# Patient Record
Sex: Male | Born: 1940 | Race: Black or African American | Hispanic: No | Marital: Married | Smoking: Former smoker
Health system: Southern US, Community
[De-identification: ages and names within clinical notes are randomized; demographics above are authoritative.]

## PROBLEM LIST (undated history)

## (undated) DIAGNOSIS — I4819 Other persistent atrial fibrillation: Secondary | ICD-10-CM

## (undated) DIAGNOSIS — F329 Major depressive disorder, single episode, unspecified: Secondary | ICD-10-CM

## (undated) DIAGNOSIS — Z950 Presence of cardiac pacemaker: Secondary | ICD-10-CM

## (undated) DIAGNOSIS — F3289 Other specified depressive episodes: Secondary | ICD-10-CM

## (undated) DIAGNOSIS — K219 Gastro-esophageal reflux disease without esophagitis: Secondary | ICD-10-CM

## (undated) DIAGNOSIS — J986 Disorders of diaphragm: Secondary | ICD-10-CM

## (undated) DIAGNOSIS — Z79899 Other long term (current) drug therapy: Secondary | ICD-10-CM

## (undated) DIAGNOSIS — I509 Heart failure, unspecified: Secondary | ICD-10-CM

## (undated) DIAGNOSIS — M545 Low back pain, unspecified: Secondary | ICD-10-CM

## (undated) DIAGNOSIS — K5730 Diverticulosis of large intestine without perforation or abscess without bleeding: Secondary | ICD-10-CM

## (undated) DIAGNOSIS — K621 Rectal polyp: Secondary | ICD-10-CM

## (undated) DIAGNOSIS — I2510 Atherosclerotic heart disease of native coronary artery without angina pectoris: Secondary | ICD-10-CM

## (undated) DIAGNOSIS — J309 Allergic rhinitis, unspecified: Secondary | ICD-10-CM

## (undated) DIAGNOSIS — R1013 Epigastric pain: Secondary | ICD-10-CM

## (undated) DIAGNOSIS — I495 Sick sinus syndrome: Secondary | ICD-10-CM

## (undated) DIAGNOSIS — Z7901 Long term (current) use of anticoagulants: Secondary | ICD-10-CM

## (undated) DIAGNOSIS — N400 Enlarged prostate without lower urinary tract symptoms: Secondary | ICD-10-CM

## (undated) DIAGNOSIS — I10 Essential (primary) hypertension: Secondary | ICD-10-CM

## (undated) DIAGNOSIS — M1990 Unspecified osteoarthritis, unspecified site: Secondary | ICD-10-CM

## (undated) DIAGNOSIS — E785 Hyperlipidemia, unspecified: Secondary | ICD-10-CM

## (undated) HISTORY — DX: Other specified depressive episodes: F32.89

## (undated) HISTORY — DX: Low back pain: M54.5

## (undated) HISTORY — DX: Epigastric pain: R10.13

## (undated) HISTORY — PX: PACEMAKER PLACEMENT: SHX43

## (undated) HISTORY — DX: Other persistent atrial fibrillation: I48.19

## (undated) HISTORY — DX: Essential (primary) hypertension: I10

## (undated) HISTORY — DX: Hyperlipidemia, unspecified: E78.5

## (undated) HISTORY — DX: Other long term (current) drug therapy: Z79.899

## (undated) HISTORY — DX: Diverticulosis of large intestine without perforation or abscess without bleeding: K57.30

## (undated) HISTORY — DX: Low back pain, unspecified: M54.50

## (undated) HISTORY — DX: Benign prostatic hyperplasia without lower urinary tract symptoms: N40.0

## (undated) HISTORY — DX: Rectal polyp: K62.1

## (undated) HISTORY — DX: Atherosclerotic heart disease of native coronary artery without angina pectoris: I25.10

## (undated) HISTORY — DX: Gastro-esophageal reflux disease without esophagitis: K21.9

## (undated) HISTORY — DX: Disorders of diaphragm: J98.6

## (undated) HISTORY — DX: Allergic rhinitis, unspecified: J30.9

## (undated) HISTORY — DX: Long term (current) use of anticoagulants: Z79.01

## (undated) HISTORY — DX: Major depressive disorder, single episode, unspecified: F32.9

## (undated) HISTORY — DX: Sick sinus syndrome: I49.5

---

## 1998-05-29 ENCOUNTER — Emergency Department (HOSPITAL_COMMUNITY): Admission: EM | Admit: 1998-05-29 | Discharge: 1998-05-29 | Payer: Self-pay | Admitting: Emergency Medicine

## 2004-05-26 ENCOUNTER — Inpatient Hospital Stay (HOSPITAL_COMMUNITY): Admission: EM | Admit: 2004-05-26 | Discharge: 2004-05-30 | Payer: Self-pay | Admitting: Emergency Medicine

## 2004-05-27 ENCOUNTER — Encounter (INDEPENDENT_AMBULATORY_CARE_PROVIDER_SITE_OTHER): Payer: Self-pay | Admitting: *Deleted

## 2004-05-28 ENCOUNTER — Encounter: Payer: Self-pay | Admitting: Cardiology

## 2004-07-30 ENCOUNTER — Ambulatory Visit (HOSPITAL_COMMUNITY): Admission: RE | Admit: 2004-07-30 | Discharge: 2004-07-30 | Payer: Self-pay | Admitting: Internal Medicine

## 2005-08-05 ENCOUNTER — Ambulatory Visit: Payer: Self-pay | Admitting: Internal Medicine

## 2006-10-25 ENCOUNTER — Ambulatory Visit: Payer: Self-pay | Admitting: Internal Medicine

## 2006-10-25 LAB — CONVERTED CEMR LAB
AST: 23 units/L (ref 0–37)
Alkaline Phosphatase: 57 units/L (ref 39–117)
BUN: 14 mg/dL (ref 6–23)
Basophils Relative: 0.9 % (ref 0.0–1.0)
Bilirubin Urine: NEGATIVE
CO2: 29 meq/L (ref 19–32)
Calcium: 9 mg/dL (ref 8.4–10.5)
Chloride: 108 meq/L (ref 96–112)
Cholesterol: 169 mg/dL (ref 0–200)
Eosinophil percent: 1.8 % (ref 0.0–5.0)
Glomerular Filtration Rate, Af Am: 126 mL/min/{1.73_m2}
Glucose, Bld: 86 mg/dL (ref 70–99)
Hemoglobin, Urine: NEGATIVE
Hemoglobin: 14.9 g/dL (ref 13.0–17.0)
LDL Cholesterol: 101 mg/dL — ABNORMAL HIGH (ref 0–99)
Lymphocytes Relative: 32.3 % (ref 12.0–46.0)
Monocytes Absolute: 0.7 10*3/uL (ref 0.2–0.7)
Neutro Abs: 3.3 10*3/uL (ref 1.4–7.7)
PSA: 0.6 ng/mL (ref 0.10–4.00)
Platelets: 204 10*3/uL (ref 150–400)
RBC: 5 M/uL (ref 4.22–5.81)
TSH: 0.89 microintl units/mL (ref 0.35–5.50)
Total Bilirubin: 0.9 mg/dL (ref 0.3–1.2)
Triglyceride fasting, serum: 31 mg/dL (ref 0–149)
VLDL: 6 mg/dL (ref 0–40)
pH: 7 (ref 5.0–8.0)

## 2007-11-15 ENCOUNTER — Ambulatory Visit: Payer: Self-pay | Admitting: Internal Medicine

## 2007-11-15 ENCOUNTER — Telehealth: Payer: Self-pay | Admitting: Internal Medicine

## 2007-11-29 ENCOUNTER — Ambulatory Visit: Payer: Self-pay | Admitting: Internal Medicine

## 2007-11-29 ENCOUNTER — Encounter: Payer: Self-pay | Admitting: Internal Medicine

## 2008-05-30 ENCOUNTER — Ambulatory Visit: Payer: Self-pay | Admitting: Internal Medicine

## 2008-05-30 DIAGNOSIS — J309 Allergic rhinitis, unspecified: Secondary | ICD-10-CM | POA: Insufficient documentation

## 2008-05-30 DIAGNOSIS — N318 Other neuromuscular dysfunction of bladder: Secondary | ICD-10-CM | POA: Insufficient documentation

## 2008-05-30 DIAGNOSIS — K219 Gastro-esophageal reflux disease without esophagitis: Secondary | ICD-10-CM | POA: Insufficient documentation

## 2008-05-30 DIAGNOSIS — F329 Major depressive disorder, single episode, unspecified: Secondary | ICD-10-CM

## 2008-05-30 DIAGNOSIS — M545 Low back pain, unspecified: Secondary | ICD-10-CM | POA: Insufficient documentation

## 2008-05-30 DIAGNOSIS — K5730 Diverticulosis of large intestine without perforation or abscess without bleeding: Secondary | ICD-10-CM | POA: Insufficient documentation

## 2008-05-30 DIAGNOSIS — I10 Essential (primary) hypertension: Secondary | ICD-10-CM | POA: Insufficient documentation

## 2008-05-30 DIAGNOSIS — F32A Depression, unspecified: Secondary | ICD-10-CM | POA: Insufficient documentation

## 2008-05-30 DIAGNOSIS — E785 Hyperlipidemia, unspecified: Secondary | ICD-10-CM | POA: Insufficient documentation

## 2008-05-31 LAB — CONVERTED CEMR LAB
AST: 23 units/L (ref 0–37)
BUN: 13 mg/dL (ref 6–23)
Basophils Relative: 0 % (ref 0.0–1.0)
Calcium: 9.4 mg/dL (ref 8.4–10.5)
Cholesterol: 123 mg/dL (ref 0–200)
Eosinophils Absolute: 0.1 10*3/uL (ref 0.0–0.7)
Eosinophils Relative: 1.5 % (ref 0.0–5.0)
GFR calc Af Amer: 125 mL/min
GFR calc non Af Amer: 103 mL/min
Glucose, Bld: 90 mg/dL (ref 70–99)
HCT: 42.9 % (ref 39.0–52.0)
Hemoglobin, Urine: NEGATIVE
LDL Cholesterol: 67 mg/dL (ref 0–99)
Monocytes Absolute: 0.7 10*3/uL (ref 0.1–1.0)
Platelets: 200 10*3/uL (ref 150–400)
Potassium: 3.7 meq/L (ref 3.5–5.1)
RBC: 4.79 M/uL (ref 4.22–5.81)
Sodium: 143 meq/L (ref 135–145)
Specific Gravity, Urine: >=1.03 (ref 1.000–1.03)
Squamous Epithelial / LPF: NEGATIVE /lpf
TSH: 0.85 microintl units/mL (ref 0.35–5.50)
Total CHOL/HDL Ratio: 2.5
Urobilinogen, UA: 1 (ref 0.0–1.0)
pH: 6 (ref 5.0–8.0)

## 2008-07-06 ENCOUNTER — Ambulatory Visit: Payer: Self-pay | Admitting: Internal Medicine

## 2008-07-06 LAB — CONVERTED CEMR LAB: PSA: 0.96 ng/mL (ref 0.10–4.00)

## 2008-09-10 ENCOUNTER — Telehealth: Payer: Self-pay | Admitting: Internal Medicine

## 2009-02-12 ENCOUNTER — Telehealth: Payer: Self-pay | Admitting: Internal Medicine

## 2009-05-07 ENCOUNTER — Telehealth (INDEPENDENT_AMBULATORY_CARE_PROVIDER_SITE_OTHER): Payer: Self-pay | Admitting: *Deleted

## 2009-05-16 DIAGNOSIS — I495 Sick sinus syndrome: Secondary | ICD-10-CM

## 2009-05-16 HISTORY — DX: Sick sinus syndrome: I49.5

## 2009-05-16 HISTORY — PX: INSERT / REPLACE / REMOVE PACEMAKER: SUR710

## 2009-05-20 ENCOUNTER — Encounter: Payer: Self-pay | Admitting: Internal Medicine

## 2009-05-27 ENCOUNTER — Ambulatory Visit: Payer: Self-pay | Admitting: Internal Medicine

## 2009-05-27 DIAGNOSIS — R5381 Other malaise: Secondary | ICD-10-CM | POA: Insufficient documentation

## 2009-05-27 DIAGNOSIS — I498 Other specified cardiac arrhythmias: Secondary | ICD-10-CM | POA: Insufficient documentation

## 2009-05-27 DIAGNOSIS — R5383 Other fatigue: Secondary | ICD-10-CM | POA: Insufficient documentation

## 2009-05-27 LAB — CONVERTED CEMR LAB
AST: 26 units/L (ref 0–37)
Alkaline Phosphatase: 64 units/L (ref 39–117)
BUN: 13 mg/dL (ref 6–23)
CO2: 28 meq/L (ref 19–32)
Calcium: 9.4 mg/dL (ref 8.4–10.5)
Creatinine, Ser: 0.8 mg/dL (ref 0.4–1.5)
Eosinophils Relative: 1.1 % (ref 0.0–5.0)
GFR calc non Af Amer: 124.34 mL/min (ref >60)
Glucose, Bld: 103 mg/dL — ABNORMAL HIGH (ref 70–99)
HDL: 61.3 mg/dL (ref >39.00)
LDL Cholesterol: 102 mg/dL — ABNORMAL HIGH (ref 0–99)
Lymphocytes Relative: 26.7 % (ref 12.0–46.0)
Lymphs Abs: 1.9 10*3/uL (ref 0.7–4.0)
MCV: 91.7 fL (ref 78.0–100.0)
Neutrophils Relative %: 63.1 % (ref 43.0–77.0)
Nitrite: NEGATIVE
Platelets: 174 10*3/uL (ref 150.0–400.0)
Sodium: 143 meq/L (ref 135–145)
Total CHOL/HDL Ratio: 3
Total Protein, Urine: NEGATIVE mg/dL
Total Protein: 7.3 g/dL (ref 6.0–8.3)
Urine Glucose: NEGATIVE mg/dL
Urobilinogen, UA: 1 (ref 0.0–1.0)
WBC: 7.2 10*3/uL (ref 4.5–10.5)
pH: 6 (ref 5.0–8.0)

## 2009-05-30 ENCOUNTER — Telehealth: Payer: Self-pay | Admitting: Internal Medicine

## 2009-06-01 ENCOUNTER — Ambulatory Visit: Payer: Self-pay | Admitting: Radiology

## 2009-06-01 ENCOUNTER — Inpatient Hospital Stay (HOSPITAL_COMMUNITY): Admission: EM | Admit: 2009-06-01 | Discharge: 2009-06-04 | Payer: Self-pay | Admitting: Emergency Medicine

## 2009-06-01 ENCOUNTER — Encounter: Payer: Self-pay | Admitting: Emergency Medicine

## 2009-06-01 ENCOUNTER — Ambulatory Visit: Payer: Self-pay | Admitting: *Deleted

## 2009-06-03 ENCOUNTER — Encounter: Payer: Self-pay | Admitting: Cardiovascular Disease

## 2009-06-04 ENCOUNTER — Telehealth: Payer: Self-pay | Admitting: Internal Medicine

## 2009-06-05 ENCOUNTER — Ambulatory Visit: Payer: Self-pay | Admitting: Gastroenterology

## 2009-06-05 DIAGNOSIS — R634 Abnormal weight loss: Secondary | ICD-10-CM | POA: Insufficient documentation

## 2009-06-05 DIAGNOSIS — Z860100 Personal history of colon polyps, unspecified: Secondary | ICD-10-CM | POA: Insufficient documentation

## 2009-06-05 DIAGNOSIS — R110 Nausea: Secondary | ICD-10-CM | POA: Insufficient documentation

## 2009-06-07 ENCOUNTER — Ambulatory Visit: Payer: Self-pay | Admitting: Internal Medicine

## 2009-06-10 LAB — CONVERTED CEMR LAB
Amylase: 69 units/L (ref 27–131)
Basophils Relative: 0.5 % (ref 0.0–3.0)
Eosinophils Absolute: 0 10*3/uL (ref 0.0–0.7)
Lipase: 12 units/L (ref 11.0–59.0)
Lymphocytes Relative: 19.8 % (ref 12.0–46.0)
MCHC: 34 g/dL (ref 30.0–36.0)
MCV: 92.3 fL (ref 78.0–100.0)
Neutrophils Relative %: 69.1 % (ref 43.0–77.0)
Platelets: 171 10*3/uL (ref 150.0–400.0)

## 2009-06-11 ENCOUNTER — Telehealth (INDEPENDENT_AMBULATORY_CARE_PROVIDER_SITE_OTHER): Payer: Self-pay | Admitting: *Deleted

## 2009-06-11 ENCOUNTER — Telehealth: Payer: Self-pay | Admitting: Internal Medicine

## 2009-06-19 ENCOUNTER — Encounter: Payer: Self-pay | Admitting: Internal Medicine

## 2009-06-19 ENCOUNTER — Ambulatory Visit: Payer: Self-pay

## 2009-06-19 ENCOUNTER — Ambulatory Visit: Payer: Self-pay | Admitting: Internal Medicine

## 2009-06-26 ENCOUNTER — Ambulatory Visit: Payer: Self-pay | Admitting: Internal Medicine

## 2009-06-26 DIAGNOSIS — N400 Enlarged prostate without lower urinary tract symptoms: Secondary | ICD-10-CM | POA: Insufficient documentation

## 2009-07-08 ENCOUNTER — Encounter: Payer: Self-pay | Admitting: Internal Medicine

## 2009-07-17 ENCOUNTER — Ambulatory Visit (HOSPITAL_COMMUNITY): Admission: RE | Admit: 2009-07-17 | Discharge: 2009-07-17 | Payer: Self-pay | Admitting: Internal Medicine

## 2009-08-07 ENCOUNTER — Encounter (INDEPENDENT_AMBULATORY_CARE_PROVIDER_SITE_OTHER): Payer: Self-pay | Admitting: *Deleted

## 2009-10-16 ENCOUNTER — Telehealth (INDEPENDENT_AMBULATORY_CARE_PROVIDER_SITE_OTHER): Payer: Self-pay | Admitting: *Deleted

## 2009-10-16 ENCOUNTER — Ambulatory Visit: Payer: Self-pay | Admitting: Internal Medicine

## 2009-10-21 ENCOUNTER — Telehealth: Payer: Self-pay | Admitting: Internal Medicine

## 2009-10-25 ENCOUNTER — Ambulatory Visit: Payer: Self-pay | Admitting: Internal Medicine

## 2009-11-27 ENCOUNTER — Telehealth: Payer: Self-pay | Admitting: Internal Medicine

## 2009-12-09 ENCOUNTER — Ambulatory Visit: Payer: Self-pay | Admitting: Internal Medicine

## 2009-12-09 DIAGNOSIS — M25519 Pain in unspecified shoulder: Secondary | ICD-10-CM | POA: Insufficient documentation

## 2009-12-09 DIAGNOSIS — M7660 Achilles tendinitis, unspecified leg: Secondary | ICD-10-CM | POA: Insufficient documentation

## 2009-12-23 ENCOUNTER — Telehealth: Payer: Self-pay | Admitting: Internal Medicine

## 2009-12-24 ENCOUNTER — Telehealth: Payer: Self-pay | Admitting: Internal Medicine

## 2009-12-25 ENCOUNTER — Ambulatory Visit: Payer: Self-pay | Admitting: Internal Medicine

## 2009-12-25 LAB — CONVERTED CEMR LAB
ALT: 26 units/L (ref 0–53)
Alkaline Phosphatase: 77 units/L (ref 39–117)
Basophils Relative: 0.6 % (ref 0.0–3.0)
Bilirubin Urine: NEGATIVE
Bilirubin, Direct: 0.1 mg/dL (ref 0.0–0.3)
CO2: 31 meq/L (ref 19–32)
Chloride: 101 meq/L (ref 96–112)
Creatinine, Ser: 0.8 mg/dL (ref 0.4–1.5)
Folate: 10.5 ng/mL
Glucose, Bld: 91 mg/dL (ref 70–99)
HDL: 72 mg/dL (ref >39.00)
Iron: 112 ug/dL (ref 42–165)
LDL Cholesterol: 88 mg/dL (ref 0–99)
Monocytes Relative: 7.5 % (ref 3.0–12.0)
Neutro Abs: 4.2 10*3/uL (ref 1.4–7.7)
Neutrophils Relative %: 57.5 % (ref 43.0–77.0)
Platelets: 211 10*3/uL (ref 150.0–400.0)
RDW: 12.2 % (ref 11.5–14.6)
Saturation Ratios: 34.1 % (ref 20.0–50.0)
Sed Rate: 19 mm/hr (ref 0–22)
Specific Gravity, Urine: 1.015 (ref 1.000–1.030)
TSH: 1.17 microintl units/mL (ref 0.35–5.50)
Total CHOL/HDL Ratio: 2
Total Protein: 8.5 g/dL — ABNORMAL HIGH (ref 6.0–8.3)
Triglycerides: 36 mg/dL (ref 0.0–149.0)
VLDL: 7.2 mg/dL (ref 0.0–40.0)

## 2009-12-26 ENCOUNTER — Encounter: Payer: Self-pay | Admitting: Internal Medicine

## 2010-01-13 ENCOUNTER — Ambulatory Visit: Payer: Self-pay | Admitting: Internal Medicine

## 2010-01-20 ENCOUNTER — Ambulatory Visit: Payer: Self-pay | Admitting: Internal Medicine

## 2010-01-23 LAB — CONVERTED CEMR LAB: Testosterone: 413.5 ng/dL (ref 350.00–890.00)

## 2010-02-11 ENCOUNTER — Encounter: Payer: Self-pay | Admitting: Internal Medicine

## 2010-02-17 ENCOUNTER — Telehealth: Payer: Self-pay | Admitting: Internal Medicine

## 2010-03-16 ENCOUNTER — Encounter: Payer: Self-pay | Admitting: Emergency Medicine

## 2010-03-16 ENCOUNTER — Ambulatory Visit: Payer: Self-pay | Admitting: Radiology

## 2010-03-17 ENCOUNTER — Observation Stay (HOSPITAL_COMMUNITY): Admission: EM | Admit: 2010-03-17 | Discharge: 2010-03-17 | Payer: Self-pay | Admitting: Internal Medicine

## 2010-03-18 ENCOUNTER — Telehealth: Payer: Self-pay | Admitting: Internal Medicine

## 2010-03-25 ENCOUNTER — Ambulatory Visit: Payer: Self-pay | Admitting: Internal Medicine

## 2010-03-25 DIAGNOSIS — M6282 Rhabdomyolysis: Secondary | ICD-10-CM | POA: Insufficient documentation

## 2010-03-25 LAB — CONVERTED CEMR LAB
BUN: 13 mg/dL (ref 6–23)
CO2: 32 meq/L (ref 19–32)
Calcium: 9.7 mg/dL (ref 8.4–10.5)
Creatinine, Ser: 0.9 mg/dL (ref 0.4–1.5)
Glucose, Bld: 98 mg/dL (ref 70–99)
Total CK: 232 units/L (ref 7–232)

## 2010-04-21 ENCOUNTER — Telehealth: Payer: Self-pay | Admitting: Internal Medicine

## 2010-05-20 ENCOUNTER — Ambulatory Visit: Payer: Self-pay | Admitting: Internal Medicine

## 2010-05-20 ENCOUNTER — Telehealth: Payer: Self-pay | Admitting: Internal Medicine

## 2010-05-20 DIAGNOSIS — R112 Nausea with vomiting, unspecified: Secondary | ICD-10-CM | POA: Insufficient documentation

## 2010-05-20 DIAGNOSIS — I495 Sick sinus syndrome: Secondary | ICD-10-CM | POA: Insufficient documentation

## 2010-05-20 HISTORY — DX: Nausea with vomiting, unspecified: R11.2

## 2010-05-20 LAB — CONVERTED CEMR LAB
ALT: 23 units/L (ref 0–53)
AST: 34 units/L (ref 0–37)
Alkaline Phosphatase: 57 units/L (ref 39–117)
BUN: 19 mg/dL (ref 6–23)
Basophils Relative: 1.1 % (ref 0.0–3.0)
Bilirubin Urine: NEGATIVE
Bilirubin, Direct: 0.2 mg/dL (ref 0.0–0.3)
Chloride: 103 meq/L (ref 96–112)
Eosinophils Absolute: 0 10*3/uL (ref 0.0–0.7)
GFR calc non Af Amer: 108.22 mL/min (ref >60)
Leukocytes, UA: NEGATIVE
Lymphocytes Relative: 21.1 % (ref 12.0–46.0)
Lymphs Abs: 2.7 10*3/uL (ref 0.7–4.0)
MCV: 90.9 fL (ref 78.0–100.0)
Monocytes Absolute: 1.5 10*3/uL — ABNORMAL HIGH (ref 0.1–1.0)
Nitrite: NEGATIVE
Potassium: 4.2 meq/L (ref 3.5–5.1)
RDW: 13.7 % (ref 11.5–14.6)
Sodium: 140 meq/L (ref 135–145)
Total Bilirubin: 1.1 mg/dL (ref 0.3–1.2)
Total Protein: 7.7 g/dL (ref 6.0–8.3)
WBC: 12.9 10*3/uL — ABNORMAL HIGH (ref 4.5–10.5)
pH: 6 (ref 5.0–8.0)

## 2010-05-22 ENCOUNTER — Encounter: Payer: Self-pay | Admitting: Internal Medicine

## 2010-05-27 ENCOUNTER — Telehealth: Payer: Self-pay | Admitting: Internal Medicine

## 2010-05-29 ENCOUNTER — Ambulatory Visit: Payer: Self-pay | Admitting: Internal Medicine

## 2010-05-29 DIAGNOSIS — I4891 Unspecified atrial fibrillation: Secondary | ICD-10-CM | POA: Insufficient documentation

## 2010-06-10 ENCOUNTER — Ambulatory Visit: Payer: Self-pay | Admitting: Internal Medicine

## 2010-07-30 ENCOUNTER — Telehealth: Payer: Self-pay | Admitting: Internal Medicine

## 2010-08-21 ENCOUNTER — Ambulatory Visit: Payer: Self-pay | Admitting: Internal Medicine

## 2010-08-21 DIAGNOSIS — M542 Cervicalgia: Secondary | ICD-10-CM | POA: Insufficient documentation

## 2010-08-26 ENCOUNTER — Telehealth: Payer: Self-pay | Admitting: Internal Medicine

## 2010-10-21 ENCOUNTER — Ambulatory Visit: Payer: Self-pay | Admitting: Internal Medicine

## 2010-11-24 ENCOUNTER — Ambulatory Visit
Admission: RE | Admit: 2010-11-24 | Discharge: 2010-11-24 | Payer: Self-pay | Source: Home / Self Care | Attending: Internal Medicine | Admitting: Internal Medicine

## 2010-11-24 ENCOUNTER — Other Ambulatory Visit: Payer: Self-pay | Admitting: Internal Medicine

## 2010-11-24 ENCOUNTER — Encounter: Payer: Self-pay | Admitting: Internal Medicine

## 2010-11-24 DIAGNOSIS — R350 Frequency of micturition: Secondary | ICD-10-CM | POA: Insufficient documentation

## 2010-11-24 LAB — URINALYSIS, ROUTINE W REFLEX MICROSCOPIC
Bilirubin Urine: NEGATIVE
Hemoglobin, Urine: NEGATIVE
Ketones, ur: NEGATIVE
Leukocytes, UA: NEGATIVE
Nitrite: NEGATIVE
Specific Gravity, Urine: 1.025 (ref 1.000–1.030)
Total Protein, Urine: NEGATIVE
Urine Glucose: NEGATIVE
Urobilinogen, UA: 0.2 (ref 0.0–1.0)
pH: 6 (ref 5.0–8.0)

## 2010-11-24 LAB — CBC WITH DIFFERENTIAL/PLATELET
Basophils Absolute: 0 10*3/uL (ref 0.0–0.1)
Basophils Relative: 0.4 % (ref 0.0–3.0)
Eosinophils Absolute: 0.1 10*3/uL (ref 0.0–0.7)
Eosinophils Relative: 1.8 % (ref 0.0–5.0)
HCT: 46.4 % (ref 39.0–52.0)
Hemoglobin: 15.6 g/dL (ref 13.0–17.0)
Lymphocytes Relative: 27.8 % (ref 12.0–46.0)
Lymphs Abs: 2.1 10*3/uL (ref 0.7–4.0)
MCHC: 33.6 g/dL (ref 30.0–36.0)
MCV: 90.1 fl (ref 78.0–100.0)
Monocytes Absolute: 0.7 10*3/uL (ref 0.1–1.0)
Monocytes Relative: 9 % (ref 3.0–12.0)
Neutro Abs: 4.6 10*3/uL (ref 1.4–7.7)
Neutrophils Relative %: 61 % (ref 43.0–77.0)
Platelets: 180 10*3/uL (ref 150.0–400.0)
RBC: 5.15 Mil/uL (ref 4.22–5.81)
RDW: 13.7 % (ref 11.5–14.6)
WBC: 7.5 10*3/uL (ref 4.5–10.5)

## 2010-11-24 LAB — LIPID PANEL
Cholesterol: 243 mg/dL — ABNORMAL HIGH (ref 0–200)
HDL: 104.8 mg/dL (ref >39.00)
Total CHOL/HDL Ratio: 2
Triglycerides: 59 mg/dL (ref 0.0–149.0)
VLDL: 11.8 mg/dL (ref 0.0–40.0)

## 2010-11-24 LAB — BASIC METABOLIC PANEL
BUN: 18 mg/dL (ref 6–23)
CO2: 25 mEq/L (ref 19–32)
Calcium: 9.9 mg/dL (ref 8.4–10.5)
Chloride: 105 mEq/L (ref 96–112)
Creatinine, Ser: 0.9 mg/dL (ref 0.4–1.5)
GFR: 102.76 mL/min (ref >60.00)
Glucose, Bld: 102 mg/dL — ABNORMAL HIGH (ref 70–99)
Potassium: 4.4 mEq/L (ref 3.5–5.1)
Sodium: 140 mEq/L (ref 135–145)

## 2010-11-24 LAB — LDL CHOLESTEROL, DIRECT: Direct LDL: 121.6 mg/dL

## 2010-11-24 LAB — TSH: TSH: 1.98 u[IU]/mL (ref 0.35–5.50)

## 2010-11-24 LAB — HEPATIC FUNCTION PANEL
ALT: 33 U/L (ref 0–53)
AST: 27 U/L (ref 0–37)
Albumin: 4.2 g/dL (ref 3.5–5.2)
Alkaline Phosphatase: 64 U/L (ref 39–117)
Bilirubin, Direct: 0.1 mg/dL (ref 0.0–0.3)
Total Bilirubin: 0.6 mg/dL (ref 0.3–1.2)
Total Protein: 7.5 g/dL (ref 6.0–8.3)

## 2010-11-24 LAB — PSA: PSA: 0.93 ng/mL (ref 0.10–4.00)

## 2010-11-25 ENCOUNTER — Encounter: Payer: Self-pay | Admitting: Internal Medicine

## 2010-11-26 ENCOUNTER — Ambulatory Visit
Admission: RE | Admit: 2010-11-26 | Discharge: 2010-11-26 | Payer: Self-pay | Source: Home / Self Care | Attending: Internal Medicine | Admitting: Internal Medicine

## 2010-11-26 ENCOUNTER — Encounter: Payer: Self-pay | Admitting: Internal Medicine

## 2010-12-02 ENCOUNTER — Telehealth: Payer: Self-pay | Admitting: Endocrinology

## 2010-12-02 ENCOUNTER — Telehealth: Payer: Self-pay | Admitting: Internal Medicine

## 2010-12-03 ENCOUNTER — Telehealth: Payer: Self-pay | Admitting: Internal Medicine

## 2010-12-04 ENCOUNTER — Encounter: Payer: Self-pay | Admitting: Internal Medicine

## 2010-12-04 ENCOUNTER — Ambulatory Visit
Admission: RE | Admit: 2010-12-04 | Discharge: 2010-12-04 | Payer: Self-pay | Source: Home / Self Care | Attending: Internal Medicine | Admitting: Internal Medicine

## 2010-12-04 DIAGNOSIS — R55 Syncope and collapse: Secondary | ICD-10-CM | POA: Insufficient documentation

## 2010-12-05 ENCOUNTER — Ambulatory Visit: Payer: Self-pay | Admitting: Cardiology

## 2010-12-05 ENCOUNTER — Telehealth: Payer: Self-pay | Admitting: Internal Medicine

## 2010-12-09 ENCOUNTER — Encounter (INDEPENDENT_AMBULATORY_CARE_PROVIDER_SITE_OTHER): Payer: Self-pay | Admitting: *Deleted

## 2010-12-16 NOTE — Progress Notes (Signed)
Summary: med list update  Phone Note Other Incoming   Summary of Call: Per request of Pos-T-Vac added vacum device to patient med list. ([employer] [facility]--Paperwork has been scanned). Initial call taken by: Lucious Groves,  February 17, 2010 8:49 AM    New/Updated Medications: * VACUME ERECTION DEVICE (POS-T-VAC) use as directed Prescriptions: VACUME ERECTION DEVICE (POS-T-VAC) use as directed  #1 x 0   Entered by:   Lucious Groves   Authorized by:   Corwin Levins MD   Signed by:   Lucious Groves on 02/17/2010   Method used:   Historical   RxID:   [id number]

## 2010-12-16 NOTE — Assessment & Plan Note (Signed)
Summary: rov/weak/low pulse  Medications Added AMLODIPINE BESYLATE 5 MG TABS (AMLODIPINE BESYLATE) Take one tablet by mouth daily      Allergies Added: NKDA  Visit Type:  Follow-up Referring Provider:  n/a Primary Provider:  Bayard Males  CC:  Athritis/Tendonitis.  History of Present Illness: The patient presents today for routine electrophysiology followup. He reports doing very well since last being seen in our clinic.  His primary concern today is "tendenitis" in his ankles and shoulder. The patient also reports fatigue and decreased energy.  The patient denies symptoms of palpitations, chest pain, shortness of breath, orthopnea, PND, lower extremity edema, dizziness, presyncope, syncope, or neurologic sequela. The patient is tolerating medications without difficulties and is otherwise without complaint today.   Current Medications (verified): 1)  Simvastatin 40 Mg Tabs (Simvastatin) .Marland Kitchen.. 1po Once Daily 2)  Cialis 20 Mg Tabs (Tadalafil) .Marland Kitchen.. 1 By Mouth Once Daily As Needed 3)  Protonix 40 Mg Tbec (Pantoprazole Sodium) .Marland Kitchen.. 1 By Mouth Two Times A Day 4)  Vesicare 10 Mg Tabs (Solifenacin Succinate) .Marland Kitchen.. 1 By Mouth Once Daily 5)  Cetirizine Hcl 10 Mg  Tabs (Cetirizine Hcl) .Marland Kitchen.. 1 By Mouth Once Daily As Needed Allergies 6)  Aspir-Low 81 Mg Tbec (Aspirin) .Marland Kitchen.. 1 By Mouth Once Daily 7)  Lisinopril 20 Mg Tabs (Lisinopril) .Marland Kitchen.. 1po Once Daily 8)  Tramadol Hcl 50 Mg Tabs (Tramadol Hcl) .Marland Kitchen.. 1 By Mouth Q 6 Hrs As Needed 9)  Amlodipine Besylate 5 Mg Tabs (Amlodipine Besylate) .... Take One Tablet By Mouth Daily 10)  Citalopram Hydrobromide 10 Mg Tabs (Citalopram Hydrobromide) .Marland Kitchen.. 1po Once Daily  Allergies (verified): No Known Drug Allergies  Past History:  Past Medical History: BRADYCARDIA (ICD-427.89) HYPERTENSION (ICD-401.9) HYPERLIPIDEMIA (ICD-272.4) NEPHROLITHIASIS, HX OF (ICD-V13.01) BENIGN PROSTATIC HYPERTROPHY (ICD-600.00) PERSONAL HX COLONIC POLYPS  (ICD-V12.72) DIVERTICULOSIS-COLON (ICD-562.10) ANOREXIA (ICD-783.0) GERD (ICD-530.81) WEIGHT LOSS-ABNORMAL (ICD-783.21) NAUSEA (ICD-787.02) ABDOMINAL PAIN-EPIGASTRIC (ICD-789.06) PANCREATITIS, HX OF (ICD-V12.70) EPIGASTRIC PAIN (ICD-789.06) EPIGASTRIC PAIN (ICD-789.06) FATIGUE (ICD-780.79) DEPRESSION (ICD-311) DIVERTICULOSIS, COLON (ICD-562.10) LOW BACK PAIN (ICD-724.2) GERD (ICD-530.81) ALLERGIC RHINITIS (ICD-477.9) OVERACTIVE BLADDER (ICD-596.51) PREVENTIVE HEALTH CARE (ICD-V70.0)    Past Surgical History: Reviewed history from 06/05/2009 and no changes required. pacemaker 05/2009  Social History: Reviewed history from 12/25/2009 and no changes required. retired - [employer] Former Smoker Alcohol use-no Married 3 children - 1 deceased with homicide Illicit Drug Use - no  Review of Systems       All systems are reviewed and negative except as listed in the HPI.   Vital Signs:  Patient profile:   70 year old male Height:      69 inches Weight:      174 pounds BMI:     25.79 Pulse rate:   60 / minute BP sitting:   160 / 100  (left arm)  Vitals Entered By: Laurance Flatten CMA (January 13, 2010 10:32 AM)  Physical Exam  General:  alert and overweight-appearing.   Head:  normocephalic and atraumatic.   Eyes:  vision grossly intact, pupils equal, and pupils round.   Mouth:  no gingival abnormalities and pharynx pink and moist.   Neck:  Neck supple, no JVD. No masses, thyromegaly or abnormal cervical nodes. Chest Wall:  pacemaker site is well healed Lungs:  Clear bilaterally to auscultation and percussion. Heart:  Non-displaced PMI, chest non-tender; regular rate and rhythm, S1, S2 without murmurs, rubs or gallops. Carotid upstroke normal, no bruit. Normal abdominal aortic size, no bruits. Femorals normal pulses, no bruits. Pedals normal pulses. No  edema, no varicosities. Abdomen:  Bowel sounds positive; abdomen soft and non-tender without masses, organomegaly, or  hernias noted. No hepatosplenomegaly. Msk:  Back normal, normal gait. Muscle strength and tone normal. Pulses:  pulses normal in all 4 extremities Extremities:  No clubbing or cyanosis. Neurologic:  Alert and oriented x 3. Skin:  Intact without lesions or rashes. Cervical Nodes:  no significant adenopathy Psych:  Normal affect.   PPM Specifications Following MD:  Hillis Range, MD     PPM Vendor:  [facility] [employer]     [facility] Serial Number:  [id number] PPM DOI:  06/03/2009     PPM Implanting MD:  Hillis Range, MD Research Study Name Sinus node dysfunction/[place] [employer]  Lead 1    Location: RA     DOI: 06/03/2009     Model #: [id number]     Serial #: [id number]     Status: active Lead 2    Location: RV     DOI: 06/03/2009     Model #: 4137     Serial #: [id number]     Status: active  Magnet Response Rate:  BOL 100 ERI 85  Indications:  Sick sinus syndrome   PPM Follow Up Remote Check?  No Battery Voltage:  BOL V     Battery Est. Longevity:  >5 YEARS     Pacer Dependent:  No       PPM Device Measurements Atrium  Amplitude: 3.2 mV, Impedance: 500 ohms, Threshold: 0.6 V at 0.4 msec Right Ventricle  Amplitude: 8.3 mV, Impedance: 740 ohms, Threshold: 0.5 V at 0.4 msec  Episodes MS Episodes:  0     Coumadin:  No Ventricular High Rate:  1     Atrial Pacing:  66%     Ventricular Pacing:  0%  Parameters Mode:  DDDR     Lower Rate Limit:  60     Upper Rate Limit:  150 Paced AV Delay:  400     Sensed AV Delay:  100 Next Cardiology Appt Due:  05/16/2010 Tech Comments:  Normal device function.  VHR episode is likely short run of SVT.  Sensor adjusted today to allow for more adequate rate response.  No other changes made.  Gypsy Balsam RN BSN  January 13, 2010 10:37 AM  MD Comments:  blunted heart rate response.  Will adjust rate response today.  Impression & Recommendations:  Problem # 1:  BRADYCARDIA (ICD-427.89) Stable we will adjust rate response as above  Problem # 2:  FATIGUE  (ICD-780.79) pacemaker adjusted as above we will check testosterone level today other labs reviewed Orders: TLB-Testosterone, Total ([id number])  Problem # 3:  HYPERTENSION (ICD-401.9) above goal salt restriction advised His updated medication list for this problem includes:    Aspir-low 81 Mg Tbec (Aspirin) .Marland Kitchen... 1 by mouth once daily    Lisinopril 20 Mg Tabs (Lisinopril) .Marland Kitchen... 1po once daily    Amlodipine Besylate 5 Mg Tabs (Amlodipine besylate) .Marland Kitchen... Take one tablet by mouth daily  Problem # 4:  HYPERLIPIDEMIA (ICD-272.4) stable consider stopping statin if diffuse joint pain does not improve will defer to PCP.  His updated medication list for this problem includes:    Simvastatin 40 Mg Tabs (Simvastatin) .Marland Kitchen... 1po once daily  Patient Instructions: 1)  Your physician recommends that you schedule a follow-up appointment in: 3 months with Dr Johney Frame

## 2010-12-16 NOTE — Assessment & Plan Note (Signed)
Summary: back & leg pain/cd   Vital Signs:  Patient profile:   70 year old male Height:      69 inches Weight:      176 pounds BMI:     26.08 O2 Sat:      98 % on Room air Temp:     98.2 degrees F oral Pulse rate:   60 / minute BP sitting:   130 / 90  (left arm) Cuff size:   regular  Vitals Entered ByMarland Kitchen Zella Ball Ewing (December 09, 2009 10:54 AM)  O2 Flow:  Room air CC: right ankle, back and shoulder pain/RE   Primary Care Provider:  Bayard Males  CC:  right ankle and back and shoulder pain/RE.  History of Present Illness: here with 1 wk ongoign mutliple MSK compalints seemingly ongoing for unclear reasons at the same time and actually all improved to mild severlty today;  most pain seems to be the right achilles tendon area without swelling, trauma or redness and denies overusee/standing, walking;  also with right shoulder aching but today has FROM and shoulder is not tender;  also c/o lower lumbar pain across the lower back without being more specific and without bowel or bladder change, LE symptosm , fever, falls , night sweats or wt loss; .  Pt denies CP, sob, doe, wheezing, orthopnea, pnd, worsening LE edema, palps, dizziness or syncope   Pt denies new neuro symptoms such as headache, facial or extremity weakness   Problems Prior to Update: 1)  Back Pain  (ICD-724.5) 2)  Bradycardia  (ICD-427.89) 3)  Hypertension  (ICD-401.9) 4)  Hyperlipidemia  (ICD-272.4) 5)  Nephrolithiasis, Hx of  (ICD-V13.01) 6)  Benign Prostatic Hypertrophy  (ICD-600.00) 7)  Personal Hx Colonic Polyps  (ICD-V12.72) 8)  Diverticulosis-colon  (ICD-562.10) 9)  Anorexia  (ICD-783.0) 10)  Gerd  (ICD-530.81) 11)  Weight Loss-abnormal  (ICD-783.21) 12)  Nausea  (ICD-787.02) 13)  Abdominal Pain-epigastric  (ICD-789.06) 14)  Pancreatitis, Hx of  (ICD-V12.70) 15)  Epigastric Pain  (ICD-789.06) 16)  Epigastric Pain  (ICD-789.06) 17)  Fatigue  (ICD-780.79) 18)  Depression  (ICD-311) 19)  Diverticulosis,  Colon  (ICD-562.10) 20)  Low Back Pain  (ICD-724.2) 21)  Gerd  (ICD-530.81) 22)  Allergic Rhinitis  (ICD-477.9) 23)  Overactive Bladder  (ICD-596.51) 24)  Preventive Health Care  (ICD-V70.0)  Medications Prior to Update: 1)  Simvastatin 40 Mg Tabs (Simvastatin) .Marland Kitchen.. 1po Once Daily 2)  Cialis 20 Mg Tabs (Tadalafil) .Marland Kitchen.. 1 By Mouth Once Daily As Needed 3)  Protonix 40 Mg Tbec (Pantoprazole Sodium) .Marland Kitchen.. 1 By Mouth Two Times A Day 4)  Vesicare 10 Mg Tabs (Solifenacin Succinate) .Marland Kitchen.. 1 By Mouth Once Daily 5)  Cetirizine Hcl 10 Mg  Tabs (Cetirizine Hcl) .Marland Kitchen.. 1 By Mouth Once Daily As Needed Allergies 6)  Aspir-Low 81 Mg Tbec (Aspirin) .Marland Kitchen.. 1 By Mouth Once Daily 7)  Lisinopril 20 Mg Tabs (Lisinopril) .Marland Kitchen.. 1po Once Daily 8)  Tramadol Hcl 50 Mg Tabs (Tramadol Hcl) .Marland Kitchen.. 1 By Mouth Q 6 Hrs As Needed 9)  Amlodipine Besylate 10 Mg Tabs (Amlodipine Besylate) .... Take One Tablet By Mouth Daily  Current Medications (verified): 1)  Simvastatin 40 Mg Tabs (Simvastatin) .Marland Kitchen.. 1po Once Daily 2)  Cialis 20 Mg Tabs (Tadalafil) .Marland Kitchen.. 1 By Mouth Once Daily As Needed 3)  Protonix 40 Mg Tbec (Pantoprazole Sodium) .Marland Kitchen.. 1 By Mouth Two Times A Day 4)  Vesicare 10 Mg Tabs (Solifenacin Succinate) .Marland Kitchen.. 1 By Mouth Once Daily 5)  Cetirizine Hcl 10 Mg  Tabs (Cetirizine Hcl) .Marland Kitchen.. 1 By Mouth Once Daily As Needed Allergies 6)  Aspir-Low 81 Mg Tbec (Aspirin) .Marland Kitchen.. 1 By Mouth Once Daily 7)  Lisinopril 20 Mg Tabs (Lisinopril) .Marland Kitchen.. 1po Once Daily 8)  Tramadol Hcl 50 Mg Tabs (Tramadol Hcl) .Marland Kitchen.. 1 By Mouth Q 6 Hrs As Needed 9)  Amlodipine Besylate 10 Mg Tabs (Amlodipine Besylate) .... Take One Tablet By Mouth Daily 10)  Flexeril 5 Mg Tabs (Cyclobenzaprine Hcl) .Marland Kitchen.. 1 By Mouth Three Times A Day As Needed  Allergies (verified): No Known Drug Allergies  Past History:  Past Medical History: Last updated: 10/24/2009 Current Problems:  BRADYCARDIA (ICD-427.89) HYPERTENSION (ICD-401.9) HYPERLIPIDEMIA (ICD-272.4) NEPHROLITHIASIS,  HX OF (ICD-V13.01) BENIGN PROSTATIC HYPERTROPHY (ICD-600.00) PERSONAL HX COLONIC POLYPS (ICD-V12.72) DIVERTICULOSIS-COLON (ICD-562.10) ANOREXIA (ICD-783.0) GERD (ICD-530.81) WEIGHT LOSS-ABNORMAL (ICD-783.21) NAUSEA (ICD-787.02) ABDOMINAL PAIN-EPIGASTRIC (ICD-789.06) PANCREATITIS, HX OF (ICD-V12.70) EPIGASTRIC PAIN (ICD-789.06) EPIGASTRIC PAIN (ICD-789.06) FATIGUE (ICD-780.79) DEPRESSION (ICD-311) DIVERTICULOSIS, COLON (ICD-562.10) LOW BACK PAIN (ICD-724.2) GERD (ICD-530.81) ALLERGIC RHINITIS (ICD-477.9) OVERACTIVE BLADDER (ICD-596.51) PREVENTIVE HEALTH CARE (ICD-V70.0)    Past Surgical History: Last updated: 06/05/2009 pacemaker 05/2009  Social History: Last updated: 06/05/2009 retired - post office Former Smoker Alcohol use-no Married 2 children - 1 deceased with homicide Illicit Drug Use - no  Risk Factors: Smoking Status: quit (05/30/2008)  Review of Systems       all otherwise negative per pt -  Physical Exam  General:  alert and well-developed.   Head:  normocephalic and atraumatic.   Eyes:  vision grossly intact, pupils equal, and pupils round.   Ears:  R ear normal and L ear normal.   Nose:  no external deformity, no nasal discharge, and no mucosal edema.   Mouth:  no gingival abnormalities and pharynx pink and moist.   Neck:  supple and no masses.   Lungs:  normal respiratory effort and normal breath sounds.   Heart:  normal rate and regular rhythm.   Msk:  right shoulder FROM, NT and no rash or swelling;  lumbar area midline and paravertebral without tender, sweling or rash;  right achilles tendon mild tender at the heel insertion site but no sweling and gait is normal Extremities:  no edema, no erythema  Neurologic:  cranial nerves II-XII intact, strength normal in all extremities, sensation intact to light touch, and DTRs symmetrical and normal.     Impression & Recommendations:  Problem # 1:  ACHILLES TENDINITIS (ICD-726.71) mild, educated,  reassured, for tylenol as needed or advil as needed   Problem # 2:  BACK PAIN (ICD-724.5)  His updated medication list for this problem includes:    Aspir-low 81 Mg Tbec (Aspirin) .Marland Kitchen... 1 by mouth once daily    Tramadol Hcl 50 Mg Tabs (Tramadol hcl) .Marland Kitchen... 1 by mouth q 6 hrs as needed    Flexeril 5 Mg Tabs (Cyclobenzaprine hcl) .Marland Kitchen... 1 by mouth three times a day as needed ? msk strain - exam benign for acute - treat as above, f/u any worsening signs or symptoms   Problem # 3:  SHOULDER PAIN, RIGHT (ICD-719.41)  His updated medication list for this problem includes:    Aspir-low 81 Mg Tbec (Aspirin) .Marland Kitchen... 1 by mouth once daily    Tramadol Hcl 50 Mg Tabs (Tramadol hcl) .Marland Kitchen... 1 by mouth q 6 hrs as needed    Flexeril 5 Mg Tabs (Cyclobenzaprine hcl) .Marland Kitchen... 1 by mouth three times a day as needed exam benign, unclear etiology, ok to follow - ? mild  rot cuff tedonitis vs strain?  Problem # 4:  HYPERTENSION (ICD-401.9)  His updated medication list for this problem includes:    Lisinopril 20 Mg Tabs (Lisinopril) .Marland Kitchen... 1po once daily    Amlodipine Besylate 10 Mg Tabs (Amlodipine besylate) .Marland Kitchen... Take one tablet by mouth daily  BP today: 130/90 Prior BP: 140/88 (10/25/2009)  Labs Reviewed: K+: 4.0 (05/27/2009) Creat: : 0.8 (05/27/2009)   Chol: 179 (05/27/2009)   HDL: 61.30 (05/27/2009)   LDL: 102 (05/27/2009)   TG: 77.0 (05/27/2009) stable overall by hx and exam, ok to continue meds/tx as is   Complete Medication List: 1)  Simvastatin 40 Mg Tabs (Simvastatin) .Marland Kitchen.. 1po once daily 2)  Cialis 20 Mg Tabs (Tadalafil) .Marland Kitchen.. 1 by mouth once daily as needed 3)  Protonix 40 Mg Tbec (Pantoprazole sodium) .Marland Kitchen.. 1 by mouth two times a day 4)  Vesicare 10 Mg Tabs (Solifenacin succinate) .Marland Kitchen.. 1 by mouth once daily 5)  Cetirizine Hcl 10 Mg Tabs (Cetirizine hcl) .Marland Kitchen.. 1 by mouth once daily as needed allergies 6)  Aspir-low 81 Mg Tbec (Aspirin) .Marland Kitchen.. 1 by mouth once daily 7)  Lisinopril 20 Mg Tabs (Lisinopril)  .Marland Kitchen.. 1po once daily 8)  Tramadol Hcl 50 Mg Tabs (Tramadol hcl) .Marland Kitchen.. 1 by mouth q 6 hrs as needed 9)  Amlodipine Besylate 10 Mg Tabs (Amlodipine besylate) .... Take one tablet by mouth daily 10)  Flexeril 5 Mg Tabs (Cyclobenzaprine hcl) .Marland Kitchen.. 1 by mouth three times a day as needed  Patient Instructions: 1)  Please take all new medications as prescribed - the muscle relaxer as needed for pain 2)  Continue all previous medications as before this visit  3)  Please schedule a follow-up appointment in 6 months for the "yearly exam" Prescriptions: FLEXERIL 5 MG TABS (CYCLOBENZAPRINE HCL) 1 by mouth three times a day as needed  #50 x 1   Entered and Authorized by:   Corwin Levins MD   Signed by:   Corwin Levins MD on 12/09/2009   Method used:   Print then Give to Patient   RxID:   [id number]

## 2010-12-16 NOTE — Assessment & Plan Note (Signed)
Summary: PER DAHLIA SCHED--STC   Vital Signs:  Patient profile:   70 year old male Height:      69 inches Weight:      175 pounds BMI:     25.94 O2 Sat:      98 % on Room air Temp:     98.1 degrees F oral Pulse rate:   63 / minute BP sitting:   128 / 90  (left arm) Cuff size:   regular  Vitals Entered ByZella Ball Ewing (December 25, 2009 2:05 PM)  O2 Flow:  Room air  CC: No energy/RE   Primary Care Provider:  Bayard Males  CC:  No energy/RE.  History of Present Illness: here after pain last wk with lower back bilat with rad tionto  rle now resolved, but now witih severe fatigue per pt, "like I'm trembling on the insude";  overall good compliance with meds, tolerating well but 'just feel drained."  No pain, Pt denies CP, sob, doe, wheezing, orthopnea, pnd, worsening LE edema, palps, dizziness or syncope   Has much difficulty sleeping at night but doesnot htink due to pain;  "just feeel like a rag doll" and "cant lift 5 lbs.  Had much difficulty with putting pants on this am bt again no pain now to the bck and no LE pain, wekaness or numb ness.  No abd pain, constipation, n/v, diarrhea, or GU symtpoms such as hesitancy , freq or orgency.  BP at home (checks fairly often) iwth DBP in the 80's usually, top numbner usualy about 139.  Only dizziness is first in the am or a few seconds on first sitting up in the am with some blurred vision, but then none the rest of the days.  Does have some intermittent lfet ear aching it seems due to dentures not fitting well.  Denies fever, wt loss, night swetas, joint pains or rash.  Gained a lb or two since last visit, and admits he has at least mild depressive symjtpoms, no suicidal idation, no panic.  Denies significant anxiety.  Pt denies new neuro symptoms such as headache, facial or extremity weakness Wife has OSA but he does not think he has similar symtpoms.    Problems Prior to Update: 1)  Special Screening Malig Neoplasms Other Sites   (ICD-V76.49) 2)  Fatigue  (ICD-780.79) 3)  Shoulder Pain, Right  (ICD-719.41) 4)  Achilles Tendinitis  (ICD-726.71) 5)  Back Pain  (ICD-724.5) 6)  Bradycardia  (ICD-427.89) 7)  Hypertension  (ICD-401.9) 8)  Hyperlipidemia  (ICD-272.4) 9)  Nephrolithiasis, Hx of  (ICD-V13.01) 10)  Benign Prostatic Hypertrophy  (ICD-600.00) 11)  Personal Hx Colonic Polyps  (ICD-V12.72) 12)  Diverticulosis-colon  (ICD-562.10) 13)  Anorexia  (ICD-783.0) 14)  Gerd  (ICD-530.81) 15)  Weight Loss-abnormal  (ICD-783.21) 16)  Nausea  (ICD-787.02) 17)  Abdominal Pain-epigastric  (ICD-789.06) 18)  Pancreatitis, Hx of  (ICD-V12.70) 19)  Epigastric Pain  (ICD-789.06) 20)  Epigastric Pain  (ICD-789.06) 21)  Fatigue  (ICD-780.79) 22)  Depression  (ICD-311) 23)  Diverticulosis, Colon  (ICD-562.10) 24)  Low Back Pain  (ICD-724.2) 25)  Gerd  (ICD-530.81) 26)  Allergic Rhinitis  (ICD-477.9) 27)  Overactive Bladder  (ICD-596.51) 28)  Preventive Health Care  (ICD-V70.0)  Medications Prior to Update: 1)  Simvastatin 40 Mg Tabs (Simvastatin) .Marland Kitchen.. 1po Once Daily 2)  Cialis 20 Mg Tabs (Tadalafil) .Marland Kitchen.. 1 By Mouth Once Daily As Needed 3)  Protonix 40 Mg Tbec (Pantoprazole Sodium) .Marland Kitchen.. 1 By Mouth Two Times  A Day 4)  Vesicare 10 Mg Tabs (Solifenacin Succinate) .Marland Kitchen.. 1 By Mouth Once Daily 5)  Cetirizine Hcl 10 Mg  Tabs (Cetirizine Hcl) .Marland Kitchen.. 1 By Mouth Once Daily As Needed Allergies 6)  Aspir-Low 81 Mg Tbec (Aspirin) .Marland Kitchen.. 1 By Mouth Once Daily 7)  Lisinopril 20 Mg Tabs (Lisinopril) .Marland Kitchen.. 1po Once Daily 8)  Tramadol Hcl 50 Mg Tabs (Tramadol Hcl) .Marland Kitchen.. 1 By Mouth Q 6 Hrs As Needed 9)  Amlodipine Besylate 10 Mg Tabs (Amlodipine Besylate) .... Take One Tablet By Mouth Daily 10)  Flexeril 5 Mg Tabs (Cyclobenzaprine Hcl) .Marland Kitchen.. 1 By Mouth Three Times A Day As Needed  Current Medications (verified): 1)  Simvastatin 40 Mg Tabs (Simvastatin) .Marland Kitchen.. 1po Once Daily 2)  Cialis 20 Mg Tabs (Tadalafil) .Marland Kitchen.. 1 By Mouth Once Daily As Needed 3)   Protonix 40 Mg Tbec (Pantoprazole Sodium) .Marland Kitchen.. 1 By Mouth Two Times A Day 4)  Vesicare 10 Mg Tabs (Solifenacin Succinate) .Marland Kitchen.. 1 By Mouth Once Daily 5)  Cetirizine Hcl 10 Mg  Tabs (Cetirizine Hcl) .Marland Kitchen.. 1 By Mouth Once Daily As Needed Allergies 6)  Aspir-Low 81 Mg Tbec (Aspirin) .Marland Kitchen.. 1 By Mouth Once Daily 7)  Lisinopril 20 Mg Tabs (Lisinopril) .Marland Kitchen.. 1po Once Daily 8)  Tramadol Hcl 50 Mg Tabs (Tramadol Hcl) .Marland Kitchen.. 1 By Mouth Q 6 Hrs As Needed 9)  Amlodipine Besylate 10 Mg Tabs (Amlodipine Besylate) .... Take One Tablet By Mouth Daily 10)  Flexeril 5 Mg Tabs (Cyclobenzaprine Hcl) .Marland Kitchen.. 1 By Mouth Three Times A Day As Needed 11)  Citalopram Hydrobromide 10 Mg Tabs (Citalopram Hydrobromide) .Marland Kitchen.. 1po Once Daily  Allergies (verified): No Known Drug Allergies  Past History:  Past Medical History: Last updated: 10/24/2009 Current Problems:  BRADYCARDIA (ICD-427.89) HYPERTENSION (ICD-401.9) HYPERLIPIDEMIA (ICD-272.4) NEPHROLITHIASIS, HX OF (ICD-V13.01) BENIGN PROSTATIC HYPERTROPHY (ICD-600.00) PERSONAL HX COLONIC POLYPS (ICD-V12.72) DIVERTICULOSIS-COLON (ICD-562.10) ANOREXIA (ICD-783.0) GERD (ICD-530.81) WEIGHT LOSS-ABNORMAL (ICD-783.21) NAUSEA (ICD-787.02) ABDOMINAL PAIN-EPIGASTRIC (ICD-789.06) PANCREATITIS, HX OF (ICD-V12.70) EPIGASTRIC PAIN (ICD-789.06) EPIGASTRIC PAIN (ICD-789.06) FATIGUE (ICD-780.79) DEPRESSION (ICD-311) DIVERTICULOSIS, COLON (ICD-562.10) LOW BACK PAIN (ICD-724.2) GERD (ICD-530.81) ALLERGIC RHINITIS (ICD-477.9) OVERACTIVE BLADDER (ICD-596.51) PREVENTIVE HEALTH CARE (ICD-V70.0)    Past Surgical History: Last updated: 06/05/2009 pacemaker 05/2009  Family History: Last updated: 06/05/2009 father with HTN, lung cancer No FH of Colon Cancer:  Social History: Last updated: 12/25/2009 retired - post [employer] Former Smoker Alcohol use-no Married 3 children - 1 deceased with homicide Illicit Drug Use - no  Risk Factors: Smoking Status: quit  (05/30/2008)  Social History: Reviewed history from 06/05/2009 and no changes required. retired - post office Former Smoker Alcohol use-no Married 3 children - 1 deceased with homicide Illicit Drug Use - no  Review of Systems       The patient complains of depression.  The patient denies anorexia, fever, weight loss, vision loss, decreased hearing, hoarseness, chest pain, syncope, dyspnea on exertion, peripheral edema, prolonged cough, headaches, hemoptysis, abdominal pain, melena, hematochezia, severe indigestion/heartburn, hematuria, incontinence, muscle weakness, suspicious skin lesions, transient blindness, difficulty walking, unusual weight change, abnormal bleeding, enlarged lymph nodes, and angioedema.         all otherwise negative per pt -  Physical Exam  General:  alert and overweight-appearing.   Head:  normocephalic and atraumatic.   Eyes:  vision grossly intact, pupils equal, and pupils round.   Ears:  R ear normal and L ear normal.   Nose:  no external deformity and no nasal discharge.   Mouth:  no gingival abnormalities and pharynx  pink and moist.   Neck:  supple and no masses.   Lungs:  normal respiratory effort and normal breath sounds.   Heart:  normal rate and regular rhythm.   Abdomen:  soft, non-tender, and normal bowel sounds.   Msk:  no joint tenderness and no joint swelling.   Extremities:  no edema, no erythema  Neurologic:  cranial nerves II-XII intact and strength normal in all extremities.     Impression & Recommendations:  Problem # 1:  Preventive Health Care (ICD-V70.0)  Overall doing well, age appropriate education and counseling updated and referral for appropriate preventive services done unless declined, immunizations up to date or declined, diet counseling done if overweight, urged to quit smoking if smokes , most recent labs reviewed and current ordered if appropriate, ecg reviewed or declined (interpretation per ECG scanned in the EMR if  done); information regarding Medicare Prevention requirements given if appropriate   Orders: First annual wellness visit with prevention plan  (Z6109)  Problem # 2:  DEPRESSION (ICD-311)  His updated medication list for this problem includes:    Citalopram Hydrobromide 10 Mg Tabs (Citalopram hydrobromide) .Marland Kitchen... 1po once daily treat as above, f/u any worsening signs or symptoms   Orders: Prescription Created Electronically [phone])  Problem # 3:  FATIGUE (ICD-780.79)  exam benign, to check labs below; follow with expectant management   Orders: TLB-BMP (Basic Metabolic Panel-BMET) (80048-METABOL) TLB-CBC Platelet - w/Differential (85025-CBCD) TLB-Hepatic/Liver Function Pnl (80076-HEPATIC) TLB-TSH (Thyroid Stimulating Hormone) (84443-TSH) TLB-Sedimentation Rate (ESR) (85652-ESR) TLB-IBC Pnl (Iron/FE;Transferrin) (83550-IBC) TLB-B12 + Folate Pnl (09811_91478-G95/AOZ)  Problem # 4:  HYPERLIPIDEMIA (ICD-272.4)  His updated medication list for this problem includes:    Simvastatin 40 Mg Tabs (Simvastatin) .Marland Kitchen... 1po once daily  Labs Reviewed: SGOT: 26 (05/27/2009)   SGPT: 31 (05/27/2009)   HDL:61.30 (05/27/2009), 49.8 (05/30/2008)  LDL:102 (05/27/2009), 67 (30/86/5784)  Chol:179 (05/27/2009), 123 (05/30/2008)  Trig:77.0 (05/27/2009), 33 (05/30/2008) stable overall by hx and exam, ok to continue meds/tx as is - Pt to continue diet efforts, good med tolerance; to check labs - goal LDL less than 70   Orders: TLB-Lipid Panel (80061-LIPID)  Problem # 5:  HYPERTENSION (ICD-401.9)  His updated medication list for this problem includes:    Lisinopril 20 Mg Tabs (Lisinopril) .Marland Kitchen... 1po once daily    Amlodipine Besylate 10 Mg Tabs (Amlodipine besylate) .Marland Kitchen... Take one tablet by mouth daily  BP today: 128/90 Prior BP: 130/90 (12/09/2009)  Labs Reviewed: K+: 4.0 (05/27/2009) Creat: : 0.8 (05/27/2009)   Chol: 179 (05/27/2009)   HDL: 61.30 (05/27/2009)   LDL: 102 (05/27/2009)   TG: 77.0  (05/27/2009) stable overall by hx and exam, ok to continue meds/tx as is   Complete Medication List: 1)  Simvastatin 40 Mg Tabs (Simvastatin) .Marland Kitchen.. 1po once daily 2)  Cialis 20 Mg Tabs (Tadalafil) .Marland Kitchen.. 1 by mouth once daily as needed 3)  Protonix 40 Mg Tbec (Pantoprazole sodium) .Marland Kitchen.. 1 by mouth two times a day 4)  Vesicare 10 Mg Tabs (Solifenacin succinate) .Marland Kitchen.. 1 by mouth once daily 5)  Cetirizine Hcl 10 Mg Tabs (Cetirizine hcl) .Marland Kitchen.. 1 by mouth once daily as needed allergies 6)  Aspir-low 81 Mg Tbec (Aspirin) .Marland Kitchen.. 1 by mouth once daily 7)  Lisinopril 20 Mg Tabs (Lisinopril) .Marland Kitchen.. 1po once daily 8)  Tramadol Hcl 50 Mg Tabs (Tramadol hcl) .Marland Kitchen.. 1 by mouth q 6 hrs as needed 9)  Amlodipine Besylate 10 Mg Tabs (Amlodipine besylate) .... Take one tablet by mouth daily 10)  Flexeril  5 Mg Tabs (Cyclobenzaprine hcl) .Marland Kitchen.. 1 by mouth three times a day as needed 11)  Citalopram Hydrobromide 10 Mg Tabs (Citalopram hydrobromide) .Marland Kitchen.. 1po once daily  Other Orders: T-Culture, Urine ([id number]) TLB-Udip w/ Micro (81001-URINE) TLB-PSA (Prostate Specific Antigen) (84153-PSA)  Patient Instructions: 1)  Please take all new medications as prescribed  2)  Continue all previous medications as before this visit  3)  Please go to the Lab in the basement for your blood and/or urine tests today  4)  Please schedule a follow-up appointment in 1 month. Prescriptions: CITALOPRAM HYDROBROMIDE 10 MG TABS (CITALOPRAM HYDROBROMIDE) 1po once daily  #90 x 3   Entered and Authorized by:   Corwin Levins MD   Signed by:   Corwin Levins MD on 12/25/2009   Method used:   Print then Give to Patient   RxID:   [id number]   Appended Document: PER DAHLIA SCHED--STC for HPI:    Here for wellness Diet: Heart Healthy or DM if diabetic Physical Activities: Sedentary Depression/mood screen: positive as above Hearing: Intact bilateral Visual Acuity: Grossly normal ADL's: Capable  Fall Risk: None Home Safety:  Good End-of-Life Planning: Advance directive - Full code/I agree

## 2010-12-16 NOTE — Assessment & Plan Note (Signed)
Summary: POST HOSP/CD   Vital Signs:  Patient profile:   70 year old male Height:      69 inches Weight:      166.50 pounds BMI:     24.68 O2 Sat:      98 % on Room air Temp:     98 degrees F oral Pulse rate:   60 / minute BP sitting:   126 / 80  (left arm) Cuff size:   regular  Vitals Entered ByZella Ball Ewing (Mar 25, 2010 10:08 AM)  O2 Flow:  Room air  CC: Post Hospital/RE   Primary Care Provider:  Bayard Males  CC:  Post Hospital/RE.  History of Present Illness: [facility] - with n/v/d and low blood pressure, and elev CPK thought c/w rhabdo; since d/c he actually has  been taking the zocor and BP meds although record shows he was to be asked to temporarily hold on these until seen today;  Today states doing well ;  n/v/d all seemed to resolve quickly and started eating usual diet on the same  day of d/c;  Pt denies CP, sob, doe, wheezing, orthopnea, pnd, worsening LE edema, palps, dizziness or syncope  Pt denies new neuro symptoms such as headache, facial or extremity weakness  No bowel problem or GU symtpoms.    Problems Prior to Update: 1)  Rhabdomyolysis  (ICD-728.88) 2)  Special Screening Malig Neoplasms Other Sites  (ICD-V76.49) 3)  Fatigue  (ICD-780.79) 4)  Shoulder Pain, Right  (ICD-719.41) 5)  Achilles Tendinitis  (ICD-726.71) 6)  Back Pain  (ICD-724.5) 7)  Bradycardia  (ICD-427.89) 8)  Hypertension  (ICD-401.9) 9)  Hyperlipidemia  (ICD-272.4) 10)  Nephrolithiasis, Hx of  (ICD-V13.01) 11)  Benign Prostatic Hypertrophy  (ICD-600.00) 12)  Personal Hx Colonic Polyps  (ICD-V12.72) 13)  Diverticulosis-colon  (ICD-562.10) 14)  Anorexia  (ICD-783.0) 15)  Gerd  (ICD-530.81) 16)  Weight Loss-abnormal  (ICD-783.21) 17)  Nausea  (ICD-787.02) 18)  Abdominal Pain-epigastric  (ICD-789.06) 19)  Pancreatitis, Hx of  (ICD-V12.70) 20)  Epigastric Pain  (ICD-789.06) 21)  Epigastric Pain  (ICD-789.06) 22)  Fatigue  (ICD-780.79) 23)  Depression   (ICD-311) 24)  Diverticulosis, Colon  (ICD-562.10) 25)  Low Back Pain  (ICD-724.2) 26)  Gerd  (ICD-530.81) 27)  Allergic Rhinitis  (ICD-477.9) 28)  Overactive Bladder  (ICD-596.51) 29)  Preventive Health Care  (ICD-V70.0)  Medications Prior to Update: 1)  Simvastatin 40 Mg Tabs (Simvastatin) .Marland Kitchen.. 1po Once Daily 2)  Cialis 20 Mg Tabs (Tadalafil) .Marland Kitchen.. 1 By Mouth Once Daily As Needed 3)  Vesicare 10 Mg Tabs (Solifenacin Succinate) .Marland Kitchen.. 1 By Mouth Once Daily 4)  Cetirizine Hcl 10 Mg  Tabs (Cetirizine Hcl) .Marland Kitchen.. 1 By Mouth Once Daily As Needed Allergies 5)  Aspir-Low 81 Mg Tbec (Aspirin) .Marland Kitchen.. 1 By Mouth Once Daily 6)  Lisinopril 20 Mg Tabs (Lisinopril) .Marland Kitchen.. 1po Once Daily 7)  Tylenol With Codeine #3 300-30 Mg Tabs (Acetaminophen-Codeine) .Marland Kitchen.. 1 By Mouth Three Times A Day As Needed 8)  Amlodipine Besylate 5 Mg Tabs (Amlodipine Besylate) .... Take One Tablet By Mouth Daily 9)  Citalopram Hydrobromide 10 Mg Tabs (Citalopram Hydrobromide) .Marland Kitchen.. 1po Once Daily 10)  Nexium 40 Mg Cpdr (Esomeprazole Magnesium) .Marland Kitchen.. 1 By Mouth Qd  Current Medications (verified): 1)  Simvastatin 40 Mg Tabs (Simvastatin) .Marland Kitchen.. 1po Once Daily 2)  Cialis 20 Mg Tabs (Tadalafil) .Marland Kitchen.. 1 By Mouth Once Daily As Needed 3)  Vesicare 10 Mg Tabs (Solifenacin Succinate) .Marland KitchenMarland KitchenMarland Kitchen  1 By Mouth Once Daily 4)  Cetirizine Hcl 10 Mg  Tabs (Cetirizine Hcl) .Marland Kitchen.. 1 By Mouth Once Daily As Needed Allergies 5)  Aspir-Low 81 Mg Tbec (Aspirin) .Marland Kitchen.. 1 By Mouth Once Daily 6)  Lisinopril 20 Mg Tabs (Lisinopril) .Marland Kitchen.. 1po Once Daily 7)  Tylenol With Codeine #3 300-30 Mg Tabs (Acetaminophen-Codeine) .Marland Kitchen.. 1 By Mouth Three Times A Day As Needed 8)  Amlodipine Besylate 5 Mg Tabs (Amlodipine Besylate) .... Take One Tablet By Mouth Daily 9)  Citalopram Hydrobromide 10 Mg Tabs (Citalopram Hydrobromide) .Marland Kitchen.. 1po Once Daily 10)  Nexium 40 Mg Cpdr (Esomeprazole Magnesium) .Marland Kitchen.. 1 By Mouth Qd  Allergies (verified): No Known Drug Allergies  Past History:  Past  Medical History: Last updated: 01/13/2010 BRADYCARDIA (ICD-427.89) HYPERTENSION (ICD-401.9) HYPERLIPIDEMIA (ICD-272.4) NEPHROLITHIASIS, HX OF (ICD-V13.01) BENIGN PROSTATIC HYPERTROPHY (ICD-600.00) PERSONAL HX COLONIC POLYPS (ICD-V12.72) DIVERTICULOSIS-COLON (ICD-562.10) ANOREXIA (ICD-783.0) GERD (ICD-530.81) WEIGHT LOSS-ABNORMAL (ICD-783.21) NAUSEA (ICD-787.02) ABDOMINAL PAIN-EPIGASTRIC (ICD-789.06) PANCREATITIS, HX OF (ICD-V12.70) EPIGASTRIC PAIN (ICD-789.06) EPIGASTRIC PAIN (ICD-789.06) FATIGUE (ICD-780.79) DEPRESSION (ICD-311) DIVERTICULOSIS, COLON (ICD-562.10) LOW BACK PAIN (ICD-724.2) GERD (ICD-530.81) ALLERGIC RHINITIS (ICD-477.9) OVERACTIVE BLADDER (ICD-596.51) PREVENTIVE HEALTH CARE (ICD-V70.0)    Past Surgical History: Last updated: 06/05/2009 pacemaker 05/2009  Social History: Last updated: 12/25/2009 retired - post office Former Smoker Alcohol use-no Married 3 children - 1 deceased with homicide Illicit Drug Use - no  Risk Factors: Smoking Status: quit (05/30/2008)  Review of Systems       all otherwise negative per pt -    Physical Exam  General:  alert and well-developed.   Head:  normocephalic and atraumatic.   Eyes:  vision grossly intact, pupils equal, and pupils round.   Ears:  R ear normal and L ear normal.   Nose:  no external deformity and no nasal discharge.   Mouth:  no gingival abnormalities and pharynx pink and moist.   Neck:  supple and no masses.   Lungs:  normal respiratory effort and normal breath sounds.   Heart:  normal rate and regular rhythm.   Abdomen:  soft, non-tender, and normal bowel sounds.   Msk:  no joint tenderness and no joint swelling.  , and no muscle tender Extremities:  no edema, no erythema    Impression & Recommendations:  Problem # 1:  RHABDOMYOLYSIS (ICD-728.88)  to check f/u labs today - cpk, and renal fxn;  asympt today, cont by mouth fluids  Orders: TLB-CK Total Only(Creatine Kinase/CPK)  (82550-CK) TLB-BMP (Basic Metabolic Panel-BMET) (80048-METABOL)  Problem # 2:  HYPERLIPIDEMIA (ICD-272.4)  His updated medication list for this problem includes:    Simvastatin 40 Mg Tabs (Simvastatin) .Marland Kitchen... 1po once daily recent rahabdo not likely due to statin it seems; Continue all previous medications as before this visit   Labs Reviewed: SGOT: 25 (12/25/2009)   SGPT: 26 (12/25/2009)   HDL:72.00 (12/25/2009), 61.30 (05/27/2009)  LDL:88 (12/25/2009), 102 (98/09/9146)  Chol:167 (12/25/2009), 179 (05/27/2009)  Trig:36.0 (12/25/2009), 77.0 (05/27/2009)  Problem # 3:  HYPERTENSION (ICD-401.9)  His updated medication list for this problem includes:    Lisinopril 20 Mg Tabs (Lisinopril) .Marland Kitchen... 1po once daily    Amlodipine Besylate 5 Mg Tabs (Amlodipine besylate) .Marland Kitchen... Take one tablet by mouth daily  BP today: 126/80 Prior BP: 112/72 (01/20/2010)  Labs Reviewed: K+: 4.5 (12/25/2009) Creat: : 0.8 (12/25/2009)   Chol: 167 (12/25/2009)   HDL: 72.00 (12/25/2009)   LDL: 88 (12/25/2009)   TG: 36.0 (12/25/2009) stable overall by hx and exam, ok to continue meds/tx as is   Problem #  4:  GASTROENTERITIS, ACUTE (ICD-558.9) quickly resolved, exam benign, ok to follow for now with expectant management  Complete Medication List: 1)  Simvastatin 40 Mg Tabs (Simvastatin) .Marland Kitchen.. 1po once daily 2)  Cialis 20 Mg Tabs (Tadalafil) .Marland Kitchen.. 1 by mouth once daily as needed 3)  Vesicare 10 Mg Tabs (Solifenacin succinate) .Marland Kitchen.. 1 by mouth once daily 4)  Cetirizine Hcl 10 Mg Tabs (Cetirizine hcl) .Marland Kitchen.. 1 by mouth once daily as needed allergies 5)  Aspir-low 81 Mg Tbec (Aspirin) .Marland Kitchen.. 1 by mouth once daily 6)  Lisinopril 20 Mg Tabs (Lisinopril) .Marland Kitchen.. 1po once daily 7)  Tylenol With Codeine #3 300-30 Mg Tabs (Acetaminophen-codeine) .Marland Kitchen.. 1 by mouth three times a day as needed 8)  Amlodipine Besylate 5 Mg Tabs (Amlodipine besylate) .... Take one tablet by mouth daily 9)  Citalopram Hydrobromide 10 Mg Tabs (Citalopram  hydrobromide) .Marland Kitchen.. 1po once daily 10)  Nexium 40 Mg Cpdr (Esomeprazole magnesium) .Marland Kitchen.. 1 by mouth qd  Patient Instructions: 1)  Please go to the Lab in the basement for your blood and/or urine tests today  2)  Continue all previous medications as before this visit  3)  Please schedule a follow-up appointment in Feb 2012 or sooner if needed

## 2010-12-16 NOTE — Progress Notes (Signed)
Summary: Low BP?  Phone Note Call from Patient   Caller: Patient [phone] Summary of Call: pt called stating that his BP has been running very low causing dizziness and nausea (105/67) for the past 1 1/2weeks. Pt says that he has had to D/C BP medications because of this. Pt advised by Pharmacist to contact MD. Please advise, dizziness caused by BP meds? Initial call taken by: Margaret Pyle, CMA,  April 21, 2010 9:44 AM  Follow-up for Phone Call        ok to stop meds for now; ok for phenergan as needed nausea, and consider OV for persistent nausea and lower blood pressure soon   done hardcopy to [facility] side B - dahlia  Follow-up by: Corwin Levins MD,  April 21, 2010 10:01 AM  Additional Follow-up for Phone Call Additional follow up Details #1::        pt informed, Rx faxed to [facility] [address] Additional Follow-up by: Margaret Pyle, CMA,  April 21, 2010 10:14 AM    New/Updated Medications: PROMETHAZINE HCL 25 MG TABS (PROMETHAZINE HCL) 1 by mouth q 6 hrs prn nausea Prescriptions: PROMETHAZINE HCL 25 MG TABS (PROMETHAZINE HCL) 1 by mouth q 6 hrs prn nausea  #40 x 1   Entered and Authorized by:   Corwin Levins MD   Signed by:   Corwin Levins MD on 04/21/2010   Method used:   Print then Give to Patient   RxID:   [id number]

## 2010-12-16 NOTE — Progress Notes (Signed)
Summary: Rx req  Phone Note Call from Patient   Caller: Patient [phone] Summary of Call: Pt called stating that BP has been steadily increasing and he would like to re-start Amplodipine. Rx sen to pharmacy per pt request. Initial call taken by: Margaret Pyle, CMA,  July 30, 2010 10:27 AM    Prescriptions: AMLODIPINE BESYLATE 5 MG TABS (AMLODIPINE BESYLATE) 1 by mouth once daily  #30 x 5   Entered by:   Margaret Pyle, CMA   Authorized by:   Corwin Levins MD   Signed by:   Margaret Pyle, CMA on 07/30/2010   Method used:   Electronically to        [id number] (retail)       [address]       [place], [place]  [address]       Ph: [phone] or [phone]       Fax: [phone]   RxID:   [id number]

## 2010-12-16 NOTE — Assessment & Plan Note (Signed)
Summary: YEARLY FU/ LABS SAME DAY /NWS  #   Vital Signs:  Patient profile:   70 year old male Height:      69 inches Weight:      163.50 pounds BMI:     24.23 O2 Sat:      98 % on Room air Temp:     98.3 degrees F oral Pulse rate:   60 / minute BP sitting:   152 / 92  (left arm) Cuff size:   regular  Vitals Entered By: Zella Ball Ewing CMA Duncan Dull) (June 10, 2010 8:49 AM)  O2 Flow:  Room air  CC: yearly/RE   Primary Care Provider:  Bayard Males  CC:  yearly/RE.  History of Present Illness: here to f/u ; diarrhea resolved;  BP now tredning up in the past few days as he has regained his stamina;  checks daily at hoome;  Pt denies CP, sob, doe, wheezing, orthopnea, pnd, worsening LE edema, palps, dizziness or syncope  Pt denies new neuro symptoms such as headache, facial or extremity weakness   No fever, wt loss, night sweats, loss of appetite or other constitutional symptoms  ACE stopped april due to illness;  used to be on amlodipine as well, has rx at home.  also wants to go back to the citalopram due to increased stress and depressive symtpoms mild without suicidal ideation or panic.  now on 325 mg asa per cards  No longer taking the vesicare and bladder issue seems to have resolved.  Problems Prior to Update: 1)  Atrial Flutter, Paroxysmal  (ICD-427.32) 2)  Nausea and Vomiting  (ICD-787.01) 3)  Diarrhea of Presumed Infectious Origin  (ICD-009.3) 4)  Gastroenteritis, Acute  (ICD-558.9) 5)  Rhabdomyolysis  (ICD-728.88) 6)  Special Screening Malig Neoplasms Other Sites  (ICD-V76.49) 7)  Fatigue  (ICD-780.79) 8)  Shoulder Pain, Right  (ICD-719.41) 9)  Achilles Tendinitis  (ICD-726.71) 10)  Back Pain  (ICD-724.5) 11)  Bradycardia  (ICD-427.89) 12)  Hypertension  (ICD-401.9) 13)  Hyperlipidemia  (ICD-272.4) 14)  Nephrolithiasis, Hx of  (ICD-V13.01) 15)  Benign Prostatic Hypertrophy  (ICD-600.00) 16)  Personal Hx Colonic Polyps  (ICD-V12.72) 17)  Diverticulosis-colon   (ICD-562.10) 18)  Anorexia  (ICD-783.0) 19)  Gerd  (ICD-530.81) 20)  Weight Loss-abnormal  (ICD-783.21) 21)  Nausea  (ICD-787.02) 22)  Abdominal Pain-epigastric  (ICD-789.06) 23)  Pancreatitis, Hx of  (ICD-V12.70) 24)  Epigastric Pain  (ICD-789.06) 25)  Epigastric Pain  (ICD-789.06) 26)  Fatigue  (ICD-780.79) 27)  Depression  (ICD-311) 28)  Diverticulosis, Colon  (ICD-562.10) 29)  Low Back Pain  (ICD-724.2) 30)  Gerd  (ICD-530.81) 31)  Allergic Rhinitis  (ICD-477.9) 32)  Overactive Bladder  (ICD-596.51) 33)  Preventive Health Care  (ICD-V70.0)  Medications Prior to Update: 1)  Simvastatin 40 Mg Tabs (Simvastatin) .Marland Kitchen.. 1po Once Daily 2)  Cialis 20 Mg Tabs (Tadalafil) .Marland Kitchen.. 1 By Mouth Once Daily As Needed 3)  Vesicare 10 Mg Tabs (Solifenacin Succinate) .Marland Kitchen.. 1 By Mouth Once Daily 4)  Cetirizine Hcl 10 Mg  Tabs (Cetirizine Hcl) .Marland Kitchen.. 1 By Mouth Once Daily As Needed Allergies 5)  Aspirin 325 Mg Tabs (Aspirin) .... One By Mouth Qd 6)  Tylenol With Codeine #3 300-30 Mg Tabs (Acetaminophen-Codeine) .Marland Kitchen.. 1 By Mouth Three Times A Day As Needed 7)  Citalopram Hydrobromide 10 Mg Tabs (Citalopram Hydrobromide) .Marland Kitchen.. 1po Once Daily 8)  Promethazine Hcl 25 Mg Tabs (Promethazine Hcl) .Marland Kitchen.. 1 By Mouth Q 6 Hrs Prn Nausea 9)  Metronidazole 500 Mg  Tabs (Metronidazole) .Marland Kitchen.. 1 By Mouth Three Times A Day  Current Medications (verified): 1)  Simvastatin 40 Mg Tabs (Simvastatin) .Marland Kitchen.. 1po Once Daily 2)  Cialis 20 Mg Tabs (Tadalafil) .Marland Kitchen.. 1 By Mouth Once Daily As Needed 3)  Cetirizine Hcl 10 Mg  Tabs (Cetirizine Hcl) .Marland Kitchen.. 1 By Mouth Once Daily As Needed Allergies 4)  Aspirin 325 Mg Tabs (Aspirin) .... One By Mouth Qd 5)  Tylenol With Codeine #3 300-30 Mg Tabs (Acetaminophen-Codeine) .Marland Kitchen.. 1 By Mouth Three Times A Day As Needed 6)  Citalopram Hydrobromide 10 Mg Tabs (Citalopram Hydrobromide) .Marland Kitchen.. 1po Once Daily 7)  Promethazine Hcl 25 Mg Tabs (Promethazine Hcl) .Marland Kitchen.. 1 By Mouth Q 6 Hrs Prn Nausea 8)  Lisinopril  20 Mg Tabs (Lisinopril) .Marland Kitchen.. 1po Once Daily 9)  Amlodipine Besylate 5 Mg Tabs (Amlodipine Besylate) .Marland Kitchen.. 1 By Mouth Once Daily  Allergies (verified): No Known Drug Allergies  Past History:  Social History: Last updated: 12/25/2009 retired - post office Former Smoker Alcohol use-no Married 3 children - 1 deceased with homicide Illicit Drug Use - no  Risk Factors: Smoking Status: quit (05/30/2008)  Past Medical History: Atrial flutter documented by pacemaker 7/11 BRADYCARDIA (ICD-427.89) HYPERTENSION (ICD-401.9) HYPERLIPIDEMIA (ICD-272.4) NEPHROLITHIASIS, HX OF (ICD-V13.01) BENIGN PROSTATIC HYPERTROPHY (ICD-600.00) PERSONAL HX COLONIC POLYPS (ICD-V12.72) DIVERTICULOSIS-COLON (ICD-562.10) ANOREXIA (ICD-783.0) GERD (ICD-530.81) WEIGHT LOSS-ABNORMAL (ICD-783.21) NAUSEA (ICD-787.02) ABDOMINAL PAIN-EPIGASTRIC (ICD-789.06) PANCREATITIS, HX OF (ICD-V12.70) EPIGASTRIC PAIN (ICD-789.06) EPIGASTRIC PAIN (ICD-789.06) FATIGUE (ICD-780.79) DEPRESSION (ICD-311) DIVERTICULOSIS, COLON (ICD-562.10) LOW BACK PAIN (ICD-724.2) GERD (ICD-530.81) ALLERGIC RHINITIS (ICD-477.9) OVERACTIVE BLADDER (ICD-596.51) PREVENTIVE HEALTH CARE (ICD-V70.0) hx of rhabdomyolysis episode may 2011/dehydration  Hyperlipidemia Hypertension Diverticulosis, colon GERD         MD roster:  urology - [facility]/dr davis                           cards  -   Dr Johney Frame  Past Surgical History: Reviewed history from 06/05/2009 and no changes required. pacemaker 05/2009  Social History: Reviewed history from 12/25/2009 and no changes required. retired - post office Former Smoker Alcohol use-no Married 3 children - 1 deceased with homicide Illicit Drug Use - no  Review of Systems       all otherwise negative per pt -    Physical Exam  General:  alert and overweight-appearing.   Head:  normocephalic and atraumatic.   Eyes:  vision grossly intact, pupils equal, and pupils round.   Ears:  R ear  normal and L ear normal.   Nose:  no external deformity and no nasal discharge.   Mouth:  no gingival abnormalities and pharynx pink and moist.   Neck:  supple and no masses.   Lungs:  normal respiratory effort and normal breath sounds.   Heart:  normal rate and regular rhythm.  with occasional ectopy Abdomen:  soft, non-tender, and normal bowel sounds.   Extremities:  no edema, no erythema  Psych:  dysphoric affect and slightly anxious.     Impression & Recommendations:  Problem # 1:  GASTROENTERITIS, ACUTE (ICD-558.9) resolved  - ok to follow  Problem # 2:  HYPERTENSION (ICD-401.9)  His updated medication list for this problem includes:    Lisinopril 20 Mg Tabs (Lisinopril) .Marland Kitchen... 1po once daily    Amlodipine Besylate 5 Mg Tabs (Amlodipine besylate) .Marland Kitchen... 1 by mouth once daily to re-start meds, o/w stable overall by hx and exam, ok to continue meds/tx as is   BP today: 152/92 Prior  BP: 104/78 (05/29/2010)  Labs Reviewed: K+: 4.2 (05/20/2010) Creat: : 0.9 (05/20/2010)   Chol: 167 (12/25/2009)   HDL: 72.00 (12/25/2009)   LDL: 88 (12/25/2009)   TG: 36.0 (12/25/2009)  Problem # 3:  HYPERLIPIDEMIA (ICD-272.4)  His updated medication list for this problem includes:    Simvastatin 40 Mg Tabs (Simvastatin) .Marland Kitchen... 1po once daily no further rhabdo symptoms on the statin - ok to continue as is  Labs Reviewed: SGOT: 34 (05/20/2010)   SGPT: 23 (05/20/2010)   HDL:72.00 (12/25/2009), 61.30 (05/27/2009)  LDL:88 (12/25/2009), 102 (18/84/1660)  Chol:167 (12/25/2009), 179 (05/27/2009)  Trig:36.0 (12/25/2009), 77.0 (05/27/2009)  Problem # 4:  OVERACTIVE BLADDER (ICD-596.51) improved symptoms, ok to follow  Problem # 5:  DEPRESSION (ICD-311)  His updated medication list for this problem includes:    Citalopram Hydrobromide 10 Mg Tabs (Citalopram hydrobromide) .Marland Kitchen... 1po once daily to re-start the ssri - treat as above, f/u any worsening signs or symptoms   Complete Medication List: 1)   Simvastatin 40 Mg Tabs (Simvastatin) .Marland Kitchen.. 1po once daily 2)  Cialis 20 Mg Tabs (Tadalafil) .Marland Kitchen.. 1 by mouth once daily as needed 3)  Cetirizine Hcl 10 Mg Tabs (Cetirizine hcl) .Marland Kitchen.. 1 by mouth once daily as needed allergies 4)  Aspirin 325 Mg Tabs (Aspirin) .... One by mouth qd 5)  Tylenol With Codeine #3 300-30 Mg Tabs (Acetaminophen-codeine) .Marland Kitchen.. 1 by mouth three times a day as needed 6)  Citalopram Hydrobromide 10 Mg Tabs (Citalopram hydrobromide) .Marland Kitchen.. 1po once daily 7)  Promethazine Hcl 25 Mg Tabs (Promethazine hcl) .Marland Kitchen.. 1 by mouth q 6 hrs prn nausea 8)  Lisinopril 20 Mg Tabs (Lisinopril) .Marland Kitchen.. 1po once daily 9)  Amlodipine Besylate 5 Mg Tabs (Amlodipine besylate) .Marland Kitchen.. 1 by mouth once daily  Patient Instructions: 1)  please re-start the lisinopril at 20 mg per day 2)  please re-start the amlodipine 5 mg only if the BP in a wk or so is still over 140/90 (call if you have questions about this) 3)  re-start the citalopram 10 mg - 1 per day 4)  Continue all previous medications as before this visit  5)  Please schedule a follow-up appointment in Feb 2012 for "yearly medicare exam" (you will have labs later that day)

## 2010-12-16 NOTE — Assessment & Plan Note (Signed)
Summary: NECK PAIN,CD   Vital Signs:  Patient profile:   70 year old male Height:      69 inches Weight:      187.50 pounds BMI:     27.79 O2 Sat:      97 % on Room air Temp:     98.5 degrees F oral Pulse rate:   67 / minute BP sitting:   132 / 80  (left arm) Cuff size:   regular  Vitals Entered By: Zella Ball Ewing CMA Duncan Dull) (October 21, 2010 4:22 PM)  O2 Flow:  Room air CC: Neck Pain, nose and ears stopped up/RE   Primary Care Provider:  Bayard Males  CC:  Neck Pain and nose and ears stopped up/RE.  History of Present Illness: here to f/u last visit with neck pain;  has ongoing pain, moderate now , constant, with some radiation to the right upper back and shoulder but most slginficiantly to the right occiput area, worse to flex and extend , nothing makes better, nsaids not working;  no rash, swelling, tender, redness to the area.  No worsening UE pain/weakness/numbness, bowel or bladder change, gait change or fall.   Also mentions uncontrolled nasal allergy symptoms pressure like without fever, earache, ST or colored d/c,  not controlled on current meds.  Pt denies CP, worsening sob, doe, wheezing, orthopnea, pnd, worsening LE edema, palps, dizziness or syncope  Pt denies new neuro symptoms such as headache, facial or extremity weakness  Also c/o reflux overtly worsening with recent wt loss, despite diet changes, but no dysphagia, n/v, abd pain, bowel change or blood.    Problems Prior to Update: 1)  Neck Pain, Right  (ICD-723.1) 2)  Atrial Flutter, Paroxysmal  (ICD-427.32) 3)  Nausea and Vomiting  (ICD-787.01) 4)  Diarrhea of Presumed Infectious Origin  (ICD-009.3) 5)  Gastroenteritis, Acute  (ICD-558.9) 6)  Rhabdomyolysis  (ICD-728.88) 7)  Special Screening Malig Neoplasms Other Sites  (ICD-V76.49) 8)  Fatigue  (ICD-780.79) 9)  Shoulder Pain, Right  (ICD-719.41) 10)  Achilles Tendinitis  (ICD-726.71) 11)  Back Pain  (ICD-724.5) 12)  Bradycardia  (ICD-427.89) 13)   Hypertension  (ICD-401.9) 14)  Hyperlipidemia  (ICD-272.4) 15)  Nephrolithiasis, Hx of  (ICD-V13.01) 16)  Benign Prostatic Hypertrophy  (ICD-600.00) 17)  Personal Hx Colonic Polyps  (ICD-V12.72) 18)  Diverticulosis-colon  (ICD-562.10) 19)  Anorexia  (ICD-783.0) 20)  Gerd  (ICD-530.81) 21)  Weight Loss-abnormal  (ICD-783.21) 22)  Nausea  (ICD-787.02) 23)  Abdominal Pain-epigastric  (ICD-789.06) 24)  Pancreatitis, Hx of  (ICD-V12.70) 25)  Epigastric Pain  (ICD-789.06) 26)  Epigastric Pain  (ICD-789.06) 27)  Fatigue  (ICD-780.79) 28)  Depression  (ICD-311) 29)  Diverticulosis, Colon  (ICD-562.10) 30)  Low Back Pain  (ICD-724.2) 31)  Gerd  (ICD-530.81) 32)  Allergic Rhinitis  (ICD-477.9) 33)  Overactive Bladder  (ICD-596.51) 34)  Preventive Health Care  (ICD-V70.0)  Medications Prior to Update: 1)  Simvastatin 40 Mg Tabs (Simvastatin) .... 1/2 Po Once Daily 2)  Levitra 20 Mg Tabs (Vardenafil Hcl) .Marland Kitchen.. 1po Once Daily As Needed 3)  Cetirizine Hcl 10 Mg  Tabs (Cetirizine Hcl) .Marland Kitchen.. 1 By Mouth Once Daily As Needed Allergies 4)  Aspirin 325 Mg Tabs (Aspirin) .... One By Mouth Qd 5)  Tylenol With Codeine #3 300-30 Mg Tabs (Acetaminophen-Codeine) .Marland Kitchen.. 1 By Mouth Three Times A Day As Needed 6)  Citalopram Hydrobromide 10 Mg Tabs (Citalopram Hydrobromide) .Marland Kitchen.. 1po Once Daily 7)  Promethazine Hcl 25 Mg Tabs (Promethazine Hcl) .Marland KitchenMarland KitchenMarland Kitchen  1 By Mouth Q 6 Hrs Prn Nausea 8)  Lisinopril 20 Mg Tabs (Lisinopril) .Marland Kitchen.. 1po Once Daily 9)  Amlodipine Besylate 5 Mg Tabs (Amlodipine Besylate) .Marland Kitchen.. 1 By Mouth Once Daily 10)  Tamsulosin Hcl 0.4 Mg Caps (Tamsulosin Hcl) .Marland Kitchen.. 1po Once Daily  Current Medications (verified): 1)  Simvastatin 40 Mg Tabs (Simvastatin) .... 1/2 Po Once Daily 2)  Levitra 20 Mg Tabs (Vardenafil Hcl) .Marland Kitchen.. 1po Once Daily As Needed 3)  Cetirizine Hcl 10 Mg  Tabs (Cetirizine Hcl) .Marland Kitchen.. 1 By Mouth Once Daily As Needed Allergies 4)  Aspirin 325 Mg Tabs (Aspirin) .... One By Mouth Qd 5)  Tylenol  With Codeine #3 300-30 Mg Tabs (Acetaminophen-Codeine) .Marland Kitchen.. 1 By Mouth Three Times A Day As Needed 6)  Citalopram Hydrobromide 10 Mg Tabs (Citalopram Hydrobromide) .Marland Kitchen.. 1po Once Daily 7)  Promethazine Hcl 25 Mg Tabs (Promethazine Hcl) .Marland Kitchen.. 1 By Mouth Q 6 Hrs Prn Nausea 8)  Lisinopril 20 Mg Tabs (Lisinopril) .Marland Kitchen.. 1po Once Daily 9)  Amlodipine Besylate 5 Mg Tabs (Amlodipine Besylate) .Marland Kitchen.. 1 By Mouth Once Daily 10)  Tamsulosin Hcl 0.4 Mg Caps (Tamsulosin Hcl) .Marland Kitchen.. 1po Once Daily 11)  Gabapentin 300 Mg Caps (Gabapentin) .Marland Kitchen.. 1po  Three Times A Day 12)  Fluticasone Propionate 50 Mcg/act Susp (Fluticasone Propionate) .... 2 Spray/side Once Daily 13)  Omeprazole 20 Mg Cpdr (Omeprazole) .Marland Kitchen.. 1po Once Daily  Allergies (verified): No Known Drug Allergies  Past History:  Past Medical History: Last updated: 08/21/2010 Atrial flutter documented by pacemaker 7/11 BRADYCARDIA (ICD-427.89) HYPERTENSION (ICD-401.9) HYPERLIPIDEMIA (ICD-272.4) NEPHROLITHIASIS, HX OF (ICD-V13.01) BENIGN PROSTATIC HYPERTROPHY (ICD-600.00) PERSONAL HX COLONIC POLYPS (ICD-V12.72) DIVERTICULOSIS-COLON (ICD-562.10) ANOREXIA (ICD-783.0) GERD (ICD-530.81) WEIGHT LOSS-ABNORMAL (ICD-783.21) NAUSEA (ICD-787.02) ABDOMINAL PAIN-EPIGASTRIC (ICD-789.06) PANCREATITIS, HX OF (ICD-V12.70) EPIGASTRIC PAIN (ICD-789.06) EPIGASTRIC PAIN (ICD-789.06) FATIGUE (ICD-780.79) DEPRESSION (ICD-311) DIVERTICULOSIS, COLON (ICD-562.10) LOW BACK PAIN (ICD-724.2) GERD (ICD-530.81) ALLERGIC RHINITIS (ICD-477.9) OVERACTIVE BLADDER (ICD-596.51) PREVENTIVE HEALTH CARE (ICD-V70.0) hx of rhabdomyolysis episode may 2011/dehydration  Diverticulosis, colon         MD roster:  [facility]rology - [facility]/dr davis                           cards  -   Dr Johney Frame Benign prostatic hypertrophy  Past Surgical History: Last updated: 06/05/2009 pacemaker 05/2009  Social History: Last updated: 12/25/2009 retired - post office Former Smoker Alcohol  use-no Married 3 children - 1 deceased with homicide Illicit Drug Use - no  Risk Factors: Smoking Status: quit (05/30/2008)  Review of Systems       all otherwise negative per pt -    Physical Exam  General:  alert and well-developed.   Head:  normocephalic and atraumatic.   Eyes:  vision grossly intact, pupils equal, and pupils round.   Ears:  R ear normal and L ear normal.   Nose:  nasal dischargemucosal pallor and mucosal edema.   Mouth:  pharyngeal erythema and fair dentition.   Neck:  supple and no masses.   Lungs:  normal respiratory effort and normal breath sounds.   Heart:  normal rate and regular rhythm.   Abdomen:  soft, non-tender, and normal bowel sounds.   Msk:  mild to mod right post last neck but FROM, no rash or swelling Extremities:  no edema, no erythema  Neurologic:  cranial nerves II-XII intact, strength normal in all extremities, gait normal, and DTRs symmetrical and normal.     Impression & Recommendations:  Problem # 1:  NECK PAIN, RIGHT (ICD-723.1)  His updated medication list for this problem includes:    Aspirin 325 Mg Tabs (Aspirin) ..... One by mouth qd    Tylenol With Codeine #3 300-30 Mg Tabs (Acetaminophen-codeine) .Marland Kitchen... 1 by mouth three times a day as needed ? occipital neuralgia, treat as above, f/u any worsening signs or symptoms , as well as gabaentin trial,  consider neurology eval if not improved in 1-2 wks  Orders: Depo- Medrol 40mg  (J1030) Depo- Medrol 80mg  (J1040) Admin of Therapeutic Inj  intramuscular or subcutaneous (16109)  Problem # 2:  ALLERGIC RHINITIS (ICD-477.9)  His updated medication list for this problem includes:    Cetirizine Hcl 10 Mg Tabs (Cetirizine hcl) .Marland Kitchen... 1 by mouth once daily as needed allergies    Promethazine Hcl 25 Mg Tabs (Promethazine hcl) .Marland Kitchen... 1 by mouth q 6 hrs prn nausea    Fluticasone Propionate 50 Mcg/act Susp (Fluticasone propionate) .Marland Kitchen... 2 spray/side once daily treat as above, f/u any  worsening signs or symptoms , also for depomedrol IM today  Problem # 3:  GERD (ICD-530.81)  His updated medication list for this problem includes:    Omeprazole 20 Mg Cpdr (Omeprazole) .Marland Kitchen... 1po once daily treat as above, f/u any worsening signs or symptoms   Problem # 4:  HYPERTENSION (ICD-401.9)  His updated medication list for this problem includes:    Lisinopril 20 Mg Tabs (Lisinopril) .Marland Kitchen... 1po once daily    Amlodipine Besylate 5 Mg Tabs (Amlodipine besylate) .Marland Kitchen... 1 by mouth once daily  BP today: 132/80 Prior BP: 122/80 (08/21/2010)  Labs Reviewed: K+: 4.2 (05/20/2010) Creat: : 0.9 (05/20/2010)   Chol: 167 (12/25/2009)   HDL: 72.00 (12/25/2009)   LDL: 88 (12/25/2009)   TG: 36.0 (12/25/2009) stable overall by hx and exam, ok to continue meds/tx as is   Complete Medication List: 1)  Simvastatin 40 Mg Tabs (Simvastatin) .... 1/2 po once daily 2)  Levitra 20 Mg Tabs (Vardenafil hcl) .Marland Kitchen.. 1po once daily as needed 3)  Cetirizine Hcl 10 Mg Tabs (Cetirizine hcl) .Marland Kitchen.. 1 by mouth once daily as needed allergies 4)  Aspirin 325 Mg Tabs (Aspirin) .... One by mouth qd 5)  Tylenol With Codeine #3 300-30 Mg Tabs (Acetaminophen-codeine) .Marland Kitchen.. 1 by mouth three times a day as needed 6)  Citalopram Hydrobromide 10 Mg Tabs (Citalopram hydrobromide) .Marland Kitchen.. 1po once daily 7)  Promethazine Hcl 25 Mg Tabs (Promethazine hcl) .Marland Kitchen.. 1 by mouth q 6 hrs prn nausea 8)  Lisinopril 20 Mg Tabs (Lisinopril) .Marland Kitchen.. 1po once daily 9)  Amlodipine Besylate 5 Mg Tabs (Amlodipine besylate) .Marland Kitchen.. 1 by mouth once daily 10)  Tamsulosin Hcl 0.4 Mg Caps (Tamsulosin hcl) .Marland Kitchen.. 1po once daily 11)  Gabapentin 300 Mg Caps (Gabapentin) .Marland Kitchen.. 1po  three times a day 12)  Fluticasone Propionate 50 Mcg/act Susp (Fluticasone propionate) .... 2 spray/side once daily 13)  Omeprazole 20 Mg Cpdr (Omeprazole) .Marland Kitchen.. 1po once daily  Patient Instructions: 1)  You had the steroid shot today 2)  Please take all new medications as prescribed -  the gabapentin is started at one per night for 2 nights, then two times a day for 2 nights, then three times a day after that 3)  Please take all new medications as prescribed - the generic flonase for allergies, and the prilosec 20 mg per day (generic) 4)  Continue all previous medications as before this visit  5)  Please schedule a follow-up appointment as needed. Prescriptions: OMEPRAZOLE 20 MG CPDR (OMEPRAZOLE) 1po  once daily  #90 x 3   Entered and Authorized by:   Corwin Levins MD   Signed by:   Corwin Levins MD on 10/21/2010   Method used:   Print then Give to Patient   RxID:   [id number] FLUTICASONE PROPIONATE 50 MCG/ACT SUSP (FLUTICASONE PROPIONATE) 2 spray/side once daily  #1 x 11   Entered and Authorized by:   Corwin Levins MD   Signed by:   Corwin Levins MD on 10/21/2010   Method used:   Print then Give to Patient   RxID:   [id number] GABAPENTIN 300 MG CAPS (GABAPENTIN) 1po  three times a day  #90 x 5   Entered and Authorized by:   Corwin Levins MD   Signed by:   Corwin Levins MD on 10/21/2010   Method used:   Print then Give to Patient   RxID:   [id number]    Medication Administration  Injection # 1:    Medication: Depo- Medrol 40mg     Diagnosis: NECK PAIN, RIGHT (ICD-723.1)    Route: IM    Site: LUOQ gluteus    Exp Date: 02/2013    Lot #: [id number]    Mfr: [employer]    Comments: patient received 120mg  Depo-medrol    Patient tolerated injection without complications    Given by: Zella Ball Ewing CMA Duncan Dull) (October 21, 2010 5:04 PM)  Injection # 2:    Medication: Depo- Medrol 80mg     Diagnosis: NECK PAIN, RIGHT (ICD-723.1)    Route: IM    Site: LUOQ gluteus    Exp Date: 02/2013    Lot #: [id number]    Mfr: [employer]    Given by: Zella Ball Ewing CMA Duncan Dull) (October 21, 2010 5:04 PM)  Orders Added: 1)  Depo- Medrol 40mg  [J1030] 2)  Depo- Medrol 80mg  [J1040] 3)  Admin of Therapeutic Inj  intramuscular or subcutaneous [96372] 4)  Est. Patient Level IV [43329]

## 2010-12-16 NOTE — Medication Information (Signed)
Summary: [facility]  [facility]   Imported By: Sherian Rein 05/22/2010 13:45:44  _____________________________________________________________________  External Attachment:    Type:   Image     Comment:   External Document

## 2010-12-16 NOTE — Progress Notes (Signed)
  Phone Note From Pharmacy   Caller: [facility] of Call: Pharmacy requesting to be advised on drug interaction. Patent is on simvastatin 40mg  and Amlodipine 5mg  may be potential interaction between the two medications, please advise Initial call taken by: Robin Ewing CMA Duncan Dull),  July 30, 2010 3:50 PM  Follow-up for Phone Call        ok to decr the simvastatin to 20 mg  robin to notify pt as well  Follow-up by: Corwin Levins MD,  July 30, 2010 4:01 PM  Additional Follow-up for Phone Call Additional follow up Details #1::        called pt left msg. to call back Additional Follow-up by: Robin Ewing CMA Duncan Dull),  July 30, 2010 4:31 PM    Additional Follow-up for Phone Call Additional follow up Details #2::    called pt left msg. to call back Follow-up by: Robin Ewing CMA Duncan Dull),  July 31, 2010 7:58 AM  Additional Follow-up for Phone Call Additional follow up Details #3:: Details for Additional Follow-up Action Taken: Patient called back on his cell phone at [phone] informed patient of information above . Patient agreed to do so. Additional Follow-up by: Robin Ewing CMA (AAMA),  July 31, 2010 11:51 AM  New/Updated Medications: SIMVASTATIN 40 MG TABS (SIMVASTATIN) 1/2 po once daily

## 2010-12-16 NOTE — Progress Notes (Signed)
Summary: WEAK,PULSE 80 EVEN WHEN SITTING   Phone Note Call from Patient Call back at Home Phone [phone]   Caller: Patient Summary of Call: PT IS WEAK AND HIS PULSE IS 82 SITTING. Initial call taken by: Judie Grieve,  December 23, 2009 12:39 PM  Follow-up for Phone Call        c/o very vague symptoms Follow-up by: Hillis Range, MD,  December 23, 2009 8:09 PM  Additional Follow-up for Phone Call Additional follow up Details #1::        Pt should follow-up with PCP for these symptoms.  We will add him to our next available clinic. Additional Follow-up by: Hillis Range, MD,  December 23, 2009 8:10 PM

## 2010-12-16 NOTE — Assessment & Plan Note (Signed)
Summary: NAUSEA/VOMITING-LB   Vital Signs:  Patient profile:   70 year old male Height:      69 inches Weight:      161.13 pounds BMI:     23.88 O2 Sat:      98 % on Room air Temp:     100 degrees F oral Pulse rate:   59 / minute BP sitting:   100 / 62  (left arm) Cuff size:   regular  Vitals Entered By: Zella Ball Ewing CMA Duncan Dull) (May 20, 2010 2:39 PM)  O2 Flow:  Room air  CC: Nausea and vomiting/RE   Primary Care Provider:  Bayard Males  CC:  Nausea and vomiting/RE.  History of Present Illness: here with acute seemingly recurrent n/v x 2 to 3 days, persistent , mild to mod;  without abd pain except sore from throwing up;  also assoc with loose stool/watery diarrhea;  no blood in emesis or bm's; denies orthostasis;  has low grade temp today,  has some mild various area myalgias as well but o/w no other pain; no one else sick at home; no recent antibx; BM's seems more foul than usual, no hx of c diff;  wonders if his meds including the celexa could be related;  denies worsening depressive symtpoms, suicidal ideation or panic.   BP seems also somewhat more labile today,  but overall has had less lower BP as he has been off the amlodipine 5 mg in recent weeks; (did take one today with BP up to 150 with illness above after checking the BP this AM);  Pt denies CP, sob, doe, wheezing, orthopnea, pnd, worsening LE edema, palps, dizziness or syncope  Pt denies new neuro symptoms such as headache, facial or extremity weakness    Problems Prior to Update: 1)  Nausea and Vomiting  (ICD-787.01) 2)  Diarrhea of Presumed Infectious Origin  (ICD-009.3) 3)  Gastroenteritis, Acute  (ICD-558.9) 4)  Rhabdomyolysis  (ICD-728.88) 5)  Special Screening Malig Neoplasms Other Sites  (ICD-V76.49) 6)  Fatigue  (ICD-780.79) 7)  Shoulder Pain, Right  (ICD-719.41) 8)  Achilles Tendinitis  (ICD-726.71) 9)  Back Pain  (ICD-724.5) 10)  Bradycardia  (ICD-427.89) 11)  Hypertension  (ICD-401.9) 12)   Hyperlipidemia  (ICD-272.4) 13)  Nephrolithiasis, Hx of  (ICD-V13.01) 14)  Benign Prostatic Hypertrophy  (ICD-600.00) 15)  Personal Hx Colonic Polyps  (ICD-V12.72) 16)  Diverticulosis-colon  (ICD-562.10) 17)  Anorexia  (ICD-783.0) 18)  Gerd  (ICD-530.81) 19)  Weight Loss-abnormal  (ICD-783.21) 20)  Nausea  (ICD-787.02) 21)  Abdominal Pain-epigastric  (ICD-789.06) 22)  Pancreatitis, Hx of  (ICD-V12.70) 23)  Epigastric Pain  (ICD-789.06) 24)  Epigastric Pain  (ICD-789.06) 25)  Fatigue  (ICD-780.79) 26)  Depression  (ICD-311) 27)  Diverticulosis, Colon  (ICD-562.10) 28)  Low Back Pain  (ICD-724.2) 29)  Gerd  (ICD-530.81) 30)  Allergic Rhinitis  (ICD-477.9) 31)  Overactive Bladder  (ICD-596.51) 32)  Preventive Health Care  (ICD-V70.0)  Medications Prior to Update: 1)  Simvastatin 40 Mg Tabs (Simvastatin) .Marland Kitchen.. 1po Once Daily 2)  Cialis 20 Mg Tabs (Tadalafil) .Marland Kitchen.. 1 By Mouth Once Daily As Needed 3)  Vesicare 10 Mg Tabs (Solifenacin Succinate) .Marland Kitchen.. 1 By Mouth Once Daily 4)  Cetirizine Hcl 10 Mg  Tabs (Cetirizine Hcl) .Marland Kitchen.. 1 By Mouth Once Daily As Needed Allergies 5)  Aspir-Low 81 Mg Tbec (Aspirin) .Marland Kitchen.. 1 By Mouth Once Daily 6)  Lisinopril 20 Mg Tabs (Lisinopril) .Marland Kitchen.. 1po Once Daily 7)  Tylenol With Codeine #3 300-30  Mg Tabs (Acetaminophen-Codeine) .Marland Kitchen.. 1 By Mouth Three Times A Day As Needed 8)  Amlodipine Besylate 5 Mg Tabs (Amlodipine Besylate) .... Take One Tablet By Mouth Daily 9)  Citalopram Hydrobromide 10 Mg Tabs (Citalopram Hydrobromide) .Marland Kitchen.. 1po Once Daily 10)  Promethazine Hcl 25 Mg Tabs (Promethazine Hcl) .Marland Kitchen.. 1 By Mouth Q 6 Hrs Prn Nausea  Current Medications (verified): 1)  Simvastatin 40 Mg Tabs (Simvastatin) .Marland Kitchen.. 1po Once Daily 2)  Cialis 20 Mg Tabs (Tadalafil) .Marland Kitchen.. 1 By Mouth Once Daily As Needed 3)  Vesicare 10 Mg Tabs (Solifenacin Succinate) .Marland Kitchen.. 1 By Mouth Once Daily 4)  Cetirizine Hcl 10 Mg  Tabs (Cetirizine Hcl) .Marland Kitchen.. 1 By Mouth Once Daily As Needed Allergies 5)   Aspir-Low 81 Mg Tbec (Aspirin) .Marland Kitchen.. 1 By Mouth Once Daily 6)  Lisinopril 20 Mg Tabs (Lisinopril) .Marland Kitchen.. 1po Once Daily 7)  Tylenol With Codeine #3 300-30 Mg Tabs (Acetaminophen-Codeine) .Marland Kitchen.. 1 By Mouth Three Times A Day As Needed 8)  Citalopram Hydrobromide 10 Mg Tabs (Citalopram Hydrobromide) .Marland Kitchen.. 1po Once Daily 9)  Promethazine Hcl 25 Mg Tabs (Promethazine Hcl) .Marland Kitchen.. 1 By Mouth Q 6 Hrs Prn Nausea 10)  Metronidazole 500 Mg Tabs (Metronidazole) .Marland Kitchen.. 1 By Mouth Three Times A Day  Allergies (verified): No Known Drug Allergies  Past History:  Past Medical History: Last updated: 01/13/2010 BRADYCARDIA (ICD-427.89) HYPERTENSION (ICD-401.9) HYPERLIPIDEMIA (ICD-272.4) NEPHROLITHIASIS, HX OF (ICD-V13.01) BENIGN PROSTATIC HYPERTROPHY (ICD-600.00) PERSONAL HX COLONIC POLYPS (ICD-V12.72) DIVERTICULOSIS-COLON (ICD-562.10) ANOREXIA (ICD-783.0) GERD (ICD-530.81) WEIGHT LOSS-ABNORMAL (ICD-783.21) NAUSEA (ICD-787.02) ABDOMINAL PAIN-EPIGASTRIC (ICD-789.06) PANCREATITIS, HX OF (ICD-V12.70) EPIGASTRIC PAIN (ICD-789.06) EPIGASTRIC PAIN (ICD-789.06) FATIGUE (ICD-780.79) DEPRESSION (ICD-311) DIVERTICULOSIS, COLON (ICD-562.10) LOW BACK PAIN (ICD-724.2) GERD (ICD-530.81) ALLERGIC RHINITIS (ICD-477.9) OVERACTIVE BLADDER (ICD-596.51) PREVENTIVE HEALTH CARE (ICD-V70.0)    Past Surgical History: Last updated: 06/05/2009 pacemaker 05/2009  Social History: Last updated: 12/25/2009 retired - post office Former Smoker Alcohol use-no Married 3 children - 1 deceased with homicide Illicit Drug Use - no  Risk Factors: Smoking Status: quit (05/30/2008)  Review of Systems       all otherwise negative per pt -    Physical Exam  General:  alert and well-developed.   Head:  normocephalic and atraumatic.   Eyes:  vision grossly intact, pupils equal, and pupils round.   Ears:  R ear normal and L ear normal.   Nose:  no external deformity and no nasal discharge.   Mouth:  pharyngeal erythema and  fair dentition.   Neck:  supple and no masses.   Lungs:  normal respiratory effort and normal breath sounds.   Heart:  normal rate and regular rhythm.   Abdomen:  soft, normal bowel sounds, no distention, no masses, no guarding, and no rigidity.  but with mild mid and epigastric tender  Extremities:  no edema, no erythema  Psych:  not depressed appearing and slightly anxious.     Impression & Recommendations:  Problem # 1:  DIARRHEA OF PRESUMED INFECTIOUS ORIGIN (ICD-009.3)  with fever, ? viral AGE vs c diff vs other;  does not appear to need imaging at this point, but will check labs including stool c diff  Orders: T-Culture, C-Diff Toxin A/B ([address]-60454) T-Culture, Stool (87045/87046-70140) T-Stool for O&P (09811-91478) T-Stool Giardia / Crypto- EIA (29562) TLB-BMP (Basic Metabolic Panel-BMET) (80048-METABOL) TLB-CBC Platelet - w/Differential (85025-CBCD) TLB-Hepatic/Liver Function Pnl (80076-HEPATIC)  Problem # 2:  NAUSEA AND VOMITING (ICD-787.01)  likely due to above, ok to follow with phenergan as needed   Orders: TLB-Lipase (83690-LIPASE) TLB-Amylase (82150-AMYL) TLB-Udip w/  Micro (81001-URINE)  Problem # 3:  HYPERTENSION (ICD-401.9)  The following medications were removed from the medication list:    Amlodipine Besylate 5 Mg Tabs (Amlodipine besylate) .Marland Kitchen... Take one tablet by mouth daily His updated medication list for this problem includes:    Lisinopril 20 Mg Tabs (Lisinopril) .Marland Kitchen... 1po once daily  BP today: 100/62 Prior BP: 126/80 (03/25/2010)  Labs Reviewed: K+: 4.5 (03/25/2010) Creat: : 0.9 (03/25/2010)   Chol: 167 (12/25/2009)   HDL: 72.00 (12/25/2009)   LDL: 88 (12/25/2009)   TG: 36.0 (12/25/2009) ok to cont to hold the amlodipine;  Continue all previous medications as before this visit , pt reassured  Problem # 4:  DEPRESSION (ICD-311)  His updated medication list for this problem includes:    Citalopram Hydrobromide 10 Mg Tabs (Citalopram  hydrobromide) .Marland Kitchen... 1po once daily stable overall by hx and exam, ok to continue meds/tx as is ; doubt med related to above symptoms  Complete Medication List: 1)  Simvastatin 40 Mg Tabs (Simvastatin) .Marland Kitchen.. 1po once daily 2)  Cialis 20 Mg Tabs (Tadalafil) .Marland Kitchen.. 1 by mouth once daily as needed 3)  Vesicare 10 Mg Tabs (Solifenacin succinate) .Marland Kitchen.. 1 by mouth once daily 4)  Cetirizine Hcl 10 Mg Tabs (Cetirizine hcl) .Marland Kitchen.. 1 by mouth once daily as needed allergies 5)  Aspir-low 81 Mg Tbec (Aspirin) .Marland Kitchen.. 1 by mouth once daily 6)  Lisinopril 20 Mg Tabs (Lisinopril) .Marland Kitchen.. 1po once daily 7)  Tylenol With Codeine #3 300-30 Mg Tabs (Acetaminophen-codeine) .Marland Kitchen.. 1 by mouth three times a day as needed 8)  Citalopram Hydrobromide 10 Mg Tabs (Citalopram hydrobromide) .Marland Kitchen.. 1po once daily 9)  Promethazine Hcl 25 Mg Tabs (Promethazine hcl) .Marland Kitchen.. 1 by mouth q 6 hrs prn nausea 10)  Metronidazole 500 Mg Tabs (Metronidazole) .Marland Kitchen.. 1 by mouth three times a day  Patient Instructions: 1)  the pain medication refill was faxed to your pharmacy 2)  Please take all new medications as prescribed - the generic metronidazole 3)  ok to stop the amlodipine due to the ongoing lower blood pressures 4)  Continue all previous medications as before this visit  5)  Check your Blood Pressure regularly. If it is lower than 120/90 on a regular basis, please call and let us know, as you may need lower lisinopril as well 6)  Please go to the Lab in the basement for your blood and/or stool  tests today  7)  Please schedule a follow-up appointment in 3 months, or sooner if needed Prescriptions: PROMETHAZINE HCL 25 MG TABS (PROMETHAZINE HCL) 1 by mouth q 6 hrs prn nausea  #40 x 1   Entered and Authorized by:   Corwin Levins MD   Signed by:   Corwin Levins MD on 05/20/2010   Method used:   Print then Give to Patient   RxID:   [id number] METRONIDAZOLE 500 MG TABS (METRONIDAZOLE) 1 by mouth three times a day  #30 x 0   Entered and  Authorized by:   Corwin Levins MD   Signed by:   Corwin Levins MD on 05/20/2010   Method used:   Print then Give to Patient   RxID:   [id number]

## 2010-12-16 NOTE — Assessment & Plan Note (Signed)
Summary: GUIDANT  Medications Added ASPIRIN 325 MG TABS (ASPIRIN) one by mouth qd      Allergies Added: NKDA  Visit Type:  Follow-up Referring Provider:  n/a Primary Provider:  Bayard Males  CC:  weakness.  History of Present Illness: The patient presents today for routine electrophysiology followup. He reports having difficulty with nausea and vomiting since last being seen in our office.  Though these symptoms have nearly resolved, he remains weak and occasionally dizzy.  He finds that his BP has been "low" (SBP 100).   The patient denies symptoms of palpitations, chest pain, shortness of breath, orthopnea, PND, lower extremity edema, dizziness, presyncope, syncope, or neurologic sequela. The patient is tolerating medications without difficulties and is otherwise without complaint today.   Current Medications (verified): 1)  Simvastatin 40 Mg Tabs (Simvastatin) .Marland Kitchen.. 1po Once Daily 2)  Cialis 20 Mg Tabs (Tadalafil) .Marland Kitchen.. 1 By Mouth Once Daily As Needed 3)  Vesicare 10 Mg Tabs (Solifenacin Succinate) .Marland Kitchen.. 1 By Mouth Once Daily 4)  Cetirizine Hcl 10 Mg  Tabs (Cetirizine Hcl) .Marland Kitchen.. 1 By Mouth Once Daily As Needed Allergies 5)  Aspir-Low 81 Mg Tbec (Aspirin) .Marland Kitchen.. 1 By Mouth Once Daily 6)  Tylenol With Codeine #3 300-30 Mg Tabs (Acetaminophen-Codeine) .Marland Kitchen.. 1 By Mouth Three Times A Day As Needed 7)  Citalopram Hydrobromide 10 Mg Tabs (Citalopram Hydrobromide) .Marland Kitchen.. 1po Once Daily 8)  Promethazine Hcl 25 Mg Tabs (Promethazine Hcl) .Marland Kitchen.. 1 By Mouth Q 6 Hrs Prn Nausea 9)  Metronidazole 500 Mg Tabs (Metronidazole) .Marland Kitchen.. 1 By Mouth Three Times A Day  Allergies (verified): No Known Drug Allergies  Past History:  Past Medical History: Atrial flutter documented by pacemaker 7/11 BRADYCARDIA (ICD-427.89) HYPERTENSION (ICD-401.9) HYPERLIPIDEMIA (ICD-272.4) NEPHROLITHIASIS, HX OF (ICD-V13.01) BENIGN PROSTATIC HYPERTROPHY (ICD-600.00) PERSONAL HX COLONIC POLYPS (ICD-V12.72) DIVERTICULOSIS-COLON  (ICD-562.10) ANOREXIA (ICD-783.0) GERD (ICD-530.81) WEIGHT LOSS-ABNORMAL (ICD-783.21) NAUSEA (ICD-787.02) ABDOMINAL PAIN-EPIGASTRIC (ICD-789.06) PANCREATITIS, HX OF (ICD-V12.70) EPIGASTRIC PAIN (ICD-789.06) EPIGASTRIC PAIN (ICD-789.06) FATIGUE (ICD-780.79) DEPRESSION (ICD-311) DIVERTICULOSIS, COLON (ICD-562.10) LOW BACK PAIN (ICD-724.2) GERD (ICD-530.81) ALLERGIC RHINITIS (ICD-477.9) OVERACTIVE BLADDER (ICD-596.51) PREVENTIVE HEALTH CARE (ICD-V70.0)    Past Surgical History: Reviewed history from 06/05/2009 and no changes required. pacemaker 05/2009  Social History: Reviewed history from 12/25/2009 and no changes required. retired - post office Former Smoker Alcohol use-no Married 3 children - 1 deceased with homicide Illicit Drug Use - no  Review of Systems       All systems are reviewed and negative except as listed in the HPI.   Vital Signs:  Patient profile:   70 year old male Height:      69 inches Weight:      163 pounds BMI:     24.16 Pulse rate:   74 / minute BP sitting:   104 / 78  (left arm)  Vitals Entered By: Laurance Flatten CMA (May 29, 2010 9:56 AM)  Physical Exam  General:  Well developed, well nourished, in no acute distress. Head:  normocephalic and atraumatic Eyes:  PERRLA/EOM intact; conjunctiva and lids normal. Mouth:  Teeth, gums and palate normal. Oral mucosa normal. Neck:  Neck supple, no JVD. No masses, thyromegaly or abnormal cervical nodes. Chest Wall:  pacemaker site is well healed Lungs:  Clear bilaterally to auscultation and percussion. Heart:  Non-displaced PMI, chest non-tender; regular rate and rhythm, S1, S2 without murmurs, rubs or gallops. Carotid upstroke normal, no bruit. Normal abdominal aortic size, no bruits. Femorals normal pulses, no bruits. Pedals normal pulses. No edema, no varicosities. Abdomen:  Bowel sounds positive; abdomen soft and non-tender without masses, organomegaly, or hernias noted. No  hepatosplenomegaly. Msk:  Back normal, normal gait. Muscle strength and tone normal. Pulses:  pulses normal in all 4 extremities Extremities:  No clubbing or cyanosis. Neurologic:  Alert and oriented x 3.   PPM Specifications Following MD:  Hillis Range, MD     PPM Vendor:  [facility] [employer]     [facility] Serial Number:  [id number] PPM DOI:  06/03/2009     PPM Implanting MD:  Hillis Range, MD Research Study Name Sinus node dysfunction/[employer]  Lead 1    Location: RA     DOI: 06/03/2009     Model #: [id number]     Serial #: [id number]     Status: active Lead 2    Location: RV     DOI: 06/03/2009     Model #: [id number]     Serial #: [id number]     Status: active  Magnet Response Rate:  BOL 100 ERI 85  Indications:  Sick sinus syndrome   PPM Follow Up Battery Voltage:  GOOD V     Battery Est. Longevity:  >5.48YRS     Pacer Dependent:  No       PPM Device Measurements Atrium  Amplitude: 3.6 mV, Impedance: 480 ohms, Threshold: 0.6 V at 0.40 msec Right Ventricle  Amplitude: 9.2 mV, Impedance: 620 ohms, Threshold: 0.9 V at 0.40 msec  Episodes MS Episodes:  23.6K     Percent Mode Switch:  12%     Coumadin:  No Atrial Pacing:  54%     Ventricular Pacing:  10%  Parameters Mode:  DDDR     Lower Rate Limit:  60     Upper Rate Limit:  150 Paced AV Delay:  400     Sensed AV Delay:  100 Next Cardiology Appt Due:  11/17/2010 Tech Comments:  PT IN MODE SWITCH 12% OF TIME.  NORMAL DEVICE FUNCTION.  NO CHANGES MADE.  PT COMPLAINS OF WEAKNESS BUT HAS HAD ISSUES W/VOMITING.  ROV IN 6 MTHS W/CLINIC. 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 His CHADS2 score is 1 (HTN).  We discussed Asprin vs coumadin/pradaxa for stroke prevention.  He is very clear in his decision to take ASA.  We will therefore increase ASA to 325mg  daily today.  Problem # 3:  FATIGUE (ICD-780.79) fatigue and dizziness,likely worsened due to recent N/V pt encouraged to increase by mouth fluid intake  Patient Instructions: 1)  Your physician recommends that you schedule a follow-up appointment in: 6 months with Dr Johney Frame 2)  Your physician has recommended you make the following change in your medication: increase Aspirin to 325mg  daily  3)  Push fluids

## 2010-12-16 NOTE — Medication Information (Signed)
Summary: [facility]  [facility]   Imported By: Lester Cambria 02/19/2010 10:09:16  _____________________________________________________________________  External Attachment:    Type:   Image     Comment:   External Document

## 2010-12-16 NOTE — Progress Notes (Signed)
Summary: Pantoprazole PA  Phone Note From Pharmacy   Caller: Fed Therapist, music of Call: PA--request. Has the patient tried an failed both Omeprazole and Nexium? Proceed with prior authorization? Please advise. Initial call taken by: Lucious Groves,  Mar 18, 2010 9:06 AM  Follow-up for Phone Call        please call pt - ? ok to try nexium  (ins will not pay for the protonix) Follow-up by: Corwin Levins MD,  Mar 18, 2010 10:13 AM  Additional Follow-up for Phone Call Additional follow up Details #1::        Spoke with patient and it is ok to change to Nexium. Additional Follow-up by: Lucious Groves,  Mar 18, 2010 11:31 AM    New/Updated Medications: NEXIUM 40 MG CPDR (ESOMEPRAZOLE MAGNESIUM) 1 by mouth qd Prescriptions: NEXIUM 40 MG CPDR (ESOMEPRAZOLE MAGNESIUM) 1 by mouth qd  #30 x 3   Entered by:   Lucious Groves   Authorized by:   Corwin Levins MD   Signed by:   Lucious Groves on 03/18/2010   Method used:   Electronically to        [id number] (retail)       [address]       [place], [place]  [address]       Ph: [phone] or [phone]       Fax: [phone]   RxID:   [id number]     Allergies: No Known Drug Allergies

## 2010-12-16 NOTE — Progress Notes (Signed)
Summary: Pt?  Phone Note Call from Patient Call back at [facility] Phone [phone]   Caller: Patient [phone] Summary of Call: Pt called stating he recieved PT message on neck XRay but is interested in any other treatment/medications JWJ may recommend? please advise. Initial call taken by: Margaret Pyle, CMA,  August 26, 2010 11:44 AM  Follow-up for Phone Call        call also alleve otc as needed more severe pain, o/w there is no other specific tx that will resolve the pain (unless he wants to try muscle relaxer as well to see if this helps) Follow-up by: Corwin Levins MD,  August 26, 2010 1:16 PM  Additional Follow-up for Phone Call Additional follow up Details #1::        Pt advised, will try OTC Aleve but declined muscle relaxers at this time. Additional Follow-up by: Margaret Pyle, CMA,  August 26, 2010 2:32 PM

## 2010-12-16 NOTE — Cardiovascular Report (Signed)
Summary: Office Visit   Office Visit   Imported By: Roderic Ovens 06/05/2010 15:18:50  _____________________________________________________________________  External Attachment:    Type:   Image     Comment:   External Document

## 2010-12-16 NOTE — Progress Notes (Signed)
Summary: Fatigue  Phone Note Call from Patient Call back at Home Phone [phone]   Caller: Patient Summary of Call: pt called stating that he has been extremely fatigued and weak. I called pt and advised that he should schedule f/u appt with MD to eval. he agreed and was transferred to schedule Initial call taken by: Margaret Pyle, CMA,  December 24, 2009 3:00 PM

## 2010-12-16 NOTE — Medication Information (Signed)
Summary: Order for Vacuum Erection Device  Order for Vacuum Erection Device   Imported By: Maryln Gottron 02/14/2010 12:22:14  _____________________________________________________________________  External Attachment:    Type:   Image     Comment:   External Document

## 2010-12-16 NOTE — Progress Notes (Signed)
Summary: Low BP  Phone Note Call from Patient   Caller: Patient [phone] Summary of Call: Pt called stating that BP has been consistantly low since OV, average 110/60. Pt says he has not taken Lisinopril or Simvastatin in 6 days. Pt wanted to inform MD and request a medication adjustment Initial call taken by: Margaret Pyle, CMA,  May 27, 2010 9:32 AM  Follow-up for Phone Call        ok to hold on taking the lisinopril  should cont the simvastatin since thi is for cholesterol  cont to check BP' daily and call in one wk wtih results Follow-up by: Corwin Levins MD,  May 27, 2010 1:01 PM  Additional Follow-up for Phone Call Additional follow up Details #1::        pt informed and agreed to call back in 1 week with BP readings Additional Follow-up by: Margaret Pyle, CMA,  May 27, 2010 3:11 PM

## 2010-12-16 NOTE — Progress Notes (Signed)
Summary: Medication Refill  Phone Note Refill Request Message from:  Fax from Pharmacy on May 20, 2010 9:02 AM  Refills Requested: Medication #1:  TYLENOL WITH CODEINE #3 300-30 MG TABS 1 by mouth three times a day as needed   Dosage confirmed as above?Dosage Confirmed   Last Refilled: 01/20/2010   [facility] [address], [phone] Initial call taken by: Robin Ewing CMA (AAMA),  May 20, 2010 9:02 AM    New/Updated Medications: TYLENOL WITH CODEINE #3 300-30 MG TABS (ACETAMINOPHEN-CODEINE) 1 by mouth three times a day as needed Prescriptions: TYLENOL WITH CODEINE #3 300-30 MG TABS (ACETAMINOPHEN-CODEINE) 1 by mouth three times a day as needed  #90 x 2   Entered and Authorized by:   Corwin Levins MD   Signed by:   Corwin Levins MD on 05/20/2010   Method used:   Print then Give to Patient   RxID:   [id number]  done hardcopy to [facility] side B - dahlia  Corwin Levins MD  May 20, 2010 1:03 PM   Rx faxed to pharmacy Margaret Pyle, CMA  May 20, 2010 1:28 PM

## 2010-12-16 NOTE — Progress Notes (Signed)
Summary: rx re-sent  Phone Note Call from Patient Call back at Home Phone [phone]   Caller: Patient Summary of Call: pt called stating that Rx that was [facility] was incorrect. Rx should have been for Protonix 40mg  two times a day, not once daily. I will re-sent rx  Initial call taken by: Margaret Pyle, CMA,  November 27, 2009 10:42 AM    New/Updated Medications: PROTONIX 40 MG TBEC (PANTOPRAZOLE SODIUM) 1 by mouth two times a day Prescriptions: PROTONIX 40 MG TBEC (PANTOPRAZOLE SODIUM) 1 by mouth two times a day  #180 x 3   Entered by:   Margaret Pyle, CMA   Authorized by:   Corwin Levins MD   Signed by:   Margaret Pyle, CMA on 11/27/2009   Method used:   Electronically to        [id number]       [address]       [place], [place]  [address]       Ph: [phone] or [phone]       Fax: [phone]   RxID:   [id number]   Appended Document: rx re-sent Pt called stating that his increase in Protonix needs a PA. Please call insurance at [phone].  Appended Document: rx re-sent [employer] and did a change in dosage override.  Appended Document: rx re-sent To complete override patient pharmacy must call [phone]--Left message on voicemail notifying pharmacy.  Appended Document: rx re-sent pt called to check status of PA. I informed pt that pharmacy needs to call above number to complete override. I also told pt that this information was left on pharmacy VM by CMA. pt will call pharmacy and provide information again.

## 2010-12-16 NOTE — Assessment & Plan Note (Signed)
Summary: 1 MO ROV /NWS  #   Vital Signs:  Patient profile:   70 year old male Height:      69 inches Weight:      173 pounds BMI:     25.64 O2 Sat:      97 % on Room air Temp:     98 degrees F oral Pulse rate:   60 / minute BP sitting:   112 / 72  (left arm) Cuff size:   regular  Vitals Entered ByZella Ball Ewing (January 20, 2010 8:11 AM)  O2 Flow:  Room air CC: 1 Mo ROV/RE   Primary Care Provider:  Bayard Males  CC:  1 Mo ROV/RE.  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    Problems Prior to Update: 1)  Special Screening Malig Neoplasms Other Sites  (ICD-V76.49) 2)  Fatigue  (ICD-780.79) 3)  Shoulder Pain, Right  (ICD-719.41) 4)  Achilles Tendinitis  (ICD-726.71) 5)  Back Pain  (ICD-724.5) 6)  Bradycardia  (ICD-427.89) 7)  Hypertension  (ICD-401.9) 8)  Hyperlipidemia  (ICD-272.4) 9)  Nephrolithiasis, Hx of  (ICD-V13.01) 10)  Benign Prostatic Hypertrophy  (ICD-600.00) 11)  Personal Hx Colonic Polyps  (ICD-V12.72) 12)  Diverticulosis-colon  (ICD-562.10) 13)  Anorexia  (ICD-783.0) 14)  Gerd  (ICD-530.81) 15)  Weight Loss-abnormal  (ICD-783.21) 16)  Nausea  (ICD-787.02) 17)  Abdominal Pain-epigastric  (ICD-789.06) 18)  Pancreatitis, Hx of  (ICD-V12.70) 19)  Epigastric Pain  (ICD-789.06) 20)  Epigastric Pain  (ICD-789.06) 21)  Fatigue  (ICD-780.79) 22)  Depression  (ICD-311) 23)  Diverticulosis, Colon  (ICD-562.10) 24)  Low Back Pain  (ICD-724.2) 25)  Gerd  (ICD-530.81) 26)  Allergic Rhinitis  (ICD-477.9) 27)  Overactive Bladder  (ICD-596.51) 28)  Preventive Health Care  (ICD-V70.0)  Medications Prior to  Update: 1)  Simvastatin 40 Mg Tabs (Simvastatin) .Marland Kitchen.. 1po Once Daily 2)  Cialis 20 Mg Tabs (Tadalafil) .Marland Kitchen.. 1 By Mouth Once Daily As Needed 3)  Protonix 40 Mg Tbec (Pantoprazole Sodium) .Marland Kitchen.. 1 By Mouth Two Times A Day 4)  Vesicare 10 Mg Tabs (Solifenacin Succinate) .Marland Kitchen.. 1 By Mouth Once Daily 5)  Cetirizine Hcl 10 Mg  Tabs (Cetirizine Hcl) .Marland Kitchen.. 1 By Mouth Once Daily As Needed Allergies 6)  Aspir-Low 81 Mg Tbec (Aspirin) .Marland Kitchen.. 1 By Mouth Once Daily 7)  Lisinopril 20 Mg Tabs (Lisinopril) .Marland Kitchen.. 1po Once Daily 8)  Tramadol Hcl 50 Mg Tabs (Tramadol Hcl) .Marland Kitchen.. 1 By Mouth Q 6 Hrs As Needed 9)  Amlodipine Besylate 5 Mg Tabs (Amlodipine Besylate) .... Take One Tablet By Mouth Daily 10)  Citalopram Hydrobromide 10 Mg Tabs (Citalopram Hydrobromide) .Marland Kitchen.. 1po Once Daily  Current Medications (verified): 1)  Simvastatin 40 Mg Tabs (Simvastatin) .Marland Kitchen.. 1po Once Daily 2)  Cialis 20 Mg Tabs (Tadalafil) .Marland Kitchen.. 1 By Mouth Once Daily As Needed 3)  Protonix 40 Mg Tbec (Pantoprazole Sodium) .Marland Kitchen.. 1 By Mouth Two Times A Day 4)  Vesicare 10 Mg Tabs (Solifenacin Succinate) .Marland Kitchen.. 1 By Mouth Once Daily 5)  Cetirizine Hcl 10 Mg  Tabs (Cetirizine Hcl) .Marland Kitchen.. 1 By Mouth Once Daily  As Needed Allergies 6)  Aspir-Low 81 Mg Tbec (Aspirin) .Marland Kitchen.. 1 By Mouth Once Daily 7)  Lisinopril 20 Mg Tabs (Lisinopril) .Marland Kitchen.. 1po Once Daily 8)  Tylenol With Codeine #3 300-30 Mg Tabs (Acetaminophen-Codeine) .Marland Kitchen.. 1 By Mouth Three Times A Day As Needed 9)  Amlodipine Besylate 5 Mg Tabs (Amlodipine Besylate) .... Take One Tablet By Mouth Daily 10)  Citalopram Hydrobromide 10 Mg Tabs (Citalopram Hydrobromide) .Marland Kitchen.. 1po Once Daily  Allergies (verified): No Known Drug Allergies  Past History:  Past Medical History: Last updated: 01/13/2010 BRADYCARDIA (ICD-427.89) HYPERTENSION (ICD-401.9) HYPERLIPIDEMIA (ICD-272.4) NEPHROLITHIASIS, HX OF (ICD-V13.01) BENIGN PROSTATIC HYPERTROPHY (ICD-600.00) PERSONAL HX COLONIC POLYPS (ICD-V12.72) DIVERTICULOSIS-COLON  (ICD-562.10) ANOREXIA (ICD-783.0) GERD (ICD-530.81) WEIGHT LOSS-ABNORMAL (ICD-783.21) NAUSEA (ICD-787.02) ABDOMINAL PAIN-EPIGASTRIC (ICD-789.06) PANCREATITIS, HX OF (ICD-V12.70) EPIGASTRIC PAIN (ICD-789.06) EPIGASTRIC PAIN (ICD-789.06) FATIGUE (ICD-780.79) DEPRESSION (ICD-311) DIVERTICULOSIS, COLON (ICD-562.10) LOW BACK PAIN (ICD-724.2) GERD (ICD-530.81) ALLERGIC RHINITIS (ICD-477.9) OVERACTIVE BLADDER (ICD-596.51) PREVENTIVE HEALTH CARE (ICD-V70.0)    Past Surgical History: Last updated: 06/05/2009 pacemaker 05/2009  Social History: Last updated: 12/25/2009 retired - post office Former Smoker Alcohol use-no Married 3 children - 1 deceased with homicide Illicit Drug Use - no  Risk Factors: Smoking Status: quit (05/30/2008)  Review of Systems       all otherwise negative per pt -   Physical Exam  General:  alert and well-developed.   Head:  normocephalic and atraumatic.   Eyes:  vision grossly intact, pupils equal, and pupils round.   Ears:  R ear normal and L ear normal.   Nose:  no external deformity and no nasal discharge.   Mouth:  no gingival abnormalities and pharynx pink and moist.   Neck:  supple and no masses.   Lungs:  normal respiratory effort and normal breath sounds.   Heart:  normal rate and regular rhythm.   Msk:  no spine tender , mild tender right achiles tenden onsertion site Extremities:  no edema, no erythema  Neurologic:  cranial nerves II-XII intact and strength normal in all extremities.     Impression & Recommendations:  Problem # 1:  BACK PAIN (ICD-724.5)  His updated medication list for this problem includes:    Aspir-low 81 Mg Tbec (Aspirin) .Marland Kitchen... 1 by mouth once daily    Tylenol With Codeine #3 300-30 Mg Tabs (Acetaminophen-codeine) .Marland Kitchen... 1 by mouth three times a day as needed chronic recurrent right sciatic - stable, but change the tramadol to tylenol #3 as needed   Problem # 2:  ACHILLES TENDINITIS (ICD-726.71) treat as  above, f/u any worsening signs or symptoms , consdier ortho  Problem # 3:  HYPERTENSION (ICD-401.9)  His updated medication list for this problem includes:    Lisinopril 20 Mg Tabs (Lisinopril) .Marland Kitchen... 1po once daily    Amlodipine Besylate 5 Mg Tabs (Amlodipine besylate) .Marland Kitchen... Take one tablet by mouth daily  BP today: 112/72 Prior BP: 160/100 (01/13/2010)  Labs Reviewed: K+: 4.5 (12/25/2009) Creat: : 0.8 (12/25/2009)   Chol: 167 (12/25/2009)   HDL: 72.00 (12/25/2009)   LDL: 88 (12/25/2009)   TG: 36.0 (12/25/2009) stable overall by hx and exam, ok to continue meds/tx as is   Problem # 4:  HYPERLIPIDEMIA (ICD-272.4)  His updated medication list for this problem includes:    Simvastatin 40 Mg Tabs (Simvastatin) .Marland Kitchen... 1po once daily  Labs Reviewed: SGOT: 25 (12/25/2009)   SGPT: 26 (12/25/2009)   HDL:72.00 (12/25/2009), 61.30 (05/27/2009)  LDL:88 (12/25/2009), 102 (11/18/7251)  Chol:167 (12/25/2009), 179 (05/27/2009)  Trig:36.0 (12/25/2009), 77.0 (05/27/2009) stable overall  by hx and exam, ok to continue meds/tx as is   Complete Medication List: 1)  Simvastatin 40 Mg Tabs (Simvastatin) .Marland Kitchen.. 1po once daily 2)  Cialis 20 Mg Tabs (Tadalafil) .Marland Kitchen.. 1 by mouth once daily as needed 3)  Protonix 40 Mg Tbec (Pantoprazole sodium) .Marland Kitchen.. 1 by mouth two times a day 4)  Vesicare 10 Mg Tabs (Solifenacin succinate) .Marland Kitchen.. 1 by mouth once daily 5)  Cetirizine Hcl 10 Mg Tabs (Cetirizine hcl) .Marland Kitchen.. 1 by mouth once daily as needed allergies 6)  Aspir-low 81 Mg Tbec (Aspirin) .Marland Kitchen.. 1 by mouth once daily 7)  Lisinopril 20 Mg Tabs (Lisinopril) .Marland Kitchen.. 1po once daily 8)  Tylenol With Codeine #3 300-30 Mg Tabs (Acetaminophen-codeine) .Marland Kitchen.. 1 by mouth three times a day as needed 9)  Amlodipine Besylate 5 Mg Tabs (Amlodipine besylate) .... Take one tablet by mouth daily 10)  Citalopram Hydrobromide 10 Mg Tabs (Citalopram hydrobromide) .Marland Kitchen.. 1po once daily  Patient Instructions: 1)  Please take all new medications as  prescribed 2)  Continue all previous medications as before this visit  3)  Please schedule a follow-up appointment in 1 year or sooner if needed Prescriptions: TYLENOL WITH CODEINE #3 300-30 MG TABS (ACETAMINOPHEN-CODEINE) 1 by mouth three times a day as needed  #90 x 2   Entered and Authorized by:   Corwin Levins MD   Signed by:   Corwin Levins MD on 01/20/2010   Method used:   Print then Give to Patient   RxID:   [id number]

## 2010-12-16 NOTE — Assessment & Plan Note (Signed)
Summary: 3 MO ROV /NWS   Vital Signs:  Patient profile:   70 year old male Height:      69 inches Weight:      172 pounds BMI:     25.49 O2 Sat:      99 % on Room air Temp:     98.5 degrees F oral Pulse rate:   69 / minute BP sitting:   122 / 80  (left arm) Cuff size:   regular  Vitals Entered By: Zella Ball Ewing CMA (AAMA) (August 21, 2010 8:16 AM)  O2 Flow:  Room air  CC: 3 month ROV/RE   Primary Care Provider:  Bayard Males  CC:  3 month ROV/RE.  History of Present Illness: here for f/u - c/o right post lateral neck pain, worse to turn the head to the right horzontally; used to drive a forklift looking back,  neck pops a lot;  pain off and on, but more freq and severe in the last 2 mo;  both arms already ache  chronically to shoulder, elbows , but no radicaular pain, weak/numb; no bowel s or bladder funciton recent, gait change or falls;  tylenol #3 has worked well in th past; no recent injury or falls  also with nocturia twice per night, bladder does not seem to empty as well as  before, no pain on urination, no blood, fever,  but  seems to have more urgency and freq - seems more realtively sudden occurrence in the past month; has not taken vesicare  in quite some time - months and seemed to do ok - did seem to work when was taking it;  last PSA feb 2011, does not feel bad enoough to see urology at this time,  still with ED symtpoms but cialis too expsensive - ? alternative  Pt denies CP, worsening sob, doe, wheezing, orthopnea, pnd, worsening LE edema, palps, dizziness or syncope  Pt denies new neuro symptoms such as headache, facial or extremity weakness  No fever, wt loss, night sweats, loss of appetite or other constitutional symptoms   Problems Prior to Update: 1)  Neck Pain, Right  (ICD-723.1) 2)  Atrial Flutter, Paroxysmal  (ICD-427.32) 3)  Nausea and Vomiting  (ICD-787.01) 4)  Diarrhea of Presumed Infectious Origin  (ICD-009.3) 5)  Gastroenteritis, Acute   (ICD-558.9) 6)  Rhabdomyolysis  (ICD-728.88) 7)  Special Screening Malig Neoplasms Other Sites  (ICD-V76.49) 8)  Fatigue  (ICD-780.79) 9)  Shoulder Pain, Right  (ICD-719.41) 10)  Achilles Tendinitis  (ICD-726.71) 11)  Back Pain  (ICD-724.5) 12)  Bradycardia  (ICD-427.89) 13)  Hypertension  (ICD-401.9) 14)  Hyperlipidemia  (ICD-272.4) 15)  Nephrolithiasis, Hx of  (ICD-V13.01) 16)  Benign Prostatic Hypertrophy  (ICD-600.00) 17)  Personal Hx Colonic Polyps  (ICD-V12.72) 18)  Diverticulosis-colon  (ICD-562.10) 19)  Anorexia  (ICD-783.0) 20)  Gerd  (ICD-530.81) 21)  Weight Loss-abnormal  (ICD-783.21) 22)  Nausea  (ICD-787.02) 23)  Abdominal Pain-epigastric  (ICD-789.06) 24)  Pancreatitis, Hx of  (ICD-V12.70) 25)  Epigastric Pain  (ICD-789.06) 26)  Epigastric Pain  (ICD-789.06) 27)  Fatigue  (ICD-780.79) 28)  Depression  (ICD-311) 29)  Diverticulosis, Colon  (ICD-562.10) 30)  Low Back Pain  (ICD-724.2) 31)  Gerd  (ICD-530.81) 32)  Allergic Rhinitis  (ICD-477.9) 33)  Overactive Bladder  (ICD-596.51) 34)  Preventive Health Care  (ICD-V70.0)  Medications Prior to Update: 1)  Simvastatin 40 Mg Tabs (Simvastatin) .... 1/2 Po Once Daily 2)  Cialis 20 Mg Tabs (Tadalafil) .Marland Kitchen.. 1 By  Mouth Once Daily As Needed 3)  Cetirizine Hcl 10 Mg  Tabs (Cetirizine Hcl) .Marland Kitchen.. 1 By Mouth Once Daily As Needed Allergies 4)  Aspirin 325 Mg Tabs (Aspirin) .... One By Mouth Qd 5)  Tylenol With Codeine #3 300-30 Mg Tabs (Acetaminophen-Codeine) .Marland Kitchen.. 1 By Mouth Three Times A Day As Needed 6)  Citalopram Hydrobromide 10 Mg Tabs (Citalopram Hydrobromide) .Marland Kitchen.. 1po Once Daily 7)  Promethazine Hcl 25 Mg Tabs (Promethazine Hcl) .Marland Kitchen.. 1 By Mouth Q 6 Hrs Prn Nausea 8)  Lisinopril 20 Mg Tabs (Lisinopril) .Marland Kitchen.. 1po Once Daily 9)  Amlodipine Besylate 5 Mg Tabs (Amlodipine Besylate) .Marland Kitchen.. 1 By Mouth Once Daily  Current Medications (verified): 1)  Simvastatin 40 Mg Tabs (Simvastatin) .... 1/2 Po Once Daily 2)  Levitra 20 Mg  Tabs (Vardenafil Hcl) .Marland Kitchen.. 1po Once Daily As Needed 3)  Cetirizine Hcl 10 Mg  Tabs (Cetirizine Hcl) .Marland Kitchen.. 1 By Mouth Once Daily As Needed Allergies 4)  Aspirin 325 Mg Tabs (Aspirin) .... One By Mouth Qd 5)  Tylenol With Codeine #3 300-30 Mg Tabs (Acetaminophen-Codeine) .Marland Kitchen.. 1 By Mouth Three Times A Day As Needed 6)  Citalopram Hydrobromide 10 Mg Tabs (Citalopram Hydrobromide) .Marland Kitchen.. 1po Once Daily 7)  Promethazine Hcl 25 Mg Tabs (Promethazine Hcl) .Marland Kitchen.. 1 By Mouth Q 6 Hrs Prn Nausea 8)  Lisinopril 20 Mg Tabs (Lisinopril) .Marland Kitchen.. 1po Once Daily 9)  Amlodipine Besylate 5 Mg Tabs (Amlodipine Besylate) .Marland Kitchen.. 1 By Mouth Once Daily 10)  Tamsulosin Hcl 0.4 Mg Caps (Tamsulosin Hcl) .Marland Kitchen.. 1po Once Daily  Allergies (verified): No Known Drug Allergies  Past History:  Social History: Last updated: 12/25/2009 retired - post office Former Smoker Alcohol use-no Married 3 children - 1 deceased with homicide Illicit Drug Use - no  Risk Factors: Smoking Status: quit (05/30/2008)  Past Medical History: Atrial flutter documented by pacemaker 7/11 BRADYCARDIA (ICD-427.89) HYPERTENSION (ICD-401.9) HYPERLIPIDEMIA (ICD-272.4) NEPHROLITHIASIS, HX OF (ICD-V13.01) BENIGN PROSTATIC HYPERTROPHY (ICD-600.00) PERSONAL HX COLONIC POLYPS (ICD-V12.72) DIVERTICULOSIS-COLON (ICD-562.10) ANOREXIA (ICD-783.0) GERD (ICD-530.81) WEIGHT LOSS-ABNORMAL (ICD-783.21) NAUSEA (ICD-787.02) ABDOMINAL PAIN-EPIGASTRIC (ICD-789.06) PANCREATITIS, HX OF (ICD-V12.70) EPIGASTRIC PAIN (ICD-789.06) EPIGASTRIC PAIN (ICD-789.06) FATIGUE (ICD-780.79) DEPRESSION (ICD-311) DIVERTICULOSIS, COLON (ICD-562.10) LOW BACK PAIN (ICD-724.2) GERD (ICD-530.81) ALLERGIC RHINITIS (ICD-477.9) OVERACTIVE BLADDER (ICD-596.51) PREVENTIVE HEALTH CARE (ICD-V70.0) hx of rhabdomyolysis episode may 2011/dehydration  Diverticulosis, colon         MD roster:  urology - [facility]/dr davis                           cards  -   Dr Allred Benign  prostatic hypertrophy  Past Surgical History: Reviewed history from 06/05/2009 and no changes required. pacemaker 05/2009  Review of Systems       all otherwise negative per pt -    Physical Exam  General:  alert and well-developed.   Head:  normocephalic and atraumatic.   Eyes:  vision grossly intact, pupils equal, and pupils round.   Ears:  R ear normal and L ear normal.   Nose:  no external deformity and no nasal discharge.   Mouth:  no gingival abnormalities and pharynx pink and moist.   Neck:  supple and no masses.   Lungs:  normal respiratory effort and normal breath sounds.   Heart:  normal rate and regular rhythm.   Abdomen:  soft, non-tender, and normal bowel sounds.   Rectal:  deferred per pt Msk:  no joint tenderness and no joint swelling.  , right neck without  tedner or sweling or rash Extremities:  no edema, no erythema  Neurologic:  cranial nerves II-XII intact, strength normal in all extremities, gait normal, and DTRs symmetrical and normal.     Impression & Recommendations:  Problem # 1:  NECK PAIN, RIGHT (ICD-723.1)  His updated medication list for this problem includes:    Aspirin 325 Mg Tabs (Aspirin) ..... One by mouth qd    Tylenol With Codeine #3 300-30 Mg Tabs (Acetaminophen-codeine) .Marland Kitchen... 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 1po once daily    Amlodipine Besylate 5 Mg Tabs (Amlodipine besylate) .Marland Kitchen... 1 by mouth once daily  BP today: 122/80 Prior BP: 152/92 (06/10/2010)  Labs Reviewed: K+: 4.2 (05/20/2010) Creat: : 0.9 (05/20/2010)   Chol: 167 (12/25/2009)   HDL: 72.00 (12/25/2009)   LDL: 88 (12/25/2009)   TG: 36.0 (12/25/2009) stable overall by hx and exam, ok to  continue meds/tx as is   Problem # 3:  GERD (ICD-530.81) asympt - no symptoms at this time - ok to stay off nexium at this time, no dysphgai, reflux or pain  Problem # 4:  BENIGN PROSTATIC HYPERTROPHY (ICD-600.00)  His updated medication list for this problem includes:    Tamsulosin Hcl 0.4 Mg Caps (Tamsulosin hcl) .Marland Kitchen... 1po once daily treat as above, f/u any worsening signs or symptoms   Problem # 5:  OVERACTIVE BLADDER (ICD-596.51) declines further vesicare or urology appt at this time  Complete Medication List: 1)  Simvastatin 40 Mg Tabs (Simvastatin) .... 1/2 po once daily 2)  Levitra 20 Mg Tabs (Vardenafil hcl) .Marland Kitchen.. 1po once daily as needed 3)  Cetirizine Hcl 10 Mg Tabs (Cetirizine hcl) .Marland Kitchen.. 1 by mouth once daily as needed allergies 4)  Aspirin 325 Mg Tabs (Aspirin) .... One by mouth qd 5)  Tylenol With Codeine #3 300-30 Mg Tabs (Acetaminophen-codeine) .Marland Kitchen.. 1 by mouth three times a day as needed 6)  Citalopram Hydrobromide 10 Mg Tabs (Citalopram hydrobromide) .Marland Kitchen.. 1po once daily 7)  Promethazine Hcl 25 Mg Tabs (Promethazine hcl) .Marland Kitchen.. 1 by mouth q 6 hrs prn nausea 8)  Lisinopril 20 Mg Tabs (Lisinopril) .Marland Kitchen.. 1po once daily 9)  Amlodipine Besylate 5 Mg Tabs (Amlodipine besylate) .Marland Kitchen.. 1 by mouth once daily 10)  Tamsulosin Hcl 0.4 Mg Caps (Tamsulosin hcl) .Marland Kitchen.. 1po once daily  Patient Instructions: 1)  Please take all new medications as prescribed  - the levitra as needed , and the generic flomax for the prostate to urinate easier 2)  Continue all previous medications as before this visit, except stop the ciallis 3)  You can call to change back to cialis is you want at any time 4)  Please go to Radiology in the basement level for your X-Ray today  5)  Please call the number on the [facility] Card for results of your testing 6)  Please fill out the Medicare form before returning at your next visit 7)  Please schedule a follow-up appointment in 4 months for your "yearly medicare  exam" Prescriptions: TAMSULOSIN HCL 0.4 MG CAPS (TAMSULOSIN HCL) 1po once daily  #90 x 3   Entered and Authorized by:   Corwin Levins MD   Signed by:   Fayrene Fearing  Ellin Mayhew MD on 08/21/2010   Method used:   Print then Give to Patient   RxID:   [id number] LEVITRA 20 MG TABS (VARDENAFIL HCL) 1po once daily as needed  #5 x 11   Entered and Authorized by:   Corwin Levins MD   Signed by:   Corwin Levins MD on 08/21/2010   Method used:   Print then Give to Patient   RxID:   [id number] TYLENOL WITH CODEINE #3 300-30 MG TABS (ACETAMINOPHEN-CODEINE) 1 by mouth three times a day as needed  #90 x 3   Entered and Authorized by:   Corwin Levins MD   Signed by:   Corwin Levins MD on 08/21/2010   Method used:   Print then Give to Patient   RxID:   [id number]

## 2010-12-18 ENCOUNTER — Ambulatory Visit (INDEPENDENT_AMBULATORY_CARE_PROVIDER_SITE_OTHER): Payer: Medicare Other | Admitting: Internal Medicine

## 2010-12-18 ENCOUNTER — Encounter: Payer: Self-pay | Admitting: Internal Medicine

## 2010-12-18 ENCOUNTER — Ambulatory Visit: Admit: 2010-12-18 | Payer: Self-pay | Admitting: Internal Medicine

## 2010-12-18 DIAGNOSIS — I10 Essential (primary) hypertension: Secondary | ICD-10-CM

## 2010-12-18 DIAGNOSIS — M542 Cervicalgia: Secondary | ICD-10-CM

## 2010-12-18 DIAGNOSIS — R0602 Shortness of breath: Secondary | ICD-10-CM | POA: Insufficient documentation

## 2010-12-18 DIAGNOSIS — R55 Syncope and collapse: Secondary | ICD-10-CM

## 2010-12-18 DIAGNOSIS — H6990 Unspecified Eustachian tube disorder, unspecified ear: Secondary | ICD-10-CM

## 2010-12-18 NOTE — Assessment & Plan Note (Signed)
Summary: YEARLY MEDICARE EXAM /NWS  #   Vital Signs:  Patient profile:   70 year old male Height:      69 inches Weight:      196.25 pounds BMI:     29.09 O2 Sat:      98 % on Room air Temp:     98.5 degrees F oral Pulse rate:   69 / minute BP sitting:   120 / 90  (left arm) Cuff size:   regular  Vitals Entered By: Zella Ball Ewing CMA Duncan Dull) (November 24, 2010 8:41 AM)  O2 Flow:  Room air  Preventive Care Screening  Last Flu Shot:    Date:  08/16/2010    Results:  given   CC: Yearly/RE   Primary Care Provider:  Bayard Males  CC:  Yearly/RE.  History of Present Illness: here for f/u;  Pt denies CP, worsening sob, doe, wheezing, orthopnea, pnd, worsening LE edema, palps, dizziness or syncope  Pt denies new neuro symptoms such as headache, facial or extremity weakness  Pt denies polydipsia, polyuria,   Overall good compliance with meds, trying to follow low chol  diet, wt stable, little excercise however .  Has  urinary freq 3-4 times per night;  current meds not working, and tried OAB type meds in the past iwthout much success.  No fever, wt loss, night sweats, loss of appetite or other constitutional symptoms  Overall good compliance with meds, and good tolerability.  Denies worsening depressive symptoms, suicidal ideation, or panic.   Has pain to the right post neck that radiates to the occipital area without UE or LE pain/weak/numb, fever, wt loss, gait change or fall or injury, bowel or bladder change.  Has ongoing mild fatigue as well, but no OSA symptoms.   Problems Prior to Update: 1)  Frequency, Urinary  (ICD-788.41) 2)  Neck Pain, Right  (ICD-723.1) 3)  Atrial Flutter, Paroxysmal  (ICD-427.32) 4)  Nausea and Vomiting  (ICD-787.01) 5)  Diarrhea of Presumed Infectious Origin  (ICD-009.3) 6)  Gastroenteritis, Acute  (ICD-558.9) 7)  Rhabdomyolysis  (ICD-728.88) 8)  Special Screening Malig Neoplasms Other Sites  (ICD-V76.49) 9)  Fatigue  (ICD-780.79) 10)  Shoulder Pain,  Right  (ICD-719.41) 11)  Achilles Tendinitis  (ICD-726.71) 12)  Back Pain  (ICD-724.5) 13)  Bradycardia  (ICD-427.89) 14)  Hypertension  (ICD-401.9) 15)  Hyperlipidemia  (ICD-272.4) 16)  Nephrolithiasis, Hx of  (ICD-V13.01) 17)  Benign Prostatic Hypertrophy  (ICD-600.00) 18)  Personal Hx Colonic Polyps  (ICD-V12.72) 19)  Diverticulosis-colon  (ICD-562.10) 20)  Anorexia  (ICD-783.0) 21)  Gerd  (ICD-530.81) 22)  Weight Loss-abnormal  (ICD-783.21) 23)  Nausea  (ICD-787.02) 24)  Abdominal Pain-epigastric  (ICD-789.06) 25)  Pancreatitis, Hx of  (ICD-V12.70) 26)  Epigastric Pain  (ICD-789.06) 27)  Epigastric Pain  (ICD-789.06) 28)  Fatigue  (ICD-780.79) 29)  Depression  (ICD-311) 30)  Diverticulosis, Colon  (ICD-562.10) 31)  Low Back Pain  (ICD-724.2) 32)  Gerd  (ICD-530.81) 33)  Allergic Rhinitis  (ICD-477.9) 34)  Overactive Bladder  (ICD-596.51) 35)  Preventive Health Care  (ICD-V70.0)  Medications Prior to Update: 1)  Simvastatin 40 Mg Tabs (Simvastatin) .... 1/2 Po Once Daily 2)  Levitra 20 Mg Tabs (Vardenafil Hcl) .Marland Kitchen.. 1po Once Daily As Needed 3)  Cetirizine Hcl 10 Mg  Tabs (Cetirizine Hcl) .Marland Kitchen.. 1 By Mouth Once Daily As Needed Allergies 4)  Aspirin 325 Mg Tabs (Aspirin) .... One By Mouth Qd 5)  Tylenol With Codeine #3 300-30 Mg Tabs (Acetaminophen-Codeine) .Marland KitchenMarland KitchenMarland Kitchen  1 By Mouth Three Times A Day As Needed 6)  Citalopram Hydrobromide 10 Mg Tabs (Citalopram Hydrobromide) .Marland Kitchen.. 1po Once Daily 7)  Promethazine Hcl 25 Mg Tabs (Promethazine Hcl) .Marland Kitchen.. 1 By Mouth Q 6 Hrs Prn Nausea 8)  Lisinopril 20 Mg Tabs (Lisinopril) .Marland Kitchen.. 1po Once Daily 9)  Amlodipine Besylate 5 Mg Tabs (Amlodipine Besylate) .Marland Kitchen.. 1 By Mouth Once Daily 10)  Tamsulosin Hcl 0.4 Mg Caps (Tamsulosin Hcl) .Marland Kitchen.. 1po Once Daily 11)  Gabapentin 300 Mg Caps (Gabapentin) .Marland Kitchen.. 1po  Three Times A Day 12)  Fluticasone Propionate 50 Mcg/act Susp (Fluticasone Propionate) .... 2 Spray/side Once Daily 13)  Omeprazole 20 Mg Cpdr (Omeprazole)  .Marland Kitchen.. 1po Once Daily  Current Medications (verified): 1)  Simvastatin 40 Mg Tabs (Simvastatin) .... 1/2 Po Once Daily 2)  Levitra 20 Mg Tabs (Vardenafil Hcl) .Marland Kitchen.. 1po Once Daily As Needed 3)  Cetirizine Hcl 10 Mg  Tabs (Cetirizine Hcl) .Marland Kitchen.. 1 By Mouth Once Daily As Needed Allergies 4)  Aspirin 325 Mg Tabs (Aspirin) .... One By Mouth Qd 5)  Tylenol With Codeine #3 300-30 Mg Tabs (Acetaminophen-Codeine) .Marland Kitchen.. 1 By Mouth Three Times A Day As Needed 6)  Citalopram Hydrobromide 10 Mg Tabs (Citalopram Hydrobromide) .Marland Kitchen.. 1po Once Daily 7)  Promethazine Hcl 25 Mg Tabs (Promethazine Hcl) .Marland Kitchen.. 1 By Mouth Q 6 Hrs Prn Nausea 8)  Lisinopril 20 Mg Tabs (Lisinopril) .Marland Kitchen.. 1po Once Daily 9)  Amlodipine Besylate 5 Mg Tabs (Amlodipine Besylate) .Marland Kitchen.. 1 By Mouth Once Daily 10)  Tamsulosin Hcl 0.4 Mg Caps (Tamsulosin Hcl) .Marland Kitchen.. 1po Once Daily 11)  Gabapentin 300 Mg Caps (Gabapentin) .Marland Kitchen.. 1po  Three Times A Day 12)  Fluticasone Propionate 50 Mcg/act Susp (Fluticasone Propionate) .... 2 Spray/side Once Daily 13)  Omeprazole 20 Mg Cpdr (Omeprazole) .Marland Kitchen.. 1po Once Daily  Allergies (verified): No Known Drug Allergies  Past History:  Past Medical History: Last updated: 08/21/2010 Atrial flutter documented by pacemaker 7/11 BRADYCARDIA (ICD-427.89) HYPERTENSION (ICD-401.9) HYPERLIPIDEMIA (ICD-272.4) NEPHROLITHIASIS, HX OF (ICD-V13.01) BENIGN PROSTATIC HYPERTROPHY (ICD-600.00) PERSONAL HX COLONIC POLYPS (ICD-V12.72) DIVERTICULOSIS-COLON (ICD-562.10) ANOREXIA (ICD-783.0) GERD (ICD-530.81) WEIGHT LOSS-ABNORMAL (ICD-783.21) NAUSEA (ICD-787.02) ABDOMINAL PAIN-EPIGASTRIC (ICD-789.06) PANCREATITIS, HX OF (ICD-V12.70) EPIGASTRIC PAIN (ICD-789.06) EPIGASTRIC PAIN (ICD-789.06) FATIGUE (ICD-780.79) DEPRESSION (ICD-311) DIVERTICULOSIS, COLON (ICD-562.10) LOW BACK PAIN (ICD-724.2) GERD (ICD-530.81) ALLERGIC RHINITIS (ICD-477.9) OVERACTIVE BLADDER (ICD-596.51) PREVENTIVE HEALTH CARE (ICD-V70.0) hx of rhabdomyolysis  episode may 2011/dehydration  Diverticulosis, colon         MD roster:  urology - [facility]/dr davis                           cards  -   Dr Johney Frame Benign prostatic hypertrophy  Past Surgical History: Last updated: 06/05/2009 pacemaker 05/2009  Social History: Last updated: 12/25/2009 retired - [employer] Smoker Alcohol use-no Married 3 children - 1 deceased with homicide Illicit Drug Use - no  Risk Factors: Smoking Status: quit (05/30/2008)  Review of Systems       all otherwise negative per pt -    Physical Exam  General:  alert and well-developed.   Head:  normocephalic and atraumatic.   Eyes:  vision grossly intact, pupils equal, and pupils round.   Ears:  R ear normal and L ear normal.   Nose:  nasal dischargemucosal pallor and mucosal edema.   Mouth:  pharyngeal erythema and fair dentition.   Neck:  supple and no masses.   Lungs:  normal respiratory effort and normal breath sounds.  Heart:  normal rate and regular rhythm.   Abdomen:  soft, non-tender, and normal bowel sounds.   Msk:  mild to mod right post last neck but FROM, no rash or swelling Extremities:  no edema, no erythema  Neurologic:  cranial nerves II-XII intact, strength normal in all extremities, gait normal, and DTRs symmetrical and normal.   Psych:  not depressed appearing and slightly anxious.     Impression & Recommendations:  Problem # 1:  FREQUENCY, URINARY (ICD-788.41)  His updated medication list for this problem includes:    Tamsulosin Hcl 0.4 Mg Caps (Tamsulosin hcl) .Marland Kitchen... 1po once daily  Orders: T-Culture, Urine ([id number]) TLB-Udip w/ Micro (81001-URINE) Urology Referral (Urology) ? etiology- check urine studies - r/o UTI, Continue all previous medications as before this visit, refer urology per pt request  Problem # 2:  HYPERTENSION (ICD-401.9)  His updated medication list for this problem includes:    Lisinopril 20 Mg Tabs (Lisinopril) .Marland Kitchen... 1po once daily     Amlodipine Besylate 5 Mg Tabs (Amlodipine besylate) .Marland Kitchen... 1 by mouth once daily  BP today: 120/90 Prior BP: 132/80 (10/21/2010)  Labs Reviewed: K+: 4.2 (05/20/2010) Creat: : 0.9 (05/20/2010)   Chol: 167 (12/25/2009)   HDL: 72.00 (12/25/2009)   LDL: 88 (12/25/2009)   TG: 36.0 (12/25/2009) stable overall by hx and exam, ok to continue meds/tx as is   Problem # 3:  NECK PAIN, RIGHT (ICD-723.1)  His updated medication list for this problem includes:    Aspirin 325 Mg Tabs (Aspirin) ..... One by mouth qd    Tylenol With Codeine #3 300-30 Mg Tabs (Acetaminophen-codeine) .Marland Kitchen... 1 by mouth three times a day as needed ? occipital neuralgia - refer neurology  Orders: Neurology Referral (Neuro)  Problem # 4:  FATIGUE (ICD-780.79) exam benign, to check labs below; follow with expectant management  Orders: TLB-BMP (Basic Metabolic Panel-BMET) (80048-METABOL) TLB-CBC Platelet - w/Differential (85025-CBCD) TLB-Hepatic/Liver Function Pnl (80076-HEPATIC) TLB-TSH (Thyroid Stimulating Hormone) (84443-TSH)  Problem # 5:  HYPERLIPIDEMIA (ICD-272.4)  His updated medication list for this problem includes:    Simvastatin 40 Mg Tabs (Simvastatin) .Marland Kitchen... 1/2 po once daily  Labs Reviewed: SGOT: 34 (05/20/2010)   SGPT: 23 (05/20/2010)   HDL:72.00 (12/25/2009), 61.30 (05/27/2009)  LDL:88 (12/25/2009), 102 (33/29/5188)  Chol:167 (12/25/2009), 179 (05/27/2009)  Trig:36.0 (12/25/2009), 77.0 (05/27/2009) stable overall by hx and exam, ok to continue meds/tx as is, Pt to continue diet efforts, good med tolerance; to check labs - goal LDL less than 70   Orders: TLB-Lipid Panel (80061-LIPID)  Complete Medication List: 1)  Simvastatin 40 Mg Tabs (Simvastatin) .... 1/2 po once daily 2)  Levitra 20 Mg Tabs (Vardenafil hcl) .Marland Kitchen.. 1po once daily as needed 3)  Cetirizine Hcl 10 Mg Tabs (Cetirizine hcl) .Marland Kitchen.. 1 by mouth once daily as needed allergies 4)  Aspirin 325 Mg Tabs (Aspirin) .... One by mouth qd 5)   Tylenol With Codeine #3 300-30 Mg Tabs (Acetaminophen-codeine) .Marland Kitchen.. 1 by mouth three times a day as needed 6)  Citalopram Hydrobromide 10 Mg Tabs (Citalopram hydrobromide) .Marland Kitchen.. 1po once daily 7)  Promethazine Hcl 25 Mg Tabs (Promethazine hcl) .Marland Kitchen.. 1 by mouth q 6 hrs prn nausea 8)  Lisinopril 20 Mg Tabs (Lisinopril) .Marland Kitchen.. 1po once daily 9)  Amlodipine Besylate 5 Mg Tabs (Amlodipine besylate) .Marland Kitchen.. 1 by mouth once daily 10)  Tamsulosin Hcl 0.4 Mg Caps (Tamsulosin hcl) .Marland Kitchen.. 1po once daily 11)  Gabapentin 300 Mg Caps (Gabapentin) .Marland Kitchen.. 1po  three times a day 12)  Fluticasone Propionate 50 Mcg/act Susp (Fluticasone propionate) .... 2 spray/side once daily 13)  Omeprazole 20 Mg Cpdr (Omeprazole) .Marland Kitchen.. 1po once daily  Other Orders: EKG w/ Interpretation (93000) TLB-PSA (Prostate Specific Antigen) (84153-PSA)  Patient Instructions: 1)  Continue all previous medications as before this visit 2)  Please go to the Lab in the basement for your blood and/or urine tests today 3)  Please call the number on the [facility] Card for results of your testing 4)  Please call for refills as you need them 5)  You will be contacted about the referral(s) to: urology and neurology 6)  Please schedule a follow-up appointment in 6 months.   Orders Added: 1)  EKG w/ Interpretation [93000] 2)  T-Culture, Urine [[id number]] 3)  TLB-Lipid Panel [80061-LIPID] 4)  TLB-BMP (Basic Metabolic Panel-BMET) [80048-METABOL] 5)  TLB-CBC Platelet - w/Differential [85025-CBCD] 6)  TLB-Hepatic/Liver Function Pnl [80076-HEPATIC] 7)  TLB-TSH (Thyroid Stimulating Hormone) [84443-TSH] 8)  TLB-PSA (Prostate Specific Antigen) [84153-PSA] 9)  TLB-Udip w/ Micro [81001-URINE] 10)  Neurology Referral [Neuro] 11)  Urology Referral [Urology] 12)  Est. Patient Level IV [84166]

## 2010-12-18 NOTE — Progress Notes (Signed)
Summary: Call Report/JWJ pt  Phone Note Other Incoming   Caller: [facility] Summary of Call: [facility] Triage Call Report Triage Record Num: [id number] Operator: Arline Asp Loftin Patient Name: [name] Call Date & Time: 12/02/2010 9:02:39PM Patient Phone: [phone] PCP: Oliver Barre Patient Gender: Male PCP Fax : [phone] Patient DOB: 1941/09/19 Practice Name: [facility] Reason for Call: Pt "got up to answer the door, felt dizzy, then passed out" 01/17 at 1400. "Busted lip." Bleeding has stopped. Pt "to have appt with neurologist scheduled for pains in back of head." Emergent sx negative. Care advice per "Fainting Adult Protocol." Instructed to follow up with PCP 01/18. Call back parameters reviewed. Protocol(s) Used: Fainting Recommended Outcome per Protocol: See Provider within 24 hours Reason for Outcome: Taking prescription or nonprescription medication(s), especially antihypertensives, sedatives, tranquilizers, sleep-aids, antihistamines, vasodilators, antidepressants, diuretics, or NSAIDs (such as aspirin, ibuprofen, ketoprofen, naproxen, etc.) Care Advice: Call provider during regular office hours to inform of symptoms and receive instructions regarding continuing or changing medication or treatment plan.  ~ Call EMS 911 if patient takes longer than a few seconds to regain consciousness, has another fainting episode, slurred speech, difficulty moving an arm or leg, mental status changes such as confusion or lethargy, or had an injury from falling.  ~ A temporary drop in blood pressure sometimes occurs with a quick change to an upright position (postural hypotension) and may cause light-headedness or dizziness. Change position slowly. Making a habit of rising slowly and sitting for a few minutes before standing to walk usually relieves the feeling of faintness.  ~ 01/ Initial call taken by: Margaret Pyle, CMA,  December 03, 2010 8:47 AM  Follow-up for  Phone Call        Pt called back this morning requesting appt, okay to scheduled acute appt with VAL? Follow-up by: Margaret Pyle, CMA,  December 03, 2010 8:48 AM  Additional Follow-up for Phone Call Additional follow up Details #1::        ok Additional Follow-up by: Newt Lukes MD,  December 03, 2010 9:42 AM

## 2010-12-18 NOTE — Progress Notes (Signed)
  Phone Note Refill Request Message from:  Fax from Pharmacy on December 02, 2010 9:55 AM  Refills Requested: Medication #1:  AMLODIPINE BESYLATE 5 MG TABS 1 by mouth once daily   Dosage confirmed as above?Dosage Confirmed   Last Refilled: 07/30/2010   Notes: [facility] Initial call taken by: Robin Ewing CMA (AAMA),  December 02, 2010 9:56 AM    Prescriptions: AMLODIPINE BESYLATE 5 MG TABS (AMLODIPINE BESYLATE) 1 by mouth once daily  #90 x 3   Entered by:   Scharlene Gloss CMA (AAMA)   Authorized by:   Corwin Levins MD   Signed by:   Scharlene Gloss CMA (AAMA) on 12/02/2010   Method used:   Faxed to ...       [facility] (mail-order)             , [place]         Ph: [phone]       Fax: [phone]   RxID:   [id number]

## 2010-12-18 NOTE — Cardiovascular Report (Signed)
Summary: Office Visit   Office Visit   Imported By: Roderic Ovens 12/08/2010 11:03:47  _____________________________________________________________________  External Attachment:    Type:   Image     Comment:   External Document

## 2010-12-18 NOTE — Progress Notes (Signed)
Summary: medication Refill  Phone Note Refill Request Message from:  Fax from Pharmacy on December 02, 2010 9:57 AM  Refills Requested: Medication #1:  TYLENOL WITH CODEINE #3 300-30 MG TABS 1 by mouth three times a day as needed   Dosage confirmed as above?Dosage Confirmed   Last Refilled: 08/21/2010   Notes: [facility], fax#[phone] Initial call taken by: Zella Ball Ewing CMA Duncan Dull),  December 02, 2010 9:58 AM  Follow-up for Phone Call        i printed Follow-up by: Minus Breeding MD,  December 02, 2010 12:30 PM  Additional Follow-up for Phone Call Additional follow up Details #1::        Faxed hardcopy to patients pharmacy. Additional Follow-up by: Robin Ewing CMA (AAMA),  December 02, 2010 1:20 PM    Prescriptions: TYLENOL WITH CODEINE #3 300-30 MG TABS (ACETAMINOPHEN-CODEINE) 1 by mouth three times a day as needed  #90 x 0   Entered and Authorized by:   Minus Breeding MD   Signed by:   Minus Breeding MD on 12/02/2010   Method used:   Print then Give to Patient   RxID:   [id number]

## 2010-12-18 NOTE — Assessment & Plan Note (Signed)
Summary: fainting/jwj/lb   Vital Signs:  Patient profile:   70 year old male Height:      69 inches (175.26 cm) Weight:      203.2 pounds (92.36 kg) O2 Sat:      95 % on Room air Temp:     97.9 degrees F (36.61 degrees C) oral Pulse rate:   69 / minute BP sitting:   122 / 82  (left arm) Cuff size:   regular  Vitals Entered By: Orlan Leavens RMA (December 04, 2010 9:24 AM)  O2 Flow:  Room air CC: Sycope Is Patient Diabetic? No Pain Assessment Patient in pain? no      Comments Pt states on Tuesday he pass out. Everything just got blurry to him. Denies any SOB, CP. Saw urologist this am was told to d/c tamusolin   Primary Care Provider:  Bayard Males  CC:  Sycope.  History of Present Illness: here for f/u syncope event occured 2 days ago precipitated by positional change - quickly got up from reclined sitting to go open door - a/w prodromal "foggy head and blurring" - then awoke on floor unwitnessed event as he sent the neightbor at door away "b/c i didn't feel like company" no incontinence - denies injury or head trama mild residual HA, no weakness no hx same no CP or recurrent syncope or dizziness no confusion or excess sleepiness/sedation per spouse uro eval this am stopped tamsulosin as med not helping bph symptoms   last OV reviewed with JWJ:  here for f/u;  Pt denies CP, worsening sob, doe, wheezing, orthopnea, pnd, worsening LE edema, palps, dizziness or syncope  Pt denies new neuro symptoms such as headache, facial or extremity weakness  Pt denies polydipsia, polyuria,   Overall good compliance with meds, trying to follow low chol  diet, wt stable, little excercise however .  Has  urinary freq 3-4 times per night;  current meds not working, and tried OAB type meds in the past iwthout much success.  No fever, wt loss, night sweats, loss of appetite or other constitutional symptoms  Overall good compliance with meds, and good tolerability.  Denies worsening depressive  symptoms, suicidal ideation, or panic.   Has pain to the right post neck that radiates to the occipital area without UE or LE pain/weak/numb, fever, wt loss, gait change or fall or injury, bowel or bladder change.  Has ongoing mild fatigue as well, but no OSA symptoms.   Current Medications (verified): 1)  Simvastatin 40 Mg Tabs (Simvastatin) .... 1/2 Po Once Daily 2)  Levitra 20 Mg Tabs (Vardenafil Hcl) .Marland Kitchen.. 1po Once Daily As Needed 3)  Cetirizine Hcl 10 Mg  Tabs (Cetirizine Hcl) .Marland Kitchen.. 1 By Mouth Once Daily As Needed Allergies 4)  Aspirin 325 Mg Tabs (Aspirin) .... One By Mouth Qd 5)  Tylenol With Codeine #3 300-30 Mg Tabs (Acetaminophen-Codeine) .Marland Kitchen.. 1 By Mouth Three Times A Day As Needed 6)  Citalopram Hydrobromide 10 Mg Tabs (Citalopram Hydrobromide) .Marland Kitchen.. 1po Once Daily 7)  Promethazine Hcl 25 Mg Tabs (Promethazine Hcl) .Marland Kitchen.. 1 By Mouth Q 6 Hrs Prn Nausea 8)  Lisinopril 20 Mg Tabs (Lisinopril) .Marland Kitchen.. 1po Once Daily 9)  Amlodipine Besylate 5 Mg Tabs (Amlodipine Besylate) .Marland Kitchen.. 1 By Mouth Once Daily 10)  Gabapentin 300 Mg Caps (Gabapentin) .Marland Kitchen.. 1po  Three Times A Day 11)  Fluticasone Propionate 50 Mcg/act Susp (Fluticasone Propionate) .... 2 Spray/side Once Daily 12)  Omeprazole 20 Mg Cpdr (Omeprazole) .Marland Kitchen.. 1po Once Daily  Allergies (verified): No Known Drug Allergies  Past History:  Past Medical History: Atrial flutter documented by pacemaker 7/11  BRADYCARDIA (ICD-427.89) HYPERTENSION (ICD-401.9) HYPERLIPIDEMIA (ICD-272.4) BENIGN PROSTATIC HYPERTROPHY (ICD-600.00) DIVERTICULOSIS-COLON (ICD-562.10) ANOREXIA (ICD-783.0) GERD (ICD-530.81) WEIGHT LOSS-ABNORMAL (ICD-783.21) NAUSEA (ICD-787.02) ABDOMINAL PAIN-EPIGASTRIC (ICD-789.06) PANCREATITIS, HX OF (ICD-V12.70) EPIGASTRIC PAIN (ICD-789.06) EPIGASTRIC PAIN (ICD-789.06) FATIGUE (ICD-780.79) DEPRESSION (ICD-311) DIVERTICULOSIS, COLON (ICD-562.10) LOW BACK PAIN (ICD-724.2) GERD (ICD-530.81) ALLERGIC RHINITIS (ICD-477.9) OVERACTIVE  BLADDER (ICD-596.51) hx of rhabdomyolysis episode may 2011/dehydration  Diverticulosis, colon         MD roster:  [facility]rology - [facility]/dr davis/[place]                           cards  -   Dr Johney Frame  Review of Systems  The patient denies fever, vision loss, decreased hearing, chest pain, dyspnea on exertion, peripheral edema, and difficulty walking.    Physical Exam  General:  alert and well-developed.  NAD - wife at side Head:  Normocephalic and atraumatic without obvious abnormalities. No apparent alopecia or balding. Eyes:  vision grossly intact; pupils equal, round and reactive to light.  conjunctiva and lids normal.    Ears:  R ear normal and L ear normal.   Lungs:  normal respiratory effort and normal breath sounds.   Heart:  normal rate and regular rhythm.   Neurologic:  cranial nerves II-XII intact, strength normal in all extremities, gait normal, and DTRs symmetrical and normal.     Impression & Recommendations:  Problem # 1:  FREQUENCY, URINARY (ICD-788.41) syncope event likely orthostatic hypotension with sudden position change hx no abn on neuro exam but c/o mild HA - will check had ct (no mri due to ppm) neuro referral made by PCP last visit - will f/u on status of same agree with stopping bph tx as per uro this am - plan for cysto and ?turp as per pt report  The following medications were removed from the medication list:    Tamsulosin Hcl 0.4 Mg Caps (Tamsulosin hcl) .Marland Kitchen... 1po once daily  Problem # 2:  SYNCOPE (ICD-780.2) see above - orthostatic hypotension event most likely head ct now and neuro eval as prev referred Orders: Misc. Referral (Misc. Ref)  Complete Medication List: 1)  Simvastatin 40 Mg Tabs (Simvastatin) .... 1/2 po once daily 2)  Levitra 20 Mg Tabs (Vardenafil hcl) .Marland Kitchen.. 1po once daily as needed 3)  Cetirizine Hcl 10 Mg Tabs (Cetirizine hcl) .Marland Kitchen.. 1 by mouth once daily as needed allergies 4)  Aspirin 325 Mg Tabs (Aspirin) .... One by mouth  qd 5)  Tylenol With Codeine #3 300-30 Mg Tabs (Acetaminophen-codeine) .Marland Kitchen.. 1 by mouth three times a day as needed 6)  Citalopram Hydrobromide 10 Mg Tabs (Citalopram hydrobromide) .Marland Kitchen.. 1po once daily 7)  Promethazine Hcl 25 Mg Tabs (Promethazine hcl) .Marland Kitchen.. 1 by mouth q 6 hrs prn nausea 8)  Lisinopril 20 Mg Tabs (Lisinopril) .Marland Kitchen.. 1po once daily 9)  Amlodipine Besylate 5 Mg Tabs (Amlodipine besylate) .Marland Kitchen.. 1 by mouth once daily 10)  Gabapentin 300 Mg Caps (Gabapentin) .Marland Kitchen.. 1po  three times a day 11)  Fluticasone Propionate 50 Mcg/act Susp (Fluticasone propionate) .... 2 spray/side once daily 12)  Omeprazole 20 Mg Cpdr (Omeprazole) .Marland Kitchen.. 1po once daily  Patient Instructions: 1)  it was good to see you today. 2)  stay off tamulosin as per urology 3)  we'll make referral for CT head scan. Our office will contact you regarding this appointment once  made.  will also followup on status of neuro referral as per dr. Jonny Ruiz 4)  move slowly when standing up after sitting for 1st few steps - stop if dizzy or blurry vision and sit down 5)  Please schedule a follow-up appointment with Dr. Jonny Ruiz in 2 weeks, call sooner if problems.    Orders Added: 1)  Misc. Referral [Misc. Ref] 2)  Est. Patient Level IV [72536]

## 2010-12-18 NOTE — Letter (Signed)
Summary: [facility]  [facility]   Imported By: Lester Harlowton 12/11/2010 10:01:24  _____________________________________________________________________  External Attachment:    Type:   Image     Comment:   External Document

## 2010-12-18 NOTE — Letter (Addendum)
Summary: Primary Care Consult Scheduled Letter  [facility]  [address] [place], [place] [address]   Phone: [phone]  Fax: [phone]      12/09/2010 MRN: [id number]  Nghia Cuffie [address] [place], [place]  [address]    Dear Mr. Mol,      We have scheduled an appointment for you.  At the recommendation of Dr.John, we have scheduled you a consult with [facility] (Dr. Anne Hahn) on April 10.2012 at9:00 am. Their address is [address], [address], [place], [place] [address]. The office phone number is [phone].  If this appointment day and time is not convenient for you, please feel free to call the office of the doctor you are being referred to at the number listed above and reschedule the appointment.     It is important for you to keep your scheduled appointments. We are here to make sure you are given good patient care. If you have questions or you have made changes to your appointment, please notify us at  [phone]        , ask for Debra             .        Thank you,  Patient Care Coordinator [facility]

## 2010-12-18 NOTE — Progress Notes (Signed)
Summary: rx refill req  Phone Note Refill Request Message from:  Fax from Pharmacy on December 05, 2010 3:23 PM  Refills Requested: Medication #1:  SIMVASTATIN 40 MG TABS 1/2 po once daily   Dosage confirmed as above?Dosage Confirmed  Medication #2:  GABAPENTIN 300 MG CAPS 1po  three times a day   Dosage confirmed as above?Dosage Confirmed  Medication #3:  LISINOPRIL 20 MG TABS 1po once daily   Dosage confirmed as above?Dosage Confirmed  Medication #4:  FLUTICASONE PROPIONATE 50 MCG/ACT SUSP 2 spray/side once daily   Dosage confirmed as above?Dosage Confirmed 90 day supply-[facility]   Method Requested: Fax to Mail Away Pharmacy Next Appointment Scheduled: 12/18/2010 Initial call taken by: Brenton Grills CMA (AAMA),  December 05, 2010 3:24 PM    Prescriptions: FLUTICASONE PROPIONATE 50 MCG/ACT SUSP (FLUTICASONE PROPIONATE) 2 spray/side once daily  #3 month x 3   Entered by:   Brenton Grills CMA (AAMA)   Authorized by:   Corwin Levins MD   Signed by:   Brenton Grills CMA (AAMA) on 12/05/2010   Method used:   Faxed to ...       [facility] (mail-order)             , [place]         Ph: [phone]       Fax: [phone]   RxID:   [id number] GABAPENTIN 300 MG CAPS (GABAPENTIN) 1po  three times a day  #90 x 3   Entered by:   Brenton Grills CMA (AAMA)   Authorized by:   Corwin Levins MD   Signed by:   Brenton Grills CMA (AAMA) on 12/05/2010   Method used:   Faxed to ...       [facility] (mail-order)             , [place]         Ph: [phone]       Fax: [phone]   RxID:   [id number] LISINOPRIL 20 MG TABS (LISINOPRIL) 1po once daily  #90 x 3   Entered by:   Brenton Grills CMA (AAMA)   Authorized by:   Corwin Levins MD   Signed by:   Brenton Grills CMA (AAMA) on 12/05/2010   Method used:   Faxed to ...       [facility] (mail-order)             , [place]         Ph: [phone]       Fax: [phone]   RxID:   [id number] SIMVASTATIN 40 MG TABS (SIMVASTATIN) 1/2 po once  daily  #45 x 3   Entered by:   Brenton Grills CMA (AAMA)   Authorized by:   Corwin Levins MD   Signed by:   Brenton Grills CMA (AAMA) on 12/05/2010   Method used:   Faxed to ...       [facility] (mail-order)             , [place]         Ph: [phone]       Fax: [phone]   RxID:   [id number]

## 2010-12-18 NOTE — Procedures (Signed)
Summary: pc2 guidant      Allergies Added: NKDA  Current Medications (verified): 1)  Simvastatin 40 Mg Tabs (Simvastatin) .... 1/2 Po Once Daily 2)  Levitra 20 Mg Tabs (Vardenafil Hcl) .Marland Kitchen.. 1po Once Daily As Needed 3)  Cetirizine Hcl 10 Mg  Tabs (Cetirizine Hcl) .Marland Kitchen.. 1 By Mouth Once Daily As Needed Allergies 4)  Aspirin 325 Mg Tabs (Aspirin) .... One By Mouth Qd 5)  Tylenol With Codeine #3 300-30 Mg Tabs (Acetaminophen-Codeine) .Marland Kitchen.. 1 By Mouth Three Times A Day As Needed 6)  Citalopram Hydrobromide 10 Mg Tabs (Citalopram Hydrobromide) .Marland Kitchen.. 1po Once Daily 7)  Promethazine Hcl 25 Mg Tabs (Promethazine Hcl) .Marland Kitchen.. 1 By Mouth Q 6 Hrs Prn Nausea 8)  Lisinopril 20 Mg Tabs (Lisinopril) .Marland Kitchen.. 1po Once Daily 9)  Amlodipine Besylate 5 Mg Tabs (Amlodipine Besylate) .Marland Kitchen.. 1 By Mouth Once Daily 10)  Tamsulosin Hcl 0.4 Mg Caps (Tamsulosin Hcl) .Marland Kitchen.. 1po Once Daily 11)  Gabapentin 300 Mg Caps (Gabapentin) .Marland Kitchen.. 1po  Three Times A Day 12)  Fluticasone Propionate 50 Mcg/act Susp (Fluticasone Propionate) .... 2 Spray/side Once Daily 13)  Omeprazole 20 Mg Cpdr (Omeprazole) .Marland Kitchen.. 1po Once Daily  Allergies (verified): No Known Drug Allergies  PPM Specifications Following MD:  Hillis Range, MD     PPM Vendor:  [employer]     PPM Serial Number:  [id number] PPM DOI:  06/03/2009     PPM Implanting MD:  Hillis Range, MD Research Study Name Sinus node dysfunction/[employer]  Lead 1    Location: RA     DOI: 06/03/2009     Model #: [id number]     Serial #: [id number]     Status: active Lead 2    Location: RV     DOI: 06/03/2009     Model #: 4137     Serial #: [id number]     Status: active  Magnet Response Rate:  BOL 100 ERI 85  Indications:  Sick sinus syndrome   PPM Follow Up Remote Check?  No Battery Voltage:  good V     Battery Est. Longevity:  5 years     Pacer Dependent:  No       PPM Device Measurements Atrium  Amplitude: 3.0 mV, Impedance: 500 ohms, Threshold: 0.6 V at 0.4 msec Right Ventricle   Amplitude: 9.8 mV, Impedance: 710 ohms, Threshold: 1.6 V at 0.4 msec  Episodes MS Episodes:  1     Percent Mode Switch:  0%     Coumadin:  No Ventricular High Rate:  1     Atrial Pacing:  72%     Ventricular Pacing:  2%  Parameters Mode:  DDDR     Lower Rate Limit:  60     Upper Rate Limit:  150 Paced AV Delay:  400     Sensed AV Delay:  100 Next Cardiology Appt Due:  05/17/2011 Tech Comments:  RV reprogrammed for higher threshold value.  Mode switch and VHR probable SVT.  ROV 6 months with Dr. Johney Frame. Altha Harm, LPN  November 26, 2010 2:35 PM

## 2010-12-23 ENCOUNTER — Encounter: Payer: Self-pay | Admitting: Internal Medicine

## 2010-12-24 NOTE — Assessment & Plan Note (Signed)
Summary: 2 WEEK FOLLOWUP --STC   Vital Signs:  Patient profile:   70 year old male Height:      69 inches Weight:      198.50 pounds BMI:     29.42 O2 Sat:      95 % on Room air Temp:     98.9 degrees F oral Pulse rate:   75 / minute BP supine:   122 / 86 BP sitting:   124 / 88  (left arm) BP standing:   122 / 92 Cuff size:   regular  Vitals Entered By: Zella Ball Ewing CMA (AAMA) (December 18, 2010 8:18 AM)  O2 Flow:  Room air  CC: 2 week followup/RE   Primary Care Provider:  Bayard Males  CC:  2 week followup/RE.  History of Present Illness: here to f/u recent visit with Dr Felicity Coyer with episode of syncope;  see that visit for details,  no recurrence of symptoms, and Pt denies CP, worsening sob, doe, wheezing, orthopnea, pnd, worsening LE edema, palps, further dizziness or syncope.  Did stop the alpha blocker.  Has appt for neuro april, recent CT head neg for acute (not able to do MRI due to pacemaker);  Pt denies new neuro symptoms such as headache, facial or extremity weakness,  No suggestion of siezure with last episode syncope., only out for 3-4 seconds and immed cognizant fully after, but did split the lower lip with fall he could not control.  Right wrist aching from the fall and split lip have resolved.  Last echo aug 2010.  Just saw Dr Johney Frame for device check approx 3 wks ago.  No carotids or recent echo done, or orthostatics. Takes by mouth well, no abd pain/n/v/diarrhea.   Does have also ongoing  right post neck pain, recurring, more severe and somewhat radiating to the right occiput area, ? worse after fall on carpet and split lip but not clear as he has been having signficant pain prior;  gabapentin had been doing well, but no longer controlling symptoms. No sleepiness or other gabapentin side effect.  State pain particularly worse at night, has found no pillow that improves his pain, so sleeps flat on bed without pillow.  No UE pain/numb/weak/ bowel or bladder change, fever,  gait chagne, fall or unusual wt loss.  Fairly recent film confirmed prior clinical impression of cervical spondylosis.  Has not seen surgury or neuro for this.    also mentions bilat ear tiniitus and fullness with popping for wks without recent known URI or sinusitis;  has ongoing nasal allergy symptoms for which he does not use his meds regularly;  valsalva maneuver tends to help the ear fullness, not using mucinex, and recent syncope not assoc with valsalva effort  Problems Prior to Update: 1)  Unspecified Eustachian Tube Disorder  (ICD-381.9) 2)  Syncope  (ICD-780.2) 3)  Frequency, Urinary  (ICD-788.41) 4)  Neck Pain, Right  (ICD-723.1) 5)  Atrial Flutter, Paroxysmal  (ICD-427.32) 6)  Nausea and Vomiting  (ICD-787.01) 7)  Diarrhea of Presumed Infectious Origin  (ICD-009.3) 8)  Rhabdomyolysis  (ICD-728.88) 9)  Fatigue  (ICD-780.79) 10)  Shoulder Pain, Right  (ICD-719.41) 11)  Achilles Tendinitis  (ICD-726.71) 12)  Bradycardia  (ICD-427.89) 13)  Hypertension  (ICD-401.9) 14)  Hyperlipidemia  (ICD-272.4) 15)  Benign Prostatic Hypertrophy  (ICD-600.00) 16)  Personal Hx Colonic Polyps  (ICD-V12.72) 17)  Diverticulosis-colon  (ICD-562.10) 18)  Gerd  (ICD-530.81) 19)  Weight Loss-abnormal  (ICD-783.21) 20)  Nausea  (ICD-787.02) 21)  Fatigue  (ICD-780.79) 22)  Depression  (ICD-311) 23)  Diverticulosis, Colon  (ICD-562.10) 24)  Low Back Pain  (ICD-724.2) 25)  Gerd  (ICD-530.81) 26)  Allergic Rhinitis  (ICD-477.9) 27)  Overactive Bladder  (ICD-596.51) 28)  Preventive Health Care  (ICD-V70.0)  Medications Prior to Update: 1)  Simvastatin 40 Mg Tabs (Simvastatin) .... 1/2 Po Once Daily 2)  Levitra 20 Mg Tabs (Vardenafil Hcl) .Marland Kitchen.. 1po Once Daily As Needed 3)  Cetirizine Hcl 10 Mg  Tabs (Cetirizine Hcl) .Marland Kitchen.. 1 By Mouth Once Daily As Needed Allergies 4)  Aspirin 325 Mg Tabs (Aspirin) .... One By Mouth Qd 5)  Tylenol With Codeine #3 300-30 Mg Tabs (Acetaminophen-Codeine) .Marland Kitchen.. 1 By Mouth  Three Times A Day As Needed 6)  Citalopram Hydrobromide 10 Mg Tabs (Citalopram Hydrobromide) .Marland Kitchen.. 1po Once Daily 7)  Promethazine Hcl 25 Mg Tabs (Promethazine Hcl) .Marland Kitchen.. 1 By Mouth Q 6 Hrs Prn Nausea 8)  Lisinopril 20 Mg Tabs (Lisinopril) .Marland Kitchen.. 1po Once Daily 9)  Amlodipine Besylate 5 Mg Tabs (Amlodipine Besylate) .Marland Kitchen.. 1 By Mouth Once Daily 10)  Gabapentin 300 Mg Caps (Gabapentin) .Marland Kitchen.. 1po  Three Times A Day 11)  Fluticasone Propionate 50 Mcg/act Susp (Fluticasone Propionate) .... 2 Spray/side Once Daily 12)  Omeprazole 20 Mg Cpdr (Omeprazole) .Marland Kitchen.. 1po Once Daily  Current Medications (verified): 1)  Simvastatin 40 Mg Tabs (Simvastatin) .... 1/2 Po Once Daily 2)  Levitra 20 Mg Tabs (Vardenafil Hcl) .Marland Kitchen.. 1po Once Daily As Needed 3)  Cetirizine Hcl 10 Mg  Tabs (Cetirizine Hcl) .Marland Kitchen.. 1 By Mouth Once Daily As Needed Allergies 4)  Aspirin 325 Mg Tabs (Aspirin) .... One By Mouth Qd 5)  Tylenol With Codeine #3 300-30 Mg Tabs (Acetaminophen-Codeine) .Marland Kitchen.. 1 By Mouth Three Times A Day As Needed 6)  Citalopram Hydrobromide 10 Mg Tabs (Citalopram Hydrobromide) .Marland Kitchen.. 1po Once Daily 7)  Promethazine Hcl 25 Mg Tabs (Promethazine Hcl) .Marland Kitchen.. 1 By Mouth Q 6 Hrs Prn Nausea 8)  Lisinopril 20 Mg Tabs (Lisinopril) .Marland Kitchen.. 1po Once Daily 9)  Amlodipine Besylate 5 Mg Tabs (Amlodipine Besylate) .Marland Kitchen.. 1 By Mouth Once Daily 10)  Gabapentin 300 Mg Caps (Gabapentin) .... 2 Po  Three Times A Day 11)  Fluticasone Propionate 50 Mcg/act Susp (Fluticasone Propionate) .... 2 Spray/side Once Daily 12)  Omeprazole 20 Mg Cpdr (Omeprazole) .Marland Kitchen.. 1po Once Daily  Allergies (verified): No Known Drug Allergies  Past History:  Past Medical History: Last updated: 12/04/2010 Atrial flutter documented by pacemaker 7/11  BRADYCARDIA (ICD-427.89) HYPERTENSION (ICD-401.9) HYPERLIPIDEMIA (ICD-272.4) BENIGN PROSTATIC HYPERTROPHY (ICD-600.00) DIVERTICULOSIS-COLON (ICD-562.10) ANOREXIA (ICD-783.0) GERD (ICD-530.81) WEIGHT LOSS-ABNORMAL  (ICD-783.21) NAUSEA (ICD-787.02) ABDOMINAL PAIN-EPIGASTRIC (ICD-789.06) PANCREATITIS, HX OF (ICD-V12.70) EPIGASTRIC PAIN (ICD-789.06) EPIGASTRIC PAIN (ICD-789.06) FATIGUE (ICD-780.79) DEPRESSION (ICD-311) DIVERTICULOSIS, COLON (ICD-562.10) LOW BACK PAIN (ICD-724.2) GERD (ICD-530.81) ALLERGIC RHINITIS (ICD-477.9) OVERACTIVE BLADDER (ICD-596.51) hx of rhabdomyolysis episode may 2011/dehydration  Diverticulosis, colon         MD roster:  urology - [facility]/dr davis/[place]                           cards  -   Dr Johney Frame  Past Surgical History: Last updated: 06/05/2009 pacemaker 05/2009  Family History: Last updated: 06/05/2009 father with HTN, lung cancer No FH of Colon Cancer:  Social History: Last updated: 12/25/2009 retired - [employer] Former Smoker Alcohol use-no Married 3 children - 1 deceased with homicide Illicit Drug Use - no  Risk Factors: Smoking Status: quit (05/30/2008)  Review of  Systems  The patient denies anorexia, fever, vision loss, decreased hearing, hoarseness, chest pain, dyspnea on exertion, peripheral edema, prolonged cough, headaches, hemoptysis, abdominal pain, melena, hematochezia, severe indigestion/heartburn, hematuria, muscle weakness, suspicious skin lesions, transient blindness, difficulty walking, depression, unusual weight change, abnormal bleeding, enlarged lymph nodes, and angioedema.         all otherwise negative per pt -    Physical Exam  General:  alert and well-developed.  NAD Head:  Normocephalic and atraumatic without obvious abnormalities. No apparent alopecia or balding. Eyes:  vision grossly intact; pupils equal, round and reactive to light.  conjunctiva and lids normal.    Ears:  bilat tms' with mild erythema, canals clear Nose:  nasal dischargemucosal pallor and mucosal edema.   Mouth:  pharyngeal erythema and fair dentition.   Neck:  supple and no masses.   Lungs:  normal respiratory effort and normal breath  sounds.   Heart:  normal rate and regular rhythm.   Abdomen:  soft, non-tender, and normal bowel sounds.   Msk:  no joint tenderness and no joint swelling.  , has mild tender area to right paravertebral and occiput areas without erythema, swelling, rash or sores Extremities:  no edema, no erythema  Neurologic:  strength normal in all extremities, sensation intact to light touch, and gait normal.   Skin:  color normal and no rashes.   Psych:  not anxious appearing and not depressed appearing.     Impression & Recommendations:  Problem # 1:  SYNCOPE (ICD-780.2)  I suspect more possibly related to med (tamsulosin) or other CV etiology than neurological;  will check orthostatics today, ECG, recent lab work reviewed,  as well as order carotid u/s, and repeat echo (last done aug 2010);  may need f/u stress testing as well;  had recent device check - apparently no issues there;   ? need further stress testing  Orders: Cardiology Referral (Cardiology) Radiology Referral (Radiology) Echo Referral (Echo)  Problem # 2:  HYPERTENSION (ICD-401.9)  His updated medication list for this problem includes:    Lisinopril 20 Mg Tabs (Lisinopril) .Marland Kitchen... 1po once daily    Amlodipine Besylate 5 Mg Tabs (Amlodipine besylate) .Marland Kitchen... 1 by mouth once daily  BP today: 124/88 Prior BP: 122/82 (12/04/2010)  Labs Reviewed: K+: 4.4 (11/24/2010) Creat: : 0.9 (11/24/2010)   Chol: 243 (11/24/2010)   HDL: 104.80 (11/24/2010)   LDL: 88 (12/25/2009)   TG: 59.0 (11/24/2010) stable overall by hx and exam, ok to continue meds/tx as is   Problem # 3:  UNSPECIFIED EUSTACHIAN TUBE DISORDER (ICD-381.9) ok for mucinex OTC as needed , and to use the allergy meds daily basis instead of as needed, should avoid the valsalva maneuver he does occasionally to clear the ears given his other issues for now  Problem # 4:  NECK PAIN, RIGHT (ICD-723.1)  His updated medication list for this problem includes:    Aspirin 325 Mg Tabs  (Aspirin) ..... One by mouth qd    Tylenol With Codeine #3 300-30 Mg Tabs (Acetaminophen-codeine) .Marland Kitchen... 1 by mouth three times a day as needed ongoing recurrent, c/w kwnon cervical spondlyosis type pain, +/- occipital neuralgia - to incr the gabapentin to 600 three times a day;  has been referred already to neuro -(has appt april 2012)  Complete Medication List: 1)  Simvastatin 40 Mg Tabs (Simvastatin) .... 1/2 po once daily 2)  Levitra 20 Mg Tabs (Vardenafil hcl) .Marland Kitchen.. 1po once daily as needed 3)  Cetirizine Hcl 10 Mg Tabs (  Cetirizine hcl) .Marland Kitchen.. 1 by mouth once daily as needed allergies 4)  Aspirin 325 Mg Tabs (Aspirin) .... One by mouth qd 5)  Tylenol With Codeine #3 300-30 Mg Tabs (Acetaminophen-codeine) .Marland Kitchen.. 1 by mouth three times a day as needed 6)  Citalopram Hydrobromide 10 Mg Tabs (Citalopram hydrobromide) .Marland Kitchen.. 1po once daily 7)  Promethazine Hcl 25 Mg Tabs (Promethazine hcl) .Marland Kitchen.. 1 by mouth q 6 hrs prn nausea 8)  Lisinopril 20 Mg Tabs (Lisinopril) .Marland Kitchen.. 1po once daily 9)  Amlodipine Besylate 5 Mg Tabs (Amlodipine besylate) .Marland Kitchen.. 1 by mouth once daily 10)  Gabapentin 300 Mg Caps (Gabapentin) .... 2 po  three times a day 11)  Fluticasone Propionate 50 Mcg/act Susp (Fluticasone propionate) .... 2 spray/side once daily 12)  Omeprazole 20 Mg Cpdr (Omeprazole) .Marland Kitchen.. 1po once daily  Patient Instructions: 1)  You do not appear to have dehydration today 2)  Your EKG was ok today 3)  You will be contacted about the referral(s) to: carotid ultrasound, echocardiogram, cardiology (dr allred) 4)  increase the gabapentin to 300 mg  - 2 pills three times a day  5)  Continue all previous medications as before this visit 6)  You can also use Mucinex OTC or it's generic for congestion  7)  Please keep your appt with neurology as planned 8)  Please schedule a follow-up appointment in 6 mo or sooner if needed Prescriptions: GABAPENTIN 300 MG CAPS (GABAPENTIN) 2 po  three times a day  #180 x 3   Entered  and Authorized by:   Corwin Levins MD   Signed by:   Corwin Levins MD on 12/18/2010   Method used:   Print then Give to Patient   RxID:   [id number]    Orders Added: 1)  Cardiology Referral [Cardiology] 2)  Radiology Referral [Radiology] 3)  Echo Referral [Echo] 4)  Est. Patient Level V [08657]

## 2010-12-24 NOTE — Miscellaneous (Signed)
Summary: Orders Update  Clinical Lists Changes  Orders: Added new Service order of EKG w/ Interpretation (93000) - Signed 

## 2011-01-02 ENCOUNTER — Encounter: Payer: Self-pay | Admitting: Internal Medicine

## 2011-01-05 ENCOUNTER — Encounter (INDEPENDENT_AMBULATORY_CARE_PROVIDER_SITE_OTHER): Payer: Medicare Other

## 2011-01-05 ENCOUNTER — Encounter: Payer: Self-pay | Admitting: Internal Medicine

## 2011-01-05 ENCOUNTER — Ambulatory Visit (HOSPITAL_COMMUNITY): Payer: Medicare Other | Attending: Cardiovascular Disease

## 2011-01-05 DIAGNOSIS — I079 Rheumatic tricuspid valve disease, unspecified: Secondary | ICD-10-CM | POA: Insufficient documentation

## 2011-01-05 DIAGNOSIS — I519 Heart disease, unspecified: Secondary | ICD-10-CM | POA: Insufficient documentation

## 2011-01-05 DIAGNOSIS — I6529 Occlusion and stenosis of unspecified carotid artery: Secondary | ICD-10-CM

## 2011-01-05 DIAGNOSIS — R5381 Other malaise: Secondary | ICD-10-CM | POA: Insufficient documentation

## 2011-01-05 DIAGNOSIS — R5383 Other fatigue: Secondary | ICD-10-CM | POA: Insufficient documentation

## 2011-01-05 DIAGNOSIS — R0989 Other specified symptoms and signs involving the circulatory and respiratory systems: Secondary | ICD-10-CM

## 2011-01-05 DIAGNOSIS — R55 Syncope and collapse: Secondary | ICD-10-CM | POA: Insufficient documentation

## 2011-01-05 DIAGNOSIS — E785 Hyperlipidemia, unspecified: Secondary | ICD-10-CM | POA: Insufficient documentation

## 2011-01-07 NOTE — Letter (Signed)
Summary: [facility]   Imported By: Sherian Rein 12/29/2010 12:17:14  _____________________________________________________________________  External Attachment:    Type:   Image     Comment:   External Document

## 2011-01-07 NOTE — Miscellaneous (Signed)
Summary: Orders Update  Clinical Lists Changes  Orders: Added new Test order of Carotid Duplex (Carotid Duplex) - Signed 

## 2011-01-14 ENCOUNTER — Encounter: Payer: Self-pay | Admitting: Internal Medicine

## 2011-01-15 ENCOUNTER — Encounter: Payer: Self-pay | Admitting: Internal Medicine

## 2011-01-15 ENCOUNTER — Ambulatory Visit (INDEPENDENT_AMBULATORY_CARE_PROVIDER_SITE_OTHER): Payer: Medicare Other | Admitting: Internal Medicine

## 2011-01-15 DIAGNOSIS — I10 Essential (primary) hypertension: Secondary | ICD-10-CM

## 2011-01-15 DIAGNOSIS — R55 Syncope and collapse: Secondary | ICD-10-CM

## 2011-01-15 DIAGNOSIS — I495 Sick sinus syndrome: Secondary | ICD-10-CM

## 2011-01-21 ENCOUNTER — Encounter: Payer: Self-pay | Admitting: Internal Medicine

## 2011-01-22 NOTE — Assessment & Plan Note (Signed)
Summary: np6. syncope. per deborah from [facility]. notes in emr.gd      Allergies Added: NKDA  Visit Type:  Follow-up Referring Provider:  n/a Primary Provider:  Bayard [name]   History of Present Illness: The patient presents today for electrophysiology followup.  He states that in mid January, he had a syncopal episode.  He was sitting in his den when the doorbell rang.  He reports getting up quickly to answer the doorbell.  Within several minutes upon standing, he became briefly dizzy and then collapsed with LOC.  He feels that he was likely unconcious for only several seconds.  He denies assocaited CP, SOB, palpitations, other other symptoms He was evaluated initially by Dr Felicity Coyer and subsequently by Dr Jonny Ruiz.  He was felt to have orthostatic syncope, possibly due to his alpha blocker.  His alpha blocker was discontinued and he has done well since.  He denies any further dizziness, presyncope, or syncope.  He appears to be doing quite well otherwise.  He denies exertional SOB, chest pain, or ischemic sypmtoms.  His recent echo reveals preseved EF.  Current Medications (verified): 1)  Simvastatin 40 Mg Tabs (Simvastatin) .... 1/2 Po Once Daily 2)  Levitra 20 Mg Tabs (Vardenafil Hcl) .Marland Kitchen.. 1po Once Daily As Needed 3)  Cetirizine Hcl 10 Mg  Tabs (Cetirizine Hcl) .Marland Kitchen.. 1 By Mouth Once Daily As Needed Allergies 4)  Aspirin 325 Mg Tabs (Aspirin) .... One By Mouth Qd 5)  Tylenol With Codeine #3 300-30 Mg Tabs (Acetaminophen-Codeine) .Marland Kitchen.. 1 By Mouth Three Times A Day As Needed 6)  Citalopram Hydrobromide 10 Mg Tabs (Citalopram Hydrobromide) .Marland Kitchen.. 1po Once Daily 7)  Promethazine Hcl 25 Mg Tabs (Promethazine Hcl) .Marland Kitchen.. 1 By Mouth Q 6 Hrs Prn Nausea 8)  Lisinopril 20 Mg Tabs (Lisinopril) .Marland Kitchen.. 1po Once Daily 9)  Amlodipine Besylate 5 Mg Tabs (Amlodipine Besylate) .Marland Kitchen.. 1 By Mouth Once Daily 10)  Gabapentin 300 Mg Caps (Gabapentin) .... 2 Po  Three Times A Day 11)  Fluticasone Propionate 50 Mcg/act  Susp (Fluticasone Propionate) .... 2 Spray/side Once Daily 12)  Omeprazole 20 Mg Cpdr (Omeprazole) .Marland Kitchen.. 1po Once Daily  Allergies (verified): No Known Drug Allergies  Past History:  Past Surgical History: Reviewed history from 06/05/2009 and no changes required. pacemaker 05/2009  Social History: Reviewed history from 12/25/2009 and no changes required. retired - [employer] Former Smoker Alcohol use-no Married 3 children - 1 deceased with homicide Illicit Drug Use - no  Review of Systems       All systems are reviewed and negative except as listed in the HPI.   Vital Signs:  Patient profile:   70 year old male Height:      69 inches Weight:      204 pounds BMI:     30.23 Pulse rate:   59 / minute BP sitting:   110 / 80  (left arm)  Vitals Entered By: Laurance Flatten CMA (January 15, 2011 8:56 AM)  Physical Exam  General:  Well developed, well nourished, in no acute distress. Head:  normocephalic and atraumatic Eyes:  PERRLA/EOM intact; conjunctiva and lids normal. Mouth:  Teeth, gums and palate normal. Oral mucosa normal. Neck:  supple Chest Wall:  pacemaker site is well healed Lungs:  Clear bilaterally to auscultation and percussion. Heart:  RRR, no m/r/g Abdomen:  Bowel sounds positive; abdomen soft and non-tender without masses, organomegaly, or hernias noted. No hepatosplenomegaly. Msk:  Back normal, normal gait. Muscle strength and tone normal.  Extremities:  No clubbing or cyanosis. Neurologic:  Alert and oriented x 3. Skin:  Intact without lesions or rashes. Psych:  Normal affect.  Echocardiogram  Procedure date:  01/05/2011  Findings:       Study Conclusions   Left ventricle: The cavity size was normal. Wall thickness was   normal. Systolic function was normal. The estimated ejection   fraction was in the range of 50% to 55%. Doppler parameters are   consistent with abnormal left ventricular relaxation (grade 1   diastolic dysfunction).    Transthoracic  echocardiography. M-mode,   complete 2D, spectral Doppler, and color Doppler. Height: Height:   175.3cm. Height: 69in. Weight: Weight: 89.8kg. Weight: 197.6lb. Body   mass index: BMI: 29.2kg/m^2. Body surface area: BSA: 2.74m^2. Blood   pressure: 130/86. Patient status: Outpatient. Location: [place]   Site 3  Left atrium                        S   AP dim           31 mm     ------  VTI, S      15.6 cm        ------   AP dim index    1.5 cm/m^2 <2.2    HR            59 bpm       ------           Prepared and Electronically Authenticated by    Dietrich Pates, MD   2012-02-20T17:30:[id number]     PPM Specifications Following MD:  Hillis Range, MD     PPM Vendor:  [facility] [employer]     PPM Serial Number:  [id number] PPM DOI:  06/03/2009     PPM Implanting MD:  Hillis Range, MD Research Study Name Sinus node dysfunction/[employer]  Lead 1    Location: [facility]     DOI: 06/03/2009     Model #: [id number]     Serial #: [id number]     Status: active Lead 2    Location: [facility]     DOI: 06/03/2009     Model #: [id number]     Serial #: [id number]     Status: active  Magnet Response Rate:  BOL 100 ERI 85  Indications:  Sick sinus syndrome   PPM Follow Up Pacer Dependent:  No      Episodes Coumadin:  No  Parameters Mode:  DDDR     Lower Rate Limit:  60     Upper Rate Limit:  150 Paced AV Delay:  400     Sensed AV Delay:  100 MD Comments:  see scanned report from [facility]  Impression & Recommendations:  Problem # 1:  SYNCOPE (ICD-780.2) By history, the patient's syncopal episode is consistant with orthostatic syncope, likely exacerbated by his alpha blocker.  I would agree that we should avoid alpha blockers in the future if able.  I have reviewed his pacemaker interrogation today which reveals no arrhythmias to explain his syncope and his pacemaker function is normal today. His echo reveals normal LV function and he has no symptoms of ischemia.  I would therefore not recommend futher CV testing at this  time.  Problem # 2:  BRADYCARDIA (ICD-427.89) normal pacemaker function as above  Problem # 3:  HYPERTENSION (ICD-401.9) stable no changes  Problem # 4:  HYPERLIPIDEMIA (ICD-272.4) stable His updated medication list for this problem includes:  Simvastatin 40 Mg Tabs (Simvastatin) .Marland Kitchen... 1/2 po once daily  Patient Instructions: 1)  Your physician wants you to follow-up in: 6 months with device clinic  You will receive a reminder letter in the mail two months in advance. If you don't receive a letter, please call our office to schedule the follow-up appointment. 2)  Your physician has recommended you make the following change in your medication: increase Omeprazole to two times a day for two weeks if symptoms not better follow up with Dr Jonny Ruiz  Appended Document: np6. syncope. per deborah from [facility]. notes in emr.gd Pt also reports indigestion/ abdominal fullness with eating.  Denies exertional CP.  I have increased PPI to twice daily today.  He will follow-up with Dr Jonny Ruiz if not improved.

## 2011-01-27 NOTE — Cardiovascular Report (Signed)
Summary: Office Visit   Office Visit   Imported By: Roderic Ovens 01/21/2011 12:45:47  _____________________________________________________________________  External Attachment:    Type:   Image     Comment:   External Document

## 2011-02-03 LAB — CBC
HCT: 47.6 % (ref 39.0–52.0)
Hemoglobin: 14.2 g/dL (ref 13.0–17.0)
Hemoglobin: 15.6 g/dL (ref 13.0–17.0)
MCHC: 32.8 g/dL (ref 30.0–36.0)
MCHC: 34.3 g/dL (ref 30.0–36.0)
RBC: 4.55 MIL/uL (ref 4.22–5.81)
RBC: 5.22 MIL/uL (ref 4.22–5.81)
RDW: 13.6 % (ref 11.5–15.5)

## 2011-02-03 LAB — URINE CULTURE: Culture: NO GROWTH

## 2011-02-03 LAB — COMPREHENSIVE METABOLIC PANEL
ALT: 24 U/L (ref 0–53)
ALT: 25 U/L (ref 0–53)
Alkaline Phosphatase: 81 U/L (ref 39–117)
BUN: 12 mg/dL (ref 6–23)
BUN: 16 mg/dL (ref 6–23)
CO2: 24 mEq/L (ref 19–32)
Calcium: 9 mg/dL (ref 8.4–10.5)
GFR calc non Af Amer: >60 mL/min (ref >60)
Glucose, Bld: 107 mg/dL — ABNORMAL HIGH (ref 70–99)
Glucose, Bld: 95 mg/dL (ref 70–99)
Potassium: 3.6 mEq/L (ref 3.5–5.1)
Sodium: 136 mEq/L (ref 135–145)
Total Protein: 6.4 g/dL (ref 6.0–8.3)
Total Protein: 8.1 g/dL (ref 6.0–8.3)

## 2011-02-03 LAB — DIFFERENTIAL
Basophils Absolute: 0 10*3/uL (ref 0.0–0.1)
Basophils Relative: 0 % (ref 0–1)
Eosinophils Absolute: 0 10*3/uL (ref 0.0–0.7)
Monocytes Relative: 10 % (ref 3–12)
Neutro Abs: 7.3 10*3/uL (ref 1.7–7.7)
Neutrophils Relative %: 73 % (ref 43–77)

## 2011-02-03 LAB — POCT CARDIAC MARKERS
CKMB, poc: 2.4 ng/mL (ref 1.0–8.0)
Myoglobin, poc: 210 ng/mL (ref 12–200)
Troponin i, poc: <0.05 ng/mL (ref 0.00–0.09)

## 2011-02-03 LAB — URINALYSIS, ROUTINE W REFLEX MICROSCOPIC
Glucose, UA: NEGATIVE mg/dL
Ketones, ur: 15 mg/dL — AB
Nitrite: NEGATIVE
Protein, ur: NEGATIVE mg/dL
Urobilinogen, UA: 1 mg/dL (ref 0.0–1.0)

## 2011-02-03 LAB — TROPONIN I: Troponin I: 0.01 ng/mL (ref 0.00–0.06)

## 2011-02-03 LAB — CK TOTAL AND CKMB (NOT AT ARMC)
CK, MB: 6.1 ng/mL (ref 0.3–4.0)
Relative Index: 1.4 (ref 0.0–2.5)
Total CK: 442 U/L — ABNORMAL HIGH (ref 7–232)

## 2011-02-03 LAB — LIPASE, BLOOD: Lipase: 83 U/L (ref 23–300)

## 2011-02-03 NOTE — Letter (Signed)
[facility]: [facility]   Imported By: Sherian Rein 01/27/2011 14:36:52  _____________________________________________________________________  External Attachment:    Type:   Image     Comment:   External Document

## 2011-02-22 LAB — COMPREHENSIVE METABOLIC PANEL
Alkaline Phosphatase: 82 U/L (ref 39–117)
BUN: 16 mg/dL (ref 6–23)
Chloride: 106 mEq/L (ref 96–112)
Glucose, Bld: 84 mg/dL (ref 70–99)
Potassium: 4.1 mEq/L (ref 3.5–5.1)
Total Bilirubin: 0.5 mg/dL (ref 0.3–1.2)

## 2011-02-22 LAB — BASIC METABOLIC PANEL
BUN: 10 mg/dL (ref 6–23)
CO2: 24 mEq/L (ref 19–32)
CO2: 26 mEq/L (ref 19–32)
Calcium: 8.8 mg/dL (ref 8.4–10.5)
Calcium: 9.2 mg/dL (ref 8.4–10.5)
Chloride: 106 mEq/L (ref 96–112)
Chloride: 107 mEq/L (ref 96–112)
Creatinine, Ser: 0.7 mg/dL (ref 0.4–1.5)
Creatinine, Ser: 0.71 mg/dL (ref 0.4–1.5)
Creatinine, Ser: 0.76 mg/dL (ref 0.4–1.5)
GFR calc Af Amer: >60 mL/min (ref >60)
GFR calc Af Amer: >60 mL/min (ref >60)
GFR calc non Af Amer: >60 mL/min (ref >60)
GFR calc non Af Amer: >60 mL/min (ref >60)
Glucose, Bld: 83 mg/dL (ref 70–99)
Potassium: 3.1 mEq/L — ABNORMAL LOW (ref 3.5–5.1)

## 2011-02-22 LAB — URINALYSIS, ROUTINE W REFLEX MICROSCOPIC
Glucose, UA: NEGATIVE mg/dL
Hgb urine dipstick: NEGATIVE
Protein, ur: NEGATIVE mg/dL

## 2011-02-22 LAB — CARDIAC PANEL(CRET KIN+CKTOT+MB+TROPI)
CK, MB: 2.7 ng/mL (ref 0.3–4.0)
Relative Index: 1.6 (ref 0.0–2.5)
Total CK: 166 U/L (ref 7–232)

## 2011-02-22 LAB — DIFFERENTIAL
Basophils Absolute: 0.1 10*3/uL (ref 0.0–0.1)
Basophils Relative: 1 % (ref 0–1)
Neutro Abs: 3.4 10*3/uL (ref 1.7–7.7)
Neutrophils Relative %: 51 % (ref 43–77)

## 2011-02-22 LAB — POCT CARDIAC MARKERS: Myoglobin, poc: 60.3 ng/mL (ref 12–200)

## 2011-02-22 LAB — MAGNESIUM: Magnesium: 1.7 mg/dL (ref 1.5–2.5)

## 2011-02-22 LAB — TSH: TSH: 0.952 u[IU]/mL (ref 0.350–4.500)

## 2011-02-22 LAB — CBC
HCT: 44.2 % (ref 39.0–52.0)
Hemoglobin: 14.6 g/dL (ref 13.0–17.0)
WBC: 6.6 10*3/uL (ref 4.0–10.5)

## 2011-02-22 LAB — PROTIME-INR: Prothrombin Time: 16.7 seconds — ABNORMAL HIGH (ref 11.6–15.2)

## 2011-03-05 ENCOUNTER — Other Ambulatory Visit: Payer: Self-pay

## 2011-03-05 MED ORDER — OMEPRAZOLE MAGNESIUM 20 MG PO TBEC: 20.0000 mg | DELAYED_RELEASE_TABLET | Freq: Two times a day (BID) | ORAL | Status: DC

## 2011-03-05 NOTE — Telephone Encounter (Signed)
Pt called stating Dr Johney Frame increased omeprazole to bid, therefore new Rx is needed.

## 2011-03-12 ENCOUNTER — Other Ambulatory Visit: Payer: Self-pay

## 2011-03-12 MED ORDER — ACETAMINOPHEN-CODEINE #3 300-30 MG PO TABS: 1.0000 | ORAL_TABLET | ORAL | Status: DC | PRN

## 2011-03-12 NOTE — Telephone Encounter (Signed)
Faxed hardcopy to pharmacy. 

## 2011-03-12 NOTE — Telephone Encounter (Signed)
[facility] [address] Requesting refill on Acetaminophen - cod #3 last refill 12/02/10 #90 and 0 refills and last OV 01/05/2011.

## 2011-03-18 ENCOUNTER — Telehealth: Payer: Self-pay

## 2011-03-18 NOTE — Telephone Encounter (Signed)
Pt called stating he was bitten by a tick a few days ago but he was able to remove the tick and currently does not have any sxs. Pt is requesting MD advised of any sxs specific to tick bites that he should be mindful of. Please advise

## 2011-03-18 NOTE — Telephone Encounter (Signed)
Ok to be aware of fever, body rash, or any red/swelling at the site

## 2011-03-19 NOTE — Telephone Encounter (Signed)
Pt advised.

## 2011-03-19 NOTE — Telephone Encounter (Signed)
Left message on machine for pt to return my call  

## 2011-03-31 NOTE — H&P (Signed)
[name], [name]                   ACCOUNT NO.:  [id number]   MEDICAL RECORD NO.:  [id number]          PATIENT TYPE:  INP   LOCATION:  3731                         FACILITY:  [facility]   PHYSICIAN:  Glennie Isle, MD   DATE OF BIRTH:  13-Apr-1941   DATE OF ADMISSION:  06/01/2009  DATE OF DISCHARGE:                              HISTORY & PHYSICAL   CHIEF COMPLAINT:  Presyncope/bradycardia.   HISTORY OF PRESENT ILLNESS:  The patient is a 70 year old with a history  of asymptomatic bradycardia (noted July 2005 in hospital records which  stated heart rate 130s) who clinically presents with complaint of  fatigue, shortness of breath, intermittent lightheaded episodes for the  past few weeks.  The patient is extremely active and works in his yard 3-  4 times a week for several hours at a time.  He denies any chest pain.  He was scheduled to have a tress test later this month by A PCP.  He  said he was complaining of these symptoms of fatigue and intermittent  lightheadedness.  The patient was an avid runner in the past and is  extremely active without any chest pain.   As a side note, the patient has also been complaining of severe GERD-  like symptoms over the past few weeks.  He had had these symptoms in the  past in 2005 when he was scoped and found to have severe gastritis.  At  that time, the patient had anorexia and the patient currently complains  of 7-pound weight loss.  The patient feels like he deserved similar to  his prior gastritis.  The patient continues to take NSAIDS nearly daily  for his arthritis.  [place] was found  to have a heart rate of 39-46, blood pressures of 147-157/79-84.   PAST MEDICAL HISTORY:  1. Hypertension.  2. Hyperlipidemia.  3. Osteoarthritis.  4. History of abdominal pain/gastroenteritis, and EGD was shown to      have H.pylori and gastritis.  5. History of asymptomatic bradycardia per report from the electronic    medical record.  The patient had competing junctional pacemaker in      July 2005 with heart rate in the 30s when sleeping.  EF of 2005 was      50-55% with no wall motion abnormalities.   MEDICATIONS:  1. Caduet 5/20 mg.  2. Cialis 20 mg daily p.r.n.  3. Diclofenac 75 mg b.i.d. p.r.n.  4. Protonix 40 mg daily.  5. Vesicare 10 mg daily.  6. Cetirizine 10 mg daily.  7. Aspirin 81 mg daily.   SOCIAL HISTORY:  Retired, quit tobacco 25 years ago.  Denies any alcohol  use.   FAMILY HISTORY:  No family history for CAD.   REVIEW OF SYSTEMS:  The patient endorses shortness of breath, presyncope  and GERD-like symptoms.  Otherwise, a 12-point review of systems was  performed and was negative except as noted above.   PHYSICAL EXAMINATION:  VITAL SIGNS:  Currently pending.  GENERAL:  No apparent distress.  HEENT:  Pupils equal, round and reactive to light.  Extraocular  movements intact.  Oropharynx clear.  Mucous membranes moist.  NECK:  Supple.  There is no JVD or bruits heard.  CARDIOVASCULAR:  S1, S2 are normal.  The patient is bradycardic.  He has  a systolic ejection murmur 1/6 at the apex.  LUNGS:  Clear to auscultation bilaterally.  SKIN:  No rashes are noted.  ABDOMEN:  Soft, nontender.  No rebound or guarding is noted.  EXTREMITIES:  No cyanosis, clubbing or edema is noted.  NEUROLOGICAL:  Alert and oriented x3.  Cranial nerves II-XII intact.  Strength 5/5 upper and lower extremities bilaterally.  Normal sensation  throughout.   RADIOLOGY:  At the hospital, showed a negative SVT.  Chest x-ray also at  the hospital was negative per report.   EKG:  Initial EKG at the other hospital showed a sinus bradycardia and  what appears to be a short PR interval, though no delta seen.  Target  was 39.  His EKG here shows a heart rate of 35 and his P-R interval is  about 130 milliseconds here.  He does have some U-waves noted.   LABORATORY DATA:  Hemoglobin 14.6, platelets 184, BUN  and creatinine of  16/0.8, calcium 9.2.  LFTs are within normal limits.  Lipase 100.  First  set of cardiac markers are negative.  UA was negative.   ASSESSMENT/PLAN:  1. Asymptomatic bradycardia.  There is little suspicion apparently for      obstructive coronary artery disease.  The patient's symptoms are      consistent with symptoms bradycardia.  We will get an EP consult      for pacemaker placement in the morning.  We will keep atropine at      the bedside and continue telemetry.  We will avoid all AV nodal      agents at the current time.  We will continue to rule out for      cardiac markers.  Will consider an EKG, treadmill test to evaluate      for chronotropic incompetence in the morning or on Monday if      possible.  2. Gastritis/weight loss.  Will get a GI consult and continue PPI.      Discontinue nonsteroidals.  3. Hypertension.  Hold antihypertensives for now.  4. DVT.  Will use SCDs to hold Lovenox.  The patient has a high      likelihood of having severe gastritis/ulcers.      Glennie Isle, MD  Electronically Signed     SS/MEDQ  D:  06/02/2009  T:  06/02/2009  Job:  [id number]

## 2011-03-31 NOTE — Consult Note (Signed)
[name], [name]                   ACCOUNT NO.:  [id number]   MEDICAL RECORD NO.:  [id number]          PATIENT TYPE:  INP   LOCATION:  3731                         FACILITY:  [facility]   PHYSICIAN:  Hillis Range, MD       DATE OF BIRTH:  May 11, 1941   DATE OF CONSULTATION:  06/03/2009  DATE OF DISCHARGE:                                 CONSULTATION   REASON FOR CONSULTATION:  Bradycardia.   REQUESTING PHYSICIAN:  Rollene Rotunda, MD, [facility]   HISTORY OF PRESENT ILLNESS:  [name] is a pleasant 70 year old  gentleman with a history of hypertension, hyperlipidemia, and  bradycardia who was admitted with symptoms of shortness of breath and  fatigue.  The patient notes that over the past few months, he has had  progressive symptoms of fatigue and shortness of breath.  He notes that  he feels very washed out after doing yard work.  He denies episodes of  chest pain.  He notes several weeks ago while riding in his car that he  became briefly dizzy with blurred vision.  He denies significant  palpitations.  He denies presyncope or syncope.  The patient has not had  symptoms of orthopnea, PND, lower extremity edema, or heart failure.  He  has a longstanding history of bradycardia for which he has been taken  off any chronotropic agents, but remains bradycardiac.  He is admitted  for further evaluation.  Yesterday, he was observed on telemetry to have  heart rates predominantly in the 30s and low 40s.  He was ambulated in  the hall briskly by Dr. Antoine Poche without significant increasing heart  rate with activities.  He is otherwise without complaint at this time.   PAST MEDICAL HISTORY:  1. Hypertension.  2. Hyperlipidemia.  3. Degenerative joint disease.  4. History of gastritis.   Transthoracic echocardiogram in 2005, reveals an ejection fraction of 50-  55% with no wall motion abnormalities.   MEDICATIONS:  1. Caduet 5/20 mg daily.  2. Cialis p.r.n.  3. Diclofenac 75 mg b.i.d.  4.  Protonix 40 mg daily.  5. VESIcare 10 mg daily.  6. Cetirizine 10 mg daily.  7. Aspirin 81 mg daily.   ALLERGIES:  No known drug allergies.   SOCIAL HISTORY:  The patient lives in [place] with his spouse.  He is retired, but continues work mowing the lawns.  He has a history of  tobacco, but quit 25 years ago.  He denies alcohol or drug use.   FAMILY HISTORY:  No family history of arrhythmias or coronary artery  disease.   REVIEW OF SYSTEMS:  All systems are reviewed and negative, except as  outlined in the HPI above.   PHYSICAL EXAMINATION:  VITALS:  Blood pressure 128/77; heart rate 41;  respirations 16; and sats 100% on room air, afebrile.  GENERAL:  The patient is a thin, well-appearing male, in no acute  distress.  He is alert and oriented x3.  HEENT:  Normocephalic and atraumatic.  Sclerae clear.  Conjunctivae  pink.  Oropharynx clear.  NECK:  Supple.  No thyromegaly, JVD, or bruits.  LUNGS:  Clear to auscultation bilaterally.  HEART:  Telemetry reveals sinus bradycardia with heart rates  predominantly in the 30s and 40s.  Bradycardic and regular rhythm.  No  murmurs, rubs, or gallops.  GI:  Soft, nontender, and nondistended.  Positive bowel sounds.  EXTREMITIES:  No clubbing, cyanosis, or edema.  NEUROLOGIC:  Cranial nerves II through XII are intact.  Strength and  sensation are intact.  SKIN:  No ecchymoses or lacerations.  MUSCULOSKELETAL:  No deformity or atrophy.  PSYCH:  Euthymic mood.  Full affect.   EKG reveals sinus bradycardia with heart rate of 35 beats per minute  with a PR duration of 136 msec and a corrected QT interval of 420 msec.  There is no evidence of ischemia.   Chest x-ray from June 01, 2009, reveals no acute airspace disease.   LABORATORIES:  TSH is 0.952, creatinine 0.7, and INR 1.3.  Cardiac  markers are negative.  White blood cell count 6.6, hematocrit 44, and  platelets 182.   IMPRESSION:  [name] is a pleasant 70 year old  gentleman who is  admitted with symptoms of fatigue, shortness of breath, and dizziness in  the setting of heart rates in the 30s and 40s.  Upon ambulation of the  patient, he did not have an adequate heart rate acceleration with  activities.  He is therefore diagnosed with chronotropic incompetence.  He has no evidence of reversible causes for his chronotropic  incompetence at this time.   PLAN:  Therapeutic strategies for chronotropic incompetence were  discussed in detail with the patient today.  Risks, benefits, and  alternatives to pacemaker implantation including infection, bleeding,  pneumothorax, tamponade perforation, vascular damage, renal failure, MI  and death.  The patient understands these risks and wishes to proceed.  We will therefore schedule the patient for dual-chamber pacemaker  implantation at the next available time.      Hillis Range, MD  Electronically Signed     JA/MEDQ  D:  06/03/2009  T:  06/03/2009  Job:  [id number]   cc:   Rollene Rotunda, MD, [facility]

## 2011-03-31 NOTE — Discharge Summary (Signed)
[name], [name]                   ACCOUNT NO.:  [id number]   MEDICAL RECORD NO.:  [id number]          PATIENT TYPE:  INP   LOCATION:  3731                         FACILITY:  [facility]   PHYSICIAN:  Hillis Range, MD       DATE OF BIRTH:  09-30-1941   DATE OF ADMISSION:  06/01/2009  DATE OF DISCHARGE:  06/04/2009                               DISCHARGE SUMMARY   This patient has no known drug allergies.   Time for this dictation, examination, and appointment set up greater  than 40 minutes.   FINAL DIAGNOSES:  1. 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   SECONDARY DIAGNOSES:  1. Dyslipidemia.  2. Osteoarthritis.  3. Hypertension.   BRIEF HISTORY:  [name] is a 70 year old male.  He has a history of  bradycardia.  This was noted in July 2005 on [name] records.  He  presents with fatigue, shortness of breath, and intermittent lightheaded  episodes for the past few weeks.  Apparently, the patient is extremely  active.  He works in his yard 3-4 times a week for several hours at a  time.  He is scheduled to have an echocardiogram later this month by his  primary caregiver.  He has come in actually with symptoms of nausea,  vomiting, and abdominal discomfort presenting to his primary caregiver.  He was noted to have bradycardia.  He presents to [name]  for possible pacemaker.   Of note, the patient is also complaining of severe GERD-like symptoms  for the past few weeks.  He says these symptoms are similar to those he  had in 2005.  He had an EGD and was found to have a severe gastritis  secondary H. pylori.  He has completed treatment at that time.  He was  also anorexic.  The patient currently complains of a 7-pound weight  loss.  The patient continues to take NSAIDs nearly daily for arthritis.   We will plan early followup at [name] for consult.  In  the meantime, we have asked him to discontinue taking  diclofenac but to  continue taking Protonix daily.   PROCEDURE:  June 04, 2009, implant of the [name] scientific dual-chamber  pacemaker for symptomatic bradycardia and sinus node dysfunction by Dr.  Hillis Range on June 03, 2009.  The patient has had no postprocedural  complications.  The leads are in appropriate position.  He has no  hematoma.  The device has been interrogated.  All values within normal  limits.  He has been set up for research study with [name].  The patient is not complaining of any undue pain at the incision site.  He is asked not to drive for 1 week and to keep his incision dry for the  next 7 days and sponge bathing until Monday, June 10, 2009.   MEDICATIONS:  He is to stop diclofenac for arthritis.  He can take  Tylenol as directed.  He is also to avoid Naprosyn, ibuprofen, and  Advil.  He is resume the following medications.  1. Caduet 5/20 one tablet daily.  2. Doxycycline 100 mg twice daily.  This was newly started for tick      bite.  3. Protonix 40 mg daily.  4. VESIcare 10 mg daily.  5. Cetirizine or Zyrtec 10 mg daily.  6. Enteric-coated aspirin 81 mg daily.  7. Cialis 20 mg as needed.   1. He follows up with [name] 11/26, [place],      at [name] and echocardiogram on Wednesday, June 19, 2009, at      2 o'clock.  Results of the echocardiogram are of interest to Dr.      Oliver [name].  2. To see Dr. Johney [name] on Friday, September 06, 2009, at 9:10 in the      morning.  He follows up with [name], Dr. Verlee Monte      [name]'s office, the time of this examination is pending during the      time of this dictation.   Lab studies pertinent to this admission on June 03, 2009, basic  metabolic panel, sodium 139, potassium 3.8, chloride 104, carbonate 26,  BUN is 10, creatinine 0.70, glucose 83.  TSH this admission is 0.952.  Protime 16.7, INR 1.3, magnesium 1.7.  Cardiac panels, troponin I study  is 0.01.   Lipase is 100.  Alkaline phosphatase this admission is 82.  SGOT is 30, SGPT 27.  Complete blood count this admission:  White cells  6.6, hemoglobin 14.6, hematocrit 44.2, and platelets of 184.      [name], [place]      Hillis Range, MD  Electronically Signed    GM/MEDQ  D:  06/04/2009  T:  06/05/2009  Job:  [id number]   cc:   [name] Levins, MD  [name] Morton. Juanda Chance, MD

## 2011-03-31 NOTE — Op Note (Signed)
NAMEDILRAJ, KILLGORE                   ACCOUNT NO.:  [id number]   MEDICAL RECORD NO.:  [id number]          PATIENT TYPE:  INP   LOCATION:  3731                         FACILITY:  [facility]   PHYSICIAN:  Hillis Range, MD       DATE OF BIRTH:  08-12-1941   DATE OF PROCEDURE:  06/03/2009  DATE OF DISCHARGE:                               OPERATIVE REPORT   PREPROCEDURE DIAGNOSES:  1. Sinus node dysfunction.  2. Chronotropic incompetence.   POSTPROCEDURE DIAGNOSES:  1. Sinus node dysfunction.  2. Chronotropic incompetence.   PROCEDURES:  1. Dual-chamber pacemaker implantation.  2. Left upper extremity venography.   INTRODUCTION:  Mr. Bergquist is a very pleasant 70 year old gentleman with a  history of hypertension and hyperlipidemia who was admitted with  symptoms of fatigue, shortness of breath, and dizziness.  The patient  was found to have heart rates in the 30s and 40s on telemetry.  With  ambulation briskly, he had only mild elevation in heart rate to 57 beats  per minute with reproduction of his symptoms.  He therefore presents for  dual-chamber pacemaker implantation.   DESCRIPTION OF PROCEDURE:  Informed and written consent was obtained and  the patient was brought to the electrophysiology lab in a fasting state.  He was adequately sedated with intravenous Versed and fentanyl as  outlined in the nursing report.  The patient's left chest was prepped  and draped in the usual sterile fashion by the EP lab staff.  The skin  overlying the left deltopectoral region was infiltrated with lidocaine  for local analgesia.  A 5-cm incision was made over the left  deltopectoral region.  A left subcutaneous pacemaker pocket was  fashioned using a combination of sharp and blunt dissection.  Electrocautery was used to assure hemostasis.  The left cephalic vein  was visualized and was noted to be too small to accommodate lead  placement.  A venogram of the left upper extremity was therefore  performed, which revealed a patent left axillary vein, which appeared to  split into 2 vessels, which coursed in a superior and inferior course  and combined just past the first rib to form the subclavian vein.  A  prominent distal valve was noted with the venogram.  The left axillary  vein was cannulated and through this vein, a Guidant Dextrose model 4136  - 53 (serial number [id number]) right atrial lead and a Guidant Dextrose  model 4137 - 60 (serial number [id number]) right ventricular lead were  advanced with fluoroscopic visualization into the right atrial appendage  and right ventricular apex positions respectively.  Initial lead  measurements revealed an atrial lead P-wave of 4.5 mV with an impedance  of 598 ohms and a threshold of 0.7 volts at 0.5 milliseconds.  The right  ventricular lead R-wave measured 7.2 mV with an impedance of 686 ohms  and a threshold of 0.5 volts at 0.5 milliseconds.  The leads were then  secured to the pectoralis fascia using #2 silk suture over the suture  sleeves.  The leads were then connected to a Nenzel  scientific ALTRUA 60  (serial number [id number]) dual-chamber pacemaker.  The pacemaker was then  placed into the previously fashioned subcutaneous pacemaker pocket.  The  pocket was then irrigated with copious gentamicin solution.  Electrocautery was again used to assure hemostasis.  The pocket was then  closed in 2 layers with 2.0 Vicryl suture for the subcutaneous and  subcuticular layers.  Steri-Strips and a sterile dressing were then  applied.  There were no early apparent complications.   CONCLUSIONS:  1. Chronotropic incompetence.  2. Successful dual-chamber pacemaker implantation as described above.  3. No early apparent complications.      Hillis Range, MD  Electronically Signed     JA/MEDQ  D:  06/03/2009  T:  06/04/2009  Job:  [id number]   cc:   Corwin Levins, MD  Katherine Roan, MD  Rollene Rotunda, MD, [facility]

## 2011-04-03 NOTE — Discharge Summary (Signed)
NAME:  [name], [name]                             ACCOUNT NO.:  [id number]   MEDICAL RECORD NO.:  [id number]                   PATIENT TYPE:  INP   LOCATION:  0363                                 FACILITY:  [facility]   PHYSICIAN:  Barbette Hair. Arlyce [name], M.D. [facility]          DATE OF BIRTH:  1941-03-27   DATE OF ADMISSION:  05/26/2004  DATE OF DISCHARGE:  05/30/2004                                 DISCHARGE SUMMARY   ADMISSION DIAGNOSES:  40. A 70 year old white male with a two-week history of abdominal pain,     progressive, with nausea, vomiting, and diarrhea x24 hours, rule out     possible diverticulitis, gastroenteritis, or peptic ulcer disease.  2. Pink emesis.  Unclear whether or not this represented hematemesis.     Rule out nonsteroidal-induced peptic ulcer disease.  3. Remote history of pancreatitis.  No current evidence.  4. Hypertension.  5. Hyperlipidemia.  6. Osteoarthritis.   DISCHARGE DIAGNOSES:  1. Resolving acute infectious gastroenteritis.  2. Acute gastritis, Helicobacter pylori positive.  3. Asymptomatic bradycardia with normal ejection fraction.   CONSULTATIONS:  [name], internal medicine.   PROCEDURES:  1. Upper endoscopy.  2. A 2D echo.   BRIEF HISTORY:  [name] is a 70 year old African-American male with a  history as described above.  He is a primary patient of [name] and  known remotely to [name].  He does have a remote history of ETOH  use; however, none over the past 15 years and had an alcohol-induced  pancreatitis in 1986.  He was last seen by [name] in 1999 when he had a  colonoscopy showing left colon diverticulosis.  At this time, he presents to  the emergency room with complaints of two weeks of abdominal discomfort in  the mid to lower abdomen, which has become progressive and is associated  with nausea, vomiting, and diarrhea over the past 24 hours.  Apparently, he  feels that the pain was similar to his previous pancreatitis  pain, although  he describes it as a pressure and cramping type of pain.  He has had no  associated melena or hematochezia.  No fevers or chills.  No dysuria,  urgency, hematuria, etc.  He did vomit once in the emergency room, which was  pink-tinged but not hemocculted.  He is admitted through the emergency  room for pain control with further diagnostic evaluation with CT scan of the  abdomen and pelvis and possibly an upper endoscopy.   LABORATORY STUDIES:  On admission, a WBC of 10.9, hemoglobin 15.4,  hematocrit 46.6, MCV 89, platelets 218.  Followup on July 13, WBC of 11,  hemoglobin 16, hematocrit 47, platelets 216.  Hemoccults negative x2.  Sed  rate of 2.  Electrolytes on admission within normal limits.  Glucose 141,  BUN 10, creatinine 0.8, albumin 4.2.  Follow-up glucose on July 12 was 136.  These  were not fasting.  Liver function studies on admission:  Total  bilirubin 0.9, alk phos 66, ALT 45, AST 43, lipase 32, TSH 0.97.  Urinalysis  negative.  Urine culture negative.   X-RAY STUDIES:  A 2D echocardiogram showed overall LV function at the lower  limits of normal with an EF of 50-55%.  No left ventricular regional wall  abnormalities.   EKG showed a rate of 59 with competing junctional pacer.   Abdominal films on July 11 showed probable opaque foreign bodies in the  colon.   CT of the abdomen and pelvis on July 11 showed calcified structures in the  abdomen around the left kidney, presumably representing calcified lymph  nodes, rule out old granulomatous disease.  There are benign scattered  diverticula.  No evidence of diverticulitis.  No free air or fluid.   HOSPITAL COURSE:  Patient was admitted through the emergency room.  Placed  on sips of clear liquids, IV fluids, IV Protonix, Demerol and Phenergan for  pain and nausea.  Was scheduled for CT scan of the abdomen and pelvis with  findings as outlined above.  CT was unremarkable for any acute findings.  It  was felt  that most likely he had an acute infectious gastroenteritis  superimposed on any more subacute abdominal discomfort, which is felt to be  possibly peptic ulcer disease or gastritis.  He was noted to have an  asymptomatic bradycardia during his admission with rates between 50 to the  high 30s when he was asleep.  We did ask internal medicine to see him, as he  was asymptomatic.  A 2D echo was ordered to evaluate his LV function, and it  was felt that if this was normal, that he did not get any further inpatient  evaluation.  He remained asymptomatic from that standpoint, and actually,  his rate generally was in the 50s.  He did continue to complain of nausea  and inability to eat with gradual improvement with symptomatic support.  On  July 12, he underwent an upper endoscopy, which did show erosions in the  gastric fundus and erythema.  Biopsies were taken.  These returned showing  chronic active gastritis and H. pylori.  Due to the fact that he had been on  nauseated and anorexic, we opted to not treat his H. pylori gastritis  initially until he has a [name] to get over his gastroenteritis.  He was  allowed discharge on July 15 in a stable, improved condition, able to keep  down a low residue diet and without any further diarrhea or vomiting.  He  was to stay on a bland diet with Phenergan 25 q.6h. p.r.n. nausea and  vomiting, Protonix 40 mg q.d.  He was to stop his Zantac.  Stop Diclofenac,  which was felt to have precipitated the gastritis.  He is to use Tylenol on  a p.r.n. basis for his arthritic symptoms.  We will plan to treat his H.  pylori at the time of followup.  To see [name] back in one month and to  follow up with [name] on August 23 at 9:45 a.m. and to call for any  problems in the interim.     [name], P.A.-C. [facility]                [name], M.D. [facility]    AE/MEDQ  D:  06/03/2004  T:  06/03/2004  Job:  [id number]  cc:   [name].  Jonny [name], M.D. [facility]

## 2011-04-03 NOTE — H&P (Signed)
NAME:  [name], [name]                             ACCOUNT NO.:  [id number]   MEDICAL RECORD NO.:  [id number]                   PATIENT TYPE:  INP   LOCATION:  0363                                 [facility]ACILITY:  [facility]   PHYSICIAN:  [name], M.D. [facility]          DATE OF BIRTH:  13-Jun-1941   DATE OF ADMISSION:  05/26/2004  DATE OF DISCHARGE:                                HISTORY & PHYSICAL   CHIEF COMPLAINT:  Abdominal pain x2 weeks, now with nausea, vomiting, and  diarrhea.   HISTORY:  [name] is a 70 year old African-American male with a history of  osteoarthritis, hypertension, and hyperlipidemia.  He is known to [name] and remotely to [name].  He has a remote history of ETOH use, none  since 1990, and a history of alcohol-induced pancreatitis in 1986.  He was  last seen by [name] in 1999 when he had a colonoscopy revealing left  colon diverticulosis.  At this time, he presents to the ER with complaints  of two weeks of abdominal discomfort in the mid-to-lower abdomen, which has  become progressive and is now associated with nausea, vomiting, and diarrhea  over the past 24 hours.  Apparently, he felt the pain was similar to his  pancreatitis, and he describes this as a pressure type of pain.  He has had  no associated melena or hematochezia.  No fever or chills.  No dysuria,  urgency, hematuria, etc.  He did vomit once in the ER and vomited some pink-  tinged material which was not hemocculted.   Labs in the ER show a WBC of 10.9, hemoglobin 15.4, hematocrit 46.6, SGOT  43, SGPT 45, albumin 4.2, lipase 32.   KUB is unremarkable.  It does mention some calcifications.  Cannot rule out  left ureteral stones.   The patient has been on one aspirin per day and Naprosyn t.i.d.  Denies any  alcohol since 1990.   He was evaluated in the ER, having significant pain in the mid abdomen,  writhing in the bed, and is admitted for pain control and further diagnostic  evaluation with a CT scan of the abdomen and pelvis and possible EGD.   MEDICATIONS:  1. Diclofenac 50 t.i.d.  2. Lipitor 20, question b.i.d.  3. Zantac 300 q.d.  4. Norvasc 5 b.i.d.  5. Zyrtec 10 q.d.  6. Oxybutynin 5 mg q.i.d.   ALLERGIES:  No known drug allergies.   PAST MEDICAL HISTORY:  As outlined above.  He has had no previous abdominal  surgeries.   SOCIAL HISTORY:  Patient is married.  He is retired.  No tobacco.  No ETOH  since 1990.   FAMILY HISTORY:  Negative for GI.   REVIEW OF SYSTEMS:  Reviewed and negative other than as above.   PHYSICAL EXAMINATION:  VITAL SIGNS:  Temp 96.7, blood pressure 155/84, pulse  80s, respirations 22, sat  is 100 on room air.  GENERAL:  Well-developed African-American male.  He is alert, somewhat  agitated.  Writhing in the bed at the time of exam.  HEENT:  Normocephalic and atraumatic.  EOMI.  PERRLA.  Sclerae are  anicteric.  NECK:  Supple without nodes.  LUNGS:  Clear to A&P.  HEART:  Regular rate and rhythm with S1 and S2.  ABDOMEN:  Flat, soft.  Bowel sounds are active but decreased.  He is tender  in the lower abdomen, midline and left lower quadrant.  There is no guarding  or rebound.  No palpable mass or hepatosplenomegaly.  RECTAL:  Heme negative in the ER, not repeated.  EXTREMITIES:  Without clubbing, cyanosis or edema.   IMPRESSION:  81. A 70 year old white male with a two-week history of abdominal pain,     progressive, with nausea, vomiting, and diarrhea x24 hours.  The etiology     of the symptoms is not clear.  Rule out possible diverticulitis,     gastroenteritis, peptic ulcer disease.  2. Pink emesis.  Unclear whether or not this was blood.  Rule out     nonsteroidal-induced peptic ulcer disease.  3. Remote history of pancreatitis.  No current evidence.  4. Hypertension.  5. Hyperlipidemia.  6. Osteoarthritis.   PLAN:  The patient is admitted to the service of [name] for IV  fluids, hydration, pain  control, IV Protonix.  Will Hemoccult stools and  emesis.  Check stool culture and stools for WBCs.  CT scan of the abdomen  and pelvis now.  If this is unremarkable, will consider EGD on July 12.  For  details, please  the orders.     [name], P.A.-C. [facility]                [name], M.D. [facility]    AE/MEDQ  D:  05/26/2004  T:  05/26/2004  Job:  [id number]

## 2011-04-28 ENCOUNTER — Other Ambulatory Visit: Payer: Self-pay | Admitting: *Deleted

## 2011-04-28 MED ORDER — ACETAMINOPHEN-CODEINE #3 300-30 MG PO TABS: 1.0000 | ORAL_TABLET | ORAL | Status: DC | PRN

## 2011-04-28 NOTE — Telephone Encounter (Signed)
Pt requesting refill of Tylenol with Codeine #3 to be sent to [facility] [address]  Last written 03/12/2011, #90 with 0 refills.

## 2011-04-28 NOTE — Telephone Encounter (Signed)
Done hardcopy to a johnson/side b 

## 2011-04-29 NOTE — Telephone Encounter (Signed)
Rx faxed to [facility]

## 2011-05-28 ENCOUNTER — Other Ambulatory Visit: Payer: Self-pay | Admitting: *Deleted

## 2011-05-28 MED ORDER — ACETAMINOPHEN-CODEINE #3 300-30 MG PO TABS: 1.0000 | ORAL_TABLET | ORAL | Status: DC | PRN

## 2011-05-28 NOTE — Telephone Encounter (Signed)
R'cd fax from [facility] for Tylenol with Codeine refill-last filled 04/30/2011-please advise

## 2011-05-28 NOTE — Telephone Encounter (Signed)
Rx faxed to [facility].  

## 2011-05-29 ENCOUNTER — Other Ambulatory Visit: Payer: Self-pay

## 2011-05-29 MED ORDER — ACETAMINOPHEN-CODEINE #3 300-30 MG PO TABS: 1.0000 | ORAL_TABLET | Freq: Three times a day (TID) | ORAL | Status: DC | PRN

## 2011-06-01 ENCOUNTER — Other Ambulatory Visit: Payer: Self-pay

## 2011-06-01 MED ORDER — ACETAMINOPHEN-CODEINE #3 300-30 MG PO TABS: 1.0000 | ORAL_TABLET | Freq: Three times a day (TID) | ORAL | Status: DC | PRN

## 2011-06-01 NOTE — Telephone Encounter (Signed)
Rx Done . 

## 2011-06-01 NOTE — Telephone Encounter (Signed)
Pharmacy requesting clarification on refill Acetaminophen codeine, was one three times, please verify.

## 2011-06-01 NOTE — Telephone Encounter (Signed)
Done hardcopy to dahlia/LIM B  

## 2011-06-12 ENCOUNTER — Telehealth: Payer: Self-pay

## 2011-06-12 MED ORDER — OMEPRAZOLE 20 MG PO CPDR: 20.0000 mg | DELAYED_RELEASE_CAPSULE | Freq: Every day | ORAL | Status: DC

## 2011-06-12 NOTE — Telephone Encounter (Signed)
Pharmacy requesting prior authorization on Omeprazole due to Quantity prescribed ( two times per day). Pharmacist informed if omeprazole is  prescribed more than once a day they require prior authorization. They did not have a list of alternative. Please advise to initiate prior authorization or change to once per day?

## 2011-06-12 NOTE — Telephone Encounter (Signed)
Ok  For qd

## 2011-06-18 ENCOUNTER — Telehealth: Payer: Self-pay | Admitting: *Deleted

## 2011-06-18 NOTE — Telephone Encounter (Signed)
Received another Prior Authorization request from pharmacy for Omeprazole w/BID directions: LMOM for pharmacy to please check patient records for New Rx for patient sent on 06/12/2011 for Omeprazole 20 mg with new Sig: Take one PO daily

## 2011-06-23 ENCOUNTER — Telehealth: Payer: Self-pay

## 2011-06-23 NOTE — Telephone Encounter (Signed)
Ok to hold lisinopril and amlodipine for now;  Please check BP daily and call Friday this wk with results, consider OV if lightheaded persists

## 2011-06-23 NOTE — Telephone Encounter (Signed)
Pt advised of MD's recommendation 

## 2011-06-23 NOTE — Telephone Encounter (Signed)
Pt called stating he has been experiencing 1 week of dizziness and checked his BP yesterday and it was 80/72 and today 97/70. Pt has appt with Cardiology 08/13 but would like MD to advise, should he D/C BP meds and follow up with Cardiology or make appt with JWJ, please advise.

## 2011-06-29 ENCOUNTER — Encounter: Payer: Self-pay | Admitting: Internal Medicine

## 2011-06-29 ENCOUNTER — Ambulatory Visit (INDEPENDENT_AMBULATORY_CARE_PROVIDER_SITE_OTHER): Payer: Medicare Other | Admitting: Internal Medicine

## 2011-06-29 DIAGNOSIS — I4891 Unspecified atrial fibrillation: Secondary | ICD-10-CM

## 2011-06-29 DIAGNOSIS — I10 Essential (primary) hypertension: Secondary | ICD-10-CM

## 2011-06-29 DIAGNOSIS — I4892 Unspecified atrial flutter: Secondary | ICD-10-CM

## 2011-06-29 DIAGNOSIS — I495 Sick sinus syndrome: Secondary | ICD-10-CM

## 2011-06-29 LAB — CBC WITH DIFFERENTIAL/PLATELET
Basophils Relative: 0.5 % (ref 0.0–3.0)
Eosinophils Absolute: 0.1 10*3/uL (ref 0.0–0.7)
Eosinophils Relative: 1.7 % (ref 0.0–5.0)
Lymphocytes Relative: 30.1 % (ref 12.0–46.0)
Neutrophils Relative %: 58.1 % (ref 43.0–77.0)
Platelets: 154 10*3/uL (ref 150.0–400.0)
RBC: 4.56 Mil/uL (ref 4.22–5.81)
WBC: 8.1 10*3/uL (ref 4.5–10.5)

## 2011-06-29 LAB — BASIC METABOLIC PANEL
BUN: 14 mg/dL (ref 6–23)
Calcium: 9.1 mg/dL (ref 8.4–10.5)
Creatinine, Ser: 1 mg/dL (ref 0.4–1.5)

## 2011-06-29 MED ORDER — RIVAROXABAN 20 MG PO TABS: 20.0000 mg | ORAL_TABLET | Freq: Every day | ORAL | Status: DC

## 2011-06-29 MED ORDER — AMLODIPINE BESYLATE 5 MG PO TABS: 2.5000 mg | ORAL_TABLET | Freq: Every day | ORAL | Status: DC

## 2011-06-29 NOTE — Assessment & Plan Note (Signed)
Above goal today He recently stopped lisinopril and norvasc due to "low blood pressure". We will restart norvasc 2.5mg  daily. Adequate hydration is encouraged Check BMET and CBC today to further evaluation "low blood pressure".

## 2011-06-29 NOTE — Progress Notes (Signed)
The patient presents today for routine electrophysiology followup.  Since last being seen in our clinic, the patient reports doing reasonably well.  He continues to have stable fatigue.  Today, he denies symptoms of palpitations, chest pain, shortness of breath, orthopnea, PND, lower extremity edema, dizziness, presyncope, syncope, or neurologic sequela. He reports that he recently stopped his ace inhibitor and norvasc due to having "low blood pressure".  Past Medical History  Diagnosis Date  . Hypertension   . Hyperlipidemia   . BPH (benign prostatic hyperplasia)   . Diverticulosis of colon (without mention of hemorrhage)   . Esophageal reflux   . Abdominal pain, epigastric   . Depressive disorder, not elsewhere classified   . Lumbago   . Allergic rhinitis, cause unspecified   . Sick sinus syndrome     s/p PPM Conservation officer, historic buildings) by [employer]  . Persistent atrial fibrillation    Past Surgical History  Procedure Date  . Insert / replace / remove pacemaker 05/2009    Current Outpatient Prescriptions  Medication Sig Dispense Refill  . acetaminophen-codeine (TYLENOL #3) 300-30 MG per tablet Take 1 tablet by mouth 3 (three) times daily as needed.  90 tablet  2  . cetirizine (ZYRTEC) 10 MG tablet Take 10 mg by mouth as needed.        . fluticasone (FLOVENT DISKUS) 50 MCG/BLIST diskus inhaler Inhale 2 puffs into the lungs daily.        Marland Kitchen gabapentin (NEURONTIN) 800 MG tablet Take 800 mg by mouth 3 (three) times daily.        Marland Kitchen omeprazole (PRILOSEC) 20 MG capsule Take 1 capsule (20 mg total) by mouth daily.  90 capsule  3  . oxybutynin (DITROPAN-XL) 10 MG 24 hr tablet Take 10 mg by mouth daily.        . simvastatin (ZOCOR) 40 MG tablet 1/2 tab po qd      . Tamsulosin HCl (FLOMAX) 0.4 MG CAPS Take 0.4 mg by mouth daily.        . vardenafil (LEVITRA) 20 MG tablet Take 20 mg by mouth daily as needed.        Marland Kitchen amLODipine (NORVASC) 5 MG tablet Take 0.5 tablets (2.5 mg total) by mouth daily.      .  Rivaroxaban (XARELTO) 20 MG TABS Take 20 mg by mouth daily.  30 tablet  3    No Known Allergies  History   Social History  . Marital Status: Married    Spouse Name: N/A    Number of Children: N/A  . Years of Education: N/A   Occupational History  . retired    Social History Main Topics  . Smoking status: Former Games developer  . Smokeless tobacco: Not on file  . Alcohol Use: No  . Drug Use: No  . Sexually Active: Not on file   Other Topics Concern  . Not on file   Social History Narrative  . No narrative on file    Family History  Problem Relation Age of Onset  . Hypertension Father   . Lung cancer Father     ROS-  All systems are reviewed and are negative except as outlined in the HPI above    Physical Exam: Filed Vitals:   06/29/11 1207  BP: 129/93  Pulse: 80  Resp: 12  Weight: 204 lb (92.534 kg)    GEN- The patient is well appearing, alert and oriented x 3 today.   Head- normocephalic, atraumatic Eyes-  Sclera clear, conjunctiva pink  Ears- hearing intact Oropharynx- clear Neck- supple, no JVP Lymph- no cervical lymphadenopathy Lungs- Clear to ausculation bilaterally, normal work of breathing Chest- pacemaker pocket is well healed Heart- irregular rate and rhythm, no murmurs, rubs or gallops, PMI not laterally displaced GI- soft, NT, ND, + BS Extremities- no clubbing, cyanosis, or edema MS- no significant deformity or atrophy Skin- no rash or lesion Psych- euthymic mood, full affect Neuro- strength and sensation are intact  Pacemaker interrogation- reviewed in detail today,  See PACEART report  Assessment and Plan:

## 2011-06-29 NOTE — Patient Instructions (Addendum)
Your physician recommends that you schedule a follow-up appointment in: 3 weeks with Dr Johney Frame  Your physician has recommended you make the following change in your medication: start Xarelto 20mg  daily  Stay off Lisinopril Stop Aspirin and 1/2 Amlodipine to 2.5mg  daily

## 2011-06-29 NOTE — Assessment & Plan Note (Signed)
Pacemaker interrogation today reveals rate controlled atrial fibrillation. His CHADSVasc score is 2(age, HTN).  I have therefore recommended anticoagulation at this time. We will discussed coumadin, pradaxa, and xarelto at length.  He is clear that he wishes to start Xarelto at this time. He will return in 3 weeks.  If he remains in afib at that time, we will consider cardioversion.

## 2011-06-29 NOTE — Assessment & Plan Note (Signed)
Normal pacemaker function See Pace Art report No changes today  

## 2011-07-09 ENCOUNTER — Telehealth: Payer: Self-pay

## 2011-07-09 NOTE — Telephone Encounter (Signed)
Called the patient informed letter received from [employer] pretaining to Omeprazole appeal. The patient is no longer getting his omeprazole through [facility] and does not want to appeal.

## 2011-07-27 ENCOUNTER — Encounter: Payer: Self-pay | Admitting: *Deleted

## 2011-07-27 ENCOUNTER — Encounter: Payer: Self-pay | Admitting: Internal Medicine

## 2011-07-27 ENCOUNTER — Ambulatory Visit (INDEPENDENT_AMBULATORY_CARE_PROVIDER_SITE_OTHER): Payer: Medicare Other | Admitting: Internal Medicine

## 2011-07-27 DIAGNOSIS — I10 Essential (primary) hypertension: Secondary | ICD-10-CM

## 2011-07-27 DIAGNOSIS — I4891 Unspecified atrial fibrillation: Secondary | ICD-10-CM

## 2011-07-27 DIAGNOSIS — I495 Sick sinus syndrome: Secondary | ICD-10-CM

## 2011-07-27 LAB — CBC WITH DIFFERENTIAL/PLATELET
Basophils Absolute: 0 10*3/uL (ref 0.0–0.1)
Basophils Relative: 0.5 % (ref 0.0–3.0)
Eosinophils Absolute: 0.1 10*3/uL (ref 0.0–0.7)
HCT: 46.1 % (ref 39.0–52.0)
Hemoglobin: 14.9 g/dL (ref 13.0–17.0)
Lymphocytes Relative: 24.7 % (ref 12.0–46.0)
Lymphs Abs: 1.8 10*3/uL (ref 0.7–4.0)
MCHC: 32.4 g/dL (ref 30.0–36.0)
MCV: 90.3 fl (ref 78.0–100.0)
Monocytes Absolute: 0.7 10*3/uL (ref 0.1–1.0)
Neutro Abs: 4.5 10*3/uL (ref 1.4–7.7)
RBC: 5.11 Mil/uL (ref 4.22–5.81)
RDW: 15.4 % — ABNORMAL HIGH (ref 11.5–14.6)

## 2011-07-27 LAB — BASIC METABOLIC PANEL
CO2: 24 mEq/L (ref 19–32)
Calcium: 9.6 mg/dL (ref 8.4–10.5)
Chloride: 106 mEq/L (ref 96–112)
Glucose, Bld: 107 mg/dL — ABNORMAL HIGH (ref 70–99)
Potassium: 4.5 mEq/L (ref 3.5–5.1)
Sodium: 139 mEq/L (ref 135–145)

## 2011-07-27 LAB — PACEMAKER DEVICE OBSERVATION
AL IMPEDENCE PM: 480 Ohm
BAMS-0003: 70 {beats}/min
BRDY-0002RV: 60 {beats}/min
BRDY-0003RV: 150 {beats}/min
BRDY-0004RV: 150 {beats}/min
DEVICE MODEL PM: 764370
RV LEAD AMPLITUDE: 9.9 mv
RV LEAD THRESHOLD: 0.9 V

## 2011-07-27 NOTE — Progress Notes (Signed)
The patient presents today for routine electrophysiology followup.  Since last being seen in our clinic, the patient reports doing reasonably well.  He continues to have stable fatigue.  Today, he denies symptoms of palpitations, chest pain, shortness of breath, orthopnea, PND, lower extremity edema, dizziness, presyncope, syncope, or neurologic sequela. He reports that he recently stopped his ace inhibitor and norvasc due to having "low blood pressure".  Past Medical History  Diagnosis Date  . Hypertension   . Hyperlipidemia   . BPH (benign prostatic hyperplasia)   . Diverticulosis of colon (without mention of hemorrhage)   . Esophageal reflux   . Abdominal pain, epigastric   . Depressive disorder, not elsewhere classified   . Lumbago   . Allergic rhinitis, cause unspecified   . Sick sinus syndrome     s/p PPM Conservation officer, historic buildings) by [employer]  . Persistent atrial fibrillation    Past Surgical History  Procedure Date  . Insert / replace / remove pacemaker 05/2009    Current Outpatient Prescriptions  Medication Sig Dispense Refill  . acetaminophen-codeine (TYLENOL #3) 300-30 MG per tablet Take 1 tablet by mouth 3 (three) times daily as needed.  90 tablet  2  . amLODipine (NORVASC) 5 MG tablet Take 5 mg by mouth daily.        . cetirizine (ZYRTEC) 10 MG tablet Take 10 mg by mouth as needed.        . fluticasone (FLOVENT DISKUS) 50 MCG/BLIST diskus inhaler Inhale 2 puffs into the lungs daily.        Marland Kitchen gabapentin (NEURONTIN) 800 MG tablet Take 800 mg by mouth 3 (three) times daily.        Marland Kitchen omeprazole (PRILOSEC) 20 MG capsule Take 1 capsule (20 mg total) by mouth daily.  90 capsule  3  . oxybutynin (DITROPAN-XL) 10 MG 24 hr tablet Take 10 mg by mouth 2 (two) times daily.       . Rivaroxaban (XARELTO) 20 MG TABS Take 20 mg by mouth daily.  30 tablet  3  . simvastatin (ZOCOR) 40 MG tablet 1/2 tab po qd      . Tamsulosin HCl (FLOMAX) 0.4 MG CAPS Take 0.4 mg by mouth daily.        . vardenafil  (LEVITRA) 20 MG tablet Take 20 mg by mouth daily as needed.          No Known Allergies  History   Social History  . Marital Status: Married    Spouse Name: N/A    Number of Children: N/A  . Years of Education: N/A   Occupational History  . retired    Social History Main Topics  . Smoking status: Former Games developer  . Smokeless tobacco: Not on file  . Alcohol Use: No  . Drug Use: No  . Sexually Active: Not on file   Other Topics Concern  . Not on file   Social History Narrative  . No narrative on file    Family History  Problem Relation Age of Onset  . Hypertension Father   . Lung cancer Father     ROS-  All systems are reviewed and are negative except as outlined in the HPI above    Physical Exam: Filed Vitals:   07/27/11 0913  BP: 124/70  Pulse: 77  Height: 5\' 9"  (1.753 m)  Weight: 204 lb 6.4 oz (92.715 kg)    GEN- The patient is well appearing, alert and oriented x 3 today.   Head- normocephalic, atraumatic  Eyes-  Sclera clear, conjunctiva pink Ears- hearing intact Oropharynx- clear Neck- supple, no JVP Lymph- no cervical lymphadenopathy Lungs- Clear to ausculation bilaterally, normal work of breathing Chest- pacemaker pocket is well healed Heart- irregular rate and rhythm, no murmurs, rubs or gallops, PMI not laterally displaced GI- soft, NT, ND, + BS Extremities- no clubbing, cyanosis, or edema MS- no significant deformity or atrophy Skin- no rash or lesion Psych- euthymic mood, full affect Neuro- strength and sensation are intact  Pacemaker interrogation- reviewed in detail today,  See PACEART report  Assessment and Plan:

## 2011-07-27 NOTE — Patient Instructions (Signed)

## 2011-07-27 NOTE — Assessment & Plan Note (Signed)
He remains in afib Appropriately anticoagulated with Xarelto Risks, benefits, alternatives to cardioversion were discussed with pt We will proceed with cardioversion later this week We will hold off on AAD unless his afib recurs  I will see him again in 5 weeks.

## 2011-07-27 NOTE — Assessment & Plan Note (Signed)
Normal pacemaker function See Pace Art report No changes today  

## 2011-07-27 NOTE — Assessment & Plan Note (Signed)
Stable No change required today  

## 2011-07-28 ENCOUNTER — Ambulatory Visit (HOSPITAL_COMMUNITY)
Admission: RE | Admit: 2011-07-28 | Discharge: 2011-07-28 | Disposition: A | Discharge status: Home / Self Care | Payer: Medicare Other | Source: Ambulatory Visit | Attending: Cardiology | Admitting: Cardiology

## 2011-07-28 DIAGNOSIS — I4891 Unspecified atrial fibrillation: Secondary | ICD-10-CM | POA: Insufficient documentation

## 2011-07-28 DIAGNOSIS — I10 Essential (primary) hypertension: Secondary | ICD-10-CM | POA: Insufficient documentation

## 2011-07-28 DIAGNOSIS — Z950 Presence of cardiac pacemaker: Secondary | ICD-10-CM | POA: Insufficient documentation

## 2011-08-03 ENCOUNTER — Other Ambulatory Visit: Payer: Self-pay | Admitting: *Deleted

## 2011-08-03 ENCOUNTER — Telehealth: Payer: Self-pay | Admitting: Internal Medicine

## 2011-08-03 ENCOUNTER — Ambulatory Visit (INDEPENDENT_AMBULATORY_CARE_PROVIDER_SITE_OTHER): Payer: Medicare Other

## 2011-08-03 ENCOUNTER — Other Ambulatory Visit: Payer: Self-pay

## 2011-08-03 DIAGNOSIS — I4891 Unspecified atrial fibrillation: Secondary | ICD-10-CM

## 2011-08-03 MED ORDER — FLECAINIDE ACETATE 100 MG PO TABS: 100.0000 mg | ORAL_TABLET | Freq: Two times a day (BID) | ORAL | Status: DC

## 2011-08-03 MED ORDER — ACETAMINOPHEN-CODEINE #3 300-30 MG PO TABS: 1.0000 | ORAL_TABLET | Freq: Three times a day (TID) | ORAL | Status: DC | PRN

## 2011-08-03 NOTE — Telephone Encounter (Signed)
Done hardcopy to dahlia/LIM B  

## 2011-08-03 NOTE — Progress Notes (Signed)
Pt called today stating that he felt great after his cardioversion but now he feels like he did before it.  Had pt come into the office for EKG which demonstrated A Fib at a rate of 105 to 130.  Reviewed with Dr Johney Frame who started pt on Flecainide 100 mg twice a day.  RX sent into pharmacy and follow up appt scheduled.

## 2011-08-03 NOTE — Patient Instructions (Signed)
Please start Flecainide 100 mg one twice a day  Continue all other medications as listed  Follow up with Dr Johney Frame in 2 weeks (08/19/2011 at 12N).

## 2011-08-03 NOTE — Telephone Encounter (Signed)
Pt calling stating he felt great after his cardioversion but now is SOB again.  He will come into the office for an EKG today.

## 2011-08-03 NOTE — Telephone Encounter (Signed)
Pt had cardioversion last Tuesday and was feeling fine but now is having the same feelings as before.  He is very concerned.  Got real short of breath on Saturday while walking.  Please call him back and advise what to do.

## 2011-08-04 NOTE — Telephone Encounter (Signed)
Rx sent to pharmacy   

## 2011-08-19 ENCOUNTER — Encounter: Payer: Self-pay | Admitting: Internal Medicine

## 2011-08-19 ENCOUNTER — Ambulatory Visit (INDEPENDENT_AMBULATORY_CARE_PROVIDER_SITE_OTHER): Payer: Medicare Other | Admitting: Internal Medicine

## 2011-08-19 VITALS — BP 157/96 | HR 59 | Ht 69.0 in | Wt 208.0 lb

## 2011-08-19 DIAGNOSIS — I10 Essential (primary) hypertension: Secondary | ICD-10-CM

## 2011-08-19 DIAGNOSIS — I495 Sick sinus syndrome: Secondary | ICD-10-CM

## 2011-08-19 DIAGNOSIS — I4891 Unspecified atrial fibrillation: Secondary | ICD-10-CM

## 2011-08-19 DIAGNOSIS — R0602 Shortness of breath: Secondary | ICD-10-CM

## 2011-08-19 LAB — PACEMAKER DEVICE OBSERVATION: DEVICE MODEL PM: 764370

## 2011-08-19 MED ORDER — LISINOPRIL 10 MG PO TABS: 10.0000 mg | ORAL_TABLET | Freq: Every day | ORAL | Status: DC

## 2011-08-19 NOTE — Assessment & Plan Note (Signed)
Normal pacemaker function See Pace Art report No changes today  

## 2011-08-19 NOTE — Assessment & Plan Note (Signed)
Above goal Restart lisinopril 10mg  daily today (he recently stopped lisinopril 20 mg daily due to "low bp"

## 2011-08-19 NOTE — Assessment & Plan Note (Signed)
Given recent SOB, I will proceed with GXT myoview. IF he does not reach targer heart rate with GXT, the study should be converted to lexiscan myoview.

## 2011-08-19 NOTE — Progress Notes (Signed)
The patient presents today for routine electrophysiology followup. He recently returned to our office post cardioversion.  He was found to have returned to afib. I started him on flecainide.  He has done well since that time.  He does however recall having significant SOB and decreased exercise tolerance on Sunday while at a football game.  He found himself "give out" while walking to his car.  Today, he denies symptoms of palpitations, chest pain, further shortness of breath, orthopnea, PND, lower extremity edema, dizziness, presyncope, syncope, or neurologic sequela.   Past Medical History  Diagnosis Date  . Hypertension   . Hyperlipidemia   . BPH (benign prostatic hyperplasia)   . Diverticulosis of colon (without mention of hemorrhage)   . Esophageal reflux   . Abdominal pain, epigastric   . Depressive disorder, not elsewhere classified   . Lumbago   . Allergic rhinitis, cause unspecified   . Sick sinus syndrome     s/p PPM Conservation officer, historic buildings) by [employer]  . Persistent atrial fibrillation    Past Surgical History  Procedure Date  . Insert / replace / remove pacemaker 05/2009    Current Outpatient Prescriptions  Medication Sig Dispense Refill  . acetaminophen-codeine (TYLENOL #3) 300-30 MG per tablet Take 1 tablet by mouth 3 (three) times daily as needed.  90 tablet  2  . amLODipine (NORVASC) 5 MG tablet Take 5 mg by mouth daily.        . cetirizine (ZYRTEC) 10 MG tablet Take 10 mg by mouth as needed.        . flecainide (TAMBOCOR) 100 MG tablet Take 1 tablet (100 mg total) by mouth 2 (two) times daily.  60 tablet  6  . fluticasone (FLOVENT DISKUS) 50 MCG/BLIST diskus inhaler Inhale 2 puffs into the lungs daily.        Marland Kitchen gabapentin (NEURONTIN) 800 MG tablet Take 800 mg by mouth 3 (three) times daily.        Marland Kitchen omeprazole (PRILOSEC) 20 MG capsule Take 1 capsule (20 mg total) by mouth daily.  90 capsule  3  . oxybutynin (DITROPAN-XL) 10 MG 24 hr tablet Take 10 mg by mouth 2 (two) times daily.        . Rivaroxaban (XARELTO) 20 MG TABS Take 20 mg by mouth daily.  30 tablet  3  . simvastatin (ZOCOR) 40 MG tablet 1/2 tab po qd      . Tamsulosin HCl (FLOMAX) 0.4 MG CAPS Take 0.4 mg by mouth daily.        . vardenafil (LEVITRA) 20 MG tablet Take 20 mg by mouth daily as needed.        Marland Kitchen lisinopril (PRINIVIL,ZESTRIL) 10 MG tablet Take 1 tablet (10 mg total) by mouth daily.  30 tablet  11    No Known Allergies  History   Social History  . Marital Status: Married    Spouse Name: N/A    Number of Children: N/A  . Years of Education: N/A   Occupational History  . retired    Social History Main Topics  . Smoking status: Former Games developer  . Smokeless tobacco: Not on file  . Alcohol Use: No  . Drug Use: No  . Sexually Active: Not on file   Other Topics Concern  . Not on file   Social History Narrative  . No narrative on file    Family History  Problem Relation Age of Onset  . Hypertension Father   . Lung cancer Father  ROS-  All systems are reviewed and are negative except as outlined in the HPI above    Physical Exam: Filed Vitals:   08/19/11 1154  BP: 157/96  Pulse: 59  Height: 5\' 9"  (1.753 m)  Weight: 208 lb (94.348 kg)    GEN- The patient is well appearing, alert and oriented x 3 today.   Head- normocephalic, atraumatic Eyes-  Sclera clear, conjunctiva pink Ears- hearing intact Oropharynx- clear Neck- supple, no JVP Lymph- no cervical lymphadenopathy Lungs- Clear to ausculation bilaterally, normal work of breathing Chest- pacemaker pocket is well healed Heart- regular rate and rhythm, no murmurs, rubs or gallops, PMI not laterally displaced GI- soft, NT, ND, + BS Extremities- no clubbing, cyanosis, or edema MS- no significant deformity or atrophy Skin- no rash or lesion Psych- euthymic mood, full affect Neuro- strength and sensation are intact  Pacemaker interrogation- reviewed in detail today,  See PACEART report  Assessment and Plan:

## 2011-08-19 NOTE — Assessment & Plan Note (Signed)
Maintaining sinus rhythm with flecainide Continue xarelto

## 2011-08-20 NOTE — Op Note (Signed)
  NAMESALVATOR, SEPPALA NO.:  [id number]  MEDICAL RECORD NO.:  [id number]  LOCATION:  [facility]                         FACILITY:  [facility]  PHYSICIAN:  Hillis Range, MD       DATE OF BIRTH:  [birth year]  DATE OF PROCEDURE: DATE OF DISCHARGE:  07/28/2011                              OPERATIVE REPORT   PREPROCEDURE DIAGNOSIS:  Persistent atrial fibrillation.  POSTPROCEDURE DIAGNOSES:  Persistent atrial fibrillation.  PROCEDURES: 1. Pacemaker interrogation and reprogramming. 2. DC cardioversion.  INTRODUCTION:  Mr. Snelson is a pleasant 70 year old gentleman with a history of recently diagnosed persistent atrial fibrillation.  He presents today for cardioversion.  DESCRIPTION OF PROCEDURE:  Informed written consent was obtained and the patient was brought to the electrophysiology lab in the fasting state. He was adequately sedated with intravenous medications as outlined in the anesthesia report after adequate IV access and airway were assured. The patient presented today in atrial fibrillation.  He was successfully cardioverted to sinus rhythm with a single synchronized 200 joules biphasic shock delivered with cardioversion electrodes in the anterior- posterior thoracic configuration.  He remained in sinus rhythm thereafter.  Interrogation of his pacemaker was then performed.  This revealed a normal battery status.  The atrial lead P-waves measured greater than 0.75 mV with impedance of 470 ohms and a threshold of 0.9 volts at 0.4 milliseconds.  Right ventricular lead R-waves measured 8.8 mV with an impedance of 590 ohms and a threshold of 1 volt at 0.4 milliseconds.  He remains programmed DDD.  No changes were made today. There were no early apparent complications.  CONCLUSIONS: 1. Successful cardioversion to sinus rhythm. 2. No early apparent complications.     Hillis Range, MD     JA/MEDQ  D:  07/28/2011  T:  07/28/2011  Job:  [id number]  Electronically  Signed by Hillis Range MD on 08/20/2011 08:42:33 AM

## 2011-09-02 ENCOUNTER — Encounter: Payer: Medicare Other | Admitting: Internal Medicine

## 2011-09-02 ENCOUNTER — Ambulatory Visit (HOSPITAL_COMMUNITY): Payer: Medicare Other | Attending: Internal Medicine | Admitting: Radiology

## 2011-09-02 DIAGNOSIS — I4891 Unspecified atrial fibrillation: Secondary | ICD-10-CM | POA: Insufficient documentation

## 2011-09-02 DIAGNOSIS — R0989 Other specified symptoms and signs involving the circulatory and respiratory systems: Secondary | ICD-10-CM | POA: Insufficient documentation

## 2011-09-02 DIAGNOSIS — R0609 Other forms of dyspnea: Secondary | ICD-10-CM | POA: Insufficient documentation

## 2011-09-02 DIAGNOSIS — R0602 Shortness of breath: Secondary | ICD-10-CM

## 2011-09-02 MED ORDER — TECHNETIUM TC 99M TETROFOSMIN IV KIT
11.0000 | PACK | Freq: Once | INTRAVENOUS | Status: AC | PRN
  Administered 2011-09-02: 11 via INTRAVENOUS

## 2011-09-02 MED ORDER — REGADENOSON 0.4 MG/5ML IV SOLN
0.4000 mg | Freq: Once | INTRAVENOUS | Status: AC
  Administered 2011-09-02: 0.4 mg via INTRAVENOUS

## 2011-09-02 MED ORDER — TECHNETIUM TC 99M TETROFOSMIN IV KIT
33.0000 | PACK | Freq: Once | INTRAVENOUS | Status: AC | PRN
  Administered 2011-09-02: 33 via INTRAVENOUS

## 2011-09-02 NOTE — Progress Notes (Signed)
[facility] SITE 3 NUCLEAR MED [address] [place] [address] [phone]  Cardiology Nuclear Med Study  [name] is a 70 y.o. male [id number] 10/06/1941   Nuclear Med Background Indication for Stress Test:  Evaluation for Ischemia History:  '10 PTVP; 2/12 Echo:EF=55-60%; 9/12 Cardioversion; H/O Atrial Fibrillation Cardiac Risk Factors: History of Smoking, Hypertension, Lipids and Overweight  Symptoms:  DOE, Fatigue, Fatigue with Exertion, Decreased Exercise Tolerance and Syncope   Nuclear Pre-Procedure Caffeine/Decaff Intake:  None NPO After: 7:00pm   Lungs:  Clear.  O2 SAT 97% on RA IV 0.9% NS with Angio Cath:  20g  IV Site: R Hand  IV Started by:  Bonnita Levan, RN  Chest Size (in):  46 Cup Size: n/a  Height: 5\' 9"  (1.753 m)  Weight:  206 lb (93.441 kg)  BMI:  Body mass index is 30.42 kg/(m^2). Tech Comments:  N/A    Nuclear Med Study 1 or 2 day study: 1 day  Stress Test Type:  Stress  Reading MD: Marca Ancona, MD  Order Authorizing Provider:  Hillis Range, MD  Resting Radionuclide: Technetium 58m Tetrofosmin  Resting Radionuclide Dose: 11.0 mCi   Stress Radionuclide:  Technetium 53m Tetrofosmin  Stress Radionuclide Dose: 33.0 mCi           Stress Protocol Rest HR: 60 Stress HR: 65  Rest BP: 141/105 Stress BP: 150/108  Exercise Time (min): n/a METS: n/a   Predicted Max HR: 152 bpm % Max HR: 42.76 bpm Rate Pressure Product: 9750   Dose of Adenosine (mg):  n/a Dose of Lexiscan: 0.4 mg  Dose of Atropine (mg): n/a Dose of Dobutamine: n/a mcg/kg/min (at max HR)  Stress Test Technologist: Smiley Houseman, CMA-N  Nuclear Technologist:  Domenic Polite, CNMT     Rest Procedure:  Myocardial perfusion imaging was performed at rest 45 minutes following the intravenous administration of Technetium 70m Tetrofosmin.  Rest ECG: Atrial Paced.  Stress Procedure: The patient was originally scheduled to walk the treadmill utilizing the Bruce protocol,  but was unable to start due to baseline hypertension, 144/107.  He was changed to [place] and given IV Lexiscan 0.4 mg over 15-seconds.  Technetium 40m Tetrofosmin injected at 30-seconds.  There were no significant changes with Lexiscan.  Quantitative spect images were obtained after a 45 minute delay.  Stress ECG: No significant change from baseline ECG  QPS Raw Data Images:  Normal; no motion artifact; normal heart/lung ratio. Stress Images:  Normal homogeneous uptake in all areas of the myocardium. Rest Images:  Normal homogeneous uptake in all areas of the myocardium. Subtraction (SDS):  There is no evidence of scar or ischemia. Transient Ischemic Dilatation (Normal <1.22):  1.10 Lung/Heart Ratio (Normal <0.45):  0.29  Quantitative Gated Spect Images QGS EDV:  108 ml QGS ESV:  58 ml QGS cine images:  Lateral hypokinesis.  QGS EF: 46%  Impression Exercise Capacity:  Lexiscan with no exercise. BP Response:  Normal blood pressure response. Clinical Symptoms:  No symptoms.  ECG Impression:  No significant ST segment change suggestive of ischemia. Comparison with Prior Nuclear Study: No previous nuclear study performed  Overall Impression:  No evidence for scar or ischemia by perfusion images.  EF was mildly reduced at 46% with lateral hypokinesis.  Would confirm this finding with echo.   [name] [employer]

## 2011-09-07 ENCOUNTER — Other Ambulatory Visit: Payer: Self-pay | Admitting: *Deleted

## 2011-09-07 ENCOUNTER — Telehealth: Payer: Self-pay | Admitting: Internal Medicine

## 2011-09-07 DIAGNOSIS — R5383 Other fatigue: Secondary | ICD-10-CM

## 2011-09-07 NOTE — Telephone Encounter (Signed)
Pt had nuclear study last week and would like to get the results.  Please call him at [phone].

## 2011-09-07 NOTE — Telephone Encounter (Signed)
N/A.  LMTC. 

## 2011-09-07 NOTE — Telephone Encounter (Signed)
PT AWARE OF MYOVIEW RESULTS  AND OF THE NEED FOR AN ECHO.ECHO ORDERED AND SCHEDULED .Zack Seal

## 2011-09-07 NOTE — Telephone Encounter (Signed)
Pt returning your call

## 2011-09-08 ENCOUNTER — Ambulatory Visit (HOSPITAL_COMMUNITY): Payer: Medicare Other | Attending: Internal Medicine | Admitting: Radiology

## 2011-09-08 DIAGNOSIS — I10 Essential (primary) hypertension: Secondary | ICD-10-CM | POA: Insufficient documentation

## 2011-09-08 DIAGNOSIS — R0602 Shortness of breath: Secondary | ICD-10-CM | POA: Insufficient documentation

## 2011-09-08 DIAGNOSIS — I059 Rheumatic mitral valve disease, unspecified: Secondary | ICD-10-CM | POA: Insufficient documentation

## 2011-09-08 DIAGNOSIS — I379 Nonrheumatic pulmonary valve disorder, unspecified: Secondary | ICD-10-CM | POA: Insufficient documentation

## 2011-09-08 DIAGNOSIS — R5383 Other fatigue: Secondary | ICD-10-CM

## 2011-09-08 DIAGNOSIS — I079 Rheumatic tricuspid valve disease, unspecified: Secondary | ICD-10-CM | POA: Insufficient documentation

## 2011-09-16 NOTE — Progress Notes (Signed)
Pt was notified of results

## 2011-09-19 ENCOUNTER — Other Ambulatory Visit: Payer: Self-pay | Admitting: Internal Medicine

## 2011-09-29 ENCOUNTER — Ambulatory Visit: Payer: No Typology Code available for payment source | Admitting: Physician Assistant

## 2011-09-30 ENCOUNTER — Ambulatory Visit (INDEPENDENT_AMBULATORY_CARE_PROVIDER_SITE_OTHER): Payer: Medicare Other | Admitting: Internal Medicine

## 2011-09-30 ENCOUNTER — Encounter: Payer: Self-pay | Admitting: Internal Medicine

## 2011-09-30 DIAGNOSIS — R55 Syncope and collapse: Secondary | ICD-10-CM

## 2011-09-30 DIAGNOSIS — I495 Sick sinus syndrome: Secondary | ICD-10-CM

## 2011-09-30 DIAGNOSIS — I4891 Unspecified atrial fibrillation: Secondary | ICD-10-CM

## 2011-09-30 DIAGNOSIS — I10 Essential (primary) hypertension: Secondary | ICD-10-CM

## 2011-09-30 LAB — PACEMAKER DEVICE OBSERVATION
AL IMPEDENCE PM: 490 Ohm
AL THRESHOLD: 0.9 V
ATRIAL PACING PM: 68
BAMS-0002: 8 ms
BAMS-0003: 70 {beats}/min
DEVICE MODEL PM: 764370
RV LEAD IMPEDENCE PM: 660 Ohm

## 2011-09-30 MED ORDER — AMLODIPINE BESYLATE 10 MG PO TABS: 10.0000 mg | ORAL_TABLET | Freq: Every day | ORAL | Status: DC

## 2011-09-30 NOTE — Patient Instructions (Addendum)
Your physician wants you to follow-up in: 6 months with Dr Jacquiline Doe will receive a reminder letter in the mail two months in advance. If you don't receive a letter, please call our office to schedule the follow-up appointment.  Your physician has recommended you make the following change in your medication:  1) Increase your Norvasc to 10mg  daily (ok to take 2 of the 5mg  tablets together until you run out then pik up new priscription) 2) Take your Lisinopril 10mg 

## 2011-09-30 NOTE — Assessment & Plan Note (Signed)
Stable No change required today  

## 2011-09-30 NOTE — Progress Notes (Signed)
The patient presents today for routine electrophysiology followup.  He is doing well at this time.  His exercise tolerance is improved.  He is tolerating flecainide. His BP remains elevated.  Today, he denies symptoms of palpitations, chest pain, further shortness of breath, orthopnea, PND, lower extremity edema, dizziness, presyncope, syncope, or neurologic sequela.   Past Medical History  Diagnosis Date  . Hypertension   . Hyperlipidemia   . BPH (benign prostatic hyperplasia)   . Diverticulosis of colon (without mention of hemorrhage)   . Esophageal reflux   . Abdominal pain, epigastric   . Depressive disorder, not elsewhere classified   . Lumbago   . Allergic rhinitis, cause unspecified   . Sick sinus syndrome     s/p PPM Conservation officer, historic buildings) by [employer]  . Persistent atrial fibrillation    Past Surgical History  Procedure Date  . Insert / replace / remove pacemaker 05/2009    Current Outpatient Prescriptions  Medication Sig Dispense Refill  . acetaminophen-codeine (TYLENOL #3) 300-30 MG per tablet Take 1 tablet by mouth 3 (three) times daily as needed.  90 tablet  2  . ALL DAY ALLERGY 10 MG tablet 1 BY MOUTH ONCE DAILY AS NEEDED ALLERGIES  30 tablet  5  . amLODipine (NORVASC) 5 MG tablet Take 5 mg by mouth daily.        . flecainide (TAMBOCOR) 100 MG tablet Take 1 tablet (100 mg total) by mouth 2 (two) times daily.  60 tablet  6  . fluticasone (FLOVENT DISKUS) 50 MCG/BLIST diskus inhaler Inhale 2 puffs into the lungs daily.        Marland Kitchen gabapentin (NEURONTIN) 800 MG tablet Take 800 mg by mouth 3 (three) times daily.        Marland Kitchen lisinopril (PRINIVIL,ZESTRIL) 10 MG tablet Take 1 tablet (10 mg total) by mouth daily.  30 tablet  11  . omeprazole (PRILOSEC) 20 MG capsule Take 1 capsule (20 mg total) by mouth daily.  90 capsule  3  . oxybutynin (DITROPAN-XL) 10 MG 24 hr tablet Take 10 mg by mouth 2 (two) times daily.       . Rivaroxaban (XARELTO) 20 MG TABS Take 20 mg by mouth daily.  30 tablet  3    . simvastatin (ZOCOR) 40 MG tablet 1/2 tab po qd      . Tamsulosin HCl (FLOMAX) 0.4 MG CAPS Take 0.4 mg by mouth daily.        . vardenafil (LEVITRA) 20 MG tablet Take 20 mg by mouth daily as needed.          No Known Allergies  History   Social History  . Marital Status: Married    Spouse Name: N/A    Number of Children: N/A  . Years of Education: N/A   Occupational History  . retired    Social History Main Topics  . Smoking status: Former Games developer  . Smokeless tobacco: Not on file  . Alcohol Use: No  . Drug Use: No  . Sexually Active: Not on file   Other Topics Concern  . Not on file   Social History Narrative  . No narrative on file    Family History  Problem Relation Age of Onset  . Hypertension Father   . Lung cancer Father     ROS-  All systems are reviewed and are negative except as outlined in the HPI above    Physical Exam: Filed Vitals:   09/30/11 0955  BP: 162/90  Pulse: 50  Height: 5\' 9"  (1.753 m)  Weight: 209 lb (94.802 kg)    GEN- The patient is well appearing, alert and oriented x 3 today.   Head- normocephalic, atraumatic Eyes-  Sclera clear, conjunctiva pink Ears- hearing intact Oropharynx- clear Neck- supple, no JVP Lymph- no cervical lymphadenopathy Lungs- Clear to ausculation bilaterally, normal work of breathing Chest- pacemaker pocket is well healed Heart- regular rate and rhythm, no murmurs, rubs or gallops, PMI not laterally displaced GI- soft, NT, ND, + BS Extremities- no clubbing, cyanosis, or edema MS- no significant deformity or atrophy Skin- no rash or lesion Psych- euthymic mood, full affect Neuro- strength and sensation are intact  Pacemaker interrogation- reviewed in detail today,  See PACEART report Recent echo and myoview were reviewed with patient today   Assessment and Plan:

## 2011-09-30 NOTE — Assessment & Plan Note (Signed)
Increase norvasc 10mg  daily Take lisinopril 10mg  at night, consider increasing evening lisinopril to 20mg  daily if BP remains elevated

## 2011-09-30 NOTE — Assessment & Plan Note (Signed)
Normal pacemaker function See Pace Art report No changes today  

## 2011-10-14 ENCOUNTER — Other Ambulatory Visit: Payer: Self-pay | Admitting: Internal Medicine

## 2011-11-09 ENCOUNTER — Other Ambulatory Visit: Payer: Self-pay

## 2011-11-09 MED ORDER — ACETAMINOPHEN-CODEINE #3 300-30 MG PO TABS: 1.0000 | ORAL_TABLET | Freq: Three times a day (TID) | ORAL | Status: DC | PRN

## 2011-11-09 NOTE — Telephone Encounter (Signed)
Done hardcopy to robin  

## 2011-11-12 ENCOUNTER — Other Ambulatory Visit: Payer: Self-pay | Admitting: Internal Medicine

## 2011-11-12 DIAGNOSIS — I4892 Unspecified atrial flutter: Secondary | ICD-10-CM

## 2011-11-12 DIAGNOSIS — I495 Sick sinus syndrome: Secondary | ICD-10-CM

## 2011-11-12 MED ORDER — RIVAROXABAN 20 MG PO TABS: 20.0000 mg | ORAL_TABLET | Freq: Every day | ORAL | Status: DC

## 2011-11-19 ENCOUNTER — Encounter: Payer: Self-pay | Admitting: Internal Medicine

## 2011-11-19 ENCOUNTER — Telehealth: Payer: Self-pay | Admitting: Internal Medicine

## 2011-11-19 DIAGNOSIS — I495 Sick sinus syndrome: Secondary | ICD-10-CM

## 2011-11-19 DIAGNOSIS — I4892 Unspecified atrial flutter: Secondary | ICD-10-CM

## 2011-11-19 MED ORDER — RIVAROXABAN 20 MG PO TABS: 20.0000 mg | ORAL_TABLET | Freq: Every day | ORAL | Status: DC

## 2011-11-19 NOTE — Telephone Encounter (Signed)
Called and spoke with [facility], pt did not have his Xarelto card on file with them.  Pt just needs to bring all receipts, the original bag the Rx came in, along with the discount card and will get the refund for the overage.  Called and spoke with pt and explained to him, what he would need to do to get his refund.  Pt understands, and was heading back to the pharmacy.

## 2011-11-19 NOTE — Telephone Encounter (Signed)
Pt states medication cost was$120 and he was upset at the cost.

## 2011-11-19 NOTE — Telephone Encounter (Signed)
RX sent into [facility] 

## 2011-11-19 NOTE — Telephone Encounter (Signed)
New Problem:     Patient called in upset because his medication refill of Rivaroxaban (XARELTO) 20 MG TABS were sent to the wrong pharmacy and they are charging him much more than they would have at his regular pharmacy.  [facility] [address] (phone# - [phone]). Please advise.

## 2011-11-24 DIAGNOSIS — H2510 Age-related nuclear cataract, unspecified eye: Secondary | ICD-10-CM | POA: Diagnosis not present

## 2011-11-24 DIAGNOSIS — H4011X0 Primary open-angle glaucoma, stage unspecified: Secondary | ICD-10-CM | POA: Diagnosis not present

## 2011-12-24 ENCOUNTER — Encounter: Payer: Self-pay | Admitting: Internal Medicine

## 2011-12-24 ENCOUNTER — Ambulatory Visit (INDEPENDENT_AMBULATORY_CARE_PROVIDER_SITE_OTHER): Payer: Medicare Other | Admitting: Internal Medicine

## 2011-12-24 ENCOUNTER — Other Ambulatory Visit (INDEPENDENT_AMBULATORY_CARE_PROVIDER_SITE_OTHER): Payer: Medicare Other

## 2011-12-24 VITALS — BP 102/70 | HR 67 | Temp 99.3°F | Ht 69.0 in | Wt 212.2 lb

## 2011-12-24 DIAGNOSIS — M5412 Radiculopathy, cervical region: Secondary | ICD-10-CM | POA: Diagnosis not present

## 2011-12-24 DIAGNOSIS — N320 Bladder-neck obstruction: Secondary | ICD-10-CM

## 2011-12-24 DIAGNOSIS — I4892 Unspecified atrial flutter: Secondary | ICD-10-CM

## 2011-12-24 DIAGNOSIS — R5381 Other malaise: Secondary | ICD-10-CM

## 2011-12-24 DIAGNOSIS — J309 Allergic rhinitis, unspecified: Secondary | ICD-10-CM | POA: Diagnosis not present

## 2011-12-24 DIAGNOSIS — E785 Hyperlipidemia, unspecified: Secondary | ICD-10-CM

## 2011-12-24 DIAGNOSIS — Z0001 Encounter for general adult medical examination with abnormal findings: Secondary | ICD-10-CM | POA: Insufficient documentation

## 2011-12-24 DIAGNOSIS — I4891 Unspecified atrial fibrillation: Secondary | ICD-10-CM

## 2011-12-24 DIAGNOSIS — R5383 Other fatigue: Secondary | ICD-10-CM

## 2011-12-24 DIAGNOSIS — Z0000 Encounter for general adult medical examination without abnormal findings: Secondary | ICD-10-CM | POA: Insufficient documentation

## 2011-12-24 DIAGNOSIS — I495 Sick sinus syndrome: Secondary | ICD-10-CM

## 2011-12-24 DIAGNOSIS — I10 Essential (primary) hypertension: Secondary | ICD-10-CM

## 2011-12-24 LAB — CBC WITH DIFFERENTIAL/PLATELET
Basophils Absolute: 0 10*3/uL (ref 0.0–0.1)
Basophils Relative: 0.4 % (ref 0.0–3.0)
HCT: 49.5 % (ref 39.0–52.0)
Hemoglobin: 16.5 g/dL (ref 13.0–17.0)
Lymphs Abs: 2.3 10*3/uL (ref 0.7–4.0)
MCHC: 33.2 g/dL (ref 30.0–36.0)
Monocytes Relative: 10.3 % (ref 3.0–12.0)
Neutro Abs: 3.6 10*3/uL (ref 1.4–7.7)
RBC: 5.54 Mil/uL (ref 4.22–5.81)
RDW: 14.3 % (ref 11.5–14.6)

## 2011-12-24 LAB — URINALYSIS, ROUTINE W REFLEX MICROSCOPIC
Ketones, ur: NEGATIVE
Specific Gravity, Urine: >=1.03 (ref 1.000–1.030)
Urine Glucose: NEGATIVE
Urobilinogen, UA: 0.2 (ref 0.0–1.0)

## 2011-12-24 LAB — LIPID PANEL
HDL: 66.2 mg/dL (ref >39.00)
Total CHOL/HDL Ratio: 3
Triglycerides: 70 mg/dL (ref 0.0–149.0)
VLDL: 14 mg/dL (ref 0.0–40.0)

## 2011-12-24 LAB — BASIC METABOLIC PANEL
Calcium: 10 mg/dL (ref 8.4–10.5)
GFR: 89.17 mL/min (ref >60.00)
Sodium: 138 mEq/L (ref 135–145)

## 2011-12-24 LAB — HEPATIC FUNCTION PANEL
Alkaline Phosphatase: 64 U/L (ref 39–117)
Bilirubin, Direct: 0.2 mg/dL (ref 0.0–0.3)

## 2011-12-24 MED ORDER — VARDENAFIL HCL 20 MG PO TABS: 20.0000 mg | ORAL_TABLET | Freq: Every day | ORAL | Status: DC | PRN

## 2011-12-24 MED ORDER — TAMSULOSIN HCL 0.4 MG PO CAPS: 0.4000 mg | ORAL_CAPSULE | Freq: Every day | ORAL | Status: DC

## 2011-12-24 MED ORDER — LISINOPRIL 10 MG PO TABS: 10.0000 mg | ORAL_TABLET | Freq: Every day | ORAL | Status: DC

## 2011-12-24 MED ORDER — CETIRIZINE HCL 10 MG PO TABS: 10.0000 mg | ORAL_TABLET | Freq: Every day | ORAL | Status: DC

## 2011-12-24 MED ORDER — OXYBUTYNIN CHLORIDE ER 10 MG PO TB24: 10.0000 mg | ORAL_TABLET | Freq: Every day | ORAL | Status: DC

## 2011-12-24 MED ORDER — AMLODIPINE BESYLATE 10 MG PO TABS: 10.0000 mg | ORAL_TABLET | Freq: Every day | ORAL | Status: DC

## 2011-12-24 MED ORDER — FLECAINIDE ACETATE 100 MG PO TABS: 100.0000 mg | ORAL_TABLET | Freq: Two times a day (BID) | ORAL | Status: DC

## 2011-12-24 MED ORDER — ACETAMINOPHEN-CODEINE #3 300-30 MG PO TABS: 1.0000 | ORAL_TABLET | Freq: Three times a day (TID) | ORAL | Status: DC | PRN

## 2011-12-24 MED ORDER — SIMVASTATIN 40 MG PO TABS: 20.0000 mg | ORAL_TABLET | Freq: Every day | ORAL | Status: DC

## 2011-12-24 MED ORDER — GABAPENTIN 800 MG PO TABS: 800.0000 mg | ORAL_TABLET | Freq: Three times a day (TID) | ORAL | Status: DC

## 2011-12-24 MED ORDER — FLUTICASONE PROPIONATE (INHAL) 50 MCG/BLIST IN AEPB: 2.0000 | INHALATION_SPRAY | Freq: Every day | RESPIRATORY_TRACT | Status: DC

## 2011-12-24 MED ORDER — RIVAROXABAN 20 MG PO TABS: 20.0000 mg | ORAL_TABLET | Freq: Every day | ORAL | Status: DC

## 2011-12-24 MED ORDER — FLUTICASONE PROPIONATE 50 MCG/ACT NA SUSP: 2.0000 | Freq: Every day | NASAL | Status: DC

## 2011-12-24 NOTE — Patient Instructions (Addendum)
Take all new medications as prescribed  - the flonase Continue all other medications as before Your refills were sent to the pharmacy Please go to LAB in the Basement for the blood and/or urine tests to be done today Please call the phone number [phone] (the PhoneTree System) for results of testing in 2-3 days;  When calling, simply dial the number, and when prompted enter the MRN number above (the Medical Record Number) and the # key, then the message should start. You will be contacted regarding the referral for: MRI for the neck Please keep your appointments with your specialists as you have planned - Neurosurgury on next Monday, and cardiology (dr Johney Frame for May 2013) Please return in 6 months, or sooner if needed

## 2011-12-26 ENCOUNTER — Encounter: Payer: Self-pay | Admitting: Internal Medicine

## 2011-12-26 NOTE — Assessment & Plan Note (Signed)
Mild to mod, for add flonase asd,  to f/u any worsening symptoms or concerns 

## 2011-12-26 NOTE — Assessment & Plan Note (Signed)
Asympt, for labs today as he is due,  to f/u any worsening symptoms or concerns

## 2011-12-26 NOTE — Progress Notes (Signed)
  Subjective:    Patient ID: [name], male    DOB: 1941-10-08, 71 y.o.   MRN: [id number]  HPI  Here to f/u with 1 wk c/o new onset RUE pain/weak/numb assoc with neck and shoulder pain, without trauma.  Does have several wks ongoing nasal allergy symptoms with clear congestion, itch and sneeze, without fever, pain, ST, cough or wheezing.  Does have sense of ongoing fatigue, but denies signficant hypersomnolence.  Pt denies chest pain, increased sob or doe, wheezing, orthopnea, PND, increased LE swelling, palpitations, dizziness or syncope.  Pt denies new neurological symptoms such as new headache, or facial or extremity weakness or numbness  . Pt denies polydipsia, polyuria  Pt states overall good compliance with meds, trying to follow lower cholesterol diet, wt overall stable but little exercise however.    Denies urinary symptoms such as dysuria, frequency, urgency,or hematuria.  Pt denies fever, wt loss, night sweats, loss of appetite, or other constitutional symptoms Past Medical History  Diagnosis Date  . Hypertension   . Hyperlipidemia   . BPH (benign prostatic hyperplasia)   . Diverticulosis of colon (without mention of hemorrhage)   . Esophageal reflux   . Abdominal pain, epigastric   . Depressive disorder, not elsewhere classified   . Lumbago   . Allergic rhinitis, cause unspecified   . Sick sinus syndrome     s/p PPM Conservation officer, historic buildings) by [employer]  . Persistent atrial fibrillation    Past Surgical History  Procedure Date  . Insert / replace / remove pacemaker 05/2009    reports that he has quit smoking. He does not have any smokeless tobacco history on file. He reports that he does not drink alcohol or use illicit drugs. family history includes Hypertension in his father and Lung cancer in his father. No Known Allergies Current Outpatient Prescriptions on File Prior to Visit  Medication Sig Dispense Refill  . omeprazole (PRILOSEC) 20 MG capsule Take 1 capsule (20 mg total) by mouth  daily.  90 capsule  3   Review of Systems Review of Systems  Constitutional: Negative for diaphoresis and unexpected weight change.  HENT: Negative for drooling and tinnitus.   Eyes: Negative for photophobia and visual disturbance.  Respiratory: Negative for choking and stridor.   Gastrointestinal: Negative for vomiting and blood in stool.  Genitourinary: Negative for hematuria and decreased urine volume.     Objective:   Physical Exam BP 102/70  Pulse 67  Temp(Src) 99.3 F (37.4 C) (Oral)  Ht 5\' 9"  (1.753 m)  Wt 212 lb 4 oz (96.276 kg)  BMI 31.34 kg/m2  SpO2 97% Physical Exam  VS noted Constitutional: Pt appears well-developed and well-nourished.  HENT: Head: Normocephalic.  Right Ear: External ear normal.  Left Ear: External ear normal.  Bilat tm's mild erythema.  Sinus nontender.  Pharynx mild erythema Eyes: Conjunctivae and EOM are normal. Pupils are equal, round, and reactive to light.  Neck: Normal range of motion. Neck supple.  Cardiovascular: Normal rate and regular rhythm.   Pulmonary/Chest: Effort normal and breath sounds normal.  Abd:  Soft, NT, non-distended, + BS Neurological: Pt is alert. No cranial nerve deficit. RUE motor 4/5, sens/dtr intact Skin: Skin is warm. No erythema.  Psychiatric: Pt behavior is normal. Thought content normal.     Assessment & Plan:

## 2011-12-26 NOTE — Assessment & Plan Note (Signed)
stable overall by hx and exam, most recent data reviewed with pt, and pt to continue medical treatment as before Lab Results  Component Value Date   LDLCALC 88 12/25/2009

## 2011-12-26 NOTE — Assessment & Plan Note (Signed)
Right, new onset with neuro changes, for pain control, MRI and refer NS,  to f/u any worsening symptoms or concerns

## 2011-12-26 NOTE — Assessment & Plan Note (Signed)
stable overall by hx and exam, most recent data reviewed with pt, and pt to continue medical treatment as before BP Readings from Last 3 Encounters:  12/24/11 102/70  09/30/11 162/90  08/19/11 157/96

## 2011-12-28 ENCOUNTER — Telehealth: Payer: Self-pay | Admitting: Internal Medicine

## 2011-12-28 DIAGNOSIS — S139XXA Sprain of joints and ligaments of unspecified parts of neck, initial encounter: Secondary | ICD-10-CM | POA: Diagnosis not present

## 2011-12-28 DIAGNOSIS — M47812 Spondylosis without myelopathy or radiculopathy, cervical region: Secondary | ICD-10-CM | POA: Diagnosis not present

## 2011-12-28 MED ORDER — ATORVASTATIN CALCIUM 40 MG PO TABS: 40.0000 mg | ORAL_TABLET | Freq: Every day | ORAL | Status: DC

## 2011-12-28 NOTE — Telephone Encounter (Signed)
Pt wants to go ahead and change cholesterol meds, please call into [facility]

## 2011-12-28 NOTE — Telephone Encounter (Signed)
Called left the patient a message of all information.

## 2011-12-28 NOTE — Telephone Encounter (Signed)
Simvastatin changed to lipitor 40 mg  Will plan on re-check labs next visit

## 2011-12-31 ENCOUNTER — Telehealth: Payer: Self-pay | Admitting: Internal Medicine

## 2011-12-31 DIAGNOSIS — H538 Other visual disturbances: Secondary | ICD-10-CM

## 2011-12-31 NOTE — Telephone Encounter (Signed)
Done per emr 

## 2011-12-31 NOTE — Telephone Encounter (Signed)
The pt called and is hoping to get a referral to an opthamalogist, he states he discussed this at his last appt.  Please advise.  Thanks!!

## 2012-01-05 ENCOUNTER — Telehealth: Payer: Self-pay

## 2012-01-05 DIAGNOSIS — M25519 Pain in unspecified shoulder: Secondary | ICD-10-CM

## 2012-01-05 NOTE — Telephone Encounter (Signed)
Done per emr 

## 2012-01-05 NOTE — Telephone Encounter (Signed)
Patient would like a ortho. Referral for his shoulder.

## 2012-01-13 ENCOUNTER — Telehealth: Payer: Self-pay | Admitting: Internal Medicine

## 2012-01-13 DIAGNOSIS — M25819 Other specified joint disorders, unspecified shoulder: Secondary | ICD-10-CM | POA: Diagnosis not present

## 2012-01-13 DIAGNOSIS — M5020 Other cervical disc displacement, unspecified cervical region: Secondary | ICD-10-CM | POA: Diagnosis not present

## 2012-01-13 DIAGNOSIS — M5030 Other cervical disc degeneration, unspecified cervical region: Secondary | ICD-10-CM | POA: Diagnosis not present

## 2012-01-13 NOTE — Telephone Encounter (Signed)
SOB that's been going on for 3 weeks When he is sitting still okay  With up and moving around gets SOB  Will [place] and schedule appointment

## 2012-01-13 NOTE — Telephone Encounter (Signed)
New Problem   Patient experiencing SOB, fatigue, No dizziness, would like to be seen ASAP.  He can be reached at hm# [phone]

## 2012-01-13 NOTE — Telephone Encounter (Signed)
I agree. DB 

## 2012-01-13 NOTE — Telephone Encounter (Signed)
Patient is c/o difficulty swallowing, but his main complaint if SOB. He gets SOB with meals and this causes him to feel like his good is stuck at the top of his throat.  He states that he does not feel like he has a food impaction now.  He states he gets SOB when walking, eating, or other activity.  He is advised that we can set up an appt to evaluate his swallowing, but he needs to see his cardiologist of primary care for SOB.  He is advised to contact them now.  He is asked to call back and schedule an office visit to discuss dysphagia once he has his SOB evaluated.

## 2012-01-15 ENCOUNTER — Ambulatory Visit
Admission: RE | Admit: 2012-01-15 | Discharge: 2012-01-15 | Disposition: A | Discharge status: Home / Self Care | Payer: Medicare Other | Source: Ambulatory Visit | Attending: Nurse Practitioner | Admitting: Nurse Practitioner

## 2012-01-15 ENCOUNTER — Encounter: Payer: Self-pay | Admitting: Nurse Practitioner

## 2012-01-15 ENCOUNTER — Ambulatory Visit (INDEPENDENT_AMBULATORY_CARE_PROVIDER_SITE_OTHER): Payer: Medicare Other | Admitting: Nurse Practitioner

## 2012-01-15 VITALS — BP 118/80 | HR 60 | Ht 69.0 in | Wt 214.0 lb

## 2012-01-15 DIAGNOSIS — J9819 Other pulmonary collapse: Secondary | ICD-10-CM | POA: Diagnosis not present

## 2012-01-15 DIAGNOSIS — I10 Essential (primary) hypertension: Secondary | ICD-10-CM

## 2012-01-15 DIAGNOSIS — R0602 Shortness of breath: Secondary | ICD-10-CM

## 2012-01-15 LAB — CBC WITH DIFFERENTIAL/PLATELET
Basophils Absolute: 0 10*3/uL (ref 0.0–0.1)
Basophils Relative: 0.4 % (ref 0.0–3.0)
Eosinophils Absolute: 0.1 10*3/uL (ref 0.0–0.7)
Eosinophils Relative: 2.3 % (ref 0.0–5.0)
HCT: 42.8 % (ref 39.0–52.0)
Hemoglobin: 14 g/dL (ref 13.0–17.0)
Lymphocytes Relative: 31.9 % (ref 12.0–46.0)
Lymphs Abs: 2 10*3/uL (ref 0.7–4.0)
MCHC: 32.7 g/dL (ref 30.0–36.0)
MCV: 90.3 fl (ref 78.0–100.0)
Monocytes Absolute: 0.7 10*3/uL (ref 0.1–1.0)
Monocytes Relative: 11.6 % (ref 3.0–12.0)
Neutro Abs: 3.3 10*3/uL (ref 1.4–7.7)
Neutrophils Relative %: 53.8 % (ref 43.0–77.0)
Platelets: 193 10*3/uL (ref 150.0–400.0)
RBC: 4.74 Mil/uL (ref 4.22–5.81)
RDW: 14.5 % (ref 11.5–14.6)
WBC: 6.2 10*3/uL (ref 4.5–10.5)

## 2012-01-15 LAB — BASIC METABOLIC PANEL
BUN: 11 mg/dL (ref 6–23)
CO2: 27 mEq/L (ref 19–32)
Calcium: 9.2 mg/dL (ref 8.4–10.5)
Chloride: 105 mEq/L (ref 96–112)
Creatinine, Ser: 1 mg/dL (ref 0.4–1.5)
GFR: 91.13 mL/min (ref >60.00)
Glucose, Bld: 95 mg/dL (ref 70–99)
Potassium: 4.4 mEq/L (ref 3.5–5.1)
Sodium: 139 mEq/L (ref 135–145)

## 2012-01-15 LAB — BRAIN NATRIURETIC PEPTIDE: Pro B Natriuretic peptide (BNP): 4 pg/mL (ref 0.0–100.0)

## 2012-01-15 MED ORDER — FUROSEMIDE 40 MG PO TABS: 40.0000 mg | ORAL_TABLET | Freq: Every day | ORAL | Status: DC

## 2012-01-15 NOTE — Assessment & Plan Note (Addendum)
Not sure what to make of his DOE. He is able to use his treadmill with no difficulty whatsoever. We checked his O 2 sats. At rest he is 97%. He ambulated down the hallway in the office. Lowest reading of 87% noted. He is a remote smoker. He is on chronic anticoagulation therapy with Xarelto. Will send for CXR. Check labs today to include BNP, BMET and CBC. Will add Lasix 40 mg daily. Will need to see him back in a week or so. Have discussed with Dr. Johney Frame. He is in agreement. Will also need to consider PFT's on return. Further disposition to follow. Patient is agreeable to this plan and will call if any problems develop in the interim.   CXR results are reviewed today and shared with Dr. Johney Frame. Noted to have left base atelectasis with elevation of the left hemidiaphragm. Will need to consider further work up with fluoroscopy and PFT's on return visit.

## 2012-01-15 NOTE — Assessment & Plan Note (Signed)
Blood pressure looks good. No change with his current regimen.  

## 2012-01-15 NOTE — Progress Notes (Signed)
[name] Date of Birth: [birth year] Medical Record #[id number]  History of Present Illness: [name] is seen today for a work in visit. He is seen for Dr. Johney Frame. He is a 71 year old black male with history of atrial fib and has underlying pacemaker. He has had negative Myoview back in October except for an EF of 46%. Echo in October showed EF of 50 to 55%. His other problems include HTN and BPH.  He presents today for evaluation of shortness of breath that's been going on for the past 3 to 4 weeks. Not getting worse but not getting any better.  It is more related to activity (just walking and going up steps) and he feels ok at rest. Feels fatigued. Notes more abdominal bloating and early satiety. Has chronic edema of his legs that is not any worse. No real cough. He denies chest pain. He feels like "something is stuck" in his throat when he eats and when he does walk. His weight is up 5 pounds. He has been able to use his treadmill for 30 minutes at a time and has done that 3 x this week already without any symptoms whatsoever. He is a remote smoker. Last CXR was in 2011. He feels like his rhythm has been ok.   Current Outpatient Prescriptions on File Prior to Visit  Medication Sig Dispense Refill  . acetaminophen-codeine (TYLENOL #3) 300-30 MG per tablet Take 1 tablet by mouth 3 (three) times daily as needed.  90 tablet  2  . amLODipine (NORVASC) 10 MG tablet Take 1 tablet (10 mg total) by mouth daily.  90 tablet  3  . atorvastatin (LIPITOR) 40 MG tablet Take 1 tablet (40 mg total) by mouth daily.  90 tablet  3  . cetirizine (ZYRTEC) 10 MG tablet Take 1 tablet (10 mg total) by mouth daily.  90 tablet  3  . flecainide (TAMBOCOR) 100 MG tablet Take 1 tablet (100 mg total) by mouth 2 (two) times daily.  180 tablet  3  . fluticasone (FLONASE) 50 MCG/ACT nasal spray Place 2 sprays into the nose daily.  16 g  2  . fluticasone (FLOVENT DISKUS) 50 MCG/BLIST diskus inhaler Inhale 2 puffs into the lungs  daily.  3 Inhaler  3  . gabapentin (NEURONTIN) 800 MG tablet Take 1 tablet (800 mg total) by mouth 3 (three) times daily.  270 tablet  1  . lisinopril (PRINIVIL,ZESTRIL) 10 MG tablet Take 1 tablet (10 mg total) by mouth daily.  90 tablet  3  . omeprazole (PRILOSEC) 20 MG capsule Take 1 capsule (20 mg total) by mouth daily.  90 capsule  3  . oxybutynin (DITROPAN-XL) 10 MG 24 hr tablet Take 1 tablet (10 mg total) by mouth daily.  90 tablet  3  . Rivaroxaban (XARELTO) 20 MG TABS Take 20 mg by mouth daily.  90 tablet  3  . Tamsulosin HCl (FLOMAX) 0.4 MG CAPS Take 1 capsule (0.4 mg total) by mouth daily.  90 capsule  3  . vardenafil (LEVITRA) 20 MG tablet Take 1 tablet (20 mg total) by mouth daily as needed for erectile dysfunction.  10 tablet  11    No Known Allergies  Past Medical History  Diagnosis Date  . Hypertension   . Hyperlipidemia   . BPH (benign prostatic hyperplasia)   . Diverticulosis of colon (without mention of hemorrhage)   . Esophageal reflux   . Abdominal pain, epigastric   . Depressive disorder, not  elsewhere classified   . Lumbago   . Allergic rhinitis, cause unspecified   . Sick sinus syndrome     s/p PPM Conservation officer, historic buildings) by [employer]  . Persistent atrial fibrillation   . Chronic anticoagulation   . High risk medication use     Past Surgical History  Procedure Date  . Insert / replace / remove pacemaker 05/2009    History  Smoking status  . Former Smoker  Smokeless tobacco  . Not on file    History  Alcohol Use No    Family History  Problem Relation Age of Onset  . Hypertension Father   . Lung cancer Father     Review of Systems: The review of systems is per the HPI.  All other systems were reviewed and are negative.  Physical Exam: BP 118/80  Pulse 60  Ht 5\' 9"  (1.753 m)  Wt 214 lb (97.07 kg)  BMI 31.60 kg/m2  SpO2 96% Patient is very pleasant and in no acute distress. Skin is warm and dry. Color is normal.  HEENT is unremarkable.  Normocephalic/atraumatic. PERRL. Sclera are nonicteric. Neck is supple. No masses. No JVD. Lungs are clear. Cardiac exam shows a regular rate and rhythm. Abdomen is soft. Extremities are without edema. Gait and ROM are intact. No gross neurologic deficits noted.  LABORATORY DATA: EKG today shows atrial pacing. He is in sinus rhythm.   Assessment / Plan:

## 2012-01-15 NOTE — Patient Instructions (Signed)
We are going to check some labs today.  We are going to send you for a CXR today. Go to [facility] to [facility] on the first floor. You may walk in.  We are going to start you on Lasix 40 mg daily.   We will see you back in about 10 days with repeat labs. You do not need to fast.  Call the [facility] office at [phone] if you have any questions, problems or concerns.

## 2012-01-18 ENCOUNTER — Telehealth: Payer: Self-pay | Admitting: *Deleted

## 2012-01-18 DIAGNOSIS — R9389 Abnormal findings on diagnostic imaging of other specified body structures: Secondary | ICD-10-CM

## 2012-01-18 NOTE — Telephone Encounter (Signed)
Left message on VM, for Mr. Besson to call me to schedule some appointments per Dr. Johney Frame.

## 2012-01-18 NOTE — Telephone Encounter (Signed)
Addended by: Dennis Bast F on: 01/18/2012 04:38 PM   Modules accepted: Orders

## 2012-01-20 ENCOUNTER — Ambulatory Visit
Admission: RE | Admit: 2012-01-20 | Discharge: 2012-01-20 | Disposition: A | Discharge status: Home / Self Care | Payer: Medicare Other | Source: Ambulatory Visit | Attending: Internal Medicine | Admitting: Internal Medicine

## 2012-01-20 DIAGNOSIS — R9389 Abnormal findings on diagnostic imaging of other specified body structures: Secondary | ICD-10-CM

## 2012-01-20 DIAGNOSIS — J9819 Other pulmonary collapse: Secondary | ICD-10-CM | POA: Diagnosis not present

## 2012-01-20 DIAGNOSIS — I259 Chronic ischemic heart disease, unspecified: Secondary | ICD-10-CM | POA: Diagnosis not present

## 2012-01-20 DIAGNOSIS — R918 Other nonspecific abnormal finding of lung field: Secondary | ICD-10-CM | POA: Diagnosis not present

## 2012-01-20 MED ORDER — IOHEXOL 300 MG/ML  SOLN
75.0000 mL | Freq: Once | INTRAMUSCULAR | Status: AC | PRN
  Administered 2012-01-20: 75 mL via INTRAVENOUS

## 2012-01-25 ENCOUNTER — Encounter: Payer: Self-pay | Admitting: Nurse Practitioner

## 2012-01-25 ENCOUNTER — Ambulatory Visit (INDEPENDENT_AMBULATORY_CARE_PROVIDER_SITE_OTHER): Payer: Medicare Other | Admitting: Nurse Practitioner

## 2012-01-25 VITALS — BP 120/80 | HR 62 | Ht 69.0 in | Wt 216.0 lb

## 2012-01-25 DIAGNOSIS — J986 Disorders of diaphragm: Secondary | ICD-10-CM | POA: Diagnosis not present

## 2012-01-25 DIAGNOSIS — R0602 Shortness of breath: Secondary | ICD-10-CM | POA: Diagnosis not present

## 2012-01-25 NOTE — Patient Instructions (Addendum)
We are going to arrange for a breathing test (PFT's with diffusion) at the [place] office  We are going to arrange for you to see one of the Lung Doctors Delton Coombes, Gaspar Bidding)  We will see you back as scheduled in May.

## 2012-01-25 NOTE — Assessment & Plan Note (Signed)
He has an elevated diaphragm. Will arrange for PFT's and a visit with Pulmonary. We will see him back in May as scheduled. Patient is agreeable to this plan and will call if any problems develop in the interim.

## 2012-01-25 NOTE — Progress Notes (Signed)
[name] Date of Birth: 12-18-1940 Medical Record #[id number]  History of Present Illness: [name] is seen back today for a follow up visit. He is seen for Dr. Johney Frame. He was here earlier this month with shortness of breath. He has had a negative Muyoview back in October except for an EF of 46%. His last echo was in October and showed an EF of 50 to 55%. His other problems include HTN and BPH. When I saw him at his last visit, he was complaining of DOE for about a month. Had some associated fatigue. We sent him for a CXR. 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 There was paradoxical motion per SNIFF test. He has had a negative CT for tumor. He has had no recent procedures. This was not noted on past CXR following his pacemaker implant.   He comes in today. He still feels the same. He did get a short course of Lasix with no real improvement. No chest pain. No cough. Does feel like his food gets stuck at times and is planning on following up with Dr. Juanda Chance.   Current Outpatient Prescriptions on File Prior to Visit  Medication Sig Dispense Refill  . acetaminophen-codeine (TYLENOL #3) 300-30 MG per tablet Take 1 tablet by mouth 3 (three) times daily as needed.  90 tablet  2  . amLODipine (NORVASC) 10 MG tablet Take 1 tablet (10 mg total) by mouth daily.  90 tablet  3  . atorvastatin (LIPITOR) 40 MG tablet Take 1 tablet (40 mg total) by mouth daily.  90 tablet  3  . cetirizine (ZYRTEC) 10 MG tablet Take 1 tablet (10 mg total) by mouth daily.  90 tablet  3  . flecainide (TAMBOCOR) 100 MG tablet Take 1 tablet (100 mg total) by mouth 2 (two) times daily.  180 tablet  3  . fluticasone (FLONASE) 50 MCG/ACT nasal spray Place 2 sprays into the nose daily.  16 g  2  . gabapentin (NEURONTIN) 800 MG tablet Take 1 tablet (800 mg total) by mouth 3 (three) times daily.  270 tablet  1  . lisinopril (PRINIVIL,ZESTRIL) 10 MG tablet Take 1 tablet (10 mg total) by mouth daily.  90 tablet  3  .  omeprazole (PRILOSEC) 20 MG capsule Take 1 capsule (20 mg total) by mouth daily.  90 capsule  3  . oxybutynin (DITROPAN-XL) 10 MG 24 hr tablet Take 1 tablet (10 mg total) by mouth daily.  90 tablet  3  . Rivaroxaban (XARELTO) 20 MG TABS Take 20 mg by mouth daily.  90 tablet  3  . Tamsulosin HCl (FLOMAX) 0.4 MG CAPS Take 1 capsule (0.4 mg total) by mouth daily.  90 capsule  3  . vardenafil (LEVITRA) 20 MG tablet Take 1 tablet (20 mg total) by mouth daily as needed for erectile dysfunction.  10 tablet  11    No Known Allergies  Past Medical History  Diagnosis Date  . Hypertension   . Hyperlipidemia   . BPH (benign prostatic hyperplasia)   . Diverticulosis of colon (without mention of hemorrhage)   . Esophageal reflux   . Abdominal pain, epigastric   . Depressive disorder, not elsewhere classified   . Lumbago   . Allergic rhinitis, cause unspecified   . Sick sinus syndrome     s/p PPM Conservation officer, historic buildings) by [employer]  . Persistent atrial fibrillation   . Chronic anticoagulation   . High risk medication use   . Paralyzed hemidiaphragm  LEFT    Past Surgical History  Procedure Date  . Insert / replace / remove pacemaker 05/2009    History  Smoking status  . Former Smoker  Smokeless tobacco  . Not on file    History  Alcohol Use No    Family History  Problem Relation Age of Onset  . Hypertension Father   . Lung cancer Father     Review of Systems: The review of systems is per the HPI.  All other systems were reviewed and are negative.  Physical Exam: BP 120/80  Pulse 62  Ht 5\' 9"  (1.753 m)  Wt 216 lb (97.977 kg)  BMI 31.90 kg/m2  SpO2 98% Patient is very pleasant and in no acute distress. Skin is warm and dry. Color is normal.  HEENT is unremarkable. Normocephalic/atraumatic. PERRL. Sclera are nonicteric. Neck is supple. No masses. No JVD. Lungs are clear except decreased on the left today. Cardiac exam shows a regular rate and rhythm. Abdomen is soft. Extremities  are without edema. Gait and ROM are intact. No gross neurologic deficits noted.  LABORATORY DATA: Lab Results  Component Value Date   WBC 6.2 01/15/2012   HGB 14.0 01/15/2012   HCT 42.8 01/15/2012   PLT 193.0 01/15/2012   GLUCOSE 95 01/15/2012   CHOL 213* 12/24/2011   TRIG 70.0 12/24/2011   HDL 66.20 12/24/2011   LDLDIRECT 124.0 12/24/2011   LDLCALC 88 12/25/2009   ALT 24 12/24/2011   AST 29 12/24/2011   NA 139 01/15/2012   K 4.4 01/15/2012   CL 105 01/15/2012   CREATININE 1.0 01/15/2012   BUN 11 01/15/2012   CO2 27 01/15/2012   TSH 0.97 12/24/2011   PSA 1.48 12/24/2011   INR 1.3 06/02/2009   Ct Chest W Contrast  01/20/2012  *RADIOLOGY REPORT*  Clinical Data: Abnormal chest x-ray with elevation of left hemidiaphragm and left basilar pulmonary opacity.  Ex-smoker.  CT CHEST WITH CONTRAST  Technique:  Multidetector CT imaging of the chest was performed following the standard protocol during bolus administration of intravenous contrast.  Contrast: 75mL OMNIPAQUE IOHEXOL 300 MG/ML IJ SOLN  Comparison: Chest radiographs 01/15/2012 and 03/16/2010.  Abdominal CT 06/07/2009.  Findings: As demonstrated on the recent radiographs, there is new elevation of the left hemidiaphragm associated with mild left lower lobe atelectasis.  There is no pleural effusion.  The right lung is clear.  No endobronchial lesion is demonstrated.  There is no evidence of mediastinal mass or lymphadenopathy.  The thoracic inlet appears normal.  Coronary artery calcifications are noted.  The left subclavian pacemaker leads appear unchanged within the right atrium and right ventricle.  The visualized upper abdomen has a stable appearance with several calcified lymph nodes.  IMPRESSION:  1.  Stable left hemidiaphragm elevation with associated left basilar atelectasis compared with recent chest radiographs. 2.  No explanation for this is demonstrated by this examination. Specifically, no evidence of mediastinal mass. 3.  Coronary disease and calcified abdominal lymph  nodes noted.  Original Report Authenticated By: Gerrianne Scale, M.D.   Dg Fluoro Rm 1-60 Min  01/20/2012  *RADIOLOGY REPORT*  Clinical Data: Elevation of the left hemidiaphragm on chest x-ray  FLOURO RM 1-60 MIN  Comparison: Chest x-ray of 30 11/2011 - - - fluoroscopy time of 0.4- minute  Findings: Using the sniff test as well as deep inspiration and deep expiration, there is paradoxical motion of the left hemidiaphragm. Permanent pacemaker wires are noted.  IMPRESSION: Paradoxical motion of the left  hemidiaphragm.  Original Report Authenticated By: Juline Patch, M.D.   Assessment / Plan:

## 2012-01-26 DIAGNOSIS — N529 Male erectile dysfunction, unspecified: Secondary | ICD-10-CM | POA: Diagnosis not present

## 2012-02-02 ENCOUNTER — Other Ambulatory Visit: Payer: Medicare Other

## 2012-02-26 ENCOUNTER — Encounter: Payer: Self-pay | Admitting: Emergency Medicine

## 2012-02-26 ENCOUNTER — Ambulatory Visit (INDEPENDENT_AMBULATORY_CARE_PROVIDER_SITE_OTHER): Payer: Medicare Other | Admitting: Emergency Medicine

## 2012-02-26 VITALS — BP 118/88 | HR 62 | Temp 97.7°F | Ht 69.0 in | Wt 208.0 lb

## 2012-02-26 DIAGNOSIS — R0602 Shortness of breath: Secondary | ICD-10-CM | POA: Diagnosis not present

## 2012-02-26 DIAGNOSIS — R0609 Other forms of dyspnea: Secondary | ICD-10-CM

## 2012-02-26 DIAGNOSIS — R0989 Other specified symptoms and signs involving the circulatory and respiratory systems: Secondary | ICD-10-CM | POA: Diagnosis not present

## 2012-02-26 LAB — PULMONARY FUNCTION TEST

## 2012-02-26 NOTE — Patient Instructions (Signed)
Your breathing tests show that the size of your breaths are restricted You may benefit from learning to use albuterol, 2 puffs up to every 4 hours as needed. We will try this and then discuss the benefits at your next visit Follow with Dr Delton Coombes in 6 weeks.

## 2012-02-26 NOTE — Assessment & Plan Note (Addendum)
Based on his PFT today and his workup to date, I am afraid that this likely relates to his paralyzed L HD. This is unfortunate because there isn';t any good way to address this beyond getting in better overall cardiopulm condition. His spiro does show some potential AFL, and although this is less prominent it is probably worth trying to treat.  - will walk him on stairs to see if he desaturates >> dropped to 85% after 2 flights of stairs - will teach him SABA and have him use prn as a trial - f/u in 6 weeks to see if the albuterol helps

## 2012-02-26 NOTE — Progress Notes (Signed)
PFT done today. 

## 2012-02-26 NOTE — Progress Notes (Signed)
Subjective:    Patient ID: [name], male    DOB: 02-Oct-1941, 71 y.o.   MRN: [id number]  HPI 71 yo former smoker with HTN, hyperlipidemia, s/p pacer for sick sinus syndrome. He has been evaluated by Dr Johney Frame and Norma Fredrickson for dyspnea over the last year. The evaluation is most notable for paralyzed L hemidiaphragm (this wasn't present immediately after pacer was placed). Also with reassuring myoview stress test in 10/12, TTE w LVEF 50-55% in 10/12. He underwent PFT today 02/26/12 - spiro most consistent w restriction + possible superimposed obstruction. No BD response. Lung volumes confirm restriction. DLCO decreased but corrects for Va.  His SOB is w exertion, happens with certain activities. He is able to walk at slow pace, able to do some yard work, but trouble climbing stairs. He also gets SOB if he talks to much, bends over. He has experienced some cough, non-productive. He has wheeze with activity. Never tried a SABA to see if he benefits.  He had walking oximetry, said that he did not desat.    Review of Systems  Constitutional: Negative.  Negative for fever and unexpected weight change.  HENT: Negative for ear pain, nosebleeds, congestion, sore throat, rhinorrhea, sneezing, trouble swallowing, dental problem, postnasal drip and sinus pressure.        Throat clearing  Eyes: Negative.  Negative for redness and itching.  Respiratory: Positive for cough, shortness of breath and wheezing. Negative for chest tightness.   Cardiovascular: Positive for leg swelling. Negative for palpitations.  Gastrointestinal: Negative.  Negative for nausea and vomiting.  Genitourinary: Negative.  Negative for dysuria.  Musculoskeletal: Positive for arthralgias. Negative for joint swelling.  Skin: Negative.  Negative for rash.  Neurological: Negative.  Negative for headaches.  Hematological: Negative.  Does not bruise/bleed easily.  Psychiatric/Behavioral: Negative.  Negative for dysphoric mood. The patient  is not nervous/anxious.     Past Medical History  Diagnosis Date  . Hypertension   . Hyperlipidemia   . BPH (benign prostatic hyperplasia)   . Diverticulosis of colon (without mention of hemorrhage)   . Esophageal reflux   . Abdominal pain, epigastric   . Depressive disorder, not elsewhere classified   . Lumbago   . Allergic rhinitis, cause unspecified   . Sick sinus syndrome     s/p PPM Conservation officer, historic buildings) by [employer]  . Persistent atrial fibrillation   . Chronic anticoagulation   . High risk medication use   . Paralyzed hemidiaphragm     LEFT     Family History  Problem Relation Age of Onset  . Hypertension Father   . Lung cancer Father   . Asthma Sister   . Arthritis Paternal Grandmother      History   Social History  . Marital Status: Married    Spouse Name: N/A    Number of Children: N/A  . Years of Education: N/A   Occupational History  . retired    Social History Main Topics  . Smoking status: Former Smoker -- 0.5 packs/day for 25 years    Types: Cigarettes    Quit date: 11/16/1978  . Smokeless tobacco: Not on file  . Alcohol Use: No  . Drug Use: No  . Sexually Active: Not on file   Other Topics Concern  . Not on file   Social History Narrative  . No narrative on file     No Known Allergies   Outpatient Prescriptions Prior to Visit  Medication Sig Dispense Refill  . acetaminophen-codeine (  TYLENOL #3) 300-30 MG per tablet Take 1 tablet by mouth 3 (three) times daily as needed.  90 tablet  2  . amLODipine (NORVASC) 10 MG tablet Take 1 tablet (10 mg total) by mouth daily.  90 tablet  3  . atorvastatin (LIPITOR) 40 MG tablet Take 1 tablet (40 mg total) by mouth daily.  90 tablet  3  . cetirizine (ZYRTEC) 10 MG tablet Take 1 tablet (10 mg total) by mouth daily.  90 tablet  3  . flecainide (TAMBOCOR) 100 MG tablet Take 1 tablet (100 mg total) by mouth 2 (two) times daily.  180 tablet  3  . fluticasone (FLONASE) 50 MCG/ACT nasal spray Place 2 sprays into  the nose daily.  16 g  2  . gabapentin (NEURONTIN) 800 MG tablet Take 1 tablet (800 mg total) by mouth 3 (three) times daily.  270 tablet  1  . lisinopril (PRINIVIL,ZESTRIL) 10 MG tablet Take 1 tablet (10 mg total) by mouth daily.  90 tablet  3  . omeprazole (PRILOSEC) 20 MG capsule Take 1 capsule (20 mg total) by mouth daily.  90 capsule  3  . oxybutynin (DITROPAN-XL) 10 MG 24 hr tablet Take 1 tablet (10 mg total) by mouth daily.  90 tablet  3  . Rivaroxaban (XARELTO) 20 MG TABS Take 20 mg by mouth daily.  90 tablet  3  . Tamsulosin HCl (FLOMAX) 0.4 MG CAPS Take 1 capsule (0.4 mg total) by mouth daily.  90 capsule  3  . vardenafil (LEVITRA) 20 MG tablet Take 1 tablet (20 mg total) by mouth daily as needed for erectile dysfunction.  10 tablet  11        Objective:   Physical Exam  Gen: Pleasant, well-nourished, in no distress,  normal affect  ENT: No lesions,  mouth clear,  oropharynx clear, no postnasal drip  Neck: No JVD, no TMG, no carotid bruits  Lungs: No use of accessory muscles, no dullness to percussion, decreased BS at L base, no wheezes  Cardiovascular: RRR, heart sounds normal, no murmur or gallops, trace edema   Musculoskeletal: No deformities, no cyanosis or clubbing  Neuro: alert, non focal  Skin: Warm, no lesions or rashes      Assessment & Plan:  DOE (dyspnea on exertion) Based on his PFT today and his workup to date, I am afraid that this likely relates to his paralyzed L HD. This is unfortunate because there isn';t any good way to address this beyond getting in better overall cardiopulm condition. His spiro does show some potential AFL, and although this is less prominent it is probably worth trying to treat.  - will walk him on stairs to see if he desaturates - will teach him SABA and have him use prn as a trial - f/u in 6 weeks to see if the albuterol helps

## 2012-03-01 ENCOUNTER — Encounter: Payer: Self-pay | Admitting: Emergency Medicine

## 2012-04-04 ENCOUNTER — Encounter: Payer: Medicare Other | Admitting: Internal Medicine

## 2012-04-08 ENCOUNTER — Ambulatory Visit: Payer: Medicare Other | Admitting: Emergency Medicine

## 2012-04-13 ENCOUNTER — Other Ambulatory Visit: Payer: Self-pay | Admitting: Cardiology

## 2012-04-13 ENCOUNTER — Ambulatory Visit (INDEPENDENT_AMBULATORY_CARE_PROVIDER_SITE_OTHER): Payer: Medicare Other | Admitting: Internal Medicine

## 2012-04-13 ENCOUNTER — Encounter (INDEPENDENT_AMBULATORY_CARE_PROVIDER_SITE_OTHER): Payer: Medicare Other

## 2012-04-13 ENCOUNTER — Encounter: Payer: Self-pay | Admitting: Internal Medicine

## 2012-04-13 VITALS — BP 110/64 | HR 61 | Temp 98.1°F | Ht 69.0 in | Wt 208.5 lb

## 2012-04-13 DIAGNOSIS — R52 Pain, unspecified: Secondary | ICD-10-CM

## 2012-04-13 DIAGNOSIS — F3289 Other specified depressive episodes: Secondary | ICD-10-CM

## 2012-04-13 DIAGNOSIS — F329 Major depressive disorder, single episode, unspecified: Secondary | ICD-10-CM | POA: Diagnosis not present

## 2012-04-13 DIAGNOSIS — R609 Edema, unspecified: Secondary | ICD-10-CM

## 2012-04-13 DIAGNOSIS — I10 Essential (primary) hypertension: Secondary | ICD-10-CM | POA: Diagnosis not present

## 2012-04-13 DIAGNOSIS — M7989 Other specified soft tissue disorders: Secondary | ICD-10-CM | POA: Diagnosis not present

## 2012-04-13 DIAGNOSIS — M79609 Pain in unspecified limb: Secondary | ICD-10-CM | POA: Diagnosis not present

## 2012-04-13 DIAGNOSIS — M79604 Pain in right leg: Secondary | ICD-10-CM

## 2012-04-13 NOTE — Patient Instructions (Addendum)
Continue all other medications as before Please see the [facility] today for scheduling of the right leg ultrasound test, to make sure there is no blood clot in the leg You will be contacted regarding the referral for: orthopedic (assuming the test is negative) Please have the pharmacy call with any refills you may need.

## 2012-04-15 ENCOUNTER — Encounter: Payer: Self-pay | Admitting: Internal Medicine

## 2012-04-15 ENCOUNTER — Other Ambulatory Visit: Payer: Self-pay | Admitting: Internal Medicine

## 2012-04-15 DIAGNOSIS — M25561 Pain in right knee: Secondary | ICD-10-CM

## 2012-04-15 DIAGNOSIS — M7989 Other specified soft tissue disorders: Secondary | ICD-10-CM

## 2012-04-17 ENCOUNTER — Encounter: Payer: Self-pay | Admitting: Internal Medicine

## 2012-04-17 DIAGNOSIS — M7989 Other specified soft tissue disorders: Secondary | ICD-10-CM | POA: Insufficient documentation

## 2012-04-17 DIAGNOSIS — M79604 Pain in right leg: Secondary | ICD-10-CM | POA: Insufficient documentation

## 2012-04-17 NOTE — Progress Notes (Signed)
Subjective:    Patient ID: [name], male    DOB: [birth year], 71 y.o.   MRN: [id number]  HPI  Here to f/u;  Has recent finding of left hemidiaphragm dysfxn, has seen pulmonary and has f/u; unfortunately not much improved with albuterol.   Today has bilat LE edema right > left but right much more worse than usual in the past 1 wk with some discomfort, but no fever, redness, drainage, trauma, ulcer and Pt denies chest pain, increased sob or doe, wheezing, orthopnea, PND,  palpitations, dizziness or syncope.  Has also had right knee pain/swelling new for him but without trauma, fever, giveaway or falls, pain mild to mod.  Pt worried about DVT, family member had this.    Pt denies fever, wt loss, night sweats, loss of appetite, or other constitutional symptoms.  Denies worsening depressive symptoms, suicidal ideation, or panic,.  On Xarelto for almost 1 yr  No overt bleeding or bruising Past Medical History  Diagnosis Date  . Hypertension   . Hyperlipidemia   . BPH (benign prostatic hyperplasia)   . Diverticulosis of colon (without mention of hemorrhage)   . Esophageal reflux   . Abdominal pain, epigastric   . Depressive disorder, not elsewhere classified   . Lumbago   . Allergic rhinitis, cause unspecified   . Sick sinus syndrome     s/p PPM Conservation officer, historic buildings) by [employer]  . Persistent atrial fibrillation   . Chronic anticoagulation   . High risk medication use   . Paralyzed hemidiaphragm     LEFT   Past Surgical History  Procedure Date  . Insert / replace / remove pacemaker 05/2009    reports that he quit smoking about 33 years ago. His smoking use included Cigarettes. He has a 12.5 pack-year smoking history. He does not have any smokeless tobacco history on file. He reports that he does not drink alcohol or use illicit drugs. family history includes Arthritis in his paternal grandmother; Asthma in his sister; Hypertension in his father; and Lung cancer in his father. No Known  Allergies Current Outpatient Prescriptions on File Prior to Visit  Medication Sig Dispense Refill  . acetaminophen-codeine (TYLENOL #3) 300-30 MG per tablet Take 1 tablet by mouth 3 (three) times daily as needed.  90 tablet  2  . amLODipine (NORVASC) 10 MG tablet Take 1 tablet (10 mg total) by mouth daily.  90 tablet  3  . atorvastatin (LIPITOR) 40 MG tablet Take 1 tablet (40 mg total) by mouth daily.  90 tablet  3  . cetirizine (ZYRTEC) 10 MG tablet Take 1 tablet (10 mg total) by mouth daily.  90 tablet  3  . flecainide (TAMBOCOR) 100 MG tablet Take 1 tablet (100 mg total) by mouth 2 (two) times daily.  180 tablet  3  . fluticasone (FLONASE) 50 MCG/ACT nasal spray Place 2 sprays into the nose daily.  16 g  2  . gabapentin (NEURONTIN) 800 MG tablet Take 1 tablet (800 mg total) by mouth 3 (three) times daily.  270 tablet  1  . lisinopril (PRINIVIL,ZESTRIL) 10 MG tablet Take 1 tablet (10 mg total) by mouth daily.  90 tablet  3  . omeprazole (PRILOSEC) 20 MG capsule Take 1 capsule (20 mg total) by mouth daily.  90 capsule  3  . oxybutynin (DITROPAN-XL) 10 MG 24 hr tablet Take 1 tablet (10 mg total) by mouth daily.  90 tablet  3  . Rivaroxaban (XARELTO) 20 MG TABS Take 20  mg by mouth daily.  90 tablet  3  . Tamsulosin HCl (FLOMAX) 0.4 MG CAPS Take 1 capsule (0.4 mg total) by mouth daily.  90 capsule  3  . vardenafil (LEVITRA) 20 MG tablet Take 1 tablet (20 mg total) by mouth daily as needed for erectile dysfunction.  10 tablet  11  . DISCONTD: furosemide (LASIX) 40 MG tablet Take 1 tablet (40 mg total) by mouth daily.  30 tablet  11   Review of Systems Constitutional: Negative for diaphoresis and unexpected weight change.  Eyes: Negative for photophobia and visual disturbance.  Respiratory: Negative for choking and stridor.   Gastrointestinal: Negative for vomiting and blood in stool.  Genitourinary: Negative for hematuria and decreased urine volume.  Skin: Negative for color change and wound.   Neurological: Negative for tremors and numbness.  Psychiatric/Behavioral: Negative for decreased concentration. The patient is not hyperactive.      Objective:   Physical Exam BP 110/64  Pulse 61  Temp(Src) 98.1 F (36.7 C) (Oral)  Ht 5\' 9"  (1.753 m)  Wt 208 lb 8 oz (94.575 kg)  BMI 30.79 kg/m2  SpO2 94% Physical Exam  VS noted, not ill appearing Constitutional: Pt appears well-developed and well-nourished.  HENT: Head: Normocephalic.  Right Ear: External ear normal.  Left Ear: External ear normal.  Eyes: Conjunctivae and EOM are normal. Pupils are equal, round, and reactive to light.  Neck: Normal range of motion. Neck supple.  Cardiovascular: Normal rate and regular rhythm.   Pulmonary/Chest: Effort normal and breath sounds normal.  Neurological: Pt is alert. Not confused Right knee with 1-2+ effusion, NT, FROM, mild crepitus Skin: Skin is warm. No erythema. 1-2+ edema to knees right, trace only on the left Psychiatric: Pt behavior is normal. Thought content normal. not depressed affect    Assessment & Plan:

## 2012-04-17 NOTE — Assessment & Plan Note (Signed)
stable overall by hx and exam, most recent data reviewed with pt, and pt to continue medical treatment as before Lab Results  Component Value Date   WBC 6.2 01/15/2012   HGB 14.0 01/15/2012   HCT 42.8 01/15/2012   PLT 193.0 01/15/2012   GLUCOSE 95 01/15/2012   CHOL 213* 12/24/2011   TRIG 70.0 12/24/2011   HDL 66.20 12/24/2011   LDLDIRECT 124.0 12/24/2011   LDLCALC 88 12/25/2009   ALT 24 12/24/2011   AST 29 12/24/2011   NA 139 01/15/2012   K 4.4 01/15/2012   CL 105 01/15/2012   CREATININE 1.0 01/15/2012   BUN 11 01/15/2012   CO2 27 01/15/2012   TSH 0.97 12/24/2011   PSA 1.48 12/24/2011   INR 1.3 06/02/2009    

## 2012-04-17 NOTE — Assessment & Plan Note (Signed)
stable overall by hx and exam, most recent data reviewed with pt, and pt to continue medical treatment as before BP Readings from Last 3 Encounters:  04/13/12 110/64  02/26/12 118/88  01/25/12 120/80

## 2012-04-17 NOTE — Assessment & Plan Note (Signed)
If doppler neg, would most likely be related to knee effusion and pain, will consider ortho eval

## 2012-04-17 NOTE — Assessment & Plan Note (Signed)
Etiology unclear, suspect may be related to the right knee effusion, but will need r/o dvt - for LE venous doppler

## 2012-04-26 DIAGNOSIS — M1710 Unilateral primary osteoarthritis, unspecified knee: Secondary | ICD-10-CM | POA: Diagnosis not present

## 2012-04-26 DIAGNOSIS — IMO0002 Reserved for concepts with insufficient information to code with codable children: Secondary | ICD-10-CM | POA: Diagnosis not present

## 2012-05-04 ENCOUNTER — Other Ambulatory Visit: Payer: Self-pay

## 2012-05-04 DIAGNOSIS — I83893 Varicose veins of bilateral lower extremities with other complications: Secondary | ICD-10-CM

## 2012-05-16 ENCOUNTER — Other Ambulatory Visit: Payer: Self-pay

## 2012-05-16 MED ORDER — ACETAMINOPHEN-CODEINE #3 300-30 MG PO TABS: 1.0000 | ORAL_TABLET | Freq: Three times a day (TID) | ORAL | Status: DC | PRN

## 2012-05-16 NOTE — Telephone Encounter (Signed)
Done hardcopy to robin  

## 2012-05-16 NOTE — Telephone Encounter (Signed)
Faxed hardcopy to pharmacy. 

## 2012-05-18 ENCOUNTER — Other Ambulatory Visit: Payer: Self-pay | Admitting: Internal Medicine

## 2012-05-23 ENCOUNTER — Encounter: Payer: Self-pay | Admitting: Internal Medicine

## 2012-05-23 ENCOUNTER — Ambulatory Visit (INDEPENDENT_AMBULATORY_CARE_PROVIDER_SITE_OTHER): Payer: Medicare Other | Admitting: Internal Medicine

## 2012-05-23 VITALS — BP 130/84 | HR 60 | Resp 18 | Ht 69.0 in | Wt 206.8 lb

## 2012-05-23 DIAGNOSIS — R0989 Other specified symptoms and signs involving the circulatory and respiratory systems: Secondary | ICD-10-CM

## 2012-05-23 DIAGNOSIS — I4891 Unspecified atrial fibrillation: Secondary | ICD-10-CM

## 2012-05-23 DIAGNOSIS — R0609 Other forms of dyspnea: Secondary | ICD-10-CM

## 2012-05-23 DIAGNOSIS — I495 Sick sinus syndrome: Secondary | ICD-10-CM | POA: Diagnosis not present

## 2012-05-23 DIAGNOSIS — I10 Essential (primary) hypertension: Secondary | ICD-10-CM | POA: Diagnosis not present

## 2012-05-23 LAB — PACEMAKER DEVICE OBSERVATION
AL AMPLITUDE: 0.75 mv
AL IMPEDENCE PM: 500 Ohm
ATRIAL PACING PM: 73
BAMS-0001: 170 {beats}/min
DEVICE MODEL PM: 764370
VENTRICULAR PACING PM: 1

## 2012-05-23 NOTE — Patient Instructions (Addendum)
Your physician wants you to follow-up in: 6 months with Norma Fredrickson, NP and Belenda Cruise and paula in the [facility].  You will receive a reminder letter in the mail two months in advance. If you don't receive a letter, please call our office to schedule the follow-up appointment.

## 2012-05-23 NOTE — Assessment & Plan Note (Signed)
Stable No change required today  Swelling is likely worsened by norvasc. Salt restriction is advised

## 2012-05-23 NOTE — Assessment & Plan Note (Signed)
Well controlled with flecainide Continue xarelto for stroke prevention

## 2012-05-23 NOTE — Progress Notes (Signed)
PCP: Oliver Barre, MD  [name] is a 71 y.o. male who presents today for routine electrophysiology followup.  Since last being seen in our clinic, the patient reports doing very well.  He has stable SOB due to paralyzed L hemidiaphragm.  He has been seen by Dr Delton Coombes. Today, he denies symptoms of palpitations, chest pain, dizziness, presyncope, or syncope.   He has stable R leg swelling.  The patient is otherwise without complaint today.   Past Medical History  Diagnosis Date  . Hypertension   . Hyperlipidemia   . BPH (benign prostatic hyperplasia)   . Diverticulosis of colon (without mention of hemorrhage)   . Esophageal reflux   . Abdominal pain, epigastric   . Depressive disorder, not elsewhere classified   . Lumbago   . Allergic rhinitis, cause unspecified   . Sick sinus syndrome     s/p PPM Conservation officer, historic buildings) by [employer]  . Persistent atrial fibrillation   . Chronic anticoagulation   . High risk medication use   . Paralyzed hemidiaphragm     LEFT   Past Surgical History  Procedure Date  . Insert / replace / remove pacemaker 05/2009    Current Outpatient Prescriptions  Medication Sig Dispense Refill  . acetaminophen-codeine (TYLENOL #3) 300-30 MG per tablet Take 1 tablet by mouth 3 (three) times daily as needed.  90 tablet  2  . amLODipine (NORVASC) 10 MG tablet Take 1 tablet (10 mg total) by mouth daily.  90 tablet  3  . atorvastatin (LIPITOR) 40 MG tablet Take 1 tablet (40 mg total) by mouth daily.  90 tablet  3  . cetirizine (ZYRTEC) 10 MG tablet Take 1 tablet (10 mg total) by mouth daily.  90 tablet  3  . flecainide (TAMBOCOR) 100 MG tablet Take 1 tablet (100 mg total) by mouth 2 (two) times daily.  180 tablet  3  . fluticasone (FLONASE) 50 MCG/ACT nasal spray Place 2 sprays into the nose daily.  16 g  2  . gabapentin (NEURONTIN) 800 MG tablet Take 1 tablet (800 mg total) by mouth 3 (three) times daily.  270 tablet  1  . latanoprost (XALATAN) 0.005 % ophthalmic solution         . lisinopril (PRINIVIL,ZESTRIL) 10 MG tablet Take 1 tablet (10 mg total) by mouth daily.  90 tablet  3  . omeprazole (PRILOSEC) 20 MG capsule TAKE ONE CAPSULE BY MOUTH EVERY DAY  90 capsule  3  . oxybutynin (DITROPAN-XL) 10 MG 24 hr tablet Take 1 tablet (10 mg total) by mouth daily.  90 tablet  3  . Rivaroxaban (XARELTO) 20 MG TABS Take 20 mg by mouth daily.  90 tablet  3  . Tamsulosin HCl (FLOMAX) 0.4 MG CAPS Take 1 capsule (0.4 mg total) by mouth daily.  90 capsule  3  . vardenafil (LEVITRA) 20 MG tablet Take 1 tablet (20 mg total) by mouth daily as needed for erectile dysfunction.  10 tablet  11  . DISCONTD: furosemide (LASIX) 40 MG tablet Take 1 tablet (40 mg total) by mouth daily.  30 tablet  11    Physical Exam: Filed Vitals:   05/23/12 0945  BP: 130/84  Pulse: 60  Resp: 18  Height: 5\' 9"  (1.753 m)  Weight: 206 lb 12.8 oz (93.804 kg)  SpO2: 96%    GEN- The patient is well appearing, alert and oriented x 3 today.   Head- normocephalic, atraumatic Eyes-  Sclera clear, conjunctiva pink Ears- hearing  intact Oropharynx- clear Lungs- Clear to ausculation bilaterally, normal work of breathing Chest- pacemaker pocket is well healed Heart- Regular rate and rhythm, no murmurs, rubs or gallops, PMI not laterally displaced GI- soft, NT, ND, + BS Extremities- no clubbing, cyanosis, trace edema in R leg  Pacemaker interrogation- reviewed in detail today,  See PACEART report  Assessment and Plan:

## 2012-05-23 NOTE — Assessment & Plan Note (Signed)
Stable Regular exercise and follow-up with Dr Delton Coombes is encouraged

## 2012-05-23 NOTE — Assessment & Plan Note (Signed)
Normal pacemaker function See Pace Art report No changes today  

## 2012-05-24 DIAGNOSIS — H4011X0 Primary open-angle glaucoma, stage unspecified: Secondary | ICD-10-CM | POA: Diagnosis not present

## 2012-05-24 DIAGNOSIS — H409 Unspecified glaucoma: Secondary | ICD-10-CM | POA: Diagnosis not present

## 2012-05-24 DIAGNOSIS — H2510 Age-related nuclear cataract, unspecified eye: Secondary | ICD-10-CM | POA: Diagnosis not present

## 2012-06-14 ENCOUNTER — Telehealth: Payer: Self-pay | Admitting: Internal Medicine

## 2012-06-14 NOTE — Telephone Encounter (Signed)
New msg Pt called and said xarelto is expensive and the discount coupon he received had expired. Please call

## 2012-06-14 NOTE — Telephone Encounter (Signed)
He had a Coupon that last for 1 year.  The cost was $10  Now it cost him $110 for 30 pills This is too expensive and wants to know what else he can take

## 2012-06-15 NOTE — Telephone Encounter (Signed)
Dr Johney Frame discussed with rep and they can re-enroll in program  Need to call [phone] and they will walk patient through  I left message on the phone with the above for patient

## 2012-06-18 IMAGING — RF DG FLUORO RM 1-60 MIN
1 series · 1 of 1 positions shown · non-contrast
Comparison: Chest x-ray of [DATE] - - - fluoroscopy time of 0.4-
minute

CLINICAL DATA: Elevation of the left hemidiaphragm on chest x-ray

FLOURO RM 1-60 MIN

[Series 1: run · 1 of 1 slices shown]
[im 1/1]
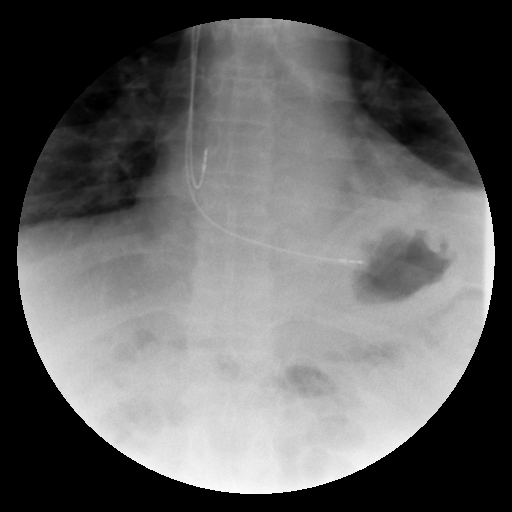

[1 of 1 positions shown; findings below may reference images not displayed]

FINDINGS: Using the sniff test as well as deep inspiration and deep
expiration, there is paradoxical motion of the left hemidiaphragm.
Permanent pacemaker wires are noted.
IMPRESSION: Paradoxical motion of the left hemidiaphragm.

## 2012-06-22 ENCOUNTER — Ambulatory Visit (INDEPENDENT_AMBULATORY_CARE_PROVIDER_SITE_OTHER): Payer: Medicare Other | Admitting: Internal Medicine

## 2012-06-22 ENCOUNTER — Encounter: Payer: Self-pay | Admitting: Internal Medicine

## 2012-06-22 VITALS — BP 112/72 | HR 60 | Temp 98.2°F | Ht 69.0 in | Wt 203.2 lb

## 2012-06-22 DIAGNOSIS — F3289 Other specified depressive episodes: Secondary | ICD-10-CM | POA: Diagnosis not present

## 2012-06-22 DIAGNOSIS — I10 Essential (primary) hypertension: Secondary | ICD-10-CM

## 2012-06-22 DIAGNOSIS — J309 Allergic rhinitis, unspecified: Secondary | ICD-10-CM | POA: Diagnosis not present

## 2012-06-22 DIAGNOSIS — F329 Major depressive disorder, single episode, unspecified: Secondary | ICD-10-CM | POA: Diagnosis not present

## 2012-06-22 DIAGNOSIS — E785 Hyperlipidemia, unspecified: Secondary | ICD-10-CM

## 2012-06-22 DIAGNOSIS — M545 Low back pain, unspecified: Secondary | ICD-10-CM

## 2012-06-22 MED ORDER — FEXOFENADINE HCL 180 MG PO TABS: 180.0000 mg | ORAL_TABLET | Freq: Every day | ORAL | Status: DC

## 2012-06-22 NOTE — Assessment & Plan Note (Signed)
Ok to take tylenol arthriits instead of the tylenol #3 due to sleepiness

## 2012-06-22 NOTE — Progress Notes (Signed)
Subjective:    Patient ID: [name], male    DOB: 1940/12/02, 71 y.o.   MRN: [id number]  HPI  Here to f/u, was found to have nonfunctional left hemidiaphram mar 2013; Pt denies chest pain, increased sob or doe, wheezing, orthopnea, PND, increased LE swelling, palpitations, dizziness or syncope. Pt denies new neurological symptoms such as new headache, or facial or extremity weakness or numbness   Pt denies polydipsia, polyuria.  Wants to change tylenol #3 and zyrtec due to drowsiness.  Takes alleve once daily on PPI as well.   Pt denies fever, wt loss, night sweats, loss of appetite, or other constitutional symptoms  Denies worsening depressive symptoms, suicidal ideation, or panic.  Does have several wks ongoing nasal allergy symptoms with clear congestion, itch and sneeze, without fever, pain, ST, cough or wheezing. Past Medical History  Diagnosis Date  . Hypertension   . Hyperlipidemia   . BPH (benign prostatic hyperplasia)   . Diverticulosis of colon (without mention of hemorrhage)   . Esophageal reflux   . Abdominal pain, epigastric   . Depressive disorder, not elsewhere classified   . Lumbago   . Allergic rhinitis, cause unspecified   . Sick sinus syndrome     s/p PPM Conservation officer, historic buildings) by [employer]  . Persistent atrial fibrillation   . Chronic anticoagulation   . High risk medication use   . Paralyzed hemidiaphragm     LEFT   Past Surgical History  Procedure Date  . Insert / replace / remove pacemaker 05/2009    reports that he quit smoking about 33 years ago. His smoking use included Cigarettes. He has a 12.5 pack-year smoking history. He does not have any smokeless tobacco history on file. He reports that he does not drink alcohol or use illicit drugs. family history includes Arthritis in his paternal grandmother; Asthma in his sister; Hypertension in his father; and Lung cancer in his father. No Known Allergies  Current Outpatient Prescriptions on File Prior to Visit    Medication Sig Dispense Refill  . acetaminophen-codeine (TYLENOL #3) 300-30 MG per tablet Take 1 tablet by mouth 3 (three) times daily as needed.  90 tablet  2  . amLODipine (NORVASC) 10 MG tablet Take 1 tablet (10 mg total) by mouth daily.  90 tablet  3  . atorvastatin (LIPITOR) 40 MG tablet Take 1 tablet (40 mg total) by mouth daily.  90 tablet  3  . cetirizine (ZYRTEC) 10 MG tablet Take 1 tablet (10 mg total) by mouth daily.  90 tablet  3  . flecainide (TAMBOCOR) 100 MG tablet Take 1 tablet (100 mg total) by mouth 2 (two) times daily.  180 tablet  3  . fluticasone (FLONASE) 50 MCG/ACT nasal spray Place 2 sprays into the nose daily.  16 g  2  . gabapentin (NEURONTIN) 800 MG tablet Take 1 tablet (800 mg total) by mouth 3 (three) times daily.  270 tablet  1  . latanoprost (XALATAN) 0.005 % ophthalmic solution       . lisinopril (PRINIVIL,ZESTRIL) 10 MG tablet Take 1 tablet (10 mg total) by mouth daily.  90 tablet  3  . omeprazole (PRILOSEC) 20 MG capsule TAKE ONE CAPSULE BY MOUTH EVERY DAY  90 capsule  3  . oxybutynin (DITROPAN-XL) 10 MG 24 hr tablet Take 1 tablet (10 mg total) by mouth daily.  90 tablet  3  . Rivaroxaban (XARELTO) 20 MG TABS Take 20 mg by mouth daily.  90 tablet  3  .  Tamsulosin HCl (FLOMAX) 0.4 MG CAPS Take 1 capsule (0.4 mg total) by mouth daily.  90 capsule  3  . vardenafil (LEVITRA) 20 MG tablet Take 1 tablet (20 mg total) by mouth daily as needed for erectile dysfunction.  10 tablet  11  . DISCONTD: furosemide (LASIX) 40 MG tablet Take 1 tablet (40 mg total) by mouth daily.  30 tablet  11   Review of Systems Constitutional: Negative for diaphoresis and unexpected weight change.  HENT: Negative for drooling and tinnitus.   Eyes: Negative for photophobia and visual disturbance.  Respiratory: Negative for choking and stridor.   Gastrointestinal: Negative for vomiting and blood in stool.  Genitourinary: Negative for hematuria and decreased urine volume.  Musculoskeletal:  Negative for gait problem.  Skin: Negative for color change and wound.  Neurological: Negative for tremors and numbness.  Psychiatric/Behavioral: Negative for decreased concentration. The patient is not hyperactive.      Objective:   Physical Exam BP 112/72  Pulse 60  Temp 98.2 F (36.8 C) (Oral)  Ht 5\' 9"  (1.753 m)  Wt 203 lb 4 oz (92.194 kg)  BMI 30.01 kg/m2  SpO2 96% Physical Exam  VS noted Constitutional: Pt appears well-developed and well-nourished.  HENT: Head: Normocephalic.  Right Ear: External ear normal.  Left Ear: External ear normal. Bilat tm's mild erythema.  Sinus nontender.  Pharynx mild erythema  Eyes: Conjunctivae and EOM are normal. Pupils are equal, round, and reactive to light.  Neck: Normal range of motion. Neck supple.  Cardiovascular: Normal rate and regular rhythm.   Pulmonary/Chest: Effort normal and breath sounds normal.  Neurological: Pt is alert. Not confuesd Skin: Skin is warm. No erythema.  Psychiatric: Pt behavior is normal. Thought content normal.     Assessment & Plan:

## 2012-06-22 NOTE — Assessment & Plan Note (Signed)
stable overall by hx and exam, most recent data reviewed with pt, and pt to continue medical treatment as before BP Readings from Last 3 Encounters:  06/22/12 112/72  05/23/12 130/84  04/13/12 110/64

## 2012-06-22 NOTE — Assessment & Plan Note (Signed)
OK to change to allegra prn,  to f/u any worsening symptoms or concerns

## 2012-06-22 NOTE — Patient Instructions (Addendum)
Ok to stop the tylenol with codeine OK to take tylenol arthritis (OTC) - 1 - 2 every 8 hrs as needed for pain Take all new medications as prescribed - the allegra as needed for allergies (sent to the pharmacy) Continue all other medications as before Please have the pharmacy call with any refills you may need. No further blood work or tests need to be done today Please return in 6 months or sooner if needed

## 2012-06-22 NOTE — Assessment & Plan Note (Signed)
stable overall by hx and exam, most recent data reviewed with pt, and pt to continue medical treatment as before Lab Results  Component Value Date   LDLCALC 88 12/25/2009   Cont diet

## 2012-06-22 NOTE — Assessment & Plan Note (Signed)
stable overall by hx and exam, most recent data reviewed with pt, and pt to continue medical treatment as before Lab Results  Component Value Date   WBC 6.2 01/15/2012   HGB 14.0 01/15/2012   HCT 42.8 01/15/2012   PLT 193.0 01/15/2012   GLUCOSE 95 01/15/2012   CHOL 213* 12/24/2011   TRIG 70.0 12/24/2011   HDL 66.20 12/24/2011   LDLDIRECT 124.0 12/24/2011   LDLCALC 88 12/25/2009   ALT 24 12/24/2011   AST 29 12/24/2011   NA 139 01/15/2012   K 4.4 01/15/2012   CL 105 01/15/2012   CREATININE 1.0 01/15/2012   BUN 11 01/15/2012   CO2 27 01/15/2012   TSH 0.97 12/24/2011   PSA 1.48 12/24/2011   INR 1.3 06/02/2009

## 2012-07-12 ENCOUNTER — Encounter: Payer: Self-pay | Admitting: Vascular Surgery

## 2012-07-13 ENCOUNTER — Encounter: Payer: Self-pay | Admitting: Vascular Surgery

## 2012-07-13 ENCOUNTER — Ambulatory Visit (INDEPENDENT_AMBULATORY_CARE_PROVIDER_SITE_OTHER): Payer: Medicare Other | Admitting: Vascular Surgery

## 2012-07-13 ENCOUNTER — Encounter (INDEPENDENT_AMBULATORY_CARE_PROVIDER_SITE_OTHER): Payer: Medicare Other | Admitting: *Deleted

## 2012-07-13 VITALS — BP 134/90 | HR 60 | Resp 16 | Ht 69.0 in | Wt 193.0 lb

## 2012-07-13 DIAGNOSIS — I8390 Asymptomatic varicose veins of unspecified lower extremity: Secondary | ICD-10-CM | POA: Insufficient documentation

## 2012-07-13 DIAGNOSIS — I83893 Varicose veins of bilateral lower extremities with other complications: Secondary | ICD-10-CM

## 2012-07-13 NOTE — Progress Notes (Signed)
[facility] [place]  Patient name: [name] MRN: [id number] DOB: 1940/12/11 Sex: male  REASON FOR CONSULT: bilateral leg swelling  HPI: [name] is a 71 y.o. male who states that for approximately 2 months he's noted increasing swelling in both lower extremities but more significantly on the right side. This was gradual in onset. His symptoms are worse with standing and relieved with elevation. He's had no significant leg pain. He's had no previous history of DVT or phlebitis. I do not get any history of claudication or rest pain. He's had no previous abdominal or lower extremity radiation therapy or surgery.  Past Medical History  Diagnosis Date  . Hypertension   . Hyperlipidemia   . BPH (benign prostatic hyperplasia)   . Diverticulosis of colon (without mention of hemorrhage)   . Esophageal reflux   . Abdominal pain, epigastric   . Depressive disorder, not elsewhere classified   . Lumbago   . Allergic rhinitis, cause unspecified   . Sick sinus syndrome     s/p PPM Conservation officer, historic buildings) by [employer]  . Persistent atrial fibrillation   . Chronic anticoagulation   . High risk medication use   . Paralyzed hemidiaphragm     LEFT    Family History  Problem Relation Age of Onset  . Hypertension Father   . Lung cancer Father   . Cancer Father   . Asthma Sister   . Hypertension Sister   . Arthritis Paternal Grandmother   . Diabetes Mother   . Hypertension Mother   . Hypertension Brother   . Hypertension Daughter   . Hypertension Son     SOCIAL HISTORY: History  Substance Use Topics  . Smoking status: Former Smoker -- 0.5 packs/day for 25 years    Types: Cigarettes    Quit date: 11/16/1978  . Smokeless tobacco: Former Neurosurgeon    Types: Chew  . Alcohol Use: No    No Known Allergies  Current Outpatient Prescriptions  Medication Sig Dispense Refill  . amLODipine (NORVASC) 10 MG tablet Take 1 tablet (10 mg total) by mouth daily.  90 tablet  3  .  atorvastatin (LIPITOR) 40 MG tablet Take 1 tablet (40 mg total) by mouth daily.  90 tablet  3  . cetirizine (ZYRTEC) 10 MG tablet Take 1 tablet (10 mg total) by mouth daily.  90 tablet  3  . fexofenadine (ALLEGRA) 180 MG tablet Take 1 tablet (180 mg total) by mouth daily.  90 tablet  3  . flecainide (TAMBOCOR) 100 MG tablet Take 1 tablet (100 mg total) by mouth 2 (two) times daily.  180 tablet  3  . fluticasone (FLONASE) 50 MCG/ACT nasal spray Place 2 sprays into the nose daily.  16 g  2  . gabapentin (NEURONTIN) 800 MG tablet Take 1 tablet (800 mg total) by mouth 3 (three) times daily.  270 tablet  1  . latanoprost (XALATAN) 0.005 % ophthalmic solution       . lisinopril (PRINIVIL,ZESTRIL) 10 MG tablet Take 1 tablet (10 mg total) by mouth daily.  90 tablet  3  . omeprazole (PRILOSEC) 20 MG capsule TAKE ONE CAPSULE BY MOUTH EVERY DAY  90 capsule  3  . oxybutynin (DITROPAN-XL) 10 MG 24 hr tablet Take 1 tablet (10 mg total) by mouth daily.  90 tablet  3  . Rivaroxaban (XARELTO) 20 MG TABS Take 20 mg by mouth daily.  90 tablet  3  . Tamsulosin HCl (FLOMAX) 0.4 MG  CAPS Take 1 capsule (0.4 mg total) by mouth daily.  90 capsule  3  . vardenafil (LEVITRA) 20 MG tablet Take 1 tablet (20 mg total) by mouth daily as needed for erectile dysfunction.  10 tablet  11  . DISCONTD: furosemide (LASIX) 40 MG tablet Take 1 tablet (40 mg total) by mouth daily.  30 tablet  11    REVIEW OF SYSTEMS: Arly.[email or web address] ] denotes positive finding; [  ] denotes negative finding  CARDIOVASCULAR:  [ ]  chest pain   [ ]  chest pressure   [ ]  palpitations   [ ]  orthopnea   Arly.[email or web address] ] dyspnea on exertion   [ ]  claudication   [ ]  rest pain   [ ]  DVT   [ ]  phlebitis PULMONARY:   [ ]  productive cough   [ ]  asthma   Arly.[email or web address] ] wheezing NEUROLOGIC:   [ ]  weakness  [ ]  paresthesias  [ ]  aphasia  [ ]  amaurosis  [ ]  dizziness HEMATOLOGIC:   [ ]  bleeding problems   [ ]  clotting disorders MUSCULOSKELETAL:  [ ]  joint pain   [ ]  joint swelling Arly.[email or web address] ] leg  swelling GASTROINTESTINAL: [ ]   blood in stool  [ ]   hematemesis GENITOURINARY:  [ ]   dysuria  [ ]   hematuria PSYCHIATRIC:  [ ]  history of major depression INTEGUMENTARY:  [ ]  rashes  [ ]  ulcers CONSTITUTIONAL:  [ ]  fever   [ ]  chills  PHYSICAL EXAM: Filed Vitals:   07/13/12 1530  BP: 134/90  Pulse: 60  Resp: 16  Height: 5\' 9"  (1.753 m)  Weight: 193 lb (87.544 kg)  SpO2: 100%   Body mass index is 28.50 kg/(m^2). GENERAL: The patient is a well-nourished male, in no acute distress. The vital signs are documented above. CARDIOVASCULAR: There is a regular rate and rhythm without significant murmur appreciated. I do not detect carotid bruits. He has palpable femoral, popliteal, and posterior tibial pulses bilaterally. PULMONARY: There is good air exchange bilaterally without wheezing or rales. ABDOMEN: Soft and non-tender with normal pitched bowel sounds.  MUSCULOSKELETAL: There are no major deformities or cyanosis. NEUROLOGIC: No focal weakness or paresthesias are detected. SKIN: There are no ulcers or rashes noted. PSYCHIATRIC: The patient has a normal affect.  DATA:  I have independently interpreted his venous duplex scan. This shows no evidence of DVT in the right lower extremity. He has no evidence of reflux in the greater saphenous vein on the right or the deep system on the right.  I have reviewed his [facility] orthopedics. He has osteoarthritis of the right knee.  MEDICAL ISSUES: This patient has mild bilateral lower extremity swelling in may have some mild lymphedema. However I reassured him that he has no evidence of DVT or significant chronic venous insufficiency. Have discussed the importance of intermittent leg elevation and the proper positioning for this. In addition I have written him a prescription for a mild compression stocking with only a 15-18 mm mercury gradient as the patient has had problems in the past with compression stockings into difficult to get  on and off so I suspect they were too tight. I also reassured him that he has excellent arterial flow and no evidence of arterial insufficiency. I be happy to see him back at any time if any new vascular issues arise.   [place] [facility] [place] Beeper: [phone]

## 2012-07-15 ENCOUNTER — Other Ambulatory Visit: Payer: Self-pay | Admitting: Internal Medicine

## 2012-08-01 ENCOUNTER — Other Ambulatory Visit: Payer: Self-pay | Admitting: Internal Medicine

## 2012-08-01 NOTE — Telephone Encounter (Signed)
Done hardcopy to robin  

## 2012-08-23 DIAGNOSIS — Z23 Encounter for immunization: Secondary | ICD-10-CM | POA: Diagnosis not present

## 2012-08-23 DIAGNOSIS — L259 Unspecified contact dermatitis, unspecified cause: Secondary | ICD-10-CM | POA: Diagnosis not present

## 2012-08-23 DIAGNOSIS — B360 Pityriasis versicolor: Secondary | ICD-10-CM | POA: Diagnosis not present

## 2012-11-22 ENCOUNTER — Ambulatory Visit (INDEPENDENT_AMBULATORY_CARE_PROVIDER_SITE_OTHER): Payer: Medicare Other | Admitting: Cardiology

## 2012-11-22 ENCOUNTER — Encounter: Payer: Self-pay | Admitting: Internal Medicine

## 2012-11-22 ENCOUNTER — Encounter: Payer: Self-pay | Admitting: Cardiology

## 2012-11-22 VITALS — BP 136/82 | HR 69 | Resp 18 | Ht 69.0 in | Wt 218.0 lb

## 2012-11-22 DIAGNOSIS — Z950 Presence of cardiac pacemaker: Secondary | ICD-10-CM

## 2012-11-22 DIAGNOSIS — I495 Sick sinus syndrome: Secondary | ICD-10-CM

## 2012-11-22 DIAGNOSIS — I4891 Unspecified atrial fibrillation: Secondary | ICD-10-CM

## 2012-11-22 LAB — PACEMAKER DEVICE OBSERVATION
AL AMPLITUDE: 0.75 mv
ATRIAL PACING PM: 68
BAMS-0001: 170 {beats}/min
BAMS-0002: 8 ms
DEVICE MODEL PM: 764370
RV LEAD AMPLITUDE: 7.5 mv
RV LEAD THRESHOLD: 1.2 V

## 2012-11-22 NOTE — Patient Instructions (Signed)
Your physician wants you to follow-up in: 6 months with Dr. Allred. You will receive a reminder letter in the mail two months in advance. If you don't receive a letter, please call our office to schedule the follow-up appointment.  

## 2012-11-22 NOTE — Progress Notes (Signed)
ELECTROPHYSIOLOGY OFFICE NOTE  Patient ID: [name] MRN: [id number], DOB/AGE: [birth year]   Date of Visit: 11/22/2012  Primary Physician: Oliver Barre, MD Primary EP: Hillis Range, MD Reason for Visit: EP/device follow-up  History of Present Illness  [name] is a pleasant 72 year old gentleman with SSS s/p PPM and paroxysmal AFib who presents today for routine electrophysiology followup. Since last being seen in our clinic, he reports doing well from a cardiac standpoint. He continues to have DOE that varies depending on activity; however, he states it is not worsening. He has been evaluated by Dr. Delton Coombes who felt his symptoms were most likely related to paralyzed left hemidiaphragm. He has noticed increased head congestion and feeling like he needs to clear his throat frequently when lying supine on his left side. He has no other complaints. Of note, he underwent a Lexiscan Myoview in October 2012 which was negative for ischemia. He also had an echo October 2012 which revealed normal LV function and no significant valvular abnormalities. Today, he denies symptoms of palpitations, chest pain, dyspnea at rest, orthopnea, PND, lower extremity edema, dizziness, near syncope or syncope.    Past Medical History Past Medical History  Diagnosis Date  . Hypertension   . Hyperlipidemia   . BPH (benign prostatic hyperplasia)   . Diverticulosis of colon (without mention of hemorrhage)   . Esophageal reflux   . Abdominal pain, epigastric   . Depressive disorder, not elsewhere classified   . Lumbago   . Allergic rhinitis, cause unspecified   . Sick sinus syndrome     s/p PPM Conservation officer, historic buildings) by [employer]  . Persistent atrial fibrillation   . Chronic anticoagulation   . High risk medication use   . Paralyzed hemidiaphragm     LEFT    Past Surgical History Past Surgical History  Procedure Date  . Insert / replace / remove pacemaker 05/2009     Allergies/Intolerances No Known Allergies  Current  Home Medications Current Outpatient Prescriptions  Medication Sig Dispense Refill  . amLODipine (NORVASC) 10 MG tablet Take 1 tablet (10 mg total) by mouth daily.  90 tablet  3  . atorvastatin (LIPITOR) 40 MG tablet Take 1 tablet (40 mg total) by mouth daily.  90 tablet  3  . cetirizine (ZYRTEC) 10 MG tablet Take 1 tablet (10 mg total) by mouth daily.  90 tablet  3  . fexofenadine (ALLEGRA) 180 MG tablet Take 1 tablet (180 mg total) by mouth daily.  90 tablet  3  . flecainide (TAMBOCOR) 100 MG tablet Take 1 tablet (100 mg total) by mouth 2 (two) times daily.  180 tablet  3  . fluticasone (FLONASE) 50 MCG/ACT nasal spray PLACE 2 SPRAYS INTO THE NOSE DAILY.  16 g  11  . gabapentin (NEURONTIN) 800 MG tablet TAKE 1 TABLET (800 MG TOTAL) BY MOUTH 3 (THREE) TIMES DAILY.  90 tablet  5  . latanoprost (XALATAN) 0.005 % ophthalmic solution       . lisinopril (PRINIVIL,ZESTRIL) 10 MG tablet Take 1 tablet (10 mg total) by mouth daily.  90 tablet  3  . omeprazole (PRILOSEC) 20 MG capsule TAKE ONE CAPSULE BY MOUTH EVERY DAY  90 capsule  3  . oxybutynin (DITROPAN-XL) 10 MG 24 hr tablet Take 1 tablet (10 mg total) by mouth daily.  90 tablet  3  . Rivaroxaban (XARELTO) 20 MG TABS Take 20 mg by mouth daily.  90 tablet  3  . Tamsulosin HCl (FLOMAX) 0.4  MG CAPS Take 1 capsule (0.4 mg total) by mouth daily.  90 capsule  3  . vardenafil (LEVITRA) 20 MG tablet Take 1 tablet (20 mg total) by mouth daily as needed for erectile dysfunction.  10 tablet  11  . [DISCONTINUED] furosemide (LASIX) 40 MG tablet Take 1 tablet (40 mg total) by mouth daily.  30 tablet  11    Social History Social History  . Marital Status: Married   Occupational History  . retired    Social History Main Topics  . Smoking status: Former Smoker -- 0.5 packs/day for 25 years    Types: Cigarettes    Quit date: 11/16/1978  . Smokeless tobacco: Former Neurosurgeon    Types: Chew  . Alcohol Use: No  . Drug Use: No   Review of Systems General: No  chills, fever, night sweats or weight changes Cardiovascular: No chest pain, dyspnea on exertion, edema, orthopnea, palpitations, paroxysmal nocturnal dyspnea Dermatological: No rash, lesions or masses Respiratory: No cough, dyspnea Urologic: No hematuria, dysuria Abdominal: No nausea, vomiting, diarrhea, bright red blood per rectum, melena, or hematemesis Neurologic: No visual changes, weakness, changes in mental status All other systems reviewed and are otherwise negative except as noted above.  Physical Exam Blood pressure 136/82, pulse 69, resp. rate 18, height 5\' 9"  (1.753 m), weight 218 lb (98.884 kg), SpO2 93.00%.  General: Well developed, well appearing 72 year old male in no acute distress. HEENT: Normocephalic, atraumatic. EOMs intact. Sclera nonicteric. Oropharynx clear.  Neck: Supple. No JVD. Lungs: Respirations regular and unlabored, CTA bilaterally. No wheezes, rales or rhonchi. Heart: RRR. S1, S2 present. No murmurs, rub, S3 or S4. Abdomen: Soft, non-distended.  Extremities: No clubbing, cyanosis or edema. DP/PT/Radials 2+ and equal bilaterally. Psych: Normal affect. Neuro: Alert and oriented X 3. Moves all extremities spontaneously.   Diagnostics Device interrogation performed by me in clinic shows normal PPM function with good battery status and stable lead measurements/parameters; A paced 68%, V paced 1%; he is not pacer dependent; there were 6,200 ATR mode switch episodes with evidence of R wave oversensing on the atrial channel so atrial sensitivity was slightly decreased to 0.25 mV (was 0.15 mV), otherwise there were no other programming changes made; no VHR episodes; histograms appropriate; see PaceArt report   Assessment and Plan 1. Sinus node dysfunction s/p PPM Normal device function See PaceArt report Return for PPM check and follow-up with Dr. Johney Frame in 6 months 2. Paroxysmal AFib Continue flecainide as previously directed by Dr. Johney Frame Continue Xarelto for  embolic prophylaxis 3. Dyspnea on exertion Patient overdue for follow-up Scheduled follow-up for patient with Dr. Delton Coombes on 12/08/2012   Signed, Rick Duff, PA-C 11/22/2012, 4:22 PM

## 2012-12-02 DIAGNOSIS — H2510 Age-related nuclear cataract, unspecified eye: Secondary | ICD-10-CM | POA: Diagnosis not present

## 2012-12-02 DIAGNOSIS — H4011X0 Primary open-angle glaucoma, stage unspecified: Secondary | ICD-10-CM | POA: Diagnosis not present

## 2012-12-08 ENCOUNTER — Ambulatory Visit (INDEPENDENT_AMBULATORY_CARE_PROVIDER_SITE_OTHER): Payer: Medicare Other | Admitting: Emergency Medicine

## 2012-12-08 ENCOUNTER — Encounter: Payer: Self-pay | Admitting: Emergency Medicine

## 2012-12-08 VITALS — BP 122/78 | HR 75 | Temp 98.4°F | Ht 69.0 in | Wt 219.0 lb

## 2012-12-08 DIAGNOSIS — R0989 Other specified symptoms and signs involving the circulatory and respiratory systems: Secondary | ICD-10-CM

## 2012-12-08 DIAGNOSIS — R0609 Other forms of dyspnea: Secondary | ICD-10-CM | POA: Diagnosis not present

## 2012-12-08 DIAGNOSIS — J309 Allergic rhinitis, unspecified: Secondary | ICD-10-CM

## 2012-12-08 NOTE — Assessment & Plan Note (Signed)
Likely due to restriction. He did desaturate with heavy exertion, but does not want to pursue oxygen at this time - rov 12 months

## 2012-12-08 NOTE — Progress Notes (Signed)
  Subjective:    Patient ID: [name], male    DOB: 12-17-1940, 72 y.o.   MRN: [id number]  HPI 72 yo former smoker with HTN, hyperlipidemia, s/p pacer for sick sinus syndrome. He has been evaluated by Dr Johney Frame and Norma Fredrickson for dyspnea over the last year. The evaluation is most notable for paralyzed L hemidiaphragm (this wasn't present immediately after pacer was placed). Also with reassuring myoview stress test in 10/12, TTE w LVEF 50-55% in 10/12. He underwent PFT today 02/26/12 - spiro most consistent w restriction + possible superimposed obstruction. No BD response. Lung volumes confirm restriction. DLCO decreased but corrects for Va.  His SOB is w exertion, happens with certain activities. He is able to walk at slow pace, able to do some yard work, but trouble climbing stairs. He also gets SOB if he talks to much, bends over. He has experienced some cough, non-productive. He has wheeze with activity. Never tried a SABA to see if he benefits.  He had walking oximetry, said that he did not desat.   ROV 12/08/12 -- follow up for dyspnea. Has hx of SSS and pacer as above. Also with paralyzed R HD and restriction on PFT. Because there was some question of coexisting obstruction, we did a trial of albuterol >> didn't notice any change. His most bothersome complaint is some throat congestion, able to cough mucous out, worse after a meal. He does have nose and sinus congestion. He is on zyrtec, allegra, fluticasone qd - also on an ACE-I.      Objective:   Physical Exam Filed Vitals:   12/08/12 1451  BP: 122/78  Pulse: 75  Temp: 98.4 F (36.9 C)   Gen: Pleasant, well-nourished, in no distress,  normal affect  ENT: No lesions,  mouth clear,  oropharynx clear, no postnasal drip  Neck: No JVD, no TMG, no carotid bruits  Lungs: No use of accessory muscles, no dullness to percussion, decreased BS at L base, no wheezes  Cardiovascular: RRR, heart sounds normal, no murmur or gallops, trace edema     Musculoskeletal: No deformities, no cyanosis or clubbing  Neuro: alert, non focal  Skin: Warm, no lesions or rashes      Assessment & Plan:  ALLERGIC RHINITIS Please stop zyrtec and continue fexofenadine  Increase your fluticasone nose spray to 2 sprays each side twice a day.  Speak to Dr Jonny Ruiz and Dr Johney Frame about whether you might benefit from changing your lisinopril to an alternative blood pressure medication. This medicine can sometimes contribute to throat irritation.  Stop albuterol inhaler Follow with Dr Delton Coombes in 12 months or sooner if you have any problems   DOE (dyspnea on exertion) Likely due to restriction. He did desaturate with heavy exertion, but does not want to pursue oxygen at this time - rov 12 months

## 2012-12-08 NOTE — Patient Instructions (Addendum)
Please stop zyrtec and continue fexofenadine  Increase your fluticasone nose spray to 2 sprays each side twice a day.  Speak to Dr John and Dr Allred about whether you might benefit from changing your lisinopril to an alternative blood pressure medication. This medicine can sometimes contribute to throat irritation.  Stop albuterol inhaler Follow with Dr Neena Beecham in 12 months or sooner if you have any problems  

## 2012-12-08 NOTE — Assessment & Plan Note (Addendum)
Please stop zyrtec and continue fexofenadine  Increase your fluticasone nose spray to 2 sprays each side twice a day.  Speak to Dr Jonny Ruiz and Dr Johney Frame about whether you might benefit from changing your lisinopril to an alternative blood pressure medication. This medicine can sometimes contribute to throat irritation.  Stop albuterol inhaler Follow with Dr Delton Coombes in 12 months or sooner if you have any problems

## 2012-12-19 ENCOUNTER — Other Ambulatory Visit: Payer: Self-pay | Admitting: Internal Medicine

## 2012-12-26 ENCOUNTER — Other Ambulatory Visit (INDEPENDENT_AMBULATORY_CARE_PROVIDER_SITE_OTHER): Payer: Medicare Other

## 2012-12-26 ENCOUNTER — Encounter: Payer: Self-pay | Admitting: Internal Medicine

## 2012-12-26 ENCOUNTER — Ambulatory Visit (INDEPENDENT_AMBULATORY_CARE_PROVIDER_SITE_OTHER): Payer: Medicare Other | Admitting: Internal Medicine

## 2012-12-26 VITALS — BP 118/78 | HR 60 | Temp 98.2°F | Ht 69.0 in | Wt 216.0 lb

## 2012-12-26 DIAGNOSIS — M545 Low back pain, unspecified: Secondary | ICD-10-CM

## 2012-12-26 DIAGNOSIS — R5381 Other malaise: Secondary | ICD-10-CM

## 2012-12-26 DIAGNOSIS — N320 Bladder-neck obstruction: Secondary | ICD-10-CM | POA: Diagnosis not present

## 2012-12-26 DIAGNOSIS — E785 Hyperlipidemia, unspecified: Secondary | ICD-10-CM

## 2012-12-26 DIAGNOSIS — M79609 Pain in unspecified limb: Secondary | ICD-10-CM

## 2012-12-26 DIAGNOSIS — R5383 Other fatigue: Secondary | ICD-10-CM | POA: Insufficient documentation

## 2012-12-26 DIAGNOSIS — J309 Allergic rhinitis, unspecified: Secondary | ICD-10-CM

## 2012-12-26 DIAGNOSIS — I10 Essential (primary) hypertension: Secondary | ICD-10-CM

## 2012-12-26 DIAGNOSIS — M79676 Pain in unspecified toe(s): Secondary | ICD-10-CM | POA: Insufficient documentation

## 2012-12-26 DIAGNOSIS — I2510 Atherosclerotic heart disease of native coronary artery without angina pectoris: Secondary | ICD-10-CM

## 2012-12-26 HISTORY — DX: Atherosclerotic heart disease of native coronary artery without angina pectoris: I25.10

## 2012-12-26 LAB — HEPATIC FUNCTION PANEL
ALT: 30 U/L (ref 0–53)
AST: 24 U/L (ref 0–37)
Bilirubin, Direct: 0.1 mg/dL (ref 0.0–0.3)
Total Bilirubin: 1.1 mg/dL (ref 0.3–1.2)

## 2012-12-26 LAB — URINALYSIS, ROUTINE W REFLEX MICROSCOPIC
Bilirubin Urine: NEGATIVE
Hgb urine dipstick: NEGATIVE
Nitrite: NEGATIVE
Total Protein, Urine: NEGATIVE

## 2012-12-26 LAB — LIPID PANEL
Cholesterol: 156 mg/dL (ref 0–200)
LDL Cholesterol: 83 mg/dL (ref 0–99)
Total CHOL/HDL Ratio: 3

## 2012-12-26 LAB — CBC WITH DIFFERENTIAL/PLATELET
Eosinophils Absolute: 0.1 10*3/uL (ref 0.0–0.7)
MCHC: 32.5 g/dL (ref 30.0–36.0)
MCV: 89.7 fl (ref 78.0–100.0)
Monocytes Absolute: 0.8 10*3/uL (ref 0.1–1.0)
Neutrophils Relative %: 57.4 % (ref 43.0–77.0)
Platelets: 199 10*3/uL (ref 150.0–400.0)
RDW: 14.3 % (ref 11.5–14.6)

## 2012-12-26 LAB — TSH: TSH: 0.74 u[IU]/mL (ref 0.35–5.50)

## 2012-12-26 LAB — BASIC METABOLIC PANEL
Chloride: 103 mEq/L (ref 96–112)
Potassium: 4.5 mEq/L (ref 3.5–5.1)

## 2012-12-26 LAB — PSA: PSA: 0.97 ng/mL (ref 0.10–4.00)

## 2012-12-26 MED ORDER — LOSARTAN POTASSIUM 50 MG PO TABS: 50.0000 mg | ORAL_TABLET | Freq: Every day | ORAL | Status: DC

## 2012-12-26 MED ORDER — TRAMADOL HCL 50 MG PO TABS: 50.0000 mg | ORAL_TABLET | Freq: Four times a day (QID) | ORAL | Status: DC | PRN

## 2012-12-26 MED ORDER — FLUTICASONE PROPIONATE 50 MCG/ACT NA SUSP: 1.0000 | Freq: Two times a day (BID) | NASAL | Status: DC

## 2012-12-26 NOTE — Assessment & Plan Note (Signed)
stable overall by history and exam, recent data reviewed with pt, and pt to continue medical treatment as before,  to f/u any worsening symptoms or concerns Lab Results  Component Value Date   LDLCALC 88 12/25/2009   For f/u lab

## 2012-12-26 NOTE — Patient Instructions (Addendum)
Please take all new medication as prescribed - the tramadol for pain, losartan for blood pressure, and the flonase at twice per day Please continue all other medications as before, including the fexofenadine (allegra) OK to stay off the zyrtec Please continue your efforts at being more active, low cholesterol diet, and weight loss If the sleepiness does not improve, you may need to discuss with pulmonary the need for a sleep study You will be contacted regarding the referral for: podiatry, and orthopedic Thank you for enrolling in MyChart. Please follow the instructions below to securely access your online medical record. MyChart allows you to send messages to your doctor, view your test results, renew your prescriptions, schedule appointments, and more. To Log into My Chart online, please go by [employer] or [employer] to [email or web address], or download the MyChart App from the [employer] of [employer] .  Your Username is: [email or web address] (pass christian) Please send a practice Message on Mychart later today. Please return in 6 months, or sooner if needed

## 2012-12-26 NOTE — Assessment & Plan Note (Signed)
D/w pt, to try the allegra, and make flonase bid, but if sleepiness not improved, may need to return to pulm for ? OSA, for now to work on wt loss though difficult due to his LBP/leg pain

## 2012-12-26 NOTE — Assessment & Plan Note (Signed)
Etiology unclear, Exam otherwise benign, to check labs as documented, follow with expectant management  

## 2012-12-26 NOTE — Assessment & Plan Note (Signed)
Also for psa as he is due,  to f/u any worsening symptoms or concerns  

## 2012-12-26 NOTE — Assessment & Plan Note (Addendum)
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

## 2012-12-26 NOTE — Progress Notes (Signed)
Subjective:    Patient ID: [name], male    DOB: 10-Mar-1941, 72 y.o.   MRN: [id number]  HPI  Here to f/u; overall doing ok,  Pt denies chest pain, increased sob or doe, wheezing, orthopnea, PND, increased LE swelling, palpitations, dizziness or syncope.  Pt denies polydipsia, polyuria, or low sugar symptoms such as weakness or confusion improved with po intake.  Pt denies new neurological symptoms such as new headache, or facial or extremity weakness or numbness.   Pt states overall good compliance with meds, has been trying to follow lower cholesterol, diabetic diet, with wt overall stable,  but little exercise however.  Does c/o ongoing fatigue, and has signficant daytime hypersomnolence,not sure if he snores.. Is at lifetime peak wt, slowed down recently with ? arthritic complaints and pain mostly to bilat legs from just above the knees and more distal, forces himself to treadmill 3 times weekly, No pain increase with walking, but worse pain to legs on sitting or lying, some better to lie with pillow to the lower back.  Pt continues to have recurring LBP "every other day" without change in severity, bowel or bladder change, fever, wt loss,  worsening LE numbness/weakness, gait change or falls. Also mentioned per pulm regarding possible change of ACE to alternative due to "throat irritation."  Zyrtec stopped, allegra cont'd, but sleepiness not improved.   Past Medical History  Diagnosis Date  . Hypertension   . Hyperlipidemia   . BPH (benign prostatic hyperplasia)   . Diverticulosis of colon (without mention of hemorrhage)   . Esophageal reflux   . Abdominal pain, epigastric   . Depressive disorder, not elsewhere classified   . Lumbago   . Allergic rhinitis, cause unspecified   . Sick sinus syndrome     s/p PPM Conservation officer, historic buildings) by [employer]  . Persistent atrial fibrillation   . Chronic anticoagulation   . High risk medication use   . Paralyzed hemidiaphragm     LEFT  . Coronary artery  calcification seen on CAT scan 12/26/2012   Past Surgical History  Procedure Laterality Date  . Insert / replace / remove pacemaker  05/2009    reports that he quit smoking about 34 years ago. His smoking use included Cigarettes. He has a 12.5 pack-year smoking history. He has quit using smokeless tobacco. His smokeless tobacco use included Chew. He reports that he does not drink alcohol or use illicit drugs. family history includes Arthritis in his paternal grandmother; Asthma in his sister; Cancer in his father; Diabetes in his mother; Hypertension in his brother, daughter, father, mother, sister, and son; and Lung cancer in his father. No Known Allergies Current Outpatient Prescriptions on File Prior to Visit  Medication Sig Dispense Refill  . amLODipine (NORVASC) 10 MG tablet Take 1 tablet (10 mg total) by mouth daily.  90 tablet  3  . atorvastatin (LIPITOR) 40 MG tablet TAKE 1 TABLET (40 MG TOTAL) BY MOUTH DAILY.  90 tablet  2  . fexofenadine (ALLEGRA) 180 MG tablet Take 1 tablet (180 mg total) by mouth daily.  90 tablet  3  . fluticasone (FLONASE) 50 MCG/ACT nasal spray PLACE 2 SPRAYS INTO THE NOSE DAILY.  16 g  11  . gabapentin (NEURONTIN) 800 MG tablet TAKE 1 TABLET (800 MG TOTAL) BY MOUTH 3 (THREE) TIMES DAILY.  90 tablet  5  . latanoprost (XALATAN) 0.005 % ophthalmic solution       . omeprazole (PRILOSEC) 20 MG capsule TAKE ONE CAPSULE  BY MOUTH EVERY DAY  90 capsule  3  . oxybutynin (DITROPAN-XL) 10 MG 24 hr tablet TAKE 1 TABLET (10 MG TOTAL) BY MOUTH DAILY.  90 tablet  2  . Rivaroxaban (XARELTO) 20 MG TABS Take 20 mg by mouth daily.  90 tablet  3  . Tamsulosin HCl (FLOMAX) 0.4 MG CAPS TAKE 1 CAPSULE (0.4 MG TOTAL) BY MOUTH DAILY.  90 capsule  2  . cetirizine (ZYRTEC) 10 MG tablet Take 1 tablet (10 mg total) by mouth daily.  90 tablet  3  . flecainide (TAMBOCOR) 100 MG tablet Take 1 tablet (100 mg total) by mouth 2 (two) times daily.  180 tablet  3  . lisinopril (PRINIVIL,ZESTRIL) 10  MG tablet Take 1 tablet (10 mg total) by mouth daily.  90 tablet  3  . vardenafil (LEVITRA) 20 MG tablet Take 1 tablet (20 mg total) by mouth daily as needed for erectile dysfunction.  10 tablet  11  . [DISCONTINUED] furosemide (LASIX) 40 MG tablet Take 1 tablet (40 mg total) by mouth daily.  30 tablet  11  . [DISCONTINUED] oxybutynin (DITROPAN-XL) 10 MG 24 hr tablet Take 1 tablet (10 mg total) by mouth daily.  90 tablet  3   No current facility-administered medications on file prior to visit.   Review of Systems  Constitutional: Negative for unexpected weight change, or unusual diaphoresis  HENT: Negative for tinnitus.   Eyes: Negative for photophobia and visual disturbance.  Respiratory: Negative for choking and stridor.   Gastrointestinal: Negative for vomiting and blood in stool.  Genitourinary: Negative for hematuria and decreased urine volume.  Musculoskeletal: Negative for acute joint swelling Skin: Negative for color change and wound.  Neurological: Negative for tremors and numbness other than noted  Psychiatric/Behavioral: Negative for decreased concentration or  hyperactivity.       Objective:   Physical Exam BP 118/78  Pulse 60  Temp(Src) 98.2 F (36.8 C) (Oral)  Ht 5\' 9"  (1.753 m)  Wt 216 lb (97.977 kg)  BMI 31.88 kg/m2  SpO2 99% VS noted,  Constitutional: Pt appears well-developed and well-nourished.  HENT: Head: NCAT.  Right Ear: External ear normal.  Left Ear: External ear normal.  Eyes: Conjunctivae and EOM are normal. Pupils are equal, round, and reactive to light.  Neck: Normal range of motion. Neck supple.  Cardiovascular: Normal rate and regular rhythm.   Pulmonary/Chest: Effort normal and breath sounds normal.  Neurological: Pt is alert. Not confused , motor/dtr/gait intact to LE's Skin: Skin is warm. No erythema. No rash Bilat knees NT, no effusion, FROM, no crepitus Spine:  nontender Psychiatric: Pt behavior is normal. Thought content normal.      Assessment & Plan:

## 2012-12-26 NOTE — Assessment & Plan Note (Signed)
bilat great toes - ok for referral per pt request

## 2012-12-26 NOTE — Assessment & Plan Note (Signed)
stable overall by history and exam, recent data reviewed with pt, and pt to continue medical treatment as before except add losartan in place of ACE due to throat issue,  to f/u any worsening symptoms or concerns

## 2012-12-31 ENCOUNTER — Other Ambulatory Visit: Payer: Self-pay | Admitting: Internal Medicine

## 2013-01-02 DIAGNOSIS — L608 Other nail disorders: Secondary | ICD-10-CM | POA: Diagnosis not present

## 2013-01-02 DIAGNOSIS — M19079 Primary osteoarthritis, unspecified ankle and foot: Secondary | ICD-10-CM | POA: Diagnosis not present

## 2013-01-02 DIAGNOSIS — B351 Tinea unguium: Secondary | ICD-10-CM | POA: Diagnosis not present

## 2013-01-04 ENCOUNTER — Other Ambulatory Visit: Payer: Self-pay | Admitting: Internal Medicine

## 2013-01-06 ENCOUNTER — Other Ambulatory Visit (INDEPENDENT_AMBULATORY_CARE_PROVIDER_SITE_OTHER): Payer: Medicare Other

## 2013-01-06 ENCOUNTER — Encounter: Payer: Self-pay | Admitting: Internal Medicine

## 2013-01-06 ENCOUNTER — Ambulatory Visit (INDEPENDENT_AMBULATORY_CARE_PROVIDER_SITE_OTHER): Payer: Medicare Other | Admitting: Internal Medicine

## 2013-01-06 VITALS — BP 112/70 | HR 60 | Temp 97.9°F | Ht 69.0 in | Wt 215.5 lb

## 2013-01-06 DIAGNOSIS — R109 Unspecified abdominal pain: Secondary | ICD-10-CM | POA: Diagnosis not present

## 2013-01-06 DIAGNOSIS — J309 Allergic rhinitis, unspecified: Secondary | ICD-10-CM | POA: Diagnosis not present

## 2013-01-06 DIAGNOSIS — M545 Low back pain, unspecified: Secondary | ICD-10-CM

## 2013-01-06 DIAGNOSIS — I10 Essential (primary) hypertension: Secondary | ICD-10-CM

## 2013-01-06 LAB — URINALYSIS, ROUTINE W REFLEX MICROSCOPIC
Hgb urine dipstick: NEGATIVE
Leukocytes, UA: NEGATIVE
Nitrite: NEGATIVE
Total Protein, Urine: NEGATIVE
pH: 6 (ref 5.0–8.0)

## 2013-01-06 NOTE — Progress Notes (Signed)
Subjective:    Patient ID: [name], male    DOB: 25-Jul-1941, 72 y.o.   MRN: [id number]  HPI  Here after being seen feb 10, but here with worsening moderated acute on chronic  LBP with radiation to right > left waist levels, worse x 2-3 days, some better today as he could finally sleep on right side last night;  no bowel or bladder change, fever, wt loss,  worsening LE pain/numbness/weakness, gait change or falls. No passed stones and, Denies urinary symptoms such as dysuria, frequency, urgency, flank pain, hematuria or n/v, fever, chills. Denies worsening reflux, abd pain, dysphagia, n/v, bowel change or blood, such a constipation. Still has GB, has hx of divericulosis  Tolerating med since last visit, Has seen podiatry. Flonase helps with nasal allergy symptoms  Past Medical History  Diagnosis Date  . Hypertension   . Hyperlipidemia   . BPH (benign prostatic hyperplasia)   . Diverticulosis of colon (without mention of hemorrhage)   . Esophageal reflux   . Abdominal pain, epigastric   . Depressive disorder, not elsewhere classified   . Lumbago   . Allergic rhinitis, cause unspecified   . Sick sinus syndrome     s/p PPM Conservation officer, historic buildings) by [employer]  . Persistent atrial fibrillation   . Chronic anticoagulation   . High risk medication use   . Paralyzed hemidiaphragm     LEFT  . Coronary artery calcification seen on CAT scan 12/26/2012   Past Surgical History  Procedure Laterality Date  . Insert / replace / remove pacemaker  05/2009    reports that he quit smoking about 34 years ago. His smoking use included Cigarettes. He has a 12.5 pack-year smoking history. He has quit using smokeless tobacco. His smokeless tobacco use included Chew. He reports that he does not drink alcohol or use illicit drugs. family history includes Arthritis in his paternal grandmother; Asthma in his sister; Cancer in his father; Diabetes in his mother; Hypertension in his brother, daughter, father, mother, sister,  and son; and Lung cancer in his father. No Known Allergies Current Outpatient Prescriptions on File Prior to Visit  Medication Sig Dispense Refill  . amLODipine (NORVASC) 10 MG tablet TAKE 1 TABLET (10 MG TOTAL) BY MOUTH DAILY.  90 tablet  3  . atorvastatin (LIPITOR) 40 MG tablet TAKE 1 TABLET (40 MG TOTAL) BY MOUTH DAILY.  90 tablet  2  . fexofenadine (ALLEGRA) 180 MG tablet Take 1 tablet (180 mg total) by mouth daily.  90 tablet  3  . flecainide (TAMBOCOR) 100 MG tablet TAKE 1 TABLET (100 MG TOTAL) BY MOUTH 2 (TWO) TIMES DAILY.  180 tablet  3  . fluticasone (FLONASE) 50 MCG/ACT nasal spray Place 1 spray into the nose 2 (two) times daily.  16 g  11  . gabapentin (NEURONTIN) 800 MG tablet TAKE 1 TABLET (800 MG TOTAL) BY MOUTH 3 (THREE) TIMES DAILY.  90 tablet  5  . latanoprost (XALATAN) 0.005 % ophthalmic solution       . losartan (COZAAR) 50 MG tablet Take 1 tablet (50 mg total) by mouth daily.  90 tablet  3  . omeprazole (PRILOSEC) 20 MG capsule TAKE ONE CAPSULE BY MOUTH EVERY DAY  90 capsule  3  . oxybutynin (DITROPAN-XL) 10 MG 24 hr tablet TAKE 1 TABLET (10 MG TOTAL) BY MOUTH DAILY.  90 tablet  2  . Tamsulosin HCl (FLOMAX) 0.4 MG CAPS TAKE 1 CAPSULE (0.4 MG TOTAL) BY MOUTH DAILY.  90  capsule  2  . traMADol (ULTRAM) 50 MG tablet Take 1 tablet (50 mg total) by mouth every 6 (six) hours as needed for pain.  60 tablet  2  . XARELTO 20 MG TABS TAKE 1 TABLET BY MOUTH EVERY DAY  90 tablet  3  . cetirizine (ZYRTEC) 10 MG tablet Take 1 tablet (10 mg total) by mouth daily.  90 tablet  3  . lisinopril (PRINIVIL,ZESTRIL) 10 MG tablet Take 1 tablet (10 mg total) by mouth daily.  90 tablet  3  . vardenafil (LEVITRA) 20 MG tablet Take 1 tablet (20 mg total) by mouth daily as needed for erectile dysfunction.  10 tablet  11  . [DISCONTINUED] furosemide (LASIX) 40 MG tablet Take 1 tablet (40 mg total) by mouth daily.  30 tablet  11  . [DISCONTINUED] oxybutynin (DITROPAN-XL) 10 MG 24 hr tablet Take 1 tablet  (10 mg total) by mouth daily.  90 tablet  3   No current facility-administered medications on file prior to visit.   Review of Systems  Constitutional: Negative for unexpected weight change, or unusual diaphoresis  HENT: Negative for tinnitus.   Eyes: Negative for photophobia and visual disturbance.  Respiratory: Negative for choking and stridor.   Gastrointestinal: Negative for vomiting and blood in stool.  Genitourinary: Negative for hematuria and decreased urine volume.  Musculoskeletal: Negative for acute joint swelling Skin: Negative for color change and wound.  Neurological: Negative for tremors and numbness other than noted  Psychiatric/Behavioral: Negative for decreased concentration or  hyperactivity.       Objective:   Physical Exam BP 112/70  Pulse 60  Temp(Src) 97.9 F (36.6 C) (Oral)  Ht 5\' 9"  (1.753 m)  Wt 215 lb 8 oz (97.75 kg)  BMI 31.81 kg/m2  SpO2 94% VS noted,  Constitutional: Pt appears well-developed and well-nourished.  HENT: Head: NCAT.  Right Ear: External ear normal.  Left Ear: External ear normal.  Eyes: Conjunctivae and EOM are normal. Pupils are equal, round, and reactive to light.  Neck: Normal range of motion. Neck supple.  Cardiovascular: Normal rate and regular rhythm.   Pulmonary/Chest: Effort normal and breath sounds normal.  Abd:  Soft, NT, non-distended, + BS Neurological: Pt is alert. Not confused  Skin: Skin is warm. No erythema.  Psychiatric: Pt behavior is normal. Thought content normal.  Spine: nontender Has mild right lumbar paravertebral and flank tender    Assessment & Plan:

## 2013-01-06 NOTE — Patient Instructions (Signed)
Please continue all other medications as before, and refills have been done if requested. You will be contacted regarding the referral for: orthopedic, and the abdomen ultrasound Please go to the LAB in the Basement (turn left off the elevator) for the tests to be done today You will be contacted by phone if any changes need to be made immediately.  Otherwise, you will receive a letter about your results with an explanation, but please check with MyChart first. Please keep your appointments with your specialists as you have planned

## 2013-01-07 NOTE — Assessment & Plan Note (Signed)
Unclear etiology, for labs today

## 2013-01-07 NOTE — Assessment & Plan Note (Signed)
Improved and stable overall by history and exam, recent data reviewed with pt, and pt to continue medical treatment as before,  to f/u any worsening symptoms or concerns BP Readings from Last 3 Encounters:  01/06/13 112/70  12/26/12 118/78  12/08/12 122/78

## 2013-01-07 NOTE — Assessment & Plan Note (Signed)
Suspect may be origin of pain with referred pain to side and abdomen, for cont pain control

## 2013-01-07 NOTE — Assessment & Plan Note (Signed)
Improved, cont flonase

## 2013-01-09 ENCOUNTER — Ambulatory Visit
Admission: RE | Admit: 2013-01-09 | Discharge: 2013-01-09 | Disposition: A | Discharge status: Home / Self Care | Payer: Medicare Other | Source: Ambulatory Visit | Attending: Internal Medicine | Admitting: Internal Medicine

## 2013-01-09 DIAGNOSIS — R109 Unspecified abdominal pain: Secondary | ICD-10-CM

## 2013-01-09 DIAGNOSIS — K7689 Other specified diseases of liver: Secondary | ICD-10-CM | POA: Diagnosis not present

## 2013-01-10 DIAGNOSIS — M545 Low back pain, unspecified: Secondary | ICD-10-CM | POA: Diagnosis not present

## 2013-01-10 DIAGNOSIS — M19079 Primary osteoarthritis, unspecified ankle and foot: Secondary | ICD-10-CM | POA: Diagnosis not present

## 2013-01-10 DIAGNOSIS — M5137 Other intervertebral disc degeneration, lumbosacral region: Secondary | ICD-10-CM | POA: Diagnosis not present

## 2013-01-19 ENCOUNTER — Telehealth: Payer: Self-pay | Admitting: Internal Medicine

## 2013-01-19 NOTE — Telephone Encounter (Signed)
Pt wants to know if it is ok to take Vit B-12 tablets OTC along with his medicines.

## 2013-01-19 NOTE — Telephone Encounter (Signed)
Patient informed. 

## 2013-01-19 NOTE — Telephone Encounter (Signed)
Yes, this should be fine.  thanks 

## 2013-01-23 DIAGNOSIS — Z79899 Other long term (current) drug therapy: Secondary | ICD-10-CM | POA: Diagnosis not present

## 2013-01-23 DIAGNOSIS — B353 Tinea pedis: Secondary | ICD-10-CM | POA: Diagnosis not present

## 2013-01-23 DIAGNOSIS — L608 Other nail disorders: Secondary | ICD-10-CM | POA: Diagnosis not present

## 2013-01-25 DIAGNOSIS — N529 Male erectile dysfunction, unspecified: Secondary | ICD-10-CM | POA: Diagnosis not present

## 2013-01-25 DIAGNOSIS — Z125 Encounter for screening for malignant neoplasm of prostate: Secondary | ICD-10-CM | POA: Diagnosis not present

## 2013-01-25 DIAGNOSIS — N318 Other neuromuscular dysfunction of bladder: Secondary | ICD-10-CM | POA: Diagnosis not present

## 2013-02-03 DIAGNOSIS — Z79899 Other long term (current) drug therapy: Secondary | ICD-10-CM | POA: Diagnosis not present

## 2013-02-08 ENCOUNTER — Other Ambulatory Visit: Payer: Self-pay | Admitting: Internal Medicine

## 2013-02-08 NOTE — Telephone Encounter (Signed)
Gabapentin done erx  

## 2013-02-10 ENCOUNTER — Ambulatory Visit (INDEPENDENT_AMBULATORY_CARE_PROVIDER_SITE_OTHER): Payer: Medicare Other | Admitting: Internal Medicine

## 2013-02-10 DIAGNOSIS — R062 Wheezing: Secondary | ICD-10-CM

## 2013-02-10 DIAGNOSIS — J309 Allergic rhinitis, unspecified: Secondary | ICD-10-CM

## 2013-02-10 DIAGNOSIS — R0989 Other specified symptoms and signs involving the circulatory and respiratory systems: Secondary | ICD-10-CM | POA: Diagnosis not present

## 2013-02-10 DIAGNOSIS — J069 Acute upper respiratory infection, unspecified: Secondary | ICD-10-CM | POA: Diagnosis not present

## 2013-02-10 MED ORDER — FLUTICASONE PROPIONATE 50 MCG/ACT NA SUSP: 2.0000 | Freq: Two times a day (BID) | NASAL | Status: DC

## 2013-02-10 MED ORDER — AZITHROMYCIN 250 MG PO TABS: ORAL_TABLET | ORAL | Status: DC

## 2013-02-10 MED ORDER — HYDROCODONE-HOMATROPINE 5-1.5 MG/5ML PO SYRP: 5.0000 mL | ORAL_SOLUTION | Freq: Four times a day (QID) | ORAL | Status: DC | PRN

## 2013-02-10 MED ORDER — PREDNISONE 10 MG PO TABS: 10.0000 mg | ORAL_TABLET | Freq: Every day | ORAL | Status: DC

## 2013-02-10 NOTE — Patient Instructions (Signed)
Please take all new medication as prescribed - the antibiotic, cough medicine, and prednisone The flonase was increased as well Please continue all other medications as before, and refills have been done if requested. Please call or return if you have any worsening or persistent symptoms

## 2013-02-10 NOTE — Progress Notes (Signed)
Subjective:    Patient ID: [name], male    DOB: 1941/06/07, 72 y.o.   MRN: [id number]  HPI   Here with 2-3 days acute onset fever, facial pain, pressure, headache, general weakness and malaise, and greenish d/c, with mild ST and cough, but pt denies chest pain, wheezing, increased sob or doe, orthopnea, PND, increased LE swelling, palpitations, dizziness or syncope, except for onset mild wheezing and sob last pm. Does have several wks ongoing nasal allergy symptoms with clearish congestion, itch and sneezing, without fever, pain, ST, cough, swelling or wheezing. Chart reviewed - has hx of elev left hemidiaphragm Past Medical History  Diagnosis Date  . Hypertension   . Hyperlipidemia   . BPH (benign prostatic hyperplasia)   . Diverticulosis of colon (without mention of hemorrhage)   . Esophageal reflux   . Abdominal pain, epigastric   . Depressive disorder, not elsewhere classified   . Lumbago   . Allergic rhinitis, cause unspecified   . Sick sinus syndrome     s/p PPM Conservation officer, historic buildings) by [employer]  . Persistent atrial fibrillation   . Chronic anticoagulation   . High risk medication use   . Paralyzed hemidiaphragm     LEFT  . Coronary artery calcification seen on CAT scan 12/26/2012   Past Surgical History  Procedure Laterality Date  . Insert / replace / remove pacemaker  05/2009    reports that he quit smoking about 34 years ago. His smoking use included Cigarettes. He has a 12.5 pack-year smoking history. He has quit using smokeless tobacco. His smokeless tobacco use included Chew. He reports that he does not drink alcohol or use illicit drugs. family history includes Arthritis in his paternal grandmother; Asthma in his sister; Cancer in his father; Diabetes in his mother; Hypertension in his brother, daughter, father, mother, sister, and son; and Lung cancer in his father. No Known Allergies Current Outpatient Prescriptions on File Prior to Visit  Medication Sig Dispense Refill   . amLODipine (NORVASC) 10 MG tablet TAKE 1 TABLET (10 MG TOTAL) BY MOUTH DAILY.  90 tablet  3  . atorvastatin (LIPITOR) 40 MG tablet TAKE 1 TABLET (40 MG TOTAL) BY MOUTH DAILY.  90 tablet  2  . cetirizine (ZYRTEC) 10 MG tablet Take 1 tablet (10 mg total) by mouth daily.  90 tablet  3  . fexofenadine (ALLEGRA) 180 MG tablet Take 1 tablet (180 mg total) by mouth daily.  90 tablet  3  . flecainide (TAMBOCOR) 100 MG tablet TAKE 1 TABLET (100 MG TOTAL) BY MOUTH 2 (TWO) TIMES DAILY.  180 tablet  3  . gabapentin (NEURONTIN) 800 MG tablet TAKE 1 TABLET (800 MG TOTAL) BY MOUTH 3 (THREE) TIMES DAILY.  90 tablet  5  . latanoprost (XALATAN) 0.005 % ophthalmic solution       . lisinopril (PRINIVIL,ZESTRIL) 10 MG tablet Take 1 tablet (10 mg total) by mouth daily.  90 tablet  3  . losartan (COZAAR) 50 MG tablet Take 1 tablet (50 mg total) by mouth daily.  90 tablet  3  . omeprazole (PRILOSEC) 20 MG capsule TAKE ONE CAPSULE BY MOUTH EVERY DAY  90 capsule  3  . oxybutynin (DITROPAN-XL) 10 MG 24 hr tablet TAKE 1 TABLET (10 MG TOTAL) BY MOUTH DAILY.  90 tablet  2  . Tamsulosin HCl (FLOMAX) 0.4 MG CAPS TAKE 1 CAPSULE (0.4 MG TOTAL) BY MOUTH DAILY.  90 capsule  2  . traMADol (ULTRAM) 50 MG tablet Take 1  tablet (50 mg total) by mouth every 6 (six) hours as needed for pain.  60 tablet  2  . vardenafil (LEVITRA) 20 MG tablet Take 1 tablet (20 mg total) by mouth daily as needed for erectile dysfunction.  10 tablet  11  . XARELTO 20 MG TABS TAKE 1 TABLET BY MOUTH EVERY DAY  90 tablet  3  . [DISCONTINUED] furosemide (LASIX) 40 MG tablet Take 1 tablet (40 mg total) by mouth daily.  30 tablet  11  . [DISCONTINUED] oxybutynin (DITROPAN-XL) 10 MG 24 hr tablet Take 1 tablet (10 mg total) by mouth daily.  90 tablet  3   No current facility-administered medications on file prior to visit.   Review of Systems  Constitutional: Negative for unexpected weight change, or unusual diaphoresis  HENT: Negative for tinnitus.   Eyes:  Negative for photophobia and visual disturbance.  Respiratory: Negative for choking and stridor.   Gastrointestinal: Negative for vomiting and blood in stool.  Genitourinary: Negative for hematuria and decreased urine volume.  Musculoskeletal: Negative for acute joint swelling Skin: Negative for color change and wound.  Neurological: Negative for tremors and numbness other than noted  Psychiatric/Behavioral: Negative for decreased concentration or  hyperactivity.       Objective:   Physical Exam There were no vitals taken for this visit. VS noted, mild ill Constitutional: Pt appears well-developed and well-nourished.  HENT: Head: NCAT.  Right Ear: External ear normal.  Left Ear: External ear normal.  Eyes: Conjunctivae and EOM are normal. Pupils are equal, round, and reactive to light.  Bilat tm's with mild erythema.  Max sinus areas mild tender.  Pharynx with mild erythema, no exudate Neck: Normal range of motion. Neck supple.  Cardiovascular: Normal rate and regular rhythm.   Pulmonary/Chest: Effort normal and breath sounds with few left lower rales, few wheeze bilat Neurological: Pt is alert. Not confused  Skin: Skin is warm. No erythema.  Psychiatric: Pt behavior is normal. Thought content normal.     Assessment & Plan:

## 2013-02-11 ENCOUNTER — Encounter: Payer: Self-pay | Admitting: Internal Medicine

## 2013-02-11 NOTE — Assessment & Plan Note (Signed)
Mild, for predpack asd,  to f/u any worsening symptoms or concerns

## 2013-02-11 NOTE — Assessment & Plan Note (Signed)
Mild to mod, for antibx course,  to f/u any worsening symptoms or concerns 

## 2013-02-11 NOTE — Assessment & Plan Note (Signed)
Declines cxr, ? likely related to hx of elev left diaphgram

## 2013-02-11 NOTE — Assessment & Plan Note (Signed)
To increase the flonase asd,  to f/u any worsening symptoms or concerns

## 2013-02-13 ENCOUNTER — Telehealth: Payer: Self-pay | Admitting: Internal Medicine

## 2013-02-13 ENCOUNTER — Ambulatory Visit (INDEPENDENT_AMBULATORY_CARE_PROVIDER_SITE_OTHER)
Admission: RE | Admit: 2013-02-13 | Discharge: 2013-02-13 | Disposition: A | Discharge status: Home / Self Care | Payer: Medicare Other | Source: Ambulatory Visit | Attending: Internal Medicine | Admitting: Internal Medicine

## 2013-02-13 DIAGNOSIS — R0989 Other specified symptoms and signs involving the circulatory and respiratory systems: Secondary | ICD-10-CM | POA: Diagnosis not present

## 2013-02-13 DIAGNOSIS — Z0000 Encounter for general adult medical examination without abnormal findings: Secondary | ICD-10-CM | POA: Diagnosis not present

## 2013-02-13 DIAGNOSIS — I714 Abdominal aortic aneurysm, without rupture, unspecified: Secondary | ICD-10-CM

## 2013-02-13 DIAGNOSIS — J984 Other disorders of lung: Secondary | ICD-10-CM | POA: Diagnosis not present

## 2013-02-13 DIAGNOSIS — R05 Cough: Secondary | ICD-10-CM

## 2013-02-13 DIAGNOSIS — R059 Cough, unspecified: Secondary | ICD-10-CM

## 2013-02-13 NOTE — Telephone Encounter (Signed)
Patient informed to come in for cxr asap.

## 2013-02-13 NOTE — Telephone Encounter (Signed)
Antibiotics have not helped the breathing.  Should he come for an x-ray?

## 2013-02-13 NOTE — Telephone Encounter (Signed)
Ok for cxr at his convenience - hopefully today or asap

## 2013-02-14 ENCOUNTER — Telehealth: Payer: Self-pay | Admitting: Internal Medicine

## 2013-02-14 ENCOUNTER — Ambulatory Visit (INDEPENDENT_AMBULATORY_CARE_PROVIDER_SITE_OTHER): Payer: Medicare Other | Admitting: Internal Medicine

## 2013-02-14 ENCOUNTER — Encounter: Payer: Self-pay | Admitting: Internal Medicine

## 2013-02-14 VITALS — BP 130/78 | HR 65 | Temp 98.5°F | Wt 205.1 lb

## 2013-02-14 DIAGNOSIS — R0602 Shortness of breath: Secondary | ICD-10-CM

## 2013-02-14 DIAGNOSIS — J984 Other disorders of lung: Secondary | ICD-10-CM | POA: Diagnosis not present

## 2013-02-14 MED ORDER — TIOTROPIUM BROMIDE MONOHYDRATE 18 MCG IN CAPS: 18.0000 ug | ORAL_CAPSULE | Freq: Every day | RESPIRATORY_TRACT | Status: DC

## 2013-02-14 NOTE — Assessment & Plan Note (Signed)
Chronic and progressive symptoms, no evidence for acute flare Treated last week symptomatically for bronchospasm and bronchitis with Pred taper and Z-Pak, Hydromet> Unimproved Reviewed chest x-ray yesterday 3/31: no acute process, chronic left hemi-diaphragm paralysis Reviewed PFTs April 2013 for same symptoms: severe decrease in diffusing capacity with moderate restrictive lung disease, no response to bronchodilators On therapy for allergic rhinitis and PPI Recently change ACE inhibitor to ARB -still unimproved No improvement with trial albuterol January 2014, pulmonary note reviewed We'll try Spiriva once daily and refer to pulmonary for followup on same Cardiac evaluation also reviewed and unremarkable, no evidence of contribution to same symptoms

## 2013-02-14 NOTE — Patient Instructions (Signed)
It was good to see you today. We have reviewed your prior records including labs and tests today Start Spiriva inhaler once daily as instructed - sample given to you today and prescription sent to your pharmacy We'll help arrange followup with Dr. Corliss Blacker for review of your breathing problems and symptoms, at office will call regarding this appointment Followup with Dr. Jonny Ruiz in 4-6 weeks, please call sooner if problems

## 2013-02-14 NOTE — Progress Notes (Signed)
Subjective:    Patient ID: [name], male    DOB: 03-27-1941, 72 y.o.   MRN: [id number]  Shortness of Breath This is a chronic problem. The current episode started more than 1 month ago. The problem occurs every few minutes. The problem has been waxing and waning. Associated symptoms include orthopnea and wheezing (occ). Pertinent negatives include no abdominal pain, chest pain, claudication, ear pain, fever, headaches, hemoptysis, leg swelling, PND, sore throat (but throat "congestion"), sputum production, syncope or vomiting. The symptoms are aggravated by any activity, lying flat and URIs. He has tried beta agonist inhalers and prescription cough suppressants for the symptoms. The treatment provided no relief. His past medical history is significant for allergies, CAD and chronic lung disease. There is no history of a recent surgery.    Past Medical History  Diagnosis Date  . Hypertension   . Hyperlipidemia   . BPH (benign prostatic hyperplasia)   . Diverticulosis of colon (without mention of hemorrhage)   . Esophageal reflux   . Abdominal pain, epigastric   . Depressive disorder, not elsewhere classified   . Lumbago   . Allergic rhinitis, cause unspecified   . Sick sinus syndrome     s/p PPM Conservation officer, historic buildings) by [employer]  . Persistent atrial fibrillation   . Chronic anticoagulation   . High risk medication use   . Paralyzed hemidiaphragm     LEFT  . Coronary artery calcification seen on CAT scan 12/26/2012    Review of Systems  Constitutional: Negative for fever.  HENT: Negative for ear pain and sore throat (but throat "congestion").   Respiratory: Positive for shortness of breath and wheezing (occ). Negative for hemoptysis and sputum production.   Cardiovascular: Positive for orthopnea. Negative for chest pain, claudication, leg swelling, syncope and PND.  Gastrointestinal: Negative for vomiting and abdominal pain.  Neurological: Negative for headaches.       Objective:   Physical Exam BP 130/78  Pulse 65  Temp(Src) 98.5 F (36.9 C) (Oral)  Wt 205 lb 1.9 oz (93.042 kg)  BMI 30.28 kg/m2  SpO2 97% Wt Readings from Last 3 Encounters:  02/14/13 205 lb 1.9 oz (93.042 kg)  01/06/13 215 lb 8 oz (97.75 kg)  12/26/12 216 lb (97.977 kg)   Constitutional:  He is overweight, but appears well-developed and well-nourished. No distress. Wife at side HENT: sinus nontender - TMs clear without effusion or erythema - OP with mild cobblestoning and red but no exudate or PND Neck: Normal range of motion. Neck supple. No JVD present. No thyromegaly present.  Cardiovascular: Normal rate, regular rhythm and normal heart sounds.  No murmur heard. no BLE edema Pulmonary/Chest: Effort normal and breath sounds with few rhonchi in L base. No respiratory distress. no wheezes.  Psychiatric: he has a normal mood and affect. behavior is normal. Judgment and thought content normal.   Lab Results  Component Value Date   WBC 6.9 12/26/2012   HGB 15.2 12/26/2012   HCT 46.8 12/26/2012   PLT 199.0 12/26/2012   GLUCOSE 94 12/26/2012   CHOL 156 12/26/2012   TRIG 73.0 12/26/2012   HDL 58.70 12/26/2012   LDLDIRECT 124.0 12/24/2011   LDLCALC 83 12/26/2012   ALT 30 12/26/2012   AST 24 12/26/2012   NA 140 12/26/2012   K 4.5 12/26/2012   CL 103 12/26/2012   CREATININE 1.0 12/26/2012   BUN 12 12/26/2012   CO2 30 12/26/2012   TSH 0.74 12/26/2012   PSA 0.97 12/26/2012  INR 1.3 06/02/2009   Dg Chest 2 View  02/13/2013  *RADIOLOGY REPORT*  Clinical Data: Cough.  Dyspnea.  Left-sided rales on physical exam. Former smoker.  CHEST - 2 VIEW  Comparison: 01/15/2012  Findings: Mild elevation of left hemidiaphragm and left basilar scarring persists.  No evidence of acute infiltrate or pulmonary edema.  No evidence of pleural effusion.  Heart size remains within normal limits.  Dual lead transvenous pacemaker remains in appropriate position.  IMPRESSION: Persistent by basilar scarring with mild elevation left  hemidiaphragm.  No acute findings.   Original Report Authenticated By: Myles Rosenthal, M.D.        Assessment & Plan:   See problem list. Medications and labs reviewed today.

## 2013-02-14 NOTE — Telephone Encounter (Signed)
Patient Information:  Caller Name: [place]  Phone: [phone]  Patient: [name], [name]  Gender: Male  DOB: [birth year]  Age: 72 Years  PCP: Oliver Barre (Adults only)  Office Follow Up:  Does the office need to follow up with this patient?: No  Instructions For The Office: N/A  RN Note:  Denies chest pain. Caller states shortness of breath comes and goes, but is more frequent than last week. Caller states shortness of breath while sitting on the bed talking on the phone. Caller states it is no better than when seen in the office. Caller unable to check pulse- Pacemaker present. Decreased activity level. Caller is eating, drinking, and voiding within normal limits. Intermittent wheezing present.  Symptoms  Reason For Call & Symptoms: shortness of breath- caller states no better since finishing antibiotics.  Reviewed Health History In EMR: Yes  Reviewed Medications In EMR: Yes  Reviewed Allergies In EMR: Yes  Reviewed Surgeries / Procedures: Yes  Date of Onset of Symptoms: 02/07/2013  Guideline(s) Used:  Breathing Difficulty  Disposition Per Guideline:   Go to Office Now  Reason For Disposition Reached:   Mild difficulty breathing (e.g., minimal/no SOB at rest, SOB with walking, pulse < 100) of new onset or worse than normal  Advice Given:  General Care Advice for Breathing Difficulty:  Find position of greatest comfort. For most patients the best position is semi-upright (e.g., sitting up in a comfortable chair or lying back against pillows).  Elevate head of bed (e.g., use pillows or place blocks under bed).  Avoid smoke or fume exposure.  Create a draft (e.g., use a fan directed at the face, or open a window).  Keep room temperature slightly on the cool side.  Limit activities or space activities apart during the day. Prioritize activities.  Use a humidifier.  Call Back If:  Severe difficulty breathing occurs  Fever more than 100.5 F (38.1 C)  You become worse.  Patient Will  Follow Care Advice:  YES  Appointment Scheduled:  02/14/2013 11:30:00 Appointment Scheduled Provider:  Rene Paci (Adults only)

## 2013-03-02 ENCOUNTER — Encounter: Payer: Self-pay | Admitting: Emergency Medicine

## 2013-03-02 ENCOUNTER — Ambulatory Visit (INDEPENDENT_AMBULATORY_CARE_PROVIDER_SITE_OTHER): Payer: Medicare Other | Admitting: Emergency Medicine

## 2013-03-02 VITALS — BP 120/70 | HR 62 | Temp 97.7°F | Ht 69.0 in | Wt 215.0 lb

## 2013-03-02 DIAGNOSIS — R0609 Other forms of dyspnea: Secondary | ICD-10-CM | POA: Diagnosis not present

## 2013-03-02 DIAGNOSIS — K117 Disturbances of salivary secretion: Secondary | ICD-10-CM | POA: Diagnosis not present

## 2013-03-02 DIAGNOSIS — R0989 Other specified symptoms and signs involving the circulatory and respiratory systems: Secondary | ICD-10-CM

## 2013-03-02 DIAGNOSIS — J309 Allergic rhinitis, unspecified: Secondary | ICD-10-CM | POA: Diagnosis not present

## 2013-03-02 DIAGNOSIS — R682 Dry mouth, unspecified: Secondary | ICD-10-CM | POA: Insufficient documentation

## 2013-03-02 NOTE — Patient Instructions (Addendum)
Please stop Spiriva Continue your allegra and fluticasone nasal spray.  Follow with Dr Delton Coombes as needed for any changes in you breathing.

## 2013-03-02 NOTE — Progress Notes (Signed)
  Subjective:    Patient ID: [name], male    DOB: Aug 03, 1941, 72 y.o.   MRN: [id number]  HPI 72 yo former smoker with HTN, hyperlipidemia, s/p pacer for sick sinus syndrome. He has been evaluated by Dr Johney Frame and Norma Fredrickson for dyspnea over the last year. The evaluation is most notable for paralyzed L hemidiaphragm (this wasn't present immediately after pacer was placed). Also with reassuring myoview stress test in 10/12, TTE w LVEF 50-55% in 10/12. He underwent PFT today 02/26/12 - spiro most consistent w restriction + possible superimposed obstruction. No BD response. Lung volumes confirm restriction. DLCO decreased but corrects for Va.  His SOB is w exertion, happens with certain activities. He is able to walk at slow pace, able to do some yard work, but trouble climbing stairs. He also gets SOB if he talks to much, bends over. He has experienced some cough, non-productive. He has wheeze with activity. Never tried a SABA to see if he benefits.  He had walking oximetry, said that he did not desat.   ROV 12/08/12 -- follow up for dyspnea. Has hx of SSS and pacer as above. Also with paralyzed R HD and restriction on PFT. Because there was some question of coexisting obstruction, we did a trial of albuterol >> didn't notice any change. His most bothersome complaint is some throat congestion, able to cough mucous out, worse after a meal. He does have nose and sinus congestion. He is on zyrtec, allegra, fluticasone qd - also on an ACE-I.   ROV 03/02/13 -- follows up for restrictive lung dz. Has not responded to BD's in the past. He developed URI sx about 2 wk ago with flu-like sx, body aches, weak and low energy + dyspnea and possibly some wheeze. He was started on Spiriva on 4/1 as a trial - no change in his breathing. Presents today with severe dry mouth. He is on a nasal steroid + allegra. ACE-I was changed to ARB recently. Cough is better.      Objective:   Physical Exam Filed Vitals:   03/02/13  1008  BP: 120/70  Pulse: 62  Temp: 97.7 F (36.5 C)   Gen: Pleasant, well-nourished, in no distress,  normal affect  ENT: No lesions,  mouth clear,  oropharynx clear, no postnasal drip  Neck: No JVD, no TMG, no carotid bruits  Lungs: No use of accessory muscles, no dullness to percussion, decreased BS at L base, no wheezes  Cardiovascular: RRR, heart sounds normal, no murmur or gallops, trace edema   Musculoskeletal: No deformities, no cyanosis or clubbing  Neuro: alert, non focal  Skin: Warm, no lesions or rashes      Assessment & Plan:  Dry mouth Due to Spiriva - will stop. He has noticed no improvement although only taking x few weeks. Side-effect/benefit seems to fall on side of stopping this.  DOE (dyspnea on exertion) Due primarily to restriction - defer BD's  ALLERGIC RHINITIS - allegra + fluticasone nasal spray

## 2013-03-02 NOTE — Assessment & Plan Note (Signed)
Due primarily to restriction - defer BD's

## 2013-03-02 NOTE — Assessment & Plan Note (Signed)
Due to Spiriva - will stop. He has noticed no improvement although only taking x few weeks. Side-effect/benefit seems to fall on side of stopping this.

## 2013-03-02 NOTE — Assessment & Plan Note (Signed)
-   allegra + fluticasone nasal spray 

## 2013-03-13 ENCOUNTER — Other Ambulatory Visit: Payer: Self-pay | Admitting: Internal Medicine

## 2013-03-13 NOTE — Telephone Encounter (Signed)
Done erx 

## 2013-03-28 ENCOUNTER — Other Ambulatory Visit (INDEPENDENT_AMBULATORY_CARE_PROVIDER_SITE_OTHER): Payer: Medicare Other

## 2013-03-28 ENCOUNTER — Ambulatory Visit (INDEPENDENT_AMBULATORY_CARE_PROVIDER_SITE_OTHER): Payer: Medicare Other | Admitting: Internal Medicine

## 2013-03-28 ENCOUNTER — Encounter: Payer: Self-pay | Admitting: Internal Medicine

## 2013-03-28 VITALS — BP 112/80 | HR 60 | Temp 97.3°F | Ht 69.0 in | Wt 210.1 lb

## 2013-03-28 DIAGNOSIS — I10 Essential (primary) hypertension: Secondary | ICD-10-CM

## 2013-03-28 DIAGNOSIS — R634 Abnormal weight loss: Secondary | ICD-10-CM

## 2013-03-28 DIAGNOSIS — R109 Unspecified abdominal pain: Secondary | ICD-10-CM

## 2013-03-28 LAB — URINALYSIS, ROUTINE W REFLEX MICROSCOPIC
Bilirubin Urine: NEGATIVE
Nitrite: NEGATIVE
Total Protein, Urine: NEGATIVE
pH: 6.5 (ref 5.0–8.0)

## 2013-03-28 LAB — CBC WITH DIFFERENTIAL/PLATELET
Basophils Relative: 1.9 % (ref 0.0–3.0)
Eosinophils Relative: 1.1 % (ref 0.0–5.0)
Hemoglobin: 15.3 g/dL (ref 13.0–17.0)
MCV: 89.2 fl (ref 78.0–100.0)
Monocytes Absolute: 0.9 10*3/uL (ref 0.1–1.0)
Neutrophils Relative %: 61.5 % (ref 43.0–77.0)
RBC: 5.07 Mil/uL (ref 4.22–5.81)
WBC: 7.4 10*3/uL (ref 4.5–10.5)

## 2013-03-28 LAB — HEPATIC FUNCTION PANEL
ALT: 27 U/L (ref 0–53)
Albumin: 4.4 g/dL (ref 3.5–5.2)
Bilirubin, Direct: 0.1 mg/dL (ref 0.0–0.3)
Total Protein: 7.7 g/dL (ref 6.0–8.3)

## 2013-03-28 LAB — BASIC METABOLIC PANEL
BUN: 13 mg/dL (ref 6–23)
CO2: 28 mEq/L (ref 19–32)
Calcium: 9.5 mg/dL (ref 8.4–10.5)
Chloride: 103 mEq/L (ref 96–112)
Creatinine, Ser: 1 mg/dL (ref 0.4–1.5)
Glucose, Bld: 103 mg/dL — ABNORMAL HIGH (ref 70–99)

## 2013-03-28 MED ORDER — TERBINAFINE HCL 250 MG PO TABS: 250.0000 mg | ORAL_TABLET | Freq: Every day | ORAL | Status: DC

## 2013-03-28 NOTE — Assessment & Plan Note (Signed)
As above,  to f/u any worsening symptoms or concerns 

## 2013-03-28 NOTE — Progress Notes (Signed)
Subjective:    Patient ID: [name], male    DOB: Nov 02, 1941, 72 y.o.   MRN: [id number]  HPI Here to f/u after last visit flu like symptoms resolved.    Here with right periscapular/flank pain, mild to mod, does not keep him awake,,not worse to move the shoulder but can feel ? some with putting the arm behind the back, with some decreased appetite and over 5 lbs unintentional wt loss, pt very concerned, asks for imaging. Pt denies chest pain, increased sob or doe, wheezing, orthopnea, PND, increased LE swelling, palpitations, dizziness or syncope.  Denies worsening reflux, abd pain, dysphagia, n/v, bowel change or blood. Pt denies polydipsia, polyuria,  On lamisil 250 mg per day per podiatry for toenail fungus with OV planned f/u at 4 wks Past Medical History  Diagnosis Date  . Hypertension   . Hyperlipidemia   . BPH (benign prostatic hyperplasia)   . Diverticulosis of colon (without mention of hemorrhage)   . Esophageal reflux   . Abdominal pain, epigastric   . Depressive disorder, not elsewhere classified   . Lumbago   . Allergic rhinitis, cause unspecified   . Sick sinus syndrome     s/p PPM Conservation officer, historic buildings) by [employer]  . Persistent atrial fibrillation   . Chronic anticoagulation   . High risk medication use   . Paralyzed hemidiaphragm     LEFT  . Coronary artery calcification seen on CAT scan 12/26/2012   Past Surgical History  Procedure Laterality Date  . Insert / replace / remove pacemaker  05/2009    reports that he quit smoking about 34 years ago. His smoking use included Cigarettes. He has a 12.5 pack-year smoking history. He has quit using smokeless tobacco. His smokeless tobacco use included Chew. He reports that he does not drink alcohol or use illicit drugs. family history includes Arthritis in his paternal grandmother; Asthma in his sister; Cancer in his father; Diabetes in his mother; Hypertension in his brother, daughter, father, mother, sister, and son; and Lung cancer  in his father. No Known Allergies Current Outpatient Prescriptions on File Prior to Visit  Medication Sig Dispense Refill  . amLODipine (NORVASC) 10 MG tablet TAKE 1 TABLET (10 MG TOTAL) BY MOUTH DAILY.  90 tablet  3  . atorvastatin (LIPITOR) 40 MG tablet TAKE 1 TABLET (40 MG TOTAL) BY MOUTH DAILY.  90 tablet  2  . fexofenadine (ALLEGRA) 180 MG tablet Take 1 tablet (180 mg total) by mouth daily.  90 tablet  3  . flecainide (TAMBOCOR) 100 MG tablet TAKE 1 TABLET (100 MG TOTAL) BY MOUTH 2 (TWO) TIMES DAILY.  180 tablet  3  . fluticasone (FLONASE) 50 MCG/ACT nasal spray Place 2 sprays into the nose 2 (two) times daily.  16 g  5  . gabapentin (NEURONTIN) 800 MG tablet TAKE 1 TABLET (800 MG TOTAL) BY MOUTH 3 (THREE) TIMES DAILY.  90 tablet  5  . HYDROcodone-homatropine (HYCODAN) 5-1.5 MG/5ML syrup Take 5 mLs by mouth every 6 (six) hours as needed for cough.  120 mL  1  . latanoprost (XALATAN) 0.005 % ophthalmic solution       . losartan (COZAAR) 50 MG tablet Take 1 tablet (50 mg total) by mouth daily.  90 tablet  3  . omeprazole (PRILOSEC) 20 MG capsule TAKE ONE CAPSULE BY MOUTH EVERY DAY  90 capsule  3  . oxybutynin (DITROPAN-XL) 10 MG 24 hr tablet TAKE 1 TABLET (10 MG TOTAL) BY MOUTH DAILY.  90 tablet  2  . Tamsulosin HCl (FLOMAX) 0.4 MG CAPS TAKE 1 CAPSULE (0.4 MG TOTAL) BY MOUTH DAILY.  90 capsule  2  . tiotropium (SPIRIVA HANDIHALER) 18 MCG inhalation capsule Place 1 capsule (18 mcg total) into inhaler and inhale daily.  30 capsule  12  . traMADol (ULTRAM) 50 MG tablet TAKE 1 TABLET (50 MG TOTAL) BY MOUTH EVERY 6 (SIX) HOURS AS NEEDED FOR PAIN.  60 tablet  2  . XARELTO 20 MG TABS TAKE 1 TABLET BY MOUTH EVERY DAY  90 tablet  3  . cetirizine (ZYRTEC) 10 MG tablet Take 1 tablet (10 mg total) by mouth daily.  90 tablet  3  . lisinopril (PRINIVIL,ZESTRIL) 10 MG tablet Take 1 tablet (10 mg total) by mouth daily.  90 tablet  3  . vardenafil (LEVITRA) 20 MG tablet Take 1 tablet (20 mg total) by mouth  daily as needed for erectile dysfunction.  10 tablet  11  . [DISCONTINUED] furosemide (LASIX) 40 MG tablet Take 1 tablet (40 mg total) by mouth daily.  30 tablet  11  . [DISCONTINUED] oxybutynin (DITROPAN-XL) 10 MG 24 hr tablet Take 1 tablet (10 mg total) by mouth daily.  90 tablet  3   No current facility-administered medications on file prior to visit.    Review of Systems  Constitutional: Negative for unexpected weight change, or unusual diaphoresis  HENT: Negative for tinnitus.   Eyes: Negative for photophobia and visual disturbance.  Respiratory: Negative for choking and stridor.   Gastrointestinal: Negative for vomiting and blood in stool.  Genitourinary: Negative for hematuria and decreased urine volume.  Musculoskeletal: Negative for acute joint swelling Skin: Negative for color change and wound.  Neurological: Negative for tremors and numbness other than noted  Psychiatric/Behavioral: Negative for decreased concentration or  hyperactivity.       Objective:   Physical Exam BP 112/80  Pulse 60  Temp(Src) 97.3 F (36.3 C) (Oral)  Ht 5\' 9"  (1.753 m)  Wt 210 lb 2 oz (95.312 kg)  BMI 31.02 kg/m2  SpO2 96% VS noted,  Constitutional: Pt appears well-developed and well-nourished.  HENT: Head: NCAT.  Right Ear: External ear normal.  Left Ear: External ear normal.  Eyes: Conjunctivae and EOM are normal. Pupils are equal, round, and reactive to light.  Neck: Normal range of motion. Neck supple.  Cardiovascular: Normal rate and regular rhythm.   Pulmonary/Chest: Effort normal and breath sounds normal.  Abd:  Soft, NT, non-distended, + BS Spine nontender Neurological: Pt is alert. Not confused  Skin: Skin is warm. No erythema.  Psychiatric: Pt behavior is normal. Thought content normal.     Assessment & Plan:

## 2013-03-28 NOTE — Assessment & Plan Note (Signed)
stable overall by history and exam, recent data reviewed with pt, and pt to continue medical treatment as before,  to f/u any worsening symptoms or concerns BP Readings from Last 3 Encounters:  03/28/13 112/80  03/02/13 120/70  02/14/13 130/78

## 2013-03-28 NOTE — Assessment & Plan Note (Signed)
Unclear etiology, for UA and labs, also for CT abd r/o malignancy given the wt loss

## 2013-03-28 NOTE — Patient Instructions (Addendum)
Please continue all other medications as before, and refills have been done if requested. Please have the pharmacy call with any other refills you may need. Please go to the LAB in the Basement (turn left off the elevator) for the tests to be done today You will be contacted by phone if any changes need to be made immediately.  Otherwise, you will receive a letter about your results with an explanation, but please check with MyChart first. You will be contacted regarding the referral for: CT abdomen Thank you for enrolling in MyChart. Please follow the instructions below to securely access your online medical record. MyChart allows you to send messages to your doctor, view your test results, renew your prescriptions, schedule appointments, and more. Please return in 6 months, or sooner if needed

## 2013-03-31 ENCOUNTER — Ambulatory Visit (INDEPENDENT_AMBULATORY_CARE_PROVIDER_SITE_OTHER)
Admission: RE | Admit: 2013-03-31 | Discharge: 2013-03-31 | Disposition: A | Discharge status: Home / Self Care | Payer: Medicare Other | Source: Ambulatory Visit | Attending: Internal Medicine | Admitting: Internal Medicine

## 2013-03-31 DIAGNOSIS — R634 Abnormal weight loss: Secondary | ICD-10-CM

## 2013-03-31 DIAGNOSIS — R109 Unspecified abdominal pain: Secondary | ICD-10-CM | POA: Diagnosis not present

## 2013-03-31 DIAGNOSIS — K5730 Diverticulosis of large intestine without perforation or abscess without bleeding: Secondary | ICD-10-CM | POA: Diagnosis not present

## 2013-03-31 MED ORDER — IOHEXOL 300 MG/ML  SOLN
100.0000 mL | Freq: Once | INTRAMUSCULAR | Status: AC | PRN
  Administered 2013-03-31: 100 mL via INTRAVENOUS

## 2013-04-04 DIAGNOSIS — L608 Other nail disorders: Secondary | ICD-10-CM | POA: Diagnosis not present

## 2013-04-04 DIAGNOSIS — Z79899 Other long term (current) drug therapy: Secondary | ICD-10-CM | POA: Diagnosis not present

## 2013-05-04 DIAGNOSIS — Z79899 Other long term (current) drug therapy: Secondary | ICD-10-CM | POA: Diagnosis not present

## 2013-05-06 ENCOUNTER — Emergency Department (HOSPITAL_BASED_OUTPATIENT_CLINIC_OR_DEPARTMENT_OTHER): Payer: Medicare Other

## 2013-05-06 ENCOUNTER — Inpatient Hospital Stay (HOSPITAL_BASED_OUTPATIENT_CLINIC_OR_DEPARTMENT_OTHER)
Admission: EM | Admit: 2013-05-06 | Discharge: 2013-05-08 | DRG: 194 | Disposition: A | Discharge status: Home / Self Care | Payer: Medicare Other | Attending: Internal Medicine | Admitting: Internal Medicine

## 2013-05-06 ENCOUNTER — Encounter (HOSPITAL_BASED_OUTPATIENT_CLINIC_OR_DEPARTMENT_OTHER): Payer: Self-pay | Admitting: *Deleted

## 2013-05-06 DIAGNOSIS — I495 Sick sinus syndrome: Secondary | ICD-10-CM | POA: Diagnosis not present

## 2013-05-06 DIAGNOSIS — J189 Pneumonia, unspecified organism: Principal | ICD-10-CM

## 2013-05-06 DIAGNOSIS — R0602 Shortness of breath: Secondary | ICD-10-CM

## 2013-05-06 DIAGNOSIS — F3289 Other specified depressive episodes: Secondary | ICD-10-CM | POA: Diagnosis present

## 2013-05-06 DIAGNOSIS — R350 Frequency of micturition: Secondary | ICD-10-CM

## 2013-05-06 DIAGNOSIS — F329 Major depressive disorder, single episode, unspecified: Secondary | ICD-10-CM | POA: Diagnosis present

## 2013-05-06 DIAGNOSIS — N320 Bladder-neck obstruction: Secondary | ICD-10-CM

## 2013-05-06 DIAGNOSIS — M7989 Other specified soft tissue disorders: Secondary | ICD-10-CM

## 2013-05-06 DIAGNOSIS — N400 Enlarged prostate without lower urinary tract symptoms: Secondary | ICD-10-CM | POA: Diagnosis present

## 2013-05-06 DIAGNOSIS — K219 Gastro-esophageal reflux disease without esophagitis: Secondary | ICD-10-CM

## 2013-05-06 DIAGNOSIS — R112 Nausea with vomiting, unspecified: Secondary | ICD-10-CM

## 2013-05-06 DIAGNOSIS — I10 Essential (primary) hypertension: Secondary | ICD-10-CM | POA: Diagnosis present

## 2013-05-06 DIAGNOSIS — E785 Hyperlipidemia, unspecified: Secondary | ICD-10-CM

## 2013-05-06 DIAGNOSIS — M5412 Radiculopathy, cervical region: Secondary | ICD-10-CM

## 2013-05-06 DIAGNOSIS — I83893 Varicose veins of bilateral lower extremities with other complications: Secondary | ICD-10-CM

## 2013-05-06 DIAGNOSIS — M6282 Rhabdomyolysis: Secondary | ICD-10-CM

## 2013-05-06 DIAGNOSIS — K5730 Diverticulosis of large intestine without perforation or abscess without bleeding: Secondary | ICD-10-CM

## 2013-05-06 DIAGNOSIS — J309 Allergic rhinitis, unspecified: Secondary | ICD-10-CM

## 2013-05-06 DIAGNOSIS — Z860100 Personal history of colon polyps, unspecified: Secondary | ICD-10-CM

## 2013-05-06 DIAGNOSIS — R682 Dry mouth, unspecified: Secondary | ICD-10-CM

## 2013-05-06 DIAGNOSIS — E871 Hypo-osmolality and hyponatremia: Secondary | ICD-10-CM | POA: Diagnosis present

## 2013-05-06 DIAGNOSIS — Z0000 Encounter for general adult medical examination without abnormal findings: Secondary | ICD-10-CM

## 2013-05-06 DIAGNOSIS — Z7901 Long term (current) use of anticoagulants: Secondary | ICD-10-CM

## 2013-05-06 DIAGNOSIS — Z87891 Personal history of nicotine dependence: Secondary | ICD-10-CM

## 2013-05-06 DIAGNOSIS — J45909 Unspecified asthma, uncomplicated: Secondary | ICD-10-CM | POA: Diagnosis present

## 2013-05-06 DIAGNOSIS — I4891 Unspecified atrial fibrillation: Secondary | ICD-10-CM

## 2013-05-06 DIAGNOSIS — R10A0 Flank pain, unspecified side: Secondary | ICD-10-CM

## 2013-05-06 DIAGNOSIS — I4510 Unspecified right bundle-branch block: Secondary | ICD-10-CM | POA: Diagnosis present

## 2013-05-06 DIAGNOSIS — M129 Arthropathy, unspecified: Secondary | ICD-10-CM | POA: Diagnosis present

## 2013-05-06 DIAGNOSIS — R059 Cough, unspecified: Secondary | ICD-10-CM | POA: Diagnosis not present

## 2013-05-06 DIAGNOSIS — R0989 Other specified symptoms and signs involving the circulatory and respiratory systems: Secondary | ICD-10-CM

## 2013-05-06 DIAGNOSIS — R109 Unspecified abdominal pain: Secondary | ICD-10-CM | POA: Diagnosis not present

## 2013-05-06 DIAGNOSIS — R110 Nausea: Secondary | ICD-10-CM

## 2013-05-06 DIAGNOSIS — I369 Nonrheumatic tricuspid valve disorder, unspecified: Secondary | ICD-10-CM | POA: Diagnosis not present

## 2013-05-06 DIAGNOSIS — R05 Cough: Secondary | ICD-10-CM | POA: Diagnosis not present

## 2013-05-06 DIAGNOSIS — M545 Low back pain, unspecified: Secondary | ICD-10-CM

## 2013-05-06 DIAGNOSIS — R55 Syncope and collapse: Secondary | ICD-10-CM

## 2013-05-06 DIAGNOSIS — I498 Other specified cardiac arrhythmias: Secondary | ICD-10-CM

## 2013-05-06 DIAGNOSIS — E860 Dehydration: Secondary | ICD-10-CM | POA: Diagnosis not present

## 2013-05-06 DIAGNOSIS — I2510 Atherosclerotic heart disease of native coronary artery without angina pectoris: Secondary | ICD-10-CM

## 2013-05-06 DIAGNOSIS — R42 Dizziness and giddiness: Secondary | ICD-10-CM | POA: Diagnosis not present

## 2013-05-06 DIAGNOSIS — N318 Other neuromuscular dysfunction of bladder: Secondary | ICD-10-CM

## 2013-05-06 DIAGNOSIS — R9431 Abnormal electrocardiogram [ECG] [EKG]: Secondary | ICD-10-CM

## 2013-05-06 DIAGNOSIS — M542 Cervicalgia: Secondary | ICD-10-CM

## 2013-05-06 DIAGNOSIS — R0609 Other forms of dyspnea: Secondary | ICD-10-CM

## 2013-05-06 DIAGNOSIS — M7660 Achilles tendinitis, unspecified leg: Secondary | ICD-10-CM

## 2013-05-06 DIAGNOSIS — R5383 Other fatigue: Secondary | ICD-10-CM

## 2013-05-06 DIAGNOSIS — R5381 Other malaise: Secondary | ICD-10-CM

## 2013-05-06 DIAGNOSIS — M79604 Pain in right leg: Secondary | ICD-10-CM

## 2013-05-06 DIAGNOSIS — R634 Abnormal weight loss: Secondary | ICD-10-CM

## 2013-05-06 HISTORY — DX: Unspecified osteoarthritis, unspecified site: M19.90

## 2013-05-06 LAB — BASIC METABOLIC PANEL
BUN: 15 mg/dL (ref 6–23)
Calcium: 9.9 mg/dL (ref 8.4–10.5)
Creatinine, Ser: 1 mg/dL (ref 0.50–1.35)
GFR calc Af Amer: 86 mL/min — ABNORMAL LOW (ref >90)
GFR calc non Af Amer: 74 mL/min — ABNORMAL LOW (ref >90)
Glucose, Bld: 125 mg/dL — ABNORMAL HIGH (ref 70–99)
Potassium: 4.2 mEq/L (ref 3.5–5.1)

## 2013-05-06 LAB — CK TOTAL AND CKMB (NOT AT ARMC)
CK, MB: 2.1 ng/mL (ref 0.3–4.0)
CK, MB: 2.4 ng/mL (ref 0.3–4.0)
Relative Index: 1.2 (ref 0.0–2.5)
Relative Index: 1.2 (ref 0.0–2.5)
Total CK: 194 U/L (ref 7–232)

## 2013-05-06 LAB — URINALYSIS, ROUTINE W REFLEX MICROSCOPIC
Bilirubin Urine: NEGATIVE
Hgb urine dipstick: NEGATIVE
Protein, ur: NEGATIVE mg/dL
Urobilinogen, UA: 1 mg/dL (ref 0.0–1.0)

## 2013-05-06 LAB — CBC WITH DIFFERENTIAL/PLATELET
Basophils Absolute: 0 10*3/uL (ref 0.0–0.1)
Basophils Relative: 0 % (ref 0–1)
Eosinophils Absolute: 0 10*3/uL (ref 0.0–0.7)
MCH: 30 pg (ref 26.0–34.0)
MCHC: 34.5 g/dL (ref 30.0–36.0)
Neutro Abs: 11.2 10*3/uL — ABNORMAL HIGH (ref 1.7–7.7)
Neutrophils Relative %: 81 % — ABNORMAL HIGH (ref 43–77)
Platelets: 159 10*3/uL (ref 150–400)
RDW: 13.5 % (ref 11.5–15.5)

## 2013-05-06 LAB — TROPONIN I: Troponin I: <0.3 ng/mL (ref <0.30)

## 2013-05-06 MED ORDER — LATANOPROST 0.005 % OP SOLN
1.0000 [drp] | Freq: Every day | OPHTHALMIC | Status: DC
  Administered 2013-05-06 – 2013-05-08 (×3): 1 [drp] via OPHTHALMIC
  Filled 2013-05-06: qty 2.5

## 2013-05-06 MED ORDER — ACETAMINOPHEN 325 MG PO TABS
650.0000 mg | ORAL_TABLET | Freq: Once | ORAL | Status: AC
  Administered 2013-05-06: 650 mg via ORAL
  Filled 2013-05-06: qty 2

## 2013-05-06 MED ORDER — DEXTROSE 5 % IV SOLN
500.0000 mg | INTRAVENOUS | Status: DC
  Administered 2013-05-06: 500 mg via INTRAVENOUS
  Filled 2013-05-06 (×2): qty 500

## 2013-05-06 MED ORDER — GABAPENTIN 400 MG PO CAPS
800.0000 mg | ORAL_CAPSULE | Freq: Three times a day (TID) | ORAL | Status: DC
  Administered 2013-05-06 – 2013-05-08 (×7): 800 mg via ORAL
  Filled 2013-05-06 (×8): qty 2

## 2013-05-06 MED ORDER — AZITHROMYCIN 250 MG PO TABS
500.0000 mg | ORAL_TABLET | Freq: Once | ORAL | Status: AC
  Administered 2013-05-06: 500 mg via ORAL
  Filled 2013-05-06: qty 2

## 2013-05-06 MED ORDER — LORATADINE 10 MG PO TABS
10.0000 mg | ORAL_TABLET | Freq: Every day | ORAL | Status: DC
  Administered 2013-05-06 – 2013-05-08 (×3): 10 mg via ORAL
  Filled 2013-05-06 (×3): qty 1

## 2013-05-06 MED ORDER — FLUTICASONE PROPIONATE 50 MCG/ACT NA SUSP
2.0000 | Freq: Two times a day (BID) | NASAL | Status: DC
  Administered 2013-05-06 – 2013-05-08 (×5): 2 via NASAL
  Filled 2013-05-06: qty 16

## 2013-05-06 MED ORDER — SODIUM CHLORIDE 0.9 % IV BOLUS (SEPSIS)
1000.0000 mL | Freq: Once | INTRAVENOUS | Status: AC
  Administered 2013-05-06: 1000 mL via INTRAVENOUS

## 2013-05-06 MED ORDER — RIVAROXABAN 20 MG PO TABS
20.0000 mg | ORAL_TABLET | Freq: Every day | ORAL | Status: DC
  Administered 2013-05-06 – 2013-05-08 (×3): 20 mg via ORAL
  Filled 2013-05-06 (×3): qty 1

## 2013-05-06 MED ORDER — GABAPENTIN 800 MG PO TABS
800.0000 mg | ORAL_TABLET | Freq: Three times a day (TID) | ORAL | Status: DC
  Filled 2013-05-06 (×3): qty 1

## 2013-05-06 MED ORDER — PANTOPRAZOLE SODIUM 40 MG PO TBEC
40.0000 mg | DELAYED_RELEASE_TABLET | Freq: Every day | ORAL | Status: DC
  Administered 2013-05-06 – 2013-05-08 (×3): 40 mg via ORAL
  Filled 2013-05-06 (×3): qty 1

## 2013-05-06 MED ORDER — DEXTROSE 5 % IV SOLN
1.0000 g | Freq: Once | INTRAVENOUS | Status: AC
  Administered 2013-05-06: 1 g via INTRAVENOUS
  Filled 2013-05-06: qty 10

## 2013-05-06 MED ORDER — TAMSULOSIN HCL 0.4 MG PO CAPS
0.4000 mg | ORAL_CAPSULE | Freq: Every day | ORAL | Status: DC
  Administered 2013-05-06 – 2013-05-08 (×3): 0.4 mg via ORAL
  Filled 2013-05-06 (×3): qty 1

## 2013-05-06 MED ORDER — OXYBUTYNIN CHLORIDE ER 10 MG PO TB24
10.0000 mg | ORAL_TABLET | Freq: Every day | ORAL | Status: DC
  Administered 2013-05-06 – 2013-05-07 (×2): 10 mg via ORAL
  Filled 2013-05-06 (×3): qty 1

## 2013-05-06 MED ORDER — ALBUTEROL SULFATE (5 MG/ML) 0.5% IN NEBU: 2.5000 mg | INHALATION_SOLUTION | Freq: Four times a day (QID) | RESPIRATORY_TRACT | Status: DC | PRN

## 2013-05-06 MED ORDER — DEXTROSE 5 % IV SOLN
1.0000 g | INTRAVENOUS | Status: DC
  Administered 2013-05-06 – 2013-05-08 (×3): 1 g via INTRAVENOUS
  Filled 2013-05-06 (×3): qty 10

## 2013-05-06 MED ORDER — ACETAMINOPHEN 325 MG PO TABS
650.0000 mg | ORAL_TABLET | ORAL | Status: DC | PRN
  Administered 2013-05-06 – 2013-05-08 (×3): 650 mg via ORAL
  Filled 2013-05-06 (×2): qty 2

## 2013-05-06 MED ORDER — ONDANSETRON HCL 4 MG/2ML IJ SOLN
4.0000 mg | Freq: Four times a day (QID) | INTRAMUSCULAR | Status: DC | PRN
  Administered 2013-05-06: 4 mg via INTRAVENOUS

## 2013-05-06 MED ORDER — SODIUM CHLORIDE 0.9 % IV SOLN
INTRAVENOUS | Status: DC
  Administered 2013-05-06 – 2013-05-07 (×2): via INTRAVENOUS

## 2013-05-06 MED ORDER — ATORVASTATIN CALCIUM 40 MG PO TABS
40.0000 mg | ORAL_TABLET | Freq: Every day | ORAL | Status: DC
  Administered 2013-05-06 – 2013-05-08 (×3): 40 mg via ORAL
  Filled 2013-05-06 (×3): qty 1

## 2013-05-06 MED ORDER — TRAMADOL HCL 50 MG PO TABS
50.0000 mg | ORAL_TABLET | Freq: Four times a day (QID) | ORAL | Status: DC | PRN
  Filled 2013-05-06: qty 1

## 2013-05-06 MED ORDER — TERBINAFINE HCL 250 MG PO TABS
250.0000 mg | ORAL_TABLET | Freq: Every day | ORAL | Status: DC
  Administered 2013-05-06 – 2013-05-08 (×3): 250 mg via ORAL
  Filled 2013-05-06 (×3): qty 1

## 2013-05-06 MED ORDER — FLECAINIDE ACETATE 100 MG PO TABS
100.0000 mg | ORAL_TABLET | Freq: Two times a day (BID) | ORAL | Status: DC
  Administered 2013-05-06 – 2013-05-08 (×5): 100 mg via ORAL
  Filled 2013-05-06 (×6): qty 1

## 2013-05-06 NOTE — ED Provider Notes (Signed)
I saw and evaluated the patient, reviewed the resident's note and I agree with the findings and plan.   .Face to face Exam:  General:  Awake HEENT:  Atraumatic Resp:  Normal effort Abd:  Nondistended Neuro:No focal weakness   Nelia Shi, MD 05/06/13 [phone]

## 2013-05-06 NOTE — H&P (Signed)
History and Physical       Hospital Admission Note Date: 05/06/2013  Patient name: [name] Medical record number: [id number] Date of birth: 1941/02/07 Age: 72 y.o. Gender: male PCP: Oliver Barre, MD  Primary cardiologist: Dr. Johney Frame  Chief Complaint:  Dizzy and lightheaded for last 2 days  HPI: Patient is a 72 year old male with multiple medical problems including hypertension, hyperlipidemia,SSS, atrial fibrillation on chronic anticoagulation, GERD presented with persistent dizziness and lightheadedness for the last 2 days. 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 When he came back home in the evening, he felt dizzy and lightheaded. His symptoms persisted to today and he felt dizzy and lightheaded again coming down the stairs. He did feel somewhat short of breath, chills and coughing but no fevers. He felt better on resting. He denied any orthopnea or PND. He reports poor by mouth intake in the last 2-3 days. He denied any chest pain.    ER workup showed right upper lobe infiltrate on the chest x-ray, abnormal EKG with new inverted T waves in V2 to V5 and new right bundle branch block.  Review of Systems:  Constitutional: Denies fever,+  chills, diaphoresis, poor appetite and fatigue.  HEENT: Denies photophobia, eye pain, redness, hearing loss, ear pain, congestion, sore throat, rhinorrhea, sneezing, mouth sores, trouble swallowing, neck pain, neck stiffness and tinnitus.   Respiratory:  please see history of present illness, no wheezing or productive phlegm  Cardiovascular: Denies chest pain, palpitations and leg swelling. + dizziness and lightheadedness  Gastrointestinal: Denies nausea, vomiting, abdominal pain, diarrhea, constipation, blood in stool and abdominal distention.  Genitourinary: Denies dysuria, urgency, frequency,  hematuria, flank pain and difficulty urinating.  Musculoskeletal: Denies myalgias, back pain, joint swelling, arthralgias and gait problem.  Skin: Denies pallor, rash and wound.  Neurological: dizziness and lightheadedness please see history of present illness  Hematological: Denies adenopathy. Easy bruising, personal or family bleeding history  Psychiatric/Behavioral: Denies suicidal ideation, mood changes, confusion, nervousness, sleep disturbance and agitation  Past Medical History: Past Medical History  Diagnosis Date  . Hypertension   . Hyperlipidemia   . BPH (benign prostatic hyperplasia)   . Diverticulosis of colon (without mention of hemorrhage)   . Esophageal reflux   . Abdominal pain, epigastric   . Depressive disorder, not elsewhere classified   . Lumbago   . Allergic rhinitis, cause unspecified   . Sick sinus syndrome     s/p PPM Conservation officer, historic buildings) by [employer]  . Persistent atrial fibrillation   . Chronic anticoagulation     xarelto  . High risk medication use   . Paralyzed hemidiaphragm     LEFT  . Coronary artery calcification seen on CAT scan 12/26/2012  . Arthritis    Past Surgical History  Procedure Laterality Date  . Insert / replace / remove pacemaker  05/2009    Medications: Prior to Admission medications   Medication Sig Start Date End Date Taking? Authorizing Provider  amLODipine (NORVASC) 10 MG tablet TAKE 1 TABLET (10 MG TOTAL) BY MOUTH DAILY. 12/31/12   Corwin Levins, MD  atorvastatin (LIPITOR) 40 MG tablet TAKE 1 TABLET (40 MG TOTAL) BY MOUTH DAILY. 12/19/12   Corwin Levins, MD  cetirizine (ZYRTEC) 10 MG tablet Take 1 tablet (10 mg total) by mouth daily. 12/24/11 12/23/12  Corwin Levins, MD  fexofenadine (ALLEGRA) 180 MG tablet Take 1  tablet (180 mg total) by mouth daily. 06/22/12 06/22/13  Corwin Levins, MD  flecainide (TAMBOCOR) 100 MG tablet TAKE 1 TABLET (100 MG TOTAL) BY MOUTH 2 (TWO) TIMES DAILY. 12/31/12   Corwin Levins, MD  fluticasone (FLONASE) 50 MCG/ACT nasal  spray Place 2 sprays into the nose 2 (two) times daily. 02/10/13   Corwin Levins, MD  gabapentin (NEURONTIN) 800 MG tablet TAKE 1 TABLET (800 MG TOTAL) BY MOUTH 3 (THREE) TIMES DAILY. 02/08/13   Corwin Levins, MD  HYDROcodone-homatropine [facility]) 5-1.5 MG/5ML syrup Take 5 mLs by mouth every 6 (six) hours as needed for cough. 02/10/13   Corwin Levins, MD  latanoprost (XALATAN) 0.005 % ophthalmic solution  04/22/12   Historical Provider, MD  lisinopril (PRINIVIL,ZESTRIL) 10 MG tablet Take 1 tablet (10 mg total) by mouth daily. 12/24/11 12/23/12  Corwin Levins, MD  losartan (COZAAR) 50 MG tablet Take 1 tablet (50 mg total) by mouth daily. 12/26/12   Corwin Levins, MD  omeprazole (PRILOSEC) 20 MG capsule TAKE ONE CAPSULE BY MOUTH EVERY DAY 05/18/12   Corwin Levins, MD  oxybutynin (DITROPAN-XL) 10 MG 24 hr tablet TAKE 1 TABLET (10 MG TOTAL) BY MOUTH DAILY. 12/19/12   Corwin Levins, MD  Tamsulosin HCl (FLOMAX) 0.4 MG CAPS TAKE 1 CAPSULE (0.4 MG TOTAL) BY MOUTH DAILY. 12/19/12   Corwin Levins, MD  terbinafine (LAMISIL) 250 MG tablet Take 1 tablet (250 mg total) by mouth daily. 03/28/13   Corwin Levins, MD  tiotropium (SPIRIVA HANDIHALER) 18 MCG inhalation capsule Place 1 capsule (18 mcg total) into inhaler and inhale daily. 02/14/13   Newt Lukes, MD  traMADol (ULTRAM) 50 MG tablet TAKE 1 TABLET (50 MG TOTAL) BY MOUTH EVERY 6 (SIX) HOURS AS NEEDED FOR PAIN. 03/13/13   Corwin Levins, MD  vardenafil (LEVITRA) 20 MG tablet Take 1 tablet (20 mg total) by mouth daily as needed for erectile dysfunction. 12/24/11 12/23/12  Corwin Levins, MD  XARELTO 20 MG TABS TAKE 1 TABLET BY MOUTH EVERY DAY 01/04/13   Corwin Levins, MD    Allergies:  No Known Allergies  Social History:  reports that he quit smoking about 34 years ago. His smoking use included Cigarettes. He has a 12.5 pack-year smoking history. He has quit using smokeless tobacco. His smokeless tobacco use included Chew. He reports that he does not drink alcohol or use illicit  drugs.  Family History: Family History  Problem Relation Age of Onset  . Hypertension Father   . Lung cancer Father   . Cancer Father   . Asthma Sister   . Hypertension Sister   . Arthritis Paternal Grandmother   . Diabetes Mother   . Hypertension Mother   . Hypertension Brother   . Hypertension Daughter   . Hypertension Son     Physical Exam: Blood pressure 130/84, pulse 76, temperature 99.8 F (37.7 C), temperature source Oral, height 5\' 9"  (1.753 m), weight 94.802 kg (209 lb), SpO2 99.00%. BP at the time of my examination 119/99  General: Alert, awake, oriented x3, in no acute distress. HEENT: normocephalic, atraumatic, anicteric sclera, pink conjunctiva, pupils equal and reactive to light and accomodation, oropharynx clear, dry mucosal membranes  Neck: supple, no masses or lymphadenopathy, no goiter, no bruits  Heart: Regular rate and rhythm, without murmurs, rubs or gallops. Lungs: Clear to auscultation bilaterally, no wheezing, rales or rhonchi. Abdomen: Soft, nontender, nondistended, positive bowel sounds, no masses. Extremities: No  clubbing, cyanosis or edema with positive pedal pulses. Neuro: Grossly intact, no focal neurological deficits, strength 5/5 upper and lower extremities bilaterally Psych: alert and oriented x 3, normal mood and affect Skin: no rashes or lesions, warm and dry   LABS on Admission:  Basic Metabolic Panel:  Recent Labs Lab 05/06/13 0800  NA 133*  K 4.2  CL 96  CO2 24  GLUCOSE 125*  BUN 15  CREATININE 1.00  CALCIUM 9.9   CBC:  Recent Labs Lab 05/06/13 0800  WBC 13.8*  NEUTROABS 11.2*  HGB 16.1  HCT 46.7  MCV 87.1  PLT 159   Cardiac Enzymes:  Recent Labs Lab 05/06/13 0800  TROPONINI <0.30   BNP: No components found with this basename: POCBNP,  CBG: No results found for this basename: GLUCAP,  in the last 168 hours   Radiological Exams on Admission: Dg Chest 2 View  05/06/2013   *RADIOLOGY REPORT*  Clinical Data:  Dizziness and cough.  CHEST - 2 VIEW  Comparison: 02/13/2013  Findings: There is a new airspace infiltrate of the right upper lobe likely representing pneumonia.  Stable scarring and chronic atelectasis present at both lung bases.  No overt edema or significant pleural fluid identified.  Heart size is stable.  There is stable appearance to a dual chamber pacemaker.  IMPRESSION: New right upper lobe infiltrate.   Original Report Authenticated By: Irish Lack, M.D.    Assessment/Plan Principal Problem:   CAP (community acquired pneumonia) - Will admit to telemetry, placed on IV Zithromax and Rocephin,  - obtain blood cultures, urine Legionella antigen, urine strep antigen - Placed on IV fluids   Active Problems:   HYPERTENSION - Patient has not taken any of his medications today, BP is still somewhat soft, place on IV hydration    Atrial fibrillation - Continue flecanide, xarelto    Near syncope with  abnormal (EKG)new inverted T waves in V2 to V5 and new right bundle branch block. - Patient denies any chest pain, obtain serial cardiac enzymes, gentle hydration  - Obtain 2-D echo, cardiology consultation, patient follows Dr. Johney Frame   DVT prophylaxis:  currently on xarelto   CODE STATUS:  Full code status   Further plan will depend as patient's clinical course evolves and further radiologic and laboratory data become available.   Time Spent on Admission: 1 hour  Sandeep Radell M.D. [facility] 05/06/2013, 11:44 AM Pager: [phone]  If 7PM-7AM, please contact night-coverage [email or web address] Password [email or web address]

## 2013-05-06 NOTE — Consult Note (Signed)
CARDIOLOGY CONSULT NOTE   Patient ID: [name] MRN: [id number] DOB/AGE: [birth year] 72 y.o.  Admit date: 05/06/2013  Primary Physician   Oliver Barre, MD Primary Cardiologist   JA Reason for Consultation   Near-syncope with ECG changes  [name] is a 72 y.o. male with no history of CAD.  He has atrial fib and SSS, s/p [employer] PPM. He was admitted today with near-syncope, PNA and ECG concerning for new changes. Cardiology asked to evaluate him.  [name] had an illness in March, April and May for which he was seen several times by Dr. Jonny Ruiz as well as Dr. Felicity Coyer and Dr. Delton Coombes. He finally improved and felt like he was almost back to normal.   3 days ago, he was working outside and got very hot. He was extremely diaphoretic. He had an episode of presyncope. He was able to sit and rest and eventually get home. He continued to have dizziness that seems generally orthostatic in nature. He did not have any chest pain but he did have some increased shortness of breath over his baseline dyspnea on exertion. He did not have any palpitations. He did not have any nausea or vomiting. Yesterday, his symptoms were a little bit better. He was coughing but the cough is nonproductive. He is not aware of any fevers. He had no lower extremity edema and denies orthopnea or PND. Today, when he got out of bed he had another episode of severe presyncope. He went to [facility] and there was given IV fluids and antibiotics. He was transferred to [facility] and admitted. Currently, he he denies dizziness and is feeling some better. He is still weak and has dyspnea on exertion.   Past Medical History  Diagnosis Date  . Hypertension   . Hyperlipidemia   . BPH (benign prostatic hyperplasia)   . Diverticulosis of colon (without mention of hemorrhage)   . Esophageal reflux   . Abdominal pain, epigastric   . Depressive disorder, not elsewhere classified   . Lumbago   . Allergic rhinitis, cause  unspecified   . Sick sinus syndrome July 2010    s/p PPM Conservation officer, historic buildings) by [employer]  . Persistent atrial fibrillation   . Chronic anticoagulation     xarelto  . High risk medication use   . Paralyzed hemidiaphragm     LEFT  . Coronary artery calcification seen on CAT scan 12/26/2012  . Arthritis      Past Surgical History  Procedure Laterality Date  . Insert / replace / remove pacemaker  05/2009     [employer] ALTRUA 60  (serial number [id number]) dual-chamber pacemaker    No Known Allergies  I have reviewed the patient's current medications . atorvastatin  40 mg Oral q1800  . azithromycin  500 mg Intravenous Q24H  . cefTRIAXone (ROCEPHIN)  IV  1 g Intravenous Q24H  . flecainide  100 mg Oral Q12H  . fluticasone  2 spray Each Nare BID  . gabapentin  800 mg Oral TID  . latanoprost  1 drop Both Eyes Daily  . loratadine  10 mg Oral Daily  . oxybutynin  10 mg Oral QHS  . pantoprazole  40 mg Oral Daily  . Rivaroxaban  20 mg Oral Q supper  . tamsulosin  0.4 mg Oral Daily  . terbinafine  250 mg Oral Daily   . sodium chloride     albuterol, traMADol  Prior to Admission medications   Medication  Sig Start Date End Date Taking? Authorizing Provider  amLODipine (NORVASC) 10 MG tablet Take 10 mg by mouth daily.   Yes Historical Provider, MD  atorvastatin (LIPITOR) 40 MG tablet Take 40 mg by mouth daily.   Yes Historical Provider, MD  cetirizine (ZYRTEC) 10 MG tablet Take 10 mg by mouth daily.   Yes Historical Provider, MD  flecainide (TAMBOCOR) 100 MG tablet Take 100 mg by mouth 2 (two) times daily.   Yes Historical Provider, MD  fluticasone (FLONASE) 50 MCG/ACT nasal spray Place 2 sprays into the nose daily.   Yes Historical Provider, MD  gabapentin (NEURONTIN) 800 MG tablet Take 800 mg by mouth 3 (three) times daily.   Yes Historical Provider, MD  latanoprost (XALATAN) 0.005 % ophthalmic solution Place 1 drop into both eyes daily.   Yes Historical Provider, MD  lisinopril  (PRINIVIL,ZESTRIL) 10 MG tablet Take 10 mg by mouth daily.   Yes Corwin Levins, MD  losartan (COZAAR) 50 MG tablet Take 1 tablet (50 mg total) by mouth daily. 12/26/12  Yes Corwin Levins, MD  naproxen sodium (ANAPROX) 220 MG tablet Take 440 mg by mouth daily.   Yes Historical Provider, MD  omeprazole (PRILOSEC) 20 MG capsule Take 20 mg by mouth daily.   Yes Historical Provider, MD  oxybutynin (DITROPAN-XL) 10 MG 24 hr tablet Take 10 mg by mouth daily.   Yes Historical Provider, MD  Rivaroxaban (XARELTO) 20 MG TABS Take 20 mg by mouth daily.   Yes Historical Provider, MD  tamsulosin (FLOMAX) 0.4 MG CAPS Take 0.4 mg by mouth daily.   Yes Historical Provider, MD  terbinafine (LAMISIL) 250 MG tablet Take 1 tablet (250 mg total) by mouth daily. 03/28/13  Yes Corwin Levins, MD  traMADol (ULTRAM) 50 MG tablet Take 50 mg by mouth every 6 (six) hours as needed for pain.   Yes Historical Provider, MD     History   Social History  . Marital Status: Married    Spouse Name: N/A    Number of Children: N/A  . Years of Education: N/A   Occupational History  . retired    Social History Main Topics  . Smoking status: Former Smoker -- 0.50 packs/day for 25 years    Types: Cigarettes    Quit date: 11/16/1978  . Smokeless tobacco: Former Neurosurgeon    Types: Chew  . Alcohol Use: No  . Drug Use: No  . Sexually Active: Not on file   Other Topics Concern  . 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     Family Status  Relation Status Death Age  . Mother Deceased   . Father Deceased    Family History  Problem Relation Age of Onset  . Hypertension Father   . Lung cancer Father   . Cancer Father   . Asthma Sister   . Hypertension Sister   . Arthritis Paternal Grandmother   . Diabetes Mother   . Hypertension Mother   . Hypertension Brother   . Hypertension Daughter   . Hypertension Son      ROS:  Full 14 point review of systems complete and found to be negative unless  listed above.  Physical Exam: Blood pressure 119/99, pulse 85, temperature 98.9 F (37.2 C), temperature source Oral, resp. rate 20, height 5\' 9"  (1.753 m), weight 199 lb 15.3 oz (90.7 kg), SpO2 97.00%.  General: Well developed, well nourished, male in no  acute distress Head: Eyes PERRLA, No xanthomas.   Normocephalic and atraumatic, oropharynx without edema or exudate. Dentition: Poor Lungs: Slightly decreased breath sounds bases Heart: HRRR S1 S2, no rub/gallop, 2/6 murmur. pulses are 2+ all 4 extrem.   Neck: No carotid bruits. No lymphadenopathy.  JVD not elevated Abdomen: Bowel sounds present, abdomen soft and non-tender without masses or hernias noted. Msk:  No spine or cva tenderness. No weakness, no joint deformities or effusions. Extremities: No clubbing or cyanosis. No edema.  Neuro: Alert and oriented X 3. No focal deficits noted. Psych:  Good affect, responds appropriately Skin: No rashes or lesions noted.  Labs:   Lab Results  Component Value Date   WBC 13.8* 05/06/2013   HGB 16.1 05/06/2013   HCT 46.7 05/06/2013   MCV 87.1 05/06/2013   PLT 159 05/06/2013   No results found for this basename: INR,  in the last 72 hours   Recent Labs Lab 05/06/13 0800  NA 133*  K 4.2  CL 96  CO2 24  BUN 15  CREATININE 1.00  CALCIUM 9.9  GLUCOSE 125*    Recent Labs  05/06/13 0800  TROPONINI <0.30   Lab Results  Component Value Date   CHOL 156 12/26/2012   HDL 58.70 12/26/2012   LDLCALC 83 12/26/2012   TRIG 73.0 12/26/2012   TSH  Date/Time Value Range Status  03/28/2013 10:31 AM 1.18  0.35 - 5.50 uIU/mL Final    Echo: 09/08/2011 Study Conclusions - Left ventricle: The cavity size was mildly dilated. Systolic function was normal. The estimated ejection fraction was in the range of 50% to 55%. Doppler parameters are consistent with abnormal left ventricular relaxation (grade 1 diastolic dysfunction). - Right atrium: The atrium was mildly dilated. - Atrial septum: No  defect or patent foramen ovale was identified.  ECG: 06-May-2013 07:22:31  Normal sinus rhythm Right bundle branch block Left anterior fascicular block ** Bifascicular block ** T wave abnormality, consider lateral ischemia Vent. rate 67 BPM PR interval 168 ms QRS duration 154 ms QT/QTc 416/439 ms P-R-T axes 77 -54 47   Radiology:  Dg Chest 2 View 05/06/2013   *RADIOLOGY REPORT*  Clinical Data: Dizziness and cough.  CHEST - 2 VIEW  Comparison: 02/13/2013  Findings: There is a new airspace infiltrate of the right upper lobe likely representing pneumonia.  Stable scarring and chronic atelectasis present at both lung bases.  No overt edema or significant pleural fluid identified.  Heart size is stable.  There is stable appearance to a dual chamber pacemaker.  IMPRESSION: New right upper lobe infiltrate.   Original Report Authenticated By: Irish Lack, M.D.    ASSESSMENT AND PLAN:   The patient was seen today by Dr Eden Emms, the patient evaluated and the data reviewed.  Principal Problem:   CAP (community acquired pneumonia) - per internal medicine Active Problems:   HYPERTENSION - check orthostatics   Atrial fibrillation - currently sinus rhythm, with a right bundle branch block is new. Do a pacemaker interrogation   Near syncope - check orthostatics and interrogate his pacemaker, hydrate if orthostatics are positive.   abnormal (EKG) - see above   Signed: Theodore Demark, PA-C 05/06/2013 1:07 PM Beeper [phone]  Co-Sign MD  Patient examined chart reviewed. Doubt specific cardiac etiology .  Hold amlodipine for orthostasis.  Interogate pacer to make sure no brady arrhythmia as telem shows NSR and no afib.  Continue anticoagulation with xarelto  Charlton Haws

## 2013-05-06 NOTE — ED Provider Notes (Signed)
History     CSN: [id number] Arrival date & time 05/06/13  0865 First MD Initiated Contact with Patient 05/06/13 0720      Chief Complaint  Patient presents with  . Dizziness   HPI  Normal self this week planning on going fishing this weekend.  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  Went to sit on riding lawnmower and got weak throughout legs. Got lightheaded, dizzy at that time. Sweaty (90 degrees outside at time). No chest pain. Slight nausea. Short of breath. No arm or neck pain. Symptoms persisted throughout the whole day and through Friday.   This morning got lightheaded and sweaty coming down the steps. No fevers/recently/vomiting. Chilled at night. Feels better when he is resting. Symptoms can be worsened by exertion. He feels like he is having a harder time getting around.  Denies orthopnea or PND. Very slight frontal headache (not bothering patient). No blurry vision. Poor PO since he has felt ill.   Past Medical History  Diagnosis Date  . Hypertension   . Hyperlipidemia   . BPH (benign prostatic hyperplasia)   . Diverticulosis of colon (without mention of hemorrhage)   . Esophageal reflux   . Abdominal pain, epigastric   . Depressive disorder, not elsewhere classified   . Lumbago   . Allergic rhinitis, cause unspecified   . Sick sinus syndrome     s/p PPM Conservation officer, historic buildings) by [employer]  . Persistent atrial fibrillation   . Chronic anticoagulation     xarelto  . High risk medication use   . Paralyzed hemidiaphragm     LEFT  . Coronary artery calcification seen on CAT scan 12/26/2012  . Arthritis     Past Surgical History  Procedure Laterality Date  . Insert / replace / remove pacemaker  05/2009    Family History  Problem Relation Age of Onset  . Hypertension Father   . Lung cancer Father   . Cancer Father   . Asthma Sister   . Hypertension Sister   . Arthritis Paternal Grandmother   . Diabetes Mother   . Hypertension Mother   .  Hypertension Brother   . Hypertension Daughter   . Hypertension Son     History  Substance Use Topics  . Smoking status: Former Smoker -- 0.50 packs/day for 25 years    Types: Cigarettes    Quit date: 11/16/1978  . Smokeless tobacco: Former Neurosurgeon    Types: Chew  . Alcohol Use: No    Review of Systems A full 10 point review of symptoms was performed and was negative except as noted in HPI.   Allergies  Review of patient's allergies indicates no known allergies.  Home Medications   Current Outpatient Rx  Name  Route  Sig  Dispense  Refill  . amLODipine (NORVASC) 10 MG tablet      TAKE 1 TABLET (10 MG TOTAL) BY MOUTH DAILY.   90 tablet   3   . atorvastatin (LIPITOR) 40 MG tablet      TAKE 1 TABLET (40 MG TOTAL) BY MOUTH DAILY.   90 tablet   2   . EXPIRED: cetirizine (ZYRTEC) 10 MG tablet   Oral   Take 1 tablet (10 mg total) by mouth daily.   90 tablet   3   . fexofenadine (ALLEGRA) 180 MG tablet   Oral   Take 1 tablet (180 mg total) by mouth daily.   90 tablet  3   . flecainide (TAMBOCOR) 100 MG tablet      TAKE 1 TABLET (100 MG TOTAL) BY MOUTH 2 (TWO) TIMES DAILY.   180 tablet   3   . fluticasone (FLONASE) 50 MCG/ACT nasal spray   Nasal   Place 2 sprays into the nose 2 (two) times daily.   16 g   5   . gabapentin (NEURONTIN) 800 MG tablet      TAKE 1 TABLET (800 MG TOTAL) BY MOUTH 3 (THREE) TIMES DAILY.   90 tablet   5   . HYDROcodone-homatropine (HYCODAN) 5-1.5 MG/5ML syrup   Oral   Take 5 mLs by mouth every 6 (six) hours as needed for cough.   120 mL   1   . latanoprost (XALATAN) 0.005 % ophthalmic solution               . EXPIRED: lisinopril (PRINIVIL,ZESTRIL) 10 MG tablet   Oral   Take 1 tablet (10 mg total) by mouth daily.   90 tablet   3   . losartan (COZAAR) 50 MG tablet   Oral   Take 1 tablet (50 mg total) by mouth daily.   90 tablet   3   . omeprazole (PRILOSEC) 20 MG capsule      TAKE ONE CAPSULE BY MOUTH EVERY DAY    90 capsule   3   . oxybutynin (DITROPAN-XL) 10 MG 24 hr tablet      TAKE 1 TABLET (10 MG TOTAL) BY MOUTH DAILY.   90 tablet   2   . Tamsulosin HCl (FLOMAX) 0.4 MG CAPS      TAKE 1 CAPSULE (0.4 MG TOTAL) BY MOUTH DAILY.   90 capsule   2   . terbinafine (LAMISIL) 250 MG tablet   Oral   Take 1 tablet (250 mg total) by mouth daily.      2   . tiotropium (SPIRIVA HANDIHALER) 18 MCG inhalation capsule   Inhalation   Place 1 capsule (18 mcg total) into inhaler and inhale daily.   30 capsule   12   . traMADol (ULTRAM) 50 MG tablet      TAKE 1 TABLET (50 MG TOTAL) BY MOUTH EVERY 6 (SIX) HOURS AS NEEDED FOR PAIN.   60 tablet   2   . EXPIRED: vardenafil (LEVITRA) 20 MG tablet   Oral   Take 1 tablet (20 mg total) by mouth daily as needed for erectile dysfunction.   10 tablet   11   . XARELTO 20 MG TABS      TAKE 1 TABLET BY MOUTH EVERY DAY   90 tablet   3     BP 130/84  Pulse 76  Temp(Src) 99.8 F (37.7 C) (Oral)  Ht 5\' 9"  (1.753 m)  Wt 209 lb (94.802 kg)  BMI 30.85 kg/m2  SpO2 99%  Physical Exam  Constitutional: He is oriented to person, place, and time. He appears well-developed and well-nourished. No distress.  HENT:  Head: Normocephalic and atraumatic.  Slightly dry mucus membranes.   Eyes: EOM are normal. Pupils are equal, round, and reactive to light.  Neck: Normal range of motion. Neck supple.  Cardiovascular: Normal rate and regular rhythm.  Exam reveals no gallop and no friction rub.   No murmur heard. Pulmonary/Chest: Effort normal and breath sounds normal. He has no wheezes. He has no rales.  Abdominal: Soft. Bowel sounds are normal. There is no tenderness. There is no rebound.  Musculoskeletal: Normal range  of motion. He exhibits no edema.  Neurological: He is alert and oriented to person, place, and time.    Date: 05/06/2013  Rate: 67  Rhythm: normal sinus rhythm  QRS Axis: left  Intervals: QRS prolonged  ST/T Wave abnormalities: t wave  inversions in v2-v5 (previously only noted in v1)  Conduction Disutrbances:right bundle branch block new   Narrative Interpretation: new inverted t waves in anterolateral leads as well as RBB.   Old EKG Reviewed: changes noted  ED Course  Procedures (including critical care time)  Labs Reviewed  BASIC METABOLIC PANEL - Abnormal; Notable for the following:    Sodium 133 (*)    Glucose, Bld 125 (*)    GFR calc non Af Amer 74 (*)    GFR calc Af Amer 86 (*)    All other components within normal limits  CBC WITH DIFFERENTIAL - Abnormal; Notable for the following:    WBC 13.8 (*)    Neutrophils Relative % 81 (*)    Neutro Abs 11.2 (*)    Lymphocytes Relative 7 (*)    Monocytes Absolute 1.7 (*)    All other components within normal limits  TROPONIN I   Dg Chest 2 View  05/06/2013   *RADIOLOGY REPORT*  Clinical Data: Dizziness and cough.  CHEST - 2 VIEW  Comparison: 02/13/2013  Findings: There is a new airspace infiltrate of the right upper lobe likely representing pneumonia.  Stable scarring and chronic atelectasis present at both lung bases.  No overt edema or significant pleural fluid identified.  Heart size is stable.  There is stable appearance to a dual chamber pacemaker.  IMPRESSION: New right upper lobe infiltrate.   Original Report Authenticated By: Irish Lack, M.D.   1. Community acquired pneumonia    MDM  72 year old male with hypertension, hyperlipidemia, sick sinus syndrome with pacemaker, paroxysmal a fib, paralyzed hemidiaphragm presenting with lightheadedness, lower extremity weakness, diaphoresis with exertion and worsened shortness of breath found to have RUL PNA with WBC 13.8k and EKG changes. On xarelto so doubt PE. DId have a lexiscan myoview in October 2012 negative for ischemia and echo at that time with normal LV function. Unable to interrogate pacemaker here. Hyponatremia noted and patient with mildly dry mucus membranes on exam so will give 1L NS bolus.   EKG  changes as above with new inverted t waves in v2-v5 and new RBB. Patient should be ruled out and consider consultation with cardiology or outpatient follow up.  Found to have right upper lobe infiltrate on CXR-started on rocephin and azithromycin. [facility]. Dr. Joseph Art has accepted patient.   Shelva Majestic, MD 05/06/13 [phone]

## 2013-05-06 NOTE — Progress Notes (Signed)
PT Cancellation Note  Patient Details Name: [name] MRN: [id number] DOB: 07/24/1941   Cancelled Treatment:    Reason Eval/Treat Not Completed: Patient not medically ready; Pt with +abnormal cardiac changes on EEG testing, will await troponin results for downward trend prior to initiating evaluation.   Fabio Asa 05/06/2013, 2:04 PM

## 2013-05-06 NOTE — ED Notes (Signed)
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

## 2013-05-07 DIAGNOSIS — J189 Pneumonia, unspecified organism: Secondary | ICD-10-CM | POA: Diagnosis not present

## 2013-05-07 DIAGNOSIS — I4891 Unspecified atrial fibrillation: Secondary | ICD-10-CM | POA: Diagnosis not present

## 2013-05-07 DIAGNOSIS — I495 Sick sinus syndrome: Secondary | ICD-10-CM | POA: Diagnosis not present

## 2013-05-07 LAB — URINE CULTURE: Culture: NO GROWTH

## 2013-05-07 LAB — LEGIONELLA ANTIGEN, URINE: Legionella Antigen, Urine: NEGATIVE

## 2013-05-07 LAB — BASIC METABOLIC PANEL
Calcium: 8.5 mg/dL (ref 8.4–10.5)
GFR calc Af Amer: >90 mL/min (ref >90)
GFR calc non Af Amer: 86 mL/min — ABNORMAL LOW (ref >90)
Glucose, Bld: 97 mg/dL (ref 70–99)
Sodium: 133 mEq/L — ABNORMAL LOW (ref 135–145)

## 2013-05-07 LAB — CBC
MCH: 29.5 pg (ref 26.0–34.0)
MCHC: 33.4 g/dL (ref 30.0–36.0)
Platelets: 149 10*3/uL — ABNORMAL LOW (ref 150–400)
RBC: 4.82 MIL/uL (ref 4.22–5.81)

## 2013-05-07 MED ORDER — AZITHROMYCIN 500 MG PO TABS
500.0000 mg | ORAL_TABLET | ORAL | Status: DC
Start: 2013-05-07 — End: 2013-05-08
  Administered 2013-05-07 – 2013-05-08 (×2): 500 mg via ORAL
  Filled 2013-05-07 (×2): qty 1

## 2013-05-07 NOTE — Evaluation (Signed)
Physical Therapy Evaluation Patient Details Name: [name] MRN: [id number] DOB: 03-10-1941 Today's Date: 05/07/2013 Time: 8295-6213 PT Time Calculation (min): 12 min  PT Assessment / Plan / Recommendation Clinical Impression  Patient admitted with PNA. Patient independent for all mobility, no further PT needs identified, will sign off.    PT Assessment  Patent does not need any further PT services    Follow Up Recommendations  No PT follow up    Does the patient have the potential to tolerate intense rehabilitation      Barriers to Discharge        Equipment Recommendations  None recommended by PT    Recommendations for Other Services     Frequency      Precautions / Restrictions Precautions Precautions: None   Pertinent Vitals/Pain No/denies pain      Mobility  Bed Mobility Bed Mobility: Supine to Sit Supine to Sit: 7: Independent Transfers Transfers: Sit to Stand;Stand to Sit Sit to Stand: 7: Independent;From bed Stand to Sit: 7: Independent;To bed Ambulation/Gait Ambulation/Gait Assistance: 7: Independent Ambulation Distance (Feet): 150 Feet Assistive device: None Gait Pattern: Within Functional Limits    Exercises     PT Diagnosis:    PT Problem List:   PT Treatment Interventions:     PT Goals    Visit Information  Last PT Received On: 05/07/13 Assistance Needed: +1    Subjective Data  Subjective: no concerns Patient Stated Goal: get better   Prior Functioning  Home Living Lives With: Spouse Available Help at Discharge: Family Type of Home: House Home Layout: Multi-level Alternate Level Stairs-Number of Steps: flight Alternate Level Stairs-Rails: Right Home Adaptive Equipment: None Prior Function Level of Independence: Independent Communication Communication: No difficulties    Cognition  Cognition Arousal/Alertness: Awake/alert Behavior During Therapy: WFL for tasks assessed/performed Overall Cognitive Status: Within  Functional Limits for tasks assessed    Extremity/Trunk Assessment Right Upper Extremity Assessment RUE ROM/Strength/Tone: Within functional levels Left Upper Extremity Assessment LUE ROM/Strength/Tone: Within functional levels Right Lower Extremity Assessment RLE ROM/Strength/Tone: Within functional levels Left Lower Extremity Assessment LLE ROM/Strength/Tone: Within functional levels Trunk Assessment Trunk Assessment: Normal   Balance Balance Balance Assessed:  (no apparent balance deficits)  End of Session PT - End of Session Equipment Utilized During Treatment: Gait belt Activity Tolerance: Patient tolerated treatment well Patient left: in bed;with call bell/phone within reach;with family/visitor present  GP     Olivia Canter, [place] [phone] 05/07/2013, 2:02 PM

## 2013-05-07 NOTE — Progress Notes (Signed)
[facility]                                                                                Patient Demographics  [name], is a 72 y.o. male, DOB - February 07, 1941, GNF:[id number], QIO:[id number]  Admit date - 05/06/2013  Admitting Physician [name] Dallas, MD  Outpatient Primary MD for the patient is [name] Barre, MD  LOS - 1   Chief Complaint  Patient presents with  . Dizziness        Assessment & Plan    1.Community acquired pneumonia. He already feels much better, follow cultures which are pending, responded well to empiric azithromycin and Rocephin which will be continued. Gentle IV fluids for hydration. Repeat 2 view chest x-ray in 7-10 days post discharge to document resolution.   2. History of atrial fibrillation. New right bundle branch block. Mild dizziness due to dehydration- Ruled out for a mother with serial negative troponins, seen by cardiology, echogram pending, feels better after IV fluids, Home dose statin to be continued.Outpatient followup with his cardiologist post discharge.   3. History of hypertension. Blood pressure on the low side due to dehydration, blood pressure medications held, continue gentle IV fluids and monitor blood pressure.    Code Status: Full  Family Communication: None present  Disposition Plan: Home   Procedures Echogram   Consults  Cardiology   DVT Prophylaxis  Xaralto  Lab Results  Component Value Date   PLT 149* 05/07/2013    Medications  Scheduled Meds: . atorvastatin  40 mg Oral q1800  . azithromycin  500 mg Intravenous Q24H  . cefTRIAXone (ROCEPHIN)  IV  1 g Intravenous Q24H  . flecainide  100 mg Oral Q12H  . fluticasone  2 spray Each Nare BID  . gabapentin  800 mg Oral TID  . latanoprost  1 drop Both Eyes Daily  . loratadine  10 mg Oral Daily  . oxybutynin  10 mg Oral QHS  . pantoprazole  40 mg Oral Daily  . Rivaroxaban  20 mg Oral Q supper  . tamsulosin  0.4 mg Oral Daily  . terbinafine  250 mg Oral  Daily   Continuous Infusions: . sodium chloride 75 mL/hr at 05/06/13 1438   PRN Meds:.acetaminophen, albuterol, ondansetron, traMADol  Antibiotics     Anti-infectives   Start     Dose/Rate Route Frequency Ordered Stop   05/06/13 1300  terbinafine (LAMISIL) tablet 250 mg     250 mg Oral Daily 05/06/13 1143     05/06/13 1300  cefTRIAXone (ROCEPHIN) 1 g in dextrose 5 % 50 mL IVPB     1 g 100 mL/hr over 30 Minutes Intravenous Every 24 hours 05/06/13 1143 05/13/13 1259   05/06/13 1300  azithromycin (ZITHROMAX) 500 mg in dextrose 5 % 250 mL IVPB     500 mg 250 mL/hr over 60 Minutes Intravenous Every 24 hours 05/06/13 1143 05/13/13 1259   05/06/13 0845  cefTRIAXone (ROCEPHIN) 1 g in dextrose 5 % 50 mL IVPB     1 g 100 mL/hr over 30 Minutes Intravenous  Once 05/06/13 0832 05/06/13 0950   05/06/13 0845  azithromycin (ZITHROMAX) tablet 500 mg  500 mg Oral  Once 05/06/13 0865 05/06/13 0841       Time Spent in minutes  35   [name] K M.D on 05/07/2013 at 9:53 AM  Between 7am to 7pm - Pager - [phone]  After 7pm go to [email or web address] - password [email or web address]  And look for the night coverage person covering for me after hours  [facility] Office  [phone]    Subjective:   [name] today has, No headache, No chest pain, No abdominal pain - No Nausea, No new weakness tingling or numbness, No Cough - SOB.    Objective:   Filed Vitals:   05/06/13 1712 05/06/13 2023 05/07/13 0001 05/07/13 0508  BP:  128/85 114/77 116/77  Pulse:  86 67 67  Temp: 100.3 F (37.9 C) 97.7 F (36.5 C)  98.1 F (36.7 C)  TempSrc:  Oral  Oral  Resp:  18 18 18   Height:      Weight:    89.858 kg (198 lb 1.6 oz)  SpO2:  94% 95% 93%    Wt Readings from Last 3 Encounters:  05/07/13 89.858 kg (198 lb 1.6 oz)  03/28/13 95.312 kg (210 lb 2 oz)  03/02/13 97.523 kg (215 lb)     Intake/Output Summary (Last 24 hours) at 05/07/13 0953 Last data filed at 05/07/13 0940  Gross per 24  hour  Intake    680 ml  Output   2150 ml  Net  -1470 ml    Exam Awake Alert, Oriented X 3, No new F.N deficits, Normal affect [place].AT,PERRAL Supple Neck,No JVD, No cervical lymphadenopathy appriciated.  Symmetrical Chest wall movement, Good air movement bilaterally, CTAB RRR,No Gallops,Rubs or new Murmurs, No Parasternal Heave +ve B.Sounds, Abd Soft, Non tender, No organomegaly appriciated, No rebound - guarding or rigidity. No Cyanosis, Clubbing or edema, No new Rash or bruise      Data Review   Micro Results No results found for this or any previous visit (from the past 240 hour(s)).  Radiology Reports Dg Chest 2 View  05/06/2013   *RADIOLOGY REPORT*  Clinical Data: Dizziness and cough.  CHEST - 2 VIEW  Comparison: 02/13/2013  Findings: There is a new airspace infiltrate of the right upper lobe likely representing pneumonia.  Stable scarring and chronic atelectasis present at both lung bases.  No overt edema or significant pleural fluid identified.  Heart size is stable.  There is stable appearance to a dual chamber pacemaker.  IMPRESSION: New right upper lobe infiltrate.   Original Report Authenticated By: [name], M.D.    CBC  Recent Labs Lab 05/06/13 0800 05/07/13 0420  WBC 13.8* 10.7*  HGB 16.1 14.2  HCT 46.7 42.5  PLT 159 149*  MCV 87.1 88.2  MCH 30.0 29.5  MCHC 34.5 33.4  RDW 13.5 13.6  LYMPHSABS 0.9  --   MONOABS 1.7*  --   EOSABS 0.0  --   BASOSABS 0.0  --     Chemistries   Recent Labs Lab 05/06/13 0800 05/07/13 0420  NA 133* 133*  K 4.2 4.0  CL 96 101  CO2 24 23  GLUCOSE 125* 97  BUN 15 12  CREATININE 1.00 0.86  CALCIUM 9.9 8.5   ------------------------------------------------------------------------------------------------------------------ estimated creatinine clearance is 88.6 ml/min (by C-G formula based on Cr of  0.86). ------------------------------------------------------------------------------------------------------------------ No results found for this basename: HGBA1C,  in the last 72 hours ------------------------------------------------------------------------------------------------------------------ No results found for this basename: CHOL, HDL, LDLCALC, TRIG, CHOLHDL, LDLDIRECT,  in the last  72 hours ------------------------------------------------------------------------------------------------------------------ No results found for this basename: TSH, T4TOTAL, FREET3, T3FREE, THYROIDAB,  in the last 72 hours ------------------------------------------------------------------------------------------------------------------ No results found for this basename: VITAMINB12, FOLATE, FERRITIN, TIBC, IRON, RETICCTPCT,  in the last 72 hours  Coagulation profile No results found for this basename: INR, PROTIME,  in the last 168 hours  No results found for this basename: DDIMER,  in the last 72 hours  Cardiac Enzymes  Recent Labs Lab 05/06/13 1215 05/06/13 1951 05/06/13 2330  CKMB 2.1 2.4 2.4  TROPONINI <0.30 <0.30 <0.30   ------------------------------------------------------------------------------------------------------------------ No components found with this basename: POCBNP,

## 2013-05-07 NOTE — Progress Notes (Signed)
Patient ID: [name], male   DOB: 1941/02/01, 72 y.o.   MRN: [id number]  [facility] Subjective: No chest pain dyspnea or palpitations                                                                                  Lab Results  Component Value Date   PLT 149* 05/07/2013    Medications  Scheduled Meds: . atorvastatin  40 mg Oral q1800  . azithromycin  500 mg Intravenous Q24H  . cefTRIAXone (ROCEPHIN)  IV  1 g Intravenous Q24H  . flecainide  100 mg Oral Q12H  . fluticasone  2 spray Each Nare BID  . gabapentin  800 mg Oral TID  . latanoprost  1 drop Both Eyes Daily  . loratadine  10 mg Oral Daily  . oxybutynin  10 mg Oral QHS  . pantoprazole  40 mg Oral Daily  . Rivaroxaban  20 mg Oral Q supper  . tamsulosin  0.4 mg Oral Daily  . terbinafine  250 mg Oral Daily   Continuous Infusions: . sodium chloride 75 mL/hr at 05/06/13 1438   PRN Meds:.acetaminophen, albuterol, ondansetron, traMADol  Antibiotics     Anti-infectives   Start     Dose/Rate Route Frequency Ordered Stop   05/06/13 1300  terbinafine (LAMISIL) tablet 250 mg     250 mg Oral Daily 05/06/13 1143     05/06/13 1300  cefTRIAXone (ROCEPHIN) 1 g in dextrose 5 % 50 mL IVPB     1 g 100 mL/hr over 30 Minutes Intravenous Every 24 hours 05/06/13 1143 05/13/13 1259   05/06/13 1300  azithromycin (ZITHROMAX) 500 mg in dextrose 5 % 250 mL IVPB     500 mg 250 mL/hr over 60 Minutes Intravenous Every 24 hours 05/06/13 1143 05/13/13 1259   05/06/13 0845  cefTRIAXone (ROCEPHIN) 1 g in dextrose 5 % 50 mL IVPB     1 g 100 mL/hr over 30 Minutes Intravenous  Once 05/06/13 0832 05/06/13 0950   05/06/13 0845  azithromycin (ZITHROMAX) tablet 500 mg     500 mg Oral  Once 05/06/13 0832 05/06/13 0841      Objective:   Filed Vitals:   05/06/13 1712 05/06/13 2023 05/07/13 0001 05/07/13 0508  BP:  128/85 114/77 116/77  Pulse:  86 67 67  Temp: 100.3 F (37.9 C) 97.7 F (36.5 C)  98.1 F (36.7 C)  TempSrc:  Oral  Oral   Resp:  18 18 18   Height:      Weight:    198 lb 1.6 oz (89.858 kg)  SpO2:  94% 95% 93%    Wt Readings from Last 3 Encounters:  05/07/13 198 lb 1.6 oz (89.858 kg)  03/28/13 210 lb 2 oz (95.312 kg)  03/02/13 215 lb (97.523 kg)     Intake/Output Summary (Last 24 hours) at 05/07/13 0958 Last data filed at 05/07/13 0940  Gross per 24 hour  Intake    680 ml  Output   2150 ml  Net  -1470 ml    Exam Awake Alert, Oriented X 3, No new F.N deficits, Normal affect [place].AT,PERRAL Supple Neck,No JVD, No cervical lymphadenopathy appriciated.  Symmetrical Chest wall movement, Good air movement bilaterally, CTAB RRR,No Gallops,Rubs or new Murmurs, No Parasternal Heave +ve B.Sounds, Abd Soft, Non tender, No organomegaly appriciated, No rebound - guarding or rigidity. No Cyanosis, Clubbing or edema, No new Rash or bruise      Data Review   Micro Results No results found for this or any previous visit (from the past 240 hour(s)).  Radiology Reports Dg Chest 2 View  05/06/2013   *RADIOLOGY REPORT*  Clinical Data: Dizziness and cough.  CHEST - 2 VIEW  Comparison: 02/13/2013  Findings: There is a new airspace infiltrate of the right upper lobe likely representing pneumonia.  Stable scarring and chronic atelectasis present at both lung bases.  No overt edema or significant pleural fluid identified.  Heart size is stable.  There is stable appearance to a dual chamber pacemaker.  IMPRESSION: New right upper lobe infiltrate.   Original Report Authenticated By: Irish Lack, M.D.    CBC  Recent Labs Lab 05/06/13 0800 05/07/13 0420  WBC 13.8* 10.7*  HGB 16.1 14.2  HCT 46.7 42.5  PLT 159 149*  MCV 87.1 88.2  MCH 30.0 29.5  MCHC 34.5 33.4  RDW 13.5 13.6  LYMPHSABS 0.9  --   MONOABS 1.7*  --   EOSABS 0.0  --   BASOSABS 0.0  --     Chemistries   Recent Labs Lab 05/06/13 0800 05/07/13 0420  NA 133* 133*  K 4.2 4.0  CL 96 101  CO2 24 23  GLUCOSE 125* 97  BUN 15 12  CREATININE  1.00 0.86  CALCIUM 9.9 8.5    Cardiac Enzymes  Recent Labs Lab 05/06/13 1215 05/06/13 1951 05/06/13 2330  CKMB 2.1 2.4 2.4  TROPONINI <0.30 <0.30 <0.30    Plan: Pacer:  Normal function no arrythmia on telemetry  Stable PAF:  NSR now continue xarelto Pneumonia:  Continue antibiotic Rx  Charlton Haws

## 2013-05-07 NOTE — Progress Notes (Signed)
Notified Dr. Thedore Mins via text page of patient having an elevated temperature this afternoon and that tylenol was originally given for back pain but was still in the time frame of tylenol given and therefore could not give anymore tylenol. Notified in text page that temperature came down to 99.4. Will continue to monitor.

## 2013-05-07 NOTE — Progress Notes (Signed)
Patient had a temperature of 101.4 this afternoon. Patient had already received tylenol an hour and a half earlier and therefore was not able to administer any tylenol but the tylenol previously given should also help with lowering his temperature. Patient states that he feels fine, no nausea or vomiting noted. Rechecked temperature and it is currently 99.4. Will continue to monitor to the end of shift.

## 2013-05-08 DIAGNOSIS — J189 Pneumonia, unspecified organism: Secondary | ICD-10-CM | POA: Diagnosis not present

## 2013-05-08 DIAGNOSIS — I369 Nonrheumatic tricuspid valve disorder, unspecified: Secondary | ICD-10-CM

## 2013-05-08 LAB — BASIC METABOLIC PANEL
Chloride: 105 mEq/L (ref 96–112)
GFR calc Af Amer: >90 mL/min (ref >90)
Potassium: 4.1 mEq/L (ref 3.5–5.1)

## 2013-05-08 LAB — CBC
Platelets: 159 10*3/uL (ref 150–400)
RDW: 13.8 % (ref 11.5–15.5)
WBC: 8 10*3/uL (ref 4.0–10.5)

## 2013-05-08 MED ORDER — AZITHROMYCIN 250 MG PO TABS: 250.0000 mg | ORAL_TABLET | ORAL | Status: DC

## 2013-05-08 NOTE — Discharge Summary (Signed)
[facility]                                                                                   [name], is a 72 y.o. male  DOB 11/01/1941  MRN [id number].  Admission date:  05/06/2013  Discharge Date:  05/08/2013  Primary MD  Oliver Barre, MD  Admitting Physician  Drema Dallas, MD  Admission Diagnosis  Community acquired pneumonia [486]  Discharge Diagnosis     Principal Problem:   CAP (community acquired pneumonia) Active Problems:   HYPERTENSION   Atrial fibrillation   Near syncope   abnormal (EKG)      Past Medical History  Diagnosis Date  . Hypertension   . Hyperlipidemia   . BPH (benign prostatic hyperplasia)   . Diverticulosis of colon (without mention of hemorrhage)   . Esophageal reflux   . Abdominal pain, epigastric   . Depressive disorder, not elsewhere classified   . Lumbago   . Allergic rhinitis, cause unspecified   . Sick sinus syndrome July 2010    s/p P[employer] Conservation officer, historic buildings) by [employer]  . Persistent atrial fibrillation   . Chronic anticoagulation     xarelto  . High risk medication use   . Paralyzed hemidiaphragm     LEFT  . Coronary artery calcification seen on CAT scan 12/26/2012  . Arthritis     Past Surgical History  Procedure Laterality Date  . 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   Discharge Diagnoses:   Principal Problem:   CAP (community acquired pneumonia) Active Problems:   HYPERTENSION   Atrial fibrillation   Near syncope   abnormal (EKG)    Discharge Condition: Stable   Diet recommendation: See Discharge Instructions below   Consults      History of present illness and  Hospital Course:     Kindly see H&P for history of present illness and admission  details, please review complete Labs, Consult reports and Test reports for all details in brief [name], is a 72 y.o. male, patient with history of atrial fibrillation on xaralto, hypertension, dyslipidemia who was to the hospital for generalized weakness secondary to community-acquired pneumonia, patient also had fevers upon admission, and was slightly hypotensive, he was treated with IV fluids and empiric IV antibiotics to which he responded well, he is now afebrile leukocytosis is resolved he's cough and shortness of breath free and is not weak or dizzy anymore.   Stable on room at, ambulatory in the hallway without any assistance or discomfort, cultures so far have been negative, he will be discharged home on oral antibiotics for 5 more days, I have reduced his antihypertensive medications, will request primary care physician to kindly monitor his blood pressure and adjust medications as needed. Please repeat CBC BMP in 3 days, review final blood culture results in 3 days, repeat a two-view chest x-ray in a week  to document complete resolution of his infiltrate.         Today   Subjective:   [name] today has no headache,no chest abdominal pain,no new weakness tingling or numbness, feels much better wants to go home today.   Objective:   Blood pressure 130/75, pulse 63, temperature 98.2 F (36.8 C), temperature source Oral, resp. rate 16, height 5\' 9"  (1.753 m), weight 90.538 kg (199 lb 9.6 oz), SpO2 100.00%.   Intake/Output Summary (Last 24 hours) at 05/08/13 1716 Last data filed at 05/08/13 1546  Gross per 24 hour  Intake    460 ml  Output   1100 ml  Net   -640 ml    Exam Awake Alert, Oriented *3, No new F.N deficits, Normal affect [place].AT,PERRAL Supple Neck,No JVD, No cervical lymphadenopathy appriciated.  Symmetrical Chest wall movement, Good air movement bilaterally, CTAB RRR,No Gallops,Rubs or new Murmurs, No Parasternal Heave +ve B.Sounds, Abd Soft, Non tender, No  organomegaly appriciated, No rebound -guarding or rigidity. No Cyanosis, Clubbing or edema, No new Rash or bruise  Data Review   Major procedures and Radiology Reports - PLEASE review detailed and final reports for all details in brief -       Dg Chest 2 View  05/06/2013   *RADIOLOGY REPORT*  Clinical Data: Dizziness and cough.  CHEST - 2 VIEW  Comparison: 02/13/2013  Findings: There is a new airspace infiltrate of the right upper lobe likely representing pneumonia.  Stable scarring and chronic atelectasis present at both lung bases.  No overt edema or significant pleural fluid identified.  Heart size is stable.  There is stable appearance to a dual chamber pacemaker.  IMPRESSION: New right upper lobe infiltrate.   Original Report Authenticated By: Irish Lack, M.D.    Micro Results      Recent Results (from the past 240 hour(s))  CULTURE, BLOOD (ROUTINE X 2)     Status: None   Collection Time    05/06/13 12:15 PM      Result Value Range Status   Specimen Description BLOOD RIGHT HAND   Final   Special Requests BOTTLES DRAWN AEROBIC ONLY 10CC   Final   Culture  Setup Time 05/06/2013 20:55   Final   Culture     Final   Value:        BLOOD CULTURE RECEIVED NO GROWTH TO DATE CULTURE WILL BE HELD FOR 5 DAYS BEFORE ISSUING A FINAL NEGATIVE REPORT   Report Status PENDING   Incomplete  CULTURE, BLOOD (ROUTINE X 2)     Status: None   Collection Time    05/06/13 12:28 PM      Result Value Range Status   Specimen Description BLOOD LEFT HAND   Final   Special Requests BOTTLES DRAWN AEROBIC ONLY 3CC   Final   Culture  Setup Time 05/06/2013 20:56   Final   Culture     Final   Value:        BLOOD CULTURE RECEIVED NO GROWTH TO DATE CULTURE WILL BE HELD FOR 5 DAYS BEFORE ISSUING A FINAL NEGATIVE REPORT   Report Status PENDING   Incomplete  URINE CULTURE     Status: None   Collection Time    05/06/13  2:54 PM      Result Value Range Status   Specimen Description URINE, CLEAN CATCH   Final    Special Requests NONE   Final   Culture  Setup Time 05/06/2013 15:36   Final  Colony Count NO GROWTH   Final   Culture NO GROWTH   Final   Report Status 05/07/2013 FINAL   Final     CBC w Diff: Lab Results  Component Value Date   WBC 8.0 05/08/2013   HGB 13.2 05/08/2013   HCT 40.0 05/08/2013   PLT 159 05/08/2013   LYMPHOPCT 7* 05/06/2013   MONOPCT 12 05/06/2013   EOSPCT 0 05/06/2013   BASOPCT 0 05/06/2013    CMP: Lab Results  Component Value Date   NA 137 05/08/2013   K 4.1 05/08/2013   CL 105 05/08/2013   CO2 25 05/08/2013   BUN 11 05/08/2013   CREATININE 0.86 05/08/2013   PROT 7.7 03/28/2013   ALBUMIN 4.4 03/28/2013   BILITOT 0.7 03/28/2013   ALKPHOS 71 03/28/2013   AST 25 03/28/2013   ALT 27 03/28/2013  .   Discharge Instructions     Follow with Primary MD Oliver Barre, MD in 3 days   Get CBC, CMP, checked 3 days by Primary MD and again as instructed by your Primary MD. Get a 2 view Chest X ray done next visit  Get Medicines reviewed and adjusted.  Please request your Prim.MD to go over all Hospital Tests and Procedure/Radiological results at the follow up, please get all Hospital records sent to your Prim MD by signing hospital release before you go home.  Activity: As tolerated with Full fall precautions use walker/cane & assistance as needed   Diet:  Heart Healthy    For Heart failure patients - Check your Weight same time everyday, if you gain over 2 pounds, or you develop in leg swelling, experience more shortness of breath or chest pain, call your Primary MD immediately. Follow Cardiac Low Salt Diet and 1.8 lit/day fluid restriction.  Disposition Home   If you experience worsening of your admission symptoms, develop shortness of breath, life threatening emergency, suicidal or homicidal thoughts you must seek medical attention immediately by calling 911 or calling your MD immediately  if symptoms less severe.  You Must read complete instructions/literature along with  all the possible adverse reactions/side effects for all the Medicines you take and that have been prescribed to you. Take any new Medicines after you have completely understood and accpet all the possible adverse reactions/side effects.   Do not drive and provide baby sitting services if your were admitted for syncope or siezures until you have seen by Primary MD or a Neurologist and advised to do so again.  Do not drive when taking Pain medications.    Do not take more than prescribed Pain, Sleep and Anxiety Medications  Special Instructions: If you have smoked or chewed Tobacco  in the last 2 yrs please stop smoking, stop any regular Alcohol  and or any Recreational drug use.  Wear Seat belts while driving.   Please note  You were cared for by a hospitalist during your hospital stay. If you have any questions about your discharge medications or the care you received while you were in the hospital after you are discharged, you can call the unit and asked to speak with the hospitalist on call if the hospitalist that took care of you is not available. Once you are discharged, your primary care physician will handle any further medical issues. Please note that NO REFILLS for any discharge medications will be authorized once you are discharged, as it is imperative that you return to your primary care physician (or establish a relationship with a  primary care physician if you do not have one) for your aftercare needs so that they can reassess your need for medications and monitor your lab values.    Follow-up Information   Follow up with Oliver Barre, MD In 3 days.   Contact information:   [address] [place] [address] [phone]         Discharge Medications     Medication List    STOP taking these medications       losartan 50 MG tablet  Commonly known as:  COZAAR      TAKE these medications       amLODipine 10 MG tablet  Commonly known as:  NORVASC  Take 10  mg by mouth daily.     atorvastatin 40 MG tablet  Commonly known as:  LIPITOR  Take 40 mg by mouth daily.     azithromycin 250 MG tablet  Commonly known as:  ZITHROMAX  Take 1 tablet (250 mg total) by mouth daily.     cetirizine 10 MG tablet  Commonly known as:  ZYRTEC  Take 10 mg by mouth daily.     flecainide 100 MG tablet  Commonly known as:  TAMBOCOR  Take 100 mg by mouth 2 (two) times daily.     fluticasone 50 MCG/ACT nasal spray  Commonly known as:  FLONASE  Place 2 sprays into the nose daily.     gabapentin 800 MG tablet  Commonly known as:  NEURONTIN  Take 800 mg by mouth 3 (three) times daily.     latanoprost 0.005 % ophthalmic solution  Commonly known as:  XALATAN  Place 1 drop into both eyes daily.     lisinopril 10 MG tablet  Commonly known as:  PRINIVIL,ZESTRIL  Take 10 mg by mouth daily.     naproxen sodium 220 MG tablet  Commonly known as:  ANAPROX  Take 440 mg by mouth daily.     omeprazole 20 MG capsule  Commonly known as:  PRILOSEC  Take 20 mg by mouth daily.     oxybutynin 10 MG 24 hr tablet  Commonly known as:  DITROPAN-XL  Take 10 mg by mouth daily.     tamsulosin 0.4 MG Caps  Commonly known as:  FLOMAX  Take 0.4 mg by mouth daily.     terbinafine 250 MG tablet  Commonly known as:  LAMISIL  Take 1 tablet (250 mg total) by mouth daily.     traMADol 50 MG tablet  Commonly known as:  ULTRAM  Take 50 mg by mouth every 6 (six) hours as needed for pain.     XARELTO 20 MG Tabs  Generic drug:  Rivaroxaban  Take 20 mg by mouth daily.           Total Time in preparing paper work, data evaluation and todays exam - 35 minutes  Leroy Sea M.D on 05/08/2013 at 5:16 PM  [facility] Office  [phone]

## 2013-05-08 NOTE — Progress Notes (Signed)
Discharged instruction given to pt with understanding and no questions asked, removed IV without bleeding, off from the telemetry and assisted by the Nurse tech per wheelchair .

## 2013-05-08 NOTE — Progress Notes (Signed)
Echocardiogram 2D Echocardiogram has been performed.  [name] 05/08/2013, 11:59 AM

## 2013-05-08 NOTE — Progress Notes (Signed)
UR COMPLETED  

## 2013-05-08 NOTE — Progress Notes (Signed)
[facility]                                                                                Patient Demographics  [name], is a 72 y.o. male, DOB - 1941/09/19, ZOX:[id number], WJX:[id number]  Admit date - 05/06/2013  Admitting Physician Drema Dallas, MD  Outpatient Primary MD for the patient is Oliver Barre, MD  LOS - 2   Chief Complaint  Patient presents with  . Dizziness        Assessment & Plan    1.Community acquired pneumonia. He already feels much better, follow cultures which are pending, responded well to empiric azithromycin and Rocephin which will be continued. We'll continue IV antibiotics for another day as he had low-grade fevers last night, clinically feels much better, Repeat 2 view chest x-ray in 7-10 days post discharge to document resolution.   2. History of atrial fibrillation. New right bundle branch block. Mild dizziness due to dehydration- Ruled out for a mother with serial negative troponins, seen by cardiology, echogram pending, feels better after IV fluids, Home dose statin to be continued.Outpatient followup with his cardiologist post discharge.   3. History of hypertension. Blood pressure on the low side due to dehydration, blood pressure medications held, stop IV fluids as blood pressure has improved some and monitor.    Code Status: Full  Family Communication: None present  Disposition Plan: Home   Procedures Echogram   Consults  Cardiology   DVT Prophylaxis  Xaralto  Lab Results  Component Value Date   PLT 159 05/08/2013    Medications  Scheduled Meds: . atorvastatin  40 mg Oral q1800  . azithromycin  500 mg Oral Q24H  . cefTRIAXone (ROCEPHIN)  IV  1 g Intravenous Q24H  . flecainide  100 mg Oral Q12H  . fluticasone  2 spray Each Nare BID  . gabapentin  800 mg Oral TID  . latanoprost  1 drop Both Eyes Daily  . loratadine  10 mg Oral Daily  . oxybutynin  10 mg Oral QHS  . pantoprazole  40 mg Oral Daily  . Rivaroxaban   20 mg Oral Q supper  . tamsulosin  0.4 mg Oral Daily  . terbinafine  250 mg Oral Daily   Continuous Infusions: . sodium chloride 75 mL/hr at 05/07/13 1735   PRN Meds:.acetaminophen, albuterol, ondansetron, traMADol  Antibiotics     Anti-infectives   Start     Dose/Rate Route Frequency Ordered Stop   05/07/13 1300  azithromycin (ZITHROMAX) tablet 500 mg     500 mg Oral Every 24 hours 05/07/13 1213 05/14/13 1259   05/06/13 1300  terbinafine (LAMISIL) tablet 250 mg     250 mg Oral Daily 05/06/13 1143     05/06/13 1300  cefTRIAXone (ROCEPHIN) 1 g in dextrose 5 % 50 mL IVPB     1 g 100 mL/hr over 30 Minutes Intravenous Every 24 hours 05/06/13 1143 05/13/13 1259   05/06/13 1300  azithromycin (ZITHROMAX) 500 mg in dextrose 5 % 250 mL IVPB  Status:  Discontinued     500 mg 250 mL/hr over 60 Minutes Intravenous Every 24 hours 05/06/13 1143 05/07/13 1213   05/06/13  0845  cefTRIAXone (ROCEPHIN) 1 g in dextrose 5 % 50 mL IVPB     1 g 100 mL/hr over 30 Minutes Intravenous  Once 05/06/13 0832 05/06/13 0950   05/06/13 0845  azithromycin (ZITHROMAX) tablet 500 mg     500 mg Oral  Once 05/06/13 0454 05/06/13 0841       Time Spent in minutes  35   SINGH,PRASHANT K M.D on 05/08/2013 at 8:37 AM  Between 7am to 7pm - Pager - [phone]  After 7pm go to [email or web address] - password [email or web address]  And look for the night coverage person covering for me after hours  [facility] Office  [phone]    Subjective:   [name] today has, No headache, No chest pain, No abdominal pain - No Nausea, No new weakness tingling or numbness, No Cough - SOB.    Objective:   Filed Vitals:   05/07/13 1449 05/07/13 1825 05/07/13 2133 05/08/13 0558  BP: 110/73  111/72 125/76  Pulse: 67  67 64  Temp: 101.4 F (38.6 C) 99.4 F (37.4 C) 99.3 F (37.4 C) 99.1 F (37.3 C)  TempSrc: Oral Oral Oral Oral  Resp: 19  18 17   Height:      Weight:    90.538 kg (199 lb 9.6 oz)  SpO2: 96%  95% 96%    Wt  Readings from Last 3 Encounters:  05/08/13 90.538 kg (199 lb 9.6 oz)  03/28/13 95.312 kg (210 lb 2 oz)  03/02/13 97.523 kg (215 lb)     Intake/Output Summary (Last 24 hours) at 05/08/13 0837 Last data filed at 05/08/13 0837  Gross per 24 hour  Intake    820 ml  Output   1450 ml  Net   -630 ml    Exam Awake Alert, Oriented X 3, No new F.N deficits, Normal affect [place].AT,PERRAL Supple Neck,No JVD, No cervical lymphadenopathy appriciated.  Symmetrical Chest wall movement, Good air movement bilaterally, CTAB RRR,No Gallops,Rubs or new Murmurs, No Parasternal Heave +ve B.Sounds, Abd Soft, Non tender, No organomegaly appriciated, No rebound - guarding or rigidity. No Cyanosis, Clubbing or edema, No new Rash or bruise      Data Review   Micro Results Recent Results (from the past 240 hour(s))  CULTURE, BLOOD (ROUTINE X 2)     Status: None   Collection Time    05/06/13 12:15 PM      Result Value Range Status   Specimen Description BLOOD RIGHT HAND   Final   Special Requests BOTTLES DRAWN AEROBIC ONLY 10CC   Final   Culture  Setup Time 05/06/2013 20:55   Final   Culture     Final   Value:        BLOOD CULTURE RECEIVED NO GROWTH TO DATE CULTURE WILL BE HELD FOR 5 DAYS BEFORE ISSUING A FINAL NEGATIVE REPORT   Report Status PENDING   Incomplete  CULTURE, BLOOD (ROUTINE X 2)     Status: None   Collection Time    05/06/13 12:28 PM      Result Value Range Status   Specimen Description BLOOD LEFT HAND   Final   Special Requests BOTTLES DRAWN AEROBIC ONLY 3CC   Final   Culture  Setup Time 05/06/2013 20:56   Final   Culture     Final   Value:        BLOOD CULTURE RECEIVED NO GROWTH TO DATE CULTURE WILL BE HELD FOR 5 DAYS BEFORE ISSUING A FINAL NEGATIVE REPORT  Report Status PENDING   Incomplete  URINE CULTURE     Status: None   Collection Time    05/06/13  2:54 PM      Result Value Range Status   Specimen Description URINE, CLEAN CATCH   Final   Special Requests NONE   Final    Culture  Setup Time 05/06/2013 15:36   Final   Colony Count NO GROWTH   Final   Culture NO GROWTH   Final   Report Status 05/07/2013 FINAL   Final    Radiology Reports Dg Chest 2 View  05/06/2013   *RADIOLOGY REPORT*  Clinical Data: Dizziness and cough.  CHEST - 2 VIEW  Comparison: 02/13/2013  Findings: There is a new airspace infiltrate of the right upper lobe likely representing pneumonia.  Stable scarring and chronic atelectasis present at both lung bases.  No overt edema or significant pleural fluid identified.  Heart size is stable.  There is stable appearance to a dual chamber pacemaker.  IMPRESSION: New right upper lobe infiltrate.   Original Report Authenticated By: Irish Lack, M.D.    CBC  Recent Labs Lab 05/06/13 0800 05/07/13 0420 05/08/13 0535  WBC 13.8* 10.7* 8.0  HGB 16.1 14.2 13.2  HCT 46.7 42.5 40.0  PLT 159 149* 159  MCV 87.1 88.2 87.9  MCH 30.0 29.5 29.0  MCHC 34.5 33.4 33.0  RDW 13.5 13.6 13.8  LYMPHSABS 0.9  --   --   MONOABS 1.7*  --   --   EOSABS 0.0  --   --   BASOSABS 0.0  --   --     Chemistries   Recent Labs Lab 05/06/13 0800 05/07/13 0420 05/08/13 0535  NA 133* 133* 137  K 4.2 4.0 4.1  CL 96 101 105  CO2 24 23 25   GLUCOSE 125* 97 105*  BUN 15 12 11   CREATININE 1.00 0.86 0.86  CALCIUM 9.9 8.5 8.4   ------------------------------------------------------------------------------------------------------------------ estimated creatinine clearance is 88.9 ml/min (by C-G formula based on Cr of 0.86). ------------------------------------------------------------------------------------------------------------------ No results found for this basename: HGBA1C,  in the last 72 hours ------------------------------------------------------------------------------------------------------------------ No results found for this basename: CHOL, HDL, LDLCALC, TRIG, CHOLHDL, LDLDIRECT,  in the last 72  hours ------------------------------------------------------------------------------------------------------------------ No results found for this basename: TSH, T4TOTAL, FREET3, T3FREE, THYROIDAB,  in the last 72 hours ------------------------------------------------------------------------------------------------------------------ No results found for this basename: VITAMINB12, FOLATE, FERRITIN, TIBC, IRON, RETICCTPCT,  in the last 72 hours  Coagulation profile No results found for this basename: INR, PROTIME,  in the last 168 hours  No results found for this basename: DDIMER,  in the last 72 hours  Cardiac Enzymes  Recent Labs Lab 05/06/13 1215 05/06/13 1951 05/06/13 2330  CKMB 2.1 2.4 2.4  TROPONINI <0.30 <0.30 <0.30   ------------------------------------------------------------------------------------------------------------------ No components found with this basename: POCBNP,

## 2013-05-12 ENCOUNTER — Encounter: Payer: Self-pay | Admitting: Internal Medicine

## 2013-05-12 ENCOUNTER — Ambulatory Visit (INDEPENDENT_AMBULATORY_CARE_PROVIDER_SITE_OTHER): Payer: Medicare Other | Admitting: Internal Medicine

## 2013-05-12 VITALS — BP 120/82 | HR 60 | Temp 98.0°F | Ht 69.0 in | Wt 199.2 lb

## 2013-05-12 DIAGNOSIS — R55 Syncope and collapse: Secondary | ICD-10-CM

## 2013-05-12 DIAGNOSIS — J189 Pneumonia, unspecified organism: Secondary | ICD-10-CM

## 2013-05-12 DIAGNOSIS — I10 Essential (primary) hypertension: Secondary | ICD-10-CM | POA: Diagnosis not present

## 2013-05-12 DIAGNOSIS — J309 Allergic rhinitis, unspecified: Secondary | ICD-10-CM

## 2013-05-12 LAB — CULTURE, BLOOD (ROUTINE X 2): Culture: NO GROWTH

## 2013-05-12 NOTE — Patient Instructions (Addendum)
Please continue all other medications as before, and refills have been done if requested. No further lab work or xray needed today Please continue your efforts at being more active, low cholesterol diet, and weight control. Please continue to monitor your Blood Pressure at home, but if you can keep the weight stable and dont gain, your BP may remain ok as it is.

## 2013-05-12 NOTE — Progress Notes (Signed)
Subjective:    Patient ID: [name], male    DOB: [birth year], 72 y.o.   MRN: [id number]  HPI  Here to f/u, Pt denies chest pain, increased sob or doe, wheezing, orthopnea, PND, increased LE swelling, palpitations, dizziness or syncope.   Pt denies fever, wt loss, night sweats, loss of appetite, or other constitutional symptoms  Recently hospd with pna -   Losartan stopped . Lost overall about 10 lbs with the episode but none since hosp d/c.  BP at home has been < 140/90.    Pt denies polydipsia, polyuria . Does have several wks ongoing nasal allergy symptoms with clearish congestion, itch and sneezing Past Medical History  Diagnosis Date  . Hypertension   . Hyperlipidemia   . BPH (benign prostatic hyperplasia)   . Diverticulosis of colon (without mention of hemorrhage)   . Esophageal reflux   . Abdominal pain, epigastric   . Depressive disorder, not elsewhere classified   . Lumbago   . Allergic rhinitis, cause unspecified   . Sick sinus syndrome July 2010    s/p PPM Conservation officer, historic buildings) by [employer]  . Persistent atrial fibrillation   . Chronic anticoagulation     xarelto  . High risk medication use   . Paralyzed hemidiaphragm     LEFT  . Coronary artery calcification seen on CAT scan 12/26/2012  . Arthritis    Past Surgical History  Procedure Laterality Date  . Insert / replace / remove pacemaker  05/2009     [employer] ALTRUA 60  (serial number [id number]) dual-chamber pacemaker    reports that he quit smoking about 34 years ago. His smoking use included Cigarettes. He has a 12.5 pack-year smoking history. He has quit using smokeless tobacco. His smokeless tobacco use included Chew. He reports that he does not drink alcohol or use illicit drugs. family history includes Arthritis in his paternal grandmother; Asthma in his sister; Cancer in his father; Diabetes in his mother; Hypertension in his brother, daughter, father, mother, sister, and son; and Lung cancer in his father. No Known  Allergies Current Outpatient Prescriptions on File Prior to Visit  Medication Sig Dispense Refill  . amLODipine (NORVASC) 10 MG tablet Take 10 mg by mouth daily.      Marland Kitchen atorvastatin (LIPITOR) 40 MG tablet Take 40 mg by mouth daily.      Marland Kitchen azithromycin (ZITHROMAX) 250 MG tablet Take 1 tablet (250 mg total) by mouth daily.  5 tablet  0  . cetirizine (ZYRTEC) 10 MG tablet Take 10 mg by mouth daily.      . flecainide (TAMBOCOR) 100 MG tablet Take 100 mg by mouth 2 (two) times daily.      . fluticasone (FLONASE) 50 MCG/ACT nasal spray Place 2 sprays into the nose daily.      Marland Kitchen gabapentin (NEURONTIN) 800 MG tablet Take 800 mg by mouth 3 (three) times daily.      Marland Kitchen latanoprost (XALATAN) 0.005 % ophthalmic solution Place 1 drop into both eyes daily.      Marland Kitchen lisinopril (PRINIVIL,ZESTRIL) 10 MG tablet Take 10 mg by mouth daily.      . naproxen sodium (ANAPROX) 220 MG tablet Take 440 mg by mouth daily.      Marland Kitchen omeprazole (PRILOSEC) 20 MG capsule Take 20 mg by mouth daily.      Marland Kitchen oxybutynin (DITROPAN-XL) 10 MG 24 hr tablet Take 10 mg by mouth daily.      . Rivaroxaban (XARELTO) 20 MG TABS Take  20 mg by mouth daily.      . tamsulosin (FLOMAX) 0.4 MG CAPS Take 0.4 mg by mouth daily.      Marland Kitchen terbinafine (LAMISIL) 250 MG tablet Take 1 tablet (250 mg total) by mouth daily.    2  . traMADol (ULTRAM) 50 MG tablet Take 50 mg by mouth every 6 (six) hours as needed for pain.      . [DISCONTINUED] furosemide (LASIX) 40 MG tablet Take 1 tablet (40 mg total) by mouth daily.  30 tablet  11  . [DISCONTINUED] oxybutynin (DITROPAN-XL) 10 MG 24 hr tablet Take 1 tablet (10 mg total) by mouth daily.  90 tablet  3   No current facility-administered medications on file prior to visit.   ,Review of Systems  Constitutional: Negative for unexpected weight change, or unusual diaphoresis  HENT: Negative for tinnitus.   Eyes: Negative for photophobia and visual disturbance.  Respiratory: Negative for choking and stridor.    Gastrointestinal: Negative for vomiting and blood in stool.  Genitourinary: Negative for hematuria and decreased urine volume.  Musculoskeletal: Negative for acute joint swelling Skin: Negative for color change and wound.  Neurological: Negative for tremors and numbness other than noted  Psychiatric/Behavioral: Negative for decreased concentration or  hyperactivity.       Objective:   Physical Exam BP 120/82  Pulse 60  Temp(Src) 98 F (36.7 C) (Oral)  Ht 5\' 9"  (1.753 m)  Wt 199 lb 4 oz (90.379 kg)  BMI 29.41 kg/m2  SpO2 97% VS noted, not ill appaering Constitutional: Pt appears well-developed and well-nourished.  HENT: Head: NCAT.  Right Ear: External ear normal.  Left Ear: External ear normal.  Bilat tm's with mild erythema.  Max sinus areas non tender.  Pharynx with mild erythema, no exudate Eyes: Conjunctivae and EOM are normal. Pupils are equal, round, and reactive to light.  Neck: Normal range of motion. Neck supple.  Cardiovascular: Normal rate and regular rhythm.   Pulmonary/Chest: Effort normal and breath sounds normal. - no rales or wheezing  Abd:  Soft, NT, non-distended, + BS Neurological: Pt is alert. Not confused  Skin: Skin is warm. No erythema.  Psychiatric: Pt behavior is normal. Thought content normal.     Assessment & Plan:

## 2013-05-13 NOTE — Assessment & Plan Note (Signed)
Dizzy, resolved,  to f/u any worsening symptoms or concerns

## 2013-05-13 NOTE — Assessment & Plan Note (Signed)
Resolved,  to f/u any worsening symptoms or concerns  

## 2013-05-13 NOTE — Assessment & Plan Note (Signed)
Ok for allegra otc prn,  to f/u any worsening symptoms or concerns 

## 2013-05-13 NOTE — Assessment & Plan Note (Signed)
Stable off losartan,  BP Readings from Last 3 Encounters:  05/12/13 120/82  05/08/13 130/75  03/28/13 112/80    to f/u any worsening symptoms or concerns, may need further med if regains wt

## 2013-05-30 DIAGNOSIS — L608 Other nail disorders: Secondary | ICD-10-CM | POA: Diagnosis not present

## 2013-05-30 DIAGNOSIS — H4011X0 Primary open-angle glaucoma, stage unspecified: Secondary | ICD-10-CM | POA: Diagnosis not present

## 2013-05-30 DIAGNOSIS — Z79899 Other long term (current) drug therapy: Secondary | ICD-10-CM | POA: Diagnosis not present

## 2013-05-30 DIAGNOSIS — H2510 Age-related nuclear cataract, unspecified eye: Secondary | ICD-10-CM | POA: Diagnosis not present

## 2013-06-01 ENCOUNTER — Other Ambulatory Visit: Payer: Self-pay | Admitting: Internal Medicine

## 2013-06-09 ENCOUNTER — Other Ambulatory Visit: Payer: Self-pay | Admitting: Internal Medicine

## 2013-06-09 ENCOUNTER — Other Ambulatory Visit: Payer: Self-pay | Admitting: *Deleted

## 2013-06-09 MED ORDER — TRAMADOL HCL 50 MG PO TABS: ORAL_TABLET | ORAL | Status: DC

## 2013-06-09 NOTE — Telephone Encounter (Signed)
Previous tramadol was printed by mistake. Resending it escript to pharmacy...Raechel Chute

## 2013-06-21 ENCOUNTER — Other Ambulatory Visit: Payer: Self-pay

## 2013-06-23 ENCOUNTER — Other Ambulatory Visit: Payer: Self-pay | Admitting: Internal Medicine

## 2013-06-26 ENCOUNTER — Encounter: Payer: Self-pay | Admitting: Internal Medicine

## 2013-06-26 ENCOUNTER — Ambulatory Visit (INDEPENDENT_AMBULATORY_CARE_PROVIDER_SITE_OTHER): Payer: Medicare Other | Admitting: Internal Medicine

## 2013-06-26 ENCOUNTER — Ambulatory Visit: Payer: Medicare Other | Admitting: Internal Medicine

## 2013-06-26 VITALS — BP 102/72 | HR 83 | Temp 97.3°F | Ht 69.0 in | Wt 199.4 lb

## 2013-06-26 DIAGNOSIS — J019 Acute sinusitis, unspecified: Secondary | ICD-10-CM | POA: Insufficient documentation

## 2013-06-26 DIAGNOSIS — J309 Allergic rhinitis, unspecified: Secondary | ICD-10-CM

## 2013-06-26 DIAGNOSIS — I10 Essential (primary) hypertension: Secondary | ICD-10-CM

## 2013-06-26 MED ORDER — LEVOFLOXACIN 250 MG PO TABS: 250.0000 mg | ORAL_TABLET | Freq: Every day | ORAL | Status: DC

## 2013-06-26 MED ORDER — FLUTICASONE PROPIONATE 50 MCG/ACT NA SUSP: 2.0000 | Freq: Every day | NASAL | Status: DC

## 2013-06-26 NOTE — Assessment & Plan Note (Signed)
Mild to mod, for flonase restart,  to f/u any worsening symptoms or concerns 

## 2013-06-26 NOTE — Assessment & Plan Note (Signed)
Mild to mod, for antibx course,  to f/u any worsening symptoms or concerns 

## 2013-06-26 NOTE — Patient Instructions (Addendum)
Please take all new medication as prescribed Please continue all other medications as before, and refills have been done if requested. You can also take Delsym OTC for cough, and/or Mucinex (or it's generic off brand) for congestion, and tylenol as needed for pain.  Please remember to sign up for My Chart if you have not done so, as this will be important to you in the future with finding out test results, communicating by private email, and scheduling acute appointments online when needed.  

## 2013-06-26 NOTE — Assessment & Plan Note (Signed)
stable overall by history and exam, recent data reviewed with pt, and pt to continue medical treatment as before,  to f/u any worsening symptoms or concerns BP Readings from Last 3 Encounters:  06/26/13 102/72  05/12/13 120/82  05/08/13 130/75

## 2013-06-26 NOTE — Progress Notes (Signed)
Subjective:    Patient ID: [name], male    DOB: 1941/02/08, 72 y.o.   MRN: [id number]  HPI  Here with 2-3 days acute onset fever, facial pain, pressure, headache, general weakness and malaise, and greenish d/c, with mild ST and cough, but pt denies chest pain, wheezing, increased sob or doe, orthopnea, PND, increased LE swelling, palpitations, dizziness or syncope.  Does have several wks ongoing nasal allergy symptoms with clearish congestion, itch and sneezing, without fever, pain, ST, cough, swelling or wheezing.   Pt denies polydipsia, polyuria.  Past Medical History  Diagnosis Date  . Hypertension   . Hyperlipidemia   . BPH (benign prostatic hyperplasia)   . Diverticulosis of colon (without mention of hemorrhage)   . Esophageal reflux   . Abdominal pain, epigastric   . Depressive disorder, not elsewhere classified   . Lumbago   . Allergic rhinitis, cause unspecified   . Sick sinus syndrome July 2010    s/p PPM Conservation officer, historic buildings) by [employer]  . Persistent atrial fibrillation   . Chronic anticoagulation     xarelto  . High risk medication use   . Paralyzed hemidiaphragm     LEFT  . Coronary artery calcification seen on CAT scan 12/26/2012  . Arthritis    Past Surgical History  Procedure Laterality Date  . Insert / replace / remove pacemaker  05/2009     [employer] ALTRUA 60  (serial number [id number]) dual-chamber pacemaker    reports that he quit smoking about 34 years ago. His smoking use included Cigarettes. He has a 12.5 pack-year smoking history. He has quit using smokeless tobacco. His smokeless tobacco use included Chew. He reports that he does not drink alcohol or use illicit drugs. family history includes Arthritis in his paternal grandmother; Asthma in his sister; Cancer in his father; Diabetes in his mother; Hypertension in his brother, daughter, father, mother, sister, and son; and Lung cancer in his father. No Known Allergies Current Outpatient Prescriptions on File  Prior to Visit  Medication Sig Dispense Refill  . amLODipine (NORVASC) 10 MG tablet Take 10 mg by mouth daily.      Marland Kitchen atorvastatin (LIPITOR) 40 MG tablet Take 40 mg by mouth daily.      Marland Kitchen azithromycin (ZITHROMAX) 250 MG tablet Take 1 tablet (250 mg total) by mouth daily.  5 tablet  0  . cetirizine (ZYRTEC) 10 MG tablet Take 10 mg by mouth daily.      . flecainide (TAMBOCOR) 100 MG tablet Take 100 mg by mouth 2 (two) times daily.      Marland Kitchen gabapentin (NEURONTIN) 800 MG tablet Take 800 mg by mouth 3 (three) times daily.      Marland Kitchen latanoprost (XALATAN) 0.005 % ophthalmic solution Place 1 drop into both eyes daily.      Marland Kitchen lisinopril (PRINIVIL,ZESTRIL) 10 MG tablet Take 10 mg by mouth daily.      . naproxen sodium (ANAPROX) 220 MG tablet Take 440 mg by mouth daily.      Marland Kitchen omeprazole (PRILOSEC) 20 MG capsule Take 20 mg by mouth daily.      Marland Kitchen omeprazole (PRILOSEC) 20 MG capsule TAKE ONE CAPSULE BY MOUTH EVERY DAY  90 capsule  3  . oxybutynin (DITROPAN-XL) 10 MG 24 hr tablet Take 10 mg by mouth daily.      . Rivaroxaban (XARELTO) 20 MG TABS Take 20 mg by mouth daily.      . tamsulosin (FLOMAX) 0.4 MG CAPS Take 0.4 mg  by mouth daily.      Marland Kitchen terbinafine (LAMISIL) 250 MG tablet Take 1 tablet (250 mg total) by mouth daily.    2  . traMADol (ULTRAM) 50 MG tablet Take 50 mg by mouth every 6 (six) hours as needed for pain.      . traMADol (ULTRAM) 50 MG tablet TAKE 1 TABLET (50 MG TOTAL) BY MOUTH EVERY 6 (SIX) HOURS AS NEEDED FOR PAIN.  60 tablet  2  . [DISCONTINUED] furosemide (LASIX) 40 MG tablet Take 1 tablet (40 mg total) by mouth daily.  30 tablet  11  . [DISCONTINUED] oxybutynin (DITROPAN-XL) 10 MG 24 hr tablet Take 1 tablet (10 mg total) by mouth daily.  90 tablet  3   No current facility-administered medications on file prior to visit.   Review of Systems  Constitutional: Negative for unexpected weight change, or unusual diaphoresis  HENT: Negative for tinnitus.   Eyes: Negative for photophobia and  visual disturbance.  Respiratory: Negative for choking and stridor.   Gastrointestinal: Negative for vomiting and blood in stool.  Genitourinary: Negative for hematuria and decreased urine volume.  Musculoskeletal: Negative for acute joint swelling Skin: Negative for color change and wound.  Neurological: Negative for tremors and numbness other than noted  Psychiatric/Behavioral: Negative for decreased concentration or  hyperactivity.       Objective:   Physical Exam BP 102/72  Pulse 83  Temp(Src) 97.3 F (36.3 C) (Oral)  Ht 5\' 9"  (1.753 m)  Wt 199 lb 6 oz (90.436 kg)  BMI 29.43 kg/m2  SpO2 95% VS noted, mild ill Constitutional: Pt appears well-developed and well-nourished.  HENT: Head: NCAT.  Right Ear: External ear normal.  Left Ear: External ear normal.  Bilat tm's with mild erythema.  Max sinus areas mild tender.  Pharynx with mild erythema, no exudate Eyes: Conjunctivae and EOM are normal. Pupils are equal, round, and reactive to light.  Neck: Normal range of motion. Neck supple.  Cardiovascular: Normal rate and regular rhythm.   Pulmonary/Chest: Effort normal and breath sounds normal.  Neurological: Pt is alert. Not confused  Skin: Skin is warm. No erythema.  Psychiatric: Pt behavior is normal. Thought content normal.     Assessment & Plan:

## 2013-06-28 ENCOUNTER — Other Ambulatory Visit: Payer: Self-pay | Admitting: Internal Medicine

## 2013-07-12 ENCOUNTER — Other Ambulatory Visit: Payer: Self-pay | Admitting: *Deleted

## 2013-07-12 MED ORDER — TRAMADOL HCL 50 MG PO TABS: ORAL_TABLET | ORAL | Status: DC

## 2013-07-12 NOTE — Telephone Encounter (Signed)
Done hardcopy to robin  

## 2013-07-12 NOTE — Telephone Encounter (Signed)
Faxed hardcopy to [facility] [address] [place] 

## 2013-07-13 ENCOUNTER — Encounter: Payer: Self-pay | Admitting: Internal Medicine

## 2013-07-13 ENCOUNTER — Ambulatory Visit (INDEPENDENT_AMBULATORY_CARE_PROVIDER_SITE_OTHER): Payer: Medicare Other | Admitting: Internal Medicine

## 2013-07-13 VITALS — BP 117/74 | HR 59 | Ht 69.0 in | Wt 206.0 lb

## 2013-07-13 DIAGNOSIS — I495 Sick sinus syndrome: Secondary | ICD-10-CM

## 2013-07-13 DIAGNOSIS — I498 Other specified cardiac arrhythmias: Secondary | ICD-10-CM | POA: Diagnosis not present

## 2013-07-13 DIAGNOSIS — E785 Hyperlipidemia, unspecified: Secondary | ICD-10-CM | POA: Diagnosis not present

## 2013-07-13 DIAGNOSIS — I10 Essential (primary) hypertension: Secondary | ICD-10-CM | POA: Diagnosis not present

## 2013-07-13 DIAGNOSIS — I4891 Unspecified atrial fibrillation: Secondary | ICD-10-CM | POA: Diagnosis not present

## 2013-07-13 LAB — PACEMAKER DEVICE OBSERVATION
AL THRESHOLD: 0.8 V
BAMS-0002: 8 ms
BAMS-0003: 70 {beats}/min
DEVICE MODEL PM: 764370
RV LEAD THRESHOLD: 1.4 V

## 2013-07-13 NOTE — Progress Notes (Signed)
PCP: Oliver Barre, MD  [name] is a 72 y.o. male who presents today for routine electrophysiology followup.  Since last being seen in our clinic, the patient reports doing reasonalbly well.  He has stable SOB due to paralyzed L hemidiaphragm.  He has pneumonia in June.  He has made good recovery.  Today, he denies symptoms of palpitations, chest pain, dizziness, presyncope, or syncope.  The patient is otherwise without complaint today.   Past Medical History  Diagnosis Date  . Hypertension   . Hyperlipidemia   . BPH (benign prostatic hyperplasia)   . Diverticulosis of colon (without mention of hemorrhage)   . Esophageal reflux   . Abdominal pain, epigastric   . Depressive disorder, not elsewhere classified   . Lumbago   . Allergic rhinitis, cause unspecified   . Sick sinus syndrome July 2010    s/p PPM Conservation officer, historic buildings) by [employer]  . Persistent atrial fibrillation   . Chronic anticoagulation     xarelto  . High risk medication use   . Paralyzed hemidiaphragm     LEFT  . Coronary artery calcification seen on CAT scan 12/26/2012  . Arthritis    Past Surgical History  Procedure Laterality Date  . Insert / replace / remove pacemaker  05/2009     [employer] ALTRUA 60  (serial number [id number]) dual-chamber pacemaker    Current Outpatient Prescriptions  Medication Sig Dispense Refill  . amLODipine (NORVASC) 10 MG tablet Take 10 mg by mouth daily.      Marland Kitchen atorvastatin (LIPITOR) 40 MG tablet Take 40 mg by mouth daily.      . [facility] ALLERGY RELIEF 180 MG tablet TAKE 1 TABLET (180 MG TOTAL) BY MOUTH DAILY.  90 tablet  3  . flecainide (TAMBOCOR) 100 MG tablet Take 100 mg by mouth 2 (two) times daily.      . fluticasone (FLONASE) 50 MCG/ACT nasal spray Place 2 sprays into the nose daily.  16 g  5  . gabapentin (NEURONTIN) 800 MG tablet Take 800 mg by mouth 3 (three) times daily.      Marland Kitchen latanoprost (XALATAN) 0.005 % ophthalmic solution Place 1 drop into both eyes daily.      Marland Kitchen lisinopril  (PRINIVIL,ZESTRIL) 10 MG tablet Take 10 mg by mouth daily.      Marland Kitchen omeprazole (PRILOSEC) 20 MG capsule Take 20 mg by mouth daily.      Marland Kitchen oxybutynin (DITROPAN-XL) 10 MG 24 hr tablet Take 10 mg by mouth daily.      . Rivaroxaban (XARELTO) 20 MG TABS Take 20 mg by mouth daily.      . tamsulosin (FLOMAX) 0.4 MG CAPS Take 0.4 mg by mouth daily.      . traMADol (ULTRAM) 50 MG tablet Take 50 mg by mouth every 6 (six) hours as needed for pain.      . [DISCONTINUED] furosemide (LASIX) 40 MG tablet Take 1 tablet (40 mg total) by mouth daily.  30 tablet  11  . [DISCONTINUED] oxybutynin (DITROPAN-XL) 10 MG 24 hr tablet Take 1 tablet (10 mg total) by mouth daily.  90 tablet  3   No current facility-administered medications for this visit.    Physical Exam: Filed Vitals:   07/13/13 0859  BP: 117/74  Pulse: 59  Height: 5\' 9"  (1.753 m)  Weight: 206 lb (93.441 kg)    GEN- The patient is well appearing, alert and oriented x 3 today.   Head- normocephalic, atraumatic Eyes-  Sclera clear,  conjunctiva pink Ears- hearing intact Oropharynx- clear Lungs- Clear to ausculation bilaterally, normal work of breathing Chest- pacemaker pocket is well healed Heart- Regular rate and rhythm, no murmurs, rubs or gallops, PMI not laterally displaced GI- soft, NT, ND, + BS Extremities- no clubbing, cyanosis, trace edema in R leg  Pacemaker interrogation- reviewed in detail today,  See PACEART report  Assessment and Plan:  1. Sick sinus syndrome/ symptomatic bradycardia Normal pacemaker function See Pace Art report No changes today  2. afib Well controlled with flecainide Continue xarelto for stroke prevention  3. HTN Stable No change required today  4. HL Stable No change required today  Return to see Nehemiah Settle in 6 months I will see in 1 year

## 2013-07-13 NOTE — Patient Instructions (Addendum)
Your physician wants you to follow-up in: 6 months with Brooke Edmisten, PA and 12 months with Dr Allred You will receive a reminder letter in the mail two months in advance. If you don't receive a letter, please call our office to schedule the follow-up appointment.  

## 2013-07-18 ENCOUNTER — Other Ambulatory Visit: Payer: Self-pay | Admitting: Internal Medicine

## 2013-08-17 DIAGNOSIS — Z23 Encounter for immunization: Secondary | ICD-10-CM | POA: Diagnosis not present

## 2013-08-21 ENCOUNTER — Other Ambulatory Visit: Payer: Self-pay | Admitting: Internal Medicine

## 2013-08-21 NOTE — Telephone Encounter (Signed)
[facility].../lmb 

## 2013-08-21 NOTE — Telephone Encounter (Signed)
MD out of office. Is this ok to refill.../lmb 

## 2013-08-25 ENCOUNTER — Other Ambulatory Visit: Payer: Self-pay | Admitting: Internal Medicine

## 2013-08-25 NOTE — Telephone Encounter (Signed)
rx just done oct 6 per Dr Felicity Coyer

## 2013-09-07 ENCOUNTER — Other Ambulatory Visit: Payer: Self-pay | Admitting: Internal Medicine

## 2013-09-07 NOTE — Telephone Encounter (Signed)
Done erx 

## 2013-09-18 ENCOUNTER — Other Ambulatory Visit: Payer: Self-pay | Admitting: Internal Medicine

## 2013-09-21 ENCOUNTER — Other Ambulatory Visit: Payer: Self-pay

## 2013-09-23 ENCOUNTER — Other Ambulatory Visit: Payer: Self-pay | Admitting: Internal Medicine

## 2013-09-25 ENCOUNTER — Ambulatory Visit: Payer: Medicare Other | Admitting: Internal Medicine

## 2013-09-26 ENCOUNTER — Ambulatory Visit (INDEPENDENT_AMBULATORY_CARE_PROVIDER_SITE_OTHER): Payer: Medicare Other | Admitting: Internal Medicine

## 2013-09-26 ENCOUNTER — Encounter: Payer: Self-pay | Admitting: Internal Medicine

## 2013-09-26 VITALS — BP 106/80 | HR 60 | Temp 98.1°F | Ht 69.0 in | Wt 204.4 lb

## 2013-09-26 DIAGNOSIS — E785 Hyperlipidemia, unspecified: Secondary | ICD-10-CM | POA: Diagnosis not present

## 2013-09-26 DIAGNOSIS — Z23 Encounter for immunization: Secondary | ICD-10-CM | POA: Diagnosis not present

## 2013-09-26 DIAGNOSIS — I10 Essential (primary) hypertension: Secondary | ICD-10-CM | POA: Diagnosis not present

## 2013-09-26 DIAGNOSIS — M5412 Radiculopathy, cervical region: Secondary | ICD-10-CM

## 2013-09-26 MED ORDER — TRAMADOL HCL 50 MG PO TABS: ORAL_TABLET | ORAL | Status: DC

## 2013-09-26 NOTE — Progress Notes (Signed)
Subjective:    Patient ID: [name], male    DOB: 01-18-1941, 72 y.o.   MRN: [id number]  HPI  Here to f/u; overall doing ok,  Pt denies chest pain, increased sob or doe, wheezing, orthopnea, PND, increased LE swelling, palpitations, dizziness or syncope.  Pt denies polydipsia, polyuria, or low sugar symptoms such as weakness or confusion improved with po intake.  Pt denies new neurological symptoms such as new headache, or facial or extremity weakness or numbness.   Pt states overall good compliance with meds, has been trying to follow lower cholesterol diet, with wt overall stable,  but little exercise however.Has some tingling to LUE related to left neck, but little to no pain, asks to be taken off the gabapentin. Taking alleve otc prn, in addtion to tramadol prn, needs refill.  Has regular f/u with cardiology planned.  Did have episode June 2014 pneumonia RUL, resolved Past Medical History  Diagnosis Date  . Hypertension   . Hyperlipidemia   . BPH (benign prostatic hyperplasia)   . Diverticulosis of colon (without mention of hemorrhage)   . Esophageal reflux   . Abdominal pain, epigastric   . Depressive disorder, not elsewhere classified   . Lumbago   . Allergic rhinitis, cause unspecified   . Sick sinus syndrome July 2010    s/p PPM Conservation officer, historic buildings) by [employer]  . Persistent atrial fibrillation   . Chronic anticoagulation     xarelto  . High risk medication use   . Paralyzed hemidiaphragm     LEFT  . Coronary artery calcification seen on CAT scan 12/26/2012  . Arthritis    Past Surgical History  Procedure Laterality Date  . Insert / replace / remove pacemaker  05/2009     [employer] ALTRUA 60  (serial number [id number]) dual-chamber pacemaker    reports that he quit smoking about 34 years ago. His smoking use included Cigarettes. He has a 12.5 pack-year smoking history. He has quit using smokeless tobacco. His smokeless tobacco use included Chew. He reports that he does not drink  alcohol or use illicit drugs. family history includes Arthritis in his paternal grandmother; Asthma in his sister; Cancer in his father; Diabetes in his mother; Hypertension in his brother, daughter, father, mother, sister, and son; Lung cancer in his father. No Known Allergies Current Outpatient Prescriptions on File Prior to Visit  Medication Sig Dispense Refill  . amLODipine (NORVASC) 10 MG tablet Take 10 mg by mouth daily.      Marland Kitchen atorvastatin (LIPITOR) 40 MG tablet Take 40 mg by mouth daily.      . [facility] ALLERGY RELIEF 180 MG tablet TAKE 1 TABLET (180 MG TOTAL) BY MOUTH DAILY.  90 tablet  3  . flecainide (TAMBOCOR) 100 MG tablet Take 100 mg by mouth 2 (two) times daily.      . fluticasone (FLONASE) 50 MCG/ACT nasal spray Place 2 sprays into the nose daily.  16 g  5  . latanoprost (XALATAN) 0.005 % ophthalmic solution Place 1 drop into both eyes daily.      Marland Kitchen lisinopril (PRINIVIL,ZESTRIL) 10 MG tablet Take 10 mg by mouth daily.      Marland Kitchen omeprazole (PRILOSEC) 20 MG capsule Take 20 mg by mouth daily.      Marland Kitchen oxybutynin (DITROPAN-XL) 10 MG 24 hr tablet Take 10 mg by mouth daily.      . Rivaroxaban (XARELTO) 20 MG TABS Take 20 mg by mouth daily.      . tamsulosin (  FLOMAX) 0.4 MG CAPS Take 0.4 mg by mouth daily.      . [DISCONTINUED] furosemide (LASIX) 40 MG tablet Take 1 tablet (40 mg total) by mouth daily.  30 tablet  11  . [DISCONTINUED] oxybutynin (DITROPAN-XL) 10 MG 24 hr tablet Take 1 tablet (10 mg total) by mouth daily.  90 tablet  3   No current facility-administered medications on file prior to visit.   Review of Systems  Constitutional: Negative for unexpected weight change, or unusual diaphoresis  HENT: Negative for tinnitus.   Eyes: Negative for photophobia and visual disturbance.  Respiratory: Negative for choking and stridor.   Gastrointestinal: Negative for vomiting and blood in stool.  Genitourinary: Negative for hematuria and decreased urine volume.  Musculoskeletal: Negative  for acute joint swelling Skin: Negative for color change and wound.  Neurological: Negative for tremors and numbness other than noted  Psychiatric/Behavioral: Negative for decreased concentration or  hyperactivity.       Objective:   Physical Exam BP 106/80  Pulse 60  Temp(Src) 98.1 F (36.7 C) (Oral)  Ht 5\' 9"  (1.753 m)  Wt 204 lb 6 oz (92.704 kg)  BMI 30.17 kg/m2  SpO2 95% VS noted,  Constitutional: Pt appears well-developed and well-nourished.  HENT: Head: NCAT.  Right Ear: External ear normal.  Left Ear: External ear normal.  Eyes: Conjunctivae and EOM are normal. Pupils are equal, round, and reactive to light.  Neck: Normal range of motion. Neck supple.  Cardiovascular: Normal rate and regular rhythm.   Pulmonary/Chest: Effort normal and breath sounds normal.  Abd:  Soft, NT, non-distended, + BS Neurological: Pt is alert. Not confused  Skin: Skin is warm. No erythema.  Psychiatric: Pt behavior is normal. Thought content normal.     Assessment & Plan:

## 2013-09-26 NOTE — Assessment & Plan Note (Signed)
stable overall by history and exam, recent data reviewed with pt, and pt to continue medical treatment as before,  to f/u any worsening symptoms or concerns Lab Results  Component Value Date   LDLCALC 83 12/26/2012    

## 2013-09-26 NOTE — Patient Instructions (Addendum)
You had the Prevnar pneumonia shot today OK to stop the gabapentin Please continue all other medications as before Please have the pharmacy call with any other refills you may need. Please keep your appointments with your specialists as you have planned  Please remember to sign up for My Chart if you have not done so, as this will be important to you in the future with finding out test results, communicating by private email, and scheduling acute appointments online when needed.  Please return in 3 months, or sooner if needed

## 2013-09-26 NOTE — Progress Notes (Signed)
Pre-visit discussion using our clinic review tool. No additional management support is needed unless otherwise documented below in the visit note.  

## 2013-09-26 NOTE — Assessment & Plan Note (Signed)
With improved symtpoms, to stop the gabapentin

## 2013-09-26 NOTE — Assessment & Plan Note (Signed)
stable overall by history and exam, recent data reviewed with pt, and pt to continue medical treatment as before,  to f/u any worsening symptoms or concerns BP Readings from Last 3 Encounters:  09/26/13 106/80  07/13/13 117/74  06/26/13 102/72

## 2013-09-28 ENCOUNTER — Other Ambulatory Visit: Payer: Self-pay | Admitting: Internal Medicine

## 2013-10-03 IMAGING — CR DG CHEST 2V
2 series · 2 of 2 positions shown · non-contrast
Comparison: 02/13/2013

CLINICAL DATA: Dizziness and cough.

CHEST - 2 VIEW

[w chest pa]
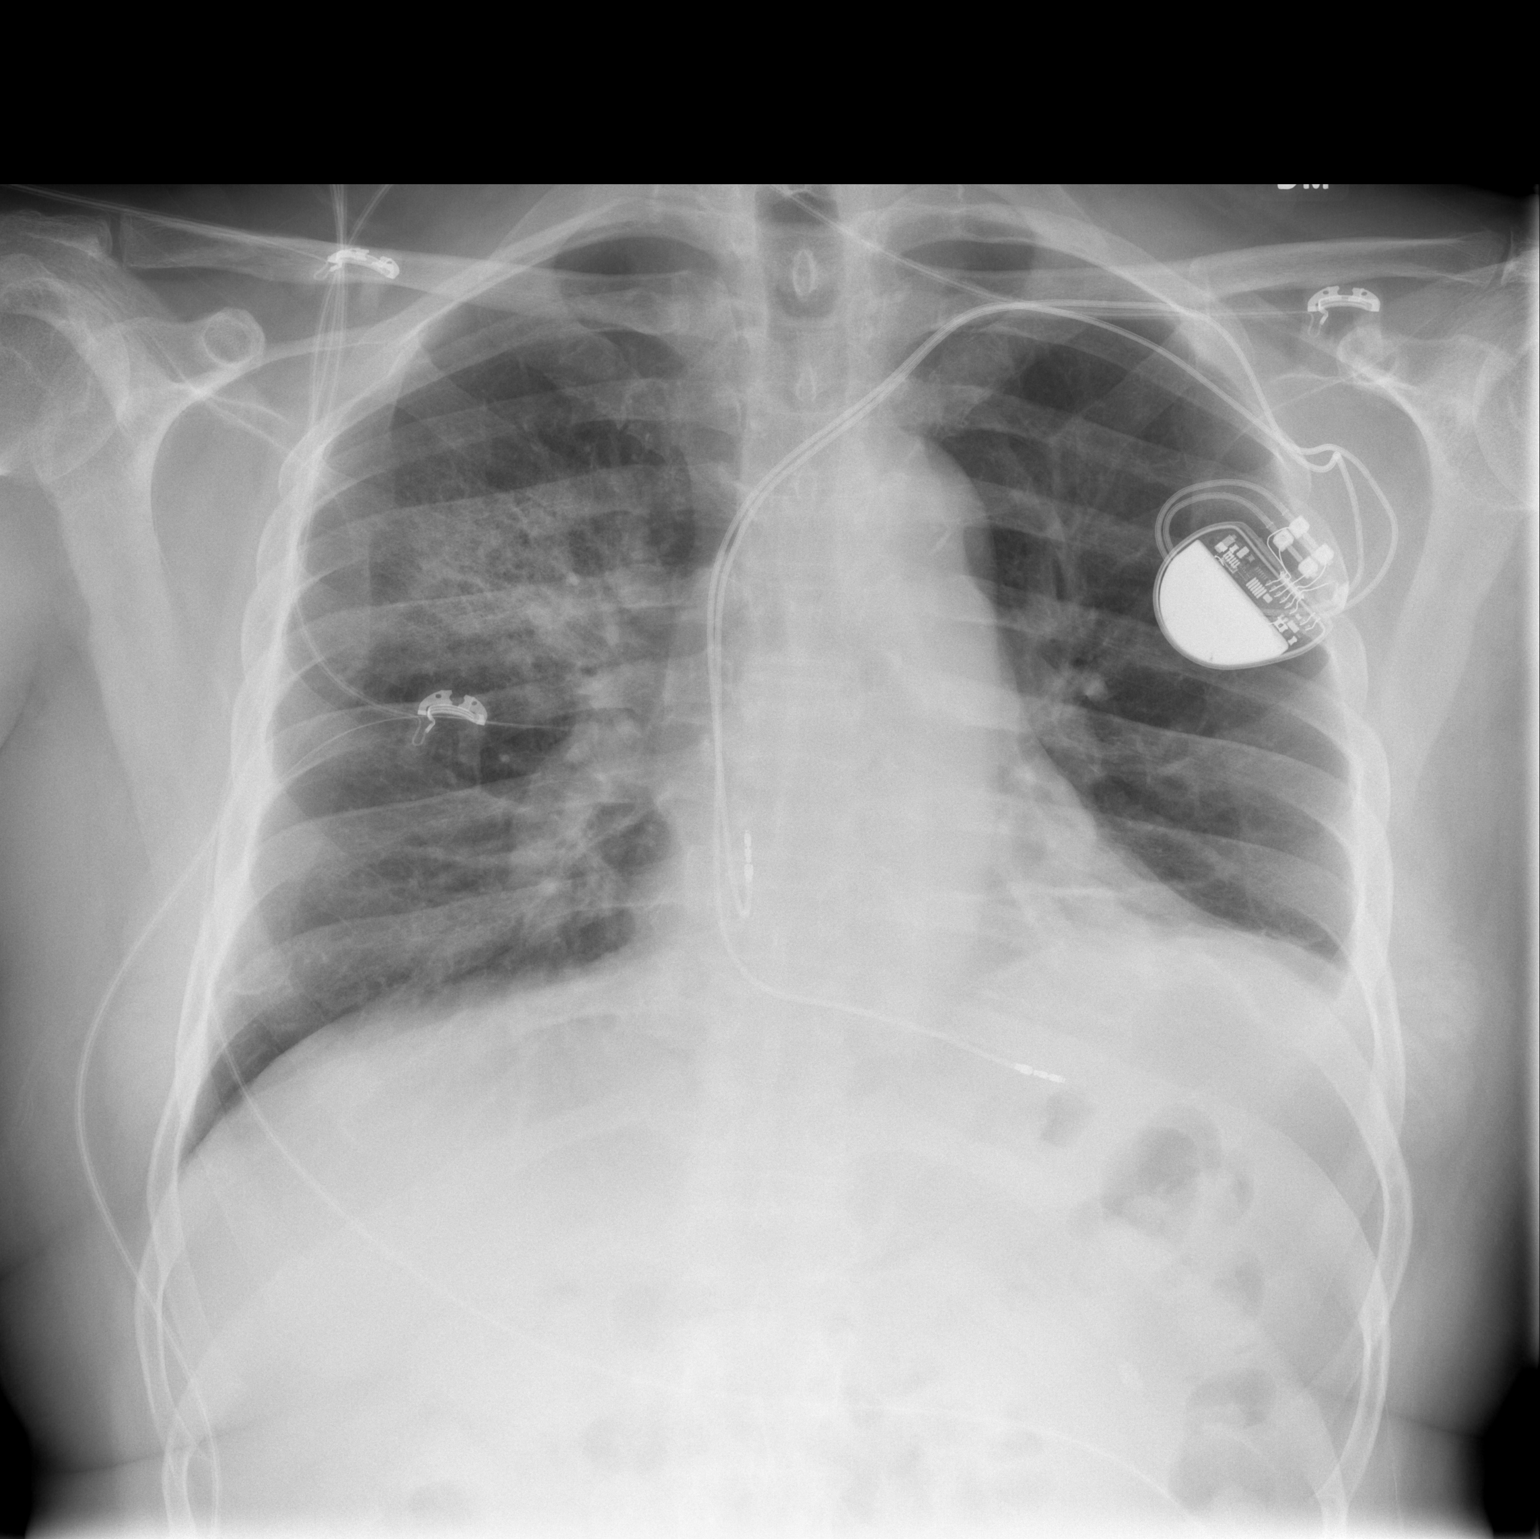

[w chest lat]
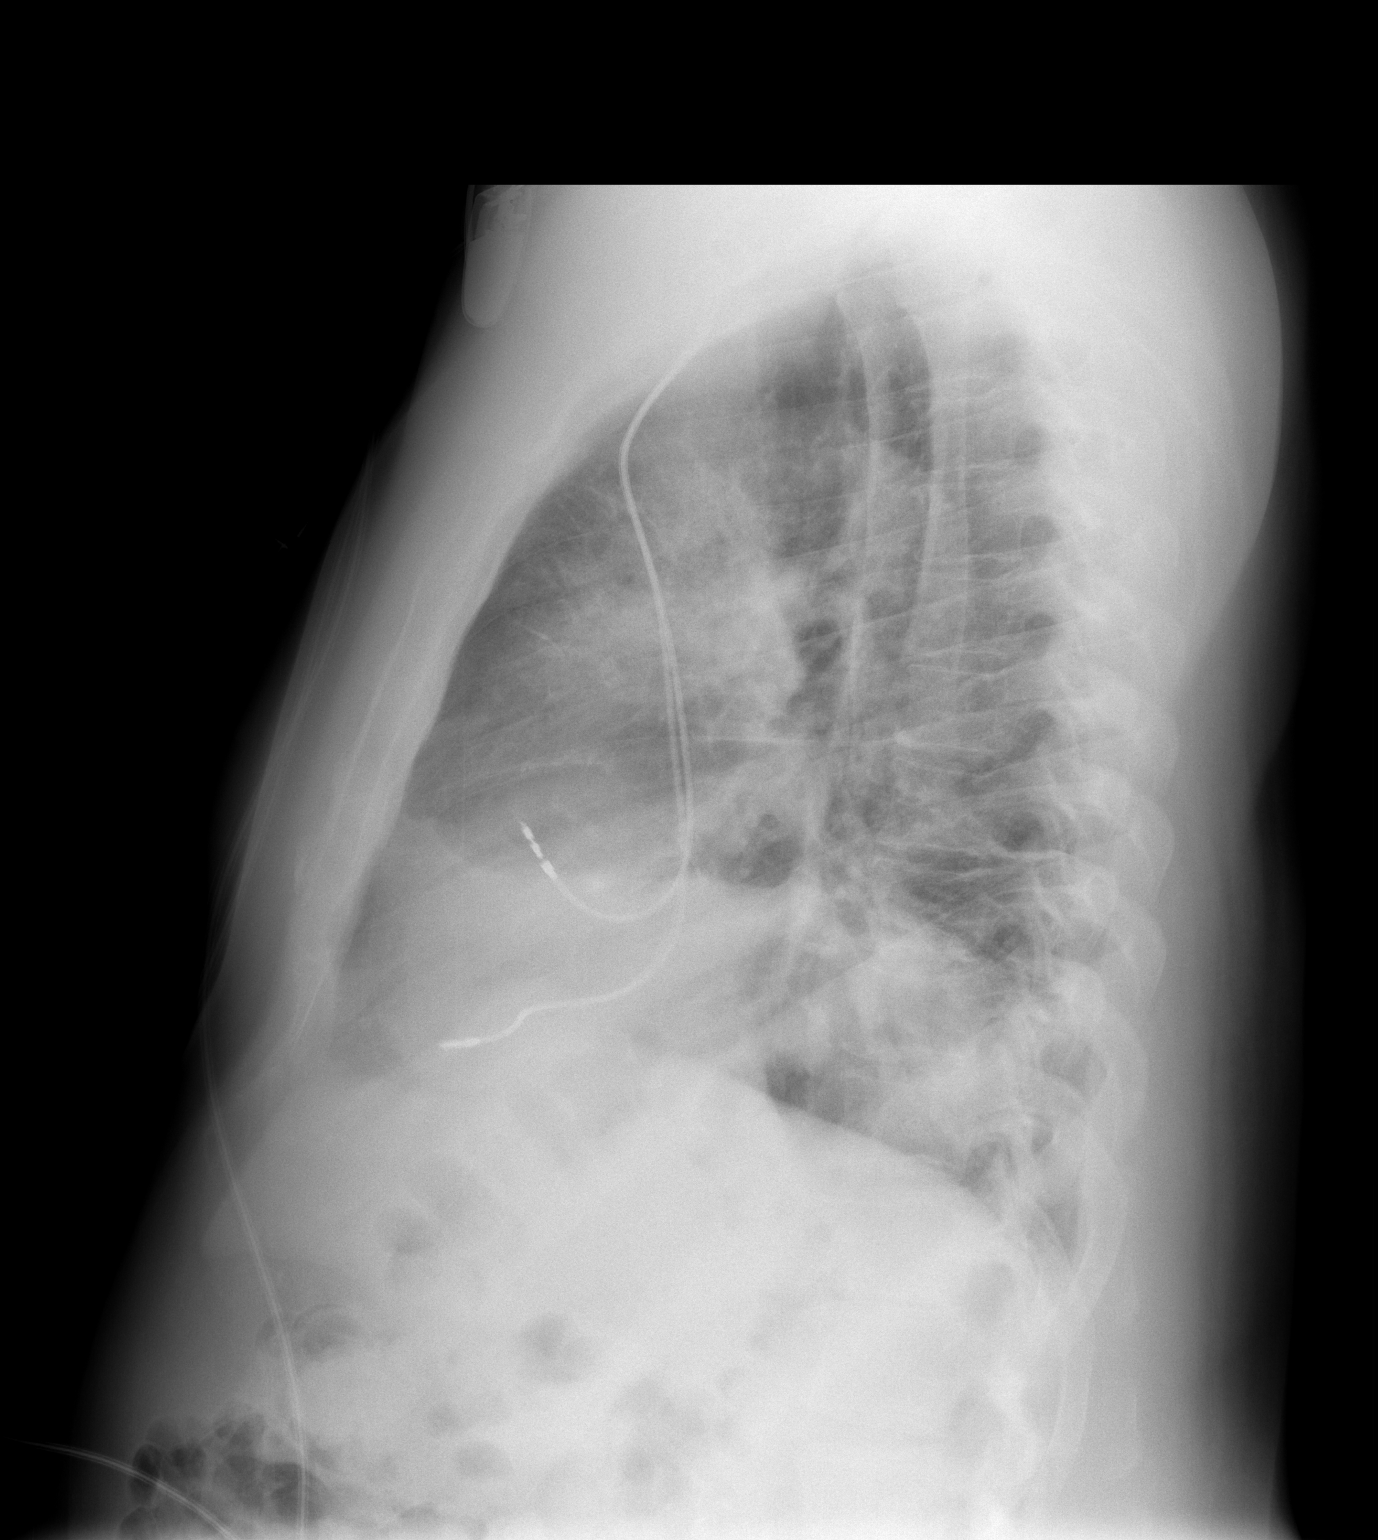

[2 of 2 positions shown; findings below may reference images not displayed]

FINDINGS: There is a new airspace infiltrate of the right upper
lobe likely representing pneumonia.  Stable scarring and chronic
atelectasis present at both lung bases.  No overt edema or
significant pleural fluid identified.  Heart size is stable.  There
is stable appearance to a dual chamber pacemaker.
IMPRESSION: New right upper lobe infiltrate.

## 2013-10-30 ENCOUNTER — Other Ambulatory Visit: Payer: Self-pay | Admitting: Internal Medicine

## 2013-11-02 ENCOUNTER — Other Ambulatory Visit: Payer: Self-pay | Admitting: Internal Medicine

## 2013-11-08 ENCOUNTER — Encounter: Payer: Self-pay | Admitting: Internal Medicine

## 2013-11-10 ENCOUNTER — Emergency Department (HOSPITAL_BASED_OUTPATIENT_CLINIC_OR_DEPARTMENT_OTHER)
Admission: EM | Admit: 2013-11-10 | Discharge: 2013-11-10 | Disposition: A | Discharge status: Home / Self Care | Payer: Medicare Other | Attending: Emergency Medicine | Admitting: Emergency Medicine

## 2013-11-10 ENCOUNTER — Emergency Department (HOSPITAL_BASED_OUTPATIENT_CLINIC_OR_DEPARTMENT_OTHER): Payer: Medicare Other

## 2013-11-10 DIAGNOSIS — Z8709 Personal history of other diseases of the respiratory system: Secondary | ICD-10-CM | POA: Diagnosis not present

## 2013-11-10 DIAGNOSIS — I10 Essential (primary) hypertension: Secondary | ICD-10-CM | POA: Insufficient documentation

## 2013-11-10 DIAGNOSIS — Z8739 Personal history of other diseases of the musculoskeletal system and connective tissue: Secondary | ICD-10-CM | POA: Insufficient documentation

## 2013-11-10 DIAGNOSIS — I2510 Atherosclerotic heart disease of native coronary artery without angina pectoris: Secondary | ICD-10-CM | POA: Diagnosis not present

## 2013-11-10 DIAGNOSIS — Z87891 Personal history of nicotine dependence: Secondary | ICD-10-CM | POA: Insufficient documentation

## 2013-11-10 DIAGNOSIS — I4891 Unspecified atrial fibrillation: Secondary | ICD-10-CM | POA: Insufficient documentation

## 2013-11-10 DIAGNOSIS — R5381 Other malaise: Secondary | ICD-10-CM | POA: Insufficient documentation

## 2013-11-10 DIAGNOSIS — K219 Gastro-esophageal reflux disease without esophagitis: Secondary | ICD-10-CM | POA: Diagnosis not present

## 2013-11-10 DIAGNOSIS — N400 Enlarged prostate without lower urinary tract symptoms: Secondary | ICD-10-CM | POA: Diagnosis not present

## 2013-11-10 DIAGNOSIS — F3289 Other specified depressive episodes: Secondary | ICD-10-CM | POA: Insufficient documentation

## 2013-11-10 DIAGNOSIS — F329 Major depressive disorder, single episode, unspecified: Secondary | ICD-10-CM | POA: Insufficient documentation

## 2013-11-10 DIAGNOSIS — R0602 Shortness of breath: Secondary | ICD-10-CM | POA: Insufficient documentation

## 2013-11-10 DIAGNOSIS — Z79899 Other long term (current) drug therapy: Secondary | ICD-10-CM | POA: Diagnosis not present

## 2013-11-10 DIAGNOSIS — IMO0002 Reserved for concepts with insufficient information to code with codable children: Secondary | ICD-10-CM | POA: Insufficient documentation

## 2013-11-10 DIAGNOSIS — Z7901 Long term (current) use of anticoagulants: Secondary | ICD-10-CM | POA: Diagnosis not present

## 2013-11-10 DIAGNOSIS — E785 Hyperlipidemia, unspecified: Secondary | ICD-10-CM | POA: Diagnosis not present

## 2013-11-10 DIAGNOSIS — R5383 Other fatigue: Secondary | ICD-10-CM

## 2013-11-10 LAB — URINALYSIS, ROUTINE W REFLEX MICROSCOPIC
Glucose, UA: NEGATIVE mg/dL
Hgb urine dipstick: NEGATIVE
Ketones, ur: NEGATIVE mg/dL
Leukocytes, UA: NEGATIVE
Nitrite: NEGATIVE
Protein, ur: NEGATIVE mg/dL

## 2013-11-10 LAB — BASIC METABOLIC PANEL
BUN: 19 mg/dL (ref 6–23)
Calcium: 9.6 mg/dL (ref 8.4–10.5)
Chloride: 103 mEq/L (ref 96–112)
Creatinine, Ser: 0.9 mg/dL (ref 0.50–1.35)
GFR calc Af Amer: >90 mL/min (ref >90)
GFR calc non Af Amer: 84 mL/min — ABNORMAL LOW (ref >90)
Sodium: 139 mEq/L (ref 135–145)

## 2013-11-10 LAB — CBC
MCH: 29.6 pg (ref 26.0–34.0)
MCHC: 34.2 g/dL (ref 30.0–36.0)
MCV: 86.5 fL (ref 78.0–100.0)
Platelets: 197 10*3/uL (ref 150–400)
RBC: 5.34 MIL/uL (ref 4.22–5.81)
RDW: 13.4 % (ref 11.5–15.5)

## 2013-11-10 LAB — PRO B NATRIURETIC PEPTIDE: Pro B Natriuretic peptide (BNP): 7.6 pg/mL (ref 0–125)

## 2013-11-10 NOTE — ED Provider Notes (Signed)
CSN: [id number]     Arrival date & time 11/10/13  8295 History   First MD Initiated Contact with Patient 11/10/13 1023     Chief Complaint  Patient presents with  . Fatigue   (Consider location/radiation/quality/duration/timing/severity/associated sxs/prior Treatment) Patient is a 72 y.o. male presenting with weakness. The history is provided by the patient.  Weakness This is a new problem. The current episode started more than 2 days ago. The problem occurs constantly. The problem has been gradually worsening. Associated symptoms include shortness of breath (on exertion, worse than baseline). Pertinent negatives include no chest pain, no abdominal pain and no headaches. Nothing aggravates the symptoms. Nothing relieves the symptoms.    Past Medical History  Diagnosis Date  . Hypertension   . Hyperlipidemia   . BPH (benign prostatic hyperplasia)   . Diverticulosis of colon (without mention of hemorrhage)   . Esophageal reflux   . Abdominal pain, epigastric   . Depressive disorder, not elsewhere classified   . Lumbago   . Allergic rhinitis, cause unspecified   . Sick sinus syndrome July 2010    s/p PPM Conservation officer, historic buildings) by [employer]  . Persistent atrial fibrillation   . Chronic anticoagulation     xarelto  . High risk medication use   . Paralyzed hemidiaphragm     LEFT  . Coronary artery calcification seen on CAT scan 12/26/2012  . Arthritis    Past Surgical History  Procedure Laterality Date  . Insert / replace / remove pacemaker  05/2009     [employer] ALTRUA 60  (serial number [id number]) dual-chamber pacemaker   Family History  Problem Relation Age of Onset  . Hypertension Father   . Lung cancer Father   . Cancer Father   . Asthma Sister   . Hypertension Sister   . Arthritis Paternal Grandmother   . Diabetes Mother   . Hypertension Mother   . Hypertension Brother   . Hypertension Daughter   . Hypertension Son    History  Substance Use Topics  . Smoking status:  Former Smoker -- 0.50 packs/day for 25 years    Types: Cigarettes    Quit date: 11/16/1978  . Smokeless tobacco: Former Neurosurgeon    Types: Chew  . Alcohol Use: No    Review of Systems  Constitutional: Negative for fever.  Respiratory: Positive for shortness of breath (on exertion, worse than baseline). Negative for cough.   Cardiovascular: Negative for chest pain.  Gastrointestinal: Negative for abdominal pain.  Neurological: Positive for weakness. Negative for headaches.  All other systems reviewed and are negative.    Allergies  Review of patient's allergies indicates no known allergies.  Home Medications   Current Outpatient Rx  Name  Route  Sig  Dispense  Refill  . amLODipine (NORVASC) 10 MG tablet   Oral   Take 10 mg by mouth daily.         Marland Kitchen atorvastatin (LIPITOR) 40 MG tablet   Oral   Take 40 mg by mouth daily.         Marland Kitchen atorvastatin (LIPITOR) 40 MG tablet      TAKE 1 TABLET (40 MG TOTAL) BY MOUTH DAILY.   90 tablet   3   . [facility] ALLERGY RELIEF 180 MG tablet      TAKE 1 TABLET (180 MG TOTAL) BY MOUTH DAILY.   90 tablet   3   . flecainide (TAMBOCOR) 100 MG tablet   Oral   Take 100 mg by mouth  2 (two) times daily.         . fluticasone (FLONASE) 50 MCG/ACT nasal spray   Nasal   Place 2 sprays into the nose daily.   16 g   5   . latanoprost (XALATAN) 0.005 % ophthalmic solution   Both Eyes   Place 1 drop into both eyes daily.         Marland Kitchen lisinopril (PRINIVIL,ZESTRIL) 10 MG tablet   Oral   Take 10 mg by mouth daily.         Marland Kitchen omeprazole (PRILOSEC) 20 MG capsule   Oral   Take 20 mg by mouth daily.         Marland Kitchen oxybutynin (DITROPAN-XL) 10 MG 24 hr tablet   Oral   Take 10 mg by mouth daily.         . Rivaroxaban (XARELTO) 20 MG TABS   Oral   Take 20 mg by mouth daily.         . tamsulosin (FLOMAX) 0.4 MG CAPS capsule      TAKE 1 CAPSULE (0.4 MG TOTAL) BY MOUTH DAILY.   90 capsule   2   . tamsulosin (FLOMAX) 0.4 MG CAPS capsule       TAKE 1 CAPSULE (0.4 MG TOTAL) BY MOUTH DAILY.   90 capsule   3   . tamsulosin (FLOMAX) 0.4 MG CAPS   Oral   Take 0.4 mg by mouth daily.         . traMADol (ULTRAM) 50 MG tablet      TAKE 1 TABLET EVERY 6 HOURS AS NEEDED FOR PAIN   120 tablet   2    BP 117/81  Pulse 65  Temp(Src) 98.3 F (36.8 C) (Oral)  Resp 18  Ht 5\' 9"  (1.753 m)  Wt 190 lb (86.183 kg)  BMI 28.05 kg/m2  SpO2 100% Physical Exam  Nursing note and vitals reviewed. Constitutional: He is oriented to person, place, and time. He appears well-developed and well-nourished. No distress.  HENT:  Head: Normocephalic and atraumatic.  Mouth/Throat: No oropharyngeal exudate.  Eyes: EOM are normal. Pupils are equal, round, and reactive to light.  Neck: Normal range of motion. Neck supple.  Cardiovascular: Normal rate and regular rhythm.  Exam reveals no friction rub.   No murmur heard. Pulmonary/Chest: Effort normal and breath sounds normal. No respiratory distress. He has no wheezes. He has no rales.  Abdominal: He exhibits no distension. There is no tenderness. There is no rebound.  Musculoskeletal: Normal range of motion. He exhibits no edema.  Neurological: He is alert and oriented to person, place, and time.  Skin: He is not diaphoretic.    ED Course  Procedures (including critical care time) Labs Review Labs Reviewed  CBC  BASIC METABOLIC PANEL  LACTIC ACID, PLASMA  PRO B NATRIURETIC PEPTIDE  URINALYSIS, ROUTINE W REFLEX MICROSCOPIC   Imaging Review Dg Chest 2 View  11/10/2013   CLINICAL DATA:  Fatigue, shortness of breath.  EXAM: CHEST  2 VIEW  COMPARISON:  PA and lateral chest 05/06/2013, 01/15/2012 and 02/13/2013. CT chest 01/20/2012.  FINDINGS: Chronic scarring is seen the left lung base, unchanged. The lungs are otherwise clear. Heart size is normal. No pneumothorax or pleural effusion. Pacing device is unchanged.  IMPRESSION: No acute disease.   Electronically Signed   By: Drusilla Kanner  M.D.   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 Fatigue    72 year old male presents with fatigue. For past 3 days. He has worsening dyspnea on exertion. He has some baseline, but is partial opacification. Denies any fevers, chest pain, abdominal pain. He does state some mild rumbling in his abdomen after eating foods, but is having nausea, vomiting, diarrhea. He does have history of sick sinus syndrome with pacemaker. EKG reported possible pacemaker failure, pacemaker spikes are clearly seen. EKG is normal. Exam is benign. He states he felt like this when he had pneumonia previously. We'll do labs, cardiac workup, chest x-ray. Labs normal. CXR normal. Pacer interrogation normal. I spoke with his PCP, Dr. Jonny Ruiz, who stated no further workup needed and he will see him in 3 days. Stable for discharge.    Dagmar Hait, MD 11/10/13 [phone]

## 2013-11-10 NOTE — Telephone Encounter (Signed)
Msg given to scheduler [name]) to call pt to make appt...[name]

## 2013-11-10 NOTE — ED Notes (Signed)
Spoke with [facility] about coming to ed to test pacemaker. Given directions to ed, state they will be here shortly.

## 2013-11-10 NOTE — ED Notes (Signed)
Pt lying supine with resp easy and reg, reports 4 days of fatigue and increased doe. Pt states "It feels like I have something stuck in my throat." denies any chest pain, pressure or tightness. ekg in progress while pt being triage.

## 2013-11-14 ENCOUNTER — Other Ambulatory Visit (INDEPENDENT_AMBULATORY_CARE_PROVIDER_SITE_OTHER): Payer: Medicare Other

## 2013-11-14 ENCOUNTER — Encounter: Payer: Self-pay | Admitting: Internal Medicine

## 2013-11-14 ENCOUNTER — Ambulatory Visit (INDEPENDENT_AMBULATORY_CARE_PROVIDER_SITE_OTHER): Payer: Medicare Other | Admitting: Internal Medicine

## 2013-11-14 VITALS — BP 122/80 | HR 64 | Temp 99.0°F | Ht 69.0 in | Wt 192.6 lb

## 2013-11-14 DIAGNOSIS — R109 Unspecified abdominal pain: Secondary | ICD-10-CM

## 2013-11-14 DIAGNOSIS — R399 Unspecified symptoms and signs involving the genitourinary system: Secondary | ICD-10-CM

## 2013-11-14 DIAGNOSIS — R634 Abnormal weight loss: Secondary | ICD-10-CM | POA: Diagnosis not present

## 2013-11-14 DIAGNOSIS — R3989 Other symptoms and signs involving the genitourinary system: Secondary | ICD-10-CM | POA: Diagnosis not present

## 2013-11-14 DIAGNOSIS — R0602 Shortness of breath: Secondary | ICD-10-CM | POA: Diagnosis not present

## 2013-11-14 DIAGNOSIS — N400 Enlarged prostate without lower urinary tract symptoms: Secondary | ICD-10-CM

## 2013-11-14 MED ORDER — TAMSULOSIN HCL 0.4 MG PO CAPS: ORAL_CAPSULE | ORAL | Status: DC

## 2013-11-14 MED ORDER — PANTOPRAZOLE SODIUM 40 MG PO TBEC: 40.0000 mg | DELAYED_RELEASE_TABLET | Freq: Every day | ORAL | Status: DC

## 2013-11-14 NOTE — Progress Notes (Signed)
Subjective:    Patient ID: [name], male    DOB: Nov 17, 1940, 72 y.o.   MRN: [id number]  HPI  Here to f/u recent ER visit, has chronic mild dyspnea no change, recent cmp, cbc, UA, cxr without acute.  But states today was there more for abd sensations ongoing for several months, sort of a mid abd "rolling and rumbling" without radiation to the back, n/v, blood but has had significant wt loss, with wt 215 in feb 2014 - lost unintentional, assoc with lower appetite.  Also with ongoing mild constipation.  Has seen Dr Juanda Chance in past with colonoscopy 2009.  Last CT abd/pelvis may 2014 for wt loss without acute abnormality.  No prior EGD.  Has ongoing statin use, and takes prn otc prilosec as well Past Medical History  Diagnosis Date  . Hypertension   . Hyperlipidemia   . BPH (benign prostatic hyperplasia)   . Diverticulosis of colon (without mention of hemorrhage)   . Esophageal reflux   . Abdominal pain, epigastric   . Depressive disorder, not elsewhere classified   . Lumbago   . Allergic rhinitis, cause unspecified   . Sick sinus syndrome July 2010    s/p PPM Conservation officer, historic buildings) by [employer]  . Persistent atrial fibrillation   . Chronic anticoagulation     xarelto  . High risk medication use   . Paralyzed hemidiaphragm     LEFT  . Coronary artery calcification seen on CAT scan 12/26/2012  . Arthritis    Past Surgical History  Procedure Laterality Date  . Insert / replace / remove pacemaker  05/2009     [employer] ALTRUA 60  (serial number [id number]) dual-chamber pacemaker    reports that he quit smoking about 35 years ago. His smoking use included Cigarettes. He has a 12.5 pack-year smoking history. He has quit using smokeless tobacco. His smokeless tobacco use included Chew. He reports that he does not drink alcohol or use illicit drugs. family history includes Arthritis in his paternal grandmother; Asthma in his sister; Cancer in his father; Diabetes in his mother; Hypertension in his  brother, daughter, father, mother, sister, and son; Lung cancer in his father. No Known Allergies Current Outpatient Prescriptions on File Prior to Visit  Medication Sig Dispense Refill  . amLODipine (NORVASC) 10 MG tablet Take 10 mg by mouth daily.      . [facility] ALLERGY RELIEF 180 MG tablet TAKE 1 TABLET (180 MG TOTAL) BY MOUTH DAILY.  90 tablet  3  . flecainide (TAMBOCOR) 100 MG tablet Take 100 mg by mouth 2 (two) times daily.      . fluticasone (FLONASE) 50 MCG/ACT nasal spray Place 2 sprays into the nose daily.  16 g  5  . latanoprost (XALATAN) 0.005 % ophthalmic solution Place 1 drop into both eyes daily.      Marland Kitchen lisinopril (PRINIVIL,ZESTRIL) 10 MG tablet Take 10 mg by mouth daily.      Marland Kitchen oxybutynin (DITROPAN-XL) 10 MG 24 hr tablet Take 10 mg by mouth daily.      . Rivaroxaban (XARELTO) 20 MG TABS Take 20 mg by mouth daily.      . traMADol (ULTRAM) 50 MG tablet TAKE 1 TABLET EVERY 6 HOURS AS NEEDED FOR PAIN  120 tablet  2  . [DISCONTINUED] furosemide (LASIX) 40 MG tablet Take 1 tablet (40 mg total) by mouth daily.  30 tablet  11  . [DISCONTINUED] oxybutynin (DITROPAN-XL) 10 MG 24 hr tablet Take 1 tablet (10  mg total) by mouth daily.  90 tablet  3   No current facility-administered medications on file prior to visit.   Review of Systems  Constitutional: Negative for unexpected weight change, or unusual diaphoresis  HENT: Negative for tinnitus.   Eyes: Negative for photophobia and visual disturbance.  Respiratory: Negative for choking and stridor.   Gastrointestinal: Negative for vomiting and blood in stool.  Genitourinary: Negative for hematuria and decreased urine volume.  Musculoskeletal: Negative for acute joint swelling Skin: Negative for color change and wound.  Neurological: Negative for tremors and numbness other than noted  Psychiatric/Behavioral: Negative for decreased concentration or  hyperactivity.       Objective:   Physical Exam BP 122/80  Pulse 64  Temp(Src) 99 F  (37.2 C) (Oral)  Ht 5\' 9"  (1.753 m)  Wt 192 lb 9 oz (87.346 kg)  BMI 28.42 kg/m2  SpO2 98% VS noted,  Constitutional: Pt appears well-developed and well-nourished.  HENT: Head: NCAT.  Right Ear: External ear normal.  Left Ear: External ear normal.  Eyes: Conjunctivae and EOM are normal. Pupils are equal, round, and reactive to light.  Neck: Normal range of motion. Neck supple.  Cardiovascular: Normal rate and regular rhythm.   Pulmonary/Chest: Effort normal and breath sounds normal.  Abd:  Soft, NT, non-distended, + BS except for mild tender with deeper palpation to mid abd Neurological: Pt is alert. Not confused  Skin: Skin is warm. No erythema.  Psychiatric: Pt behavior is normal. Thought content normal.     Assessment & Plan:

## 2013-11-14 NOTE — Progress Notes (Signed)
Pre-visit discussion using our clinic review tool. No additional management support is needed unless otherwise documented below in the visit note.  

## 2013-11-14 NOTE — Assessment & Plan Note (Signed)
chornic stable,  to f/u any worsening symptoms or concerns 

## 2013-11-14 NOTE — Assessment & Plan Note (Signed)
Also with mild increased symtpoms- for incr flomax bid, refer urology

## 2013-11-14 NOTE — Assessment & Plan Note (Addendum)
?   Etiology, for now to d/c otc prilosec, start protonix 40, also d/c lipitor and check lipase to r/o pancreatitis, hold on further imaging such as CT, ensure for nutrition, and refer GI  ? Need egd and/or f/u colonscopy.   Note:  Total time for pt hx, exam, review of record with pt in the room, determination of diagnoses and plan for further eval and tx is > 40 min, with over 50% spent in coordination and counseling of patient

## 2013-11-14 NOTE — Patient Instructions (Addendum)
OK to stop the lipitor and the prilosec Please take all new medication as prescribed - the protonix (generic) OK to increase the flomax to .4 mg twice per day Please go to the LAB in the Basement (turn left off the elevator) for the tests to be done today - just the lipase today We will hold off on the CT scan for now [facility] to take Ensure for nutrition if you continue to lose wt You will be contacted regarding the referral for: Gastroenterology - Dr Juanda Chance, and urology  Please remember to sign up for My Chart if you have not done so, as this will be important to you in the future with finding out test results, communicating by private email, and scheduling acute appointments online when needed.  Please return in 3 months

## 2013-11-14 NOTE — Assessment & Plan Note (Signed)
Wt 215 last feb 2014, refer GI as above

## 2013-11-17 ENCOUNTER — Other Ambulatory Visit: Payer: Self-pay | Admitting: *Deleted

## 2013-11-17 ENCOUNTER — Telehealth: Payer: Self-pay | Admitting: *Deleted

## 2013-11-17 ENCOUNTER — Ambulatory Visit (INDEPENDENT_AMBULATORY_CARE_PROVIDER_SITE_OTHER): Payer: Medicare Other | Admitting: Physician Assistant

## 2013-11-17 ENCOUNTER — Ambulatory Visit (INDEPENDENT_AMBULATORY_CARE_PROVIDER_SITE_OTHER)
Admission: RE | Admit: 2013-11-17 | Discharge: 2013-11-17 | Disposition: A | Discharge status: Home / Self Care | Payer: Medicare Other | Source: Ambulatory Visit | Attending: Physician Assistant | Admitting: Physician Assistant

## 2013-11-17 ENCOUNTER — Encounter: Payer: Self-pay | Admitting: Physician Assistant

## 2013-11-17 VITALS — BP 110/70 | HR 64 | Ht 69.0 in | Wt 192.0 lb

## 2013-11-17 DIAGNOSIS — R1013 Epigastric pain: Secondary | ICD-10-CM

## 2013-11-17 DIAGNOSIS — R634 Abnormal weight loss: Secondary | ICD-10-CM | POA: Diagnosis not present

## 2013-11-17 DIAGNOSIS — K5730 Diverticulosis of large intestine without perforation or abscess without bleeding: Secondary | ICD-10-CM | POA: Diagnosis not present

## 2013-11-17 DIAGNOSIS — N400 Enlarged prostate without lower urinary tract symptoms: Secondary | ICD-10-CM | POA: Diagnosis not present

## 2013-11-17 DIAGNOSIS — F5000 Anorexia nervosa, unspecified: Secondary | ICD-10-CM

## 2013-11-17 MED ORDER — IOHEXOL 300 MG/ML  SOLN
100.0000 mL | Freq: Once | INTRAMUSCULAR | Status: AC | PRN
  Administered 2013-11-17: 100 mL via INTRAVENOUS

## 2013-11-17 MED ORDER — PANTOPRAZOLE SODIUM 40 MG PO TBEC: 40.0000 mg | DELAYED_RELEASE_TABLET | Freq: Two times a day (BID) | ORAL | Status: DC

## 2013-11-17 NOTE — Progress Notes (Signed)
Subjective:    Patient ID: [name], male    DOB: 1941-09-24, 73 y.o.   MRN: [id number]  HPI  Jadian is a pleasant 73 year old African American male known to Dr. Delfin Edis. 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 Last EGD was done in 2005 showing chronic active gastritis. Colonoscopy in 2009 showed multiple very small polyps all of which were hyperplastic and universal diverticulosis. Patient comes in today with complaints of lack of appetite and rolling in discomfort in his "stomach". He denies any real abdominal pain but says he has an unsettled feeling in his upper abdomen as well as increase scattered gas and belching. He has not had any nausea or vomiting no fever or chills no diarrhea or melena. Says she's been feeling poorly over the past couple of months and has lost about 17 pounds. He is able to eat but just has no appetite. He denies any significant heartburn or indigestion. He has been on Protonix 40 mg by mouth daily. He has not started any new medications in November when his symptoms started. However he says he had been taking Aleve at least a couple every day for back pain which he stopped towards the end of November. He says at the onset of his symptoms several weeks ago is he had some chills and then some initially right-sided and left-sided mid and lower back discomfort which has been very vague and intermittent since. He states he has not used any alcohol in the past 20+ years. Patient was seen by Dr. Cathlean Cower earlier this week and had a CBC ,CMET, and lipase all of which were unrevealing.    Review of Systems  Constitutional: Positive for appetite change, fatigue and unexpected weight change.  HENT: Negative.   Eyes:  Negative.   Respiratory: Positive for shortness of breath.   Cardiovascular: Negative.   Gastrointestinal: Positive for nausea and abdominal pain.  Endocrine: Negative.   Genitourinary: Negative.   Musculoskeletal: Positive for back pain.  Allergic/Immunologic: Negative.   Neurological: Negative.   Hematological: Negative.   Psychiatric/Behavioral: Negative.    Outpatient Prescriptions Prior to Visit  Medication Sig Dispense Refill  . amLODipine (NORVASC) 10 MG tablet Take 10 mg by mouth daily.      . [facility] ALLERGY RELIEF 180 MG tablet TAKE 1 TABLET (180 MG TOTAL) BY MOUTH DAILY.  90 tablet  3  . flecainide (TAMBOCOR) 100 MG tablet Take 100 mg by mouth 2 (two) times daily.      . fluticasone (FLONASE) 50 MCG/ACT nasal spray Place 2 sprays into the nose daily.  16 g  5  . latanoprost (XALATAN) 0.005 % ophthalmic solution Place 1 drop into both eyes daily.      Marland Kitchen lisinopril (PRINIVIL,ZESTRIL) 10 MG tablet Take 10 mg by mouth daily.      Marland Kitchen oxybutynin (DITROPAN-XL) 10 MG 24 hr tablet Take 10 mg by mouth daily.      . Rivaroxaban (XARELTO) 20 MG TABS Take 20 mg by mouth daily.      . tamsulosin (FLOMAX) 0.4 MG CAPS capsule Take 0.4 mg by mouth twice per day  60 capsule  11  . traMADol (ULTRAM) 50 MG tablet TAKE 1 TABLET EVERY 6 HOURS AS NEEDED FOR PAIN  120 tablet  2  .  pantoprazole (PROTONIX) 40 MG tablet Take 1 tablet (40 mg total) by mouth daily.  90 tablet  3   No facility-administered medications prior to visit.   No Known Allergies Patient Active Problem List   Diagnosis Date Noted  . Abdominal  pain, other specified site 11/14/2013  . Near syncope 05/06/2013  . abnormal (EKG) 05/06/2013  . Dry mouth 03/02/2013  . Coronary artery calcification seen on CAT scan 12/26/2012  . Lumbar back pain 12/26/2012  . Varicose veins of lower extremities with other complications 25/36/6440  . Leg pain, right 04/17/2012  . Leg swelling 04/17/2012  . DOE (dyspnea on exertion) 02/26/2012  .  Preventative health care 12/24/2011  . Cervical radiculopathy 12/24/2011  . 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 Shortness of breath 12/18/2010  . SYNCOPE 12/04/2010  . FREQUENCY, URINARY 11/24/2010  . NECK PAIN, RIGHT 08/21/2010  . Atrial fibrillation 05/29/2010  . Sick sinus syndrome 05/20/2010  . NAUSEA AND VOMITING 05/20/2010  . Rhabdomyolysis 03/25/2010  . SHOULDER PAIN, RIGHT 12/09/2009  . ACHILLES TENDINITIS 12/09/2009  . BENIGN PROSTATIC HYPERTROPHY 06/26/2009  . WEIGHT LOSS-ABNORMAL 06/05/2009  . NAUSEA 06/05/2009  . PERSONAL HX COLONIC POLYPS 06/05/2009  . BRADYCARDIA 05/27/2009  . FATIGUE 05/27/2009  . HYPERLIPIDEMIA 05/30/2008  . DEPRESSION 05/30/2008  . HYPERTENSION 05/30/2008  . ALLERGIC RHINITIS 05/30/2008  . GERD 05/30/2008  . Diverticulosis of colon (without mention of hemorrhage) 05/30/2008  . OVERACTIVE BLADDER 05/30/2008  . LOW BACK PAIN 05/30/2008   History  Substance Use Topics  . Smoking status: Former Smoker -- 0.50 packs/day for 25 years    Types: Cigarettes    Quit date: 11/16/1978  . Smokeless tobacco: Former Systems developer    Types: Chew  . Alcohol Use: No      family history includes Arthritis in his paternal grandmother; Asthma in his sister; Cancer in his father; Diabetes in his mother; Hypertension in his brother, daughter, father, mother, sister, and son; Lung cancer in his father.  Objective:   Physical Exam   Well-developed older African American male in no acute distress, accompanied by his wife blood pressure 110/70 pulse 64 height 5 foot 9 weight 192. HEENT; nontraumatic normocephalic EOMI PERRLA ;sclera anicteric, Supple; no JVD, Cardiovascular ;regular rate and rhythm with S1-S2 does have a soft murmur, pacemaker and left chest wall, Pulmonary; clear bilaterally, Abdomen ;soft bowel sounds are present no audible bruit no palpable mass or hepatosplenomegaly no real abdominal tenderness, Rectal exam not done, Extremities; no clubbing cyanosis or  edema skin warm and dry, Psych; mood and affect normal and appropriate       Assessment & Plan:  #11  73 year old male with upper abdominal discomfort," rolling", dyspepsia ,anorexia and weight loss X2 months. Etiology is not clear. He may have an NSAID-induced ulcer, gastritis but also need to rule out occult malignancy. Is #2 atrial fibrillation on anticoagulation with Xarelto #3 sick sinus syndrome status post pacemaker placement #4 hypertension #5 hyperlipidemia #6 cervical disc disease #7 diverticulosis #8 remote history of pancreatitis  Plan; patient will stay off of NSAIDs Increase Protonix to 40 mg by mouth twice daily Encouraged small frequent feedings ,bland foods Schedule for CT scan of the abdomen and pelvis with contrast Schedule for upper endoscopy with Dr. Olevia Perches procedure discussed in detail with the patient and he is agreeable to proceed. Will need to hold Xarelto  For at least 24 hours prior to the procedure and we'll obtain consent from Dr. Rayann Heman his cardiologist-

## 2013-11-17 NOTE — Progress Notes (Signed)
Reviewed and agree. If he has to stay on Xarelta, then we can still do the EGD but only limited biopsies.

## 2013-11-17 NOTE — Patient Instructions (Signed)
We sent a prescription for twice daily Protonix 40 mg.  Take twice daily for 30 days.  Call us when you are on the last week and we can send a once daily prescription for you.  We scheduled you with Dr. Olevia Perches at our [facility] Endoscopy unit for 12-05-2013. Instructions provided.    You have been scheduled for a CT scan of the abdomen and pelvis at [facility] ([address]---this is in the same building as Press photographer).   You are scheduled today  at 2:30 PM . You should arrive at 2:15 PM  prior to your appointment time for registration. Please follow the written instructions below on the day of your exam:  WARNING: IF YOU ARE ALLERGIC TO IODINE/X-RAY DYE, PLEASE NOTIFY RADIOLOGY IMMEDIATELY AT [phone]! YOU WILL BE GIVEN A 13 HOUR PREMEDICATION PREP.  1) Do not eat or drink anything until after the test. 2) You have been given 2 bottles of oral contrast to drink. The solution may taste  better if refrigerated, but do NOT add ice or any other liquid to this solution. Shake well before drinking.    Drink 1 bottle of contrast @ 12:30 PM (2 hours prior to your exam)  Drink 1 bottle of contrast @ 1:30 PM  (1 hour prior to your exam)  The purpose of you drinking the oral contrast is to aid in the visualization of your intestinal tract. The contrast solution may cause some diarrhea. Before your exam is started, you will be given a small amount of fluid to drink. Depending on your individual set of symptoms, you may also receive an intravenous injection of x-ray contrast/dye. Plan on being at [facility] for 30 minutes or long, depending on the type of exam you are having performed.  If you have any questions regarding your exam or if you need to reschedule, you may call the CT department at [phone] between the hours of 8:00 am and 5:00 pm, Monday-Friday.  ________________________________________________________________________

## 2013-11-17 NOTE — Telephone Encounter (Signed)
11/17/2013   RE: GUERIN LASHOMB DOB: Mar 23, 1941 MRN: [id number]   Dear Tonia Brooms Allred,    We have scheduled the above patient for an endoscopic procedure. Our records show that he is on anticoagulation therapy.   Please advise as to how long the patient may come off his therapy of Xarelto prior to the procedure, which is scheduled for 12-05-2013 with Dr. Delfin Edis.  Please fax back/ or route the completed form to [phone] at [place].   Sincerely,    Amy Esterwood PA-C    Marisue Humble CMA

## 2013-11-20 ENCOUNTER — Encounter (HOSPITAL_COMMUNITY): Payer: Self-pay | Admitting: *Deleted

## 2013-11-20 ENCOUNTER — Encounter (HOSPITAL_COMMUNITY): Payer: Self-pay | Admitting: Pharmacy Technician

## 2013-11-20 NOTE — Telephone Encounter (Signed)
Hold xarelto for 48 hours prior to the procedure and resume 24 hours afterwards

## 2013-11-21 ENCOUNTER — Telehealth: Payer: Self-pay | Admitting: *Deleted

## 2013-11-21 DIAGNOSIS — R3989 Other symptoms and signs involving the genitourinary system: Secondary | ICD-10-CM | POA: Diagnosis not present

## 2013-11-21 DIAGNOSIS — N318 Other neuromuscular dysfunction of bladder: Secondary | ICD-10-CM | POA: Diagnosis not present

## 2013-11-21 DIAGNOSIS — R350 Frequency of micturition: Secondary | ICD-10-CM | POA: Diagnosis not present

## 2013-11-21 NOTE — Telephone Encounter (Signed)
See note dated 11-21-2013.

## 2013-11-21 NOTE — Telephone Encounter (Signed)
Called pt to advise him we heard from Dr. Rayann Heman. He won't take the Xarelto on 1-18,1-19, 1-20 .  He can resume it on 12-06-2013. Patient verbalized understanding of instructions.

## 2013-11-23 ENCOUNTER — Telehealth: Payer: Self-pay | Admitting: Internal Medicine

## 2013-11-23 NOTE — Telephone Encounter (Signed)
New message     Pt was seen at an urgent care facility in 12-26.  He said a Industrial/product designer rep came and "tweeked" his pacemaker.  He does not think it is right.  He want to talk to our device people

## 2013-11-23 NOTE — Telephone Encounter (Signed)
Spoke with patient.  He has had a loss of appetite since November with weight loss of 14 lbs.  He was seen in the urgent care 12/26 and his device was checked by industry and reprogrammed.  He is following up with [place] in office 1/12 for a device recheck and is scheduled for a GI work up.

## 2013-11-27 ENCOUNTER — Ambulatory Visit (INDEPENDENT_AMBULATORY_CARE_PROVIDER_SITE_OTHER): Payer: Medicare Other | Admitting: *Deleted

## 2013-11-27 DIAGNOSIS — I4891 Unspecified atrial fibrillation: Secondary | ICD-10-CM | POA: Diagnosis not present

## 2013-11-27 LAB — MDC_IDC_ENUM_SESS_TYPE_INCLINIC
Battery Remaining Longevity: 60 mo
Brady Statistic RA Percent Paced: 73 %
Brady Statistic RV Percent Paced: 7 %
Implantable Pulse Generator Serial Number: 764370
Lead Channel Impedance Value: 670 Ohm
Lead Channel Pacing Threshold Amplitude: 0.8 V
Lead Channel Pacing Threshold Amplitude: 1.2 V
Lead Channel Pacing Threshold Pulse Width: 0.4 ms
Lead Channel Pacing Threshold Pulse Width: 1 ms
Lead Channel Sensing Intrinsic Amplitude: 7.5 mV
Lead Channel Setting Pacing Amplitude: 2.5 V
Lead Channel Setting Pacing Pulse Width: 0.4 ms
Lead Channel Setting Sensing Sensitivity: 2.5 mV
MDC IDC MSMT LEADCHNL RA IMPEDANCE VALUE: 500 Ohm
MDC IDC MSMT LEADCHNL RA SENSING INTR AMPL: 1.5 mV
MDC IDC SET LEADCHNL RA PACING AMPLITUDE: 2 V

## 2013-11-27 NOTE — Progress Notes (Signed)
Pacemaker check in clinic. Normal device function. Thresholds, sensing, impedances consistent with previous measurements. Device programmed to maximize longevity. No mode switch or high ventricular rates noted. Frequent PAC's, + xarelto.  Device programmed at appropriate safety margins. Histogram distribution appropriate for patient activity level. Device programmed to optimize intrinsic conduction. Estimated longevity 5 years. Patient education completed.  ROV 6 months with Dr. Rayann Heman.

## 2013-11-28 ENCOUNTER — Encounter: Payer: Medicare Other | Admitting: Internal Medicine

## 2013-12-01 DIAGNOSIS — H2510 Age-related nuclear cataract, unspecified eye: Secondary | ICD-10-CM | POA: Diagnosis not present

## 2013-12-01 DIAGNOSIS — H4011X0 Primary open-angle glaucoma, stage unspecified: Secondary | ICD-10-CM | POA: Diagnosis not present

## 2013-12-01 NOTE — Interval H&P Note (Signed)
History and Physical Interval Note:  12/01/2013 10:40 PM  [name]  has presented today for surgery, with the diagnosis of anorexia Weight loss Upper abdominal pain  The various methods of treatment have been discussed with the patient and family. After consideration of risks, benefits and other options for treatment, the patient has consented to  Procedure(s): ESOPHAGOGASTRODUODENOSCOPY (EGD) (N/A) as a surgical intervention .  The patient's history has been reviewed, patient examined, no change in status, stable for surgery.  I have reviewed the patient's chart and labs.  Questions were answered to the patient's satisfaction.     Delfin Edis

## 2013-12-01 NOTE — H&P (View-Only) (Signed)
Subjective:    Patient ID: [name], male    DOB: 1941-09-24, 73 y.o.   MRN: [id number]  HPI  [name] is a pleasant 73 year old African American male known to Dr. Delfin Edis. 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 Last EGD was done in 2005 showing chronic active gastritis. Colonoscopy in 2009 showed multiple very small polyps all of which were hyperplastic and universal diverticulosis. Patient comes in today with complaints of lack of appetite and rolling in discomfort in his "stomach". He denies any real abdominal pain but says he has an unsettled feeling in his upper abdomen as well as increase scattered gas and belching. He has not had any nausea or vomiting no fever or chills no diarrhea or melena. Says she's been feeling poorly over the past couple of months and has lost about 17 pounds. He is able to eat but just has no appetite. He denies any significant heartburn or indigestion. He has been on Protonix 40 mg by mouth daily. He has not started any new medications in November when his symptoms started. However he says he had been taking Aleve at least a couple every day for back pain which he stopped towards the end of November. He says at the onset of his symptoms several weeks ago is he had some chills and then some initially right-sided and left-sided mid and lower back discomfort which has been very vague and intermittent since. He states he has not used any alcohol in the past 20+ years. Patient was seen by Dr. Cathlean Cower earlier this week and had a CBC ,CMET, and lipase all of which were unrevealing.    Review of Systems  Constitutional: Positive for appetite change, fatigue and unexpected weight change.  HENT: Negative.   Eyes:  Negative.   Respiratory: Positive for shortness of breath.   Cardiovascular: Negative.   Gastrointestinal: Positive for nausea and abdominal pain.  Endocrine: Negative.   Genitourinary: Negative.   Musculoskeletal: Positive for back pain.  Allergic/Immunologic: Negative.   Neurological: Negative.   Hematological: Negative.   Psychiatric/Behavioral: Negative.    Outpatient Prescriptions Prior to Visit  Medication Sig Dispense Refill  . amLODipine (NORVASC) 10 MG tablet Take 10 mg by mouth daily.      . [employer] ALLERGY RELIEF 180 MG tablet TAKE 1 TABLET (180 MG TOTAL) BY MOUTH DAILY.  90 tablet  3  . flecainide (TAMBOCOR) 100 MG tablet Take 100 mg by mouth 2 (two) times daily.      . fluticasone (FLONASE) 50 MCG/ACT nasal spray Place 2 sprays into the nose daily.  16 g  5  . latanoprost (XALATAN) 0.005 % ophthalmic solution Place 1 drop into both eyes daily.      Marland Kitchen lisinopril (PRINIVIL,ZESTRIL) 10 MG tablet Take 10 mg by mouth daily.      Marland Kitchen oxybutynin (DITROPAN-XL) 10 MG 24 hr tablet Take 10 mg by mouth daily.      . Rivaroxaban (XARELTO) 20 MG TABS Take 20 mg by mouth daily.      . tamsulosin (FLOMAX) 0.4 MG CAPS capsule Take 0.4 mg by mouth twice per day  60 capsule  11  . traMADol (ULTRAM) 50 MG tablet TAKE 1 TABLET EVERY 6 HOURS AS NEEDED FOR PAIN  120 tablet  2  .  pantoprazole (PROTONIX) 40 MG tablet Take 1 tablet (40 mg total) by mouth daily.  90 tablet  3   No facility-administered medications prior to visit.   No Known Allergies Patient Active Problem List   Diagnosis Date Noted  . Abdominal  pain, other specified site 11/14/2013  . Near syncope 05/06/2013  . abnormal (EKG) 05/06/2013  . Dry mouth 03/02/2013  . Coronary artery calcification seen on CAT scan 12/26/2012  . Lumbar back pain 12/26/2012  . Varicose veins of lower extremities with other complications 07/13/2012  . Leg pain, right 04/17/2012  . Leg swelling 04/17/2012  . DOE (dyspnea on exertion) 02/26/2012  .  Preventative health care 12/24/2011  . Cervical radiculopathy 12/24/2011  . 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 Shortness of breath 12/18/2010  . SYNCOPE 12/04/2010  . FREQUENCY, URINARY 11/24/2010  . NECK PAIN, RIGHT 08/21/2010  . Atrial fibrillation 05/29/2010  . Sick sinus syndrome 05/20/2010  . NAUSEA AND VOMITING 05/20/2010  . Rhabdomyolysis 03/25/2010  . SHOULDER PAIN, RIGHT 12/09/2009  . ACHILLES TENDINITIS 12/09/2009  . BENIGN PROSTATIC HYPERTROPHY 06/26/2009  . WEIGHT LOSS-ABNORMAL 06/05/2009  . NAUSEA 06/05/2009  . PERSONAL HX COLONIC POLYPS 06/05/2009  . BRADYCARDIA 05/27/2009  . FATIGUE 05/27/2009  . HYPERLIPIDEMIA 05/30/2008  . DEPRESSION 05/30/2008  . HYPERTENSION 05/30/2008  . ALLERGIC RHINITIS 05/30/2008  . GERD 05/30/2008  . Diverticulosis of colon (without mention of hemorrhage) 05/30/2008  . OVERACTIVE BLADDER 05/30/2008  . LOW BACK PAIN 05/30/2008   History  Substance Use Topics  . Smoking status: Former Smoker -- 0.50 packs/day for 25 years    Types: Cigarettes    Quit date: 11/16/1978  . Smokeless tobacco: Former User    Types: Chew  . Alcohol Use: No      family history includes Arthritis in his paternal grandmother; Asthma in his sister; Cancer in his father; Diabetes in his mother; Hypertension in his brother, daughter, father, mother, sister, and son; Lung cancer in his father.  Objective:   Physical Exam   Well-developed older African American male in no acute distress, accompanied by his wife blood pressure 110/70 pulse 64 height 5 foot 9 weight 192. HEENT; nontraumatic normocephalic EOMI PERRLA ;sclera anicteric, Supple; no JVD, Cardiovascular ;regular rate and rhythm with S1-S2 does have a soft murmur, pacemaker and left chest wall, Pulmonary; clear bilaterally, Abdomen ;soft bowel sounds are present no audible bruit no palpable mass or hepatosplenomegaly no real abdominal tenderness, Rectal exam not done, Extremities; no clubbing cyanosis or  edema skin warm and dry, Psych; mood and affect normal and appropriate       Assessment & Plan:  #1  72-year-old male with upper abdominal discomfort," rolling", dyspepsia ,anorexia and weight loss X2 months. Etiology is not clear. He may have an NSAID-induced ulcer, gastritis but also need to rule out occult malignancy. Is #2 atrial fibrillation on anticoagulation with Xarelto #3 sick sinus syndrome status post pacemaker placement #4 hypertension #5 hyperlipidemia #6 cervical disc disease #7 diverticulosis #8 remote history of pancreatitis  Plan; patient will stay off of NSAIDs Increase Protonix to 40 mg by mouth twice daily Encouraged small frequent feedings ,bland foods Schedule for CT scan of the abdomen and pelvis with contrast Schedule for upper endoscopy with Dr. Brodie procedure discussed in detail with the patient and he is agreeable to proceed. Will need to hold Xarelto  For at least 24 hours prior to the procedure and we'll obtain consent from Dr. Allred his cardiologist- 

## 2013-12-04 NOTE — Interval H&P Note (Signed)
History and Physical Interval Note:  12/04/2013 9:38 PM  [name]  has presented today for surgery, with the diagnosis of anorexia Weight loss Upper abdominal pain  The various methods of treatment have been discussed with the patient and family. After consideration of risks, benefits and other options for treatment, the patient has consented to  Procedure(s): ESOPHAGOGASTRODUODENOSCOPY (EGD) (N/A) as a surgical intervention .  The patient's history has been reviewed, patient examined, no change in status, stable for surgery.  I have reviewed the patient's chart and labs.  Questions were answered to the patient's satisfaction.     Delfin Edis

## 2013-12-05 ENCOUNTER — Encounter (HOSPITAL_COMMUNITY)
Admission: RE | Disposition: A | Discharge status: Home / Self Care | Payer: Self-pay | Source: Ambulatory Visit | Attending: Internal Medicine

## 2013-12-05 ENCOUNTER — Encounter (HOSPITAL_COMMUNITY): Payer: Medicare Other | Admitting: Anesthesiology

## 2013-12-05 ENCOUNTER — Ambulatory Visit (HOSPITAL_COMMUNITY): Payer: Medicare Other | Admitting: Anesthesiology

## 2013-12-05 ENCOUNTER — Ambulatory Visit (HOSPITAL_COMMUNITY)
Admission: RE | Admit: 2013-12-05 | Discharge: 2013-12-05 | Disposition: A | Discharge status: Home / Self Care | Payer: Medicare Other | Source: Ambulatory Visit | Attending: Internal Medicine | Admitting: Internal Medicine

## 2013-12-05 ENCOUNTER — Encounter (HOSPITAL_COMMUNITY): Payer: Self-pay | Admitting: *Deleted

## 2013-12-05 DIAGNOSIS — Z7901 Long term (current) use of anticoagulants: Secondary | ICD-10-CM | POA: Diagnosis not present

## 2013-12-05 DIAGNOSIS — I495 Sick sinus syndrome: Secondary | ICD-10-CM | POA: Diagnosis not present

## 2013-12-05 DIAGNOSIS — I10 Essential (primary) hypertension: Secondary | ICD-10-CM | POA: Insufficient documentation

## 2013-12-05 DIAGNOSIS — Z79899 Other long term (current) drug therapy: Secondary | ICD-10-CM | POA: Insufficient documentation

## 2013-12-05 DIAGNOSIS — K219 Gastro-esophageal reflux disease without esophagitis: Secondary | ICD-10-CM

## 2013-12-05 DIAGNOSIS — K5730 Diverticulosis of large intestine without perforation or abscess without bleeding: Secondary | ICD-10-CM | POA: Diagnosis not present

## 2013-12-05 DIAGNOSIS — K2970 Gastritis, unspecified, without bleeding: Secondary | ICD-10-CM | POA: Diagnosis not present

## 2013-12-05 DIAGNOSIS — I4891 Unspecified atrial fibrillation: Secondary | ICD-10-CM | POA: Insufficient documentation

## 2013-12-05 DIAGNOSIS — R110 Nausea: Secondary | ICD-10-CM

## 2013-12-05 DIAGNOSIS — E785 Hyperlipidemia, unspecified: Secondary | ICD-10-CM | POA: Diagnosis not present

## 2013-12-05 DIAGNOSIS — R109 Unspecified abdominal pain: Secondary | ICD-10-CM | POA: Diagnosis not present

## 2013-12-05 DIAGNOSIS — R630 Anorexia: Secondary | ICD-10-CM | POA: Diagnosis not present

## 2013-12-05 DIAGNOSIS — K2940 Chronic atrophic gastritis without bleeding: Secondary | ICD-10-CM | POA: Diagnosis not present

## 2013-12-05 DIAGNOSIS — R1013 Epigastric pain: Secondary | ICD-10-CM | POA: Diagnosis not present

## 2013-12-05 DIAGNOSIS — Z87891 Personal history of nicotine dependence: Secondary | ICD-10-CM | POA: Insufficient documentation

## 2013-12-05 DIAGNOSIS — M5090 Cervical disc disorder, unspecified, unspecified cervical region: Secondary | ICD-10-CM | POA: Diagnosis not present

## 2013-12-05 DIAGNOSIS — Z950 Presence of cardiac pacemaker: Secondary | ICD-10-CM | POA: Diagnosis not present

## 2013-12-05 DIAGNOSIS — R634 Abnormal weight loss: Secondary | ICD-10-CM | POA: Insufficient documentation

## 2013-12-05 DIAGNOSIS — K2990 Gastroduodenitis, unspecified, without bleeding: Secondary | ICD-10-CM | POA: Diagnosis not present

## 2013-12-05 HISTORY — PX: ESOPHAGOGASTRODUODENOSCOPY: SHX5428

## 2013-12-05 HISTORY — DX: Presence of cardiac pacemaker: Z95.0

## 2013-12-05 SURGERY — EGD (ESOPHAGOGASTRODUODENOSCOPY)
Anesthesia: Monitor Anesthesia Care

## 2013-12-05 MED ORDER — PROPOFOL INFUSION 10 MG/ML OPTIME
INTRAVENOUS | Status: DC | PRN
  Administered 2013-12-05: 180 ug/kg/min via INTRAVENOUS

## 2013-12-05 MED ORDER — BUTAMBEN-TETRACAINE-BENZOCAINE 2-2-14 % EX AERO
INHALATION_SPRAY | CUTANEOUS | Status: DC | PRN
  Administered 2013-12-05: 1 via TOPICAL

## 2013-12-05 MED ORDER — PROPOFOL 10 MG/ML IV BOLUS
INTRAVENOUS | Status: AC
  Filled 2013-12-05: qty 40

## 2013-12-05 MED ORDER — LACTATED RINGERS IV SOLN
INTRAVENOUS | Status: DC | PRN
  Administered 2013-12-05: 14:00:00 via INTRAVENOUS

## 2013-12-05 MED ORDER — MIDAZOLAM HCL 2 MG/2ML IJ SOLN
INTRAMUSCULAR | Status: AC
  Filled 2013-12-05: qty 2

## 2013-12-05 MED ORDER — MIDAZOLAM HCL 5 MG/5ML IJ SOLN
INTRAMUSCULAR | Status: DC | PRN
  Administered 2013-12-05: 2 mg via INTRAVENOUS

## 2013-12-05 MED ORDER — PROPOFOL 10 MG/ML IV BOLUS
INTRAVENOUS | Status: DC | PRN
  Administered 2013-12-05: 40 mg via INTRAVENOUS

## 2013-12-05 NOTE — Anesthesia Preprocedure Evaluation (Addendum)
Anesthesia Evaluation  Patient identified by MRN, date of birth, ID band Patient awake    Reviewed: Allergy & Precautions, H&P , NPO status , Patient's Chart, lab work & pertinent test results  Airway Mallampati: II TM Distance: >3 FB Neck ROM: Full    Dental no notable dental hx.    Pulmonary shortness of breath, former smoker,  breath sounds clear to auscultation  Pulmonary exam normal       Cardiovascular hypertension, + dysrhythmias Atrial Fibrillation + pacemaker Rhythm:Irregular Rate:Normal  Pacemaker check in clinic. Normal device function. Thresholds, sensing, impedances consistent with previous measurements. Device programmed to maximize longevity. No mode switch or high ventricular rates noted. Frequent PAC's, + xarelto.  Device  programmed at appropriate safety margins. Histogram distribution appropriate for patient activity level. Device programmed to optimize intrinsic conduction. Estimated longevity 5 years. Patient education completed.  ROV 6 months with Dr. Rayann Heman.   Neuro/Psych negative neurological ROS  negative psych ROS   GI/Hepatic negative GI ROS, Neg liver ROS,   Endo/Other  negative endocrine ROS  Renal/GU negative Renal ROS  negative genitourinary   Musculoskeletal negative musculoskeletal ROS (+)   Abdominal   Peds negative pediatric ROS (+)  Hematology negative hematology ROS (+)   Anesthesia Other Findings   Reproductive/Obstetrics negative OB ROS                          Anesthesia Physical Anesthesia Plan  ASA: III  Anesthesia Plan: MAC   Post-op Pain Management:    Induction: Intravenous  Airway Management Planned: Nasal Cannula  Additional Equipment:   Intra-op Plan:   Post-operative Plan:   Informed Consent: I have reviewed the patients History and Physical, chart, labs and discussed the procedure including the risks, benefits and alternatives for the  proposed anesthesia with the patient or authorized representative who has indicated his/her understanding and acceptance.   Dental advisory given  Plan Discussed with: CRNA and Surgeon  Anesthesia Plan Comments:         Anesthesia Quick Evaluation

## 2013-12-05 NOTE — Interval H&P Note (Signed)
History and Physical Interval Note:  12/05/2013 12:47 PM  [name]  has presented today for surgery, with the diagnosis of anorexia Weight loss Upper abdominal pain  The various methods of treatment have been discussed with the patient and family. After consideration of risks, benefits and other options for treatment, the patient has consented to  Procedure(s): ESOPHAGOGASTRODUODENOSCOPY (EGD) (N/A) as a surgical intervention .  The patient's history has been reviewed, patient examined, no change in status, stable for surgery.  I have reviewed the patient's chart and labs.  Questions were answered to the patient's satisfaction.     Delfin Edis

## 2013-12-05 NOTE — Transfer of Care (Signed)
Immediate Anesthesia Transfer of Care Note  Patient: [name]  Procedure(s) Performed: Procedure(s): ESOPHAGOGASTRODUODENOSCOPY (EGD) (N/A)  Patient Location: PACU and Endoscopy Unit  Anesthesia Type:MAC  Level of Consciousness: awake and alert   Airway & Oxygen Therapy: Patient Spontanous Breathing and Patient connected to nasal cannula oxygen  Post-op Assessment: Report given to PACU RN and Post -op Vital signs reviewed and stable  Post vital signs: Reviewed and stable  Complications: No apparent anesthesia complications

## 2013-12-05 NOTE — Discharge Instructions (Signed)
Conscious Sedation, Adult, Care After °Refer to this sheet in the next few weeks. These instructions provide you with information on caring for yourself after your procedure. Your health care provider may also give you more specific instructions. Your treatment has been planned according to current medical practices, but problems sometimes occur. Call your health care provider if you have any problems or questions after your procedure. °WHAT TO EXPECT AFTER THE PROCEDURE  °After your procedure: °· You may feel sleepy, clumsy, and have poor balance for several hours. °· Vomiting may occur if you eat too soon after the procedure. °HOME CARE INSTRUCTIONS °· Do not participate in any activities where you could become injured for at least 24 hours. Do not: °· Drive. °· Swim. °· Ride a bicycle. °· Operate heavy machinery. °· Cook. °· Use power tools. °· Climb ladders. °· Work from a high place. °· Do not make important decisions or sign legal documents until you are improved. °· If you vomit, drink water, juice, or soup when you can drink without vomiting. Make sure you have little or no nausea before eating solid foods. °· Only take over-the-counter or prescription medicines for pain, discomfort, or fever as directed by your health care provider. °· Make sure you and your family fully understand everything about the medicines given to you, including what side effects may occur. °· You should not drink alcohol, take sleeping pills, or take medicines that cause drowsiness for at least 24 hours. °· If you smoke, do not smoke without supervision. °· If you are feeling better, you may resume normal activities 24 hours after you were sedated. °· Keep all appointments with your health care provider. °SEEK MEDICAL CARE IF: °· Your skin is pale or bluish in color. °· You continue to feel nauseous or vomit. °· Your pain is getting worse and is not helped by medicine. °· You have bleeding or swelling. °· You are still sleepy or  feeling clumsy after 24 hours. °SEEK IMMEDIATE MEDICAL CARE IF: °· You develop a rash. °· You have difficulty breathing. °· You develop any type of allergic problem. °· You have a fever. °MAKE SURE YOU: °· Understand these instructions. °· Will watch your condition. °· Will get help right away if you are not doing well or get worse. °Document Released: 08/23/2013 Document Reviewed: 06/09/2013 °[employer]® Patient Information ©2014 [employer]. ° °

## 2013-12-05 NOTE — Anesthesia Postprocedure Evaluation (Signed)
  Anesthesia Post-op Note  Patient: [name]  Procedure(s) Performed: Procedure(s) (LRB): ESOPHAGOGASTRODUODENOSCOPY (EGD) (N/A)  Patient Location: PACU  Anesthesia Type: MAC  Level of Consciousness: awake and alert   Airway and Oxygen Therapy: Patient Spontanous Breathing  Post-op Pain: mild  Post-op Assessment: Post-op Vital signs reviewed, Patient's Cardiovascular Status Stable, Respiratory Function Stable, Patent Airway and No signs of Nausea or vomiting  Last Vitals:  Filed Vitals:   12/05/13 1416  BP: 123/76  Temp: 36.4 C  Resp: 14    Post-op Vital Signs: stable   Complications: No apparent anesthesia complications

## 2013-12-05 NOTE — Op Note (Signed)
[facility] [place], [address]   ENDOSCOPY PROCEDURE REPORT  PATIENT: [name], [name]  MR#: [id number] BIRTHDATE: 1940-11-25 , 58  yrs. old GENDER: Male ENDOSCOPIST: [name], MD REFERRED BY:  Cathlean Cower, M.D. , Dr Clearnce Hasten PROCEDURE DATE:  12/05/2013 PROCEDURE:  EGD w/ biopsy ASA CLASS:     Class III INDICATIONS:  Epigastric pain.   Weight loss.   CT scan of the abdomen- no active process,,symptoms improved since Protonix started. MEDICATIONS: MAC sedation, administered by CRNA TOPICAL ANESTHETIC: Cetacaine Spray  DESCRIPTION OF PROCEDURE: After the risks benefits and alternatives of the procedure were thoroughly explained, informed consent was obtained.  The Pentax Gastroscope [id number] endoscope was introduced through the mouth and advanced to the second portion of the duodenum. Without limitations.  The instrument was slowly withdrawn as the mucosa was fully examined.        ESOPHAGUS: The mucosa of the esophagus appeared normal.  STOMACH: Mild gastritis (inflammation) was found in the gastric antrum.  DUODENUM: The duodenal mucosa showed no abnormalities in the bulb and second portion of the duodenum.  Retroflexed views revealed no abnormalities.     The scope was then withdrawn from the patient and the procedure completed.  COMPLICATIONS: There were no complications. ENDOSCOPIC IMPRESSION: 1.   The mucosa of the esophagus appeared normal 2.   Gastritis (inflammation) was found in the gastric antrum s/p biopsies to r/o H.Pylori 3.   The duodenal mucosa showed no abnormalities in the bulb and second portion of the duodenum  RECOMMENDATIONS: 1.  Await biopsy results 2.  Continue PPI  REPEAT EXAM:no  eSigned:  [name], MD 12/05/2013 2:19 PM   CC:  PATIENT NAME:  [name], [name] MR#: [id number]

## 2013-12-06 ENCOUNTER — Encounter (HOSPITAL_COMMUNITY): Payer: Self-pay | Admitting: Internal Medicine

## 2013-12-06 ENCOUNTER — Encounter: Payer: Self-pay | Admitting: Internal Medicine

## 2013-12-08 ENCOUNTER — Telehealth: Payer: Self-pay | Admitting: Internal Medicine

## 2013-12-08 DIAGNOSIS — M79646 Pain in unspecified finger(s): Secondary | ICD-10-CM

## 2013-12-08 DIAGNOSIS — M25579 Pain in unspecified ankle and joints of unspecified foot: Secondary | ICD-10-CM

## 2013-12-08 NOTE — Telephone Encounter (Signed)
[name] for referral to Dr Tamala Julian - done per [facility]

## 2013-12-08 NOTE — Telephone Encounter (Signed)
Patient informed. 

## 2013-12-08 NOTE — Telephone Encounter (Signed)
Pt request referral for arthritis doctor due to pain in ankle and fingers. Please advise.

## 2013-12-11 ENCOUNTER — Telehealth: Payer: Self-pay | Admitting: Physician Assistant

## 2013-12-11 NOTE — Telephone Encounter (Signed)
Please increase to 20mg  po bid, if still not effective, we will change the strength to 40mg ., but needs to call to let us know.

## 2013-12-11 NOTE — Telephone Encounter (Signed)
Patient notified of Dr. Brodie's recommendations. 

## 2013-12-11 NOTE — Telephone Encounter (Signed)
Patient states he is still having reflux. States he is taking Omeprazole 20 mg daily. He will try increasing this to BID. Please, advise.

## 2013-12-12 DIAGNOSIS — L259 Unspecified contact dermatitis, unspecified cause: Secondary | ICD-10-CM | POA: Diagnosis not present

## 2013-12-14 ENCOUNTER — Encounter: Payer: Self-pay | Admitting: Internal Medicine

## 2013-12-15 ENCOUNTER — Other Ambulatory Visit (INDEPENDENT_AMBULATORY_CARE_PROVIDER_SITE_OTHER): Payer: Medicare Other

## 2013-12-15 ENCOUNTER — Encounter: Payer: Self-pay | Admitting: Family Medicine

## 2013-12-15 ENCOUNTER — Ambulatory Visit (INDEPENDENT_AMBULATORY_CARE_PROVIDER_SITE_OTHER): Payer: Medicare Other | Admitting: Family Medicine

## 2013-12-15 VITALS — BP 124/70 | HR 81 | Temp 97.5°F | Resp 16 | Wt 195.4 lb

## 2013-12-15 DIAGNOSIS — M19079 Primary osteoarthritis, unspecified ankle and foot: Secondary | ICD-10-CM | POA: Diagnosis not present

## 2013-12-15 DIAGNOSIS — M79609 Pain in unspecified limb: Secondary | ICD-10-CM

## 2013-12-15 DIAGNOSIS — M7750 Other enthesopathy of unspecified foot: Secondary | ICD-10-CM

## 2013-12-15 DIAGNOSIS — M79671 Pain in right foot: Secondary | ICD-10-CM

## 2013-12-15 MED ORDER — LISINOPRIL 10 MG PO TABS: 10.0000 mg | ORAL_TABLET | Freq: Every morning | ORAL | Status: DC

## 2013-12-15 NOTE — Progress Notes (Signed)
Pre-visit discussion using our clinic review tool. No additional management support is needed unless otherwise documented below in the visit note.  

## 2013-12-15 NOTE — Patient Instructions (Signed)
Very nice to meet you For your achilles pain try the exercsies 3 times a week and ice bath 10 minutes daily.  Wear the heel lifts in your shoe For your arthritis.  Take tylenol 650 mg three times a day is the best evidence based medicine we have for arthritis.  Glucosamine sulfate 750mg  twice a day is a supplement that has been shown to help moderate to severe arthritis. Vitamin D 2000 IU daily Fish oil 2 grams daily.  Tumeric 500mg  twice daily.  Capsaicin topically up to four times a day may also help with pain. Cortisone injections are an option if these interventions do not seem to make a difference or need more relief.  It's important that you continue to stay active. Consider physical therapy to strengthen muscles around the joint that hurts to take pressure off of the joint itself. Shoe inserts with good arch support may be helpful.  S[employer]co orthotics at [employer] could help.  Wear shoes with good cushioning but rigid sole.  Tennis shoes or Bronwen Betters or Dansko are good.  Water aerobics and cycling with low resistance are the best two types of exercise for arthritis. Come back and see me in 3-4 weeks.  If still in pain we can do custom orthotics.

## 2013-12-16 ENCOUNTER — Encounter: Payer: Self-pay | Admitting: Family Medicine

## 2013-12-16 DIAGNOSIS — M19079 Primary osteoarthritis, unspecified ankle and foot: Secondary | ICD-10-CM | POA: Insufficient documentation

## 2013-12-16 DIAGNOSIS — M7750 Other enthesopathy of unspecified foot: Secondary | ICD-10-CM | POA: Insufficient documentation

## 2013-12-16 NOTE — Assessment & Plan Note (Signed)
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 With patient's health problems especially cardiovascular I would like to stick with mostly natural medications he'll not have any effect with his current treatments. Patient will try over-the-counter medications as described in his patient instructions. In addition to this we discussed shoe choices as well as over-the-counter orthotics they can be beneficial. Patient will try these interventions and come back again. If he continues to have pain we can consider x-rays as well as steroid injections into the joint and also consider custom orthotics if necessary. Patient will return again in 4 weeks for further evaluation.

## 2013-12-16 NOTE — Assessment & Plan Note (Signed)
Patient was given heel lifts at this time. Patient will try and natural medication such as tumeric which could help with swelling but will avoid anti-inflammatories secondary to his hypertension as well as cardiovascular risk. Patient will try ice bath. If this continues to give him trouble we can consider steroid injection under ultrasound guidance.

## 2013-12-16 NOTE — Progress Notes (Signed)
I'm seeing this patient by the request  of:  Cathlean Cower, MD   CC: Bilateral ankle pain  HPI: Patient is a very pleasant 73 year old gentleman with significant past medical history significant for persistent atrial fibrillation status post pacemaker coming in with bilateral ankle pain. Patient states he actually has pain in multiple joints but his ankles and to be worse. Patient states the right is greater than left. Patient states it hurts more with any type of ambulation for a long period of time. Patient also notices it more when he is doing yard work. Patient has been taking tramadol with mild improvement. Denies any radiation or numbness. Patient describes it is more of a dull aching pain can be bad at night as well. Patient feels that his bones are grinding. The patient rates the severity of 8/10.  Past medical, surgical, family and social history reviewed. Medications reviewed all in the electronic medical record.   Review of Systems: No headache, visual changes, nausea, vomiting, diarrhea, constipation, dizziness, abdominal pain, skin rash, fevers, chills, night sweats, weight loss, swollen lymph nodes, body aches, joint swelling, muscle aches, chest pain, shortness of breath, mood changes.   Objective:    Blood pressure 124/70, pulse 81, temperature 97.5 F (36.4 C), temperature source Oral, resp. rate 16, weight 195 lb 6.4 oz (88.633 kg), SpO2 95.00%.   General: No apparent distress alert and oriented x3 mood and affect normal, dressed appropriately.  HEENT: Pupils equal, extraocular movements intact Respiratory: Patient's speak in full sentences and does not appear short of breath Cardiovascular: No lower extremity edema, non tender, no erythema Skin: Warm dry intact with no signs of infection or rash on extremities or on axial skeleton. Abdomen: Soft nontender Neuro: Cranial nerves II through XII are intact, neurovascularly intact in all extremities with 2+ DTRs and 2+  pulses. Lymph: No lymphadenopathy of posterior or anterior cervical chain or axillae bilaterally.  Gait since gait shows that he does walk with a midfoot strike and does not push off likely secondary to arthritis in his ankles. MSK: Non tender with full range of motion and good stability and symmetric strength and tone of shoulders, elbows, wrist, hip, knee bilaterally. Significant osteoarthritic changes of multiple joints. Ankle: Bilateral No visible erythema or swelling. Range of motion is restricted in flexion and extension. Patient also has almost complete loss of inversion and eversion. Strength is 5/5 in all directions. Stable lateral and medial ligaments; squeeze test and kleiger test unremarkable; Talar dome tender to palpation No pain at base of 5th MT; No tenderness over cuboid; No tenderness over N spot or navicular prominence No tenderness on posterior aspects of lateral and medial malleolus No sign of peroneal tendon subluxations or tenderness to palpation Negative tarsal tunnel tinel's Able to walk 4 steps.  MSK US performed of: Right This study was ordered, performed, and interpreted by Charlann Boxer D.O.  Foot/Ankle:   All structures visualized.   Talar dome show significant osteoarthritic changes with anterior spurring Ankle mortise without effusion. Peroneus longus and brevis tendons unremarkable on long and transverse views without sheath effusions. Posterior tibialis, flexor hallucis longus, and flexor digitorum longus tendons unremarkable on long and transverse views without sheath effusions. Achilles tendon visualized along length of tendon and unremarkable on long and transverse views without sheath effusion. Patient though does have a retrocalcaneal bursitis Anterior Talofibular Ligament and Calcaneofibular Ligaments unremarkable and intact. Deltoid Ligament unremarkable and intact. Plantar fascia intact and without effusion, normal thickness. No increased doppler  signal,  cap sign, or thickening of tibial cortex. Power doppler signal normal.  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    Impression and Recommendations:     This case required medical decision making of moderate complexity.

## 2013-12-26 ENCOUNTER — Encounter: Payer: Self-pay | Admitting: Internal Medicine

## 2013-12-26 ENCOUNTER — Other Ambulatory Visit (INDEPENDENT_AMBULATORY_CARE_PROVIDER_SITE_OTHER): Payer: Medicare Other

## 2013-12-26 ENCOUNTER — Ambulatory Visit (INDEPENDENT_AMBULATORY_CARE_PROVIDER_SITE_OTHER): Payer: Medicare Other | Admitting: Internal Medicine

## 2013-12-26 VITALS — BP 122/78 | HR 69 | Temp 98.0°F | Ht 69.0 in | Wt 195.4 lb

## 2013-12-26 DIAGNOSIS — I10 Essential (primary) hypertension: Secondary | ICD-10-CM

## 2013-12-26 DIAGNOSIS — N320 Bladder-neck obstruction: Secondary | ICD-10-CM

## 2013-12-26 DIAGNOSIS — R220 Localized swelling, mass and lump, head: Secondary | ICD-10-CM | POA: Diagnosis not present

## 2013-12-26 DIAGNOSIS — E785 Hyperlipidemia, unspecified: Secondary | ICD-10-CM

## 2013-12-26 DIAGNOSIS — R221 Localized swelling, mass and lump, neck: Secondary | ICD-10-CM

## 2013-12-26 LAB — CBC WITH DIFFERENTIAL/PLATELET
Basophils Absolute: 0 10*3/uL (ref 0.0–0.1)
Basophils Relative: 0.4 % (ref 0.0–3.0)
Eosinophils Absolute: 0 10*3/uL (ref 0.0–0.7)
Eosinophils Relative: 0.6 % (ref 0.0–5.0)
HCT: 47.3 % (ref 39.0–52.0)
Hemoglobin: 15.5 g/dL (ref 13.0–17.0)
Lymphocytes Relative: 26.6 % (ref 12.0–46.0)
Lymphs Abs: 1.7 10*3/uL (ref 0.7–4.0)
MCHC: 32.8 g/dL (ref 30.0–36.0)
MCV: 90.8 fl (ref 78.0–100.0)
Monocytes Absolute: 0.7 10*3/uL (ref 0.1–1.0)
Monocytes Relative: 10.9 % (ref 3.0–12.0)
Neutro Abs: 4 10*3/uL (ref 1.4–7.7)
Neutrophils Relative %: 61.5 % (ref 43.0–77.0)
Platelets: 223 10*3/uL (ref 150.0–400.0)
RBC: 5.21 Mil/uL (ref 4.22–5.81)
RDW: 14.5 % (ref 11.5–14.6)
WBC: 6.5 10*3/uL (ref 4.5–10.5)

## 2013-12-26 LAB — BASIC METABOLIC PANEL
BUN: 12 mg/dL (ref 6–23)
CHLORIDE: 104 meq/L (ref 96–112)
CO2: 28 meq/L (ref 19–32)
Calcium: 9.3 mg/dL (ref 8.4–10.5)
Creatinine, Ser: 0.9 mg/dL (ref 0.4–1.5)
GFR: 111.34 mL/min (ref >60.00)
Glucose, Bld: 106 mg/dL — ABNORMAL HIGH (ref 70–99)
POTASSIUM: 4 meq/L (ref 3.5–5.1)
SODIUM: 139 meq/L (ref 135–145)

## 2013-12-26 LAB — LIPID PANEL
Cholesterol: 265 mg/dL — ABNORMAL HIGH (ref 0–200)
HDL: 66.9 mg/dL (ref >39.00)
Total CHOL/HDL Ratio: 4
Triglycerides: 52 mg/dL (ref 0.0–149.0)
VLDL: 10.4 mg/dL (ref 0.0–40.0)

## 2013-12-26 LAB — URINALYSIS, ROUTINE W REFLEX MICROSCOPIC
Bilirubin Urine: NEGATIVE
Hgb urine dipstick: NEGATIVE
Ketones, ur: NEGATIVE
Leukocytes, UA: NEGATIVE
NITRITE: NEGATIVE
PH: 6 (ref 5.0–8.0)
Specific Gravity, Urine: >=1.03 — AB (ref 1.000–1.030)
TOTAL PROTEIN, URINE-UPE24: NEGATIVE
Urine Glucose: NEGATIVE
Urobilinogen, UA: 0.2 (ref 0.0–1.0)
WBC, UA: NONE SEEN — AB (ref 0–?)

## 2013-12-26 LAB — HEPATIC FUNCTION PANEL
ALT: 23 U/L (ref 0–53)
AST: 22 U/L (ref 0–37)
Albumin: 4.2 g/dL (ref 3.5–5.2)
Alkaline Phosphatase: 68 U/L (ref 39–117)
Bilirubin, Direct: 0.1 mg/dL (ref 0.0–0.3)
Total Bilirubin: 0.6 mg/dL (ref 0.3–1.2)
Total Protein: 7.6 g/dL (ref 6.0–8.3)

## 2013-12-26 LAB — SEDIMENTATION RATE: SED RATE: 10 mm/h (ref 0–22)

## 2013-12-26 LAB — LDL CHOLESTEROL, DIRECT: LDL DIRECT: 183.4 mg/dL

## 2013-12-26 LAB — TSH: TSH: 1.21 u[IU]/mL (ref 0.35–5.50)

## 2013-12-26 LAB — PSA: PSA: 1.97 ng/mL (ref 0.10–4.00)

## 2013-12-26 MED ORDER — TAMSULOSIN HCL 0.4 MG PO CAPS: ORAL_CAPSULE | ORAL | Status: DC

## 2013-12-26 NOTE — Assessment & Plan Note (Addendum)
?   Incidental finding, possibly benign LA for inflammatory or recent post infectious, but cant r/o other such as malignancy, for neck u/s to start, if any supicious mass will need neck ct and/or ent eval; not recent cxr neg dec 2014  Note:  Total time for pt hx, exam, review of record with pt in the room, determination of diagnoses and plan for further eval and tx is > 40 min, with over 50% spent in coordination and counseling of patient

## 2013-12-26 NOTE — Progress Notes (Signed)
Pre-visit discussion using our clinic review tool. No additional management support is needed unless otherwise documented below in the visit note.  

## 2013-12-26 NOTE — Assessment & Plan Note (Signed)
stable overall by history and exam, recent data reviewed with pt, and pt to continue medical treatment as before,  to f/u any worsening symptoms or concerns Lab Results  Component Value Date   LDLCALC 83 12/26/2012

## 2013-12-26 NOTE — Patient Instructions (Signed)
Please continue all other medications as before, and refills have been done if requested. Please have the pharmacy call with any other refills you may need. Please continue your efforts at being more active, low cholesterol diet, and weight control. You are otherwise up to date with prevention measures today.  You will be contacted regarding the referral for: neck ultrasound  Please go to the LAB in the Basement (turn left off the elevator) for the tests to be done today You will be contacted by phone if any changes need to be made immediately.  Otherwise, you will receive a letter about your results with an explanation, but please check with MyChart first.  Please return in 6 months, or sooner if needed

## 2013-12-26 NOTE — Addendum Note (Signed)
Addended by: Sharon Seller B on: 12/26/2013 09:19 AM   Modules accepted: Orders

## 2013-12-26 NOTE — Assessment & Plan Note (Signed)
Also for psa as he is due 

## 2013-12-26 NOTE — Assessment & Plan Note (Signed)
stable overall by history and exam, recent data reviewed with pt, and pt to continue medical treatment as before,  to f/u any worsening symptoms or concerns BP Readings from Last 3 Encounters:  12/26/13 122/78  12/15/13 124/70  12/05/13 129/89

## 2013-12-26 NOTE — Progress Notes (Signed)
Subjective:    Patient ID: [name], male    DOB: 1941/01/08, 73 y.o.   MRN: [id number]  HPI  Here for yearly f/u;  Overall doing ok;  Pt denies CP, worsening SOB, DOE, wheezing, orthopnea, PND, worsening LE edema, palpitations, dizziness or syncope.  Pt denies neurological change such as new headache, facial or extremity weakness.  Pt denies polydipsia, polyuria, or low sugar symptoms. Pt states overall good compliance with treatment and medications, good tolerability, and has been trying to follow lower cholesterol diet.  Pt denies worsening depressive symptoms, suicidal ideation or panic. No fever, night sweats, wt loss, loss of appetite, or other constitutional symptoms.  Pt states good ability with ADL's, has low fall risk, home safety reviewed and adequate, no other significant changes in hearing or vision, and only occasionally active with exercise.  Has recently seen ER dec 30 , then Dr Olevia Perches with EGD with gastrigtis, with PPI incraeased to bid, and also Dr Smith/sport med with right foot DJD and bursitis.   Taking tylenol tid, not clear to him if better yet, has yet to get the orthotics recommended as well. Saw and ad on TV for [place] cream, tried that and seemed to help foot, though not sure if helped his shoulders when tried.  Not taking alleve or advil further since his last visit to me, on Xarelto.    He has not been aware of any fulness or pain to the left neck, but mentions a vague sense of ant neck pain he attributes to the back of the throat, neg exam by endo recent and not better with BID PPI. No fever, cough, wt loss, appetitie loss Past Medical History  Diagnosis Date  . Hypertension   . Hyperlipidemia   . BPH (benign prostatic hyperplasia)   . Diverticulosis of colon (without mention of hemorrhage)   . Esophageal reflux   . Abdominal pain, epigastric   . Depressive disorder, not elsewhere classified   . Lumbago   . Allergic rhinitis, cause unspecified   . Sick sinus  syndrome July 2010    s/p [employer]) by [employer]  . Persistent atrial fibrillation   . Chronic anticoagulation     xarelto  . High risk medication use   . Paralyzed hemidiaphragm     LEFT  . Coronary artery calcification seen on CAT scan 12/26/2012  . Arthritis   . Shortness of breath     sob with exertion  . Pacemaker    Past Surgical History  Procedure Laterality Date  . Insert / replace / remove pacemaker  05/2009     [facility] scientific ALTRUA 60  (serial number [id number]) dual-chamber pacemaker  . Pacemaker placement Bilateral     '05  . Esophagogastroduodenoscopy N/A 12/05/2013    Procedure: ESOPHAGOGASTRODUODENOSCOPY (EGD);  Surgeon: [name], MD;  Location: [facility];  Service: Endoscopy;  Laterality: N/A;    reports that he quit smoking about 35 years ago. His smoking use included Cigarettes. He has a 12.5 pack-year smoking history. He has quit using smokeless tobacco. His smokeless tobacco use included Chew. He reports that he does not drink alcohol or use illicit drugs. family history includes Arthritis in his paternal grandmother; Asthma in his sister; Cancer in his father; Diabetes in his mother; Hypertension in his brother, daughter, father, mother, sister, and son; Lung cancer in his father. Allergies  Allergen Reactions  . Pantoprazole Sodium Itching   Current Outpatient Prescriptions on File Prior to Visit  Medication Sig Dispense Refill  . amLODipine (NORVASC) 10 MG tablet Take 10 mg by mouth every morning.       . fexofenadine (ALLEGRA) 180 MG tablet Take 180 mg by mouth daily.      . flecainide (TAMBOCOR) 100 MG tablet Take 100 mg by mouth 2 (two) times daily.      . fluticasone (FLONASE) 50 MCG/ACT nasal spray Place 2 sprays into the nose daily.  16 g  5  . latanoprost (XALATAN) 0.005 % ophthalmic solution Place 1 drop into both eyes daily.      Marland Kitchen lisinopril (PRINIVIL,ZESTRIL) 10 MG tablet Take 1 tablet (10 mg total) by mouth every morning.  90 tablet   1  . oxybutynin (DITROPAN-XL) 10 MG 24 hr tablet Take 10 mg by mouth daily.      . Rivaroxaban (XARELTO) 20 MG TABS Take 20 mg by mouth daily with supper.       . traMADol (ULTRAM) 50 MG tablet Take 50 mg by mouth every 6 (six) hours as needed for moderate pain.      . [DISCONTINUED] furosemide (LASIX) 40 MG tablet Take 1 tablet (40 mg total) by mouth daily.  30 tablet  11  . [DISCONTINUED] oxybutynin (DITROPAN-XL) 10 MG 24 hr tablet Take 1 tablet (10 mg total) by mouth daily.  90 tablet  3   No current facility-administered medications on file prior to visit.   Review of Systems  Constitutional: Negative for unexpected weight change, or unusual diaphoresis  HENT: Negative for tinnitus.   Eyes: Negative for photophobia and visual disturbance.  Respiratory: Negative for choking and stridor.   Gastrointestinal: Negative for vomiting and blood in stool.  Genitourinary: Negative for hematuria and decreased urine volume.  Musculoskeletal: Negative for acute joint swelling Skin: Negative for color change and wound.  Neurological: Negative for tremors and numbness other than noted  Psychiatric/Behavioral: Negative for decreased concentration or  hyperactivity.       Objective:   Physical Exam BP 122/78  Pulse 69  Temp(Src) 98 F (36.7 C) (Oral)  Ht 5\' 9"  (1.753 m)  Wt 195 lb 6 oz (88.622 kg)  BMI 28.84 kg/m2  SpO2 98% VS noted, not ill appearing Constitutional: Pt appears well-developed and well-nourished. Annabell Sabal HENT: Head: NCAT.  Right Ear: External ear normal.  Left Ear: External ear normal.  Eyes: Conjunctivae and EOM are normal. Pupils are equal, round, and reactive to light.  Neck: Normal range of motion. Neck supple. Has nondiscrete fullness, prob mult LA? Noted pre and post left SCM chains, none on right; seems to extend and may involve left thyroid which is larger than right thyroid, but mild at best Cardiovascular: Normal rate and regular rhythm.   Pulmonary/Chest: Effort  normal and breath sounds normal.  Abd:  Soft, NT, non-distended, + BS, no HSM Neurological: Pt is alert. Not confused  Skin: Skin is warm. No erythema.  Psychiatric: Pt behavior is normal. Thought content normal.     Assessment & Plan:

## 2013-12-28 ENCOUNTER — Other Ambulatory Visit: Payer: Self-pay | Admitting: Internal Medicine

## 2014-01-01 ENCOUNTER — Other Ambulatory Visit: Payer: Self-pay | Admitting: Internal Medicine

## 2014-01-08 ENCOUNTER — Ambulatory Visit
Admission: RE | Admit: 2014-01-08 | Discharge: 2014-01-08 | Disposition: A | Discharge status: Home / Self Care | Payer: Medicare Other | Source: Ambulatory Visit | Attending: Internal Medicine | Admitting: Internal Medicine

## 2014-01-08 DIAGNOSIS — R221 Localized swelling, mass and lump, neck: Secondary | ICD-10-CM

## 2014-01-08 DIAGNOSIS — E041 Nontoxic single thyroid nodule: Secondary | ICD-10-CM | POA: Diagnosis not present

## 2014-01-09 ENCOUNTER — Other Ambulatory Visit: Payer: Self-pay | Admitting: Internal Medicine

## 2014-01-09 DIAGNOSIS — R220 Localized swelling, mass and lump, head: Secondary | ICD-10-CM

## 2014-01-09 DIAGNOSIS — R221 Localized swelling, mass and lump, neck: Principal | ICD-10-CM

## 2014-01-12 ENCOUNTER — Ambulatory Visit: Payer: No Typology Code available for payment source | Admitting: Family Medicine

## 2014-01-12 DIAGNOSIS — Z0289 Encounter for other administrative examinations: Secondary | ICD-10-CM

## 2014-01-16 DIAGNOSIS — K219 Gastro-esophageal reflux disease without esophagitis: Secondary | ICD-10-CM | POA: Diagnosis not present

## 2014-01-16 DIAGNOSIS — D497 Neoplasm of unspecified behavior of endocrine glands and other parts of nervous system: Secondary | ICD-10-CM | POA: Diagnosis not present

## 2014-02-20 ENCOUNTER — Other Ambulatory Visit: Payer: Self-pay | Admitting: Physician Assistant

## 2014-02-21 ENCOUNTER — Telehealth: Payer: Self-pay | Admitting: *Deleted

## 2014-02-21 ENCOUNTER — Other Ambulatory Visit: Payer: Self-pay | Admitting: *Deleted

## 2014-02-21 MED ORDER — OMEPRAZOLE 20 MG PO CPDR: DELAYED_RELEASE_CAPSULE | ORAL | Status: DC

## 2014-02-21 NOTE — Telephone Encounter (Signed)
I called and spoke to [name] to advise he had seen the PA Amy Esterwood and she prescribed Protonix 40 mg.  Just after that on 12-11-2013, Dr. Olevia Perches answered a message from [facility], her nurse that he can use Omeprazole 20 mg twice daily to help with refulx. I cancelled the prescription for the Protonix 40 mg and sent refills for the Omepraole 20mg  twice daily. Sent the prescription to [facility].

## 2014-02-26 ENCOUNTER — Ambulatory Visit: Payer: Medicare Other | Admitting: Emergency Medicine

## 2014-03-22 ENCOUNTER — Encounter: Payer: Self-pay | Admitting: Emergency Medicine

## 2014-03-22 ENCOUNTER — Ambulatory Visit (INDEPENDENT_AMBULATORY_CARE_PROVIDER_SITE_OTHER): Payer: Medicare Other | Admitting: Emergency Medicine

## 2014-03-22 VITALS — BP 118/70 | HR 69 | Ht 69.0 in | Wt 194.2 lb

## 2014-03-22 DIAGNOSIS — R0989 Other specified symptoms and signs involving the circulatory and respiratory systems: Secondary | ICD-10-CM

## 2014-03-22 DIAGNOSIS — R0609 Other forms of dyspnea: Secondary | ICD-10-CM

## 2014-03-22 NOTE — Assessment & Plan Note (Signed)
Due to restriction and L HD paralysis. He has desaturated before with climbing stairs, but he does not want to pursue oxygen qualification at this time. The best thing for him to do at this point is to work on his exercise and conditioning. I have encouraged this. He will follow with me if his breathing changes.

## 2014-03-22 NOTE — Progress Notes (Signed)
Subjective:    Patient ID: [name], male    DOB: 10-16-1941, 73 y.o.   MRN: [id number]  HPI 73 yo former smoker with HTN, hyperlipidemia, s/p pacer for sick sinus syndrome. He has been evaluated by Dr Rayann Heman and Truitt Merle for dyspnea over the last year. The evaluation is most notable for paralyzed L hemidiaphragm (this wasn't present immediately after pacer was placed). Also with reassuring myoview stress test in 10/12, TTE w LVEF 50-55% in 10/12. He underwent PFT today 02/26/12 - spiro most consistent w restriction + possible superimposed obstruction. No BD response. Lung volumes confirm restriction. DLCO decreased but corrects for Va.  His SOB is w exertion, happens with certain activities. He is able to walk at slow pace, able to do some yard work, but trouble climbing stairs. He also gets SOB if he talks to much, bends over. He has experienced some cough, non-productive. He has wheeze with activity. Never tried a SABA to see if he benefits.  He had walking oximetry, said that he did not desat.   ROV 12/08/12 -- follow up for dyspnea. Has hx of SSS and pacer as above. Also with paralyzed R HD and restriction on PFT. Because there was some question of coexisting obstruction, we did a trial of albuterol >> didn't notice any change. His most bothersome complaint is some throat congestion, able to cough mucous out, worse after a meal. He does have nose and sinus congestion. He is on zyrtec, allegra, fluticasone qd - also on an ACE-I.   ROV 03/02/13 -- follows up for restrictive lung dz. Has not responded to BD's in the past. He developed URI sx about 2 wk ago with flu-like sx, body aches, weak and low energy + dyspnea and possibly some wheeze. He was started on Spiriva on 4/1 as a trial - no change in his breathing. Presents today with severe dry mouth. He is on a nasal steroid + allegra. ACE-I was changed to ARB recently. Cough is better.   ROV 03/22/14 -- follows up for restrictive lung dz. Has not  responded to BD's in the past. He desaturated in '13 when walking upstairs. We stopped Spiriva, he has never been on O2. His breathing is unchanged. He still gets SOB w exertion > happens w yard work. He can walk a a slow pace on flat ground.      Objective:   Physical Exam Filed Vitals:   03/22/14 1343  BP: 118/70  Pulse: 69  Height: 5\' 9"  (1.753 m)  Weight: 194 lb 3.2 oz (88.089 kg)  SpO2: 97%   Gen: Pleasant, well-nourished, in no distress,  normal affect  ENT: No lesions,  mouth clear,  oropharynx clear, no postnasal drip  Neck: No JVD, no TMG, no carotid bruits  Lungs: No use of accessory muscles, no dullness to percussion, decreased BS at L base, no wheezes  Cardiovascular: RRR, heart sounds normal, no murmur or gallops, trace edema   Musculoskeletal: No deformities, no cyanosis or clubbing  Neuro: alert, non focal  Skin: Warm, no lesions or rashes      Assessment & Plan:  DOE (dyspnea on exertion) Due to restriction and L HD paralysis. He has desaturated before with climbing stairs, but he does not want to pursue oxygen qualification at this time. The best thing for him to do at this point is to work on his exercise and conditioning. I have encouraged this. He will follow with me if his breathing changes.

## 2014-03-22 NOTE — Patient Instructions (Signed)
We will not start oxygen at this time, but it is a consideration for the future if your breathing worsens.  Please work on your exercise and conditioning - this will probably help your breathing more than any medicine.  Follow with Dr Lamonte Sakai as needed for any changes in your breathing.

## 2014-04-05 ENCOUNTER — Other Ambulatory Visit: Payer: Self-pay | Admitting: Internal Medicine

## 2014-04-05 NOTE — Telephone Encounter (Signed)
Done hardcopy to robin  

## 2014-04-06 NOTE — Telephone Encounter (Signed)
Faxed hardcopy to pharmacy. 

## 2014-04-13 ENCOUNTER — Other Ambulatory Visit: Payer: Self-pay | Admitting: Internal Medicine

## 2014-05-07 ENCOUNTER — Other Ambulatory Visit: Payer: Self-pay | Admitting: Internal Medicine

## 2014-06-07 IMAGING — US US SOFT TISSUE HEAD/NECK
1 series · 14 of 23 positions shown · non-contrast
Comparison: None.

CLINICAL DATA: Possible cervical lymphadenopathy on physical exam.

EXAM:
ULTRASOUND OF HEAD/NECK SOFT TISSUES
TECHNIQUE: Ultrasound examination of the head and neck soft tissues was
performed in the area of clinical concern.

[Series 1: us soft tissue head/neck · 0.10mm/px · 14 of 23 slices shown]
[im 1/23]
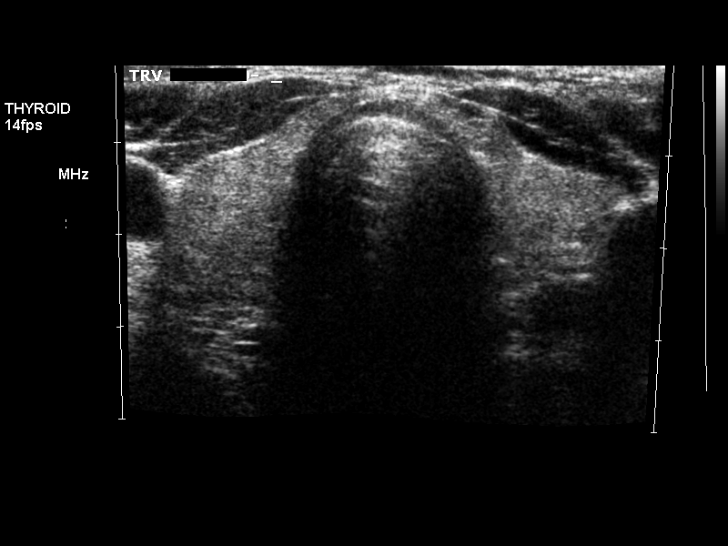
[im 3/23]
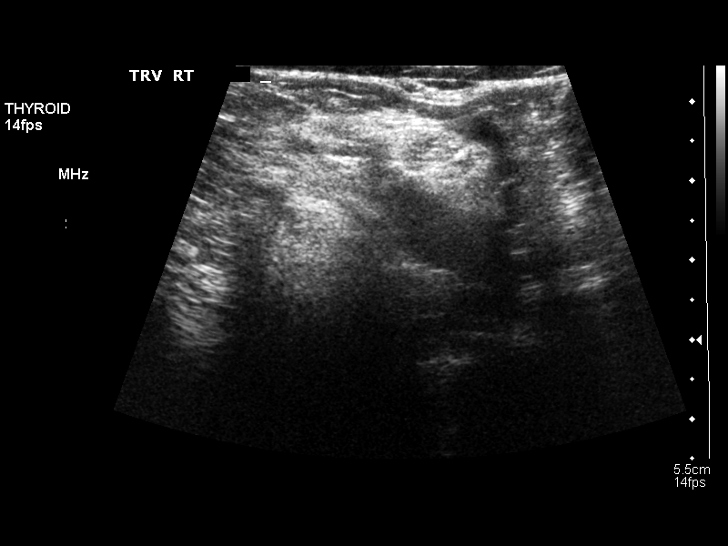
[im 5/23]
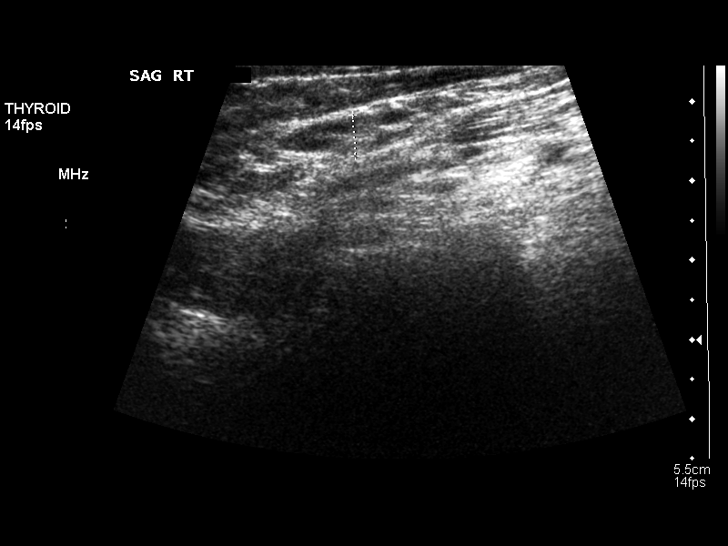
[im 6/23]
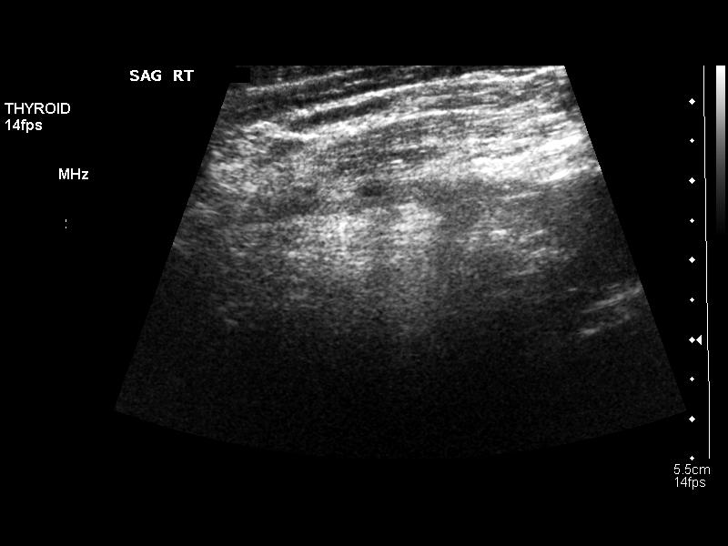
[im 8/23]
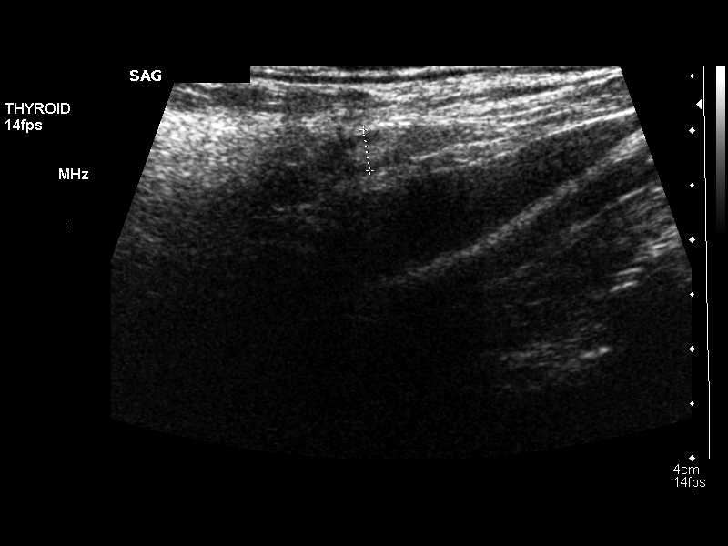
[im 10/23]
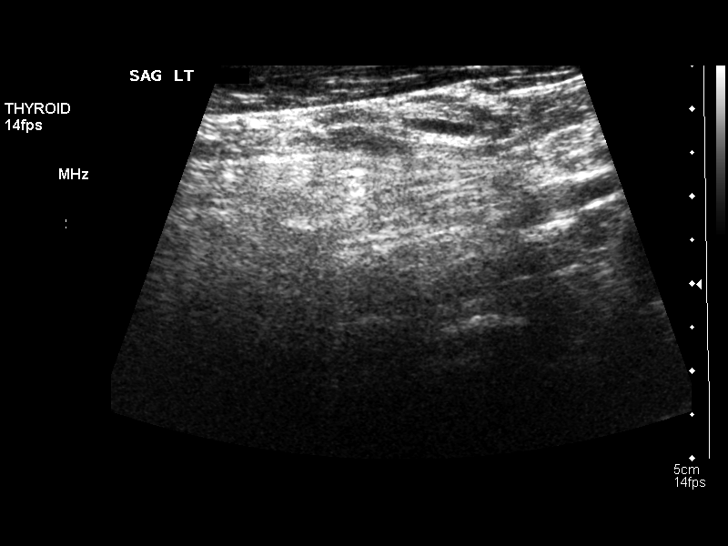
[im 11/23]
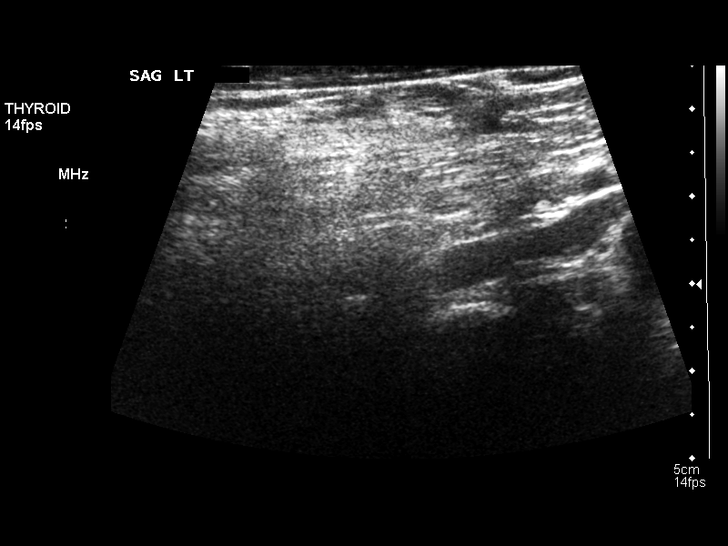
[im 13/23]
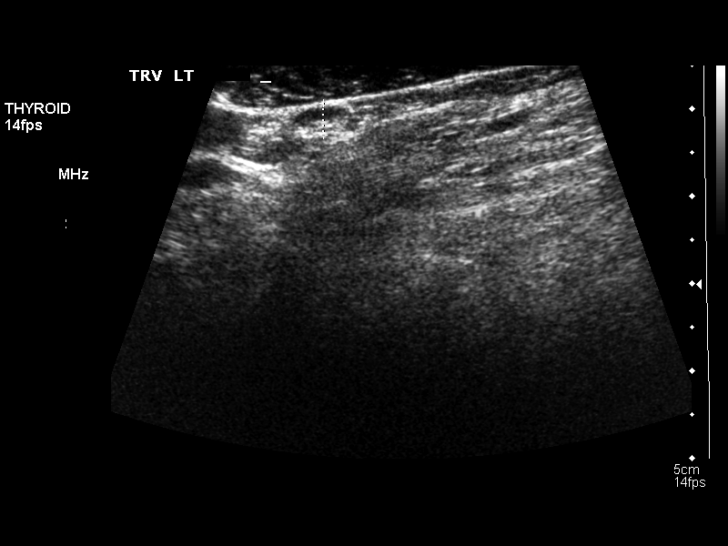
[im 14/23]
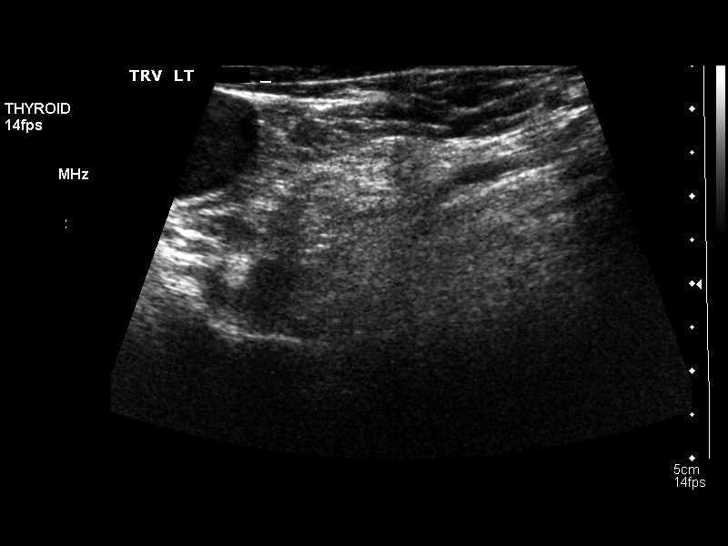
[im 16/23]
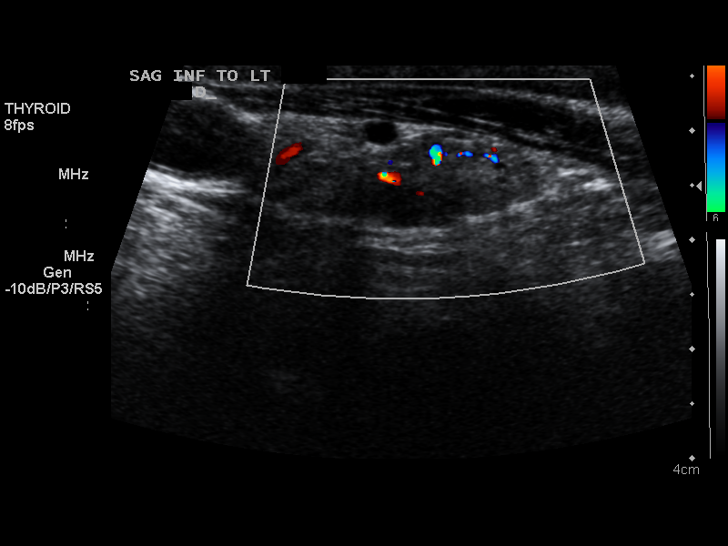
[im 18/23]
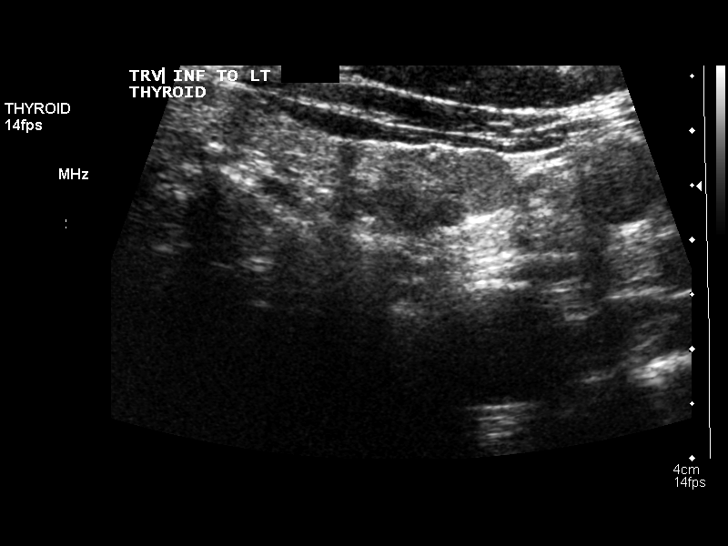
[im 19/23]
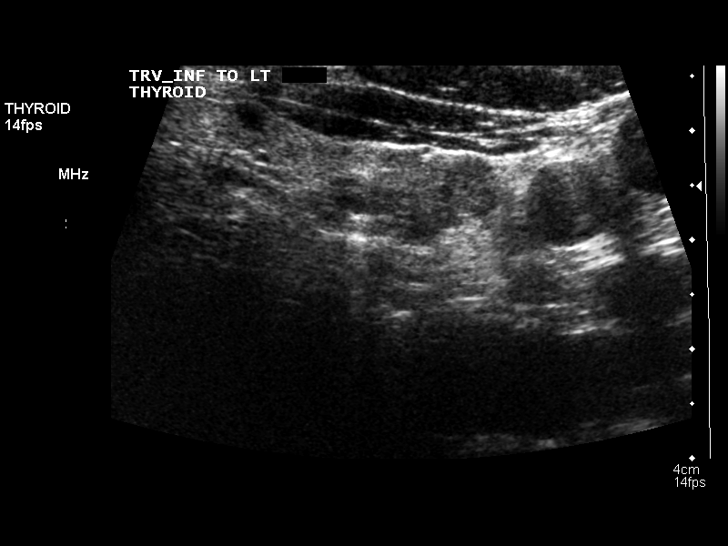
[im 21/23]
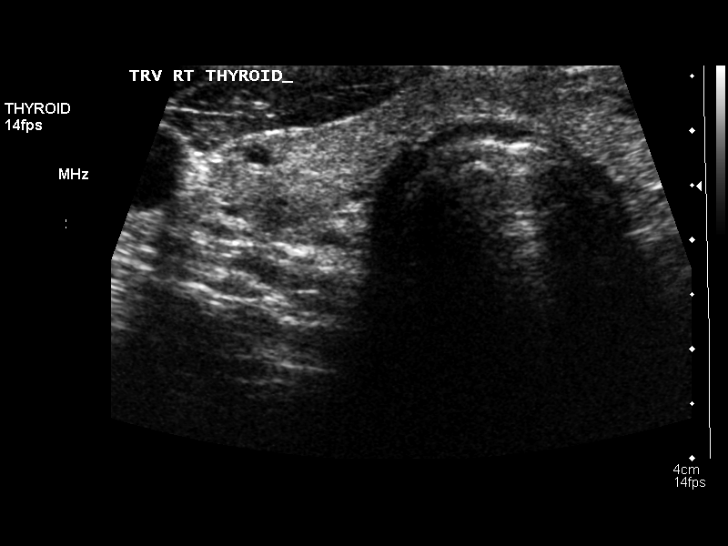
[im 23/23]
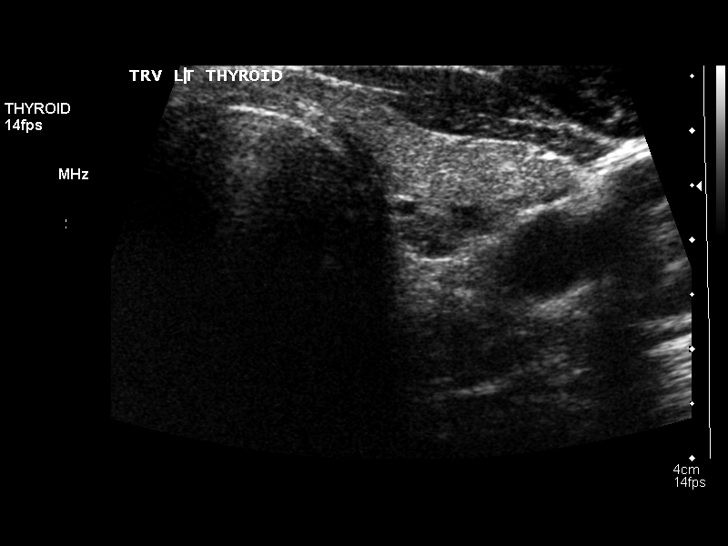

[14 of 23 positions shown; findings below may reference images not displayed]

FINDINGS: There is a 9 x 15 mm nodule projecting inferior to the left lobe of
the thyroid. Multiple normal-sized cervical lymph nodes bilaterally.
Thyroid appears within normal limits in size, with several small
nodules.
IMPRESSION: 1. 9 x 15 mm nodule inferior to the left lobe of the thyroid, may
represent exophytic thyroid nodule versus parathyroid adenoma or
borderline enlarged lymph node.
2. No other evidence of cervical adenopathy identified.

## 2014-06-11 ENCOUNTER — Other Ambulatory Visit: Payer: Self-pay | Admitting: Family Medicine

## 2014-06-11 NOTE — Telephone Encounter (Signed)
Refill done.  

## 2014-06-19 ENCOUNTER — Other Ambulatory Visit: Payer: Self-pay | Admitting: Internal Medicine

## 2014-06-20 ENCOUNTER — Other Ambulatory Visit: Payer: Self-pay | Admitting: Internal Medicine

## 2014-06-21 DIAGNOSIS — H2510 Age-related nuclear cataract, unspecified eye: Secondary | ICD-10-CM | POA: Diagnosis not present

## 2014-06-21 DIAGNOSIS — H4011X0 Primary open-angle glaucoma, stage unspecified: Secondary | ICD-10-CM | POA: Diagnosis not present

## 2014-06-26 ENCOUNTER — Other Ambulatory Visit (INDEPENDENT_AMBULATORY_CARE_PROVIDER_SITE_OTHER): Payer: Medicare Other

## 2014-06-26 ENCOUNTER — Ambulatory Visit (INDEPENDENT_AMBULATORY_CARE_PROVIDER_SITE_OTHER): Payer: Medicare Other | Admitting: Internal Medicine

## 2014-06-26 ENCOUNTER — Encounter: Payer: Self-pay | Admitting: Internal Medicine

## 2014-06-26 VITALS — BP 120/80 | HR 63 | Temp 98.5°F | Ht 69.0 in | Wt 185.0 lb

## 2014-06-26 DIAGNOSIS — R972 Elevated prostate specific antigen [PSA]: Secondary | ICD-10-CM

## 2014-06-26 DIAGNOSIS — I10 Essential (primary) hypertension: Secondary | ICD-10-CM | POA: Diagnosis not present

## 2014-06-26 DIAGNOSIS — R221 Localized swelling, mass and lump, neck: Secondary | ICD-10-CM

## 2014-06-26 DIAGNOSIS — E785 Hyperlipidemia, unspecified: Secondary | ICD-10-CM

## 2014-06-26 DIAGNOSIS — R220 Localized swelling, mass and lump, head: Secondary | ICD-10-CM | POA: Diagnosis not present

## 2014-06-26 LAB — LIPID PANEL
CHOL/HDL RATIO: 2
Cholesterol: 152 mg/dL (ref 0–200)
HDL: 62.9 mg/dL (ref >39.00)
LDL Cholesterol: 82 mg/dL (ref 0–99)
NONHDL: 89.1
Triglycerides: 37 mg/dL (ref 0.0–149.0)
VLDL: 7.4 mg/dL (ref 0.0–40.0)

## 2014-06-26 LAB — PSA: PSA: 1.65 ng/mL (ref 0.10–4.00)

## 2014-06-26 NOTE — Progress Notes (Signed)
Subjective:    Patient ID: [name], male    DOB: [birth year], 73 y.o.   MRN: [id number]  HPI  Here to f/u, has seen ENT for recent abnormal neck u/s, per pt no further eval needed at that time.  Has lost wt intentionally. Wt Readings from Last 3 Encounters:  06/26/14 185 lb (83.915 kg)  03/22/14 194 lb 3.2 oz (88.089 kg)  12/26/13 195 lb 6 oz (88.622 kg)  with better diet.  Has also recent elev PSA : Lab Results  Component Value Date   PSA 1.65 06/26/2014   PSA 1.97 12/26/2013   PSA 0.97 12/26/2012   But improved for now, following with planned psa f/u.  Otherwise Pt denies chest pain, increased sob or doe, wheezing, orthopnea, PND, increased LE swelling, palpitations, dizziness or syncope.  Pt denies polydipsia, polyuria, or low sugar symptoms such as weakness or confusion improved with po intake.  Pt denies new neurological symptoms such as new headache, or facial or extremity weakness or numbness.   Pt states overall good compliance with meds.  Denies urinary symptoms such as dysuria, frequency, urgency, flank pain, hematuria or n/v, fever, chills. Past Medical History  Diagnosis Date  . Hypertension   . Hyperlipidemia   . BPH (benign prostatic hyperplasia)   . Diverticulosis of colon (without mention of hemorrhage)   . Esophageal reflux   . Abdominal pain, epigastric   . Depressive disorder, not elsewhere classified   . Lumbago   . Allergic rhinitis, cause unspecified   . 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 Persistent atrial fibrillation   . Chronic anticoagulation     xarelto  . High risk medication use   . Paralyzed hemidiaphragm     LEFT  . Coronary artery calcification seen on CAT scan 12/26/2012  . Arthritis   . Shortness of breath     sob with exertion  . Pacemaker    Past Surgical History  Procedure Laterality Date  . Insert / replace / remove pacemaker  05/2009     [facility] scientific ALTRUA 60  (serial number [id number])  dual-chamber pacemaker  . Pacemaker placement Bilateral     '05  . Esophagogastroduodenoscopy N/A 12/05/2013    Procedure: ESOPHAGOGASTRODUODENOSCOPY (EGD);  Surgeon: [name], MD;  Location: [facility];  Service: Endoscopy;  Laterality: N/A;    reports that he quit smoking about 35 years ago. His smoking use included Cigarettes. He has a 12.5 pack-year smoking history. He has quit using smokeless tobacco. His smokeless tobacco use included Chew. He reports that he does not drink alcohol or use illicit drugs. family history includes Arthritis in his paternal grandmother; Asthma in his sister; Cancer in his father; Diabetes in his mother; Hypertension in his brother, daughter, father, mother, sister, and son; Lung cancer in his father. Allergies  Allergen Reactions  . Pantoprazole Sodium Itching   Current Outpatient Prescriptions on File Prior to Visit  Medication Sig Dispense Refill  . acetaminophen (TYLENOL ARTHRITIS PAIN) 650 MG CR tablet Take 650 mg by mouth every 8 (eight) hours as needed for pain.      Marland Kitchen amLODipine (NORVASC) 10 MG tablet TAKE 1 TABLET (10 MG TOTAL) BY MOUTH DAILY.  90 tablet  3  . atorvastatin (LIPITOR) 40 MG tablet TAKE 1 TABLET BY MOUTH DAILY  90 tablet  1  . [facility] ALLERGY RELIEF 180 MG tablet TAKE 1 TABLET (180 MG TOTAL) BY MOUTH DAILY.  90 tablet  3  . fexofenadine (ALLEGRA) 180 MG tablet Take 180 mg by mouth daily.      . flecainide (TAMBOCOR) 100 MG tablet Take 100 mg by mouth 2 (two) times daily.      Marland Kitchen latanoprost (XALATAN) 0.005 % ophthalmic solution Place 1 drop into both eyes daily.      Marland Kitchen lisinopril (PRINIVIL,ZESTRIL) 10 MG tablet TAKE 1 TABLET (10 MG TOTAL) BY MOUTH EVERY MORNING.  90 tablet  1  . omeprazole (PRILOSEC) 20 MG capsule Take 1 tab twice daily  60 capsule  4  . oxybutynin (DITROPAN-XL) 10 MG 24 hr tablet Take 10 mg by mouth daily.      Marland Kitchen oxybutynin (DITROPAN-XL) 10 MG 24 hr tablet TAKE 1 TABLET (10 MG TOTAL) BY MOUTH DAILY.  90 tablet  2  .  oxybutynin (DITROPAN-XL) 10 MG 24 hr tablet TAKE 1 TABLET (10 MG TOTAL) BY MOUTH DAILY.  90 tablet  3  . Rivaroxaban (XARELTO) 20 MG TABS Take 20 mg by mouth daily with supper.       . tamsulosin (FLOMAX) 0.4 MG CAPS capsule Take 0.4 mg by mouth twice per day  60 capsule  11  . traMADol (ULTRAM) 50 MG tablet TAKE 1 TABLET BY MOUTH EVERY 6 HOURS AS NEEDED FOR PAIN  120 tablet  2  . XARELTO 20 MG TABS tablet TAKE 1 TABLET BY MOUTH EVERY DAY  90 tablet  3  . [DISCONTINUED] furosemide (LASIX) 40 MG tablet Take 1 tablet (40 mg total) by mouth daily.  30 tablet  11  . [DISCONTINUED] oxybutynin (DITROPAN-XL) 10 MG 24 hr tablet Take 1 tablet (10 mg total) by mouth daily.  90 tablet  3   No current facility-administered medications on file prior to visit.   Review of Systems  Constitutional: Negative for unusual diaphoresis or other sweats  HENT: Negative for ringing in ear Eyes: Negative for double vision or worsening visual disturbance.  Respiratory: Negative for choking and stridor.   Gastrointestinal: Negative for vomiting or other signifcant bowel change Genitourinary: Negative for hematuria or decreased urine volume.  Musculoskeletal: Negative for other MSK pain or swelling Skin: Negative for color change and worsening wound.  Neurological: Negative for tremors and numbness other than noted  Psychiatric/Behavioral: Negative for decreased concentration or agitation other than above       Objective:   Physical Exam BP 120/80  Pulse 63  Temp(Src) 98.5 F (36.9 C) (Oral)  Ht 5\' 9"  (1.753 m)  Wt 185 lb (83.915 kg)  BMI 27.31 kg/m2  SpO2 97% VS noted,  Constitutional: Pt appears well-developed, well-nourished.  HENT: Head: NCAT.  Right Ear: External ear normal.  Left Ear: External ear normal.  Eyes: . Pupils are equal, round, and reactive to light. Conjunctivae and EOM are normal Neck: Normal range of motion. Neck supple.  Cardiovascular: Normal rate and regular rhythm.     Pulmonary/Chest: Effort normal and breath sounds normal.  Abd:  Soft, NT, ND, + BS Neurological: Pt is alert. Not confused , motor grossly intact Skin: Skin is warm. No rash Psychiatric: Pt behavior is normal. No agitation.     Assessment & Plan:

## 2014-06-26 NOTE — Patient Instructions (Addendum)
Please continue all other medications as before, and refills have been done if requested.  Please have the pharmacy call with any other refills you may need.  Please continue your efforts at being more active, low cholesterol diet, and weight control.  You are otherwise up to date with prevention measures today.  Please keep your appointments with your specialists as you may have planned  Please go to the LAB in the Basement (turn left off the elevator) for the tests to be done today  You will be contacted by phone if any changes need to be made immediately.  Otherwise, you will receive a letter about your results with an explanation, but please check with MyChart first.  Please return in 6 months, or sooner if needed 

## 2014-06-26 NOTE — Progress Notes (Signed)
Pre visit review using our clinic review tool, if applicable. No additional management support is needed unless otherwise documented below in the visit note. 

## 2014-06-26 NOTE — Assessment & Plan Note (Signed)
Apparently felt benign per ENT, no further eval or tx

## 2014-06-29 ENCOUNTER — Other Ambulatory Visit: Payer: Self-pay | Admitting: Internal Medicine

## 2014-07-02 NOTE — Assessment & Plan Note (Signed)
stable overall by history and exam, recent data reviewed with pt, and pt to continue medical treatment as before,  to f/u any worsening symptoms or concerns Lab Results  Component Value Date   LDLCALC 82 06/26/2014

## 2014-07-02 NOTE — Assessment & Plan Note (Signed)
For f/u psa next visit

## 2014-07-02 NOTE — Assessment & Plan Note (Signed)
stable overall by history and exam, recent data reviewed with pt, and pt to continue medical treatment as before,  to f/u any worsening symptoms or concerns BP Readings from Last 3 Encounters:  06/26/14 120/80  03/22/14 118/70  12/26/13 122/78

## 2014-07-24 ENCOUNTER — Other Ambulatory Visit: Payer: Self-pay | Admitting: Physician Assistant

## 2014-07-28 ENCOUNTER — Other Ambulatory Visit: Payer: Self-pay | Admitting: Physician Assistant

## 2014-08-21 ENCOUNTER — Encounter: Payer: Self-pay | Admitting: *Deleted

## 2014-08-21 ENCOUNTER — Other Ambulatory Visit: Payer: Self-pay | Admitting: Otolaryngology

## 2014-08-26 DIAGNOSIS — Z23 Encounter for immunization: Secondary | ICD-10-CM | POA: Diagnosis not present

## 2014-09-17 ENCOUNTER — Other Ambulatory Visit: Payer: Self-pay | Admitting: Internal Medicine

## 2014-09-17 MED ORDER — TRAMADOL HCL 50 MG PO TABS: ORAL_TABLET | ORAL | Status: DC

## 2014-09-17 NOTE — Telephone Encounter (Signed)
Done hardcopy to robin  

## 2014-09-18 NOTE — Telephone Encounter (Signed)
Faxed hardcopy for Tramadol to [facility]

## 2014-10-08 ENCOUNTER — Encounter: Payer: Self-pay | Admitting: Internal Medicine

## 2014-10-08 ENCOUNTER — Ambulatory Visit (INDEPENDENT_AMBULATORY_CARE_PROVIDER_SITE_OTHER): Payer: Medicare Other | Admitting: Internal Medicine

## 2014-10-08 VITALS — BP 130/62 | HR 60 | Ht 69.0 in | Wt 185.8 lb

## 2014-10-08 DIAGNOSIS — I480 Paroxysmal atrial fibrillation: Secondary | ICD-10-CM | POA: Diagnosis not present

## 2014-10-08 DIAGNOSIS — I495 Sick sinus syndrome: Secondary | ICD-10-CM

## 2014-10-08 DIAGNOSIS — I10 Essential (primary) hypertension: Secondary | ICD-10-CM

## 2014-10-08 LAB — MDC_IDC_ENUM_SESS_TYPE_INCLINIC
Brady Statistic RA Percent Paced: 70 %
Brady Statistic RV Percent Paced: 6 %
Date Time Interrogation Session: 20151123050000
Implantable Pulse Generator Serial Number: 764370
Lead Channel Pacing Threshold Amplitude: 0.8 V
Lead Channel Pacing Threshold Amplitude: 0.9 V
Lead Channel Pacing Threshold Amplitude: 1 V
Lead Channel Pacing Threshold Amplitude: 1.8 V
Lead Channel Pacing Threshold Pulse Width: 0.4 ms
Lead Channel Pacing Threshold Pulse Width: 1 ms
Lead Channel Pacing Threshold Pulse Width: 1 ms
Lead Channel Sensing Intrinsic Amplitude: 1.5 mV
Lead Channel Sensing Intrinsic Amplitude: 7 mV
Lead Channel Setting Pacing Amplitude: 2 V
Lead Channel Setting Pacing Amplitude: 2.4 V
Lead Channel Setting Pacing Pulse Width: 1 ms
Lead Channel Setting Sensing Sensitivity: 2.5 mV
MDC IDC MSMT LEADCHNL RA IMPEDANCE VALUE: 540 Ohm
MDC IDC MSMT LEADCHNL RV IMPEDANCE VALUE: 620 Ohm
MDC IDC MSMT LEADCHNL RV PACING THRESHOLD PULSEWIDTH: 1 ms

## 2014-10-08 NOTE — Progress Notes (Signed)
PCP: Cathlean Cower, MD  [name] is a 73 y.o. male who presents today for routine electrophysiology followup.  Since last being seen in our clinic, the patient reports doing reasonalbly well.   His SOB seems better.  He has rare dizziness when he bends over but is otherwise without complaint.  Today, he denies symptoms of palpitations, chest pain, presyncope, or syncope.  The patient is otherwise without complaint today.   Past Medical History  Diagnosis Date  . Hypertension   . Hyperlipidemia   . BPH (benign prostatic hyperplasia)   . Diverticulosis of colon (without mention of hemorrhage)   . Esophageal reflux   . Abdominal pain, epigastric   . Depressive disorder, not elsewhere classified   . Lumbago   . Allergic rhinitis, cause unspecified   . 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 Persistent atrial fibrillation   . Chronic anticoagulation     xarelto  . High risk medication use   . Paralyzed hemidiaphragm     LEFT  . Coronary artery calcification seen on CAT scan 12/26/2012  . Arthritis   . Shortness of breath     sob with exertion  . Pacemaker    Past Surgical History  Procedure Laterality Date  . Insert / replace / remove pacemaker  05/2009     [facility] scientific ALTRUA 60  (serial number [id number]) dual-chamber pacemaker  . Pacemaker placement Bilateral     '05  . Esophagogastroduodenoscopy N/A 12/05/2013    Procedure: ESOPHAGOGASTRODUODENOSCOPY (EGD);  Surgeon: [name], MD;  Location: [facility];  Service: Endoscopy;  Laterality: N/A;    Current Outpatient Prescriptions  Medication Sig Dispense Refill  . acetaminophen (TYLENOL ARTHRITIS PAIN) 650 MG CR tablet Take 650 mg by mouth every 8 (eight) hours as needed for pain.    Marland Kitchen amLODipine (NORVASC) 10 MG tablet TAKE 1 TABLET (10 MG TOTAL) BY MOUTH DAILY. 90 tablet 3  . atorvastatin (LIPITOR) 40 MG tablet TAKE 1 TABLET BY MOUTH DAILY 90 tablet 1  . [facility] ALLERGY RELIEF 180 MG tablet  TAKE 1 TABLET (180 MG TOTAL) BY MOUTH DAILY. 90 tablet 3  . flecainide (TAMBOCOR) 100 MG tablet Take 100 mg by mouth 2 (two) times daily.    . fluticasone (FLONASE) 50 MCG/ACT nasal spray USE 2 SPRAYS IN EACH NOSTRIL ONCE A DAY 16 g 5  . latanoprost (XALATAN) 0.005 % ophthalmic solution Place 1 drop into both eyes daily.    Marland Kitchen lisinopril (PRINIVIL,ZESTRIL) 10 MG tablet TAKE 1 TABLET (10 MG TOTAL) BY MOUTH EVERY MORNING. 90 tablet 1  . omeprazole (PRILOSEC) 20 MG capsule TAKE ONE CAPSULE TWICE A DAY (Patient taking differently: TAKE ONE CAPSULE BY MOUTH TWICE A DAY) 60 capsule 4  . oxybutynin (DITROPAN-XL) 10 MG 24 hr tablet TAKE 1 TABLET (10 MG TOTAL) BY MOUTH DAILY. 90 tablet 3  . tamsulosin (FLOMAX) 0.4 MG CAPS capsule Take 0.4 mg by mouth twice per day 60 capsule 11  . traMADol (ULTRAM) 50 MG tablet TAKE 1 TABLET EVERY 6 HOURS AS NEEDED FOR PAIN (Patient taking differently: TAKE 1 TABLET BY MOUTH EVERY 6 HOURS AS NEEDED FOR PAIN) 120 tablet 2  . XARELTO 20 MG TABS tablet TAKE 1 TABLET BY MOUTH EVERY DAY 90 tablet 3  . [DISCONTINUED] furosemide (LASIX) 40 MG tablet Take 1 tablet (40 mg total) by mouth daily. 30 tablet 11  . [DISCONTINUED] oxybutynin (DITROPAN-XL) 10 MG 24 hr tablet  Take 1 tablet (10 mg total) by mouth daily. 90 tablet 3   No current facility-administered medications for this visit.    Physical Exam: Filed Vitals:   10/08/14 1057  BP: 130/62  Pulse: 60  Height: 5\' 9"  (1.753 m)  Weight: 185 lb 12.8 oz (84.278 kg)    GEN- The patient is well appearing, alert and oriented x 3 today.   Head- normocephalic, atraumatic Eyes-  Sclera clear, conjunctiva pink Ears- hearing intact Oropharynx- clear Lungs- Clear to ausculation bilaterally, normal work of breathing Chest- pacemaker pocket is well healed Heart- Regular rate and rhythm, no murmurs, rubs or gallops, PMI not laterally displaced GI- soft, NT, ND, + BS Extremities- no clubbing, cyanosis, trace edema in R  leg  Pacemaker interrogation- reviewed in detail today,  See PACEART report  Assessment and Plan:  1. Sick sinus syndrome/ symptomatic bradycardia Normal pacemaker function See Pace Art report No changes today  2. afib Well controlled with flecainide Continue xarelto for stroke prevention  3. HTN Stable No change required today  4. HL Stable No change required today  Return to the device clinic in 6 months I will see in 1 year

## 2014-10-08 NOTE — Patient Instructions (Signed)
Your physician wants you to follow-up in: 6 months in the device clinic and 12 months with Dr Allred You will receive a reminder letter in the mail two months in advance. If you don't receive a letter, please call our office to schedule the follow-up appointment.  

## 2014-11-06 ENCOUNTER — Other Ambulatory Visit: Payer: Self-pay | Admitting: Internal Medicine

## 2014-12-06 ENCOUNTER — Other Ambulatory Visit: Payer: Self-pay | Admitting: Internal Medicine

## 2014-12-07 ENCOUNTER — Other Ambulatory Visit: Payer: Self-pay | Admitting: Internal Medicine

## 2014-12-13 ENCOUNTER — Encounter: Payer: Self-pay | Admitting: Gastroenterology

## 2014-12-18 ENCOUNTER — Encounter: Payer: Self-pay | Admitting: Internal Medicine

## 2014-12-20 ENCOUNTER — Other Ambulatory Visit: Payer: Self-pay | Admitting: Internal Medicine

## 2014-12-27 DIAGNOSIS — H2513 Age-related nuclear cataract, bilateral: Secondary | ICD-10-CM | POA: Diagnosis not present

## 2014-12-27 DIAGNOSIS — H4011X1 Primary open-angle glaucoma, mild stage: Secondary | ICD-10-CM | POA: Diagnosis not present

## 2015-01-01 ENCOUNTER — Ambulatory Visit (INDEPENDENT_AMBULATORY_CARE_PROVIDER_SITE_OTHER): Payer: Medicare Other | Admitting: Internal Medicine

## 2015-01-01 ENCOUNTER — Other Ambulatory Visit (INDEPENDENT_AMBULATORY_CARE_PROVIDER_SITE_OTHER): Payer: Medicare Other

## 2015-01-01 ENCOUNTER — Encounter: Payer: Self-pay | Admitting: Internal Medicine

## 2015-01-01 VITALS — BP 140/90 | HR 68 | Temp 98.4°F | Wt 197.0 lb

## 2015-01-01 DIAGNOSIS — R972 Elevated prostate specific antigen [PSA]: Secondary | ICD-10-CM

## 2015-01-01 DIAGNOSIS — E785 Hyperlipidemia, unspecified: Secondary | ICD-10-CM | POA: Diagnosis not present

## 2015-01-01 DIAGNOSIS — I10 Essential (primary) hypertension: Secondary | ICD-10-CM | POA: Diagnosis not present

## 2015-01-01 LAB — CBC WITH DIFFERENTIAL/PLATELET
BASOS PCT: 1.5 % (ref 0.0–3.0)
Basophils Absolute: 0.1 10*3/uL (ref 0.0–0.1)
Eosinophils Absolute: 0.1 10*3/uL (ref 0.0–0.7)
Eosinophils Relative: 1 % (ref 0.0–5.0)
HEMATOCRIT: 45.6 % (ref 39.0–52.0)
Hemoglobin: 15.2 g/dL (ref 13.0–17.0)
LYMPHS ABS: 2.2 10*3/uL (ref 0.7–4.0)
Lymphocytes Relative: 28.2 % (ref 12.0–46.0)
MCHC: 33.3 g/dL (ref 30.0–36.0)
MCV: 89.7 fl (ref 78.0–100.0)
MONO ABS: 0.8 10*3/uL (ref 0.1–1.0)
MONOS PCT: 9.7 % (ref 3.0–12.0)
NEUTROS PCT: 59.6 % (ref 43.0–77.0)
Neutro Abs: 4.6 10*3/uL (ref 1.4–7.7)
PLATELETS: 197 10*3/uL (ref 150.0–400.0)
RBC: 5.08 Mil/uL (ref 4.22–5.81)
RDW: 13.9 % (ref 11.5–15.5)
WBC: 7.8 10*3/uL (ref 4.0–10.5)

## 2015-01-01 LAB — URINALYSIS, ROUTINE W REFLEX MICROSCOPIC
Bilirubin Urine: NEGATIVE
Hgb urine dipstick: NEGATIVE
KETONES UR: NEGATIVE
Leukocytes, UA: NEGATIVE
Nitrite: NEGATIVE
PH: 6 (ref 5.0–8.0)
Specific Gravity, Urine: 1.025 (ref 1.000–1.030)
Total Protein, Urine: NEGATIVE
UROBILINOGEN UA: 0.2 (ref 0.0–1.0)
Urine Glucose: NEGATIVE

## 2015-01-01 LAB — BASIC METABOLIC PANEL
BUN: 14 mg/dL (ref 6–23)
CALCIUM: 9.6 mg/dL (ref 8.4–10.5)
CO2: 28 mEq/L (ref 19–32)
Chloride: 104 mEq/L (ref 96–112)
Creatinine, Ser: 0.92 mg/dL (ref 0.40–1.50)
GFR: 104.09 mL/min (ref >60.00)
Glucose, Bld: 95 mg/dL (ref 70–99)
Potassium: 4.1 mEq/L (ref 3.5–5.1)
SODIUM: 139 meq/L (ref 135–145)

## 2015-01-01 LAB — LIPID PANEL
CHOL/HDL RATIO: 3
Cholesterol: 185 mg/dL (ref 0–200)
HDL: 65.1 mg/dL (ref >39.00)
LDL Cholesterol: 104 mg/dL — ABNORMAL HIGH (ref 0–99)
NONHDL: 119.9
TRIGLYCERIDES: 79 mg/dL (ref 0.0–149.0)
VLDL: 15.8 mg/dL (ref 0.0–40.0)

## 2015-01-01 LAB — HEPATIC FUNCTION PANEL
ALT: 20 U/L (ref 0–53)
AST: 17 U/L (ref 0–37)
Albumin: 4.3 g/dL (ref 3.5–5.2)
Alkaline Phosphatase: 67 U/L (ref 39–117)
BILIRUBIN TOTAL: 0.6 mg/dL (ref 0.2–1.2)
Bilirubin, Direct: 0.1 mg/dL (ref 0.0–0.3)
Total Protein: 7.1 g/dL (ref 6.0–8.3)

## 2015-01-01 LAB — TSH: TSH: 1.05 u[IU]/mL (ref 0.35–4.50)

## 2015-01-01 LAB — PSA: PSA: 1.72 ng/mL (ref 0.10–4.00)

## 2015-01-01 NOTE — Assessment & Plan Note (Signed)
stable overall by history and exam, recent data reviewed with pt, and pt to continue medical treatment as before,  to f/u any worsening symptoms or concerns Lab Results  Component Value Date   [place] 82 06/26/2014   For f/u labs

## 2015-01-01 NOTE — Assessment & Plan Note (Signed)
For f/u psa as well, was improved last visit

## 2015-01-01 NOTE — Progress Notes (Signed)
Subjective:    Patient ID: [name], male    DOB: 24-Feb-1941, 74 y.o.   MRN: [id number]  HPI    Here for yearly f/u;  Overall doing ok;  Pt denies CP, worsening SOB, DOE, wheezing, orthopnea, PND, worsening LE edema, palpitations, dizziness or syncope.  Pt denies neurological change such as new headache, facial or extremity weakness.  Pt denies polydipsia, polyuria, or low sugar symptoms. Pt states overall good compliance with treatment and medications, good tolerability, and has been trying to follow lower cholesterol diet.  Pt denies worsening depressive symptoms, suicidal ideation or panic. No fever, night sweats, wt loss, loss of appetite, or other constitutional symptoms.  Pt states good ability with ADL's, has low fall risk, home safety reviewed and adequate, no other significant changes in hearing or vision, and only occasionally active with exercise. Pt continues to have recurring LBP without change in severity, bowel or bladder change, fever, wt loss,  worsening LE pain/numbness/weakness, gait change or falls.  No ETOH since 1990, has prior hx of pancreatitis due to ETOH.  Pt states letter sent per Dr Dennie Fetters and colonoscopy now due 2019. Past Medical History  Diagnosis Date  . Hypertension   . Hyperlipidemia   . BPH (benign prostatic hyperplasia)   . Diverticulosis of colon (without mention of hemorrhage)   . Esophageal reflux   . Abdominal pain, epigastric   . Depressive disorder, not elsewhere classified   . Lumbago   . Allergic rhinitis, cause unspecified   . 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 Persistent atrial fibrillation   . Chronic anticoagulation     xarelto  . High risk medication use   . Paralyzed hemidiaphragm     LEFT  . Coronary artery calcification seen on CAT scan 12/26/2012  . Arthritis   . Shortness of breath     sob with exertion  . Pacemaker   . Hyperplastic rectal polyp    Past Surgical History  Procedure Laterality  Date  . Insert / replace / remove pacemaker  05/2009     [facility] scientific ALTRUA 60  (serial number [id number]) dual-chamber pacemaker  . Pacemaker placement Bilateral     '05  . Esophagogastroduodenoscopy N/A 12/05/2013    Procedure: ESOPHAGOGASTRODUODENOSCOPY (EGD);  Surgeon: [name], MD;  Location: [facility];  Service: Endoscopy;  Laterality: N/A;    reports that he quit smoking about 36 years ago. His smoking use included Cigarettes. He has a 12.5 pack-year smoking history. He has quit using smokeless tobacco. His smokeless tobacco use included Chew. He reports that he does not drink alcohol or use illicit drugs. family history includes Arthritis in his paternal grandmother; Asthma in his sister; Cancer in his father; Diabetes in his mother; Hypertension in his brother, daughter, father, mother, sister, and son; Lung cancer in his father. Allergies  Allergen Reactions  . Pantoprazole Sodium Itching   Current Outpatient Prescriptions on File Prior to Visit  Medication Sig Dispense Refill  . acetaminophen (TYLENOL ARTHRITIS PAIN) 650 MG CR tablet Take 650 mg by mouth every 8 (eight) hours as needed for pain.    Marland Kitchen amLODipine (NORVASC) 10 MG tablet TAKE 1 TABLET BY MOUTH EVERY DAY 90 tablet 3  . atorvastatin (LIPITOR) 40 MG tablet TAKE 1 TABLET BY MOUTH DAILY 90 tablet 3  . [facility] ALLERGY RELIEF 180 MG tablet TAKE 1 TABLET (180 MG TOTAL) BY MOUTH DAILY. 90 tablet 3  .  flecainide (TAMBOCOR) 100 MG tablet TAKE 1 TABLET BY MOUTH TWICE A DAY 180 tablet 3  . fluticasone (FLONASE) 50 MCG/ACT nasal spray USE 2 SPRAYS IN EACH NOSTRIL ONCE A DAY 16 g 5  . latanoprost (XALATAN) 0.005 % ophthalmic solution Place 1 drop into both eyes daily.    Marland Kitchen lisinopril (PRINIVIL,ZESTRIL) 10 MG tablet TAKE 1 TABLET BY MOUTH EVERY MORNING 90 tablet 3  . omeprazole (PRILOSEC) 20 MG capsule TAKE ONE CAPSULE TWICE A DAY (Patient taking differently: TAKE ONE CAPSULE BY MOUTH TWICE A DAY) 60 capsule 4  . oxybutynin  (DITROPAN-XL) 10 MG 24 hr tablet TAKE 1 TABLET (10 MG TOTAL) BY MOUTH DAILY. 90 tablet 3  . tamsulosin (FLOMAX) 0.4 MG CAPS capsule Take 0.4 mg by mouth twice per day 60 capsule 11  . traMADol (ULTRAM) 50 MG tablet TAKE 1 TABLET EVERY 6 HOURS AS NEEDED FOR PAIN (Patient taking differently: TAKE 1 TABLET BY MOUTH EVERY 6 HOURS AS NEEDED FOR PAIN) 120 tablet 2  . XARELTO 20 MG TABS tablet TAKE 1 TABLET BY MOUTH EVERY DAY 90 tablet 3  . [DISCONTINUED] furosemide (LASIX) 40 MG tablet Take 1 tablet (40 mg total) by mouth daily. 30 tablet 11  . [DISCONTINUED] oxybutynin (DITROPAN-XL) 10 MG 24 hr tablet Take 1 tablet (10 mg total) by mouth daily. 90 tablet 3   No current facility-administered medications on file prior to visit.   Review of Systems Constitutional: Negative for increased diaphoresis, other activity, appetite or other siginficant weight change  HENT: Negative for worsening hearing loss, ear pain, facial swelling, mouth sores and neck stiffness.   Eyes: Negative for other worsening pain, redness or visual disturbance.  Respiratory: Negative for shortness of breath and wheezing.   Cardiovascular: Negative for chest pain and palpitations.  Gastrointestinal: Negative for diarrhea, blood in stool, abdominal distention or other pain Genitourinary: Negative for hematuria, flank pain or change in urine volume.  Musculoskeletal: Negative for myalgias or other joint complaints.  Skin: Negative for color change and wound.  Neurological: Negative for syncope and numbness. other than noted Hematological: Negative for adenopathy. or other swelling Psychiatric/Behavioral: Negative for hallucinations, self-injury, decreased concentration or other worsening agitation.      Objective:   Physical Exam BP 140/90 mmHg  Pulse 68  Temp(Src) 98.4 F (36.9 C) (Oral)  Wt 197 lb (89.359 kg) VS noted,  Constitutional: Pt is oriented to person, place, and time. Appears well-developed and well-nourished.    Head: Normocephalic and atraumatic.  Right Ear: External ear normal.  Left Ear: External ear normal.  Nose: Nose normal.  Mouth/Throat: Oropharynx is clear and moist.  Eyes: Conjunctivae and EOM are normal. Pupils are equal, round, and reactive to light.  Neck: Normal range of motion. Neck supple. No JVD present. No tracheal deviation present.  Cardiovascular: Normal rate, regular rhythm, normal heart sounds and intact distal pulses.   Pulmonary/Chest: Effort normal and breath sounds without rales or wheezing  Abdominal: Soft. Bowel sounds are normal. NT. No HSM  Musculoskeletal: Normal range of motion. Exhibits no edema.  Lymphadenopathy:  Has no cervical adenopathy.  Neurological: Pt is alert and oriented to person, place, and time. Pt has normal reflexes. No cranial nerve deficit. Motor grossly intact Skin: Skin is warm and dry. No rash noted.  Psychiatric:  Has normal mood and affect. Behavior is normal.      Assessment & Plan:

## 2015-01-01 NOTE — Progress Notes (Signed)
Pre visit review using our clinic review tool, if applicable. No additional management support is needed unless otherwise documented below in the visit note. 

## 2015-01-01 NOTE — Patient Instructions (Signed)

## 2015-01-01 NOTE — Assessment & Plan Note (Signed)
stable overall by history and exam, recent data reviewed with pt, and pt to continue medical treatment as before,  to f/u any worsening symptoms or concerns BP Readings from Last 3 Encounters:  01/01/15 140/90  10/08/14 130/62  06/26/14 120/80

## 2015-01-21 ENCOUNTER — Encounter: Payer: Self-pay | Admitting: Family

## 2015-01-21 ENCOUNTER — Ambulatory Visit (INDEPENDENT_AMBULATORY_CARE_PROVIDER_SITE_OTHER): Payer: Medicare Other | Admitting: Family

## 2015-01-21 VITALS — BP 140/82 | HR 64 | Temp 98.1°F | Resp 18 | Ht 69.0 in | Wt 190.8 lb

## 2015-01-21 DIAGNOSIS — R05 Cough: Secondary | ICD-10-CM | POA: Diagnosis not present

## 2015-01-21 DIAGNOSIS — R059 Cough, unspecified: Secondary | ICD-10-CM

## 2015-01-21 MED ORDER — CEFUROXIME AXETIL 250 MG PO TABS: 250.0000 mg | ORAL_TABLET | Freq: Two times a day (BID) | ORAL | Status: DC

## 2015-01-21 NOTE — Progress Notes (Signed)
Pre visit review using our clinic review tool, if applicable. No additional management support is needed unless otherwise documented below in the visit note. 

## 2015-01-21 NOTE — Progress Notes (Signed)
Subjective:    Patient ID: [name], male    DOB: [birth year], 74 y.o.   MRN: [id number]  Chief Complaint  Patient presents with  . Cough    x2 weeks, productive cough, drainage, little congestion    HPI:  [name] is a 74 y.o. male who presents today for an acute visit.  This is a new problem. Associated symptoms of productive cough, drainage and some congestion has been going on for about 2 weeks. Modifying factors include Nyquil and Dayquil which help for a while, but the relief did not last. Over course of the past 2 weeks the symptoms have been staying about the same. Denies any recent antibiotic use.    Allergies  Allergen Reactions  . Pantoprazole Sodium Itching    Current Outpatient Prescriptions on File Prior to Visit  Medication Sig Dispense Refill  . acetaminophen (TYLENOL ARTHRITIS PAIN) 650 MG CR tablet Take 650 mg by mouth every 8 (eight) hours as needed for pain.    Marland Kitchen amLODipine (NORVASC) 10 MG tablet TAKE 1 TABLET BY MOUTH EVERY DAY 90 tablet 3  . atorvastatin (LIPITOR) 40 MG tablet TAKE 1 TABLET BY MOUTH DAILY 90 tablet 3  . [facility] ALLERGY RELIEF 180 MG tablet TAKE 1 TABLET (180 MG TOTAL) BY MOUTH DAILY. 90 tablet 3  . flecainide (TAMBOCOR) 100 MG tablet TAKE 1 TABLET BY MOUTH TWICE A DAY 180 tablet 3  . fluticasone (FLONASE) 50 MCG/ACT nasal spray USE 2 SPRAYS IN EACH NOSTRIL ONCE A DAY 16 g 5  . latanoprost (XALATAN) 0.005 % ophthalmic solution Place 1 drop into both eyes daily.    Marland Kitchen lisinopril (PRINIVIL,ZESTRIL) 10 MG tablet TAKE 1 TABLET BY MOUTH EVERY MORNING 90 tablet 3  . omeprazole (PRILOSEC) 20 MG capsule TAKE ONE CAPSULE TWICE A DAY (Patient taking differently: TAKE ONE CAPSULE BY MOUTH TWICE A DAY) 60 capsule 4  . oxybutynin (DITROPAN-XL) 10 MG 24 hr tablet TAKE 1 TABLET (10 MG TOTAL) BY MOUTH DAILY. 90 tablet 3  . tamsulosin (FLOMAX) 0.4 MG CAPS capsule Take 0.4 mg by mouth twice per day 60 capsule 11  . traMADol (ULTRAM) 50 MG tablet TAKE 1 TABLET  EVERY 6 HOURS AS NEEDED FOR PAIN (Patient taking differently: TAKE 1 TABLET BY MOUTH EVERY 6 HOURS AS NEEDED FOR PAIN) 120 tablet 2  . XARELTO 20 MG TABS tablet TAKE 1 TABLET BY MOUTH EVERY DAY 90 tablet 3  . [DISCONTINUED] furosemide (LASIX) 40 MG tablet Take 1 tablet (40 mg total) by mouth daily. 30 tablet 11   No current facility-administered medications on file prior to visit.    Review of Systems  Constitutional: Negative for fever and chills.  HENT: Positive for congestion. Negative for sinus pressure and sore throat.   Respiratory: Positive for cough and chest tightness. Negative for shortness of breath.       Objective:    BP 140/82 mmHg  Pulse 64  Temp(Src) 98.1 F (36.7 C) (Oral)  Resp 18  Ht 5\' 9"  (1.753 m)  Wt 190 lb 12.8 oz (86.546 kg)  BMI 28.16 kg/m2  SpO2 98% Nursing note and vital signs reviewed.  Physical Exam  Constitutional: He is oriented to person, place, and time. He appears well-developed and well-nourished. No distress.  HENT:  Right Ear: Hearing, tympanic membrane, external ear and ear canal normal.  Left Ear: Hearing, tympanic membrane, external ear and ear canal normal.  Nose: Nose normal. Right sinus exhibits no maxillary sinus tenderness  and no frontal sinus tenderness. Left sinus exhibits no maxillary sinus tenderness and no frontal sinus tenderness.  Mouth/Throat: Uvula is midline, oropharynx is clear and moist and mucous membranes are normal.  Cardiovascular: Normal rate, regular rhythm, normal heart sounds and intact distal pulses.   Pulmonary/Chest: Effort normal and breath sounds normal.  Neurological: He is alert and oriented to person, place, and time.  Skin: Skin is warm and dry.  Psychiatric: He has a normal mood and affect. His behavior is normal. Judgment and thought content normal.       Assessment & Plan:

## 2015-01-21 NOTE — Assessment & Plan Note (Signed)
Symptoms and exam consistent with bronchitis. Given no improvement over last 2 weeks start Ceftin. Recommend over-the-counter Delsym or Robitussin with guaifenesin. Continue other over-the-counter medications as needed for symptom relief. Follow-up if symptoms worsen or fail to improve.

## 2015-01-21 NOTE — Patient Instructions (Addendum)
Thank you for choosing [facility].  Summary/Instructions:  Your prescription(s) have been submitted to your pharmacy or been printed and provided for you. Please take as directed and contact our office if you believe you are having problem(s) with the medication(s) or have any questions.  If your symptoms worsen or fail to improve, please contact our office for further instruction, or in case of emergency go directly to the emergency room at the closest medical facility.    Recommend Deylsm-DM or Robtussin-DM for cough  General Recommendations:    Please drink plenty of fluids.  Get plenty of rest   Sleep in humidified air  Use saline nasal sprays  Netti pot   OTC Medications:  Decongestants - helps relieve congestion   Flonase (generic fluticasone) or Nasacort (generic triamcinolone) - please make sure to use the "cross-over" technique at a 45 degree angle towards the opposite eye as opposed to straight up the nasal passageway.   If you have HIGH BLOOD PRESSURE - Coricidin HBP; AVOID any product that is -D as this contains pseudoephedrine which may increase your blood pressure.  Afrin (oxymetazoline) every 6-8 hours for up to 3 days.   Allergies - helps relieve runny nose, itchy eyes and sneezing   Claritin (generic loratidine), Allegra (fexofenidine), or Zyrtec (generic cyrterizine) for runny nose. These medications should not cause drowsiness.  Note - Benadryl (generic diphenhydramine) may be used however may cause drowsiness  Cough -   Delsym or Robitussin (generic dextromethorphan)  Expectorants - helps loosen mucus to ease removal   Mucinex (generic guaifenesin) as directed on the package.  Headaches / General Aches   Tylenol (generic acetaminophen) - DO NOT EXCEED 3 grams (3,000 mg) in a 24 hour time period  Advil/Motrin (generic ibuprofen)   Sore Throat -   Salt water gargle   Chloraseptic (generic benzocaine) spray or lozenges / Sucrets  (generic dyclonine)

## 2015-01-22 ENCOUNTER — Other Ambulatory Visit: Payer: Self-pay | Admitting: Internal Medicine

## 2015-02-14 ENCOUNTER — Other Ambulatory Visit: Payer: Self-pay | Admitting: Internal Medicine

## 2015-02-15 NOTE — Telephone Encounter (Signed)
Faxed script back to [facility].../lmb 

## 2015-02-15 NOTE — Telephone Encounter (Signed)
Done hardcopy to Cherina  

## 2015-04-17 ENCOUNTER — Other Ambulatory Visit: Payer: Self-pay | Admitting: Internal Medicine

## 2015-04-19 ENCOUNTER — Encounter: Payer: Self-pay | Admitting: *Deleted

## 2015-05-01 ENCOUNTER — Ambulatory Visit (INDEPENDENT_AMBULATORY_CARE_PROVIDER_SITE_OTHER): Payer: Medicare Other | Admitting: *Deleted

## 2015-05-01 ENCOUNTER — Telehealth: Payer: Self-pay | Admitting: *Deleted

## 2015-05-01 DIAGNOSIS — I480 Paroxysmal atrial fibrillation: Secondary | ICD-10-CM

## 2015-05-01 LAB — CUP PACEART INCLINIC DEVICE CHECK
Date Time Interrogation Session: 20160615040000
Lead Channel Impedance Value: 530 Ohm
Lead Channel Impedance Value: 600 Ohm
Lead Channel Pacing Threshold Amplitude: 0.8 V
Lead Channel Pacing Threshold Pulse Width: 0.4 ms
Lead Channel Pacing Threshold Pulse Width: 1 ms
Lead Channel Sensing Intrinsic Amplitude: 1.5 mV
Lead Channel Setting Pacing Amplitude: 2 V
Lead Channel Setting Pacing Pulse Width: 1 ms
MDC IDC MSMT LEADCHNL RV PACING THRESHOLD AMPLITUDE: 1.1 V
MDC IDC MSMT LEADCHNL RV SENSING INTR AMPL: 8.1 mV
MDC IDC PG SERIAL: 764370
MDC IDC SET LEADCHNL RV PACING AMPLITUDE: 2.4 V
MDC IDC SET LEADCHNL RV SENSING SENSITIVITY: 2.5 mV
MDC IDC STAT BRADY RA PERCENT PACED: 74 %
MDC IDC STAT BRADY RV PERCENT PACED: 5 %

## 2015-05-01 NOTE — Progress Notes (Signed)
Pacemaker check in clinic. Normal device function. Thresholds, sensing, impedances consistent with previous measurements. Device programmed to maximize longevity. 117 mode switches--total time 55.7 minutes. + Xarelto. No high ventricular rates noted. Device programmed at appropriate safety margins. Histogram distribution appropriate for patient activity level. Device programmed to optimize intrinsic conduction. Estimated longevity 4 years. ROV in November with JA.

## 2015-05-13 ENCOUNTER — Other Ambulatory Visit: Payer: Self-pay | Admitting: Physician Assistant

## 2015-05-22 ENCOUNTER — Encounter: Payer: Self-pay | Admitting: Internal Medicine

## 2015-05-23 ENCOUNTER — Encounter: Payer: Self-pay | Admitting: Internal Medicine

## 2015-05-27 NOTE — Telephone Encounter (Signed)
Encounter closed

## 2015-06-30 ENCOUNTER — Emergency Department (HOSPITAL_BASED_OUTPATIENT_CLINIC_OR_DEPARTMENT_OTHER): Payer: Medicare Other

## 2015-06-30 ENCOUNTER — Inpatient Hospital Stay (HOSPITAL_BASED_OUTPATIENT_CLINIC_OR_DEPARTMENT_OTHER)
Admission: EM | Admit: 2015-06-30 | Discharge: 2015-07-04 | DRG: 392 | Disposition: A | Discharge status: Home / Self Care | Payer: Medicare Other | Attending: Internal Medicine | Admitting: Internal Medicine

## 2015-06-30 ENCOUNTER — Encounter (HOSPITAL_BASED_OUTPATIENT_CLINIC_OR_DEPARTMENT_OTHER): Payer: Self-pay | Admitting: Emergency Medicine

## 2015-06-30 DIAGNOSIS — F1010 Alcohol abuse, uncomplicated: Secondary | ICD-10-CM | POA: Diagnosis present

## 2015-06-30 DIAGNOSIS — R7989 Other specified abnormal findings of blood chemistry: Secondary | ICD-10-CM

## 2015-06-30 DIAGNOSIS — R1110 Vomiting, unspecified: Secondary | ICD-10-CM

## 2015-06-30 DIAGNOSIS — J986 Disorders of diaphragm: Secondary | ICD-10-CM | POA: Diagnosis present

## 2015-06-30 DIAGNOSIS — M545 Low back pain, unspecified: Secondary | ICD-10-CM | POA: Diagnosis present

## 2015-06-30 DIAGNOSIS — Z888 Allergy status to other drugs, medicaments and biological substances status: Secondary | ICD-10-CM

## 2015-06-30 DIAGNOSIS — I10 Essential (primary) hypertension: Secondary | ICD-10-CM | POA: Diagnosis present

## 2015-06-30 DIAGNOSIS — I509 Heart failure, unspecified: Secondary | ICD-10-CM | POA: Diagnosis present

## 2015-06-30 DIAGNOSIS — D72829 Elevated white blood cell count, unspecified: Secondary | ICD-10-CM | POA: Diagnosis present

## 2015-06-30 DIAGNOSIS — R109 Unspecified abdominal pain: Secondary | ICD-10-CM | POA: Diagnosis not present

## 2015-06-30 DIAGNOSIS — E785 Hyperlipidemia, unspecified: Secondary | ICD-10-CM | POA: Diagnosis present

## 2015-06-30 DIAGNOSIS — I2510 Atherosclerotic heart disease of native coronary artery without angina pectoris: Secondary | ICD-10-CM | POA: Diagnosis present

## 2015-06-30 DIAGNOSIS — R112 Nausea with vomiting, unspecified: Secondary | ICD-10-CM | POA: Diagnosis present

## 2015-06-30 DIAGNOSIS — M1990 Unspecified osteoarthritis, unspecified site: Secondary | ICD-10-CM | POA: Diagnosis present

## 2015-06-30 DIAGNOSIS — Z7901 Long term (current) use of anticoagulants: Secondary | ICD-10-CM

## 2015-06-30 DIAGNOSIS — Z79899 Other long term (current) drug therapy: Secondary | ICD-10-CM

## 2015-06-30 DIAGNOSIS — I4891 Unspecified atrial fibrillation: Secondary | ICD-10-CM | POA: Diagnosis present

## 2015-06-30 DIAGNOSIS — F32A Depression, unspecified: Secondary | ICD-10-CM | POA: Diagnosis present

## 2015-06-30 DIAGNOSIS — I480 Paroxysmal atrial fibrillation: Secondary | ICD-10-CM | POA: Diagnosis present

## 2015-06-30 DIAGNOSIS — Z950 Presence of cardiac pacemaker: Secondary | ICD-10-CM

## 2015-06-30 DIAGNOSIS — F329 Major depressive disorder, single episode, unspecified: Secondary | ICD-10-CM | POA: Diagnosis present

## 2015-06-30 DIAGNOSIS — I8390 Asymptomatic varicose veins of unspecified lower extremity: Secondary | ICD-10-CM | POA: Diagnosis present

## 2015-06-30 DIAGNOSIS — M5412 Radiculopathy, cervical region: Secondary | ICD-10-CM | POA: Diagnosis present

## 2015-06-30 DIAGNOSIS — N400 Enlarged prostate without lower urinary tract symptoms: Secondary | ICD-10-CM | POA: Diagnosis present

## 2015-06-30 DIAGNOSIS — R509 Fever, unspecified: Secondary | ICD-10-CM | POA: Diagnosis present

## 2015-06-30 DIAGNOSIS — R945 Abnormal results of liver function studies: Secondary | ICD-10-CM

## 2015-06-30 DIAGNOSIS — K219 Gastro-esophageal reflux disease without esophagitis: Secondary | ICD-10-CM | POA: Diagnosis present

## 2015-06-30 DIAGNOSIS — Z87891 Personal history of nicotine dependence: Secondary | ICD-10-CM

## 2015-06-30 DIAGNOSIS — I495 Sick sinus syndrome: Secondary | ICD-10-CM | POA: Diagnosis present

## 2015-06-30 HISTORY — DX: Heart failure, unspecified: I50.9

## 2015-06-30 LAB — CBC WITH DIFFERENTIAL/PLATELET
Basophils Absolute: 0 10*3/uL (ref 0.0–0.1)
Basophils Relative: 0 % (ref 0–1)
Eosinophils Absolute: 0 10*3/uL (ref 0.0–0.7)
Eosinophils Relative: 0 % (ref 0–5)
HEMATOCRIT: 43 % (ref 39.0–52.0)
HEMOGLOBIN: 14.7 g/dL (ref 13.0–17.0)
LYMPHS ABS: 1.2 10*3/uL (ref 0.7–4.0)
LYMPHS PCT: 13 % (ref 12–46)
MCH: 30.2 pg (ref 26.0–34.0)
MCHC: 34.2 g/dL (ref 30.0–36.0)
MCV: 88.5 fL (ref 78.0–100.0)
MONO ABS: 0.6 10*3/uL (ref 0.1–1.0)
MONOS PCT: 6 % (ref 3–12)
Neutro Abs: 7.5 10*3/uL (ref 1.7–7.7)
Neutrophils Relative %: 81 % — ABNORMAL HIGH (ref 43–77)
PLATELETS: 196 10*3/uL (ref 150–400)
RBC: 4.86 MIL/uL (ref 4.22–5.81)
RDW: 13.1 % (ref 11.5–15.5)
WBC: 9.3 10*3/uL (ref 4.0–10.5)

## 2015-06-30 LAB — COMPREHENSIVE METABOLIC PANEL
ALT: 22 U/L (ref 17–63)
ANION GAP: 12 (ref 5–15)
AST: 32 U/L (ref 15–41)
Albumin: 4.6 g/dL (ref 3.5–5.0)
Alkaline Phosphatase: 61 U/L (ref 38–126)
BUN: 17 mg/dL (ref 6–20)
CALCIUM: 9.4 mg/dL (ref 8.9–10.3)
CO2: 19 mmol/L — ABNORMAL LOW (ref 22–32)
Chloride: 105 mmol/L (ref 101–111)
Creatinine, Ser: 1.1 mg/dL (ref 0.61–1.24)
GFR calc Af Amer: >60 mL/min (ref >60)
GFR calc non Af Amer: >60 mL/min (ref >60)
GLUCOSE: 123 mg/dL — AB (ref 65–99)
POTASSIUM: 4.1 mmol/L (ref 3.5–5.1)
Sodium: 136 mmol/L (ref 135–145)
Total Bilirubin: 0.9 mg/dL (ref 0.3–1.2)
Total Protein: 7.6 g/dL (ref 6.5–8.1)

## 2015-06-30 LAB — LIPASE, BLOOD: Lipase: 29 U/L (ref 22–51)

## 2015-06-30 MED ORDER — METOCLOPRAMIDE HCL 5 MG/ML IJ SOLN
10.0000 mg | Freq: Once | INTRAMUSCULAR | Status: AC
  Administered 2015-06-30: 10 mg via INTRAVENOUS
  Filled 2015-06-30: qty 2

## 2015-06-30 MED ORDER — SODIUM CHLORIDE 0.9 % IV SOLN
INTRAVENOUS | Status: AC
  Administered 2015-07-01: 01:00:00 via INTRAVENOUS

## 2015-06-30 MED ORDER — ONDANSETRON HCL 4 MG/2ML IJ SOLN: 4.0000 mg | Freq: Three times a day (TID) | INTRAMUSCULAR | Status: AC | PRN

## 2015-06-30 MED ORDER — SODIUM CHLORIDE 0.9 % IV BOLUS (SEPSIS)
500.0000 mL | Freq: Once | INTRAVENOUS | Status: AC
  Administered 2015-06-30: 500 mL via INTRAVENOUS

## 2015-06-30 MED ORDER — PROMETHAZINE HCL 25 MG/ML IJ SOLN
12.5000 mg | INTRAMUSCULAR | Status: DC | PRN
  Administered 2015-06-30: 12.5 mg via INTRAVENOUS
  Filled 2015-06-30: qty 1

## 2015-06-30 MED ORDER — ONDANSETRON HCL 4 MG/2ML IJ SOLN
4.0000 mg | Freq: Once | INTRAMUSCULAR | Status: AC
  Administered 2015-06-30: 4 mg via INTRAVENOUS
  Filled 2015-06-30: qty 2

## 2015-06-30 NOTE — ED Notes (Signed)
Last ate 1400. nv and abd discomfort/ upset onset at ~ 1530. Last BM yesterday (normal). Denies: fever, diarrhea, bleeding or other sx. Pt to xray.

## 2015-06-30 NOTE — ED Notes (Signed)
C/o abd pain and nausea. Up to b/r "in hopes to relieve discomfort", steady gait, denies dizziness.

## 2015-06-30 NOTE — ED Provider Notes (Signed)
CSN: [id number]     Arrival date & time 06/30/15  1911 History  This chart was scribed for Leonard Schwartz, MD by Rayna Sexton, ED scribe. This patient was seen in room MH01/MH01 and the patient's care was started at 7:36 PM.  Chief Complaint  Patient presents with  . Emesis   The history is provided by the patient. No language interpreter was used.    HPI Comments: [name] is a 74 y.o. male who presents to the Emergency Department complaining of constant, moderate, nausea and vomiting with onset at 3:30 PM. Pt notes feeling poor for the past few days and beginning this afternoon notes his current symptoms began. He notes associated appetite change and describes his current emesis as "phlem". Pt notes having a pacemaker in place and denies any shx of cholecystectomy or appendectomy. He denies any hematemesis or diarrhea.   Past Medical History  Diagnosis Date  . Hypertension   . Hyperlipidemia   . BPH (benign prostatic hyperplasia)   . Diverticulosis of colon (without mention of hemorrhage)   . Esophageal reflux   . Abdominal pain, epigastric   . Depressive disorder, not elsewhere classified   . Lumbago   . Allergic rhinitis, cause unspecified   . 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 Persistent atrial fibrillation   . Chronic anticoagulation     xarelto  . High risk medication use   . Paralyzed hemidiaphragm     LEFT  . Coronary artery calcification seen on CAT scan 12/26/2012  . Arthritis   . Shortness of breath     sob with exertion  . Pacemaker   . Hyperplastic rectal polyp    Past Surgical History  Procedure Laterality Date  . Insert / replace / remove pacemaker  05/2009     [facility] scientific ALTRUA 60  (serial number [id number]) dual-chamber pacemaker  . Pacemaker placement Bilateral     '05  . Esophagogastroduodenoscopy N/A 12/05/2013    Procedure: ESOPHAGOGASTRODUODENOSCOPY (EGD);  Surgeon: [name], MD;  Location: [facility];   Service: Endoscopy;  Laterality: N/A;   Family History  Problem Relation Age of Onset  . Hypertension Father   . Lung cancer Father   . Cancer Father   . Asthma Sister   . Hypertension Sister   . Arthritis Paternal Grandmother   . Diabetes Mother   . Hypertension Mother   . Hypertension Brother   . Hypertension Daughter   . Hypertension Son    Social History  Substance Use Topics  . Smoking status: Former Smoker -- 0.50 packs/day for 25 years    Types: Cigarettes    Quit date: 11/16/1978  . Smokeless tobacco: Former Systems developer    Types: Chew  . Alcohol Use: No    Review of Systems  Constitutional: Positive for appetite change and fatigue.  Gastrointestinal: Positive for nausea and vomiting. Negative for diarrhea.  Neurological: Positive for weakness.   Allergies  Pantoprazole sodium  Home Medications   Prior to Admission medications   Medication Sig Start Date End Date Taking? Authorizing Provider  acetaminophen (TYLENOL ARTHRITIS PAIN) 650 MG CR tablet Take 650 mg by mouth every 8 (eight) hours as needed for pain.    Historical Provider, MD  amLODipine (NORVASC) 10 MG tablet TAKE 1 TABLET BY MOUTH EVERY DAY 12/20/14   Biagio Borg, MD  atorvastatin (LIPITOR) 40 MG tablet TAKE 1 TABLET BY MOUTH DAILY 11/06/14   Jeneen Rinks  Quin Hoop, MD  cefUROXime (CEFTIN) 250 MG tablet Take 1 tablet (250 mg total) by mouth 2 (two) times daily with a meal. 01/21/15   Golden Circle, FNP  [facility] ALLERGY RELIEF 180 MG tablet TAKE 1 TABLET (180 MG TOTAL) BY MOUTH DAILY. 06/19/14   Biagio Borg, MD  flecainide (TAMBOCOR) 100 MG tablet TAKE 1 TABLET BY MOUTH TWICE A DAY 12/20/14   Biagio Borg, MD  fluticasone [facility]) 50 MCG/ACT nasal spray USE 2 SPRAYS IN [facility] NOSTRIL ONCE A DAY 06/29/14   Biagio Borg, MD  latanoprost (XALATAN) 0.005 % ophthalmic solution Place 1 drop into both eyes daily.    Historical Provider, MD  lisinopril (PRINIVIL,ZESTRIL) 10 MG tablet TAKE 1 TABLET BY MOUTH EVERY MORNING 12/06/14   Biagio Borg, MD  omeprazole (PRILOSEC) 20 MG capsule TAKE ONE CAPSULE TWICE A DAY Patient taking differently: TAKE ONE CAPSULE BY MOUTH TWICE A DAY 07/24/14   Amy S Esterwood, PA-C  omeprazole (PRILOSEC) 20 MG capsule TAKE ONE CAPSULE BY MOUTH TWICE A DAY 05/13/15   [name], MD  oxybutynin (DITROPAN-XL) 10 MG 24 hr tablet TAKE 1 TABLET (10 MG TOTAL) BY MOUTH DAILY. 06/20/14   Biagio Borg, MD  tamsulosin (FLOMAX) 0.4 MG CAPS capsule TAKE ONE CAPSULE BY MOUTH TWICE A DAY 01/22/15   Biagio Borg, MD  traMADol (ULTRAM) 50 MG tablet TAKE 1 TABLET EVERY 6 HOURS AS NEEDED FOR PAIN 02/15/15   Biagio Borg, MD  XARELTO 20 MG TABS tablet TAKE 1 TABLET BY MOUTH EVERY DAY 04/18/15   Biagio Borg, MD   BP 111/67 mmHg  Pulse 70  Temp(Src) 98.2 F (36.8 C) (Oral)  Resp 16  Ht 5\' 9"  (1.753 m)  Wt 180 lb (81.647 kg)  BMI 26.57 kg/m2  SpO2 100% Physical Exam Physical Exam  Nursing note and vitals reviewed. Constitutional: He is oriented to person, place, and time. He appears well-developed and well-nourished. No distress.  HENT:  Head: Normocephalic and atraumatic.  Eyes: Pupils are equal, round, and reactive to light.  Neck: Normal range of motion.  Cardiovascular: Normal rate and intact distal pulses.   Pulmonary/Chest: No respiratory distress.  Abdominal: Normal appearance. He exhibits no distension.  Active bowel sounds with no tenderness to palpation.  No guarding.  No rebound.  No peritoneal signs.   Musculoskeletal: Normal range of motion.  Neurological: He is alert and oriented to person, place, and time. No cranial nerve deficit.  Skin: Skin is warm and dry. No rash noted.  Psychiatric: He has a normal mood and affect. His behavior is normal.   ED Course  Procedures  Medications  sodium chloride 0.9 % bolus 500 mL (0 mLs Intravenous Stopped 06/30/15 2029)  ondansetron (ZOFRAN) injection 4 mg (4 mg Intravenous Given 06/30/15 2001)    DIAGNOSTIC STUDIES: Oxygen Saturation is 100% on RA, normal by  my interpretation.    COORDINATION OF CARE: 7:39 PM Discussed treatment plan with pt at bedside and pt agreed to plan.  Labs Review Labs Reviewed  CBC WITH DIFFERENTIAL/PLATELET - Abnormal; Notable for the following:    Neutrophils Relative % 81 (*)    All other components within normal limits  COMPREHENSIVE METABOLIC PANEL - Abnormal; Notable for the following:    CO2 19 (*)    Glucose, Bld 123 (*)    All other components within normal limits  LIPASE, BLOOD    Imaging Review Dg Abd Acute W/chest  06/30/2015  CLINICAL DATA:  Nausea and vomiting  EXAM: DG ABDOMEN ACUTE W/ 1V CHEST  COMPARISON:  11/17/2013.  FINDINGS: Cardiac shadow is within normal limits. A pacing device is seen. The lungs are clear bilaterally. Mild elevation of left hemidiaphragm is seen.  Scattered large and small bowel gas is noted. No obstructive changes are seen. Degenerative changes of lumbar spine are noted. No free air is seen. Multiple mesenteric calcifications are again identified throughout the abdomen.  IMPRESSION: No acute abnormality noted.   Electronically Signed   By: Inez Catalina M.D.   On: 06/30/2015 20:35   I, Leonard Schwartz, MD, personally reviewed and evaluated these images and lab results as part of my medical decision-making.    MDM   Final diagnoses:  None      I personally performed the services described in this documentation, which was scribed in my presence. The recorded information has been reviewed and considered.   Leonard Schwartz, MD 06/30/15 [phone]

## 2015-06-30 NOTE — ED Notes (Signed)
patient has been sick since last pm - vomiting in triage.

## 2015-06-30 NOTE — ED Notes (Signed)
Back from xray, VSS, "feel about the same", no active vomiting, family at [facility].

## 2015-06-30 NOTE — ED Notes (Signed)
Attempted report 

## 2015-06-30 NOTE — ED Notes (Signed)
[facility] here for pt, pt remains resting, alert, NAD, calm, interactive, VSS, no dyspnea noted, "feel better". 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

## 2015-07-01 ENCOUNTER — Inpatient Hospital Stay (HOSPITAL_COMMUNITY): Payer: Medicare Other

## 2015-07-01 ENCOUNTER — Encounter (HOSPITAL_COMMUNITY): Payer: Self-pay | Admitting: Radiology

## 2015-07-01 DIAGNOSIS — R7989 Other specified abnormal findings of blood chemistry: Secondary | ICD-10-CM | POA: Diagnosis not present

## 2015-07-01 DIAGNOSIS — R1084 Generalized abdominal pain: Secondary | ICD-10-CM | POA: Diagnosis not present

## 2015-07-01 DIAGNOSIS — Z888 Allergy status to other drugs, medicaments and biological substances status: Secondary | ICD-10-CM | POA: Diagnosis not present

## 2015-07-01 DIAGNOSIS — I480 Paroxysmal atrial fibrillation: Secondary | ICD-10-CM | POA: Diagnosis not present

## 2015-07-01 DIAGNOSIS — R1010 Upper abdominal pain, unspecified: Secondary | ICD-10-CM | POA: Diagnosis not present

## 2015-07-01 DIAGNOSIS — I509 Heart failure, unspecified: Secondary | ICD-10-CM | POA: Diagnosis present

## 2015-07-01 DIAGNOSIS — R1110 Vomiting, unspecified: Secondary | ICD-10-CM | POA: Diagnosis present

## 2015-07-01 DIAGNOSIS — I495 Sick sinus syndrome: Secondary | ICD-10-CM

## 2015-07-01 DIAGNOSIS — R1013 Epigastric pain: Secondary | ICD-10-CM | POA: Diagnosis not present

## 2015-07-01 DIAGNOSIS — M545 Low back pain: Secondary | ICD-10-CM | POA: Diagnosis present

## 2015-07-01 DIAGNOSIS — I8390 Asymptomatic varicose veins of unspecified lower extremity: Secondary | ICD-10-CM | POA: Diagnosis present

## 2015-07-01 DIAGNOSIS — N400 Enlarged prostate without lower urinary tract symptoms: Secondary | ICD-10-CM

## 2015-07-01 DIAGNOSIS — Z950 Presence of cardiac pacemaker: Secondary | ICD-10-CM | POA: Diagnosis not present

## 2015-07-01 DIAGNOSIS — D72829 Elevated white blood cell count, unspecified: Secondary | ICD-10-CM | POA: Diagnosis present

## 2015-07-01 DIAGNOSIS — I2510 Atherosclerotic heart disease of native coronary artery without angina pectoris: Secondary | ICD-10-CM | POA: Diagnosis present

## 2015-07-01 DIAGNOSIS — Z87891 Personal history of nicotine dependence: Secondary | ICD-10-CM | POA: Diagnosis not present

## 2015-07-01 DIAGNOSIS — F329 Major depressive disorder, single episode, unspecified: Secondary | ICD-10-CM | POA: Diagnosis not present

## 2015-07-01 DIAGNOSIS — R112 Nausea with vomiting, unspecified: Secondary | ICD-10-CM | POA: Diagnosis not present

## 2015-07-01 DIAGNOSIS — I10 Essential (primary) hypertension: Secondary | ICD-10-CM | POA: Diagnosis not present

## 2015-07-01 DIAGNOSIS — M1990 Unspecified osteoarthritis, unspecified site: Secondary | ICD-10-CM | POA: Diagnosis present

## 2015-07-01 DIAGNOSIS — F1010 Alcohol abuse, uncomplicated: Secondary | ICD-10-CM | POA: Diagnosis present

## 2015-07-01 DIAGNOSIS — Z79899 Other long term (current) drug therapy: Secondary | ICD-10-CM | POA: Diagnosis not present

## 2015-07-01 DIAGNOSIS — G43A0 Cyclical vomiting, not intractable: Secondary | ICD-10-CM

## 2015-07-01 DIAGNOSIS — J986 Disorders of diaphragm: Secondary | ICD-10-CM | POA: Diagnosis present

## 2015-07-01 DIAGNOSIS — R109 Unspecified abdominal pain: Secondary | ICD-10-CM | POA: Diagnosis present

## 2015-07-01 DIAGNOSIS — E785 Hyperlipidemia, unspecified: Secondary | ICD-10-CM | POA: Diagnosis not present

## 2015-07-01 DIAGNOSIS — N200 Calculus of kidney: Secondary | ICD-10-CM | POA: Diagnosis not present

## 2015-07-01 DIAGNOSIS — Z7901 Long term (current) use of anticoagulants: Secondary | ICD-10-CM | POA: Diagnosis not present

## 2015-07-01 DIAGNOSIS — R509 Fever, unspecified: Secondary | ICD-10-CM | POA: Diagnosis present

## 2015-07-01 DIAGNOSIS — K219 Gastro-esophageal reflux disease without esophagitis: Secondary | ICD-10-CM | POA: Diagnosis present

## 2015-07-01 DIAGNOSIS — M5412 Radiculopathy, cervical region: Secondary | ICD-10-CM | POA: Diagnosis present

## 2015-07-01 LAB — TROPONIN I

## 2015-07-01 LAB — RAPID URINE DRUG SCREEN, HOSP PERFORMED
Amphetamines: NOT DETECTED
BARBITURATES: NOT DETECTED
Benzodiazepines: NOT DETECTED
Cocaine: NOT DETECTED
Opiates: NOT DETECTED
TETRAHYDROCANNABINOL: POSITIVE — AB

## 2015-07-01 LAB — URINALYSIS, ROUTINE W REFLEX MICROSCOPIC
BILIRUBIN URINE: NEGATIVE
Glucose, UA: NEGATIVE mg/dL
HGB URINE DIPSTICK: NEGATIVE
KETONES UR: NEGATIVE mg/dL
Leukocytes, UA: NEGATIVE
NITRITE: NEGATIVE
PROTEIN: NEGATIVE mg/dL
Specific Gravity, Urine: 1.016 (ref 1.005–1.030)
UROBILINOGEN UA: 1 mg/dL (ref 0.0–1.0)
pH: 6.5 (ref 5.0–8.0)

## 2015-07-01 LAB — LACTIC ACID, PLASMA
LACTIC ACID, VENOUS: 1.2 mmol/L (ref 0.5–2.0)
Lactic Acid, Venous: 1.1 mmol/L (ref 0.5–2.0)

## 2015-07-01 LAB — PROTIME-INR
INR: 1.45 (ref 0.00–1.49)
PROTHROMBIN TIME: 17.7 s — AB (ref 11.6–15.2)

## 2015-07-01 LAB — APTT: aPTT: 34 seconds (ref 24–37)

## 2015-07-01 LAB — PROCALCITONIN

## 2015-07-01 LAB — GLUCOSE, CAPILLARY: GLUCOSE-CAPILLARY: 112 mg/dL — AB (ref 65–99)

## 2015-07-01 MED ORDER — PANTOPRAZOLE SODIUM 40 MG PO TBEC: 40.0000 mg | DELAYED_RELEASE_TABLET | Freq: Every day | ORAL | Status: DC

## 2015-07-01 MED ORDER — FAMOTIDINE IN NACL 20-0.9 MG/50ML-% IV SOLN
20.0000 mg | Freq: Two times a day (BID) | INTRAVENOUS | Status: DC
  Administered 2015-07-01 (×2): 20 mg via INTRAVENOUS
  Filled 2015-07-01 (×4): qty 50

## 2015-07-01 MED ORDER — ATORVASTATIN CALCIUM 40 MG PO TABS
40.0000 mg | ORAL_TABLET | Freq: Every day | ORAL | Status: DC
  Administered 2015-07-01 – 2015-07-04 (×4): 40 mg via ORAL
  Filled 2015-07-01 (×4): qty 1

## 2015-07-01 MED ORDER — AMLODIPINE BESYLATE 10 MG PO TABS
10.0000 mg | ORAL_TABLET | Freq: Every day | ORAL | Status: DC
  Administered 2015-07-01 – 2015-07-03 (×3): 10 mg via ORAL
  Filled 2015-07-01 (×3): qty 1

## 2015-07-01 MED ORDER — RIVAROXABAN 20 MG PO TABS
20.0000 mg | ORAL_TABLET | Freq: Every day | ORAL | Status: DC
  Administered 2015-07-01 – 2015-07-03 (×3): 20 mg via ORAL
  Filled 2015-07-01 (×3): qty 1

## 2015-07-01 MED ORDER — ACETAMINOPHEN 325 MG PO TABS
650.0000 mg | ORAL_TABLET | Freq: Three times a day (TID) | ORAL | Status: DC | PRN
Start: 2015-07-01 — End: 2015-07-04
  Administered 2015-07-02: 650 mg via ORAL
  Filled 2015-07-01: qty 2

## 2015-07-01 MED ORDER — RIVAROXABAN 20 MG PO TABS: 20.0000 mg | ORAL_TABLET | Freq: Every day | ORAL | Status: DC

## 2015-07-01 MED ORDER — SODIUM CHLORIDE 0.9 % IJ SOLN
3.0000 mL | Freq: Two times a day (BID) | INTRAMUSCULAR | Status: DC
Start: 2015-07-01 — End: 2015-07-04
  Administered 2015-07-01 – 2015-07-02 (×3): 3 mL via INTRAVENOUS

## 2015-07-01 MED ORDER — HYDRALAZINE HCL 20 MG/ML IJ SOLN: 5.0000 mg | INTRAMUSCULAR | Status: DC | PRN

## 2015-07-01 MED ORDER — SODIUM CHLORIDE 0.9 % IV BOLUS (SEPSIS)
1000.0000 mL | Freq: Once | INTRAVENOUS | Status: AC
  Administered 2015-07-01: 1000 mL via INTRAVENOUS

## 2015-07-01 MED ORDER — OMEPRAZOLE 20 MG PO CPDR
20.0000 mg | DELAYED_RELEASE_CAPSULE | Freq: Two times a day (BID) | ORAL | Status: DC
  Administered 2015-07-01 – 2015-07-02 (×2): 20 mg via ORAL
  Filled 2015-07-01 (×4): qty 1

## 2015-07-01 MED ORDER — TAMSULOSIN HCL 0.4 MG PO CAPS
0.4000 mg | ORAL_CAPSULE | Freq: Two times a day (BID) | ORAL | Status: DC
  Administered 2015-07-01 – 2015-07-04 (×7): 0.4 mg via ORAL
  Filled 2015-07-01 (×7): qty 1

## 2015-07-01 MED ORDER — FLECAINIDE ACETATE 100 MG PO TABS
100.0000 mg | ORAL_TABLET | Freq: Two times a day (BID) | ORAL | Status: DC
  Administered 2015-07-01 – 2015-07-04 (×7): 100 mg via ORAL
  Filled 2015-07-01 (×10): qty 1

## 2015-07-01 MED ORDER — LATANOPROST 0.005 % OP SOLN
1.0000 [drp] | Freq: Every day | OPHTHALMIC | Status: DC
  Administered 2015-07-01 – 2015-07-04 (×4): 1 [drp] via OPHTHALMIC
  Filled 2015-07-01: qty 2.5

## 2015-07-01 MED ORDER — FLUTICASONE PROPIONATE 50 MCG/ACT NA SUSP
2.0000 | Freq: Every day | NASAL | Status: DC
  Administered 2015-07-01 – 2015-07-04 (×4): 2 via NASAL
  Filled 2015-07-01: qty 16

## 2015-07-01 MED ORDER — ACETAMINOPHEN ER 650 MG PO TBCR: 650.0000 mg | EXTENDED_RELEASE_TABLET | Freq: Three times a day (TID) | ORAL | Status: DC | PRN

## 2015-07-01 MED ORDER — IOHEXOL 300 MG/ML  SOLN
100.0000 mL | Freq: Once | INTRAMUSCULAR | Status: AC | PRN
  Administered 2015-07-01: 100 mL via INTRAVENOUS

## 2015-07-01 MED ORDER — PANTOPRAZOLE SODIUM 40 MG PO TBEC: 40.0000 mg | DELAYED_RELEASE_TABLET | Freq: Two times a day (BID) | ORAL | Status: DC

## 2015-07-01 MED ORDER — OXYBUTYNIN CHLORIDE ER 10 MG PO TB24
10.0000 mg | ORAL_TABLET | Freq: Every day | ORAL | Status: DC
  Administered 2015-07-01 – 2015-07-03 (×3): 10 mg via ORAL
  Filled 2015-07-01 (×5): qty 1

## 2015-07-01 MED ORDER — LISINOPRIL 10 MG PO TABS
10.0000 mg | ORAL_TABLET | Freq: Every morning | ORAL | Status: DC
  Administered 2015-07-01 – 2015-07-02 (×2): 10 mg via ORAL
  Filled 2015-07-01 (×2): qty 1

## 2015-07-01 MED ORDER — MORPHINE SULFATE 2 MG/ML IJ SOLN
2.0000 mg | INTRAMUSCULAR | Status: DC | PRN
  Administered 2015-07-01 – 2015-07-03 (×2): 2 mg via INTRAVENOUS
  Filled 2015-07-01 (×2): qty 1

## 2015-07-01 MED ORDER — SODIUM CHLORIDE 0.9 % IV SOLN
INTRAVENOUS | Status: AC
  Administered 2015-07-01: 15:00:00 via INTRAVENOUS

## 2015-07-01 MED ORDER — IOHEXOL 300 MG/ML  SOLN
25.0000 mL | INTRAMUSCULAR | Status: AC
  Administered 2015-07-01 (×2): 25 mL via ORAL

## 2015-07-01 MED ORDER — OMEPRAZOLE 20 MG PO CPDR
20.0000 mg | DELAYED_RELEASE_CAPSULE | Freq: Two times a day (BID) | ORAL | Status: DC
  Filled 2015-07-01: qty 1

## 2015-07-01 NOTE — Progress Notes (Signed)
Since 74 year old male has history of hypertension, dyslipidemia, sick sinus syndrome status post pacemaker, paroxysmal A. fib on Xarelto, no history of CAD per cardiology notes in 2014, presented to the ER last night with nausea vomiting,and abdominal discomfort. Admitted this a.m. by Dr.NIu, labs are unremarkable LFTs lipase within normal limits UA pending, CT abdomen pelvis from earlier this morning with contrast is unremarkable, he has some symptoms of heartburn, will start IV PPI, trial of clears, supportive care, also check troponins 2 sets, advance diet as tolerated, further management his condition evolves. Will follow-up UA and urine culture  Domenic Polite, MD [phone]

## 2015-07-01 NOTE — H&P (Signed)
[facility] History and Physical  [name] VFI:[id number] DOB: 08/02/1941 DOA: 06/30/2015  Referring physician: ED physician PCP: Cathlean Cower, MD  Specialists:   Chief Complaint: Nausea, vomiting, abdominal pain  HPI: [name] is a 74 y.o. male with PMH of hypertension, hyperlipidemia, GERD, sick sinus syndrome, pacemaker placement, BPH, chronic back pain, PAF on Xarelto, paralysis of hemidiaphragm, CAD, who presents with nausea, vomiting and abdominal pain.  Patient reports that he started having nausea, vomiting and diffused abdominal pain at 3:30 PM yesterday when he was in church. He reports that he has not been feeling well since last night. His abdominal pain is severe, constant, nonradiating. It is not aggravated or alleviated by any known factors. He vomited more than 20 times, no blood in the vomitus per patient. He does not have diarrhea, fever or chills. He had a tiny amount of bowel movement in ED. He does not have chest pain, cough, shortness breath, symptoms of UTI, unilateral weakness.  In ED, patient was found to have the VAC 9.3, temperature 99.6, no tachycardia, electrolytes okay, lipase 29. X-ray of acute abdomen/pelvis is negative. Patient is admitted to inpatient for further evaluation and treatment.  Where does patient live?   At home Can patient participate in ADLs?  Yes     Review of Systems:   General: no fevers, chills, no changes in body weight, has poor appetite, has fatigue HEENT: no blurry vision, hearing changes or sore throat Pulm: no dyspnea, coughing, wheezing CV: no chest pain, palpitations Abd: has nausea, vomiting, abdominal pain, no diarrhea, constipation GU: no dysuria, burning on urination, increased urinary frequency, hematuria  Ext: no leg edema Neuro: no unilateral weakness, numbness, or tingling, no vision change or hearing loss Skin: no rash MSK: No muscle spasm, no deformity, no limitation of range of movement in spin Heme: No easy  bruising.  Travel history: No recent long distant travel.  Allergy:  Allergies  Allergen Reactions  . Pantoprazole Sodium Itching    Past Medical History  Diagnosis Date  . Hypertension   . Hyperlipidemia   . BPH (benign prostatic hyperplasia)   . Diverticulosis of colon (without mention of hemorrhage)   . Esophageal reflux   . Abdominal pain, epigastric   . Depressive disorder, not elsewhere classified   . Lumbago   . Allergic rhinitis, cause unspecified   . 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 Persistent atrial fibrillation   . Chronic anticoagulation     xarelto  . High risk medication use   . Paralyzed hemidiaphragm     LEFT  . Coronary artery calcification seen on CAT scan 12/26/2012  . Arthritis   . Shortness of breath     sob with exertion  . Pacemaker   . Hyperplastic rectal polyp     Past Surgical History  Procedure Laterality Date  . Insert / replace / remove pacemaker  05/2009     [facility] scientific ALTRUA 60  (serial number [id number]) dual-chamber pacemaker  . Pacemaker placement Bilateral     '05  . Esophagogastroduodenoscopy N/A 12/05/2013    Procedure: ESOPHAGOGASTRODUODENOSCOPY (EGD);  Surgeon: [name], MD;  Location: [facility];  Service: Endoscopy;  Laterality: N/A;    Social History:  reports that he quit smoking about 36 years ago. His smoking use included Cigarettes. He has a 12.5 pack-year smoking history. He has quit using smokeless tobacco. His smokeless tobacco use included Chew. He reports  that he does not drink alcohol or use illicit drugs.  Family History:  Family History  Problem Relation Age of Onset  . Hypertension Father   . Lung cancer Father   . Cancer Father   . Asthma Sister   . Hypertension Sister   . Arthritis Paternal Grandmother   . Diabetes Mother   . Hypertension Mother   . Hypertension Brother   . Hypertension Daughter   . Hypertension Son      Prior to Admission medications    Medication Sig Start Date End Date Taking? Authorizing Provider  acetaminophen (TYLENOL ARTHRITIS PAIN) 650 MG CR tablet Take 650 mg by mouth every 8 (eight) hours as needed for pain.    Historical Provider, MD  amLODipine (NORVASC) 10 MG tablet TAKE 1 TABLET BY MOUTH EVERY DAY 12/20/14   Biagio Borg, MD  atorvastatin (LIPITOR) 40 MG tablet TAKE 1 TABLET BY MOUTH DAILY 11/06/14   Biagio Borg, MD  cefUROXime (CEFTIN) 250 MG tablet Take 1 tablet (250 mg total) by mouth 2 (two) times daily with a meal. 01/21/15   Golden Circle, FNP  [facility] ALLERGY RELIEF 180 MG tablet TAKE 1 TABLET (180 MG TOTAL) BY MOUTH DAILY. 06/19/14   Biagio Borg, MD  flecainide (TAMBOCOR) 100 MG tablet TAKE 1 TABLET BY MOUTH TWICE A DAY 12/20/14   Biagio Borg, MD  fluticasone [facility]) 50 MCG/ACT nasal spray USE 2 SPRAYS IN [facility] NOSTRIL ONCE A DAY 06/29/14   Biagio Borg, MD  latanoprost (XALATAN) 0.005 % ophthalmic solution Place 1 drop into both eyes daily.    Historical Provider, MD  lisinopril (PRINIVIL,ZESTRIL) 10 MG tablet TAKE 1 TABLET BY MOUTH EVERY MORNING 12/06/14   Biagio Borg, MD  omeprazole (PRILOSEC) 20 MG capsule TAKE ONE CAPSULE TWICE A DAY Patient taking differently: TAKE ONE CAPSULE BY MOUTH TWICE A DAY 07/24/14   Amy S Esterwood, PA-C  omeprazole (PRILOSEC) 20 MG capsule TAKE ONE CAPSULE BY MOUTH TWICE A DAY 05/13/15   [name], MD  oxybutynin (DITROPAN-XL) 10 MG 24 hr tablet TAKE 1 TABLET (10 MG TOTAL) BY MOUTH DAILY. 06/20/14   Biagio Borg, MD  tamsulosin (FLOMAX) 0.4 MG CAPS capsule TAKE ONE CAPSULE BY MOUTH TWICE A DAY 01/22/15   Biagio Borg, MD  traMADol (ULTRAM) 50 MG tablet TAKE 1 TABLET EVERY 6 HOURS AS NEEDED FOR PAIN 02/15/15   Biagio Borg, MD  XARELTO 20 MG TABS tablet TAKE 1 TABLET BY MOUTH EVERY DAY 04/18/15   Biagio Borg, MD    Physical Exam: Filed Vitals:   06/30/15 2244 06/30/15 2355 07/01/15 0035 07/01/15 0421  BP:   137/68 127/76  Pulse: 75  83 68  Temp:   100 F (37.8 C) 99.8 F (37.7 C)   TempSrc:   Oral Oral  Resp:   17 16  Height:  5\' 9"  (1.753 m)    Weight:  81.375 kg (179 lb 6.4 oz)    SpO2: 100%  97% 100%   General: Not in acute distress HEENT:       Eyes: PERRL, EOMI, no scleral icterus.       ENT: No discharge from the ears and nose, no pharynx injection, no tonsillar enlargement.        Neck: No JVD, no bruit, no mass felt. Heme: No neck lymph node enlargement. Cardiac: S1/S2, RRR, No murmurs, No gallops or rubs. Pulm: No rales, wheezing, rhonchi or rubs. Abd: Soft,  nondistended, tender diffusely, no rebound pain, no organomegaly, BS present. Ext: No pitting leg edema bilaterally. 2+DP/PT pulse bilaterally. Musculoskeletal: No joint deformities, No joint redness or warmth, no limitation of ROM in spin. Skin: No rashes.  Neuro: Alert, oriented X3, cranial nerves II-XII grossly intact, muscle strength 5/5 in all extremities, sensation to light touch intact.  Psych: Patient is not psychotic, no suicidal or hemocidal ideation.  Labs on Admission:  Basic Metabolic Panel:  Recent Labs Lab 06/30/15 1948  NA 136  K 4.1  CL 105  CO2 19*  GLUCOSE 123*  BUN 17  CREATININE 1.10  CALCIUM 9.4   Liver Function Tests:  Recent Labs Lab 06/30/15 1948  AST 32  ALT 22  ALKPHOS 61  BILITOT 0.9  PROT 7.6  ALBUMIN 4.6    Recent Labs Lab 06/30/15 1948  LIPASE 29   No results for input(s): AMMONIA in the last 168 hours. CBC:  Recent Labs Lab 06/30/15 1948  WBC 9.3  NEUTROABS 7.5  HGB 14.7  HCT 43.0  MCV 88.5  PLT 196   Cardiac Enzymes: No results for input(s): CKTOTAL, CKMB, CKMBINDEX, TROPONINI in the last 168 hours.  BNP (last 3 results) No results for input(s): BNP in the last 8760 hours.  ProBNP (last 3 results) No results for input(s): PROBNP in the last 8760 hours.  CBG: No results for input(s): GLUCAP in the last 168 hours.  Radiological Exams on Admission: Dg Abd Acute W/chest  06/30/2015   CLINICAL DATA:  Nausea and vomiting   EXAM: DG ABDOMEN ACUTE W/ 1V CHEST  COMPARISON:  11/17/2013.  FINDINGS: Cardiac shadow is within normal limits. A pacing device is seen. The lungs are clear bilaterally. Mild elevation of left hemidiaphragm is seen.  Scattered large and small bowel gas is noted. No obstructive changes are seen. Degenerative changes of lumbar spine are noted. No free air is seen. Multiple mesenteric calcifications are again identified throughout the abdomen.  IMPRESSION: No acute abnormality noted.   Electronically Signed   By: Inez Catalina M.D.   On: 06/30/2015 20:35    EKG:  Not done in ED, will get one.   Assessment/Plan Principal Problem:   Abdominal pain Active Problems:   Sick sinus syndrome   Hyperlipidemia   Depression   Essential hypertension   Atrial fibrillation   BPH (benign prostatic hyperplasia)   Cervical radiculopathy   Varicose vein of leg   Coronary artery calcification seen on CAT scan   Lumbar back pain   Nausea and vomiting   Alcohol abuse   Abdominal pain: Etiology is not clear. Lipase negative. X-ray of acute abdomen/pelvis is negative. Liver functions okay. Patient is not septic on admission. Hemodynamically stable. -Will admit to tele bed -When necessary Zofran for nausea, morphine for pain -CT-abdomen/pelvis -will get Procalcitonin and trend lactic acid levels -IVF: 1.5 L of NS bolus in ED, followed by 100cc/h  Sick sinus syndrome: -has Psychologist, forensic.  -Flecainide -check ekg  HLD: Last LDL was 104 on 01/01/15. -Continue home medications: Lipitor  Atrial Fibrillation: CHA2DS2-VASc Score is 3, needs oral anticoagulation. Patient is on Xarelto at home. Heart rate is well controlled. -continue Xarelto and metoprolol -tele bed mornitoring   HTN: -Continue amlodipine, lisinopril, -IV hydralazine when necessary  BPH: stable - Continue Flomax and xoyubuynin  GERD: -pepcid   DVT ppx: Xarelto Code Status: Full code Family Communication: None at bed side.    Disposition Plan: Admit to inpatient   Date of Service 07/01/2015  Ivor Costa [facility] Pager [phone]  If 7PM-7AM, please contact night-coverage [email or web address] Password [facility] 07/01/2015, 4:51 AM

## 2015-07-01 NOTE — Evaluation (Signed)
Physical Therapy Evaluation/Discharge Patient Details Name: [name] MRN: [id number] DOB: 1941-10-12 Today's Date: 07/01/2015   History of Present Illness  74 y.o. male admitted to [facility] on 06/30/15 for N/V, abdominal pain.  Medical workup pending.  Pt with significant PMHx of HTN, diverticulosis of colon, lumbago, sick sinus syndrome, paralyzed hemidiaphram, SOB with exertion, pacemaker, and CHF.    Clinical Impression  Pt is independent with mobility and balance.  He does report some generalized weakness, but relays he has not been able to eat much since Sunday.  He has no acute PT needs and no f/u PT needs at this time.  PT to sign off.     Follow Up Recommendations No PT follow up    Equipment Recommendations  None recommended by PT    Recommendations for Other Services   NA    Precautions / Restrictions Precautions Precautions: None      Mobility  Bed Mobility Overal bed mobility: Independent                Transfers Overall transfer level: Independent                  Ambulation/Gait Ambulation/Gait assistance: Independent Ambulation Distance (Feet): 350 Feet Assistive device: None Gait Pattern/deviations: WFL(Within Functional Limits)   Gait velocity interpretation: at or above normal speed for age/gender    Stairs Stairs: Yes Stairs assistance: Modified independent (Device/Increase time) Stair Management: One rail Right;Alternating pattern;Forwards Number of Stairs: 8                 Pertinent Vitals/Pain Pain Assessment: No/denies pain    Home Living Family/patient expects to be discharged to:: Private residence Living Arrangements: Spouse/significant other Available Help at Discharge: Family;Available 24 hours/day Type of Home: House Home Access: Stairs to enter Entrance Stairs-Rails: Right;Left;Can reach both Entrance Stairs-Number of Steps: 3 Home Layout: Two level;Bed/bath upstairs Home Equipment: None      Prior  Function Level of Independence: Independent         Comments: 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         Extremity/Trunk Assessment   Upper Extremity Assessment: Overall WFL for tasks assessed           Lower Extremity Assessment: Overall WFL for tasks assessed      Cervical / Trunk Assessment: Normal  Communication   Communication: No difficulties  Cognition Arousal/Alertness: Awake/alert Behavior During Therapy: WFL for tasks assessed/performed Overall Cognitive Status: Within Functional Limits for tasks assessed                               Assessment/Plan    PT Assessment Patent does not need any further PT services  PT Diagnosis Difficulty walking;Abnormality of gait;Generalized weakness         PT Goals (Current goals can be found in the Care Plan section) Acute Rehab PT Goals Patient Stated Goal: to go home, feel better PT Goal Formulation: All assessment and education complete, DC therapy Potential to Achieve Goals: Good               End of Session   Activity Tolerance: Patient tolerated treatment well Patient left: in bed;with call bell/phone within reach Nurse Communication: Mobility status         Time: 3888-2800 PT Time Calculation (min) (ACUTE ONLY): 12 min   Charges:   PT Evaluation $Initial PT Evaluation Tier  I: 1 Procedure          Chinmay Squier B. [place], [place], DPT [phone]   07/01/2015, 12:36 PM

## 2015-07-01 NOTE — Progress Notes (Signed)
OT Cancellation Note  Patient Details Name: TYLIEK TIMBERMAN MRN: [id number] DOB: 07-Jun-1941   Cancelled Treatment:    Reason Eval/Treat Not Completed: OT screened, no needs identified, will sign off  Benito Mccreedy OTR/L [phone] 07/01/2015, 4:27 PM

## 2015-07-02 ENCOUNTER — Encounter (HOSPITAL_COMMUNITY): Payer: Self-pay | Admitting: Physician Assistant

## 2015-07-02 ENCOUNTER — Ambulatory Visit: Payer: Medicare Other | Admitting: Internal Medicine

## 2015-07-02 DIAGNOSIS — R1010 Upper abdominal pain, unspecified: Secondary | ICD-10-CM

## 2015-07-02 DIAGNOSIS — I10 Essential (primary) hypertension: Secondary | ICD-10-CM

## 2015-07-02 LAB — COMPREHENSIVE METABOLIC PANEL
ALT: 26 U/L (ref 17–63)
ANION GAP: 11 (ref 5–15)
AST: 51 U/L — ABNORMAL HIGH (ref 15–41)
Albumin: 4.4 g/dL (ref 3.5–5.0)
Alkaline Phosphatase: 64 U/L (ref 38–126)
BUN: 14 mg/dL (ref 6–20)
CHLORIDE: 103 mmol/L (ref 101–111)
CO2: 22 mmol/L (ref 22–32)
CREATININE: 1.14 mg/dL (ref 0.61–1.24)
Calcium: 9.5 mg/dL (ref 8.9–10.3)
Glucose, Bld: 114 mg/dL — ABNORMAL HIGH (ref 65–99)
POTASSIUM: 4.2 mmol/L (ref 3.5–5.1)
SODIUM: 136 mmol/L (ref 135–145)
Total Bilirubin: 1.8 mg/dL — ABNORMAL HIGH (ref 0.3–1.2)
Total Protein: 7.2 g/dL (ref 6.5–8.1)

## 2015-07-02 LAB — CBC
HCT: 46.1 % (ref 39.0–52.0)
Hemoglobin: 15.9 g/dL (ref 13.0–17.0)
MCH: 30.8 pg (ref 26.0–34.0)
MCHC: 34.5 g/dL (ref 30.0–36.0)
MCV: 89.2 fL (ref 78.0–100.0)
PLATELETS: 192 10*3/uL (ref 150–400)
RBC: 5.17 MIL/uL (ref 4.22–5.81)
RDW: 13.3 % (ref 11.5–15.5)
WBC: 8.4 10*3/uL (ref 4.0–10.5)

## 2015-07-02 LAB — GLUCOSE, CAPILLARY: Glucose-Capillary: 97 mg/dL (ref 65–99)

## 2015-07-02 MED ORDER — ONDANSETRON HCL 4 MG/2ML IJ SOLN
4.0000 mg | Freq: Four times a day (QID) | INTRAMUSCULAR | Status: DC | PRN
  Administered 2015-07-02 (×2): 4 mg via INTRAVENOUS
  Filled 2015-07-02 (×2): qty 2

## 2015-07-02 MED ORDER — PANTOPRAZOLE SODIUM 40 MG IV SOLR
40.0000 mg | Freq: Two times a day (BID) | INTRAVENOUS | Status: DC
  Administered 2015-07-02 – 2015-07-04 (×5): 40 mg via INTRAVENOUS
  Filled 2015-07-02 (×5): qty 40

## 2015-07-02 MED ORDER — MUSCLE RUB 10-15 % EX CREA
TOPICAL_CREAM | Freq: Two times a day (BID) | CUTANEOUS | Status: DC | PRN
  Administered 2015-07-02: 20:00:00 via TOPICAL
  Filled 2015-07-02: qty 85

## 2015-07-02 MED ORDER — SODIUM CHLORIDE 0.9 % IV SOLN: INTRAVENOUS | Status: DC

## 2015-07-02 MED ORDER — TROLAMINE SALICYLATE 10 % EX CREA
TOPICAL_CREAM | Freq: Two times a day (BID) | CUTANEOUS | Status: DC | PRN
  Filled 2015-07-02: qty 85

## 2015-07-02 MED ORDER — PANTOPRAZOLE SODIUM 40 MG IV SOLR: 40.0000 mg | Freq: Two times a day (BID) | INTRAVENOUS | Status: DC

## 2015-07-02 MED ORDER — GI COCKTAIL ~~LOC~~
30.0000 mL | Freq: Three times a day (TID) | ORAL | Status: DC | PRN
  Administered 2015-07-02: 30 mL via ORAL
  Filled 2015-07-02: qty 30

## 2015-07-02 MED ORDER — PROMETHAZINE HCL 25 MG/ML IJ SOLN
12.5000 mg | Freq: Four times a day (QID) | INTRAMUSCULAR | Status: DC | PRN
  Administered 2015-07-02 (×2): 12.5 mg via INTRAVENOUS
  Filled 2015-07-02 (×2): qty 1

## 2015-07-02 MED ORDER — BLISTEX MEDICATED EX OINT
TOPICAL_OINTMENT | CUTANEOUS | Status: DC | PRN
  Administered 2015-07-02: 1 via TOPICAL
  Filled 2015-07-02: qty 10

## 2015-07-02 NOTE — Consult Note (Signed)
[facility] Consult: 1:46 PM 07/02/2015  LOS: 1 day    Referring Provider: Dr Broadus John MD  Primary Care Physician:  Cathlean Cower, MD Primary Gastroenterologist:  Dr. Olevia Perches     Reason for Consultation:  Nausea, abdominal pain   HPI: [name] is a 74 y.o. male.  Pmh htn, hld, PAF on xarelto  SSS, s/p pacemaker, chronic back pain, hemi diaphragm paralysis, CAD, EF 55% in 04/2013.   11/2013  EGD.  For weight loss, epigastric pain.  Antral gastritis. Path: mild chronic inflammation, no H pylori.  11/2007 Colonoscopy. Screening.  3 polyps, largest 3 mm, diverticulosis.  Path: hyperplastic. 02/2008 Colonoscopy.  Universal diverticulosis.     Starting on Saturday 8/13 the patient developed pain from the umbilicus to the sub-xiphoid region in the mid abdomen he had accompanying nausea and clear emesis. There were no change in his bowel movements which for the most part are formed and daily though he does rely on fiber supplements to keep him regular.  Patient takes omeprazole daily which for the most part controls his GERD symptoms well.  No fever.  No headaches, no chest pain, no shortness of breath.  For chronic back pain he uses tramadol when necessary and daily Tylenol. He does not use NSAIDs or aspirin products. He denies sick contacts. He drinks bottled water. He does not drink alcohol but he does smoke marijuana on a daily basis, at least one joint per day. However he has been doing this for at least a year for management of his arthritic pain. He was admitted from the ED on Sunday 8/14. Thus far investigational workup is unrevealing  Labs unrevealing initially.  Today AST is up slightly to 51 from 32 and T bili is up to 1.8 from 0.9.  Transaminases, t bili, Lipase normal. tox screen positive for THC Ct shows  uncomplicated colonic diverticulosis, non-obstructing left kidney.  No blood on U/A.    Medical therapies thus far include twice daily IV Protonix, when necessary Phenergan which was discontinued, when necessary Zofran. As of today he is still vomiting.  Also today he is having noose onset loose/watery stools.     Past Medical History  Diagnosis Date  . Hypertension   . Hyperlipidemia   . BPH (benign prostatic hyperplasia)   . Diverticulosis of colon (without mention of hemorrhage)   . Esophageal reflux   . Abdominal pain, epigastric   . Depressive disorder, not elsewhere classified   . Lumbago   . Allergic rhinitis, cause unspecified   . 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 Persistent atrial fibrillation   . Chronic anticoagulation     xarelto  . High risk medication use   . Paralyzed hemidiaphragm     LEFT  . Coronary artery calcification seen on CAT scan 12/26/2012  . Arthritis   . Pacemaker   . Hyperplastic rectal polyp   . CHF (congestive heart failure)     Past Surgical History  Procedure Laterality Date  . Insert /  replace / remove pacemaker  05/2009     [facility] scientific ALTRUA 60  (serial number [id number]) dual-chamber pacemaker  . Pacemaker placement Bilateral     '05  . Esophagogastroduodenoscopy N/A 12/05/2013    Procedure: ESOPHAGOGASTRODUODENOSCOPY (EGD);  Surgeon: [name], MD;  Location: [facility];  Service: Endoscopy;  Laterality: N/A;    Prior to Admission medications   Medication Sig Start Date End Date Taking? Authorizing Provider  acetaminophen (TYLENOL ARTHRITIS PAIN) 650 MG CR tablet Take 650 mg by mouth every 8 (eight) hours as needed for pain.   Yes Historical Provider, MD  amLODipine (NORVASC) 10 MG tablet TAKE 1 TABLET BY MOUTH EVERY DAY 12/20/14  Yes Biagio Borg, MD  atorvastatin (LIPITOR) 40 MG tablet TAKE 1 TABLET BY MOUTH DAILY 11/06/14  Yes Biagio Borg, MD  [facility] ALLERGY RELIEF 180 MG tablet TAKE 1 TABLET  (180 MG TOTAL) BY MOUTH DAILY. 06/19/14  Yes Biagio Borg, MD  flecainide (TAMBOCOR) 100 MG tablet TAKE 1 TABLET BY MOUTH TWICE A DAY 12/20/14  Yes Biagio Borg, MD  fluticasone [facility]) 50 MCG/ACT nasal spray USE 2 SPRAYS IN [facility] NOSTRIL ONCE A DAY 06/29/14  Yes Biagio Borg, MD  latanoprost (XALATAN) 0.005 % ophthalmic solution Place 1 drop into both eyes daily.   Yes Historical Provider, MD  lisinopril (PRINIVIL,ZESTRIL) 10 MG tablet TAKE 1 TABLET BY MOUTH EVERY MORNING 12/06/14  Yes Biagio Borg, MD  omeprazole (PRILOSEC) 20 MG capsule TAKE ONE CAPSULE BY MOUTH TWICE A DAY 05/13/15  Yes [name], MD  oxybutynin (DITROPAN-XL) 10 MG 24 hr tablet TAKE 1 TABLET (10 MG TOTAL) BY MOUTH DAILY. 06/20/14  Yes Biagio Borg, MD  tamsulosin (FLOMAX) 0.4 MG CAPS capsule TAKE ONE CAPSULE BY MOUTH TWICE A DAY 01/22/15  Yes Biagio Borg, MD  traMADol (ULTRAM) 50 MG tablet TAKE 1 TABLET EVERY 6 HOURS AS NEEDED FOR PAIN 02/15/15  Yes Biagio Borg, MD  omeprazole (PRILOSEC) 20 MG capsule TAKE ONE CAPSULE TWICE A DAY Patient taking differently: TAKE ONE CAPSULE BY MOUTH TWICE A DAY 07/24/14   Amy S Esterwood, PA-C  XARELTO 20 MG TABS tablet TAKE 1 TABLET BY MOUTH EVERY DAY 04/18/15   Biagio Borg, MD    Scheduled Meds: . amLODipine  10 mg Oral Daily  . atorvastatin  40 mg Oral Daily  . flecainide  100 mg Oral BID  . fluticasone  2 spray Each Nare Daily  . latanoprost  1 drop Both Eyes Daily  . oxybutynin  10 mg Oral QHS  . pantoprazole (PROTONIX) IV  40 mg Intravenous Q12H  . rivaroxaban  20 mg Oral Q supper  . sodium chloride  3 mL Intravenous Q12H  . tamsulosin  0.4 mg Oral BID   Infusions:   PRN Meds: acetaminophen, gi cocktail, lip balm, morphine injection, ondansetron (ZOFRAN) IV, promethazine   Allergies as of 06/30/2015 - Review Complete 06/30/2015  Allergen Reaction Noted  . Pantoprazole sodium Itching 11/27/2013    Family History  Problem Relation Age of Onset  . Hypertension Father   . Lung  cancer Father   . Cancer Father   . Asthma Sister   . Hypertension Sister   . Arthritis Paternal Grandmother   . Diabetes Mother   . Hypertension Mother   . Hypertension Brother   . Hypertension Daughter   . Hypertension Son     Social History   Social History  . Marital Status:  Married    Spouse Name: N/A  . Number of Children: N/A  . Years of Education: N/A   Occupational History  . retired    Social History Main Topics  . Smoking status: Former Smoker -- 0.50 packs/day for 25 years    Types: Cigarettes    Quit date: 11/16/1978  . Smokeless tobacco: Former Systems developer    Types: Chew  . Alcohol Use: No  . Drug Use: No  . Sexual Activity: Not on file   Other Topics Concern  . 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     REVIEW OF SYSTEMS: Constitutional:  Has lost maybe 10 pounds in the last 18 months. Currently appetite is poor but generally it is fair. No early satiety. ENT:  No nose bleeds.  No dental issues. Pulm:  No cough or shortness of breath. CV:  No palpitations, no LE edema. No chest pain GU:  Chronic urinary frequency, some nocturia. GI:  No dysphagia. No bleeding per rectum. Heme:  No previous issues with low blood counts or excessive bleeding/bruising.   Transfusions:  None per his recall Neuro:  No headaches, no peripheral tingling or numbness Derm:  No itching, no rash or sores.  Endocrine:  No sweats or chills.  No polyuria or dysuria Immunization:  Vaccination schedule was reviewed. Travel:  None beyond local counties in last few months.    PHYSICAL EXAM: Vital signs in last 24 hours: Filed Vitals:   07/02/15 0511  BP: 95/67  Pulse: 62  Temp: 98.8 F (37.1 C)  Resp: 18   Wt Readings from Last 3 Encounters:  06/30/15 179 lb 6.4 oz (81.375 kg)  01/21/15 190 lb 12.8 oz (86.546 kg)  01/01/15 197 lb (89.359 kg)    General: Uncomfortable, non-acutely ill appearing AAM. Head:  No facial asymmetry, swelling or  signs of trauma.  Eyes:  No scleral icterus, no conjunctival pallor. Ears:  No obvious hearing deficit.  Nose:  No congestion, no discharge Mouth:  Clear, moist. No oral lesions, exudates or signs of Candida. Neck:  No JVD, no TMG. Lungs:  Diminished breath sounds but clear bilaterally. No dyspnea, no cough. Heart: RRR. No MRG. S1/S2 audible. Pacemaker present on upper left chest. Abdomen:  Soft, nondistended. Bowel sounds somewhat reduced but normal without tympanitic or tinkling sounds. No organomegaly. Mild tenderness in the upper abdomen without guarding or rebound. Tenderness is not focal..   Rectal: Deferred   Musc/Skeltl: Some arthritic changes in the fingers/hands. No joint effusions or contractures. Extremities:  No CCE.  Neurologic:  Oriented 3. Some restless movement of the extremities but no tremor. No gross neurologic deficits. Skin:  No rash, no sores Tattoos:  None seen. Nodes:  No cervical or inguinal adenopathy.   Psych:  Seems depressed and disengaged. He is polite and cooperative.  Intake/Output from previous day: 08/15 0701 - 08/16 0700 In: 300 [P.O.:300] Out: 1050 [Urine:1050] Intake/Output this shift: Total I/O In: 500 [P.O.:500] Out: -   LAB RESULTS:  Recent Labs  06/30/15 1948 07/02/15 0832  WBC 9.3 8.4  HGB 14.7 15.9  HCT 43.0 46.1  PLT 196 192   BMET Lab Results  Component Value Date   NA 136 07/02/2015   NA 136 06/30/2015   NA 139 01/01/2015   K 4.2 07/02/2015   K 4.1 06/30/2015   K 4.1 01/01/2015   CL 103 07/02/2015   CL 105 06/30/2015   CL 104  01/01/2015   CO2 22 07/02/2015   CO2 19* 06/30/2015   CO2 28 01/01/2015   GLUCOSE 114* 07/02/2015   GLUCOSE 123* 06/30/2015   GLUCOSE 95 01/01/2015   BUN 14 07/02/2015   BUN 17 06/30/2015   BUN 14 01/01/2015   CREATININE 1.14 07/02/2015   CREATININE 1.10 06/30/2015   CREATININE 0.92 01/01/2015   CALCIUM 9.5 07/02/2015   CALCIUM 9.4 06/30/2015   CALCIUM 9.6 01/01/2015   LFT  Recent  Labs  06/30/15 1948 07/02/15 0832  PROT 7.6 7.2  ALBUMIN 4.6 4.4  AST 32 51*  ALT 22 26  ALKPHOS 61 64  BILITOT 0.9 1.8*   PT/INR Lab Results  Component Value Date   INR 1.45 07/01/2015   INR 1.3 06/02/2009   Hepatitis Panel No results for input(s): HEPBSAG, HCVAB, HEPAIGM, HEPBIGM in the last 72 hours. C-Diff No components found for: CDIFF Lipase     Component Value Date/Time   LIPASE 29 06/30/2015 1948    Drugs of Abuse     Component Value Date/Time   LABOPIA NONE DETECTED 07/01/2015 1617   COCAINSCRNUR NONE DETECTED 07/01/2015 1617   LABBENZ NONE DETECTED 07/01/2015 1617   AMPHETMU NONE DETECTED 07/01/2015 1617   THCU POSITIVE* 07/01/2015 1617   LABBARB NONE DETECTED 07/01/2015 1617     RADIOLOGY STUDIES: Ct Abdomen Pelvis W Contrast  07/01/2015   CLINICAL DATA:  Nausea, vomiting and diffuse abdominal pain since yesterday. History of hypertension, pacemaker chronic back pain. No previous relevant abdominal surgery. Initial encounter.  EXAM: CT ABDOMEN AND PELVIS WITH CONTRAST  TECHNIQUE: Multidetector CT imaging of the abdomen and pelvis was performed using the standard protocol following bolus administration of intravenous contrast.  CONTRAST:  147m OMNIPAQUE IOHEXOL 300 MG/ML  SOLN  COMPARISON:  Radiographs 06/30/2015.  CT 11/17/2013.  FINDINGS: Lower chest: Stable left basilar scarring or atelectasis. Cardiac pacemaker noted. There is no pleural or pericardial effusion.  Hepatobiliary: Stable scattered calcified granulomas. The liver otherwise appears unremarkable. No evidence of gallstones, gallbladder wall thickening or biliary dilatation.  Pancreas: Unremarkable. No pancreatic ductal dilatation or surrounding inflammatory changes.  Spleen: Normal in size without focal abnormality.  Adrenals/Urinary Tract: Both adrenal glands appear normal. Small nonobstructing left renal calculi. No evidence hydronephrosis or ureteral calculus. The kidneys otherwise appear  unremarkable. No bladder abnormality seen.  Stomach/Bowel: No evidence of bowel wall thickening, distention or surrounding inflammatory change. Moderate diverticular changes throughout the sigmoid colon. The appendix appears normal.  Vascular/Lymphatic: There are no enlarged abdominal or pelvic lymph nodes. Stable moderate aortoiliac atherosclerosis. There are stable scattered retroperitoneal calcifications within the left upper quadrant.  Reproductive: Stable prostatic enlargement. The seminal vesicles appear unremarkable.  Other: No evidence of abdominal wall mass or hernia.  Musculoskeletal: No acute or significant osseous findings. Stable lower lumbar spondylosis.  IMPRESSION: 1. No acute findings or explanation for the patient's symptoms. 2. Diverticulosis without evidence of acute inflammation. 3. Nonobstructing left renal calculi.   Electronically Signed   By: WRichardean SaleM.D.   On: 07/01/2015 07:15   Dg Abd Acute W/chest  06/30/2015   CLINICAL DATA:  Nausea and vomiting  EXAM: DG ABDOMEN ACUTE W/ 1V CHEST  COMPARISON:  11/17/2013.  FINDINGS: Cardiac shadow is within normal limits. A pacing device is seen. The lungs are clear bilaterally. Mild elevation of left hemidiaphragm is seen.  Scattered large and small bowel gas is noted. No obstructive changes are seen. Degenerative changes of lumbar spine are noted. No free air is seen.  Multiple mesenteric calcifications are again identified throughout the abdomen.  IMPRESSION: No acute abnormality noted.   Electronically Signed   By: Inez Catalina M.D.   On: 06/30/2015 20:35    ENDOSCOPIC STUDIES: 11/2013  EGD.  For weight loss, epigastric pain.  Antral gastritis. Path: mild chronic inflammation, no H pylori.   11/2007 Colonoscopy. Screening.  3 polyps, largest 3 mm, diverticulosis.  Path: hyperplastic.  02/2008 Colonoscopy.  Universal diverticulosis.     IMPRESSION:   *  N/V/upper abdominal pain.  Acute onset. During hospitalization has developed  minor elevation of total bili and AST. Tox screen positive for THC, patient admits to daily marijuana smoking. CT scan shows only uncomplicated diverticulosis. Rule out poorly controlled GERD. Rule out occult biliary disease. Rule out ulcer disease. Rule out viral gastroenteritis.   PLAN:     *  Per Dr. Deatra Ina.   *  C met in the morning.  *  Chronic Xarelto. For PAF   Azucena Freed  07/02/2015, 1:46 PM Pager: [phone]

## 2015-07-02 NOTE — Care Management Note (Signed)
Case Management Note  Patient Details  Name: [name] MRN: [id number] Date of Birth: 10/22/1941  Subjective/Objective:     Patient is from home, pta indep.  No needs.                Action/Plan:   Expected Discharge Date:                  Expected Discharge Plan:  Home/Self Care  In-House Referral:     Discharge planning Services  CM Consult  Post Acute Care Choice:    Choice offered to:     DME Arranged:    DME Agency:     HH Arranged:    [place] Agency:     Status of Service:  Completed, signed off  Medicare Important Message Given:    Date Medicare IM Given:    Medicare IM give by:    Date Additional Medicare IM Given:    Additional Medicare Important Message give by:     If discussed at [place] of Stay Meetings, dates discussed:    Additional Comments:  Zenon Mayo, RN 07/02/2015, 11:14 AM

## 2015-07-02 NOTE — Progress Notes (Addendum)
[facility] PROGRESS NOTE  ELEAZAR KIMMEY EHU:[id number] DOB: 1941/05/26 DOA: 06/30/2015 PCP: Cathlean Cower, MD  Assessment/Plan: Abd pain/Nausea/Vomiting -severe heartburn like symptoms, daughter tells me that PPI allergy listed is a mistake -change omeprazole to Protonix IV, add Gi cocktail -CT abd normal, labs unremarkable, troponin and UA unremarkable too -very symptomatic this am -clears, IVF, GI consult, consider repeat EGD -EGD 1/15 with mild gastritis  Sick sinus syndrome: -has Psychologist, forensic.  -on Flecainide  HLD: Last LDL was 104 on 01/01/15. -Continue home medications: Lipitor  Atrial Fibrillation: CHA2DS2-VASc Score is 3, -Heart rate is well controlled. -continue Xarelto and metoprolol  HTN: -Continue amlodipine, hold lisinopril, -use parameters  BPH: stable - Continue Flomax/oxybutynin  DVT ppx: Xarelto  Code Status:Full Code Family Communication: daughter at bedside Disposition Plan: home when improved   Consultants:  GI pending  HPI/Subjective: Feels miserable this am, severe heartburn, vomited x2, daughter thinks PPI allergy was a mistake  Objective: Filed Vitals:   07/02/15 0511  BP: 95/67  Pulse: 62  Temp: 98.8 F (37.1 C)  Resp: 18    Intake/Output Summary (Last 24 hours) at 07/02/15 1140 Last data filed at 07/02/15 0952  Gross per 24 hour  Intake    740 ml  Output    550 ml  Net    190 ml   Filed Weights   06/30/15 1921 06/30/15 2355  Weight: 81.647 kg (180 lb) 81.375 kg (179 lb 6.4 oz)    Exam:   General:  AAOx3, uncomfortable appearing  Cardiovascular: S1S2/RRR  Respiratory: CTAB  Abdomen: soft, NT, BS present  Musculoskeletal: no edema c/c   Data Reviewed: Basic Metabolic Panel:  Recent Labs Lab 06/30/15 1948  NA 136  K 4.1  CL 105  CO2 19*  GLUCOSE 123*  BUN 17  CREATININE 1.10  CALCIUM 9.4   Liver Function Tests:  Recent Labs Lab 06/30/15 1948  AST 32  ALT 22  ALKPHOS 61  BILITOT 0.9  PROT 7.6   ALBUMIN 4.6    Recent Labs Lab 06/30/15 1948  LIPASE 29   No results for input(s): AMMONIA in the last 168 hours. CBC:  Recent Labs Lab 06/30/15 1948  WBC 9.3  NEUTROABS 7.5  HGB 14.7  HCT 43.0  MCV 88.5  PLT 196   Cardiac Enzymes:  Recent Labs Lab 07/01/15 1419  TROPONINI <0.03   BNP (last 3 results) No results for input(s): BNP in the last 8760 hours.  ProBNP (last 3 results) No results for input(s): PROBNP in the last 8760 hours.  CBG:  Recent Labs Lab 07/01/15 0808 07/02/15 0753  GLUCAP 112* 97    Recent Results (from the past 240 hour(s))  Culture, blood (x 2)     Status: None (Preliminary result)   Collection Time: 07/01/15  3:34 AM  Result Value Ref Range Status   Specimen Description BLOOD RIGHT HAND  Final   Special Requests IN PEDIATRIC BOTTLE 1CC  Final   Culture PENDING  Incomplete   Report Status PENDING  Incomplete  Culture, Urine     Status: None (Preliminary result)   Collection Time: 07/01/15  4:17 PM  Result Value Ref Range Status   Specimen Description URINE, RANDOM  Final   Special Requests NONE  Final   Culture NO GROWTH < 24 HOURS  Final   Report Status PENDING  Incomplete     Studies: Ct Abdomen Pelvis W Contrast  07/01/2015   CLINICAL DATA:  Nausea, vomiting and diffuse abdominal pain  since yesterday. History of hypertension, pacemaker chronic back pain. No previous relevant abdominal surgery. Initial encounter.  EXAM: CT ABDOMEN AND PELVIS WITH CONTRAST  TECHNIQUE: Multidetector CT imaging of the abdomen and pelvis was performed using the standard protocol following bolus administration of intravenous contrast.  CONTRAST:  165mL OMNIPAQUE IOHEXOL 300 MG/ML  SOLN  COMPARISON:  Radiographs 06/30/2015.  CT 11/17/2013.  FINDINGS: Lower chest: Stable left basilar scarring or atelectasis. Cardiac pacemaker noted. There is no pleural or pericardial effusion.  Hepatobiliary: Stable scattered calcified granulomas. The liver otherwise  appears unremarkable. No evidence of gallstones, gallbladder wall thickening or biliary dilatation.  Pancreas: Unremarkable. No pancreatic ductal dilatation or surrounding inflammatory changes.  Spleen: Normal in size without focal abnormality.  Adrenals/Urinary Tract: Both adrenal glands appear normal. Small nonobstructing left renal calculi. No evidence hydronephrosis or ureteral calculus. The kidneys otherwise appear unremarkable. No bladder abnormality seen.  Stomach/Bowel: No evidence of bowel wall thickening, distention or surrounding inflammatory change. Moderate diverticular changes throughout the sigmoid colon. The appendix appears normal.  Vascular/Lymphatic: There are no enlarged abdominal or pelvic lymph nodes. Stable moderate aortoiliac atherosclerosis. There are stable scattered retroperitoneal calcifications within the left upper quadrant.  Reproductive: Stable prostatic enlargement. The seminal vesicles appear unremarkable.  Other: No evidence of abdominal wall mass or hernia.  Musculoskeletal: No acute or significant osseous findings. Stable lower lumbar spondylosis.  IMPRESSION: 1. No acute findings or explanation for the patient's symptoms. 2. Diverticulosis without evidence of acute inflammation. 3. Nonobstructing left renal calculi.   Electronically Signed   By: Richardean Sale M.D.   On: 07/01/2015 07:15   Dg Abd Acute W/chest  06/30/2015   CLINICAL DATA:  Nausea and vomiting  EXAM: DG ABDOMEN ACUTE W/ 1V CHEST  COMPARISON:  11/17/2013.  FINDINGS: Cardiac shadow is within normal limits. A pacing device is seen. The lungs are clear bilaterally. Mild elevation of left hemidiaphragm is seen.  Scattered large and small bowel gas is noted. No obstructive changes are seen. Degenerative changes of lumbar spine are noted. No free air is seen. Multiple mesenteric calcifications are again identified throughout the abdomen.  IMPRESSION: No acute abnormality noted.   Electronically Signed   By: Inez Catalina M.D.   On: 06/30/2015 20:35    Scheduled Meds: . amLODipine  10 mg Oral Daily  . atorvastatin  40 mg Oral Daily  . flecainide  100 mg Oral BID  . fluticasone  2 spray Each Nare Daily  . latanoprost  1 drop Both Eyes Daily  . lisinopril  10 mg Oral q morning - 10a  . oxybutynin  10 mg Oral QHS  . pantoprazole (PROTONIX) IV  40 mg Intravenous Q12H  . rivaroxaban  20 mg Oral Q supper  . sodium chloride  3 mL Intravenous Q12H  . tamsulosin  0.4 mg Oral BID   Continuous Infusions:  Antibiotics Given (last 72 hours)    None      Principal Problem:   Abdominal pain Active Problems:   Sick sinus syndrome   Hyperlipidemia   Depression   Essential hypertension   Atrial fibrillation   BPH (benign prostatic hyperplasia)   Cervical radiculopathy   Varicose vein of leg   Coronary artery calcification seen on CAT scan   Lumbar back pain   Nausea and vomiting   Alcohol abuse    Time spent: 40min    [place] [facility] Pager [phone]. If 7PM-7AM, please contact night-coverage at [email or web address], password [facility] 07/02/2015, 11:40 AM  LOS: 1 day

## 2015-07-03 ENCOUNTER — Inpatient Hospital Stay (HOSPITAL_COMMUNITY): Payer: Medicare Other

## 2015-07-03 DIAGNOSIS — R1013 Epigastric pain: Secondary | ICD-10-CM

## 2015-07-03 LAB — COMPREHENSIVE METABOLIC PANEL
ALK PHOS: 65 U/L (ref 38–126)
ALT: 24 U/L (ref 17–63)
AST: 32 U/L (ref 15–41)
Albumin: 4.1 g/dL (ref 3.5–5.0)
Anion gap: 11 (ref 5–15)
BUN: 18 mg/dL (ref 6–20)
CALCIUM: 9.6 mg/dL (ref 8.9–10.3)
CHLORIDE: 101 mmol/L (ref 101–111)
CO2: 25 mmol/L (ref 22–32)
CREATININE: 0.99 mg/dL (ref 0.61–1.24)
Glucose, Bld: 108 mg/dL — ABNORMAL HIGH (ref 65–99)
Potassium: 4.3 mmol/L (ref 3.5–5.1)
Sodium: 137 mmol/L (ref 135–145)
Total Bilirubin: 1.2 mg/dL (ref 0.3–1.2)
Total Protein: 7.2 g/dL (ref 6.5–8.1)

## 2015-07-03 LAB — CBC
HEMATOCRIT: 47.4 % (ref 39.0–52.0)
HEMOGLOBIN: 16.3 g/dL (ref 13.0–17.0)
MCH: 30.4 pg (ref 26.0–34.0)
MCHC: 34.4 g/dL (ref 30.0–36.0)
MCV: 88.4 fL (ref 78.0–100.0)
PLATELETS: 187 10*3/uL (ref 150–400)
RBC: 5.36 MIL/uL (ref 4.22–5.81)
RDW: 13 % (ref 11.5–15.5)
WBC: 13.8 10*3/uL — AB (ref 4.0–10.5)

## 2015-07-03 LAB — GLUCOSE, CAPILLARY: GLUCOSE-CAPILLARY: 111 mg/dL — AB (ref 65–99)

## 2015-07-03 LAB — URINE CULTURE: Culture: 2000

## 2015-07-03 MED ORDER — SINCALIDE 5 MCG IJ SOLR
0.0200 ug/kg | Freq: Once | INTRAMUSCULAR | Status: AC
  Administered 2015-07-03: 1.6 ug via INTRAVENOUS
  Filled 2015-07-03: qty 5

## 2015-07-03 MED ORDER — SODIUM CHLORIDE 0.9 % IV BOLUS (SEPSIS)
500.0000 mL | Freq: Once | INTRAVENOUS | Status: AC
  Administered 2015-07-03: 500 mL via INTRAVENOUS

## 2015-07-03 MED ORDER — CIPROFLOXACIN IN D5W 400 MG/200ML IV SOLN
400.0000 mg | Freq: Two times a day (BID) | INTRAVENOUS | Status: DC
  Administered 2015-07-03 – 2015-07-04 (×3): 400 mg via INTRAVENOUS
  Filled 2015-07-03 (×3): qty 200

## 2015-07-03 MED ORDER — TECHNETIUM TC 99M MEBROFENIN IV KIT
5.0000 | PACK | Freq: Once | INTRAVENOUS | Status: DC | PRN
  Administered 2015-07-03: 5 via INTRAVENOUS
  Filled 2015-07-03: qty 6

## 2015-07-03 MED ORDER — SINCALIDE 5 MCG IJ SOLR
INTRAMUSCULAR | Status: AC
  Administered 2015-07-03: 1.6 ug via INTRAVENOUS
  Filled 2015-07-03: qty 5

## 2015-07-03 MED ORDER — METRONIDAZOLE IN NACL 5-0.79 MG/ML-% IV SOLN
500.0000 mg | Freq: Three times a day (TID) | INTRAVENOUS | Status: DC
  Administered 2015-07-03 (×3): 500 mg via INTRAVENOUS
  Filled 2015-07-03 (×4): qty 100

## 2015-07-03 MED ORDER — STERILE WATER FOR INJECTION IJ SOLN
INTRAMUSCULAR | Status: AC
  Administered 2015-07-03: 5 mL
  Filled 2015-07-03: qty 10

## 2015-07-03 NOTE — Progress Notes (Signed)
Daily Rounding Note  07/03/2015, 10:07 AM  LOS: 2 days   SUBJECTIVE:       Pain resolved.  Last anaelgesic was acetaminophen at 0730 yesterday.  No nausea.  OBJECTIVE:         Vital signs in last 24 hours:    Temp:  [99.4 F (37.4 C)-100 F (37.8 C)] 99.4 F (37.4 C) (08/17 0537) Pulse Rate:  [60-66] 61 (08/17 0537) Resp:  [18-20] 18 (08/17 0537) BP: (123-136)/(68-86) 123/73 mmHg (08/17 0537) SpO2:  [99 %-100 %] 100 % (08/17 0537) Last BM Date: 07/01/15 Filed Weights   06/30/15 1921 06/30/15 2355  Weight: 180 lb (81.647 kg) 179 lb 6.4 oz (81.375 kg)   General: alert, comfortable   Heart: RRR Chest: clear bil Abdomen: soft, NT, ND.  Active BS.   Extremities: no CCE Neuro/Psych:  Oriented x 3.  Less anxious/more relaxed than yesterday.    Intake/Output from previous day: 08/16 0701 - 08/17 0700 In: 1212 [P.O.:920; I.V.:292] Out: -   Intake/Output this shift:    Lab Results:  Recent Labs  06/30/15 1948 07/02/15 0832 07/03/15 0525  WBC 9.3 8.4 13.8*  HGB 14.7 15.9 16.3  HCT 43.0 46.1 47.4  PLT 196 192 187   BMET  Recent Labs  06/30/15 1948 07/02/15 0832 07/03/15 0525  NA 136 136 137  K 4.1 4.2 4.3  CL 105 103 101  CO2 19* 22 25  GLUCOSE 123* 114* 108*  BUN 17 14 18   CREATININE 1.10 1.14 0.99  CALCIUM 9.4 9.5 9.6   LFT  Recent Labs  06/30/15 1948 07/02/15 0832 07/03/15 0525  PROT 7.6 7.2 7.2  ALBUMIN 4.6 4.4 4.1  AST 32 51* 32  ALT 22 26 24   ALKPHOS 61 64 65  BILITOT 0.9 1.8* 1.2   PT/INR  Recent Labs  07/01/15 0320  LABPROT 17.7*  INR 1.45   Hepatitis Panel No results for input(s): HEPBSAG, HCVAB, HEPAIGM, HEPBIGM in the last 72 hours.  Studies/Results: No results found.   Scheduled Meds: . amLODipine  10 mg Oral Daily  . atorvastatin  40 mg Oral Daily  . ciprofloxacin  400 mg Intravenous Q12H  . flecainide  100 mg Oral BID  . fluticasone  2 spray Each Nare  Daily  . latanoprost  1 drop Both Eyes Daily  . metronidazole  500 mg Intravenous Q8H  . oxybutynin  10 mg Oral QHS  . pantoprazole (PROTONIX) IV  40 mg Intravenous Q12H  . rivaroxaban  20 mg Oral Q supper  . sodium chloride  3 mL Intravenous Q12H  . tamsulosin  0.4 mg Oral BID   Continuous Infusions: . sodium chloride 1,000 mL (07/02/15 1606)   PRN Meds:.acetaminophen, gi cocktail, lip balm, morphine injection, MUSCLE RUB, ondansetron (ZOFRAN) IV, promethazine   ASSESMENT:   * N/V/upper abdominal pain. Acute onset.  Transitory, minor elevation of total bili and AST. THC+, patient admits to daily marijuana smoking. CT scan shows only uncomplicated diverticulosis. Rule out poorly controlled GERD. Rule out occult biliary disease. Rule out ulcer disease. Rule out viral gastroenteritis. Sxs improved over last 24 hours. On IV q12 hour PPI, on Omeprazole 20 BID at home (not sure he is taking as Rx'd)   *  New leukocytosis. T max today 100.0.      PLAN   *  HIDA scan today. EGD set for 11 AM tomorrow.     [name]  07/03/2015, 10:07 AM  Pager: [phone]

## 2015-07-03 NOTE — Progress Notes (Signed)
Pt BP 77/54. Schorr, NP paged. Awaiting further orders. Will continue to monitor pt.

## 2015-07-03 NOTE — Progress Notes (Signed)
[facility] PROGRESS NOTE  NAJEH CREDIT TLX:[id number] DOB: 06-04-1941 DOA: 06/30/2015 PCP: Cathlean Cower, MD  Assessment/Plan: Abd pain/Nausea/Vomiting -severe heartburn like symptoms, -change omeprazole to Protonix IV, on  Gi cocktail -CT abd normal, labs unremarkable, troponin and UA unremarkable too -EGD 1/15 with mild gastritis - GI consult appreciated, normal HIDA, plan for EGD tomorrow. - Started on IV Cipro and Flagyl given leukocytosis and low-grade temperature  Sick sinus syndrome: -has Psychologist, forensic.  -on Flecainide  HLD: Last LDL was 104 on 01/01/15. -Continue home medications: Lipitor  Atrial Fibrillation: CHA2DS2-VASc Score is 3, -Heart rate is well controlled. -continue Xarelto and metoprolol  HTN: -Continue amlodipine, hold lisinopril, -use parameters  BPH: stable - Continue Flomax/oxybutynin  DVT ppx: Xarelto  Code Status:Full Code Family Communication: daughter at bedside Disposition Plan: home when improved   Consultants:  GI pending  HPI/Subjective: Feels miserable this am, severe heartburn, vomited x2, daughter thinks PPI allergy was a mistake  Objective: Filed Vitals:   07/03/15 0537  BP: 123/73  Pulse: 61  Temp: 99.4 F (37.4 C)  Resp: 18    Intake/Output Summary (Last 24 hours) at 07/03/15 1729 Last data filed at 07/03/15 1659  Gross per 24 hour  Intake    692 ml  Output      0 ml  Net    692 ml   Filed Weights   06/30/15 1921 06/30/15 2355  Weight: 81.647 kg (180 lb) 81.375 kg (179 lb 6.4 oz)    Exam:   General:  AAOx3, uncomfortable appearing  Cardiovascular: S1S2/RRR  Respiratory: CTAB  Abdomen: soft, NT, BS present  Musculoskeletal: no edema c/c   Data Reviewed: Basic Metabolic Panel:  Recent Labs Lab 06/30/15 1948 07/02/15 0832 07/03/15 0525  NA 136 136 137  K 4.1 4.2 4.3  CL 105 103 101  CO2 19* 22 25  GLUCOSE 123* 114* 108*  BUN 17 14 18   CREATININE 1.10 1.14 0.99  CALCIUM 9.4 9.5 9.6    Liver Function Tests:  Recent Labs Lab 06/30/15 1948 07/02/15 0832 07/03/15 0525  AST 32 51* 32  ALT 22 26 24   ALKPHOS 61 64 65  BILITOT 0.9 1.8* 1.2  PROT 7.6 7.2 7.2  ALBUMIN 4.6 4.4 4.1    Recent Labs Lab 06/30/15 1948  LIPASE 29   No results for input(s): AMMONIA in the last 168 hours. CBC:  Recent Labs Lab 06/30/15 1948 07/02/15 0832 07/03/15 0525  WBC 9.3 8.4 13.8*  NEUTROABS 7.5  --   --   HGB 14.7 15.9 16.3  HCT 43.0 46.1 47.4  MCV 88.5 89.2 88.4  PLT 196 192 187   Cardiac Enzymes:  Recent Labs Lab 07/01/15 1419  TROPONINI <0.03   BNP (last 3 results) No results for input(s): BNP in the last 8760 hours.  ProBNP (last 3 results) No results for input(s): PROBNP in the last 8760 hours.  CBG:  Recent Labs Lab 07/01/15 0808 07/02/15 0753 07/03/15 0759  GLUCAP 112* 97 111*    Recent Results (from the past 240 hour(s))  Culture, blood (x 2)     Status: None (Preliminary result)   Collection Time: 07/01/15  3:20 AM  Result Value Ref Range Status   Specimen Description BLOOD LEFT HAND  Final   Special Requests BOTTLES DRAWN AEROBIC ONLY 5CC  Final   Culture NO GROWTH 2 DAYS  Final   Report Status PENDING  Incomplete  Culture, blood (x 2)     Status: None (Preliminary result)  Collection Time: 07/01/15  3:34 AM  Result Value Ref Range Status   Specimen Description BLOOD RIGHT HAND  Final   Special Requests IN PEDIATRIC BOTTLE 1CC  Final   Culture NO GROWTH 2 DAYS  Final   Report Status PENDING  Incomplete  Culture, Urine     Status: None   Collection Time: 07/01/15  4:17 PM  Result Value Ref Range Status   Specimen Description URINE, RANDOM  Final   Special Requests NONE  Final   Culture 2,000 COLONIES/mL INSIGNIFICANT GROWTH  Final   Report Status 07/03/2015 FINAL  Final     Studies: Nm Hepato W/eject Fract  07/03/2015   CLINICAL DATA:  Abnormal liver function tests with nausea, vomiting and right upper quadrant pain.  EXAM:  NUCLEAR MEDICINE HEPATOBILIARY IMAGING WITH GALLBLADDER EF  TECHNIQUE: Sequential images of the abdomen were obtained out to 60 minutes following intravenous administration of radiopharmaceutical. After slow intravenous infusion of 1.63 micrograms Cholecystokinin, gallbladder ejection fraction was determined.  RADIOPHARMACEUTICALS:  5.0 mCi Tc-35m Choletec IV  COMPARISON:  CT abdomen pelvis 07/01/2015.  FINDINGS: There is prompt and uniform uptake in the liver. Gallbladder is visualized at 1 minute. At 45 min, normal ejection fraction is greater than 40%.  IMPRESSION: Normal study.   Electronically Signed   By: Lorin Picket M.D.   On: 07/03/2015 14:34    Scheduled Meds: . amLODipine  10 mg Oral Daily  . atorvastatin  40 mg Oral Daily  . ciprofloxacin  400 mg Intravenous Q12H  . flecainide  100 mg Oral BID  . fluticasone  2 spray Each Nare Daily  . latanoprost  1 drop Both Eyes Daily  . metronidazole  500 mg Intravenous Q8H  . oxybutynin  10 mg Oral QHS  . pantoprazole (PROTONIX) IV  40 mg Intravenous Q12H  . rivaroxaban  20 mg Oral Q supper  . sodium chloride  3 mL Intravenous Q12H  . tamsulosin  0.4 mg Oral BID   Continuous Infusions: . sodium chloride 1,000 mL (07/02/15 1606)   Antibiotics Given (last 72 hours)    Date/Time Action Medication Dose Rate   07/03/15 0916 Given   metroNIDAZOLE (FLAGYL) IVPB 500 mg 500 mg 100 mL/hr   07/03/15 6283 Given   ciprofloxacin (CIPRO) IVPB 400 mg 400 mg 200 mL/hr   07/03/15 1549 Given   metroNIDAZOLE (FLAGYL) IVPB 500 mg 500 mg 100 mL/hr      Principal Problem:   Abdominal pain Active Problems:   Sick sinus syndrome   Hyperlipidemia   Depression   Essential hypertension   Atrial fibrillation   BPH (benign prostatic hyperplasia)   Cervical radiculopathy   Varicose vein of leg   Coronary artery calcification seen on CAT scan   Lumbar back pain   Nausea and vomiting   Alcohol abuse    Time spent: 84min    [facility],  [place] [facility] Pager [phone]. If 7PM-7AM, please contact night-coverage at [email or web address], password [facility] 07/03/2015, 5:29 PM  LOS: 2 days

## 2015-07-03 NOTE — Progress Notes (Signed)
Schorr, NP given orders for 500 cc bolus of NS. Post bolus pt BP 90/61. Will continue to monitor pt.

## 2015-07-04 ENCOUNTER — Encounter (HOSPITAL_COMMUNITY): Payer: Self-pay | Admitting: *Deleted

## 2015-07-04 ENCOUNTER — Encounter (HOSPITAL_COMMUNITY)
Admission: EM | Disposition: A | Discharge status: Home / Self Care | Payer: Self-pay | Source: Home / Self Care | Attending: Internal Medicine

## 2015-07-04 HISTORY — PX: ESOPHAGOGASTRODUODENOSCOPY: SHX5428

## 2015-07-04 LAB — GLUCOSE, CAPILLARY: Glucose-Capillary: 91 mg/dL (ref 65–99)

## 2015-07-04 LAB — CBC
HEMATOCRIT: 45.4 % (ref 39.0–52.0)
HEMOGLOBIN: 15.6 g/dL (ref 13.0–17.0)
MCH: 31 pg (ref 26.0–34.0)
MCHC: 34.4 g/dL (ref 30.0–36.0)
MCV: 90.1 fL (ref 78.0–100.0)
Platelets: 188 10*3/uL (ref 150–400)
RBC: 5.04 MIL/uL (ref 4.22–5.81)
RDW: 13.1 % (ref 11.5–15.5)
WBC: 9.8 10*3/uL (ref 4.0–10.5)

## 2015-07-04 LAB — COMPREHENSIVE METABOLIC PANEL
ALK PHOS: 56 U/L (ref 38–126)
ALT: 22 U/L (ref 17–63)
ANION GAP: 9 (ref 5–15)
AST: 26 U/L (ref 15–41)
Albumin: 3.6 g/dL (ref 3.5–5.0)
BILIRUBIN TOTAL: 1.6 mg/dL — AB (ref 0.3–1.2)
BUN: 21 mg/dL — ABNORMAL HIGH (ref 6–20)
CALCIUM: 8.8 mg/dL — AB (ref 8.9–10.3)
CO2: 24 mmol/L (ref 22–32)
Chloride: 103 mmol/L (ref 101–111)
Creatinine, Ser: 0.99 mg/dL (ref 0.61–1.24)
GFR calc non Af Amer: >60 mL/min (ref >60)
Glucose, Bld: 85 mg/dL (ref 65–99)
POTASSIUM: 4.1 mmol/L (ref 3.5–5.1)
SODIUM: 136 mmol/L (ref 135–145)
TOTAL PROTEIN: 6.2 g/dL — AB (ref 6.5–8.1)

## 2015-07-04 SURGERY — EGD (ESOPHAGOGASTRODUODENOSCOPY)
Anesthesia: Moderate Sedation

## 2015-07-04 MED ORDER — MIDAZOLAM HCL 5 MG/ML IJ SOLN
INTRAMUSCULAR | Status: AC
  Filled 2015-07-04: qty 2

## 2015-07-04 MED ORDER — SODIUM CHLORIDE 0.9 % IV SOLN
INTRAVENOUS | Status: DC
  Administered 2015-07-04: 10:00:00 via INTRAVENOUS

## 2015-07-04 MED ORDER — FENTANYL CITRATE (PF) 100 MCG/2ML IJ SOLN
INTRAMUSCULAR | Status: AC
  Filled 2015-07-04: qty 2

## 2015-07-04 MED ORDER — FENTANYL CITRATE (PF) 100 MCG/2ML IJ SOLN
INTRAMUSCULAR | Status: DC | PRN
  Administered 2015-07-04 (×2): 25 ug via INTRAVENOUS

## 2015-07-04 MED ORDER — BUTAMBEN-TETRACAINE-BENZOCAINE 2-2-14 % EX AERO
INHALATION_SPRAY | CUTANEOUS | Status: DC | PRN
  Administered 2015-07-04: 2 via TOPICAL

## 2015-07-04 MED ORDER — MIDAZOLAM HCL 5 MG/5ML IJ SOLN
INTRAMUSCULAR | Status: DC | PRN
  Administered 2015-07-04: 1 mg via INTRAVENOUS
  Administered 2015-07-04 (×2): 2 mg via INTRAVENOUS

## 2015-07-04 MED ORDER — MORPHINE SULFATE (PF) 2 MG/ML IV SOLN: 2.0000 mg | INTRAVENOUS | Status: DC | PRN

## 2015-07-04 NOTE — Progress Notes (Addendum)
[name] to be D/C'd Home per MD order.  Discussed with the patient and all questions fully answered.  VSS, Skin clean, dry and intact without evidence of skin break down, no evidence of skin tears noted. IV catheter discontinued intact. Site without signs and symptoms of complications. Dressing and pressure applied.  An After Visit Summary was printed and given to the patient. Patient received prescription.  D/c education completed with patient/family including follow up instructions, medication list, d/c activities limitations if indicated, with other d/c instructions as indicated by MD - patient able to verbalize understanding, all questions fully answered.   Patient instructed to return to ED, call 911, or call MD for any changes in condition.   Patient refsued escorted via [place].  D/C home via private auto.  L'ESPERANCE, [name] 07/04/2015 3:43 PM

## 2015-07-04 NOTE — Progress Notes (Addendum)
MD made aware of hypotension and MD order to discontinue amlodipine and order for NS at 69ml/hr.   10:03 AM [place], PA order for NS at 148ml/hr for pre procedure and to cancel NS at 62ml/hr

## 2015-07-04 NOTE — Progress Notes (Signed)
Daily Rounding Note  07/04/2015, 10:01 AM  LOS: 3 days   SUBJECTIVE:       Feels well.  No abd pain nor n/v.  Last BM > 2 days ago.  Treated with 500 cc bolus NS for hyptension of 77/54 at 10 PM last night.  NS initiated this AM with BPs 90s/50s and Amlodipine held.   OBJECTIVE:         Vital signs in last 24 hours:    Temp:  [98.7 F (37.1 C)] 98.7 F (37.1 C) (08/18 0532) Pulse Rate:  [59-77] 68 (08/18 0812) Resp:  [15-18] 15 (08/18 0812) BP: (69-113)/(52-71) 93/52 mmHg (08/18 0812) SpO2:  [99 %-100 %] 100 % (08/18 0812) Last BM Date: 07/01/15 Filed Weights   06/30/15 1921 06/30/15 2355  Weight: 180 lb (81.647 kg) 179 lb 6.4 oz (81.375 kg)   General: looks well.  comfortable   Heart: RRR Chest: clear bil.  Abdomen: soft, NT, ND.  Active BS  Extremities: no CCE Neuro/Psych:  Pleasant, oriented x 3.  Cooperative and fully engaged.   Intake/Output from previous day: 08/17 0701 - 08/18 0700 In: 1142 [P.O.:442; IV Piggyback:700] Out: -   Intake/Output this shift:    Lab Results:  Recent Labs  07/02/15 0832 07/03/15 0525 07/04/15 0604  WBC 8.4 13.8* 9.8  HGB 15.9 16.3 15.6  HCT 46.1 47.4 45.4  PLT 192 187 188   BMET  Recent Labs  07/02/15 0832 07/03/15 0525 07/04/15 0604  NA 136 137 136  K 4.2 4.3 4.1  CL 103 101 103  CO2 22 25 24   GLUCOSE 114* 108* 85  BUN 14 18 21*  CREATININE 1.14 0.99 0.99  CALCIUM 9.5 9.6 8.8*   LFT  Recent Labs  07/02/15 0832 07/03/15 0525 07/04/15 0604  PROT 7.2 7.2 6.2*  ALBUMIN 4.4 4.1 3.6  AST 51* 32 26  ALT 26 24 22   ALKPHOS 64 65 56  BILITOT 1.8* 1.2 1.6*   PT/INR No results for input(s): LABPROT, INR in the last 72 hours. Hepatitis Panel No results for input(s): HEPBSAG, HCVAB, HEPAIGM, HEPBIGM in the last 72 hours.  Studies/Results: Nm Hepato W/eject Fract  07/03/2015   CLINICAL DATA:  Abnormal liver function tests with nausea, vomiting and  right upper quadrant pain.  EXAM: NUCLEAR MEDICINE HEPATOBILIARY IMAGING WITH GALLBLADDER EF  TECHNIQUE: Sequential images of the abdomen were obtained out to 60 minutes following intravenous administration of radiopharmaceutical. After slow intravenous infusion of 1.63 micrograms Cholecystokinin, gallbladder ejection fraction was determined.  RADIOPHARMACEUTICALS:  5.0 mCi Tc-44m Choletec IV  COMPARISON:  CT abdomen pelvis 07/01/2015.  FINDINGS: There is prompt and uniform uptake in the liver. Gallbladder is visualized at 1 minute. At 45 min, normal ejection fraction is greater than 40%.  IMPRESSION: Normal study.   Electronically Signed   By: Lorin Picket M.D.   On: 07/03/2015 14:34    ASSESMENT:   *  Upper abd pain, n/v: resolved.   Transitory minor bump of t bil and AST.  Normal HIDA 8/17.   For EGD today (EGD 11/2013 for similar pain and weight loss: antral gastritis).  He smokes pot daily, so ? Cyclic nausea related to [facility]?      PLAN   *  EGD today.   *  Printed up 2 web based article regarding cannabinoid hyperemesis syndrome for pt to read.  He says he does not plan to resume smoking pot.  Azucena Freed  07/04/2015, 10:01 AM Pager: [phone]

## 2015-07-04 NOTE — Progress Notes (Signed)
EGD is negative.  Pain, nausea and vomiting are gone.  Etiology for symptoms has not been determined - ? Gastroenteritis. OK to resume diet and discharge.

## 2015-07-04 NOTE — Discharge Summary (Signed)
[name], is a 74 y.o. male  DOB 1941/09/01  MRN [id number].  Admission date:  06/30/2015  Admitting Physician  Ivor Costa, MD  Discharge Date:  07/04/2015   Primary MD  Cathlean Cower, MD  Recommendations for primary care physician for things to follow:  - CBC, BMP during next visit   Admission Diagnosis  Intractable vomiting with nausea, vomiting of unspecified type [R11.10]   Discharge Diagnosis  Intractable vomiting with nausea, vomiting of unspecified type [R11.10]   Principal Problem:   Abdominal pain Active Problems:   Sick sinus syndrome   Hyperlipidemia   Depression   Essential hypertension   Atrial fibrillation   BPH (benign prostatic hyperplasia)   Cervical radiculopathy   Varicose vein of leg   Coronary artery calcification seen on CAT scan   Lumbar back pain   Nausea and vomiting   Alcohol abuse      Past Medical History  Diagnosis Date  . Hypertension   . Hyperlipidemia   . BPH (benign prostatic hyperplasia)   . Diverticulosis of colon (without mention of hemorrhage)   . Esophageal reflux   . Abdominal pain, epigastric   . Depressive disorder, not elsewhere classified   . Lumbago   . Allergic rhinitis, cause unspecified   . 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 Persistent atrial fibrillation   . Chronic anticoagulation     xarelto  . High risk medication use   . Paralyzed hemidiaphragm     LEFT  . Coronary artery calcification seen on CAT scan 12/26/2012  . Arthritis   . Pacemaker   . Hyperplastic rectal polyp   . CHF (congestive heart failure)     Past Surgical History  Procedure Laterality Date  . Insert / replace / remove pacemaker  05/2009     [facility] scientific ALTRUA 60  (serial number [id number]) dual-chamber pacemaker  . Pacemaker placement Bilateral     '05  . Esophagogastroduodenoscopy N/A 12/05/2013    Procedure:  ESOPHAGOGASTRODUODENOSCOPY (EGD);  Surgeon: [name], MD;  Location: [facility];  Service: Endoscopy;  Laterality: N/A;       History of present illness and  Hospital Course:     Kindly see H&P for history of present illness and admission details, please review complete Labs, Consult reports and Test reports for all details in brief  HPI  from the history and physical done on the day of admission on 07/01/2015  [name] is a 74 y.o. male with PMH of hypertension, hyperlipidemia, GERD, sick sinus syndrome, pacemaker placement, BPH, chronic back pain, PAF on Xarelto, paralysis of hemidiaphragm, CAD, who presents with nausea, vomiting and abdominal pain.  Patient reports that he started having nausea, vomiting and diffused abdominal pain at 3:30 PM yesterday when he was in church. He reports that he has not been feeling well since last night. His abdominal pain is severe, constant, nonradiating. It is not aggravated or alleviated by any known factors. He  vomited more than 20 times, no blood in the vomitus per patient. He does not have diarrhea, fever or chills. He had a tiny amount of bowel movement in ED. He does not have chest pain, cough, shortness breath, symptoms of UTI, unilateral weakness.  In ED, patient was found to have the VAC 9.3, temperature 99.6, no tachycardia, electrolytes okay, lipase 29. X-ray of acute abdomen/pelvis is negative. Patient is admitted to inpatient for further evaluation and treatment.  Hospital Course   Abd pain/Nausea/Vomiting -severe heartburn like symptoms, already on PPI -CT abd normal, labs unremarkable, troponin and UA unremarkable too -EGD 1/15 with mild gastritis, repeat EGD 07/04/2015 with normal appearing esophagus, stomach and didn't. - Normal high-dose - Was empirically on IV Cipro and Flagyl, stopped on discharge is no indication - Unclear etiology for his symptoms, but appears to be significantly subsided during hospital stay, tolerating a  regular diet.  Sick sinus syndrome: -has Psychologist, forensic.  -on Flecainide  HLD: Last LDL was 104 on 01/01/15. -Continue home medications: Lipitor  Atrial Fibrillation: CHA2DS2-VASc Score is 3, -Heart rate is well controlled. -continue Xarelto and metoprolol  HTN: -Continue amlodipine, hold lisinopril, -use parameters  BPH: stable - Continue Flomax/oxybutynin     Discharge Condition:  stable   Follow UP  Follow-up Information    Schedule an appointment as soon as possible for a visit with Cathlean Cower, MD.   Specialties:  Internal Medicine, Radiology   Why:  Posthospitalization follow-up   Contact information:   [place] [id number] [phone]         Discharge Instructions  and  Discharge Medications     Discharge Instructions    Diet - low sodium heart healthy    Complete by:  As directed      Discharge instructions    Complete by:  As directed   Follow with Primary MD Cathlean Cower, MD in 7 days   Get CBC, CMP, 2 view Chest X ray checked  by Primary MD next visit.    Activity: As tolerated with Full fall precautions use walker/cane & assistance as needed   Disposition Home **   Diet: Heart Healthy ** , with feeding assistance and aspiration precautions.  For Heart failure patients - Check your Weight same time everyday, if you gain over 2 pounds, or you develop in leg swelling, experience more shortness of breath or chest pain, call your Primary MD immediately. Follow Cardiac Low Salt Diet and 1.5 lit/day fluid restriction.   On your next visit with your primary care physician please Get Medicines reviewed and adjusted.   Please request your Prim.MD to go over all Hospital Tests and Procedure/Radiological results at the follow up, please get all Hospital records sent to your Prim MD by signing hospital release before you go home.   If you experience worsening of your admission symptoms, develop shortness of breath, life  threatening emergency, suicidal or homicidal thoughts you must seek medical attention immediately by calling 911 or calling your MD immediately  if symptoms less severe.  You Must read complete instructions/literature along with all the possible adverse reactions/side effects for all the Medicines you take and that have been prescribed to you. Take any new Medicines after you have completely understood and accpet all the possible adverse reactions/side effects.   Do not drive, operating heavy machinery, perform activities at heights, swimming or participation in water activities or provide baby sitting services if your were admitted for syncope or  siezures until you have seen by Primary MD or a Neurologist and advised to do so again.  Do not drive when taking Pain medications.    Do not take more than prescribed Pain, Sleep and Anxiety Medications  Special Instructions: If you have smoked or chewed Tobacco  in the last 2 yrs please stop smoking, stop any regular Alcohol  and or any Recreational drug use.  Wear Seat belts while driving.   Please note  You were cared for by a hospitalist during your hospital stay. If you have any questions about your discharge medications or the care you received while you were in the hospital after you are discharged, you can call the unit and asked to speak with the hospitalist on call if the hospitalist that took care of you is not available. Once you are discharged, your primary care physician will handle any further medical issues. Please note that NO REFILLS for any discharge medications will be authorized once you are discharged, as it is imperative that you return to your primary care physician (or establish a relationship with a primary care physician if you do not have one) for your aftercare needs so that they can reassess your need for medications and monitor your lab values.     Increase activity slowly    Complete by:  As directed               Medication List    TAKE these medications        amLODipine 10 MG tablet  Commonly known as:  NORVASC  TAKE 1 TABLET BY MOUTH EVERY DAY     atorvastatin 40 MG tablet  Commonly known as:  LIPITOR  TAKE 1 TABLET BY MOUTH DAILY     [employer] ALLERGY RELIEF 180 MG tablet  Generic drug:  fexofenadine  TAKE 1 TABLET (180 MG TOTAL) BY MOUTH DAILY.     flecainide 100 MG tablet  Commonly known as:  TAMBOCOR  TAKE 1 TABLET BY MOUTH TWICE A DAY     fluticasone 50 MCG/ACT nasal spray  Commonly known as:  FLONASE  USE 2 SPRAYS IN EACH NOSTRIL ONCE A DAY     latanoprost 0.005 % ophthalmic solution  Commonly known as:  XALATAN  Place 1 drop into both eyes daily.     lisinopril 10 MG tablet  Commonly known as:  PRINIVIL,ZESTRIL  TAKE 1 TABLET BY MOUTH EVERY MORNING     omeprazole 20 MG capsule  Commonly known as:  PRILOSEC  TAKE ONE CAPSULE TWICE A DAY     omeprazole 20 MG capsule  Commonly known as:  PRILOSEC  TAKE ONE CAPSULE BY MOUTH TWICE A DAY     oxybutynin 10 MG 24 hr tablet  Commonly known as:  DITROPAN-XL  TAKE 1 TABLET (10 MG TOTAL) BY MOUTH DAILY.     tamsulosin 0.4 MG Caps capsule  Commonly known as:  FLOMAX  TAKE ONE CAPSULE BY MOUTH TWICE A DAY     traMADol 50 MG tablet  Commonly known as:  ULTRAM  TAKE 1 TABLET EVERY 6 HOURS AS NEEDED FOR PAIN     TYLENOL ARTHRITIS PAIN 650 MG CR tablet  Generic drug:  acetaminophen  Take 650 mg by mouth every 8 (eight) hours as needed for pain.     XARELTO 20 MG Tabs tablet  Generic drug:  rivaroxaban  TAKE 1 TABLET BY MOUTH EVERY DAY          Diet and Activity recommendation:  See Discharge Instructions above   Consults obtained - GI   Major procedures and Radiology Reports - PLEASE review detailed and final reports for all details, in brief -   EGD on 07/04/15 showing normal esophagus, stomach and duodenum   Ct Abdomen Pelvis W Contrast  07/01/2015   CLINICAL DATA:  Nausea, vomiting and diffuse abdominal pain  since yesterday. History of hypertension, pacemaker chronic back pain. No previous relevant abdominal surgery. Initial encounter.  EXAM: CT ABDOMEN AND PELVIS WITH CONTRAST  TECHNIQUE: Multidetector CT imaging of the abdomen and pelvis was performed using the standard protocol following bolus administration of intravenous contrast.  CONTRAST:  164mL OMNIPAQUE IOHEXOL 300 MG/ML  SOLN  COMPARISON:  Radiographs 06/30/2015.  CT 11/17/2013.  FINDINGS: Lower chest: Stable left basilar scarring or atelectasis. Cardiac pacemaker noted. There is no pleural or pericardial effusion.  Hepatobiliary: Stable scattered calcified granulomas. The liver otherwise appears unremarkable. No evidence of gallstones, gallbladder wall thickening or biliary dilatation.  Pancreas: Unremarkable. No pancreatic ductal dilatation or surrounding inflammatory changes.  Spleen: Normal in size without focal abnormality.  Adrenals/Urinary Tract: Both adrenal glands appear normal. Small nonobstructing left renal calculi. No evidence hydronephrosis or ureteral calculus. The kidneys otherwise appear unremarkable. No bladder abnormality seen.  Stomach/Bowel: No evidence of bowel wall thickening, distention or surrounding inflammatory change. Moderate diverticular changes throughout the sigmoid colon. The appendix appears normal.  Vascular/Lymphatic: There are no enlarged abdominal or pelvic lymph nodes. Stable moderate aortoiliac atherosclerosis. There are stable scattered retroperitoneal calcifications within the left upper quadrant.  Reproductive: Stable prostatic enlargement. The seminal vesicles appear unremarkable.  Other: No evidence of abdominal wall mass or hernia.  Musculoskeletal: No acute or significant osseous findings. Stable lower lumbar spondylosis.  IMPRESSION: 1. No acute findings or explanation for the patient's symptoms. 2. Diverticulosis without evidence of acute inflammation. 3. Nonobstructing left renal calculi.   Electronically  Signed   By: Richardean Sale M.D.   On: 07/01/2015 07:15   Nm Hepato W/eject Fract  07/03/2015   CLINICAL DATA:  Abnormal liver function tests with nausea, vomiting and right upper quadrant pain.  EXAM: NUCLEAR MEDICINE HEPATOBILIARY IMAGING WITH GALLBLADDER EF  TECHNIQUE: Sequential images of the abdomen were obtained out to 60 minutes following intravenous administration of radiopharmaceutical. After slow intravenous infusion of 1.63 micrograms Cholecystokinin, gallbladder ejection fraction was determined.  RADIOPHARMACEUTICALS:  5.0 mCi Tc-7m Choletec IV  COMPARISON:  CT abdomen pelvis 07/01/2015.  FINDINGS: There is prompt and uniform uptake in the liver. Gallbladder is visualized at 1 minute. At 45 min, normal ejection fraction is greater than 40%.  IMPRESSION: Normal study.   Electronically Signed   By: Lorin Picket M.D.   On: 07/03/2015 14:34   Dg Abd Acute W/chest  06/30/2015   CLINICAL DATA:  Nausea and vomiting  EXAM: DG ABDOMEN ACUTE W/ 1V CHEST  COMPARISON:  11/17/2013.  FINDINGS: Cardiac shadow is within normal limits. A pacing device is seen. The lungs are clear bilaterally. Mild elevation of left hemidiaphragm is seen.  Scattered large and small bowel gas is noted. No obstructive changes are seen. Degenerative changes of lumbar spine are noted. No free air is seen. Multiple mesenteric calcifications are again identified throughout the abdomen.  IMPRESSION: No acute abnormality noted.   Electronically Signed   By: Inez Catalina M.D.   On: 06/30/2015 20:35    Micro Results     Recent Results (from the past 240 hour(s))  Culture, blood (x 2)     Status:  None (Preliminary result)   Collection Time: 07/01/15  3:20 AM  Result Value Ref Range Status   Specimen Description BLOOD LEFT HAND  Final   Special Requests BOTTLES DRAWN AEROBIC ONLY 5CC  Final   Culture NO GROWTH 3 DAYS  Final   Report Status PENDING  Incomplete  Culture, blood (x 2)     Status: None (Preliminary result)    Collection Time: 07/01/15  3:34 AM  Result Value Ref Range Status   Specimen Description BLOOD RIGHT HAND  Final   Special Requests IN PEDIATRIC BOTTLE 1CC  Final   Culture NO GROWTH 3 DAYS  Final   Report Status PENDING  Incomplete  Culture, Urine     Status: None   Collection Time: 07/01/15  4:17 PM  Result Value Ref Range Status   Specimen Description URINE, RANDOM  Final   Special Requests NONE  Final   Culture 2,000 COLONIES/mL INSIGNIFICANT GROWTH  Final   Report Status 07/03/2015 FINAL  Final       Today   Subjective:   [name] today has no headache,no chest abdominal pain,no new weakness tingling or numbness, feels much better wants to go home today.  Objective:   Blood pressure 101/72, pulse 59, temperature 98.4 F (36.9 C), temperature source Oral, resp. rate 16, height 5\' 9"  (1.753 m), weight 81.375 kg (179 lb 6.4 oz), SpO2 99 %.   Intake/Output Summary (Last 24 hours) at 07/04/15 1507 Last data filed at 07/04/15 1405  Gross per 24 hour  Intake 1693.67 ml  Output      0 ml  Net 1693.67 ml    Exam Awake Alert, Oriented x 3, No new F.N deficits, Normal affect [place].AT,PERRAL Supple Neck,No JVD, No cervical lymphadenopathy appriciated.  Symmetrical Chest wall movement, Good air movement bilaterally, CTAB RRR,No Gallops,Rubs or new Murmurs, No Parasternal Heave +ve B.Sounds, Abd Soft, Non tender, No organomegaly appriciated, No rebound -guarding or rigidity. No Cyanosis, Clubbing or edema, No new Rash or bruise  Data Review   CBC w Diff: Lab Results  Component Value Date   WBC 9.8 07/04/2015   HGB 15.6 07/04/2015   HCT 45.4 07/04/2015   PLT 188 07/04/2015   LYMPHOPCT 13 06/30/2015   MONOPCT 6 06/30/2015   EOSPCT 0 06/30/2015   BASOPCT 0 06/30/2015    CMP: Lab Results  Component Value Date   NA 136 07/04/2015   K 4.1 07/04/2015   CL 103 07/04/2015   CO2 24 07/04/2015   BUN 21* 07/04/2015   CREATININE 0.99 07/04/2015   PROT 6.2* 07/04/2015    ALBUMIN 3.6 07/04/2015   BILITOT 1.6* 07/04/2015   ALKPHOS 56 07/04/2015   AST 26 07/04/2015   ALT 22 07/04/2015  .   Total Time in preparing paper work, data evaluation and todays exam - 35 minutes  Allix Blomquist M.D on 07/04/2015 at 3:07 PM  [facility]   Office  [phone]

## 2015-07-04 NOTE — Interval H&P Note (Signed)
History and Physical Interval Note:  07/04/2015 11:40 AM  [name]  has presented today for surgery, with the diagnosis of abdominal pain and nausea.  The various methods of treatment have been discussed with the patient and family. After consideration of risks, benefits and other options for treatment, the patient has consented to  Procedure(s): ESOPHAGOGASTRODUODENOSCOPY (EGD) (N/A) as a surgical intervention .  The patient's history has been reviewed, patient examined, no change in status, stable for surgery.  I have reviewed the patient's chart and labs.  Questions were answered to the patient's satisfaction.    The recent H&P (dated *07/03/15**) was reviewed, the patient was examined and there is no change in the patients condition since that H&P was completed.   Erskine Emery  07/04/2015, 11:40 AM    Erskine Emery

## 2015-07-04 NOTE — Op Note (Signed)
[facility] [place], [address]   ENDOSCOPY PROCEDURE REPORT  PATIENT: [name], [name]  MR#: [id number] BIRTHDATE: 1941/03/23 , 74  yrs. old GENDER: male ENDOSCOPIST: [name], MD REFERRED BY: PROCEDURE DATE:  07/04/2015 PROCEDURE:  EGD, diagnostic ASA CLASS:     Class II INDICATIONS:  epigastric pain, nausea, and vomiting. MEDICATIONS: Versed 5.5 mg IV and Fentanyl 50 mcg IV TOPICAL ANESTHETIC: Cetacaine Spray  DESCRIPTION OF PROCEDURE: After the risks benefits and alternatives of the procedure were thoroughly explained, informed consent was obtained.  The Pentax Gastroscope [id number] endoscope was introduced through the mouth and advanced to the second portion of the duodenum , Without limitations.  The instrument was slowly withdrawn as the mucosa was fully examined.      EXAM: The esophagus and gastroesophageal junction were completely normal in appearance.  The stomach was entered and closely examined.The antrum, angularis, and lesser curvature were well visualized, including a retroflexed view of the cardia and fundus. The stomach wall was normally distensable.  The scope passed easily through the pylorus into the duodenum.  Retroflexed views revealed no abnormalities.     The scope was then withdrawn from the patient and the procedure completed.  COMPLICATIONS: There were no immediate complications.  ENDOSCOPIC IMPRESSION: Normal appearing esophagus and GE junction, the stomach was well visualized and normal in appearance, normal appearing duodenum  RECOMMENDATIONS: No further GI workup  REPEAT EXAM:  eSigned:  [name], MD 07/04/2015 12:06 PM    CC:

## 2015-07-04 NOTE — Care Management Important Message (Signed)
Important Message  Patient Details  Name: [name] MRN: [id number] Date of Birth: 1941/07/21   Medicare Important Message Given:  Yes-second notification given    Nathen May 07/04/2015, 12:52 [place] Message  Patient Details  Name: [name] MRN: [id number] Date of Birth: 20-Sep-1941   Medicare Important Message Given:  Yes-second notification given    Nathen May 07/04/2015, 12:52 PM

## 2015-07-04 NOTE — Discharge Instructions (Signed)
Follow with Primary MD James John, MD in 7 days  ° °Get CBC, CMP, 2 view Chest X ray checked  by Primary MD next visit.  ° ° °Activity: As tolerated with Full fall precautions use walker/cane & assistance as needed ° ° °Disposition Home  ° ° °Diet: Heart Healthy , with feeding assistance and aspiration precautions. ° °For Heart failure patients - Check your Weight same time everyday, if you gain over 2 pounds, or you develop in leg swelling, experience more shortness of breath or chest pain, call your Primary MD immediately. Follow Cardiac Low Salt Diet and 1.5 lit/day fluid restriction. ° ° °On your next visit with your primary care physician please Get Medicines reviewed and adjusted. ° ° °Please request your Prim.MD to go over all Hospital Tests and Procedure/Radiological results at the follow up, please get all Hospital records sent to your Prim MD by signing hospital release before you go home. ° ° °If you experience worsening of your admission symptoms, develop shortness of breath, life threatening emergency, suicidal or homicidal thoughts you must seek medical attention immediately by calling 911 or calling your MD immediately  if symptoms less severe. ° °You Must read complete instructions/literature along with all the possible adverse reactions/side effects for all the Medicines you take and that have been prescribed to you. Take any new Medicines after you have completely understood and accpet all the possible adverse reactions/side effects.  ° °Do not drive, operating heavy machinery, perform activities at heights, swimming or participation in water activities or provide baby sitting services if your were admitted for syncope or siezures until you have seen by Primary MD or a Neurologist and advised to do so again. ° °Do not drive when taking Pain medications.  ° ° °Do not take more than prescribed Pain, Sleep and Anxiety Medications ° °Special Instructions: If you have smoked or chewed Tobacco  in the  last 2 yrs please stop smoking, stop any regular Alcohol  and or any Recreational drug use. ° °Wear Seat belts while driving. ° ° °Please note ° °You were cared for by a hospitalist during your hospital stay. If you have any questions about your discharge medications or the care you received while you were in the hospital after you are discharged, you can call the unit and asked to speak with the hospitalist on call if the hospitalist that took care of you is not available. Once you are discharged, your primary care physician will handle any further medical issues. Please note that NO REFILLS for any discharge medications will be authorized once you are discharged, as it is imperative that you return to your primary care physician (or establish a relationship with a primary care physician if you do not have one) for your aftercare needs so that they can reassess your need for medications and monitor your lab values. ° °

## 2015-07-04 NOTE — H&P (View-Only) (Signed)
Daily Rounding Note  07/03/2015, 10:07 AM  LOS: 2 days   SUBJECTIVE:       Pain resolved.  Last anaelgesic was acetaminophen at 0730 yesterday.  No nausea.  OBJECTIVE:         Vital signs in last 24 hours:    Temp:  [99.4 F (37.4 C)-100 F (37.8 C)] 99.4 F (37.4 C) (08/17 0537) Pulse Rate:  [60-66] 61 (08/17 0537) Resp:  [18-20] 18 (08/17 0537) BP: (123-136)/(68-86) 123/73 mmHg (08/17 0537) SpO2:  [99 %-100 %] 100 % (08/17 0537) Last BM Date: 07/01/15 Filed Weights   06/30/15 1921 06/30/15 2355  Weight: 180 lb (81.647 kg) 179 lb 6.4 oz (81.375 kg)   General: alert, comfortable   Heart: RRR Chest: clear bil Abdomen: soft, NT, ND.  Active BS.   Extremities: no CCE Neuro/Psych:  Oriented x 3.  Less anxious/more relaxed than yesterday.    Intake/Output from previous day: 08/16 0701 - 08/17 0700 In: 1212 [P.O.:920; I.V.:292] Out: -   Intake/Output this shift:    Lab Results:  Recent Labs  06/30/15 1948 07/02/15 0832 07/03/15 0525  WBC 9.3 8.4 13.8*  HGB 14.7 15.9 16.3  HCT 43.0 46.1 47.4  PLT 196 192 187   BMET  Recent Labs  06/30/15 1948 07/02/15 0832 07/03/15 0525  NA 136 136 137  K 4.1 4.2 4.3  CL 105 103 101  CO2 19* 22 25  GLUCOSE 123* 114* 108*  BUN 17 14 18   CREATININE 1.10 1.14 0.99  CALCIUM 9.4 9.5 9.6   LFT  Recent Labs  06/30/15 1948 07/02/15 0832 07/03/15 0525  PROT 7.6 7.2 7.2  ALBUMIN 4.6 4.4 4.1  AST 32 51* 32  ALT 22 26 24   ALKPHOS 61 64 65  BILITOT 0.9 1.8* 1.2   PT/INR  Recent Labs  07/01/15 0320  LABPROT 17.7*  INR 1.45   Hepatitis Panel No results for input(s): HEPBSAG, HCVAB, HEPAIGM, HEPBIGM in the last 72 hours.  Studies/Results: No results found.   Scheduled Meds: . amLODipine  10 mg Oral Daily  . atorvastatin  40 mg Oral Daily  . ciprofloxacin  400 mg Intravenous Q12H  . flecainide  100 mg Oral BID  . fluticasone  2 spray Each Nare  Daily  . latanoprost  1 drop Both Eyes Daily  . metronidazole  500 mg Intravenous Q8H  . oxybutynin  10 mg Oral QHS  . pantoprazole (PROTONIX) IV  40 mg Intravenous Q12H  . rivaroxaban  20 mg Oral Q supper  . sodium chloride  3 mL Intravenous Q12H  . tamsulosin  0.4 mg Oral BID   Continuous Infusions: . sodium chloride 1,000 mL (07/02/15 1606)   PRN Meds:.acetaminophen, gi cocktail, lip balm, morphine injection, MUSCLE RUB, ondansetron (ZOFRAN) IV, promethazine   ASSESMENT:   * N/V/upper abdominal pain. Acute onset.  Transitory, minor elevation of total bili and AST. THC+, patient admits to daily marijuana smoking. CT scan shows only uncomplicated diverticulosis. Rule out poorly controlled GERD. Rule out occult biliary disease. Rule out ulcer disease. Rule out viral gastroenteritis. Sxs improved over last 24 hours. On IV q12 hour PPI, on Omeprazole 20 BID at home (not sure he is taking as Rx'd)   *  New leukocytosis. T max today 100.0.      PLAN   *  HIDA scan today. EGD set for 11 AM tomorrow.     Azucena Freed  07/03/2015, 10:07 AM  Pager: [phone]

## 2015-07-04 NOTE — Care Management Note (Signed)
Case Management Note  Patient Details  Name: [name] MRN: [id number] Date of Birth: 09-16-1941  Subjective/Objective:     Patient is for dc today, no needs.               Action/Plan:   Expected Discharge Date:                  Expected Discharge Plan:  Home/Self Care  In-House Referral:     Discharge planning Services  CM Consult  Post Acute Care Choice:    Choice offered to:     DME Arranged:    DME Agency:     HH Arranged:    [place] Agency:     Status of Service:  Completed, signed off  Medicare Important Message Given:  Yes-second notification given Date Medicare IM Given:    Medicare IM give by:    Date Additional Medicare IM Given:    Additional Medicare Important Message give by:     If discussed at [place] of Stay Meetings, dates discussed:    Additional Comments:  Zenon Mayo, RN 07/04/2015, 3:36 PM

## 2015-07-05 ENCOUNTER — Encounter (HOSPITAL_COMMUNITY): Payer: Self-pay | Admitting: Gastroenterology

## 2015-07-05 ENCOUNTER — Telehealth: Payer: Self-pay | Admitting: *Deleted

## 2015-07-05 NOTE — Telephone Encounter (Signed)
Transition Care Management Follow-up Telephone Call   Date discharged? 07/04/15   How have you been since you were released from the hospital? Pt states he is feeling alright   Do you understand why you were in the hospital? YES   Do you understand the discharge instructions? YES   Where were you discharged to? Home   Items Reviewed:  Medications reviewed: YES  Allergies reviewed: YES  Dietary changes reviewed: YES  Referrals reviewed: No referral needed   Functional Questionnaire:   Activities of Daily Living (ADLs):   He states he are independent in the following: ambulation, bathing and hygiene, feeding, continence, grooming, toileting and dressing States he doesn't require assistance    Any transportation issues/concerns?: NO   Any patient concerns? NO   Confirmed importance and date/time of follow-up visits scheduled YES, appt 07/11/15  Provider Appointment booked with Dr. Jenny Reichmann  Confirmed with patient if condition begins to worsen call PCP or go to the ER.  Patient was given the office number and encouraged to call back with question or concerns.  : YES

## 2015-07-06 LAB — CULTURE, BLOOD (ROUTINE X 2)
Culture: NO GROWTH
Culture: NO GROWTH

## 2015-07-11 ENCOUNTER — Encounter: Payer: Self-pay | Admitting: Internal Medicine

## 2015-07-11 ENCOUNTER — Ambulatory Visit (INDEPENDENT_AMBULATORY_CARE_PROVIDER_SITE_OTHER): Payer: Medicare Other | Admitting: Internal Medicine

## 2015-07-11 VITALS — BP 120/76 | HR 60 | Temp 98.0°F | Ht 69.0 in | Wt 180.0 lb

## 2015-07-11 DIAGNOSIS — G8929 Other chronic pain: Secondary | ICD-10-CM

## 2015-07-11 DIAGNOSIS — R112 Nausea with vomiting, unspecified: Secondary | ICD-10-CM | POA: Diagnosis not present

## 2015-07-11 DIAGNOSIS — I10 Essential (primary) hypertension: Secondary | ICD-10-CM

## 2015-07-11 MED ORDER — TRAMADOL HCL 50 MG PO TABS: 50.0000 mg | ORAL_TABLET | Freq: Four times a day (QID) | ORAL | Status: DC | PRN

## 2015-07-11 NOTE — Assessment & Plan Note (Signed)
Ok to hold further BP meds until BP at home > 140s, likely to occur in the next 1-2wks,  to f/u any worsening symptoms or concerns BP Readings from Last 3 Encounters:  07/11/15 120/76  07/04/15 101/72  01/21/15 140/82

## 2015-07-11 NOTE — Assessment & Plan Note (Signed)
Etiology unclear, no further symptoms, exam benign, cont same tx with BID PPI

## 2015-07-11 NOTE — Assessment & Plan Note (Signed)
Primarily DJD hands,  Neck, knees - for tramadol prn,  to f/u any worsening symptoms or concerns

## 2015-07-11 NOTE — Patient Instructions (Addendum)
Please continue all other medications as before, and refills have been done if requested - the tramadol  OK to restart your BP medicines when the BP is several times more than 140 on the top number  Please have the pharmacy call with any other refills you may need.  Please continue your efforts at being more active, low cholesterol diet, and weight control.  You are otherwise up to date with prevention measures today.  Please keep your appointments with your specialists as you may have planned  Please return in 3 months, or sooner if needed

## 2015-07-11 NOTE — Progress Notes (Signed)
Subjective:    Patient ID: [name], male    DOB: 1941-03-23, 74 y.o.   MRN: [id number]  HPI  Here to fu recent hospn for episode intractable n/v, seen per GI, neg GB evaluation, EGD normal, symptoms just improved and d/c to home aug 18.  Was taking his BP meds for the first days, but not taken any for the last 3 days due to too low BP less than 100.  Has overall felt better last 3 days, and BP 120/76 for the first time since left hospital.   Tramadol working for ongoing pain well, needs refill. Denies worsening reflux, abd pain, dysphagia, n/v, bowel change or blood, though feels some "rolling feeling" occasionally.   Past Medical History  Diagnosis Date  . Hypertension   . Hyperlipidemia   . BPH (benign prostatic hyperplasia)   . Diverticulosis of colon (without mention of hemorrhage)   . Esophageal reflux   . Abdominal pain, epigastric   . Depressive disorder, not elsewhere classified   . Lumbago   . Allergic rhinitis, cause unspecified   . 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 Persistent atrial fibrillation   . Chronic anticoagulation     xarelto  . High risk medication use   . Paralyzed hemidiaphragm     LEFT  . Coronary artery calcification seen on CAT scan 12/26/2012  . Arthritis   . Pacemaker   . Hyperplastic rectal polyp   . CHF (congestive heart failure)    Past Surgical History  Procedure Laterality Date  . Insert / replace / remove pacemaker  05/2009     [facility] scientific ALTRUA 60  (serial number [id number]) dual-chamber pacemaker  . Pacemaker placement Bilateral     '05  . Esophagogastroduodenoscopy N/A 12/05/2013    Procedure: ESOPHAGOGASTRODUODENOSCOPY (EGD);  Surgeon: [name], MD;  Location: [facility];  Service: Endoscopy;  Laterality: N/A;  . Esophagogastroduodenoscopy N/A 07/04/2015    Procedure: ESOPHAGOGASTRODUODENOSCOPY (EGD);  Surgeon: [name], MD;  Location: [place];  Service: Endoscopy;  Laterality:  N/A;    reports that he quit smoking about 36 years ago. His smoking use included Cigarettes. He has a 12.5 pack-year smoking history. He has quit using smokeless tobacco. His smokeless tobacco use included Chew. He reports that he does not drink alcohol or use illicit drugs. family history includes Arthritis in his paternal grandmother; Asthma in his sister; Cancer in his father; Diabetes in his mother; Hypertension in his brother, daughter, father, mother, sister, and son; Lung cancer in his father. Allergies  Allergen Reactions  . Pantoprazole Sodium Itching   Current Outpatient Prescriptions on File Prior to Visit  Medication Sig Dispense Refill  . acetaminophen (TYLENOL ARTHRITIS PAIN) 650 MG CR tablet Take 650 mg by mouth every 8 (eight) hours as needed for pain.    Marland Kitchen atorvastatin (LIPITOR) 40 MG tablet TAKE 1 TABLET BY MOUTH DAILY 90 tablet 3  . [facility] ALLERGY RELIEF 180 MG tablet TAKE 1 TABLET (180 MG TOTAL) BY MOUTH DAILY. 90 tablet 3  . flecainide (TAMBOCOR) 100 MG tablet TAKE 1 TABLET BY MOUTH TWICE A DAY 180 tablet 3  . fluticasone (FLONASE) 50 MCG/ACT nasal spray USE 2 SPRAYS IN EACH NOSTRIL ONCE A DAY 16 g 5  . latanoprost (XALATAN) 0.005 % ophthalmic solution Place 1 drop into both eyes daily.    Marland Kitchen omeprazole (PRILOSEC) 20 MG capsule TAKE ONE CAPSULE TWICE A DAY (Patient taking  differently: TAKE ONE CAPSULE BY MOUTH TWICE A DAY) 60 capsule 4  . omeprazole (PRILOSEC) 20 MG capsule TAKE ONE CAPSULE BY MOUTH TWICE A DAY 60 capsule 4  . oxybutynin (DITROPAN-XL) 10 MG 24 hr tablet TAKE 1 TABLET (10 MG TOTAL) BY MOUTH DAILY. 90 tablet 3  . tamsulosin (FLOMAX) 0.4 MG CAPS capsule TAKE ONE CAPSULE BY MOUTH TWICE A DAY 60 capsule 11  . XARELTO 20 MG TABS tablet TAKE 1 TABLET BY MOUTH EVERY DAY 90 tablet 3  . amLODipine (NORVASC) 10 MG tablet TAKE 1 TABLET BY MOUTH EVERY DAY (Patient not taking: Reported on 07/11/2015) 90 tablet 3  . lisinopril (PRINIVIL,ZESTRIL) 10 MG tablet TAKE 1 TABLET BY  MOUTH EVERY MORNING (Patient not taking: Reported on 07/11/2015) 90 tablet 3  . [DISCONTINUED] furosemide (LASIX) 40 MG tablet Take 1 tablet (40 mg total) by mouth daily. 30 tablet 11   No current facility-administered medications on file prior to visit.   Review of Systems  Constitutional: Negative for unusual diaphoresis or night sweats HENT: Negative for ringing in ear or discharge Eyes: Negative for double vision or worsening visual disturbance.  Respiratory: Negative for choking and stridor.   Gastrointestinal: Negative for vomiting or other signifcant bowel change Genitourinary: Negative for hematuria or change in urine volume.  Musculoskeletal: Negative for other MSK pain or swelling Skin: Negative for color change and worsening wound.  Neurological: Negative for tremors and numbness other than noted  Psychiatric/Behavioral: Negative for decreased concentration or agitation other than above       Objective:   Physical Exam BP 120/76 mmHg  Pulse 60  Temp(Src) 98 F (36.7 C) (Oral)  Ht 5\' 9"  (1.753 m)  Wt 180 lb (81.647 kg)  BMI 26.57 kg/m2  SpO2 98% VS noted,  Constitutional: Pt appears in no significant distress HENT: Head: NCAT.  Right Ear: External ear normal.  Left Ear: External ear normal.  Eyes: . Pupils are equal, round, and reactive to light. Conjunctivae and EOM are normal Neck: Normal range of motion. Neck supple.  Cardiovascular: Normal rate and regular rhythm.   Pulmonary/Chest: Effort normal and breath sounds without rales or wheezing.  Abd:  Soft, NT, ND, + BS Neurological: Pt is alert. Not confused , motor grossly intact Skin: Skin is warm. No rash, no LE edema Psychiatric: Pt behavior is normal. No agitation.     Assessment & Plan:

## 2015-07-11 NOTE — Progress Notes (Signed)
Pre visit review using our clinic review tool, if applicable. No additional management support is needed unless otherwise documented below in the visit note. 

## 2015-08-02 ENCOUNTER — Other Ambulatory Visit: Payer: Self-pay | Admitting: Internal Medicine

## 2015-08-13 DIAGNOSIS — H2513 Age-related nuclear cataract, bilateral: Secondary | ICD-10-CM | POA: Diagnosis not present

## 2015-08-13 DIAGNOSIS — H4011X1 Primary open-angle glaucoma, mild stage: Secondary | ICD-10-CM | POA: Diagnosis not present

## 2015-08-26 ENCOUNTER — Other Ambulatory Visit: Payer: Self-pay | Admitting: Internal Medicine

## 2015-09-17 ENCOUNTER — Encounter: Payer: Self-pay | Admitting: Internal Medicine

## 2015-10-11 ENCOUNTER — Encounter: Payer: Medicare Other | Admitting: Internal Medicine

## 2015-10-15 ENCOUNTER — Encounter: Payer: Self-pay | Admitting: Internal Medicine

## 2015-10-15 ENCOUNTER — Telehealth: Payer: Self-pay | Admitting: *Deleted

## 2015-10-15 ENCOUNTER — Ambulatory Visit (INDEPENDENT_AMBULATORY_CARE_PROVIDER_SITE_OTHER): Payer: Medicare Other | Admitting: Internal Medicine

## 2015-10-15 VITALS — BP 124/72 | HR 62 | Temp 98.9°F | Ht 69.0 in | Wt 188.0 lb

## 2015-10-15 DIAGNOSIS — F32A Depression, unspecified: Secondary | ICD-10-CM

## 2015-10-15 DIAGNOSIS — F329 Major depressive disorder, single episode, unspecified: Secondary | ICD-10-CM | POA: Diagnosis not present

## 2015-10-15 DIAGNOSIS — N400 Enlarged prostate without lower urinary tract symptoms: Secondary | ICD-10-CM

## 2015-10-15 DIAGNOSIS — E785 Hyperlipidemia, unspecified: Secondary | ICD-10-CM | POA: Diagnosis not present

## 2015-10-15 DIAGNOSIS — I10 Essential (primary) hypertension: Secondary | ICD-10-CM

## 2015-10-15 DIAGNOSIS — K219 Gastro-esophageal reflux disease without esophagitis: Secondary | ICD-10-CM

## 2015-10-15 MED ORDER — OMEPRAZOLE 20 MG PO CPDR: 20.0000 mg | DELAYED_RELEASE_CAPSULE | Freq: Two times a day (BID) | ORAL | Status: DC

## 2015-10-15 NOTE — Assessment & Plan Note (Signed)
Not clear why, but does not seem to require BP meds fo rnow, cont to monitor  to f/u any worsening symptoms or concerns BP Readings from Last 3 Encounters:  10/15/15 124/72  07/11/15 120/76  07/04/15 101/72

## 2015-10-15 NOTE — Assessment & Plan Note (Signed)
stable overall by history and exam, recent data reviewed with pt, and pt to continue medical treatment as before,  to f/u any worsening symptoms or concerns Lab Results  Component Value Date   LDLCALC 104* 01/01/2015   For cont low chol diet, f/u labs next visit

## 2015-10-15 NOTE — Progress Notes (Signed)
Pre visit review using our clinic review tool, if applicable. No additional management support is needed unless otherwise documented below in the visit note. 

## 2015-10-15 NOTE — Assessment & Plan Note (Signed)
stable overall by history and exam, recent data reviewed with pt, and pt to continue medical treatment as before,  to f/u any worsening symptoms or concerns Lab Results  Component Value Date   WBC 9.8 07/04/2015   HGB 15.6 07/04/2015   HCT 45.4 07/04/2015   PLT 188 07/04/2015   GLUCOSE 85 07/04/2015   CHOL 185 01/01/2015   TRIG 79.0 01/01/2015   HDL 65.10 01/01/2015   LDLDIRECT 183.4 12/26/2013   LDLCALC 104* 01/01/2015   ALT 22 07/04/2015   AST 26 07/04/2015   NA 136 07/04/2015   K 4.1 07/04/2015   CL 103 07/04/2015   CREATININE 0.99 07/04/2015   BUN 21* 07/04/2015   CO2 24 07/04/2015   TSH 1.05 01/01/2015   PSA 1.72 01/01/2015   INR 1.45 07/01/2015

## 2015-10-15 NOTE — Assessment & Plan Note (Signed)
With ongoing nocturia 1-3 times per night, offered to refer back to urology but pt declines, to cont flomax bid

## 2015-10-15 NOTE — Telephone Encounter (Signed)
Spoke with patient today in reference to a prescription refill, he will see Dr. Havery Moros for his next office visit. Omeprazole DR 20mg  refilled for 3 months.

## 2015-10-15 NOTE — Patient Instructions (Signed)
Please continue all other medications as before, and refills have been done if requested.  Please have the pharmacy call with any other refills you may need.  Please continue your efforts at being more active, low cholesterol diet, and weight control.  You are otherwise up to date with prevention measures today.  Please keep your appointments with your specialists as you may have planned  Please return in 6 months, or sooner if needed 

## 2015-10-15 NOTE — Progress Notes (Signed)
Subjective:    Patient ID: [name], male    DOB: 1940/11/24, 74 y.o.   MRN: [id number]  HPI Here to f/u; overall doing ok,  Pt denies chest pain, increasing sob or doe, wheezing, orthopnea, PND, increased LE swelling, palpitations, dizziness or syncope.  Pt denies new neurological symptoms such as new headache, or facial or extremity weakness or numbness.  Pt denies polydipsia, polyuria, or low sugar episode.   Pt denies new neurological symptoms such as new headache, or facial or extremity weakness or numbness.   Pt states overall good compliance with meds, mostly trying to follow appropriate diet, with wt overall stable,  but little exercise however. N/V resolved from aug 2016 - no definitive etiology.  Does c/o nocturia up to 3 times per night for the last 2 months, on flomax bid, last visit with urologist with prn f/u only, last seen approx 1.5 yrs. Has some urgency, but Denies urinary symptoms such as dysuria, flank pain, hematuria or n/v, fever, chills.  Has not taken amlod or lsinopril since august, BP improved. Wt Readings from Last 3 Encounters:  10/15/15 188 lb (85.276 kg)  07/11/15 180 lb (81.647 kg)  06/30/15 179 lb 6.4 oz (81.375 kg)  Denies worsening depressive symptoms, suicidal ideation, or panic Past Medical History  Diagnosis Date  . Hypertension   . Hyperlipidemia   . BPH (benign prostatic hyperplasia)   . Diverticulosis of colon (without mention of hemorrhage)   . Esophageal reflux   . Abdominal pain, epigastric   . Depressive disorder, not elsewhere classified   . Lumbago   . Allergic rhinitis, cause unspecified   . 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 Persistent atrial fibrillation ([place])   . Chronic anticoagulation     xarelto  . High risk medication use   . Paralyzed hemidiaphragm     LEFT  . Coronary artery calcification seen on CAT scan 12/26/2012  . Arthritis   . Pacemaker   . Hyperplastic rectal polyp   . CHF  (congestive heart failure) [facility])    Past Surgical History  Procedure Laterality Date  . Insert / replace / remove pacemaker  05/2009     [facility] scientific ALTRUA 60  (serial number [id number]) dual-chamber pacemaker  . Pacemaker placement Bilateral     '05  . Esophagogastroduodenoscopy N/A 12/05/2013    Procedure: ESOPHAGOGASTRODUODENOSCOPY (EGD);  Surgeon: [name], MD;  Location: [facility];  Service: Endoscopy;  Laterality: N/A;  . Esophagogastroduodenoscopy N/A 07/04/2015    Procedure: ESOPHAGOGASTRODUODENOSCOPY (EGD);  Surgeon: [name], MD;  Location: [place];  Service: Endoscopy;  Laterality: N/A;    reports that he quit smoking about 36 years ago. His smoking use included Cigarettes. He has a 12.5 pack-year smoking history. He has quit using smokeless tobacco. His smokeless tobacco use included Chew. He reports that he does not drink alcohol or use illicit drugs. family history includes Arthritis in his paternal grandmother; Asthma in his sister; Cancer in his father; Diabetes in his mother; Hypertension in his brother, daughter, father, mother, sister, and son; Lung cancer in his father. Allergies  Allergen Reactions  . Pantoprazole Sodium Itching   Current Outpatient Prescriptions on File Prior to Visit  Medication Sig Dispense Refill  . acetaminophen (TYLENOL ARTHRITIS PAIN) 650 MG CR tablet Take 650 mg by mouth every 8 (eight) hours as needed for pain.    Marland Kitchen atorvastatin (LIPITOR) 40 MG tablet TAKE  1 TABLET BY MOUTH DAILY 90 tablet 3  . [employer] ALLERGY RELIEF 180 MG tablet TAKE 1 TABLET (180 MG TOTAL) BY MOUTH DAILY. 90 tablet 3  . flecainide (TAMBOCOR) 100 MG tablet TAKE 1 TABLET BY MOUTH TWICE A DAY 180 tablet 3  . fluticasone (FLONASE) 50 MCG/ACT nasal spray USE 2 SPRAYS IN EACH NOSTRIL ONCE A DAY 16 g 5  . latanoprost (XALATAN) 0.005 % ophthalmic solution Place 1 drop into both eyes daily.    Marland Kitchen omeprazole (PRILOSEC) 20 MG capsule TAKE ONE CAPSULE BY MOUTH TWICE A  DAY 60 capsule 4  . oxybutynin (DITROPAN-XL) 10 MG 24 hr tablet TAKE 1 TABLET (10 MG TOTAL) BY MOUTH DAILY. 90 tablet 1  . tamsulosin (FLOMAX) 0.4 MG CAPS capsule TAKE ONE CAPSULE BY MOUTH TWICE A DAY 60 capsule 11  . traMADol (ULTRAM) 50 MG tablet Take 1 tablet (50 mg total) by mouth every 6 (six) hours as needed. for pain 120 tablet 5  . XARELTO 20 MG TABS tablet TAKE 1 TABLET BY MOUTH EVERY DAY 90 tablet 3  . amLODipine (NORVASC) 10 MG tablet TAKE 1 TABLET BY MOUTH EVERY DAY (Patient not taking: Reported on 10/15/2015) 90 tablet 3  . lisinopril (PRINIVIL,ZESTRIL) 10 MG tablet TAKE 1 TABLET BY MOUTH EVERY MORNING (Patient not taking: Reported on 07/11/2015) 90 tablet 3  . [DISCONTINUED] furosemide (LASIX) 40 MG tablet Take 1 tablet (40 mg total) by mouth daily. 30 tablet 11   No current facility-administered medications on file prior to visit.   Review of Systems   Constitutional: Negative for unusual diaphoresis or night sweats HENT: Negative for ringing in ear or discharge Eyes: Negative for double vision or worsening visual disturbance.  Respiratory: Negative for choking and stridor.   Gastrointestinal: Negative for vomiting or other signifcant bowel change Genitourinary: Negative for hematuria or change in urine volume.  Musculoskeletal: Negative for other MSK pain or swelling Skin: Negative for color change and worsening wound.  Neurological: Negative for tremors and numbness other than noted  Psychiatric/Behavioral: Negative for decreased concentration or agitation other than above        Objective:   Physical Exam BP 124/72 mmHg  Pulse 62  Temp(Src) 98.9 F (37.2 C) (Oral)  Ht 5\' 9"  (1.753 m)  Wt 188 lb (85.276 kg)  BMI 27.75 kg/m2  SpO2 97% VS noted,  Constitutional: Pt appears in no significant distress HENT: Head: NCAT.  Right Ear: External ear normal.  Left Ear: External ear normal.  Eyes: . Pupils are equal, round, and reactive to light. Conjunctivae and EOM are  normal Neck: Normal range of motion. Neck supple.  Cardiovascular: Normal rate and regular rhythm.   Pulmonary/Chest: Effort normal and breath sounds without rales or wheezing.  Abd:  Soft, NT, ND, + BS Neurological: Pt is alert. Not confused , motor grossly intact Skin: Skin is warm. No rash, no LE edema Psychiatric: Pt behavior is normal. No agitation.     Assessment & Plan:

## 2015-11-08 ENCOUNTER — Other Ambulatory Visit: Payer: Self-pay | Admitting: Internal Medicine

## 2015-12-06 ENCOUNTER — Encounter: Payer: Medicare Other | Admitting: Internal Medicine

## 2015-12-10 ENCOUNTER — Other Ambulatory Visit: Payer: Self-pay | Admitting: Internal Medicine

## 2015-12-11 ENCOUNTER — Ambulatory Visit (INDEPENDENT_AMBULATORY_CARE_PROVIDER_SITE_OTHER): Payer: Medicare Other | Admitting: Internal Medicine

## 2015-12-11 ENCOUNTER — Encounter: Payer: Self-pay | Admitting: Internal Medicine

## 2015-12-11 VITALS — BP 120/82 | HR 60 | Ht 69.0 in | Wt 194.0 lb

## 2015-12-11 DIAGNOSIS — I480 Paroxysmal atrial fibrillation: Secondary | ICD-10-CM | POA: Diagnosis not present

## 2015-12-11 DIAGNOSIS — I495 Sick sinus syndrome: Secondary | ICD-10-CM

## 2015-12-11 DIAGNOSIS — I10 Essential (primary) hypertension: Secondary | ICD-10-CM

## 2015-12-11 DIAGNOSIS — I4891 Unspecified atrial fibrillation: Secondary | ICD-10-CM | POA: Diagnosis not present

## 2015-12-11 LAB — CBC WITH DIFFERENTIAL/PLATELET
BASOS ABS: 0 10*3/uL (ref 0.0–0.1)
Basophils Relative: 0 % (ref 0–1)
EOS ABS: 0.1 10*3/uL (ref 0.0–0.7)
Eosinophils Relative: 1 % (ref 0–5)
HCT: 45.3 % (ref 39.0–52.0)
HEMOGLOBIN: 14.9 g/dL (ref 13.0–17.0)
LYMPHS ABS: 2.3 10*3/uL (ref 0.7–4.0)
Lymphocytes Relative: 32 % (ref 12–46)
MCH: 29.3 pg (ref 26.0–34.0)
MCHC: 32.9 g/dL (ref 30.0–36.0)
MCV: 89 fL (ref 78.0–100.0)
MONOS PCT: 11 % (ref 3–12)
MPV: 9.7 fL (ref 8.6–12.4)
Monocytes Absolute: 0.8 10*3/uL (ref 0.1–1.0)
NEUTROS ABS: 4.1 10*3/uL (ref 1.7–7.7)
NEUTROS PCT: 56 % (ref 43–77)
PLATELETS: 208 10*3/uL (ref 150–400)
RBC: 5.09 MIL/uL (ref 4.22–5.81)
RDW: 14 % (ref 11.5–15.5)
WBC: 7.3 10*3/uL (ref 4.0–10.5)

## 2015-12-11 LAB — CUP PACEART INCLINIC DEVICE CHECK
Brady Statistic RA Percent Paced: 71 %
Implantable Lead Implant Date: 20100719
Implantable Lead Location: 753860
Implantable Lead Model: 4136
Implantable Lead Serial Number: 28454141
Implantable Lead Serial Number: 28594836
Lead Channel Impedance Value: 670 Ohm
Lead Channel Pacing Threshold Amplitude: 0.8 V
Lead Channel Pacing Threshold Amplitude: 1.2 V
Lead Channel Pacing Threshold Pulse Width: 0.4 ms
Lead Channel Pacing Threshold Pulse Width: 1 ms
Lead Channel Setting Pacing Pulse Width: 1 ms
Lead Channel Setting Sensing Sensitivity: 2.5 mV
MDC IDC LEAD IMPLANT DT: 20100719
MDC IDC LEAD LOCATION: 753859
MDC IDC LEAD MODEL: 4137
MDC IDC MSMT LEADCHNL RA IMPEDANCE VALUE: 550 Ohm
MDC IDC MSMT LEADCHNL RA SENSING INTR AMPL: 1.5 mV — AB
MDC IDC PG SERIAL: 764370
MDC IDC SESS DTM: 20170125050000
MDC IDC SET LEADCHNL RA PACING AMPLITUDE: 2 V
MDC IDC SET LEADCHNL RV PACING AMPLITUDE: 2.4 V
MDC IDC STAT BRADY RV PERCENT PACED: 5 %

## 2015-12-11 LAB — HEPATIC FUNCTION PANEL
ALBUMIN: 4.2 g/dL (ref 3.6–5.1)
ALK PHOS: 75 U/L (ref 40–115)
ALT: 20 U/L (ref 9–46)
AST: 20 U/L (ref 10–35)
BILIRUBIN TOTAL: 0.4 mg/dL (ref 0.2–1.2)
Bilirubin, Direct: 0.1 mg/dL (ref <=0.2)
Indirect Bilirubin: 0.3 mg/dL (ref 0.2–1.2)
TOTAL PROTEIN: 7.2 g/dL (ref 6.1–8.1)

## 2015-12-11 LAB — BASIC METABOLIC PANEL
BUN: 14 mg/dL (ref 7–25)
CHLORIDE: 104 mmol/L (ref 98–110)
CO2: 24 mmol/L (ref 20–31)
CREATININE: 1.03 mg/dL (ref 0.70–1.18)
Calcium: 9.2 mg/dL (ref 8.6–10.3)
Glucose, Bld: 95 mg/dL (ref 65–99)
POTASSIUM: 4.1 mmol/L (ref 3.5–5.3)
SODIUM: 139 mmol/L (ref 135–146)

## 2015-12-11 LAB — LIPID PANEL
CHOL/HDL RATIO: 2.4 ratio (ref <=5.0)
Cholesterol: 170 mg/dL (ref 125–200)
HDL: 72 mg/dL (ref >=40)
LDL CALC: 81 mg/dL (ref <130)
TRIGLYCERIDES: 86 mg/dL (ref <150)
VLDL: 17 mg/dL (ref <30)

## 2015-12-11 NOTE — Patient Instructions (Signed)
Medication Instructions:  Your physician recommends that you continue on your current medications as directed. Please refer to the Current Medication list given to you today.   Labwork: Your physician recommends that you return for lab work today: lipids/bmp/cbc/liver   Testing/Procedures: None ordered   Follow-Up: Your physician wants you to follow-up in: 6 months with the device clinic and 12 months with Dr Rayann Heman Dennis Bast will receive a reminder letter in the mail two months in advance. If you don't receive a letter, please call our office to schedule the follow-up appointment.   Any Other Special Instructions Will Be Listed Below (If Applicable).     If you need a refill on your cardiac medications before your next appointment, please call your pharmacy.

## 2015-12-11 NOTE — Progress Notes (Signed)
PCP: Cathlean Cower, MD  [name] is a 75 y.o. male who presents today for routine electrophysiology followup.  Since last being seen in our clinic, the patient reports doing reasonalbly well.   His SOB seems better.  Today, he denies symptoms of palpitations, chest pain, presyncope, or syncope.  The patient is otherwise without complaint today.   Past Medical History  Diagnosis Date  . Hypertension   . Hyperlipidemia   . BPH (benign prostatic hyperplasia)   . Diverticulosis of colon (without mention of hemorrhage)   . Esophageal reflux   . Abdominal pain, epigastric   . Depressive disorder, not elsewhere classified   . Lumbago   . Allergic rhinitis, cause unspecified   . Sick sinus syndrome [facility]) July 2010    s/p PPM Corporate investment banker) by [employer]  . Persistent atrial fibrillation ([place])   . Chronic anticoagulation     xarelto  . High risk medication use   . Paralyzed hemidiaphragm     LEFT  . Coronary artery calcification seen on CAT scan 12/26/2012  . Arthritis   . Pacemaker   . Hyperplastic rectal polyp   . CHF (congestive heart failure) [facility])    Past Surgical History  Procedure Laterality Date  . Insert / replace / remove pacemaker  05/2009     [facility] scientific ALTRUA 60  (serial number [id number]) dual-chamber pacemaker  . Pacemaker placement Bilateral     '05  . Esophagogastroduodenoscopy N/A 12/05/2013    Procedure: ESOPHAGOGASTRODUODENOSCOPY (EGD);  Surgeon: [name], MD;  Location: [facility];  Service: Endoscopy;  Laterality: N/A;  . Esophagogastroduodenoscopy N/A 07/04/2015    Procedure: ESOPHAGOGASTRODUODENOSCOPY (EGD);  Surgeon: [name], MD;  Location: [place];  Service: Endoscopy;  Laterality: N/A;    Current Outpatient Prescriptions  Medication Sig Dispense Refill  . acetaminophen (TYLENOL ARTHRITIS PAIN) 650 MG CR tablet Take 650 mg by mouth every 8 (eight) hours as needed for pain.    Marland Kitchen atorvastatin (LIPITOR) 40 MG tablet TAKE 1 TABLET BY MOUTH  DAILY 90 tablet 3  . fexofenadine (ALLEGRA) 180 MG tablet TAKE 1 TABLET (180 MG TOTAL) BY MOUTH DAILY. 90 tablet 1  . flecainide (TAMBOCOR) 100 MG tablet TAKE 1 TABLET BY MOUTH TWICE A DAY 180 tablet 3  . fluticasone (FLONASE) 50 MCG/ACT nasal spray USE 2 SPRAYS IN EACH NOSTRIL ONCE A DAY 16 g 5  . latanoprost (XALATAN) 0.005 % ophthalmic solution Place 1 drop into both eyes daily.    Marland Kitchen omeprazole (PRILOSEC) 20 MG capsule Take 1 capsule (20 mg total) by mouth 2 (two) times daily. 60 capsule 3  . oxybutynin (DITROPAN-XL) 10 MG 24 hr tablet TAKE 1 TABLET (10 MG TOTAL) BY MOUTH DAILY. 90 tablet 1  . tamsulosin (FLOMAX) 0.4 MG CAPS capsule TAKE ONE CAPSULE BY MOUTH TWICE A DAY 60 capsule 11  . traMADol (ULTRAM) 50 MG tablet Take 1 tablet (50 mg total) by mouth every 6 (six) hours as needed. for pain 120 tablet 5  . XARELTO 20 MG TABS tablet TAKE 1 TABLET BY MOUTH EVERY DAY 90 tablet 3  . [DISCONTINUED] furosemide (LASIX) 40 MG tablet Take 1 tablet (40 mg total) by mouth daily. 30 tablet 11   No current facility-administered medications for this visit.    Physical Exam: Filed Vitals:   12/11/15 0847  BP: 120/82  Pulse: 60  Height: 5\' 9"  (1.753 m)  Weight: 194 lb (87.998 kg)    GEN- The patient is well appearing,  alert and oriented x 3 today.   Head- normocephalic, atraumatic Eyes-  Sclera clear, conjunctiva pink Ears- hearing intact Oropharynx- clear Lungs- Clear to ausculation bilaterally, normal work of breathing Chest- pacemaker pocket is well healed Heart- Regular rate and rhythm, no murmurs, rubs or gallops, PMI not laterally displaced GI- soft, NT, ND, + BS Extremities- no clubbing, cyanosis, trace edema in R leg  Pacemaker interrogation- reviewed in detail today,  See PACEART report  Assessment and Plan:  1. Sick sinus syndrome/ symptomatic bradycardia Normal pacemaker function See Pace Art report Histogram is appropriate No changes today  2. afib Well controlled with  flecainide Continue xarelto for stroke prevention Check bmet, cbc today  3. HTN Stable No change required today  4. HL Stable No change required today Lipids and liver today  Return to the device clinic in 6 months I will see in 1 year  Thompson Grayer MD, [facility] 12/11/2015 9:00 AM

## 2016-01-04 ENCOUNTER — Other Ambulatory Visit: Payer: Self-pay | Admitting: Internal Medicine

## 2016-01-06 NOTE — Telephone Encounter (Signed)
i am not showing a recent office visit for heart medication----please advise, thanks

## 2016-01-07 ENCOUNTER — Other Ambulatory Visit: Payer: Self-pay | Admitting: Internal Medicine

## 2016-01-07 NOTE — Telephone Encounter (Signed)
Ok for norvasc - done erx  Pt needs to request flecainde per Dr Allred/cardiology

## 2016-01-28 ENCOUNTER — Encounter: Payer: Self-pay | Admitting: Internal Medicine

## 2016-01-28 ENCOUNTER — Ambulatory Visit (INDEPENDENT_AMBULATORY_CARE_PROVIDER_SITE_OTHER): Payer: Medicare Other | Admitting: Internal Medicine

## 2016-01-28 VITALS — BP 140/80 | HR 73 | Temp 98.6°F | Resp 20 | Wt 190.0 lb

## 2016-01-28 DIAGNOSIS — R05 Cough: Secondary | ICD-10-CM

## 2016-01-28 DIAGNOSIS — J019 Acute sinusitis, unspecified: Secondary | ICD-10-CM | POA: Diagnosis not present

## 2016-01-28 DIAGNOSIS — I10 Essential (primary) hypertension: Secondary | ICD-10-CM | POA: Diagnosis not present

## 2016-01-28 DIAGNOSIS — R059 Cough, unspecified: Secondary | ICD-10-CM | POA: Insufficient documentation

## 2016-01-28 MED ORDER — HYDROCODONE-HOMATROPINE 5-1.5 MG/5ML PO SYRP: 5.0000 mL | ORAL_SOLUTION | Freq: Four times a day (QID) | ORAL | Status: DC | PRN

## 2016-01-28 MED ORDER — LEVOFLOXACIN 250 MG PO TABS: 250.0000 mg | ORAL_TABLET | Freq: Every day | ORAL | Status: DC

## 2016-01-28 NOTE — Progress Notes (Signed)
Pre visit review using our clinic review tool, if applicable. No additional management support is needed unless otherwise documented below in the visit note. 

## 2016-01-28 NOTE — Assessment & Plan Note (Signed)
stable overall by history and exam, recent data reviewed with pt, and pt to continue medical treatment as before,  to f/u any worsening symptoms or concerns BP Readings from Last 3 Encounters:  01/28/16 140/80  12/11/15 120/82  10/15/15 124/72

## 2016-01-28 NOTE — Patient Instructions (Signed)
Please take all new medication as prescribed - the antibiotic, and cough mediciine  Please continue all other medications as before, and refills have been done if requested.  Please have the pharmacy call with any other refills you may need.  Please keep your appointments with your specialists as you may have planned

## 2016-01-28 NOTE — Assessment & Plan Note (Signed)
Mild to mod, for antibx course,  to f/u any worsening symptoms or concerns 

## 2016-01-28 NOTE — Assessment & Plan Note (Signed)
Marked, unusual for him, declines cxr, for tx as above, possibly related to post nasal gtt

## 2016-01-28 NOTE — Progress Notes (Signed)
Subjective:    Patient ID: [name], male    DOB: July 20, 1941, 75 y.o.   MRN: [id number]  HPI   Here with 2-3 days acute onset fever, facial pain, pressure, headache, general weakness and malaise, and greenish d/c, and marked non prod cough with mild ST, but pt denies chest pain, wheezing, increased sob or doe, orthopnea, PND, increased LE swelling, palpitations, dizziness or syncope.  Pt denies new neurological symptoms such as new headache, or facial or extremity weakness or numbness   Pt denies polydipsia, polyuria, or low sugar symptoms such as weakness or confusion improved with po intake.  Pt states overall good compliance with meds, trying to follow lower cholesterol, diabetic diet, wt overall stable but little exercise however.    Past Medical History  Diagnosis Date  . Hypertension   . Hyperlipidemia   . BPH (benign prostatic hyperplasia)   . Diverticulosis of colon (without mention of hemorrhage)   . Esophageal reflux   . Abdominal pain, epigastric   . Depressive disorder, not elsewhere classified   . Lumbago   . Allergic rhinitis, cause unspecified   . 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 Persistent atrial fibrillation ([place])   . Chronic anticoagulation     xarelto  . High risk medication use   . Paralyzed hemidiaphragm     LEFT  . Coronary artery calcification seen on CAT scan 12/26/2012  . Arthritis   . Pacemaker   . Hyperplastic rectal polyp   . CHF (congestive heart failure) [facility])    Past Surgical History  Procedure Laterality Date  . Insert / replace / remove pacemaker  05/2009     [facility] scientific ALTRUA 60  (serial number [id number]) dual-chamber pacemaker  . Pacemaker placement Bilateral     '05  . Esophagogastroduodenoscopy N/A 12/05/2013    Procedure: ESOPHAGOGASTRODUODENOSCOPY (EGD);  Surgeon: [name], MD;  Location: [facility];  Service: Endoscopy;  Laterality: N/A;  . Esophagogastroduodenoscopy N/A 07/04/2015   Procedure: ESOPHAGOGASTRODUODENOSCOPY (EGD);  Surgeon: [name], MD;  Location: [place];  Service: Endoscopy;  Laterality: N/A;    reports that he quit smoking about 37 years ago. His smoking use included Cigarettes. He has a 12.5 pack-year smoking history. He has quit using smokeless tobacco. His smokeless tobacco use included Chew. He reports that he does not drink alcohol or use illicit drugs. family history includes Arthritis in his paternal grandmother; Asthma in his sister; Cancer in his father; Diabetes in his mother; Hypertension in his brother, daughter, father, mother, sister, and son; Lung cancer in his father. Allergies  Allergen Reactions  . Pantoprazole Sodium Itching  ' Current Outpatient Prescriptions on File Prior to Visit  Medication Sig Dispense Refill  . acetaminophen (TYLENOL ARTHRITIS PAIN) 650 MG CR tablet Take 650 mg by mouth every 8 (eight) hours as needed for pain.    Marland Kitchen amLODipine (NORVASC) 10 MG tablet TAKE 1 TABLET BY MOUTH EVERY DAY 90 tablet 3  . atorvastatin (LIPITOR) 40 MG tablet TAKE 1 TABLET BY MOUTH DAILY 90 tablet 3  . fexofenadine (ALLEGRA) 180 MG tablet TAKE 1 TABLET (180 MG TOTAL) BY MOUTH DAILY. 90 tablet 1  . flecainide (TAMBOCOR) 100 MG tablet TAKE 1 TABLET BY MOUTH TWICE A DAY 180 tablet 3  . fluticasone (FLONASE) 50 MCG/ACT nasal spray USE 2 SPRAYS IN EACH NOSTRIL ONCE A DAY 16 g 5  . latanoprost (XALATAN) 0.005 % ophthalmic solution  Place 1 drop into both eyes daily.    Marland Kitchen omeprazole (PRILOSEC) 20 MG capsule Take 1 capsule (20 mg total) by mouth 2 (two) times daily. 60 capsule 3  . oxybutynin (DITROPAN-XL) 10 MG 24 hr tablet TAKE 1 TABLET (10 MG TOTAL) BY MOUTH DAILY. 90 tablet 1  . tamsulosin (FLOMAX) 0.4 MG CAPS capsule TAKE ONE CAPSULE BY MOUTH TWICE A DAY 60 capsule 11  . traMADol (ULTRAM) 50 MG tablet Take 1 tablet (50 mg total) by mouth every 6 (six) hours as needed. for pain 120 tablet 5  . XARELTO 20 MG TABS tablet TAKE 1 TABLET BY  MOUTH EVERY DAY 90 tablet 3  . [DISCONTINUED] furosemide (LASIX) 40 MG tablet Take 1 tablet (40 mg total) by mouth daily. 30 tablet 11   No current facility-administered medications on file prior to visit.   Review of Systems  Constitutional: Negative for unusual diaphoresis or night sweats HENT: Negative for ringing in ear or discharge Eyes: Negative for double vision or worsening visual disturbance.  Respiratory: Negative for choking and stridor.   Gastrointestinal: Negative for vomiting or other signifcant bowel change Genitourinary: Negative for hematuria or change in urine volume.  Musculoskeletal: Negative for other MSK pain or swelling Skin: Negative for color change and worsening wound.  Neurological: Negative for tremors and numbness other than noted  Psychiatric/Behavioral: Negative for decreased concentration or agitation other than above       Objective:   Physical Exam BP 140/80 mmHg  Pulse 73  Temp(Src) 98.6 F (37 C) (Oral)  Resp 20  Wt 190 lb (86.183 kg)  SpO2 95% VS noted, mild ill Constitutional: Pt appears in no significant distress HENT: Head: NCAT.  Right Ear: External ear normal.  Left Ear: External ear normal.  Bilat tm's with mild erythema.  Max sinus areas mild tender.  Pharynx with mild erythema, no exudate Eyes: . Pupils are equal, round, and reactive to light. Conjunctivae and EOM are normal Neck: Normal range of motion. Neck supple.  Cardiovascular: Normal rate and regular rhythm.   Pulmonary/Chest: Effort normal and breath sounds without rales or wheezing.  Neurological: Pt is alert. Not confused , motor grossly intact Skin: Skin is warm. No rash, no LE edema Psychiatric: Pt behavior is normal. No agitation.     Assessment & Plan:

## 2016-01-30 ENCOUNTER — Other Ambulatory Visit: Payer: Self-pay | Admitting: Internal Medicine

## 2016-01-30 ENCOUNTER — Telehealth: Payer: Self-pay | Admitting: Internal Medicine

## 2016-01-30 NOTE — Telephone Encounter (Signed)
[name] - Client [name] Patient Name: [name] DOB: 09-09-1941 Initial Comment Caller states was given levofloxacin ABX for head cold on Tuesday, having nausea, dizziness, weakness, stomach is hurting. Nurse Assessment Nurse: Markus Daft, RN, Sherre Poot Date/Time (Eastern Time): 01/30/2016 8:54:37 AM Confirm and document reason for call. If symptomatic, describe symptoms. You must click the next button to save text entered. ---Caller states was given levofloxacin 250 mg QD for head cold on Tuesday. Denies sinus pain. He had been sick for 3 - 4 days stuffy nose and frequent hard cough. No chest pain, or sob. He's been having nausea, dizziness, weakness, stomach is hurting within a few hrs of taking the first dose. He doesn't want to take the antibiotic anymore. No fever. Has the patient traveled out of the country within the last 30 days? ---Not Applicable Does the patient have any new or worsening symptoms? ---Yes Will a triage be completed? ---Yes Related visit to physician within the last 2 weeks? ---Yes Does the PT have any chronic conditions? (i.e. diabetes, asthma, etc.) ---Yes List chronic conditions. ---HTN, GERD, High cholesterol, seasonal allergies, arthritis Is this a behavioral health or substance abuse call? ---No Guidelines Guideline Title Affirmed Question Affirmed Notes Common Cold Cold with no complications (all triage questions negative) Final Disposition [place], RN, Sherre Poot Comments He states that productive cough has gotten better and just a little mucous, and just a little stuffy nose and some yellow discharge. Pt denies any nausea/dizziness/weakness/abdominal pain since stopping the medicine. Disagree/Comply: Comply

## 2016-02-18 ENCOUNTER — Other Ambulatory Visit: Payer: Self-pay | Admitting: Gastroenterology

## 2016-02-26 ENCOUNTER — Other Ambulatory Visit: Payer: Self-pay | Admitting: Gastroenterology

## 2016-02-26 ENCOUNTER — Telehealth: Payer: Self-pay

## 2016-02-26 ENCOUNTER — Telehealth: Payer: Self-pay | Admitting: Internal Medicine

## 2016-02-26 MED ORDER — TRAMADOL HCL 50 MG PO TABS: 50.0000 mg | ORAL_TABLET | Freq: Four times a day (QID) | ORAL | Status: DC | PRN

## 2016-02-26 NOTE — Telephone Encounter (Signed)
Pt has called back to follow up on this

## 2016-02-26 NOTE — Telephone Encounter (Signed)
Please advise patient is requesting a refill on tramadol

## 2016-02-26 NOTE — Telephone Encounter (Signed)
Error. Pt says that he will call back in to discuss medications.

## 2016-02-26 NOTE — Addendum Note (Signed)
Addended by: Biagio Borg on: 02/26/2016 07:25 PM   Modules accepted: Orders

## 2016-02-26 NOTE — Telephone Encounter (Signed)
Done hardcopy to Corinne  

## 2016-02-27 ENCOUNTER — Other Ambulatory Visit: Payer: Self-pay | Admitting: Internal Medicine

## 2016-02-27 NOTE — Telephone Encounter (Signed)
Rx faxed back to [facility].../lmb 

## 2016-02-29 ENCOUNTER — Other Ambulatory Visit: Payer: Self-pay | Admitting: Internal Medicine

## 2016-03-02 ENCOUNTER — Telehealth: Payer: Self-pay

## 2016-03-02 NOTE — Telephone Encounter (Signed)
Please advise patient is requesting a refill on tramadol

## 2016-03-03 NOTE — Telephone Encounter (Signed)
Too soon for tramadol since last rx for total 6 mo was just done last week

## 2016-03-11 ENCOUNTER — Other Ambulatory Visit: Payer: Self-pay | Admitting: Internal Medicine

## 2016-04-14 ENCOUNTER — Ambulatory Visit: Payer: Medicare Other | Admitting: Internal Medicine

## 2016-04-23 DIAGNOSIS — H401131 Primary open-angle glaucoma, bilateral, mild stage: Secondary | ICD-10-CM | POA: Diagnosis not present

## 2016-04-23 DIAGNOSIS — H25812 Combined forms of age-related cataract, left eye: Secondary | ICD-10-CM | POA: Diagnosis not present

## 2016-04-23 DIAGNOSIS — H2511 Age-related nuclear cataract, right eye: Secondary | ICD-10-CM | POA: Diagnosis not present

## 2016-04-25 ENCOUNTER — Other Ambulatory Visit: Payer: Self-pay | Admitting: Internal Medicine

## 2016-04-28 ENCOUNTER — Encounter: Payer: Self-pay | Admitting: Internal Medicine

## 2016-04-28 ENCOUNTER — Other Ambulatory Visit (INDEPENDENT_AMBULATORY_CARE_PROVIDER_SITE_OTHER): Payer: Medicare Other

## 2016-04-28 ENCOUNTER — Ambulatory Visit (INDEPENDENT_AMBULATORY_CARE_PROVIDER_SITE_OTHER): Payer: Medicare Other | Admitting: Internal Medicine

## 2016-04-28 VITALS — BP 122/70 | HR 69 | Temp 97.8°F | Resp 20 | Wt 189.0 lb

## 2016-04-28 DIAGNOSIS — R5383 Other fatigue: Secondary | ICD-10-CM

## 2016-04-28 DIAGNOSIS — N320 Bladder-neck obstruction: Secondary | ICD-10-CM | POA: Diagnosis not present

## 2016-04-28 DIAGNOSIS — I10 Essential (primary) hypertension: Secondary | ICD-10-CM

## 2016-04-28 DIAGNOSIS — E785 Hyperlipidemia, unspecified: Secondary | ICD-10-CM

## 2016-04-28 DIAGNOSIS — N400 Enlarged prostate without lower urinary tract symptoms: Secondary | ICD-10-CM

## 2016-04-28 LAB — TSH: TSH: 0.6 u[IU]/mL (ref 0.35–4.50)

## 2016-04-28 LAB — PSA: PSA: 1.51 ng/mL (ref 0.10–4.00)

## 2016-04-28 NOTE — Assessment & Plan Note (Signed)
Asympt, also for psa as he is due 

## 2016-04-28 NOTE — Progress Notes (Signed)
Subjective:    Patient ID: [name], male    DOB: 12-Jul-1941, 75 y.o.   MRN: [id number]  HPI  Here for yearly f/u;  Overall doing ok;  Pt denies Chest pain, worsening SOB, DOE, wheezing, orthopnea, PND, worsening LE edema, palpitations, dizziness or syncope.  Pt denies neurological change such as new headache, facial or extremity weakness.  Pt denies polydipsia, polyuria, or low sugar symptoms. Pt states overall good compliance with treatment and medications, good tolerability, and has been trying to follow appropriate diet.  Pt denies worsening depressive symptoms, suicidal ideation or panic. No fever, night sweats, wt loss, loss of appetite, or other constitutional symptoms.  Pt states good ability with ADL's, has low fall risk, home safety reviewed and adequate, no other significant changes in hearing or vision, and only occasionally active with exercise. Pt continues to have recurring LBP without change in severity, bowel or bladder change, fever, wt loss,  worsening LE pain/numbness/weakness, gait change or falls.  Does c/o ongoing fatigue, but denies signficant daytime hypersomnolence.  Denies urinary symptoms such as dysuria, frequency, urgency, flank pain, hematuria or n/v, fever, chills. Has hx of elevated PSA in the past, improved with antibiotic Past Medical History  Diagnosis Date  . Hypertension   . Hyperlipidemia   . BPH (benign prostatic hyperplasia)   . Diverticulosis of colon (without mention of hemorrhage)   . Esophageal reflux   . Abdominal pain, epigastric   . Depressive disorder, not elsewhere classified   . Lumbago   . Allergic rhinitis, cause unspecified   . 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 Persistent atrial fibrillation ([place])   . Chronic anticoagulation     xarelto  . High risk medication use   . Paralyzed hemidiaphragm     LEFT  . Coronary artery calcification seen on CAT scan 12/26/2012  . Arthritis   . Pacemaker   .  Hyperplastic rectal polyp   . CHF (congestive heart failure) [facility])    Past Surgical History  Procedure Laterality Date  . Insert / replace / remove pacemaker  05/2009     [facility] scientific ALTRUA 60  (serial number [id number]) dual-chamber pacemaker  . Pacemaker placement Bilateral     '05  . Esophagogastroduodenoscopy N/A 12/05/2013    Procedure: ESOPHAGOGASTRODUODENOSCOPY (EGD);  Surgeon: [name], MD;  [facility];  Service: Endoscopy;  Laterality: N/A;  . Esophagogastroduodenoscopy N/A 07/04/2015    Procedure: ESOPHAGOGASTRODUODENOSCOPY (EGD);  Surgeon: [name], MD;  Location: [place];  Service: Endoscopy;  Laterality: N/A;    reports that he quit smoking about 37 years ago. His smoking use included Cigarettes. He has a 12.5 pack-year smoking history. He has quit using smokeless tobacco. His smokeless tobacco use included Chew. He reports that he does not drink alcohol or use illicit drugs. family history includes Arthritis in his paternal grandmother; Asthma in his sister; Cancer in his father; Diabetes in his mother; Hypertension in his brother, daughter, father, mother, sister, and son; Lung cancer in his father. Allergies  Allergen Reactions  . Pantoprazole Sodium Itching   Current Outpatient Prescriptions on File Prior to Visit  Medication Sig Dispense Refill  . acetaminophen (TYLENOL ARTHRITIS PAIN) 650 MG CR tablet Take 650 mg by mouth every 8 (eight) hours as needed for pain.    Marland Kitchen amLODipine (NORVASC) 10 MG tablet TAKE 1 TABLET BY MOUTH EVERY DAY 90 tablet 3  . atorvastatin (LIPITOR)  40 MG tablet TAKE 1 TABLET BY MOUTH DAILY 90 tablet 3  . fexofenadine (ALLEGRA) 180 MG tablet TAKE 1 TABLET (180 MG TOTAL) BY MOUTH DAILY. 90 tablet 1  . flecainide (TAMBOCOR) 100 MG tablet TAKE 1 TABLET BY MOUTH TWICE A DAY 180 tablet 3  . fluticasone (FLONASE) 50 MCG/ACT nasal spray USE 2 SPRAYS IN EACH NOSTRIL ONCE A DAY 16 g 5  . fluticasone (FLONASE) 50 MCG/ACT nasal  spray USE 2 SPRAYS IN EACH NOSTRIL ONCE A DAY 16 g 5  . latanoprost (XALATAN) 0.005 % ophthalmic solution Place 1 drop into both eyes daily.    Marland Kitchen omeprazole (PRILOSEC) 20 MG capsule TAKE ONE CAPSULE BY MOUTH TWICE A DAY 60 capsule 3  . oxybutynin (DITROPAN-XL) 10 MG 24 hr tablet TAKE 1 TABLET (10 MG TOTAL) BY MOUTH DAILY. 90 tablet 1  . tamsulosin (FLOMAX) 0.4 MG CAPS capsule TAKE ONE CAPSULE BY MOUTH TWICE A DAY 60 capsule 11  . traMADol (ULTRAM) 50 MG tablet Take 1 tablet (50 mg total) by mouth every 6 (six) hours as needed. for pain 120 tablet 5  . XARELTO 20 MG TABS tablet TAKE 1 TABLET BY MOUTH EVERY DAY 90 tablet 1  . [DISCONTINUED] furosemide (LASIX) 40 MG tablet Take 1 tablet (40 mg total) by mouth daily. 30 tablet 11   No current facility-administered medications on file prior to visit.   Review of Systems Constitutional: Negative for increased diaphoresis, or other activity, appetite or siginficant weight change other than noted HENT: Negative for worsening hearing loss, ear pain, facial swelling, mouth sores and neck stiffness.   Eyes: Negative for other worsening pain, redness or visual disturbance.  Respiratory: Negative for choking or stridor Cardiovascular: Negative for other chest pain and palpitations.  Gastrointestinal: Negative for worsening diarrhea, blood in stool, or abdominal distention Genitourinary: Negative for hematuria, flank pain or change in urine volume.  Musculoskeletal: Negative for myalgias or other joint complaints.  Skin: Negative for other color change and wound or drainage.  Neurological: Negative for syncope and numbness. other than noted Hematological: Negative for adenopathy. or other swelling Psychiatric/Behavioral: Negative for hallucinations, SI, self-injury, decreased concentration or other worsening agitation.      Objective:   Physical Exam BP 122/70 mmHg  Pulse 69  Temp(Src) 97.8 F (36.6 C) (Oral)  Resp 20  Wt 189 lb (85.73 kg)  SpO2  96% VS noted,  Constitutional: Pt is oriented to person, place, and time. Appears well-developed and well-nourished, in no significant distress Head: Normocephalic and atraumatic  Eyes: Conjunctivae and EOM are normal. Pupils are equal, round, and reactive to light Right Ear: External ear normal.  Left Ear: External ear normal Nose: Nose normal.  Mouth/Throat: Oropharynx is clear and moist  Neck: Normal range of motion. Neck supple. No JVD present. No tracheal deviation present or significant neck LA or mass Cardiovascular: Normal rate, regular rhythm, normal heart sounds and intact distal pulses.   Pulmonary/Chest: Effort normal and breath sounds without rales or wheezing  Abdominal: Soft. Bowel sounds are normal. NT. No HSM  Musculoskeletal: Normal range of motion. Exhibits no edema Lymphadenopathy: Has no cervical adenopathy.  Neurological: Pt is alert and oriented to person, place, and time. Pt has normal reflexes. No cranial nerve deficit. Motor grossly intact Skin: Skin is warm and dry. No rash noted or new ulcers Psychiatric:  Has normal mood and affect. Behavior is normal.     Assessment & Plan:

## 2016-04-28 NOTE — Patient Instructions (Signed)

## 2016-04-28 NOTE — Assessment & Plan Note (Signed)
Etiology unclear, Exam otherwise benign, to check labs as documented, follow with expectant management  

## 2016-04-28 NOTE — Assessment & Plan Note (Signed)
stable overall by history and exam, recent data reviewed with pt, and pt to continue medical treatment as before,  to f/u any worsening symptoms or concerns BP Readings from Last 3 Encounters:  04/28/16 122/70  01/28/16 140/80  12/11/15 120/82

## 2016-04-28 NOTE — Assessment & Plan Note (Signed)
stable overall by history and exam, recent data reviewed with pt, and pt to continue medical treatment as before,  to f/u any worsening symptoms or concerns Lab Results  Component Value Date   LDLCALC 81 12/11/2015

## 2016-04-28 NOTE — Progress Notes (Signed)
Pre visit review using our clinic review tool, if applicable. No additional management support is needed unless otherwise documented below in the visit note. 

## 2016-05-10 ENCOUNTER — Emergency Department (HOSPITAL_BASED_OUTPATIENT_CLINIC_OR_DEPARTMENT_OTHER)
Admission: EM | Admit: 2016-05-10 | Discharge: 2016-05-10 | Disposition: A | Discharge status: Home / Self Care | Payer: Medicare Other | Attending: Emergency Medicine | Admitting: Emergency Medicine

## 2016-05-10 ENCOUNTER — Encounter (HOSPITAL_BASED_OUTPATIENT_CLINIC_OR_DEPARTMENT_OTHER): Payer: Self-pay | Admitting: Emergency Medicine

## 2016-05-10 ENCOUNTER — Emergency Department (HOSPITAL_BASED_OUTPATIENT_CLINIC_OR_DEPARTMENT_OTHER): Payer: Medicare Other

## 2016-05-10 DIAGNOSIS — R197 Diarrhea, unspecified: Secondary | ICD-10-CM | POA: Diagnosis not present

## 2016-05-10 DIAGNOSIS — I481 Persistent atrial fibrillation: Secondary | ICD-10-CM | POA: Diagnosis not present

## 2016-05-10 DIAGNOSIS — I509 Heart failure, unspecified: Secondary | ICD-10-CM | POA: Insufficient documentation

## 2016-05-10 DIAGNOSIS — E785 Hyperlipidemia, unspecified: Secondary | ICD-10-CM | POA: Diagnosis not present

## 2016-05-10 DIAGNOSIS — R112 Nausea with vomiting, unspecified: Secondary | ICD-10-CM

## 2016-05-10 DIAGNOSIS — M1990 Unspecified osteoarthritis, unspecified site: Secondary | ICD-10-CM | POA: Insufficient documentation

## 2016-05-10 DIAGNOSIS — K529 Noninfective gastroenteritis and colitis, unspecified: Secondary | ICD-10-CM | POA: Insufficient documentation

## 2016-05-10 DIAGNOSIS — Z79899 Other long term (current) drug therapy: Secondary | ICD-10-CM | POA: Insufficient documentation

## 2016-05-10 DIAGNOSIS — F329 Major depressive disorder, single episode, unspecified: Secondary | ICD-10-CM | POA: Diagnosis not present

## 2016-05-10 DIAGNOSIS — J9811 Atelectasis: Secondary | ICD-10-CM | POA: Diagnosis not present

## 2016-05-10 DIAGNOSIS — I110 Hypertensive heart disease with heart failure: Secondary | ICD-10-CM | POA: Diagnosis not present

## 2016-05-10 DIAGNOSIS — Z87891 Personal history of nicotine dependence: Secondary | ICD-10-CM | POA: Diagnosis not present

## 2016-05-10 LAB — URINE MICROSCOPIC-ADD ON

## 2016-05-10 LAB — COMPREHENSIVE METABOLIC PANEL
ALK PHOS: 66 U/L (ref 38–126)
ALT: 24 U/L (ref 17–63)
AST: 29 U/L (ref 15–41)
Albumin: 4.6 g/dL (ref 3.5–5.0)
Anion gap: 9 (ref 5–15)
BUN: 15 mg/dL (ref 6–20)
CALCIUM: 9.2 mg/dL (ref 8.9–10.3)
CHLORIDE: 108 mmol/L (ref 101–111)
CO2: 21 mmol/L — AB (ref 22–32)
CREATININE: 0.8 mg/dL (ref 0.61–1.24)
Glucose, Bld: 137 mg/dL — ABNORMAL HIGH (ref 65–99)
Potassium: 3.5 mmol/L (ref 3.5–5.1)
Sodium: 138 mmol/L (ref 135–145)
Total Bilirubin: 0.8 mg/dL (ref 0.3–1.2)
Total Protein: 7.7 g/dL (ref 6.5–8.1)

## 2016-05-10 LAB — URINALYSIS, ROUTINE W REFLEX MICROSCOPIC
BILIRUBIN URINE: NEGATIVE
GLUCOSE, UA: NEGATIVE mg/dL
KETONES UR: NEGATIVE mg/dL
Leukocytes, UA: NEGATIVE
Nitrite: NEGATIVE
PROTEIN: NEGATIVE mg/dL
Specific Gravity, Urine: 1.023 (ref 1.005–1.030)
pH: 6.5 (ref 5.0–8.0)

## 2016-05-10 LAB — CBC
HCT: 44.6 % (ref 39.0–52.0)
Hemoglobin: 15.5 g/dL (ref 13.0–17.0)
MCH: 30.2 pg (ref 26.0–34.0)
MCHC: 34.8 g/dL (ref 30.0–36.0)
MCV: 86.8 fL (ref 78.0–100.0)
PLATELETS: 211 10*3/uL (ref 150–400)
RBC: 5.14 MIL/uL (ref 4.22–5.81)
RDW: 13.4 % (ref 11.5–15.5)
WBC: 8.7 10*3/uL (ref 4.0–10.5)

## 2016-05-10 LAB — LIPASE, BLOOD: LIPASE: 22 U/L (ref 11–51)

## 2016-05-10 MED ORDER — PROMETHAZINE HCL 25 MG/ML IJ SOLN
12.5000 mg | Freq: Once | INTRAMUSCULAR | Status: AC
  Administered 2016-05-10: 12.5 mg via INTRAVENOUS
  Filled 2016-05-10: qty 1

## 2016-05-10 MED ORDER — SODIUM CHLORIDE 0.9 % IV BOLUS (SEPSIS)
1000.0000 mL | Freq: Once | INTRAVENOUS | Status: AC
  Administered 2016-05-10: 1000 mL via INTRAVENOUS

## 2016-05-10 MED ORDER — PROMETHAZINE HCL 12.5 MG PO TABS
12.5000 mg | ORAL_TABLET | Freq: Four times a day (QID) | ORAL | Status: DC | PRN
Start: 2016-05-10 — End: 2017-11-01

## 2016-05-10 MED ORDER — MORPHINE SULFATE (PF) 4 MG/ML IV SOLN
4.0000 mg | Freq: Once | INTRAVENOUS | Status: AC
  Administered 2016-05-10: 4 mg via INTRAVENOUS
  Filled 2016-05-10: qty 1

## 2016-05-10 MED ORDER — ONDANSETRON HCL 4 MG/2ML IJ SOLN
4.0000 mg | Freq: Once | INTRAMUSCULAR | Status: AC | PRN
  Administered 2016-05-10: 4 mg via INTRAVENOUS
  Filled 2016-05-10: qty 2

## 2016-05-10 MED ORDER — ACETAMINOPHEN 325 MG PO TABS
650.0000 mg | ORAL_TABLET | Freq: Once | ORAL | Status: AC | PRN
  Administered 2016-05-10: 650 mg via ORAL
  Filled 2016-05-10: qty 2

## 2016-05-10 NOTE — ED Provider Notes (Signed)
CSN: [id number]     Arrival date & time 05/10/16  1636 History   By signing my name below, I, [name], attest that this documentation has been prepared under the direction and in the presence of [name] Gambler, MD.  Electronically Signed: Reola [name], ED Scribe. 05/10/2016. 6:00 PM.   Chief Complaint  Patient presents with  . Emesis   The history is provided by the patient. No language interpreter was used.   HPI Comments: [name] is a 75 y.o. male with a PMHx significant for HTN, HLD, left-sided paralyzed hemidiaphragm,and GERD who presents to the Emergency Department complaining of gradual onset, gradually worsening, intermittent emesis onset this morning, ~9 hours PTA. Pt reports that he has thrown up ~25 times today before coming into the ED. Pt has associated lower abdominal pain, intermittent diarrhea, chills, and SOB. He reports that his SOB is due to his paralyzed hemidiaphragm, and has worsening since the onset of his emesis episodes. His emesis is exacerbated when he eats food.  He notes that his abdominal pain is slightly alleviated after using the bathroom. No alleviating factors noted or OTC medications tried PTA.No sick contact with similar symptoms. He has not eaten any foods that were undercooked or that tasted off. Pt denies hematemesis, fever, back pain, dysuria, CP, bloody stools,or cough.   Past Medical History  Diagnosis Date  . Hypertension   . Hyperlipidemia   . BPH (benign prostatic hyperplasia)   . Diverticulosis of colon (without mention of hemorrhage)   . Esophageal reflux   . Abdominal pain, epigastric   . Depressive disorder, not elsewhere classified   . Lumbago   . Allergic rhinitis, cause unspecified   . Sick sinus syndrome [facility]) July 2010    s/p PPM Corporate investment banker) by [employer]  . Persistent atrial fibrillation ([place])   . Chronic anticoagulation     xarelto  . High risk medication use   . Paralyzed hemidiaphragm     LEFT  .  Coronary artery calcification seen on CAT scan 12/26/2012  . Arthritis   . Pacemaker   . Hyperplastic rectal polyp   . CHF (congestive heart failure) [facility])    Past Surgical History  Procedure Laterality Date  . Insert / replace / remove pacemaker  05/2009     [facility] scientific ALTRUA 60  (serial number [id number]) dual-chamber pacemaker  . Pacemaker placement Bilateral     '05  . Esophagogastroduodenoscopy N/A 12/05/2013    Procedure: ESOPHAGOGASTRODUODENOSCOPY (EGD);  Surgeon: [name], MD;  Location: [facility];  Service: Endoscopy;  Laterality: N/A;  . Esophagogastroduodenoscopy N/A 07/04/2015    Procedure: ESOPHAGOGASTRODUODENOSCOPY (EGD);  Surgeon: [name], MD;  Location: [place];  Service: Endoscopy;  Laterality: N/A;   Family History  Problem Relation Age of Onset  . Hypertension Father   . Lung cancer Father   . Cancer Father   . Asthma Sister   . Hypertension Sister   . Arthritis Paternal Grandmother   . Diabetes Mother   . Hypertension Mother   . Hypertension Brother   . Hypertension Daughter   . Hypertension Son    Social History  Substance Use Topics  . Smoking status: Former Smoker -- 0.50 packs/day for 25 years    Types: Cigarettes    Quit date: 11/16/1978  . Smokeless tobacco: Former Systems developer    Types: Chew  . Alcohol Use: No    Review of Systems  Constitutional: Positive for chills. Negative for fever.  Respiratory: Positive for shortness of breath. Negative for cough.   Cardiovascular: Negative for chest pain.  Gastrointestinal: Positive for vomiting, abdominal pain (lower) and diarrhea. Negative for blood in stool.  Genitourinary: Negative for dysuria.  Musculoskeletal: Negative for back pain.  All other systems reviewed and are negative.  Allergies  Pantoprazole sodium  Home Medications   Prior to Admission medications   Medication Sig Start Date End Date Taking? Authorizing Provider  acetaminophen (TYLENOL ARTHRITIS PAIN) 650 MG CR  tablet Take 650 mg by mouth every 8 (eight) hours as needed for pain.    Historical Provider, MD  amLODipine (NORVASC) 10 MG tablet TAKE 1 TABLET BY MOUTH EVERY DAY 01/07/16   [name] Borg, MD  atorvastatin (LIPITOR) 40 MG tablet TAKE 1 TABLET BY MOUTH DAILY 11/08/15   [name] Borg, MD  fexofenadine (ALLEGRA) 180 MG tablet TAKE 1 TABLET (180 MG TOTAL) BY MOUTH DAILY. 12/10/15   [name] Borg, MD  flecainide (TAMBOCOR) 100 MG tablet TAKE 1 TABLET BY MOUTH TWICE A DAY 01/07/16   [name] Borg, MD  fluticasone [facility]) 50 MCG/ACT nasal spray USE 2 SPRAYS IN [place] NOSTRIL ONCE A DAY 06/29/14   [name] Borg, MD  fluticasone [facility]) 50 MCG/ACT nasal spray USE 2 SPRAYS IN [facility] NOSTRIL ONCE A DAY 03/11/16   [name] Borg, MD  latanoprost (XALATAN) 0.005 % ophthalmic solution Place 1 drop into both eyes daily.    Historical Provider, MD  omeprazole (PRILOSEC) 20 MG capsule TAKE ONE CAPSULE BY MOUTH TWICE A DAY 03/02/16   [name] Borg, MD  oxybutynin (DITROPAN-XL) 10 MG 24 hr tablet TAKE 1 TABLET (10 MG TOTAL) BY MOUTH DAILY. 08/02/15   [name] Borg, MD  promethazine (PHENERGAN) 12.5 MG tablet Take 1 tablet (12.5 mg total) by mouth every 6 (six) hours as needed for nausea or vomiting. 05/10/16   [name] Gambler, MD  tamsulosin (FLOMAX) 0.4 MG CAPS capsule TAKE ONE CAPSULE BY MOUTH TWICE A DAY 01/30/16   [name] Borg, MD  traMADol (ULTRAM) 50 MG tablet Take 1 tablet (50 mg total) by mouth every 6 (six) hours as needed. for pain 02/26/16   [name] Borg, MD  XARELTO 20 MG TABS tablet TAKE 1 TABLET BY MOUTH EVERY DAY 04/27/16   [name] Borg, MD   BP 165/94 mmHg  Pulse 65  Temp(Src) 99.9 F (37.7 C) (Rectal)  Resp 23  Ht 5\' 9"  (1.753 m)  Wt 186 lb (84.369 kg)  BMI 27.45 kg/m2  SpO2 94%   Physical Exam  Constitutional: He is oriented to person, place, and time. He appears well-developed and well-nourished.  HENT:  Head: Normocephalic and atraumatic.  Right Ear: External ear normal.  Left Ear: External ear normal.   Nose: Nose normal.  Eyes: Right eye exhibits no discharge. Left eye exhibits no discharge.  Neck: Neck supple.  Cardiovascular: Normal heart sounds and intact distal pulses.   Pulmonary/Chest: Effort normal. No respiratory distress. He has decreased breath sounds in the left lower field.  Abdominal: Soft. Bowel sounds are normal. There is no tenderness.  Musculoskeletal: He exhibits no edema.  Neurological: He is alert and oriented to person, place, and time.  Skin: Skin is warm and dry.  Nursing note and vitals reviewed.  ED Course  Procedures (including critical care time)  DIAGNOSTIC STUDIES: Oxygen Saturation is 100% on RA, normal by my interpretation.   COORDINATION OF CARE: 5:32 PM-Discussed next steps with pt including UA, CBC Lipase,  and CMP. Pt verbalized understanding and is agreeable with the plan.   Labs Review Labs Reviewed  COMPREHENSIVE METABOLIC PANEL - Abnormal; Notable for the following:    CO2 21 (*)    Glucose, Bld 137 (*)    All other components within normal limits  URINALYSIS, ROUTINE W REFLEX MICROSCOPIC (NOT AT [facility]) - Abnormal; Notable for the following:    Hgb urine dipstick TRACE (*)    All other components within normal limits  URINE MICROSCOPIC-ADD ON - Abnormal; Notable for the following:    Squamous Epithelial / LPF 0-5 (*)    Bacteria, UA FEW (*)    All other components within normal limits  LIPASE, BLOOD  CBC    Imaging Review Dg Chest 2 View  05/10/2016  CLINICAL DATA:  Intermittent emesis onset this morning, APPROX.9 hours PTA. Pt reports that he has thrown up APPROX.25 times today. He states that he felt normal yesterday. Pt has associated lower abdominal pain, intermittent diarrhea, and SOB* EXAM: CHEST  2 VIEW COMPARISON:  06/30/2015 FINDINGS: LEFT-sided pacemaker overlies normal cardiac silhouette. Mild LEFT basilar atelectasis associated with the elevated LEFT hemidiaphragm. Normal bowel-gas pattern of the upper abdomen. Gas in the  stomach. IMPRESSION: LEFT basilar atelectasis.  No acute findings. Electronically Signed   By: Suzy Bouchard M.D.   On: 05/10/2016 18:20   I have personally reviewed and evaluated these images and lab results as part of my medical decision-making.  MDM   Final diagnoses:  Nausea vomiting and diarrhea  Gastroenteritis    Patient most likely has viral gastroenteritis. Patient overall feels much better after Phenergan. Zofran had no significant effect. He was given IV fluids. Lab work is unremarkable besides mildly decreased bicarbonate. However no anion gap. No fever. Does not appear septic or severely ill. No reproducible abdominal pain, do not think imaging is needed. Will discharge with anti-emetics and encourage increased fluids at home. Patient was able to tolerate fluids without difficulty here. Discussed return precautions.  I personally performed the services described in this documentation, which was scribed in my presence. The recorded information has been reviewed and is accurate.     [name] Gambler, MD 05/10/16 [phone]

## 2016-05-10 NOTE — ED Notes (Signed)
Pt on automatic VS.  

## 2016-05-10 NOTE — ED Notes (Signed)
Patient reports that he has has been vomiting all day. Family members states that he is also SOB of breath, having hot and cold chills and feeling weak .

## 2016-06-10 ENCOUNTER — Ambulatory Visit (INDEPENDENT_AMBULATORY_CARE_PROVIDER_SITE_OTHER): Payer: Medicare Other | Admitting: *Deleted

## 2016-06-10 DIAGNOSIS — I480 Paroxysmal atrial fibrillation: Secondary | ICD-10-CM

## 2016-06-10 DIAGNOSIS — I495 Sick sinus syndrome: Secondary | ICD-10-CM

## 2016-06-10 LAB — CUP PACEART INCLINIC DEVICE CHECK
Brady Statistic RA Percent Paced: 73 %
Implantable Lead Implant Date: 20100719
Implantable Lead Location: 753859
Implantable Lead Model: 4136
Implantable Lead Serial Number: 28594836
Lead Channel Pacing Threshold Amplitude: 1.2 V
Lead Channel Pacing Threshold Pulse Width: 1 ms
Lead Channel Setting Pacing Amplitude: 2 V
Lead Channel Setting Pacing Amplitude: 2.4 V
Lead Channel Setting Pacing Pulse Width: 1 ms
MDC IDC LEAD IMPLANT DT: 20100719
MDC IDC LEAD LOCATION: 753860
MDC IDC LEAD MODEL: 4137
MDC IDC LEAD SERIAL: 28454141
MDC IDC MSMT LEADCHNL RA IMPEDANCE VALUE: 540 Ohm
MDC IDC MSMT LEADCHNL RA PACING THRESHOLD AMPLITUDE: 0.8 V
MDC IDC MSMT LEADCHNL RA PACING THRESHOLD PULSEWIDTH: 0.4 ms
MDC IDC MSMT LEADCHNL RA SENSING INTR AMPL: 1.5 mV — AB
MDC IDC MSMT LEADCHNL RV IMPEDANCE VALUE: 660 Ohm
MDC IDC MSMT LEADCHNL RV SENSING INTR AMPL: 8 mV
MDC IDC PG SERIAL: 764370
MDC IDC SESS DTM: 20170726040000
MDC IDC SET LEADCHNL RV SENSING SENSITIVITY: 2.5 mV
MDC IDC STAT BRADY RV PERCENT PACED: 4 %

## 2016-06-10 NOTE — Progress Notes (Signed)
Pacemaker check in clinic. Normal device function. Thresholds, sensing, impedances consistent with previous measurements. Device programmed to maximize longevity. 85 ATR episodes (0%), All < 1 min- available EGMs show AT. On Xarelto for PAF. No high ventricular rates noted. Device programmed at appropriate safety margins. Histogram distribution appropriate for patient activity level. Device programmed to optimize intrinsic conduction. Estimated longevity 3 years. ROV with JA in January.

## 2016-06-15 ENCOUNTER — Encounter: Payer: Self-pay | Admitting: Internal Medicine

## 2016-07-10 ENCOUNTER — Other Ambulatory Visit: Payer: Self-pay | Admitting: Internal Medicine

## 2016-07-22 ENCOUNTER — Other Ambulatory Visit: Payer: Self-pay | Admitting: Internal Medicine

## 2016-10-02 ENCOUNTER — Other Ambulatory Visit: Payer: Self-pay | Admitting: Internal Medicine

## 2016-10-06 ENCOUNTER — Telehealth: Payer: Self-pay

## 2016-10-06 MED ORDER — TRAMADOL HCL 50 MG PO TABS: 50.0000 mg | ORAL_TABLET | Freq: Four times a day (QID) | ORAL | 5 refills | Status: DC | PRN

## 2016-10-06 NOTE — Telephone Encounter (Signed)
Please advise patient is needing refill on tramadol

## 2016-10-06 NOTE — Telephone Encounter (Signed)
faxed

## 2016-10-06 NOTE — Telephone Encounter (Signed)
Done hardcopy to Corinne  

## 2016-10-10 ENCOUNTER — Other Ambulatory Visit: Payer: Self-pay | Admitting: Internal Medicine

## 2016-10-12 NOTE — Telephone Encounter (Signed)
Pt left msg on triage stating [facility] has sent request to have tramadol refill, but they have not heard back from MD. Per chart rx was ap[proved and faxed back to pharmacy. [facility] spoke w/Raven verified if rx was received on 11/21. Per Raven they did not receive. Gave verbal authorization to fill from 11/21.,../lmb

## 2016-10-20 ENCOUNTER — Ambulatory Visit (INDEPENDENT_AMBULATORY_CARE_PROVIDER_SITE_OTHER): Payer: Medicare Other | Admitting: Internal Medicine

## 2016-10-20 ENCOUNTER — Ambulatory Visit (INDEPENDENT_AMBULATORY_CARE_PROVIDER_SITE_OTHER)
Admission: RE | Admit: 2016-10-20 | Discharge: 2016-10-20 | Disposition: A | Discharge status: Home / Self Care | Payer: Medicare Other | Source: Ambulatory Visit | Attending: Internal Medicine | Admitting: Internal Medicine

## 2016-10-20 ENCOUNTER — Encounter: Payer: Self-pay | Admitting: Internal Medicine

## 2016-10-20 VITALS — BP 142/72 | HR 63 | Resp 20 | Wt 181.0 lb

## 2016-10-20 DIAGNOSIS — M25551 Pain in right hip: Secondary | ICD-10-CM

## 2016-10-20 DIAGNOSIS — M19071 Primary osteoarthritis, right ankle and foot: Secondary | ICD-10-CM | POA: Diagnosis not present

## 2016-10-20 DIAGNOSIS — M160 Bilateral primary osteoarthritis of hip: Secondary | ICD-10-CM | POA: Diagnosis not present

## 2016-10-20 DIAGNOSIS — M79641 Pain in right hand: Secondary | ICD-10-CM

## 2016-10-20 DIAGNOSIS — M79642 Pain in left hand: Secondary | ICD-10-CM

## 2016-10-20 DIAGNOSIS — M25572 Pain in left ankle and joints of left foot: Secondary | ICD-10-CM

## 2016-10-20 DIAGNOSIS — G8929 Other chronic pain: Secondary | ICD-10-CM

## 2016-10-20 DIAGNOSIS — M25552 Pain in left hip: Secondary | ICD-10-CM | POA: Diagnosis not present

## 2016-10-20 DIAGNOSIS — M19072 Primary osteoarthritis, left ankle and foot: Secondary | ICD-10-CM | POA: Diagnosis not present

## 2016-10-20 DIAGNOSIS — M25571 Pain in right ankle and joints of right foot: Secondary | ICD-10-CM | POA: Diagnosis not present

## 2016-10-20 MED ORDER — DICLOFENAC SODIUM 1 % TD GEL: 4.0000 g | Freq: Four times a day (QID) | TRANSDERMAL | 5 refills | Status: DC | PRN

## 2016-10-20 MED ORDER — TRAMADOL HCL 50 MG PO TABS: 50.0000 mg | ORAL_TABLET | Freq: Four times a day (QID) | ORAL | 5 refills | Status: DC | PRN

## 2016-10-20 NOTE — Progress Notes (Signed)
Subjective:    Patient ID: [name], male    DOB: 02-24-1941, 75 y.o.   MRN: [id number]  HPI  Here to f/u; overall doing ok,  Pt denies chest pain, increasing sob or doe, wheezing, orthopnea, PND, increased LE swelling, palpitations, dizziness or syncope.  Pt denies new neurological symptoms such as new headache, or facial or extremity weakness or numbness.  Pt denies polydipsia, polyuria, or low sugar episode.   Pt denies new neurological symptoms such as new headache, or facial or extremity weakness or numbness.   Pt states overall good compliance with meds, mostly trying to follow appropriate diet, with wt overall stable,  but little exercise however.  Lost wt with better diet and more activity.   Wt Readings from Last 3 Encounters:  10/20/16 181 lb (82.1 kg)  05/10/16 186 lb (84.4 kg)  04/28/16 189 lb (85.7 kg)  BP at home 129/73 earlier today  Up to BR 3-5 times per night despite bid flomax Taking tylenol max dose per day, and tramadol 50 qid prn, but still pain "all over" and worst to hands, ankles, and prox anterior thighs aching.   Past Medical History:  Diagnosis Date  . Abdominal pain, epigastric   . Allergic rhinitis, cause unspecified   . Arthritis   . BPH (benign prostatic hyperplasia)   . CHF (congestive heart failure) ([place])   . Chronic anticoagulation    xarelto  . Coronary artery calcification seen on CAT scan 12/26/2012  . Depressive disorder, not elsewhere classified   . Diverticulosis of colon (without mention of hemorrhage)   . Esophageal reflux   . High risk medication use   . Hyperlipidemia   . Hyperplastic rectal polyp   . Hypertension   . Lumbago   . Pacemaker   . Paralyzed hemidiaphragm    LEFT  . Persistent atrial fibrillation ([place])   . Sick sinus syndrome [facility]) July 2010   s/p PPM [facility]) by [name]   Past Surgical History:  Procedure Laterality Date  . ESOPHAGOGASTRODUODENOSCOPY N/A 12/05/2013   Procedure: ESOPHAGOGASTRODUODENOSCOPY (EGD);   Surgeon: [name], MD;  Location: [facility];  Service: Endoscopy;  Laterality: N/A;  . ESOPHAGOGASTRODUODENOSCOPY N/A 07/04/2015   Procedure: ESOPHAGOGASTRODUODENOSCOPY (EGD);  Surgeon: [name], MD;  Location: [place];  Service: Endoscopy;  Laterality: N/A;  . INSERT / REPLACE / REMOVE PACEMAKER  05/2009    [employer] scientific ALTRUA 60  (serial number [id number]) dual-chamber pacemaker  . PACEMAKER PLACEMENT Bilateral    '05    reports that he quit smoking about 37 years ago. His smoking use included Cigarettes. He has a 12.50 pack-year smoking history. He has quit using smokeless tobacco. His smokeless tobacco use included Chew. He reports that he does not drink alcohol or use drugs. family history includes Arthritis in his paternal grandmother; Asthma in his sister; Cancer in his father; Diabetes in his mother; Hypertension in his brother, daughter, father, mother, sister, and son; Lung cancer in his father. Allergies  Allergen Reactions  . Pantoprazole Sodium Itching   Current Outpatient Prescriptions on File Prior to Visit  Medication Sig Dispense Refill  . acetaminophen (TYLENOL ARTHRITIS PAIN) 650 MG CR tablet Take 650 mg by mouth every 8 (eight) hours as needed for pain.    Marland Kitchen amLODipine (NORVASC) 10 MG tablet TAKE 1 TABLET BY MOUTH EVERY DAY 90 tablet 3  . atorvastatin (LIPITOR) 40 MG tablet TAKE 1 TABLET BY MOUTH EVERY DAY 90 tablet 3  . fexofenadine (  ALLEGRA) 180 MG tablet TAKE 1 TABLET (180 MG TOTAL) BY MOUTH DAILY. 90 tablet 1  . flecainide (TAMBOCOR) 100 MG tablet TAKE 1 TABLET BY MOUTH TWICE A DAY 180 tablet 3  . fluticasone (FLONASE) 50 MCG/ACT nasal spray USE 2 SPRAYS IN EACH NOSTRIL EVERY DAY 16 g 5  . latanoprost (XALATAN) 0.005 % ophthalmic solution Place 1 drop into both eyes daily.    Marland Kitchen omeprazole (PRILOSEC) 20 MG capsule TAKE ONE CAPSULE BY MOUTH TWICE A DAY 60 capsule 3  . oxybutynin (DITROPAN-XL) 10 MG 24 hr tablet TAKE 1 TABLET (10 MG TOTAL) BY MOUTH  DAILY. 90 tablet 1  . promethazine (PHENERGAN) 12.5 MG tablet Take 1 tablet (12.5 mg total) by mouth every 6 (six) hours as needed for nausea or vomiting. 10 tablet 0  . tamsulosin (FLOMAX) 0.4 MG CAPS capsule TAKE ONE CAPSULE BY MOUTH TWICE A DAY 60 capsule 11  . XARELTO 20 MG TABS tablet TAKE 1 TABLET BY MOUTH EVERY DAY 90 tablet 1  . [DISCONTINUED] furosemide (LASIX) 40 MG tablet Take 1 tablet (40 mg total) by mouth daily. 30 tablet 11   No current facility-administered medications on file prior to visit.    Review of Systems  Constitutional: Negative for unusual diaphoresis or night sweats HENT: Negative for ear swelling or discharge Eyes: Negative for worsening visual haziness  Respiratory: Negative for choking and stridor.   Gastrointestinal: Negative for distension or worsening eructation Genitourinary: Negative for retention or change in urine volume.  Musculoskeletal: Negative for other MSK pain or swelling Skin: Negative for color change and worsening wound Neurological: Negative for tremors and numbness other than noted  Psychiatric/Behavioral: Negative for decreased concentration or agitation other than above   All other system neg per pt    Objective:   Physical Exam BP (!) 142/72   Pulse 63   Resp 20   Wt 181 lb (82.1 kg)   SpO2 97%   BMI 26.73 kg/m  VS noted,  Constitutional: Pt appears in no apparent distress HENT: Head: NCAT.  Right Ear: External ear normal.  Left Ear: External ear normal.  Eyes: . Pupils are equal, round, and reactive to light. Conjunctivae and EOM are normal Neck: Normal range of motion. Neck supple.  Cardiovascular: Normal rate and regular rhythm.   Pulmonary/Chest: Effort normal and breath sounds without rales or wheezing.  Abd:  Soft, NT, ND, + BS Neurological: Pt is alert. Not confused , motor grossly intact Skin: Skin is warm. No rash, no LE edema Psychiatric: Pt behavior is normal. No agitation.  No joint swelling or effusions No  other new exam findings    Assessment & Plan:

## 2016-10-20 NOTE — Progress Notes (Signed)
Pre visit review using our clinic review tool, if applicable. No additional management support is needed unless otherwise documented below in the visit note. 

## 2016-10-20 NOTE — Patient Instructions (Addendum)
Please take all new medication as prescribed - the voltaren gel as needed for hand pain or ankle pain  Please continue all other medications as before, and refills have been done if requested - the tramadol  Please have the pharmacy call with any other refills you may need.  Please continue your efforts at being more active, low cholesterol diet, and weight control.  You will be contacted regarding the referral for: Dr Smith/sports medicine for upper leg pain and ankle pain  Please keep your appointments with your specialists as you may have planned  Please go to the XRAY Department in the Basement (go straight as you get off the elevator) for the x-ray testing  You will be contacted by phone if any changes need to be made immediately.  Otherwise, you will receive a letter about your results with an explanation, but please check with MyChart first.  Please remember to sign up for MyChart if you have not done so, as this will be important to you in the future with finding out test results, communicating by private email, and scheduling acute appointments online when needed.  Please return in 6 months, or sooner if needed, with Lab testing done 3-5 days before

## 2016-10-27 NOTE — Assessment & Plan Note (Signed)
C/w DJD, for volt gel prn,  to f/u any worsening symptoms or concerns 

## 2016-10-27 NOTE — Assessment & Plan Note (Addendum)
For volt gel, sport med referral, films today

## 2016-10-27 NOTE — Assessment & Plan Note (Signed)
Etiology unclear, for films asd, sport med referral

## 2016-10-27 NOTE — Assessment & Plan Note (Signed)
For tramadol prn, films asd, sport med referral

## 2016-10-27 NOTE — Assessment & Plan Note (Signed)
Ok for tramadol refill,  to f/u any worsening symptoms or concerns  

## 2016-11-04 ENCOUNTER — Other Ambulatory Visit: Payer: Self-pay | Admitting: Internal Medicine

## 2016-11-11 NOTE — Progress Notes (Addendum)
Subjective:   [name] is a 75 y.o. male who presents for an Initial Medicare Annual Wellness Visit.  The Patient was informed that the wellness visit is to identify future health risk and educate and initiate measures that can reduce risk for increased disease through the lifespan.   Describes health as fair, good or great? 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   Review of Systems  No ROS.  Medicare Wellness Visit.    Sleep patterns: Sleeps well, up 2-3 times to void.  Home Safety/Smoke Alarms:  Smoke detectors in place.  Living environment; residence and Firearm Safety: Lives with wife in 2 story home. Firearms locked away.  Seat Belt Safety/Bike Helmet: wears seat belt.    Counseling:   Eye Exam-Last exam 04/2016, yearly by Dr. Johney Maine  Dental-Full dentures. Dr. Archie Balboa for gum exams  Male:   [facility]-colonoscopy 11/29/2007, polyps. Recall 7 years. Referral placed.       PSA-04/28/2016,  1.51.     Objective:    There were no vitals filed for this visit. There is no height or weight on file to calculate BMI.  Current Medications (verified) Outpatient Encounter Prescriptions as of 11/12/2016  Medication Sig  . acetaminophen (TYLENOL ARTHRITIS PAIN) 650 MG CR tablet Take 650 mg by mouth every 8 (eight) hours as needed for pain.  Marland Kitchen amLODipine (NORVASC) 10 MG tablet TAKE 1 TABLET BY MOUTH EVERY DAY  . atorvastatin (LIPITOR) 40 MG tablet TAKE 1 TABLET BY MOUTH EVERY DAY  . diclofenac sodium (VOLTAREN) 1 % GEL Apply 4 g topically 4 (four) times daily as needed.  . fexofenadine (ALLEGRA) 180 MG tablet TAKE 1 TABLET (180 MG TOTAL) BY MOUTH DAILY.  . flecainide (TAMBOCOR) 100 MG tablet TAKE 1 TABLET BY MOUTH TWICE A DAY  . fluticasone (FLONASE) 50 MCG/ACT nasal spray USE 2 SPRAYS IN EACH NOSTRIL EVERY DAY  . latanoprost (XALATAN) 0.005 % ophthalmic solution Place 1 drop into both eyes daily.  Marland Kitchen omeprazole (PRILOSEC) 20 MG capsule TAKE ONE CAPSULE BY MOUTH TWICE A DAY  . oxybutynin  (DITROPAN-XL) 10 MG 24 hr tablet TAKE 1 TABLET (10 MG TOTAL) BY MOUTH DAILY.  Marland Kitchen promethazine (PHENERGAN) 12.5 MG tablet Take 1 tablet (12.5 mg total) by mouth every 6 (six) hours as needed for nausea or vomiting.  . tamsulosin (FLOMAX) 0.4 MG CAPS capsule TAKE ONE CAPSULE BY MOUTH TWICE A DAY  . traMADol (ULTRAM) 50 MG tablet Take 1 tablet (50 mg total) by mouth every 6 (six) hours as needed. for pain  . XARELTO 20 MG TABS tablet TAKE 1 TABLET BY MOUTH EVERY DAY   No facility-administered encounter medications on file as of 11/12/2016.     Allergies (verified) Pantoprazole sodium   History: Past Medical History:  Diagnosis Date  . Abdominal pain, epigastric   . Allergic rhinitis, cause unspecified   . Arthritis   . BPH (benign prostatic hyperplasia)   . CHF (congestive heart failure) ([place])   . Chronic anticoagulation    xarelto  . Coronary artery calcification seen on CAT scan 12/26/2012  . Depressive disorder, not elsewhere classified   . Diverticulosis of colon (without mention of hemorrhage)   . Esophageal reflux   . High risk medication use   . Hyperlipidemia   . Hyperplastic rectal polyp   . Hypertension   . Lumbago   . Pacemaker   . Paralyzed hemidiaphragm    LEFT  . Persistent atrial fibrillation ([place])   . Sick sinus syndrome ([place])  July 2010   s/p PPM [facility]) by [name]   Past Surgical History:  Procedure Laterality Date  . ESOPHAGOGASTRODUODENOSCOPY N/A 12/05/2013   Procedure: ESOPHAGOGASTRODUODENOSCOPY (EGD);  Surgeon: [name], MD;  Location: [facility];  Service: Endoscopy;  Laterality: N/A;  . ESOPHAGOGASTRODUODENOSCOPY N/A 07/04/2015   Procedure: ESOPHAGOGASTRODUODENOSCOPY (EGD);  Surgeon: [name], MD;  Location: [facility];  Service: Endoscopy;  Laterality: N/A;  . INSERT / REPLACE / REMOVE PACEMAKER  05/2009    [employer] ALTRUA 60  (serial number [id number]) dual-chamber pacemaker  . PACEMAKER PLACEMENT Bilateral    '05   Family  History  Problem Relation Age of Onset  . Hypertension Father   . Lung cancer Father   . Cancer Father   . Asthma Sister   . Hypertension Sister   . Arthritis Paternal Grandmother   . Diabetes Mother   . Hypertension Mother   . Hypertension Brother   . Hypertension Daughter   . Hypertension Son    Social History   Occupational History  . retired Retired   Social History Main Topics  . Smoking status: Former Smoker    Packs/day: 0.50    Years: 25.00    Types: Cigarettes    Quit date: 11/16/1978  . Smokeless tobacco: Former Systems developer    Types: Chew  . Alcohol use No  . Drug use: No  . Sexual activity: Not on file   Tobacco Counseling Counseling given: Not Answered   Activities of Daily Living No flowsheet data found.  Immunizations and Health Maintenance Immunization History  Administered Date(s) Administered  . Influenza Split 08/16/2012, 08/17/2012, 08/16/2013  . Influenza Whole 08/16/2010, 08/17/2011  . Influenza-Unspecified 08/11/2015  . Pneumococcal Conjugate-13 09/26/2013  . Pneumococcal Polysaccharide-23 10/25/2006, 09/16/2010  . Td 05/27/2009  . Zoster 10/25/2006   Health Maintenance Due  Topic Date Due  . INFLUENZA VACCINE  06/16/2016    Patient Care Team: Biagio Borg, MD as PCP - General Thompson Grayer, MD as Attending Physician (Cardiology)  Indicate any recent Medical Services you may have received from other than [facility] providers in the past year (date may be approximate).    Assessment:   This is a routine wellness examination for [name]. Physical assessment deferred to PCP.   Hearing/Vision screen No exam data present  Dietary issues and exercise activities discussed:   Diet (meal preparation, eat out, water intake, caffeinated beverages, dairy products, fruits and vegetables): Eats at home. Drinks kool-aid (with sugar), water and soda.  Breakfast: sandwich, coffee Lunch: snacks on fruit, sweets Dinner: meat and vegetables, some fried.       Discussed low cholesterol, heart healthy diet. Encouraged increasing water intake. Discussed remaining active and increasing exercise as tolerated.   Goals    None     Depression Screen PHQ 2/9 Scores 04/28/2016 01/28/2016 01/01/2015 06/26/2014  PHQ - 2 Score 0 0 1 0    Fall Risk Fall Risk  04/28/2016 01/28/2016 01/01/2015 06/26/2014 12/26/2013  Falls in the past year? No No No No No    Cognitive Function:       Ad8 score reviewed for issues:  Issues making decisions:no  Less interest in hobbies / activities:no  Repeats questions, stories (family complaining):no  Trouble using ordinary gadgets (microwave, computer, phone):no  Forgets the month or year: no  Mismanaging finances: no  Remembering appts:no  Daily problems with thinking and/or memory:no Ad8 score is=0     Screening Tests Health Maintenance  Topic Date Due  . INFLUENZA  VACCINE  06/16/2016  . COLONOSCOPY  11/28/2017  . TETANUS/TDAP  05/28/2019  . ZOSTAVAX  Completed  . PNA vac Low Risk Adult  Completed        Plan:     Eat heart healthy diet (full of fruits, vegetables, whole grains, lean protein, water--limit salt, fat, and sugar intake) and increase physical activity as tolerated.  Start doing brain stimulating activities (puzzles, reading, adult coloring books, staying active) to keep memory sharp.   Make appointment with Urology.  Make appointment with dentist. Schedule colonoscopy.   **FYI-pt reports dizziness x 1 month. Advised to track Bp's, appointment scheduled for next week.   During the course of the visit [name] was educated and counseled about the following appropriate screening and preventive services:   Vaccines to include Pneumoccal, Influenza, Hepatitis B, Td, Zostavax, HCV  Colorectal cancer screening  Cardiovascular disease screening  Diabetes screening  Glaucoma screening  Nutrition counseling  Prostate cancer screening   Patient Instructions (the written plan)  were given to the patient.   Gerilyn Nestle, RN   11/11/2016    Medical screening examination/treatment/procedure(s) were performed by non-physician practitioner and as supervising physician I was immediately available for consultation/collaboration. I agree with above. Cathlean Cower, MD

## 2016-11-12 ENCOUNTER — Ambulatory Visit (INDEPENDENT_AMBULATORY_CARE_PROVIDER_SITE_OTHER): Payer: Medicare Other

## 2016-11-12 VITALS — BP 118/70 | HR 68 | Ht 69.0 in | Wt 190.0 lb

## 2016-11-12 DIAGNOSIS — Z1211 Encounter for screening for malignant neoplasm of colon: Secondary | ICD-10-CM

## 2016-11-12 DIAGNOSIS — Z0000 Encounter for general adult medical examination without abnormal findings: Secondary | ICD-10-CM | POA: Diagnosis not present

## 2016-11-12 NOTE — Patient Instructions (Addendum)
Eat heart healthy diet (full of fruits, vegetables, whole grains, lean protein, water--limit salt, fat, and sugar intake) and increase physical activity as tolerated.  Start doing brain stimulating activities (puzzles, reading, adult coloring books, staying active) to keep memory sharp.   Make appointment with Urology.  Make appointment with dentist. Schedule colonoscopy.   Fat and Cholesterol Restricted Diet High levels of fat and cholesterol in your blood may lead to various health problems, such as diseases of the heart, blood vessels, gallbladder, liver, and pancreas. Fats are concentrated sources of energy that come in various forms. Certain types of fat, including saturated fat, may be harmful in excess. Cholesterol is a substance needed by your body in small amounts. Your body makes all the cholesterol it needs. Excess cholesterol comes from the food you eat. When you have high levels of cholesterol and saturated fat in your blood, health problems can develop because the excess fat and cholesterol will gather along the walls of your blood vessels, causing them to narrow. Choosing the right foods will help you control your intake of fat and cholesterol. This will help keep the levels of these substances in your blood within normal limits and reduce your risk of disease. What is my plan? Your health care provider recommends that you:  Limit your fat intake to ______% or less of your total calories per day.  Limit the amount of cholesterol in your diet to less than _________mg per day.  Eat 20-30 grams of fiber each day. What types of fat should I choose?  Choose healthy fats more often. Choose monounsaturated and polyunsaturated fats, such as olive and canola oil, flaxseeds, walnuts, almonds, and seeds.  Eat more omega-3 fats. Good choices include salmon, mackerel, sardines, tuna, flaxseed oil, and ground flaxseeds. Aim to eat fish at least two times a week.  Limit saturated fats.  Saturated fats are primarily found in animal products, such as meats, butter, and cream. Plant sources of saturated fats include palm oil, palm kernel oil, and coconut oil.  Avoid foods with partially hydrogenated oils in them. These contain trans fats. Examples of foods that contain trans fats are stick margarine, some tub margarines, cookies, crackers, and other baked goods. What general guidelines do I need to follow? These guidelines for healthy eating will help you control your intake of fat and cholesterol:  Check food labels carefully to identify foods with trans fats or high amounts of saturated fat.  Fill one half of your plate with vegetables and green salads.  Fill one fourth of your plate with whole grains. Look for the word "whole" as the first word in the ingredient list.  Fill one fourth of your plate with lean protein foods.  Limit fruit to two servings a day. Choose fruit instead of juice.  Eat more foods that contain fiber, such as apples, broccoli, carrots, beans, peas, and barley.  Eat more home-cooked food and less restaurant, buffet, and fast food.  Limit or avoid alcohol.  Limit foods high in starch and sugar.  Limit fried foods.  Cook foods using methods other than frying. Baking, boiling, grilling, and broiling are all great options.  Lose weight if you are overweight. Losing just 5-10% of your initial body weight can help your overall health and prevent diseases such as diabetes and heart disease. What foods can I eat? Grains  Whole grains, such as whole wheat or whole grain breads, crackers, cereals, and pasta. Unsweetened oatmeal, bulgur, barley, quinoa, or brown rice. Corn or  whole wheat flour tortillas. Vegetables  Fresh or frozen vegetables (raw, steamed, roasted, or grilled). Green salads. Fruits  All fresh, canned (in natural juice), or frozen fruits. Meats and other protein foods  Ground beef (85% or leaner), grass-fed beef, or beef trimmed  of fat. Skinless chicken or [place]. Ground chicken or [place]. Pork trimmed of fat. All fish and seafood. Eggs. Dried beans, peas, or lentils. Unsalted nuts or seeds. Unsalted canned or dry beans. Dairy  Low-fat dairy products, such as skim or 1% milk, 2% or reduced-fat cheeses, low-fat ricotta or cottage cheese, or plain low-fat yo Fats and oils  Tub margarines without trans fats. Light or reduced-fat mayonnaise and salad dressings. Avocado. Olive, canola, sesame, or safflower oils. Natural peanut or almond butter (choose ones without added sugar and oil). The items listed above may not be a complete list of recommended foods or beverages. Contact your dietitian for more options.  Foods to avoid Grains  White bread. White pasta. White rice. Cornbread. Bagels, pastries, and croissants. Crackers that contain trans fat. Vegetables  White potatoes. Corn. Creamed or fried vegetables. Vegetables in a cheese sauce. Fruits  Dried fruits. Canned fruit in light or heavy syrup. Fruit juice. Meats and other protein foods  Fatty cuts of meat. Ribs, chicken wings, bacon, sausage, bologna, salami, chitterlings, fatback, hot dogs, bratwurst, and packaged luncheon meats. Liver and organ meats. Dairy  Whole or 2% milk, cream, half-and-half, and cream cheese. Whole milk cheeses. Whole-fat or sweetened yogurt. Full-fat cheeses. Nondairy creamers and whipped toppings. Processed cheese, cheese spreads, or cheese curds. Beverages  Alcohol. Sweetened drinks (such as sodas, lemonade, and fruit drinks or punches). Fats and oils  Butter, stick margarine, lard, shortening, ghee, or bacon fat. Coconut, palm kernel, or palm oils. Sweets and desserts  Corn syrup, sugars, honey, and molasses. Candy. Jam and jelly. Syrup. Sweetened cereals. Cookies, pies, cakes, donuts, muffins, and ice cream. The items listed above may not be a complete list of foods and beverages to avoid. Contact your dietitian for more  information.  This information is not intended to replace advice given to you by your health care provider. Make sure you discuss any questions you have with your health care provider. Document Released: 11/02/2005 Document Revised: 11/23/2014 Document Reviewed: 01/31/2014 Elsevier Interactive Patient Education  2017 [place] Prevention in the Home Introduction Falls can cause injuries. They can happen to people of all ages. There are many things you can do to make your home safe and to help prevent falls. What can I do on the outside of my home?  Regularly fix the edges of walkways and driveways and fix any cracks.  Remove anything that might make you trip as you walk through a door, such as a raised step or threshold.  Trim any bushes or trees on the path to your home.  Use bright outdoor lighting.  Clear any walking paths of anything that might make someone trip, such as rocks or tools.  Regularly check to see if handrails are loose or broken. Make sure that both sides of any steps have handrails.  Any raised decks and porches should have guardrails on the edges.  Have any leaves, snow, or ice cleared regularly.  Use sand or salt on walking paths during winter.  Clean up any spills in your garage right away. This includes oil or grease spills. What can I do in the bathroom?  Use night lights.  Install grab bars by the toilet and in  the tub and shower. Do not use towel bars as grab bars.  Use non-skid mats or decals in the tub or shower.  If you need to sit down in the shower, use a plastic, non-slip stool.  Keep the floor dry. Clean up any water that spills on the floor as soon as it happens.  Remove soap buildup in the tub or shower regularly.  Attach bath mats securely with double-sided non-slip rug tape.  Do not have throw rugs and other things on the floor that can make you trip. What can I do in the bedroom?  Use night lights.  Make sure that  you have a light by your bed that is easy to reach.  Do not use any sheets or blankets that are too big for your bed. They should not hang down onto the floor.  Have a firm chair that has side arms. You can use this for support while you get dressed.  Do not have throw rugs and other things on the floor that can make you trip. What can I do in the kitchen?  Clean up any spills right away.  Avoid walking on wet floors.  Keep items that you use a lot in easy-to-reach places.  If you need to reach something above you, use a strong step stool that has a grab bar.  Keep electrical cords out of the way.  Do not use floor polish or wax that makes floors slippery. If you must use wax, use non-skid floor wax.  Do not have throw rugs and other things on the floor that can make you trip. What can I do with my stairs?  Do not leave any items on the stairs.  Make sure that there are handrails on both sides of the stairs and use them. Fix handrails that are broken or loose. Make sure that handrails are as long as the stairways.  Check any carpeting to make sure that it is firmly attached to the stairs. Fix any carpet that is loose or worn.  Avoid having throw rugs at the top or bottom of the stairs. If you do have throw rugs, attach them to the floor with carpet tape.  Make sure that you have a light switch at the top of the stairs and the bottom of the stairs. If you do not have them, ask someone to add them for you. What else can I do to help prevent falls?  Wear shoes that:  Do not have high heels.  Have rubber bottoms.  Are comfortable and fit you well.  Are closed at the toe. Do not wear sandals.  If you use a stepladder:  Make sure that it is fully opened. Do not climb a closed stepladder.  Make sure that both sides of the stepladder are locked into place.  Ask someone to hold it for you, if possible.  Clearly mark and make sure that you can see:  Any grab bars or  handrails.  First and last steps.  Where the edge of each step is.  Use tools that help you move around (mobility aids) if they are needed. These include:  Canes.  Walkers.  Scooters.  Crutches.  Turn on the lights when you go into a dark area. Replace any light bulbs as soon as they burn out.  Set up your furniture so you have a clear path. Avoid moving your furniture around.  If any of your floors are uneven, fix them.  If there are any  pets around you, be aware of where they are.  Review your medicines with your doctor. Some medicines can make you feel dizzy. This can increase your chance of falling. Ask your doctor what other things that you can do to help prevent falls. This information is not intended to replace advice given to you by your health care provider. Make sure you discuss any questions you have with your health care provider. Document Released: 08/29/2009 Document Revised: 04/09/2016 Document Reviewed: 12/07/2014  2017 Elsevier  Health Maintenance, Male A healthy lifestyle and preventative care can promote health and wellness.  Maintain regular health, dental, and eye exams.  Eat a healthy diet. Foods like vegetables, fruits, whole grains, low-fat dairy products, and lean protein foods contain the nutrients you need and are low in calories. Decrease your intake of foods high in solid fats, added sugars, and salt. Get information about a proper diet from your health care provider, if necessary.  Regular physical exercise is one of the most important things you can do for your health. Most adults should get at least 150 minutes of moderate-intensity exercise (any activity that increases your heart rate and causes you to sweat) each week. In addition, most adults need muscle-strengthening exercises on 2 or more days a week.   Maintain a healthy weight. The body mass index (BMI) is a screening tool to identify possible weight problems. It provides an estimate of  body fat based on height and weight. Your health care provider can find your BMI and can help you achieve or maintain a healthy weight. For males 20 years and older:  A BMI below 18.5 is considered underweight.  A BMI of 18.5 to 24.9 is normal.  A BMI of 25 to 29.9 is considered overweight.  A BMI of 30 and above is considered obese.  Maintain normal blood lipids and cholesterol by exercising and minimizing your intake of saturated fat. Eat a balanced diet with plenty of fruits and vegetables. Blood tests for lipids and cholesterol should begin at age 41 and be repeated every 5 years. If your lipid or cholesterol levels are high, you are over age 26, or you are at high risk for heart disease, you may need your cholesterol levels checked more frequently.Ongoing high lipid and cholesterol levels should be treated with medicines if diet and exercise are not working.  If you smoke, find out from your health care provider how to quit. If you do not use tobacco, do not start.  Lung cancer screening is recommended for adults aged 78-80 years who are at high risk for developing lung cancer because of a history of smoking. A yearly low-dose CT scan of the lungs is recommended for people who have at least a 30-pack-year history of smoking and are current smokers or have quit within the past 15 years. A pack year of smoking is smoking an average of 1 pack of cigarettes a day for 1 year (for example, a 30-pack-year history of smoking could mean smoking 1 pack a day for 30 years or 2 packs a day for 15 years). Yearly screening should continue until the smoker has stopped smoking for at least 15 years. Yearly screening should be stopped for people who develop a health problem that would prevent them from having lung cancer treatment.  If you choose to drink alcohol, do not have more than 2 drinks per day. One drink is considered to be 12 oz (360 mL) of beer, 5 oz (150 mL) of wine, or  1.5 oz (45 mL) of  liquor.  Avoid the use of street drugs. Do not share needles with anyone. Ask for help if you need support or instructions about stopping the use of drugs.  High blood pressure causes heart disease and increases the risk of stroke. High blood pressure is more likely to develop in:  People who have blood pressure in the end of the normal range (100-139/85-89 mm Hg).  People who are overweight or obese.  People who are African American.  If you are 25-20 years of age, have your blood pressure checked every 3-5 years. If you are 42 years of age or older, have your blood pressure checked every year. You should have your blood pressure measured twice-once when you are at a hospital or clinic, and once when you are not at a hospital or clinic. Record the average of the two measurements. To check your blood pressure when you are not at a hospital or clinic, you can use:  An automated blood pressure machine at a pharmacy.  A home blood pressure monitor.  If you are 48-12 years old, ask your health care provider if you should take aspirin to prevent heart disease.  Diabetes screening involves taking a blood sample to check your fasting blood sugar level. This should be done once every 3 years after age 44 if you are at a normal weight and without risk factors for diabetes. Testing should be considered at a younger age or be carried out more frequently if you are overweight and have at least 1 risk factor for diabetes.  Colorectal cancer can be detected and often prevented. Most routine colorectal cancer screening begins at the age of 56 and continues through age 29. However, your health care provider may recommend screening at an earlier age if you have risk factors for colon cancer. On a yearly basis, your health care provider may provide home test kits to check for hidden blood in the stool. A small camera at the end of a tube may be used to directly examine the colon (sigmoidoscopy or colonoscopy) to  detect the earliest forms of colorectal cancer. Talk to your health care provider about this at age 24 when routine screening begins. A direct exam of the colon should be repeated every 5-10 years through age 6, unless early forms of precancerous polyps or small growths are found.  People who are at an increased risk for hepatitis B should be screened for this virus. You are considered at high risk for hepatitis B if:  You were born in a country where hepatitis B occurs often. Talk with your health care provider about which countries are considered high risk.  Your parents were born in a high-risk country and you have not received a shot to protect against hepatitis B (hepatitis B vaccine).  You have HIV or AIDS.  You use needles to inject street drugs.  You live with, or have sex with, someone who has hepatitis B.  You are a man who has sex with other men (MSM).  You get hemodialysis treatment.  You take certain medicines for conditions like cancer, organ transplantation, and autoimmune conditions.  Hepatitis C blood testing is recommended for all people born from [birth year] through 1963 and any individual with known risk factors for hepatitis C.  Healthy men should no longer receive prostate-specific antigen (PSA) blood tests as part of routine cancer screening. Talk to your health care provider about prostate cancer screening.  Testicular cancer screening is not  recommended for adolescents or adult males who have no symptoms. Screening includes self-exam, a health care provider exam, and other screening tests. Consult with your health care provider about any symptoms you have or any concerns you have about testicular cancer.  Practice safe sex. Use condoms and avoid high-risk sexual practices to reduce the spread of sexually transmitted infections (STIs).  You should be screened for STIs, including gonorrhea and chlamydia if:  You are sexually active and are younger than 24 years.  You  are older than 24 years, and your health care provider tells you that you are at risk for this type of infection.  Your sexual activity has changed since you were last screened, and you are at an increased risk for chlamydia or gonorrhea. Ask your health care provider if you are at risk.  If you are at risk of being infected with HIV, it is recommended that you take a prescription medicine daily to prevent HIV infection. This is called pre-exposure prophylaxis (PrEP). You are considered at risk if:  You are a man who has sex with other men (MSM).  You are a heterosexual man who is sexually active with multiple partners.  You take drugs by injection.  You are sexually active with a partner who has HIV.  Talk with your health care provider about whether you are at high risk of being infected with HIV. If you choose to begin PrEP, you should first be tested for HIV. You should then be tested every 3 months for as long as you are taking PrEP.  Use sunscreen. Apply sunscreen liberally and repeatedly throughout the day. You should seek shade when your shadow is shorter than you. Protect yourself by wearing long sleeves, pants, a wide-brimmed hat, and sunglasses year round whenever you are outdoors.  Tell your health care provider of new moles or changes in moles, especially if there is a change in shape or color. Also, tell your health care provider if a mole is larger than the size of a pencil eraser.  A one-time screening for abdominal aortic aneurysm (AAA) and surgical repair of large AAAs by ultrasound is recommended for men aged 32-75 years who are current or former smokers.  Stay current with your vaccines (immunizations). This information is not intended to replace advice given to you by your health care provider. Make sure you discuss any questions you have with your health care provider. Document Released: 04/30/2008 Document Revised: 11/23/2014 Document Reviewed: 08/06/2015 Elsevier  Interactive Patient Education  2017 [employer].

## 2016-11-12 NOTE — Progress Notes (Signed)
Pre visit review using our clinic review tool, if applicable. No additional management support is needed unless otherwise documented below in the visit note. 

## 2016-11-18 ENCOUNTER — Other Ambulatory Visit (INDEPENDENT_AMBULATORY_CARE_PROVIDER_SITE_OTHER): Payer: Medicare Other

## 2016-11-18 ENCOUNTER — Ambulatory Visit (INDEPENDENT_AMBULATORY_CARE_PROVIDER_SITE_OTHER): Payer: Medicare Other | Admitting: Internal Medicine

## 2016-11-18 ENCOUNTER — Encounter: Payer: Self-pay | Admitting: Internal Medicine

## 2016-11-18 VITALS — BP 128/80 | HR 87 | Temp 98.5°F | Resp 20 | Wt 189.0 lb

## 2016-11-18 DIAGNOSIS — R42 Dizziness and giddiness: Secondary | ICD-10-CM | POA: Diagnosis not present

## 2016-11-18 DIAGNOSIS — K219 Gastro-esophageal reflux disease without esophagitis: Secondary | ICD-10-CM

## 2016-11-18 DIAGNOSIS — N320 Bladder-neck obstruction: Secondary | ICD-10-CM | POA: Diagnosis not present

## 2016-11-18 DIAGNOSIS — I10 Essential (primary) hypertension: Secondary | ICD-10-CM | POA: Diagnosis not present

## 2016-11-18 DIAGNOSIS — R351 Nocturia: Secondary | ICD-10-CM | POA: Diagnosis not present

## 2016-11-18 DIAGNOSIS — R739 Hyperglycemia, unspecified: Secondary | ICD-10-CM

## 2016-11-18 LAB — PSA: PSA: 2.05 ng/mL (ref 0.10–4.00)

## 2016-11-18 LAB — LIPID PANEL
CHOLESTEROL: 201 mg/dL — AB (ref 0–200)
HDL: 80.4 mg/dL (ref >39.00)
LDL CALC: 105 mg/dL — AB (ref 0–99)
NONHDL: 121.04
Total CHOL/HDL Ratio: 3
Triglycerides: 81 mg/dL (ref 0.0–149.0)
VLDL: 16.2 mg/dL (ref 0.0–40.0)

## 2016-11-18 LAB — CBC WITH DIFFERENTIAL/PLATELET
BASOS ABS: 0 10*3/uL (ref 0.0–0.1)
Basophils Relative: 0.4 % (ref 0.0–3.0)
EOS ABS: 0.1 10*3/uL (ref 0.0–0.7)
Eosinophils Relative: 0.7 % (ref 0.0–5.0)
HEMATOCRIT: 46.3 % (ref 39.0–52.0)
Hemoglobin: 15.6 g/dL (ref 13.0–17.0)
LYMPHS ABS: 2.2 10*3/uL (ref 0.7–4.0)
LYMPHS PCT: 28.4 % (ref 12.0–46.0)
MCHC: 33.7 g/dL (ref 30.0–36.0)
MCV: 89.5 fl (ref 78.0–100.0)
MONOS PCT: 10.8 % (ref 3.0–12.0)
Monocytes Absolute: 0.8 10*3/uL (ref 0.1–1.0)
NEUTROS ABS: 4.6 10*3/uL (ref 1.4–7.7)
NEUTROS PCT: 59.7 % (ref 43.0–77.0)
PLATELETS: 213 10*3/uL (ref 150.0–400.0)
RBC: 5.17 Mil/uL (ref 4.22–5.81)
RDW: 14.3 % (ref 11.5–15.5)
WBC: 7.8 10*3/uL (ref 4.0–10.5)

## 2016-11-18 LAB — HEPATIC FUNCTION PANEL
ALK PHOS: 72 U/L (ref 39–117)
ALT: 28 U/L (ref 0–53)
AST: 21 U/L (ref 0–37)
Albumin: 4.7 g/dL (ref 3.5–5.2)
BILIRUBIN DIRECT: 0.1 mg/dL (ref 0.0–0.3)
Total Bilirubin: 0.7 mg/dL (ref 0.2–1.2)
Total Protein: 7.1 g/dL (ref 6.0–8.3)

## 2016-11-18 LAB — URINALYSIS, ROUTINE W REFLEX MICROSCOPIC
BILIRUBIN URINE: NEGATIVE
Hgb urine dipstick: NEGATIVE
KETONES UR: NEGATIVE
Leukocytes, UA: NEGATIVE
Nitrite: NEGATIVE
PH: 6 (ref 5.0–8.0)
RBC / HPF: NONE SEEN (ref 0–?)
Specific Gravity, Urine: 1.02 (ref 1.000–1.030)
TOTAL PROTEIN, URINE-UPE24: NEGATIVE
UROBILINOGEN UA: 0.2 (ref 0.0–1.0)
Urine Glucose: NEGATIVE
WBC, UA: NONE SEEN (ref 0–?)

## 2016-11-18 LAB — BASIC METABOLIC PANEL
BUN: 15 mg/dL (ref 6–23)
CALCIUM: 9.7 mg/dL (ref 8.4–10.5)
CO2: 27 meq/L (ref 19–32)
Chloride: 100 mEq/L (ref 96–112)
Creatinine, Ser: 0.95 mg/dL (ref 0.40–1.50)
GFR: 99.78 mL/min (ref >60.00)
Glucose, Bld: 110 mg/dL — ABNORMAL HIGH (ref 70–99)
POTASSIUM: 4.1 meq/L (ref 3.5–5.1)
SODIUM: 138 meq/L (ref 135–145)

## 2016-11-18 LAB — HEMOGLOBIN A1C: HEMOGLOBIN A1C: 6.2 % (ref 4.6–6.5)

## 2016-11-18 LAB — TSH: TSH: 0.93 u[IU]/mL (ref 0.35–4.50)

## 2016-11-18 MED ORDER — OMEPRAZOLE 40 MG PO CPDR: 40.0000 mg | DELAYED_RELEASE_CAPSULE | Freq: Every day | ORAL | 3 refills | Status: DC

## 2016-11-18 MED ORDER — AMLODIPINE BESYLATE 5 MG PO TABS: 5.0000 mg | ORAL_TABLET | Freq: Every day | ORAL | 3 refills | Status: DC

## 2016-11-18 NOTE — Progress Notes (Signed)
Pre visit review using our clinic review tool, if applicable. No additional management support is needed unless otherwise documented below in the visit note. 

## 2016-11-18 NOTE — Progress Notes (Signed)
Subjective:    Patient ID: [name], male    DOB: 1941-01-22, 76 y.o.   MRN: [id number]  HPI  Here with dizzy lightheaded, onset approx > 10 days, mild, intermttent without falls or frank syncope;  Pt denies chest pain, increased sob or doe, wheezing, orthopnea, PND, increased LE swelling, palpitations, though does have stable dyspnea with talking in the house, but not necessarily outsiede walking around.  BP's at home since dec 29 range sbp 106-120, DBP 72-79. With HR 60-70.  Last PPM every 6 mo, thinks due about Dec 11, 2016.  Good med compliance.  Wt has overall increased with the holidays and admits to dietary claorie excess.  No recent med changes.  No overt bleeding except trace with nasal secretions.  Remains on xarelto.  Also mentions freq breakthrough reflux on prilosec 20 mg otc, asks for incresaed med. Wt Readings from Last 3 Encounters:  11/18/16 189 lb (85.7 kg)  11/12/16 190 lb 0.6 oz (86.2 kg)  10/20/16 181 lb (82.1 kg)  Also mentions nocturia 3-4 times at night despite taking the flomax, asks for urology referral No fever, pain, blood in urine Past Medical History:  Diagnosis Date  . Abdominal pain, epigastric   . Allergic rhinitis, cause unspecified   . Arthritis   . BPH (benign prostatic hyperplasia)   . CHF (congestive heart failure) ([place])   . Chronic anticoagulation    xarelto  . Coronary artery calcification seen on CAT scan 12/26/2012  . Depressive disorder, not elsewhere classified   . Diverticulosis of colon (without mention of hemorrhage)   . Esophageal reflux   . High risk medication use   . Hyperlipidemia   . Hyperplastic rectal polyp   . Hypertension   . Lumbago   . Pacemaker   . Paralyzed hemidiaphragm    LEFT  . Persistent atrial fibrillation ([place])   . Sick sinus syndrome [facility]) July 2010   s/p PPM [facility]) by [name]   Past Surgical History:  Procedure Laterality Date  . ESOPHAGOGASTRODUODENOSCOPY N/A 12/05/2013   Procedure:  ESOPHAGOGASTRODUODENOSCOPY (EGD);  Surgeon: [name], MD;  Location: [facility];  Service: Endoscopy;  Laterality: N/A;  . ESOPHAGOGASTRODUODENOSCOPY N/A 07/04/2015   Procedure: ESOPHAGOGASTRODUODENOSCOPY (EGD);  Surgeon: [name], MD;  Location: [place];  Service: Endoscopy;  Laterality: N/A;  . INSERT / REPLACE / REMOVE PACEMAKER  05/2009    [employer] ALTRUA 60  (serial number [id number]) dual-chamber pacemaker  . PACEMAKER PLACEMENT Bilateral    '05    reports that he quit smoking about 38 years ago. His smoking use included Cigarettes. He has a 12.50 pack-year smoking history. He has quit using smokeless tobacco. His smokeless tobacco use included Chew. He reports that he does not drink alcohol or use drugs. family history includes Arthritis in his paternal grandmother; Asthma in his sister; Cancer in his father; Diabetes in his mother; Hypertension in his brother, daughter, father, mother, sister, and son; Lung cancer in his father. Allergies  Allergen Reactions  . Pantoprazole Sodium Itching   Current Outpatient Prescriptions on File Prior to Visit  Medication Sig Dispense Refill  . acetaminophen (TYLENOL ARTHRITIS PAIN) 650 MG CR tablet Take 650 mg by mouth every 8 (eight) hours as needed for pain.    Marland Kitchen amLODipine (NORVASC) 10 MG tablet TAKE 1 TABLET BY MOUTH EVERY DAY 90 tablet 3  . atorvastatin (LIPITOR) 40 MG tablet TAKE 1 TABLET BY MOUTH EVERY DAY 90 tablet 3  . diclofenac  sodium (VOLTAREN) 1 % GEL Apply 4 g topically 4 (four) times daily as needed. 400 g 5  . fexofenadine (ALLEGRA) 180 MG tablet TAKE 1 TABLET (180 MG TOTAL) BY MOUTH DAILY. 90 tablet 1  . flecainide (TAMBOCOR) 100 MG tablet TAKE 1 TABLET BY MOUTH TWICE A DAY 180 tablet 3  . fluticasone (FLONASE) 50 MCG/ACT nasal spray USE 2 SPRAYS IN EACH NOSTRIL EVERY DAY 16 g 5  . latanoprost (XALATAN) 0.005 % ophthalmic solution Place 1 drop into both eyes daily.    Marland Kitchen omeprazole (PRILOSEC) 20 MG capsule  TAKE ONE CAPSULE BY MOUTH TWICE A DAY 60 capsule 3  . oxybutynin (DITROPAN-XL) 10 MG 24 hr tablet TAKE 1 TABLET (10 MG TOTAL) BY MOUTH DAILY. 90 tablet 1  . promethazine (PHENERGAN) 12.5 MG tablet Take 1 tablet (12.5 mg total) by mouth every 6 (six) hours as needed for nausea or vomiting. 10 tablet 0  . tamsulosin (FLOMAX) 0.4 MG CAPS capsule TAKE ONE CAPSULE BY MOUTH TWICE A DAY 60 capsule 11  . traMADol (ULTRAM) 50 MG tablet Take 1 tablet (50 mg total) by mouth every 6 (six) hours as needed. for pain 120 tablet 5  . XARELTO 20 MG TABS tablet TAKE 1 TABLET BY MOUTH EVERY DAY 90 tablet 1  . [DISCONTINUED] furosemide (LASIX) 40 MG tablet Take 1 tablet (40 mg total) by mouth daily. 30 tablet 11   No current facility-administered medications on file prior to visit.    Review of Systems  Constitutional: Negative for unusual diaphoresis or night sweats HENT: Negative for ear swelling or discharge Eyes: Negative for worsening visual haziness  Respiratory: Negative for choking and stridor.   Gastrointestinal: Negative for distension or worsening eructation Genitourinary: Negative for retention or change in urine volume.  Musculoskeletal: Negative for other MSK pain or swelling Skin: Negative for color change and worsening wound Neurological: Negative for tremors and numbness other than noted  Psychiatric/Behavioral: Negative for decreased concentration or agitation other than above   All other system neg per pt    Objective:   Physical Exam BP 128/80   Pulse 87   Temp 98.5 F (36.9 C) (Oral)   Resp 20   Wt 189 lb (85.7 kg)   SpO2 97%   BMI 27.91 kg/m  VS noted,  Constitutional: Pt appears in no apparent distress HENT: Head: NCAT.  Right Ear: External ear normal.  Left Ear: External ear normal.  Eyes: . Pupils are equal, round, and reactive to light. Conjunctivae and EOM are normal Neck: Normal range of motion. Neck supple.  Cardiovascular: Normal rate and regular rhythm.     Pulmonary/Chest: Effort normal and breath sounds without rales or wheezing.  Abd:  Soft, NT, ND, + BS Neurological: Pt is alert. Not confused , motor grossly intact Skin: Skin is warm. No rash, no LE edema Psychiatric: Pt behavior is normal. No agitation.  No other new exam findings    Assessment & Plan:

## 2016-11-18 NOTE — Patient Instructions (Signed)
OK to decrease the amlodipine to 5 mg per day  Please continue to monitor your blood pressure with the goal top numbner in the 120 to 130 range (as long as no dizziness with this)  OK to increase the prilosec from 20 to 40 mg per day   Please continue all other medications as before, and refills have been done if requested.  Please have the pharmacy call with any other refills you may need.  Please continue your efforts at being more active, low cholesterol diet, and weight control.  You are otherwise up to date with prevention measures today.  Please keep your appointments with your specialists as you may have planned  Please go to the LAB in the Basement (turn left off the elevator) for the tests to be done today  You will be contacted by phone if any changes need to be made immediately.  Otherwise, you will receive a letter about your results with an explanation, but please check with MyChart first.  Please remember to sign up for MyChart if you have not done so, as this will be important to you in the future with finding out test results, communicating by private email, and scheduling acute appointments online when needed.  Please return in 6 months, or sooner if needed

## 2016-11-22 NOTE — Assessment & Plan Note (Signed)
Likely prostate related? For cont'd flomax, also refer urology

## 2016-11-22 NOTE — Assessment & Plan Note (Signed)
Mild uncontrolled, for incresaed prilosec from 20 to 40 mg,  to f/u any worsening symptoms or concerns

## 2016-11-22 NOTE — Assessment & Plan Note (Signed)
stable overall by history and exam, recent data reviewed with pt, and pt to continue medical treatment as before,  to f/u any worsening symptoms or concerns Lab Results  Component Value Date   HGBA1C 6.2 11/18/2016   

## 2016-11-22 NOTE — Assessment & Plan Note (Signed)
Etiology unclear, exam o/w benign,  to f/u any worsening symptoms or concerns

## 2016-11-22 NOTE — Assessment & Plan Note (Addendum)
For amlod 5 qd, o/w stable overall by history and exam, recent data reviewed with pt, and pt to continue medical treatment as before,  to f/u any worsening symptoms or concerns BP Readings from Last 3 Encounters:  11/18/16 128/80  11/12/16 118/70  10/20/16 (!) 142/72

## 2016-11-30 NOTE — Progress Notes (Signed)
[facility] [address], [place] [address] Phone: [phone] Subjective:    I'm seeing this patient by the request  of:    CC: Hip and ankle pain  [id number]  [name] is a 76 y.o. male coming in with complaint of hip and ankle pain. Past medical history for right foot and ankle pain joint arthritis of the right ankle as well as post calcaneal bursitis. Patient states some primary care provider due to worsening symptoms recently. Was given tramadol as well as Voltaren gel. Still pain and worse at night. Some  Pain mostly seems to be at night. States even with walking for significant distances patient unfortunately starts having some weakness of the leg. Seems to radiate down the right leg only. Patient states that stops him from some mild activities. Patient states though that unfortunately it seems to be increasing in frequency. Patient also states that his duration of exercise and seems less secondary to his pain. Seems to be more in the thigh than anywhere. Does radiate all the way to the ankle. Rates the severity pain is 6 out of 10.   patient did have x-rays of the ankles bilaterally. These were independently visualized by me showing moderate arthritic changes of the right ankle and mild arthritic changes of the left. Patient also had x-rays of the hips bilaterally. This was independently visualized by me showing very mild degenerative changes.  Past Medical History:  Diagnosis Date  . Abdominal pain, epigastric   . Allergic rhinitis, cause unspecified   . Arthritis   . BPH (benign prostatic hyperplasia)   . CHF (congestive heart failure) ([place])   . Chronic anticoagulation    xarelto  . Coronary artery calcification seen on CAT scan 12/26/2012  . Depressive disorder, not elsewhere classified   . Diverticulosis of colon (without mention of hemorrhage)   . Esophageal reflux   . High risk medication use   . Hyperlipidemia   . Hyperplastic  rectal polyp   . Hypertension   . Lumbago   . Pacemaker   . Paralyzed hemidiaphragm    LEFT  . Persistent atrial fibrillation ([place])   . Sick sinus syndrome [facility]) July 2010   s/p PPM [facility]) by [name]   Past Surgical History:  Procedure Laterality Date  . ESOPHAGOGASTRODUODENOSCOPY N/A 12/05/2013   Procedure: ESOPHAGOGASTRODUODENOSCOPY (EGD);  Surgeon: [name], MD;  Location: [facility];  Service: Endoscopy;  Laterality: N/A;  . ESOPHAGOGASTRODUODENOSCOPY N/A 07/04/2015   Procedure: ESOPHAGOGASTRODUODENOSCOPY (EGD);  Surgeon: [name], MD;  Location: [place];  Service: Endoscopy;  Laterality: N/A;  . INSERT / REPLACE / REMOVE PACEMAKER  05/2009    [employer] ALTRUA 60  (serial number [id number]) dual-chamber pacemaker  . PACEMAKER PLACEMENT Bilateral    '05   Social History   Social History  . Marital status: Married    Spouse name: N/A  . Number of children: N/A  . Years of education: N/A   Occupational History  . retired Retired   Social History Main Topics  . Smoking status: Former Smoker    Packs/day: 0.50    Years: 25.00    Types: Cigarettes    Quit date: 11/16/1978  . Smokeless tobacco: Former Systems developer    Types: Chew  . Alcohol use No  . Drug use: No  . Sexual activity: Not Asked   Other Topics Concern  . 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    Allergies  Allergen Reactions  . Pantoprazole Sodium Itching   Family History  Problem Relation Age of Onset  . Diabetes Mother   . Hypertension Mother   . Hypertension Father   . Lung cancer Father   . Cancer Father   . Asthma Sister   . Hypertension Sister   . Arthritis Paternal Grandmother   . Hypertension Brother   . Hypertension Daughter   . Hypertension Son     Past medical history, social, surgical and family history all reviewed in electronic medical record.  No pertanent information unless stated regarding to the chief complaint.   Review of  Systems:Review of systems updated and as accurate as of 12/01/16  No headache, visual changes, nausea, vomiting, diarrhea, constipation, dizziness, abdominal pain, skin rash, fevers, chills, night sweats, weight loss, swollen lymph nodes, body aches, joint swelling, muscle aches, chest pain, shortness of breath, mood changes.   Objective  Blood pressure 140/80, pulse 72, height 5\' 9"  (1.753 m), weight 193 lb (87.5 kg), SpO2 98 %. Systems examined below as of 12/01/16   General: No apparent distress alert and oriented x3 mood and affect normal, dressed appropriately.  HEENT: Pupils equal, extraocular movements intact  Respiratory: Patient's speak in full sentences and does not appear short of breath  Cardiovascular: No lower extremity edema, non tender, no erythema  Skin: Warm dry intact with no signs of infection or rash on extremities or on axial skeleton.  Abdomen: Soft nontender  Neuro: Cranial nerves II through XII are intact, neurovascularly intact in all extremities with 2+ DTRs and 2+ pulses.  Lymph: No lymphadenopathy of posterior or anterior cervical chain or axillae bilaterally.  Gait normal with good balance and coordination.  MSK:  Non tender with full range of motion and good stability and symmetric strength and tone of shoulders, elbows, wrist,  knee and bilaterally. Arthritic changes of multiple joints. Hip: ROM IR: 45 Deg, ER: 35 Deg mild increase in range of motion compared to contralateral side, Flexion: 120 Deg, Extension: 100 Deg, Abduction: 45 Deg, Adduction: 45 Deg Strength IR: 5/5, ER: 5/5, Flexion: 5/5, Extension: 5/5, Abduction: 5/5, Adduction: 5/5 Pelvic alignment unremarkable to inspection and palpation. Standing hip rotation and gait without trendelenburg sign / unsteadiness. Greater trochanter without tenderness to palpation. No tenderness over piriformis and greater trochanter. No pain with FABER or FADIR. Very minimal pain over the sacroiliac joint. Patient  does have some atrophy of the right side compared to the contralateral side. Positive straight leg test on the right side. Ankle: Bilateral No visible erythema or swelling. Mild arthritic changes of multiple joints. Range of motion is full in all directions. Strength is 5/5 in all directions. Stable lateral and medial ligaments; squeeze test and kleiger test unremarkable; Talar dome nontender; No pain at base of 5th MT; No tenderness over cuboid; No tenderness over N spot or navicular prominence No tenderness on posterior aspects of lateral and medial malleolus No sign of peroneal tendon subluxations or tenderness to palpation Negative tarsal tunnel tinel's Able to walk 4 steps.   Impression and Recommendations:     This case required medical decision making of moderate complexity.      Note: This dictation was prepared with Dragon dictation along with smaller phrase technology. Any transcriptional errors that result from this process are unintentional.

## 2016-12-01 ENCOUNTER — Ambulatory Visit (INDEPENDENT_AMBULATORY_CARE_PROVIDER_SITE_OTHER)
Admission: RE | Admit: 2016-12-01 | Discharge: 2016-12-01 | Disposition: A | Discharge status: Home / Self Care | Payer: Medicare Other | Source: Ambulatory Visit | Attending: Family Medicine | Admitting: Family Medicine

## 2016-12-01 ENCOUNTER — Ambulatory Visit (INDEPENDENT_AMBULATORY_CARE_PROVIDER_SITE_OTHER): Payer: Medicare Other | Admitting: Family Medicine

## 2016-12-01 ENCOUNTER — Encounter: Payer: Self-pay | Admitting: Family Medicine

## 2016-12-01 VITALS — BP 140/80 | HR 72 | Ht 69.0 in | Wt 193.0 lb

## 2016-12-01 DIAGNOSIS — M5416 Radiculopathy, lumbar region: Secondary | ICD-10-CM | POA: Insufficient documentation

## 2016-12-01 DIAGNOSIS — M545 Low back pain: Secondary | ICD-10-CM | POA: Diagnosis not present

## 2016-12-01 MED ORDER — GABAPENTIN 100 MG PO CAPS: 200.0000 mg | ORAL_CAPSULE | Freq: Every day | ORAL | 3 refills | Status: DC

## 2016-12-01 NOTE — Patient Instructions (Signed)
Good to see you.  Xray of your back downstairs.  Ice 20 minutes 2 times daily. Usually after activity and before bed. To the back Gabapentin 200mg  at night Start your exercises again regularly and get moving.  Vitamin D 2000 IU daily over the counter  If yo are going to take tylenol then take it 3 times a day regularly.  See me again in 3-4 weeks to make sure you are doing better.

## 2016-12-01 NOTE — Assessment & Plan Note (Signed)
Patient had more of a lumbar radiculopathy. We discussed icing regimen and home exercises. We discussed objective is to do in which ones to avoid. We discussed avoiding certain activities. Patient will be started on the gabapentin. X-rays are pending for further evaluation. Patient does have a pacemaker so of advance imaging is necessary with him having atrophy of the musculature and the straight leg test a CT myelogram would be needed.

## 2016-12-11 ENCOUNTER — Ambulatory Visit (INDEPENDENT_AMBULATORY_CARE_PROVIDER_SITE_OTHER): Payer: Medicare Other | Admitting: Internal Medicine

## 2016-12-11 VITALS — BP 154/90 | HR 60 | Ht 69.0 in | Wt 197.0 lb

## 2016-12-11 DIAGNOSIS — I10 Essential (primary) hypertension: Secondary | ICD-10-CM | POA: Diagnosis not present

## 2016-12-11 DIAGNOSIS — I480 Paroxysmal atrial fibrillation: Secondary | ICD-10-CM

## 2016-12-11 DIAGNOSIS — I495 Sick sinus syndrome: Secondary | ICD-10-CM | POA: Diagnosis not present

## 2016-12-11 NOTE — Progress Notes (Signed)
PCP: Cathlean Cower, MD  [name] is a 76 y.o. male who presents today for routine electrophysiology followup.  Since last being seen in our clinic, the patient reports doing reasonalbly well.   His SOB is stable, but most noticeable on steps.  [place] ocean [place].   Today, he denies symptoms of palpitations, chest pain, presyncope, or syncope.  The patient is otherwise without complaint today.   Past Medical History:  Diagnosis Date  . Abdominal pain, epigastric   . Allergic rhinitis, cause unspecified   . Arthritis   . BPH (benign prostatic hyperplasia)   . CHF (congestive heart failure) ([place])   . Chronic anticoagulation    xarelto  . Coronary artery calcification seen on CAT scan 12/26/2012  . Depressive disorder, not elsewhere classified   . Diverticulosis of colon (without mention of hemorrhage)   . Esophageal reflux   . High risk medication use   . Hyperlipidemia   . Hyperplastic rectal polyp   . Hypertension   . Lumbago   . Pacemaker   . Paralyzed hemidiaphragm    LEFT  . Persistent atrial fibrillation ([place])   . Sick sinus syndrome [facility]) July 2010   s/p PPM [facility]) by [name]   Past Surgical History:  Procedure Laterality Date  . ESOPHAGOGASTRODUODENOSCOPY N/A 12/05/2013   Procedure: ESOPHAGOGASTRODUODENOSCOPY (EGD);  Surgeon: [name], MD;  [facility]: [facility];  Service: Endoscopy;  Laterality: N/A;  . ESOPHAGOGASTRODUODENOSCOPY N/A 07/04/2015   Procedure: ESOPHAGOGASTRODUODENOSCOPY (EGD);  Surgeon: [name], MD;  Location: [place];  Service: Endoscopy;  Laterality: N/A;  . INSERT / REPLACE / REMOVE PACEMAKER  05/2009    [employer] ALTRUA 60  (serial number [id number]) dual-chamber pacemaker  . PACEMAKER PLACEMENT Bilateral    '05    Current Outpatient Prescriptions  Medication Sig Dispense Refill  . acetaminophen (TYLENOL ARTHRITIS PAIN) 650 MG CR tablet Take 650 mg by mouth every 8 (eight) hours as needed for pain.     Marland Kitchen amLODipine (NORVASC) 5 MG tablet Take 1 tablet (5 mg total) by mouth daily. 90 tablet 3  . atorvastatin (LIPITOR) 40 MG tablet TAKE 1 TABLET BY MOUTH EVERY DAY 90 tablet 3  . diclofenac sodium (VOLTAREN) 1 % GEL Apply 4 g topically 4 (four) times daily as needed. 400 g 5  . fexofenadine (ALLEGRA) 180 MG tablet TAKE 1 TABLET (180 MG TOTAL) BY MOUTH DAILY. 90 tablet 1  . flecainide (TAMBOCOR) 100 MG tablet TAKE 1 TABLET BY MOUTH TWICE A DAY 180 tablet 3  . fluticasone (FLONASE) 50 MCG/ACT nasal spray USE 2 SPRAYS IN EACH NOSTRIL EVERY DAY 16 g 5  . gabapentin (NEURONTIN) 100 MG capsule Take 2 capsules (200 mg total) by mouth at bedtime. 60 capsule 3  . latanoprost (XALATAN) 0.005 % ophthalmic solution Place 1 drop into both eyes daily.    Marland Kitchen omeprazole (PRILOSEC) 40 MG capsule Take 1 capsule (40 mg total) by mouth daily. 90 capsule 3  . oxybutynin (DITROPAN-XL) 10 MG 24 hr tablet TAKE 1 TABLET (10 MG TOTAL) BY MOUTH DAILY. 90 tablet 1  . promethazine (PHENERGAN) 12.5 MG tablet Take 1 tablet (12.5 mg total) by mouth every 6 (six) hours as needed for nausea or vomiting. 10 tablet 0  . tamsulosin (FLOMAX) 0.4 MG CAPS capsule TAKE ONE CAPSULE BY MOUTH TWICE A DAY 60 capsule 11  . traMADol (ULTRAM) 50 MG tablet Take 1 tablet (50 mg total) by mouth every 6 (  six) hours as needed. for pain 120 tablet 5  . XARELTO 20 MG TABS tablet TAKE 1 TABLET BY MOUTH EVERY DAY 90 tablet 1   No current facility-administered medications for this visit.     Physical Exam: Vitals:   12/11/16 1409  BP: (!) 154/90  Pulse: 60  Weight: 197 lb (89.4 kg)  Height: 5\' 9"  (1.753 m)    GEN- The patient is well appearing, alert and oriented x 3 today.   Head- normocephalic, atraumatic Eyes-  Sclera clear, conjunctiva pink Ears- hearing intact Oropharynx- clear Lungs- Clear to ausculation bilaterally, normal work of breathing Chest- pacemaker pocket is well healed Heart- Regular rate and rhythm, no murmurs, rubs or  gallops, PMI not laterally displaced GI- soft, NT, ND, + BS Extremities- no clubbing, cyanosis, trace edema in R leg  Pacemaker interrogation- reviewed in detail today,  See PACEART report ekg today reveals atrial pacing, RBBB, LAHB  Assessment and Plan:  1. Sick sinus syndrome/ symptomatic bradycardia Normal pacemaker function See Pace Art report Histogram is a little blunted.  MV sensor increased from 4 to 6.  Accelerometer turned on also.  2. afib Well controlled with flecainide.  Some afib episodes are due to oversensing.  Atrial sensitivity decreased from 0.25 to 0.75 Continue xarelto for stroke prevention Recent labs reviewed  3. HTN Stable No change required today  4. HL Stable No change required today Lipids and liver today  Return to the device clinic in 6 months I will see in 1 year  Thompson Grayer MD, [facility] 12/11/2016 2:31 PM

## 2016-12-11 NOTE — Patient Instructions (Addendum)
Medication Instructions:  Your physician recommends that you continue on your current medications as directed. Please refer to the Current Medication list given to you today.   Labwork: None ordered   Testing/Procedures: None ordered   Follow-Up: Your physician wants you to follow-up in: 6 months in the device clinic and 12 months with Dr Rayann Heman Dennis Bast will receive a reminder letter in the mail two months in advance. If you don't receive a letter, please call our office to schedule the follow-up appointment.     Any Other Special Instructions Will Be Listed Below (If Applicable).     If you need a refill on your cardiac medications before your next appointment, please call your pharmacy.

## 2016-12-15 LAB — CUP PACEART INCLINIC DEVICE CHECK
Brady Statistic RA Percent Paced: 77 %
Brady Statistic RV Percent Paced: 5 %
Implantable Lead Implant Date: 20100719
Implantable Lead Implant Date: 20100719
Implantable Lead Location: 753860
Implantable Lead Model: 4137
Implantable Lead Serial Number: 28454141
Lead Channel Impedance Value: 560 Ohm
Lead Channel Impedance Value: 660 Ohm
Lead Channel Pacing Threshold Amplitude: 0.6 V
Lead Channel Pacing Threshold Pulse Width: 0.4 ms
Lead Channel Sensing Intrinsic Amplitude: 1.5 mV
Lead Channel Setting Pacing Amplitude: 2.4 V
MDC IDC LEAD LOCATION: 753859
MDC IDC LEAD SERIAL: 28594836
MDC IDC MSMT LEADCHNL RV PACING THRESHOLD AMPLITUDE: 1.2 V
MDC IDC MSMT LEADCHNL RV PACING THRESHOLD PULSEWIDTH: 1 ms
MDC IDC MSMT LEADCHNL RV SENSING INTR AMPL: 10.4 mV
MDC IDC PG IMPLANT DT: 20100719
MDC IDC SESS DTM: 20180126050000
MDC IDC SET LEADCHNL RA PACING AMPLITUDE: 2 V
MDC IDC SET LEADCHNL RV PACING PULSEWIDTH: 1 ms
MDC IDC SET LEADCHNL RV SENSING SENSITIVITY: 2.5 mV
Pulse Gen Serial Number: 764370

## 2016-12-18 ENCOUNTER — Telehealth: Payer: Self-pay | Admitting: Internal Medicine

## 2016-12-18 NOTE — Telephone Encounter (Signed)
[name] is calling because he state that his pacemaker was turned up and he needs it to be turn back down , its making his blood pressure go up . Please call

## 2016-12-18 NOTE — Telephone Encounter (Signed)
LMTCB//sss 

## 2016-12-18 NOTE — Telephone Encounter (Signed)
Spoke to patient regarding his recent Bp reading. Patient states that he checked his BP the other morning and and had a systolic reading in the [id number]. Patient states that he checked his BP about an hour later, after medications, and it had gone back down to the 120s. I spoke to patient regarding the change that was made to his device and assured him that the change should not have affected his Bp in that way. I encouraged patient to keep a record of his Bp readings and call back if he notices that it goes up and stays up. I also encouraged patient to call if he has any associated sx's. Patient voiced understanding and appreciation of information.

## 2016-12-22 ENCOUNTER — Other Ambulatory Visit: Payer: Self-pay | Admitting: Internal Medicine

## 2016-12-29 ENCOUNTER — Other Ambulatory Visit: Payer: Self-pay | Admitting: Internal Medicine

## 2016-12-29 DIAGNOSIS — N3281 Overactive bladder: Secondary | ICD-10-CM | POA: Diagnosis not present

## 2016-12-29 DIAGNOSIS — R351 Nocturia: Secondary | ICD-10-CM | POA: Diagnosis not present

## 2016-12-29 DIAGNOSIS — N529 Male erectile dysfunction, unspecified: Secondary | ICD-10-CM | POA: Diagnosis not present

## 2016-12-31 ENCOUNTER — Telehealth: Payer: Self-pay | Admitting: Internal Medicine

## 2016-12-31 NOTE — Telephone Encounter (Signed)
New Message      Jamie with [facility] does stimulation for the bladder for this pt and because he has a pacemaker she would like to know it it can be turned off with the magnet. Would like a call back.

## 2017-01-01 NOTE — Telephone Encounter (Signed)
Left voicemail with Roselyn Reef at [facility].

## 2017-01-03 ENCOUNTER — Other Ambulatory Visit: Payer: Self-pay | Admitting: Internal Medicine

## 2017-01-11 ENCOUNTER — Ambulatory Visit: Payer: Medicare Other | Admitting: Family Medicine

## 2017-01-12 ENCOUNTER — Telehealth: Payer: Self-pay | Admitting: Internal Medicine

## 2017-01-12 NOTE — Telephone Encounter (Signed)
Follow Up   Roselyn Reef from [facility] calling back to follow up. Per Roselyn Reef she never received voicemail from nurse, and is requesting a call back regarding turning the patient's pacemaker off while he is doing treatments.

## 2017-01-12 NOTE — Telephone Encounter (Signed)
LVM for [name] to call device clinic.

## 2017-01-14 ENCOUNTER — Telehealth: Payer: Self-pay | Admitting: Internal Medicine

## 2017-01-14 NOTE — Telephone Encounter (Signed)
New message    Request for surgical clearance:  1. What type of surgery is being performed? cyto and botox injection   2. When is this surgery scheduled? It is not yet.   3. Are there any medications that need to be held prior to surgery and how long? Xarelto. How long can it be held before and after?  4. Name of physician performing surgery? Dr. Karsten Ro  5. What is your office phone and fax number? [phone]

## 2017-01-15 NOTE — Telephone Encounter (Signed)
Pt on Xarelto for afib with CHADS<4 and no history of stroke. Ok to hold 24 hours prior to procedure per protocol.  

## 2017-01-15 NOTE — Telephone Encounter (Signed)
Spoke with Jeannene Patella and she is aware

## 2017-01-20 ENCOUNTER — Other Ambulatory Visit: Payer: Self-pay | Admitting: Internal Medicine

## 2017-03-25 ENCOUNTER — Other Ambulatory Visit: Payer: Self-pay | Admitting: Internal Medicine

## 2017-04-20 ENCOUNTER — Ambulatory Visit (INDEPENDENT_AMBULATORY_CARE_PROVIDER_SITE_OTHER): Payer: Medicare Other | Admitting: Internal Medicine

## 2017-04-20 ENCOUNTER — Encounter: Payer: Self-pay | Admitting: Internal Medicine

## 2017-04-20 ENCOUNTER — Other Ambulatory Visit (INDEPENDENT_AMBULATORY_CARE_PROVIDER_SITE_OTHER): Payer: Medicare Other

## 2017-04-20 ENCOUNTER — Other Ambulatory Visit: Payer: Self-pay

## 2017-04-20 VITALS — BP 148/98 | HR 98 | Ht 69.0 in | Wt 189.0 lb

## 2017-04-20 DIAGNOSIS — I10 Essential (primary) hypertension: Secondary | ICD-10-CM | POA: Diagnosis not present

## 2017-04-20 DIAGNOSIS — R972 Elevated prostate specific antigen [PSA]: Secondary | ICD-10-CM | POA: Diagnosis not present

## 2017-04-20 DIAGNOSIS — E785 Hyperlipidemia, unspecified: Secondary | ICD-10-CM | POA: Diagnosis not present

## 2017-04-20 DIAGNOSIS — R739 Hyperglycemia, unspecified: Secondary | ICD-10-CM

## 2017-04-20 LAB — PSA: PSA: 1.91 ng/mL (ref 0.10–4.00)

## 2017-04-20 MED ORDER — LOSARTAN POTASSIUM 25 MG PO TABS: 25.0000 mg | ORAL_TABLET | Freq: Every day | ORAL | 3 refills | Status: DC

## 2017-04-20 MED ORDER — ATORVASTATIN CALCIUM 80 MG PO TABS: 80.0000 mg | ORAL_TABLET | Freq: Every day | ORAL | 3 refills | Status: DC

## 2017-04-20 MED ORDER — TRAMADOL HCL 50 MG PO TABS: 50.0000 mg | ORAL_TABLET | Freq: Four times a day (QID) | ORAL | 5 refills | Status: DC | PRN

## 2017-04-20 NOTE — Telephone Encounter (Signed)
Done hardcopy to Shirron  

## 2017-04-20 NOTE — Progress Notes (Signed)
Subjective:    Patient ID: [name], male    DOB: 11-Sep-1941, 76 y.o.   MRN: [id number]  HPI  Here to f/u; overall doing ok,  Pt denies chest pain, increasing sob or doe, wheezing, orthopnea, PND, increased LE swelling, palpitations, dizziness or syncope.  Pt denies new neurological symptoms such as new headache, or facial or extremity weakness or numbness.  Pt denies polydipsia, polyuria, or low sugar episode.   Pt denies new neurological symptoms such as new headache, or facial or extremity weakness or numbness.   Pt states overall good compliance with meds, mostly trying to follow appropriate diet, with wt overall down several labs.   BP tend to be high in the AM, but lower in the afternoon, and just had amlod 10 mg reduced to 5 mg in dec 2017 for low blood pressure and dizziness. BP Readings from Last 3 Encounters:  04/20/17 (!) 148/98  12/11/16 (!) 154/90  12/01/16 140/80   Wt Readings from Last 3 Encounters:  04/20/17 189 lb (85.7 kg)  12/11/16 197 lb (89.4 kg)  12/01/16 193 lb (87.5 kg)  Not taking the flonase as did not seem to help.  Also stopped the gabapentin as even the 200 mg qhs was overly sedating and he is doing ok with tylenol and volt gel anyway.  PSA was slightly increased over baseline last visit - Denies urinary symptoms such as dysuria, frequency, urgency, flank pain, hematuria or n/v, fever, chills. Past Medical History:  Diagnosis Date  . Abdominal pain, epigastric   . Allergic rhinitis, cause unspecified   . Arthritis   . BPH (benign prostatic hyperplasia)   . CHF (congestive heart failure) ([place])   . Chronic anticoagulation    xarelto  . Coronary artery calcification seen on CAT scan 12/26/2012  . Depressive disorder, not elsewhere classified   . Diverticulosis of colon (without mention of hemorrhage)   . Esophageal reflux   . High risk medication use   . Hyperlipidemia   . Hyperplastic rectal polyp   . Hypertension   . Lumbago   . Pacemaker   . Paralyzed  hemidiaphragm    LEFT  . Persistent atrial fibrillation ([place])   . Sick sinus syndrome [facility]) July 2010   s/p PPM [facility]) by [name]   Past Surgical History:  Procedure Laterality Date  . ESOPHAGOGASTRODUODENOSCOPY N/A 12/05/2013   Procedure: ESOPHAGOGASTRODUODENOSCOPY (EGD);  Surgeon: [name], MD;  Location: [facility];  Service: Endoscopy;  Laterality: N/A;  . ESOPHAGOGASTRODUODENOSCOPY N/A 07/04/2015   Procedure: ESOPHAGOGASTRODUODENOSCOPY (EGD);  Surgeon: [name], MD;  Location: [facility];  Service: Endoscopy;  Laterality: N/A;  . INSERT / REPLACE / REMOVE PACEMAKER  05/2009    [employer] ALTRUA 60  (serial number [id number]) dual-chamber pacemaker  . PACEMAKER PLACEMENT Bilateral    '05    reports that he quit smoking about 38 years ago. His smoking use included Cigarettes. He has a 12.50 pack-year smoking history. He has quit using smokeless tobacco. His smokeless tobacco use included Chew. He reports that he does not drink alcohol or use drugs. family history includes Arthritis in his paternal grandmother; Asthma in his sister; Cancer in his father; Diabetes in his mother; Hypertension in his brother, daughter, father, mother, sister, and son; Lung cancer in his father. Allergies  Allergen Reactions  . Pantoprazole Sodium Itching   Current Outpatient Prescriptions on File Prior to Visit  Medication Sig Dispense Refill  . acetaminophen (TYLENOL ARTHRITIS PAIN) 650 MG  CR tablet Take 650 mg by mouth every 8 (eight) hours as needed for pain.    Marland Kitchen amLODipine (NORVASC) 5 MG tablet Take 1 tablet (5 mg total) by mouth daily. 90 tablet 3  . diclofenac sodium (VOLTAREN) 1 % GEL Apply 4 g topically 4 (four) times daily as needed. 400 g 5  . fexofenadine (ALLEGRA) 180 MG tablet TAKE 1 TABLET (180 MG TOTAL) BY MOUTH DAILY. 90 tablet 1  . flecainide (TAMBOCOR) 100 MG tablet TAKE 1 TABLET BY MOUTH TWICE A DAY 180 tablet 3  . latanoprost (XALATAN) 0.005 % ophthalmic  solution Place 1 drop into both eyes daily.    Marland Kitchen omeprazole (PRILOSEC) 40 MG capsule Take 1 capsule (40 mg total) by mouth daily. 90 capsule 3  . oxybutynin (DITROPAN-XL) 10 MG 24 hr tablet TAKE 1 TABLET (10 MG TOTAL) BY MOUTH DAILY. 90 tablet 1  . promethazine (PHENERGAN) 12.5 MG tablet Take 1 tablet (12.5 mg total) by mouth every 6 (six) hours as needed for nausea or vomiting. 10 tablet 0  . tamsulosin (FLOMAX) 0.4 MG CAPS capsule TAKE ONE CAPSULE BY MOUTH TWICE A DAY 60 capsule 11  . traMADol (ULTRAM) 50 MG tablet Take 1 tablet (50 mg total) by mouth every 6 (six) hours as needed. for pain 120 tablet 5  . XARELTO 20 MG TABS tablet TAKE 1 TABLET BY MOUTH EVERY DAY 90 tablet 1  . [DISCONTINUED] furosemide (LASIX) 40 MG tablet Take 1 tablet (40 mg total) by mouth daily. 30 tablet 11   No current facility-administered medications on file prior to visit.    Review of Systems  Constitutional: Negative for other unusual diaphoresis or sweats HENT: Negative for ear discharge or swelling Eyes: Negative for other worsening visual disturbances Respiratory: Negative for stridor or other swelling  Gastrointestinal: Negative for worsening distension or other blood Genitourinary: Negative for retention or other urinary change Musculoskeletal: Negative for other MSK pain or swelling Skin: Negative for color change or other new lesions Neurological: Negative for worsening tremors and other numbness  Psychiatric/Behavioral: Negative for worsening agitation or other fatigue All other system neg per pt    Objective:   Physical Exam BP (!) 148/98   Pulse 98   Ht 5\' 9"  (1.753 m)   Wt 189 lb (85.7 kg)   SpO2 100%   BMI 27.91 kg/m  VS noted, not ill appearing Constitutional: Pt appears in NAD HENT: Head: NCAT.  Right Ear: External ear normal.  Left Ear: External ear normal.  Eyes: . Pupils are equal, round, and reactive to light. Conjunctivae and EOM are normal Nose: without d/c or deformity Neck:  Neck supple. Gross normal ROM Cardiovascular: Normal rate and regular rhythm.  (not irregular) Pulmonary/Chest: Effort normal and breath sounds without rales or wheezing.  Neurological: Pt is alert. At baseline orientation, motor grossly intact Skin: Skin is warm. No rashes, other new lesions, no LE edema Psychiatric: Pt behavior is normal without agitation  No other exam findings    Assessment & Plan:

## 2017-04-20 NOTE — Assessment & Plan Note (Signed)
Mild, for f/u PSA today, o/w asympt, cont same tx

## 2017-04-20 NOTE — Assessment & Plan Note (Signed)
Lab Results  Component Value Date   HGBA1C 6.2 11/18/2016  stable overall by history and exam, recent data reviewed with pt, and pt to continue medical treatment as before,  to f/u any worsening symptoms or concerns

## 2017-04-20 NOTE — Assessment & Plan Note (Signed)
Mild uncontrolled, to add losartan 25 qd

## 2017-04-20 NOTE — Telephone Encounter (Signed)
Pt called and stated that he forgot to ask for his refill on Tramadol. Please advise.

## 2017-04-20 NOTE — Patient Instructions (Addendum)
Please take all new medication as prescribed - the losartan 25 mg for blood pressure, and increase liptior to 80 mg per day  OK to stop the flonase and the gabapentin as you have  Please continue all other medications as before, and refills have been done if requested.  Please have the pharmacy call with any other refills you may need.  Please keep your appointments with your specialists as you may have planned  Please go to the LAB in the Basement (turn left off the elevator) for the tests to be done today - just the PSA today  You will be contacted by phone if any changes need to be made immediately.  Otherwise, you will receive a letter about your results with an explanation, but please check with MyChart first.  Please return in 6 months, or sooner if needed

## 2017-04-20 NOTE — Telephone Encounter (Signed)
faxed

## 2017-04-20 NOTE — Assessment & Plan Note (Signed)
Mild uncontrolled, toal < 70, for incresaed lipitor 80 qd,  to f/u any worsening symptoms or concerns

## 2017-05-03 ENCOUNTER — Emergency Department (HOSPITAL_BASED_OUTPATIENT_CLINIC_OR_DEPARTMENT_OTHER)
Admission: EM | Admit: 2017-05-03 | Discharge: 2017-05-03 | Disposition: A | Discharge status: Home / Self Care | Payer: Medicare Other | Attending: Emergency Medicine | Admitting: Emergency Medicine

## 2017-05-03 ENCOUNTER — Encounter (HOSPITAL_BASED_OUTPATIENT_CLINIC_OR_DEPARTMENT_OTHER): Payer: Self-pay | Admitting: *Deleted

## 2017-05-03 ENCOUNTER — Emergency Department (HOSPITAL_BASED_OUTPATIENT_CLINIC_OR_DEPARTMENT_OTHER): Payer: Medicare Other

## 2017-05-03 DIAGNOSIS — I110 Hypertensive heart disease with heart failure: Secondary | ICD-10-CM | POA: Diagnosis not present

## 2017-05-03 DIAGNOSIS — Z45018 Encounter for adjustment and management of other part of cardiac pacemaker: Secondary | ICD-10-CM | POA: Diagnosis not present

## 2017-05-03 DIAGNOSIS — R109 Unspecified abdominal pain: Secondary | ICD-10-CM | POA: Insufficient documentation

## 2017-05-03 DIAGNOSIS — I509 Heart failure, unspecified: Secondary | ICD-10-CM | POA: Insufficient documentation

## 2017-05-03 DIAGNOSIS — Z7901 Long term (current) use of anticoagulants: Secondary | ICD-10-CM | POA: Diagnosis not present

## 2017-05-03 DIAGNOSIS — Z79899 Other long term (current) drug therapy: Secondary | ICD-10-CM | POA: Insufficient documentation

## 2017-05-03 DIAGNOSIS — Z87891 Personal history of nicotine dependence: Secondary | ICD-10-CM | POA: Diagnosis not present

## 2017-05-03 DIAGNOSIS — R197 Diarrhea, unspecified: Secondary | ICD-10-CM | POA: Diagnosis not present

## 2017-05-03 DIAGNOSIS — R112 Nausea with vomiting, unspecified: Secondary | ICD-10-CM | POA: Insufficient documentation

## 2017-05-03 DIAGNOSIS — K5730 Diverticulosis of large intestine without perforation or abscess without bleeding: Secondary | ICD-10-CM | POA: Diagnosis not present

## 2017-05-03 DIAGNOSIS — R1110 Vomiting, unspecified: Secondary | ICD-10-CM | POA: Diagnosis not present

## 2017-05-03 LAB — CBC WITH DIFFERENTIAL/PLATELET
BASOS PCT: 0 %
Basophils Absolute: 0 10*3/uL (ref 0.0–0.1)
EOS PCT: 0 %
Eosinophils Absolute: 0 10*3/uL (ref 0.0–0.7)
HEMATOCRIT: 42.7 % (ref 39.0–52.0)
Hemoglobin: 15 g/dL (ref 13.0–17.0)
Lymphocytes Relative: 7 %
Lymphs Abs: 0.6 10*3/uL — ABNORMAL LOW (ref 0.7–4.0)
MCH: 31.1 pg (ref 26.0–34.0)
MCHC: 35.1 g/dL (ref 30.0–36.0)
MCV: 88.4 fL (ref 78.0–100.0)
MONO ABS: 0.3 10*3/uL (ref 0.1–1.0)
MONOS PCT: 3 %
NEUTROS ABS: 7.7 10*3/uL (ref 1.7–7.7)
Neutrophils Relative %: 90 %
Platelets: 186 10*3/uL (ref 150–400)
RBC: 4.83 MIL/uL (ref 4.22–5.81)
RDW: 13.4 % (ref 11.5–15.5)
WBC: 8.6 10*3/uL (ref 4.0–10.5)

## 2017-05-03 LAB — TROPONIN I

## 2017-05-03 LAB — COMPREHENSIVE METABOLIC PANEL
ALBUMIN: 4.5 g/dL (ref 3.5–5.0)
ALT: 26 U/L (ref 17–63)
ANION GAP: 12 (ref 5–15)
AST: 34 U/L (ref 15–41)
Alkaline Phosphatase: 66 U/L (ref 38–126)
BILIRUBIN TOTAL: 0.8 mg/dL (ref 0.3–1.2)
BUN: 13 mg/dL (ref 6–20)
CO2: 21 mmol/L — ABNORMAL LOW (ref 22–32)
Calcium: 9.3 mg/dL (ref 8.9–10.3)
Chloride: 107 mmol/L (ref 101–111)
Creatinine, Ser: 0.68 mg/dL (ref 0.61–1.24)
GFR calc Af Amer: >60 mL/min (ref >60)
GLUCOSE: 146 mg/dL — AB (ref 65–99)
POTASSIUM: 3.7 mmol/L (ref 3.5–5.1)
Sodium: 140 mmol/L (ref 135–145)
TOTAL PROTEIN: 7.7 g/dL (ref 6.5–8.1)

## 2017-05-03 LAB — URINALYSIS, ROUTINE W REFLEX MICROSCOPIC
Bilirubin Urine: NEGATIVE
Glucose, UA: NEGATIVE mg/dL
KETONES UR: NEGATIVE mg/dL
LEUKOCYTES UA: NEGATIVE
NITRITE: NEGATIVE
PROTEIN: NEGATIVE mg/dL
Specific Gravity, Urine: 1.02 (ref 1.005–1.030)
pH: 7 (ref 5.0–8.0)

## 2017-05-03 LAB — URINALYSIS, MICROSCOPIC (REFLEX)

## 2017-05-03 LAB — LIPASE, BLOOD: LIPASE: 30 U/L (ref 11–51)

## 2017-05-03 LAB — OCCULT BLOOD X 1 CARD TO LAB, STOOL: Fecal Occult Bld: NEGATIVE

## 2017-05-03 MED ORDER — MORPHINE SULFATE (PF) 4 MG/ML IV SOLN
4.0000 mg | Freq: Once | INTRAVENOUS | Status: AC
  Administered 2017-05-03: 4 mg via INTRAVENOUS
  Filled 2017-05-03: qty 1

## 2017-05-03 MED ORDER — ONDANSETRON 4 MG PO TBDP: 4.0000 mg | ORAL_TABLET | Freq: Three times a day (TID) | ORAL | 0 refills | Status: DC | PRN

## 2017-05-03 MED ORDER — SODIUM CHLORIDE 0.9 % IV BOLUS (SEPSIS)
1000.0000 mL | Freq: Once | INTRAVENOUS | Status: AC
  Administered 2017-05-03: 1000 mL via INTRAVENOUS

## 2017-05-03 MED ORDER — ONDANSETRON HCL 4 MG/2ML IJ SOLN: 4.0000 mg | Freq: Once | INTRAMUSCULAR | Status: DC

## 2017-05-03 MED ORDER — IOPAMIDOL (ISOVUE-300) INJECTION 61%
100.0000 mL | Freq: Once | INTRAVENOUS | Status: AC | PRN
  Administered 2017-05-03: 100 mL via INTRAVENOUS

## 2017-05-03 MED ORDER — ACETAMINOPHEN 325 MG PO TABS
650.0000 mg | ORAL_TABLET | Freq: Once | ORAL | Status: AC
  Administered 2017-05-03: 650 mg via ORAL
  Filled 2017-05-03: qty 2

## 2017-05-03 MED ORDER — GI COCKTAIL ~~LOC~~
30.0000 mL | Freq: Once | ORAL | Status: AC
  Administered 2017-05-03: 30 mL via ORAL
  Filled 2017-05-03: qty 30

## 2017-05-03 MED ORDER — ONDANSETRON HCL 4 MG/2ML IJ SOLN
4.0000 mg | Freq: Once | INTRAMUSCULAR | Status: AC
  Administered 2017-05-03: 4 mg via INTRAVENOUS
  Filled 2017-05-03: qty 2

## 2017-05-03 NOTE — ED Provider Notes (Signed)
Medical screening examination/treatment/procedure(s) were conducted as a shared visit with non-physician practitioner(s) and myself.  I personally evaluated the patient during the encounter.   EKG Interpretation  Date/Time:  Monday May 03 2017 18:14:52 EDT Ventricular Rate:  77 PR Interval:    QRS Duration: 107 QT Interval:  496 QTC Calculation: 562 R Axis:   -3 Text Interpretation:  Sinus rhythm Abnormal T, consider ischemia, diffuse leads Prolonged QT interval Confirmed by Fredia Sorrow [phone]) on 05/03/2017 6:19:57 PM       Results for orders placed or performed during the hospital encounter of 05/03/17  CBC with Differential  Result Value Ref Range   WBC 8.6 4.0 - 10.5 K/uL   RBC 4.83 4.22 - 5.81 MIL/uL   Hemoglobin 15.0 13.0 - 17.0 g/dL   HCT 42.7 39.0 - 52.0 %   MCV 88.4 78.0 - 100.0 fL   MCH 31.1 26.0 - 34.0 pg   MCHC 35.1 30.0 - 36.0 g/dL   RDW 13.4 11.5 - 15.5 %   Platelets 186 150 - 400 K/uL   Neutrophils Relative % 90 %   Neutro Abs 7.7 1.7 - 7.7 K/uL   Lymphocytes Relative 7 %   Lymphs Abs 0.6 (L) 0.7 - 4.0 K/uL   Monocytes Relative 3 %   Monocytes Absolute 0.3 0.1 - 1.0 K/uL   Eosinophils Relative 0 %   Eosinophils Absolute 0.0 0.0 - 0.7 K/uL   Basophils Relative 0 %   Basophils Absolute 0.0 0.0 - 0.1 K/uL  Comprehensive metabolic panel  Result Value Ref Range   Sodium 140 135 - 145 mmol/L   Potassium 3.7 3.5 - 5.1 mmol/L   Chloride 107 101 - 111 mmol/L   CO2 21 (L) 22 - 32 mmol/L   Glucose, Bld 146 (H) 65 - 99 mg/dL   BUN 13 6 - 20 mg/dL   Creatinine, Ser 0.68 0.61 - 1.24 mg/dL   Calcium 9.3 8.9 - 10.3 mg/dL   Total Protein 7.7 6.5 - 8.1 g/dL   Albumin 4.5 3.5 - 5.0 g/dL   AST 34 15 - 41 U/L   ALT 26 17 - 63 U/L   Alkaline Phosphatase 66 38 - 126 U/L   Total Bilirubin 0.8 0.3 - 1.2 mg/dL   GFR calc non Af Amer >60 >60 mL/min   GFR calc Af Amer >60 >60 mL/min   Anion gap 12 5 - 15  Lipase, blood  Result Value Ref Range   Lipase 30 11 - 51 U/L   Urinalysis, Routine w reflex microscopic  Result Value Ref Range   Color, Urine YELLOW YELLOW   APPearance CLEAR CLEAR   Specific Gravity, Urine 1.020 1.005 - 1.030   pH 7.0 5.0 - 8.0   Glucose, UA NEGATIVE NEGATIVE mg/dL   Hgb urine dipstick LARGE (A) NEGATIVE   Bilirubin Urine NEGATIVE NEGATIVE   Ketones, ur NEGATIVE NEGATIVE mg/dL   Protein, ur NEGATIVE NEGATIVE mg/dL   Nitrite NEGATIVE NEGATIVE   Leukocytes, UA NEGATIVE NEGATIVE  Troponin I  Result Value Ref Range   Troponin I <0.03 <0.03 ng/mL  Urinalysis, Microscopic (reflex)  Result Value Ref Range   RBC / HPF TOO NUMEROUS TO COUNT 0 - 5 RBC/hpf   WBC, UA 0-5 0 - 5 WBC/hpf   Bacteria, UA FEW (A) NONE SEEN   Squamous Epithelial / LPF 0-5 (A) NONE SEEN   Mucous PRESENT   Occult blood card to lab, stool RN will collect  Result Value Ref Range  Fecal Occult Bld NEGATIVE NEGATIVE   Ct Abdomen Pelvis W Contrast  Result Date: 05/03/2017 CLINICAL DATA:  Generalized abdominal pain for 1 day. Vomiting. Nausea. EXAM: CT ABDOMEN AND PELVIS WITH CONTRAST TECHNIQUE: Multidetector CT imaging of the abdomen and pelvis was performed using the standard protocol following bolus administration of intravenous contrast. CONTRAST:  179mL ISOVUE-300 IOPAMIDOL (ISOVUE-300) INJECTION 61% COMPARISON:  CT 07/01/2015 FINDINGS: Lower chest: Pacemaker is partially included. Mild cardiomegaly. Atelectasis versus scarring at the left lung base with elevated left hemidiaphragm. Hepatobiliary: Scattered calcified granulomas. No focal hepatic lesion. No gallstones, gallbladder wall thickening, or biliary dilatation. Pancreas: Parenchymal atrophy. No ductal dilatation or inflammation. A few tiny calcifications about pancreatic tail, unchanged from prior exam. Spleen: Normal in size without focal abnormality. Adrenals/Urinary Tract: No adrenal nodule. No hydronephrosis. Nonobstructing stones in the left kidney. Mild nonspecific perinephric edema, right greater than  left. Homogeneous enhancement with symmetric renal excretion on delayed phase imaging. Urinary bladder is physiologically distended. Mild thickening of the adrenal glands without discrete nodule. Stomach/Bowel: Small hiatal hernia. Stomach distended with enteric contrast, no gastric wall thickening. No small bowel dilatation or inflammation. No bowel obstruction. Multifocal colonic diverticulosis, prominent in the descending and sigmoid colon. No discrete pericolonic or para diverticular inflammatory change. Normal appendix. Vascular/Lymphatic: Aortic and branch atherosclerosis without aneurysm. Retroperitoneal calcifications in the left upper quadrant and adjacent to the third portion of the duodenum are stable from prior exam, likely sequela of prior granulomatous disease. No noncalcified adenopathy. Reproductive: Stable enlarged prostate gland spanning 6.5 cm. Other: No free air, free fluid, or intra-abdominal fluid collection. Fat within the inguinal canals, right greater than left. Musculoskeletal: Stable degenerative change in the spine. There are no acute or suspicious osseous abnormalities. IMPRESSION: 1. No acute abnormality or explanation for abdominal pain. 2. Colonic diverticulosis without diverticulitis. 3. Abdominal aortic atherosclerosis, no aneurysm. 4. Nonobstructing left renal calculi. Electronically Signed   By: Jeb Levering M.D.   On: 05/03/2017 21:57    Patient seen by me along with the physician assistant. Patient came in for complaint of nausea vomiting and diarrhea that started at 6 this morning. After eating and neck during. Workup CT scan negative for any acute abdominal findings. May be a viral gastroenteritis or could be related to the nectarine. Patient has a pacemaker defibrillator made by [employer]. We had a rhythm here that was suggestive of either pacemaker malfunction or maybe V. tach. Patient was completely asymptomatic. Had it interrogated shows no evidence of any  abnormalities. Normal pacemaker function. The rhythm that we saw was shared with the interrogator and they felt things were fine. Patient completely asymptomatic patient feels better now. Patient stable for discharge home and follow-up with his doctors. Patient will return for any new or worse symptoms. Patient did not feel as if heart was going fast.   Patients abdomen soft nontender lungs clear. Heart regular no tachycardia.   Fredia Sorrow, MD 05/03/17 2325

## 2017-05-03 NOTE — ED Notes (Signed)
Patient having abnormal Rhythm on monitor while goes from sitting to lying down. Attempted EKG and unable. Patient denies any Chest pain, SOB or any distress. No distress noted, pulse strong. MD aware of the changes. Patient unable to report what type of pacemaker that he had at this time. Orders to follow

## 2017-05-03 NOTE — ED Triage Notes (Signed)
Pt reports n/v/d since 0600 this morning. C/o abdominal pain which started after he began vomiting. Pt is alert and oriented

## 2017-05-03 NOTE — ED Provider Notes (Signed)
[place] DEPT [facility] Provider Note   CSN: [id number] Arrival date & time: 05/03/17  1533  By signing my name below, I, Hansel Feinstein, attest that this documentation has been prepared under the direction and in the presence of  Carmon Sails, PA-C. Electronically Signed: Hansel Feinstein, ED Scribe. 05/03/17. 4:47 PM.    History   Chief Complaint Chief Complaint  Patient presents with  . Emesis    HPI [name] is a 76 y.o. male with h/o diverticulosis, left-sided paralyzed hemidiaphragm,and GERD who presents to the Emergency Department complaining of intermittent NBNB emesis that began at 6:15 this morning with associated generalized abdominal pain, nausea and NB diarrhea. Of note, pt states he ate a nectarine before the onset of his symptoms. He reports <10 episodes of diarrhea and >10 episodes of emesis. Pt also reports SOB, which he attributes to his paralyzed hemidiaphragm. No recent suspicious food intake, sick contact at home with similar symptoms, recent travel,  recent falls or head injuries, medication changes. No h/o abdominal surgeries. He denies hematemesis, melena or hematochezia, CP, dysuria, difficulty urinating, hematuria, frequency, urgency, HA, vision changes.     The history is provided by the patient. No language interpreter was used.    Past Medical History:  Diagnosis Date  . Abdominal pain, epigastric   . Allergic rhinitis, cause unspecified   . Arthritis   . BPH (benign prostatic hyperplasia)   . CHF (congestive heart failure) ([place])   . Chronic anticoagulation    xarelto  . Coronary artery calcification seen on CAT scan 12/26/2012  . Depressive disorder, not elsewhere classified   . Diverticulosis of colon (without mention of hemorrhage)   . Esophageal reflux   . High risk medication use   . Hyperlipidemia   . Hyperplastic rectal polyp   . Hypertension   . Lumbago   . Pacemaker   . Paralyzed hemidiaphragm    LEFT  . Persistent atrial fibrillation  ([place])   . Sick sinus syndrome [facility]) July 2010   s/p PPM [facility]) by [facility]    Patient Active Problem List   Diagnosis Date Noted  . Lumbar radiculopathy 12/01/2016  . Dizziness 11/18/2016  . Nocturia 11/18/2016  . Hyperglycemia 11/18/2016  . Bilateral hip pain 10/20/2016  . Bilateral ankle pain 10/20/2016  . Bilateral hand pain 10/20/2016  . Fatigue 04/28/2016  . Acute sinus infection 01/28/2016  . Cough 01/28/2016  . Chronic pain 07/11/2015  . Alcohol abuse 07/01/2015  . Increased prostate specific antigen (PSA) velocity 06/26/2014  . Neck mass 12/26/2013  . Arthritis of ankle or foot, degenerative 12/16/2013  . Retrocalcaneal bursitis 12/16/2013  . Abdominal pain 11/14/2013  . Near syncope 05/06/2013  . abnormal (EKG) 05/06/2013  . Dry mouth 03/02/2013  . Coronary artery calcification seen on CAT scan 12/26/2012  . Lumbar back pain 12/26/2012  . 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  . Leg pain, right 04/17/2012  . Leg swelling 04/17/2012  . DOE (dyspnea on exertion) 02/26/2012  . Preventative health care 12/24/2011  . Cervical radiculopathy 12/24/2011  . Shortness of breath 12/18/2010  . SYNCOPE 12/04/2010  . FREQUENCY, URINARY 11/24/2010  . NECK PAIN, RIGHT 08/21/2010  . Atrial fibrillation ([place]) 05/29/2010  . Sick sinus syndrome ([place]) 05/20/2010  . NAUSEA AND VOMITING 05/20/2010  . Rhabdomyolysis 03/25/2010  . SHOULDER PAIN, RIGHT 12/09/2009  . ACHILLES TENDINITIS 12/09/2009  . BPH (benign prostatic hyperplasia) 06/26/2009  . WEIGHT LOSS-ABNORMAL 06/05/2009  . NAUSEA 06/05/2009  . PERSONAL HX COLONIC POLYPS  06/05/2009  . BRADYCARDIA 05/27/2009  . FATIGUE 05/27/2009  . Hyperlipidemia 05/30/2008  . Depression 05/30/2008  . Essential hypertension 05/30/2008  . ALLERGIC RHINITIS 05/30/2008  . GERD 05/30/2008  . Diverticulosis of colon (without mention of hemorrhage) 05/30/2008  . OVERACTIVE BLADDER 05/30/2008  . LOW BACK PAIN 05/30/2008    Past Surgical  History:  Procedure Laterality Date  . ESOPHAGOGASTRODUODENOSCOPY N/A 12/05/2013   Procedure: ESOPHAGOGASTRODUODENOSCOPY (EGD);  Surgeon: [name], MD;  Location: [facility];  Service: Endoscopy;  Laterality: N/A;  . ESOPHAGOGASTRODUODENOSCOPY N/A 07/04/2015   Procedure: ESOPHAGOGASTRODUODENOSCOPY (EGD);  Surgeon: [name], MD;  Location: [place];  Service: Endoscopy;  Laterality: N/A;  . INSERT / REPLACE / REMOVE PACEMAKER  05/2009    [employer] ALTRUA 60  (serial number [id number]) dual-chamber pacemaker  . PACEMAKER PLACEMENT Bilateral    '05       Home Medications    Prior to Admission medications   Medication Sig Start Date End Date Taking? Authorizing Provider  amLODipine (NORVASC) 5 MG tablet Take 1 tablet (5 mg total) by mouth daily. 11/18/16 11/18/17 Yes Biagio Borg, MD  atorvastatin (LIPITOR) 80 MG tablet Take 1 tablet (80 mg total) by mouth daily. 04/20/17  Yes Biagio Borg, MD  diclofenac sodium (VOLTAREN) 1 % GEL Apply 4 g topically 4 (four) times daily as needed. 10/20/16  Yes Biagio Borg, MD  fexofenadine (ALLEGRA) 180 MG tablet TAKE 1 TABLET (180 MG TOTAL) BY MOUTH DAILY. 12/10/15  Yes Biagio Borg, MD  flecainide (TAMBOCOR) 100 MG tablet TAKE 1 TABLET BY MOUTH TWICE A DAY 12/24/16  Yes Biagio Borg, MD  latanoprost (XALATAN) 0.005 % ophthalmic solution Place 1 drop into both eyes daily.   Yes [provider]  losartan (COZAAR) 25 MG tablet Take 1 tablet (25 mg total) by mouth daily. 04/20/17  Yes Biagio Borg, MD  omeprazole (PRILOSEC) 40 MG capsule Take 1 capsule (40 mg total) by mouth daily. 11/18/16  Yes Biagio Borg, MD  oxybutynin (DITROPAN-XL) 10 MG 24 hr tablet TAKE 1 TABLET (10 MG TOTAL) BY MOUTH DAILY. 08/02/15  Yes Biagio Borg, MD  promethazine (PHENERGAN) 12.5 MG tablet Take 1 tablet (12.5 mg total) by mouth every 6 (six) hours as needed for nausea or vomiting. 05/10/16  Yes [name], MD  tamsulosin (FLOMAX) 0.4 MG CAPS capsule  TAKE ONE CAPSULE BY MOUTH TWICE A DAY 01/04/17  Yes Biagio Borg, MD  traMADol (ULTRAM) 50 MG tablet Take 1 tablet (50 mg total) by mouth every 6 (six) hours as needed. for pain 04/20/17  Yes Biagio Borg, MD  XARELTO 20 MG TABS tablet TAKE 1 TABLET BY MOUTH EVERY DAY 01/21/17  Yes Biagio Borg, MD  acetaminophen (TYLENOL ARTHRITIS PAIN) 650 MG CR tablet Take 650 mg by mouth every 8 (eight) hours as needed for pain.    [provider]  ondansetron (ZOFRAN ODT) 4 MG disintegrating tablet Take 1 tablet (4 mg total) by mouth every 8 (eight) hours as needed for nausea or vomiting. 05/03/17   Kinnie Feil, PA-C    Family History Family History  Problem Relation Age of Onset  . Diabetes Mother   . Hypertension Mother   . Hypertension Father   . Lung cancer Father   . Cancer Father   . Asthma Sister   . Hypertension Sister   . Arthritis Paternal Grandmother   . Hypertension Brother   . Hypertension Daughter   .  Hypertension Son     Social History Social History  Substance Use Topics  . Smoking status: Former Smoker    Packs/day: 0.50    Years: 25.00    Types: Cigarettes    Quit date: 11/16/1978  . Smokeless tobacco: Former Systems developer    Types: Chew  . Alcohol use No     Allergies   Pantoprazole sodium   Review of Systems Review of Systems  Constitutional: Negative for chills and fever.  HENT: Negative for congestion and sore throat.   Eyes: Negative for visual disturbance.  Respiratory: Positive for shortness of breath. Negative for cough and chest tightness.   Cardiovascular: Negative for chest pain and palpitations.  Gastrointestinal: Positive for abdominal pain, diarrhea, nausea and vomiting. Negative for blood in stool and constipation.  Genitourinary: Negative for difficulty urinating, dysuria, frequency, hematuria and urgency.  Musculoskeletal: Negative for myalgias.  Skin: Negative for rash.  Neurological: Negative for syncope, weakness, light-headedness,  numbness and headaches.     Physical Exam Updated Vital Signs BP (!) 164/85   Pulse 73   Temp 99.8 F (37.7 C) (Rectal)   Resp 16   Ht 5\' 9"  (1.753 m)   Wt 85.7 kg (189 lb)   SpO2 95%   BMI 27.91 kg/m   Physical Exam  Constitutional: He is oriented to person, place, and time. He appears well-developed and well-nourished. No distress.  HENT:  Head: Normocephalic and atraumatic.  Nose: Nose normal.  Mouth/Throat: Oropharynx is clear and moist. No oropharyngeal exudate.  Dry mucous membranes.   Eyes: Conjunctivae and EOM are normal. Pupils are equal, round, and reactive to light.  Neck: Normal range of motion. Neck supple. No JVD present. No tracheal deviation present.  Cardiovascular: Normal rate, regular rhythm, normal heart sounds and intact distal pulses.   No murmur heard. Pulmonary/Chest: Effort normal and breath sounds normal. No respiratory distress. He has no wheezes. He has no rales.  Abdominal: Soft. Bowel sounds are normal. He exhibits no distension. There is no tenderness.  Abdomen is soft, non tender without distention, rigidity, guarding or rebound.  No signs of peritonitis. No suprapubic tenderness. No CVAT.  Negative Murphy's. Negative McBurney's. Negative Psoas sign.  Non palpable kidneys. No hepatosplenomegaly. No surgical abdominal scars noted.  No pulsating masses.  Active bowel sounds throughout.    Musculoskeletal: Normal range of motion. He exhibits no deformity.  Lymphadenopathy:    He has no cervical adenopathy.  Neurological: He is alert and oriented to person, place, and time.  Skin: Skin is warm and dry. Capillary refill takes less than 2 seconds.  Psychiatric: He has a normal mood and affect. His behavior is normal. Judgment and thought content normal.  Nursing note and vitals reviewed.    ED Treatments / Results   DIAGNOSTIC STUDIES: Oxygen Saturation is 100% on RA, normal by my interpretation.    COORDINATION OF CARE: 4:46 PM Discussed  treatment plan with pt at bedside which includes labs and pt agreed to plan.    Labs (all labs ordered are listed, but only abnormal results are displayed) Labs Reviewed  CBC WITH DIFFERENTIAL/PLATELET - Abnormal; Notable for the following:       Result Value   Lymphs Abs 0.6 (*)    All other components within normal limits  COMPREHENSIVE METABOLIC PANEL - Abnormal; Notable for the following:    CO2 21 (*)    Glucose, Bld 146 (*)    All other components within normal limits  URINALYSIS, ROUTINE W REFLEX  MICROSCOPIC - Abnormal; Notable for the following:    Hgb urine dipstick LARGE (*)    All other components within normal limits  URINALYSIS, MICROSCOPIC (REFLEX) - Abnormal; Notable for the following:    Bacteria, UA FEW (*)    Squamous Epithelial / LPF 0-5 (*)    All other components within normal limits  LIPASE, BLOOD  TROPONIN I  OCCULT BLOOD X 1 CARD TO LAB, STOOL    EKG  EKG Interpretation  Date/Time:  Monday May 03 2017 18:14:52 EDT Ventricular Rate:  77 PR Interval:    QRS Duration: 107 QT Interval:  496 QTC Calculation: 562 R Axis:   -3 Text Interpretation:  Sinus rhythm Abnormal T, consider ischemia, diffuse leads Prolonged QT interval Confirmed by Fredia Sorrow [phone]) on 05/03/2017 6:19:57 PM       Radiology Ct Abdomen Pelvis W Contrast  Result Date: 05/03/2017 CLINICAL DATA:  Generalized abdominal pain for 1 day. Vomiting. Nausea. EXAM: CT ABDOMEN AND PELVIS WITH CONTRAST TECHNIQUE: Multidetector CT imaging of the abdomen and pelvis was performed using the standard protocol following bolus administration of intravenous contrast. CONTRAST:  132mL ISOVUE-300 IOPAMIDOL (ISOVUE-300) INJECTION 61% COMPARISON:  CT 07/01/2015 FINDINGS: Lower chest: Pacemaker is partially included. Mild cardiomegaly. Atelectasis versus scarring at the left lung base with elevated left hemidiaphragm. Hepatobiliary: Scattered calcified granulomas. No focal hepatic lesion. No  gallstones, gallbladder wall thickening, or biliary dilatation. Pancreas: Parenchymal atrophy. No ductal dilatation or inflammation. A few tiny calcifications about pancreatic tail, unchanged from prior exam. Spleen: Normal in size without focal abnormality. Adrenals/Urinary Tract: No adrenal nodule. No hydronephrosis. Nonobstructing stones in the left kidney. Mild nonspecific perinephric edema, right greater than left. Homogeneous enhancement with symmetric renal excretion on delayed phase imaging. Urinary bladder is physiologically distended. Mild thickening of the adrenal glands without discrete nodule. Stomach/Bowel: Small hiatal hernia. Stomach distended with enteric contrast, no gastric wall thickening. No small bowel dilatation or inflammation. No bowel obstruction. Multifocal colonic diverticulosis, prominent in the descending and sigmoid colon. No discrete pericolonic or para diverticular inflammatory change. Normal appendix. Vascular/Lymphatic: Aortic and branch atherosclerosis without aneurysm. Retroperitoneal calcifications in the left upper quadrant and adjacent to the third portion of the duodenum are stable from prior exam, likely sequela of prior granulomatous disease. No noncalcified adenopathy. Reproductive: Stable enlarged prostate gland spanning 6.5 cm. Other: No free air, free fluid, or intra-abdominal fluid collection. Fat within the inguinal canals, right greater than left. Musculoskeletal: Stable degenerative change in the spine. There are no acute or suspicious osseous abnormalities. IMPRESSION: 1. No acute abnormality or explanation for abdominal pain. 2. Colonic diverticulosis without diverticulitis. 3. Abdominal aortic atherosclerosis, no aneurysm. 4. Nonobstructing left renal calculi. Electronically Signed   By: Jeb Levering M.D.   On: 05/03/2017 21:57    Procedures Procedures (including critical care time)  Medications Ordered in ED Medications  ondansetron (ZOFRAN)  injection 4 mg (4 mg Intravenous Given 05/03/17 1648)  sodium chloride 0.9 % bolus 1,000 mL (0 mLs Intravenous Stopped 05/03/17 1817)  morphine 4 MG/ML injection 4 mg (4 mg Intravenous Given 05/03/17 1710)  sodium chloride 0.9 % bolus 1,000 mL (0 mLs Intravenous Stopped 05/03/17 2142)  gi cocktail (Maalox,Lidocaine,Donnatal) (30 mLs Oral Given 05/03/17 1901)  acetaminophen (TYLENOL) tablet 650 mg (650 mg Oral Given 05/03/17 1859)  morphine 4 MG/ML injection 4 mg (4 mg Intravenous Given 05/03/17 1901)  iopamidol (ISOVUE-300) 61 % injection 100 mL (100 mLs Intravenous Contrast Given 05/03/17 2134)     Initial Impression /  Assessment and Plan / ED Course  I have reviewed the triage vital signs and the nursing notes.  Pertinent labs & imaging results that were available during my care of the patient were reviewed by me and considered in my medical decision making (see chart for details).  Clinical Course as of May 04 28  Mon May 03, 2017  1826 Sinus rhythm Abnormal T, consider ischemia, diffuse leads Prolonged QT interval (496) EKG 12-Lead [CG]  2221 IMPRESSION: 1. No acute abnormality or explanation for abdominal pain. 2. Colonic diverticulosis without diverticulitis. 3. Abdominal aortic atherosclerosis, no aneurysm. 4. Nonobstructing left renal calculi.  [CG]  2226 Reevaluated patient. Nausea, vomiting, diarrhea, abdominal pain resolved. Notified patient re pacemaker interrogation pending.   [CG]    Clinical Course User Index [CG] Kinnie Feil, PA-C   76 yo male presents to ED with sudden onset nausea, vomiting, diarrhea and abdominal pain since this morning.  On exam patient is non toxic. Mild diffuse abdominal tenderness initially. Vital signs reassuring upon initial evaluation and on repeat exam.  Signs of mild dehydration.   No red flag symptoms of n/v or diarrhea including no HA, CP or SOB, bloody diarrhea, coffee ground emesis.  No recent abx or travel.  No known PUD or previous  GIB.  No chronic use of NSAIDs or ETOH.  Patient felt fine when waking up, had a nectarine before onset of symptoms.  ?acute gastroenteritis.  Electrolytes, liver function tests, lipase and CBC are all normal today. U/A non contributory. Hemocult negative. EKG and trop x 1 normal. Given age, sudden onst of symptoms, will get CT scan.   CT scan negative. Doubt intra-abdominal/pelvic emergency causing nausea, vomiting and diarrhea. Pt received IVF, zofran, morphine. Symptoms completely resolved by discharge. Patient tolerated food and fluids in the ED without complications. Patient did not have episodes of emesis or diarrhea in the Ed.  Re-evaluation reassuring. Patient will be discharged at this time with zofran, push fluids and close f/u with PCP for worsening symptoms.    While on tele pt noted to have runs of V-tach.  Pacemaker was interrogated, all cleared from this stand point. Pt was asymptomatic during this.   Patient, ED treatment and discharge plan was discussed with supervising physician who also evaluated the patient and is agreeable with plan.    Final Clinical Impressions(s) / ED Diagnoses   Final diagnoses:  Abdominal pain  Nausea vomiting and diarrhea  Encounter for interrogation of cardiac pacemaker    New Prescriptions Discharge Medication List as of 05/03/2017 11:30 PM    START taking these medications   Details  ondansetron (ZOFRAN ODT) 4 MG disintegrating tablet Take 1 tablet (4 mg total) by mouth every 8 (eight) hours as needed for nausea or vomiting., Starting Mon 05/03/2017, Print        I personally performed the services described in this documentation, which was scribed in my presence. The recorded information has been reviewed and is accurate.    Kinnie Feil, PA-C 05/04/17 8182    Fredia Sorrow, MD 05/13/17 [phone]

## 2017-05-03 NOTE — Discharge Instructions (Signed)
Your lab work and imaging today was normal and reassuring. We interrogated your pacemaker and it is functioning appropriately. Your symptoms may be from a viral gastroenteritis or from recent intake of suspicious food.  Please ensure you stay well-hydrated. Take Zofran as needed for nausea. Monitor symptoms, return to the ED if they worsen.

## 2017-05-03 NOTE — ED Notes (Signed)
Attempted to interigate pacemaker. Unable at this time. MD aware

## 2017-05-06 ENCOUNTER — Ambulatory Visit (INDEPENDENT_AMBULATORY_CARE_PROVIDER_SITE_OTHER): Payer: Medicare Other | Admitting: Internal Medicine

## 2017-05-06 ENCOUNTER — Encounter: Payer: Self-pay | Admitting: Internal Medicine

## 2017-05-06 VITALS — BP 102/64 | HR 72 | Ht 69.0 in | Wt 177.0 lb

## 2017-05-06 DIAGNOSIS — I10 Essential (primary) hypertension: Secondary | ICD-10-CM

## 2017-05-06 DIAGNOSIS — R197 Diarrhea, unspecified: Secondary | ICD-10-CM | POA: Diagnosis not present

## 2017-05-06 DIAGNOSIS — R112 Nausea with vomiting, unspecified: Secondary | ICD-10-CM

## 2017-05-06 DIAGNOSIS — R739 Hyperglycemia, unspecified: Secondary | ICD-10-CM

## 2017-05-06 NOTE — Progress Notes (Signed)
Subjective:    Patient ID: [name], male    DOB: 1941-06-03, 76 y.o.   MRN: [id number]  HPI  Here to f/u recent ED 6/18 visit with n/v/d; labs and CT neg for acute, PPM interrogated and no issue, exam improved with ED tx, with symptoms essentially resolved by next day.  Pt denies fever, wt loss, night sweats, loss of appetite, or other constitutional symptoms  Denies worsening reflux, abd pain, dysphagia, n/v, bowel change or blood.   Pt denies polydipsia, polyuria.  Pt denies chest pain, increased sob or doe, wheezing, orthopnea, PND, increased LE swelling, palpitations, but still; with some lower BP and mild dizziness with postural changes Past Medical History:  Diagnosis Date  . Abdominal pain, epigastric   . Allergic rhinitis, cause unspecified   . Arthritis   . BPH (benign prostatic hyperplasia)   . CHF (congestive heart failure) ([place])   . Chronic anticoagulation    xarelto  . Coronary artery calcification seen on CAT scan 12/26/2012  . Depressive disorder, not elsewhere classified   . Diverticulosis of colon (without mention of hemorrhage)   . Esophageal reflux   . High risk medication use   . Hyperlipidemia   . Hyperplastic rectal polyp   . Hypertension   . Lumbago   . Pacemaker   . Paralyzed hemidiaphragm    LEFT  . Persistent atrial fibrillation ([place])   . Sick sinus syndrome [facility]) July 2010   s/p PPM [facility]) by [name]   Past Surgical History:  Procedure Laterality Date  . ESOPHAGOGASTRODUODENOSCOPY N/A 12/05/2013   Procedure: ESOPHAGOGASTRODUODENOSCOPY (EGD);  Surgeon: [name], MD;  Location: [facility];  Service: Endoscopy;  Laterality: N/A;  . ESOPHAGOGASTRODUODENOSCOPY N/A 07/04/2015   Procedure: ESOPHAGOGASTRODUODENOSCOPY (EGD);  Surgeon: [name], MD;  Location: [place];  Service: Endoscopy;  Laterality: N/A;  . INSERT / REPLACE / REMOVE PACEMAKER  05/2009    [employer] ALTRUA 60  (serial number [id number]) dual-chamber pacemaker  .  PACEMAKER PLACEMENT Bilateral    '05    reports that he quit smoking about 38 years ago. His smoking use included Cigarettes. He has a 12.50 pack-year smoking history. He has quit using smokeless tobacco. His smokeless tobacco use included Chew. He reports that he does not drink alcohol or use drugs. family history includes Arthritis in his paternal grandmother; Asthma in his sister; Cancer in his father; Diabetes in his mother; Hypertension in his brother, daughter, father, mother, sister, and son; Lung cancer in his father. Allergies  Allergen Reactions  . Pantoprazole Sodium Itching   Current Outpatient Prescriptions on File Prior to Visit  Medication Sig Dispense Refill  . acetaminophen (TYLENOL ARTHRITIS PAIN) 650 MG CR tablet Take 650 mg by mouth every 8 (eight) hours as needed for pain.    Marland Kitchen amLODipine (NORVASC) 5 MG tablet Take 1 tablet (5 mg total) by mouth daily. 90 tablet 3  . atorvastatin (LIPITOR) 80 MG tablet Take 1 tablet (80 mg total) by mouth daily. 90 tablet 3  . diclofenac sodium (VOLTAREN) 1 % GEL Apply 4 g topically 4 (four) times daily as needed. 400 g 5  . fexofenadine (ALLEGRA) 180 MG tablet TAKE 1 TABLET (180 MG TOTAL) BY MOUTH DAILY. 90 tablet 1  . flecainide (TAMBOCOR) 100 MG tablet TAKE 1 TABLET BY MOUTH TWICE A DAY 180 tablet 3  . latanoprost (XALATAN) 0.005 % ophthalmic solution Place 1 drop into both eyes daily.    Marland Kitchen losartan (COZAAR) 25  MG tablet Take 1 tablet (25 mg total) by mouth daily. 90 tablet 3  . omeprazole (PRILOSEC) 40 MG capsule Take 1 capsule (40 mg total) by mouth daily. 90 capsule 3  . ondansetron (ZOFRAN ODT) 4 MG disintegrating tablet Take 1 tablet (4 mg total) by mouth every 8 (eight) hours as needed for nausea or vomiting. 20 tablet 0  . oxybutynin (DITROPAN-XL) 10 MG 24 hr tablet TAKE 1 TABLET (10 MG TOTAL) BY MOUTH DAILY. 90 tablet 1  . promethazine (PHENERGAN) 12.5 MG tablet Take 1 tablet (12.5 mg total) by mouth every 6 (six) hours as needed  for nausea or vomiting. 10 tablet 0  . tamsulosin (FLOMAX) 0.4 MG CAPS capsule TAKE ONE CAPSULE BY MOUTH TWICE A DAY 60 capsule 11  . traMADol (ULTRAM) 50 MG tablet Take 1 tablet (50 mg total) by mouth every 6 (six) hours as needed. for pain 120 tablet 5  . XARELTO 20 MG TABS tablet TAKE 1 TABLET BY MOUTH EVERY DAY 90 tablet 1  . [DISCONTINUED] furosemide (LASIX) 40 MG tablet Take 1 tablet (40 mg total) by mouth daily. 30 tablet 11   No current facility-administered medications on file prior to visit.    Review of Systems  Constitutional: Negative for other unusual diaphoresis or sweats HENT: Negative for ear discharge or swelling Eyes: Negative for other worsening visual disturbances Respiratory: Negative for stridor or other swelling  Gastrointestinal: Negative for worsening distension or other blood Genitourinary: Negative for retention or other urinary change Musculoskeletal: Negative for other MSK pain or swelling Skin: Negative for color change or other new lesions Neurological: Negative for worsening tremors and other numbness  Psychiatric/Behavioral: Negative for worsening agitation or other fatigue All other system neg per pt    Objective:   Physical Exam BP 102/64   Pulse 72   Ht 5\' 9"  (1.753 m)   Wt 177 lb (80.3 kg)   SpO2 99%   BMI 26.14 kg/m  VS noted,  Constitutional: Pt appears in NAD HENT: Head: NCAT.  Right Ear: External ear normal.  Left Ear: External ear normal.  Eyes: . Pupils are equal, round, and reactive to light. Conjunctivae and EOM are normal Nose: without d/c or deformity Neck: Neck supple. Gross normal ROM Cardiovascular: Normal rate and regular rhythm.   Pulmonary/Chest: Effort normal and breath sounds without rales or wheezing.  Neurological: Pt is alert. At baseline orientation, motor grossly intact Skin: Skin is warm. No rashes, other new lesions, no LE edema Psychiatric: Pt behavior is normal without agitation  No other exam findings  Lab  Results  Component Value Date   WBC 8.6 05/03/2017   HGB 15.0 05/03/2017   HCT 42.7 05/03/2017   PLT 186 05/03/2017   GLUCOSE 146 (H) 05/03/2017   CHOL 201 (H) 11/18/2016   TRIG 81.0 11/18/2016   HDL 80.40 11/18/2016   LDLDIRECT 183.4 12/26/2013   LDLCALC 105 (H) 11/18/2016   ALT 26 05/03/2017   AST 34 05/03/2017   NA 140 05/03/2017   K 3.7 05/03/2017   CL 107 05/03/2017   CREATININE 0.68 05/03/2017   BUN 13 05/03/2017   CO2 21 (L) 05/03/2017   TSH 0.93 11/18/2016   PSA 1.91 04/20/2017   INR 1.45 07/01/2015   HGBA1C 6.2 11/18/2016       Assessment & Plan:

## 2017-05-06 NOTE — Patient Instructions (Signed)
OK to hold off on the losartan and the amlodipine until your Blood pressure improves to closer to 140/90   OK to drink plenty of fluids in the next few days  Please continue all other medications as before, and refills have been done if requested.  Please have the pharmacy call with any other refills you may need.  Please keep your appointments with your specialists as you may have planned

## 2017-05-08 DIAGNOSIS — R197 Diarrhea, unspecified: Principal | ICD-10-CM

## 2017-05-08 DIAGNOSIS — R112 Nausea with vomiting, unspecified: Secondary | ICD-10-CM | POA: Insufficient documentation

## 2017-05-08 NOTE — Assessment & Plan Note (Signed)
With lower BP recently with n/v/d now improved - ? Viral illness, to cont to hold until BP tends to increase likely in the next 1-2 wks, to drink plenty of fluids

## 2017-05-08 NOTE — Assessment & Plan Note (Signed)
?   Viral, symptoms resolved, exam benign,  to f/u any worsening symptoms or concerns

## 2017-05-08 NOTE — Assessment & Plan Note (Signed)
stable overall by history and exam, recent data reviewed with pt, and pt to continue medical treatment as before,  to f/u any worsening symptoms or concerns Lab Results  Component Value Date   HGBA1C 6.2 11/18/2016   

## 2017-06-10 ENCOUNTER — Ambulatory Visit (INDEPENDENT_AMBULATORY_CARE_PROVIDER_SITE_OTHER): Payer: Medicare Other | Admitting: *Deleted

## 2017-06-10 DIAGNOSIS — I495 Sick sinus syndrome: Secondary | ICD-10-CM

## 2017-06-10 LAB — CUP PACEART INCLINIC DEVICE CHECK
Brady Statistic RA Percent Paced: 87 %
Date Time Interrogation Session: 20180726040000
Implantable Lead Location: 753859
Implantable Lead Model: 4136
Implantable Lead Serial Number: 28454141
Lead Channel Pacing Threshold Amplitude: 1.2 V
Lead Channel Pacing Threshold Pulse Width: 0.4 ms
Lead Channel Pacing Threshold Pulse Width: 1 ms
Lead Channel Sensing Intrinsic Amplitude: 3.5 mV
Lead Channel Setting Pacing Amplitude: 2 V
Lead Channel Setting Pacing Pulse Width: 1 ms
Lead Channel Setting Sensing Sensitivity: 2.5 mV
MDC IDC LEAD IMPLANT DT: 20100719
MDC IDC LEAD IMPLANT DT: 20100719
MDC IDC LEAD LOCATION: 753860
MDC IDC LEAD SERIAL: 28594836
MDC IDC MSMT LEADCHNL RA IMPEDANCE VALUE: 560 Ohm
MDC IDC MSMT LEADCHNL RA PACING THRESHOLD AMPLITUDE: 0.6 V
MDC IDC MSMT LEADCHNL RV IMPEDANCE VALUE: 670 Ohm
MDC IDC PG IMPLANT DT: 20100719
MDC IDC PG SERIAL: 764370
MDC IDC SET LEADCHNL RV PACING AMPLITUDE: 2.8 V
MDC IDC STAT BRADY RV PERCENT PACED: 18 %

## 2017-06-10 NOTE — Progress Notes (Signed)
Pacemaker check in clinic. Normal device function. Thresholds, sensing, impedances consistent with previous measurements. Device programmed to maximize longevity. 67 mode switches (0%)+Xarelto, EGMs appear AT/AF. No high ventricular rates noted. Device programmed at appropriate safety margins. Histogram distribution appropriate for patient activity level. Device programmed to optimize intrinsic conduction. Estimated longevity 2.0 years. ROV with JA 11/2017.

## 2017-07-14 DIAGNOSIS — H2511 Age-related nuclear cataract, right eye: Secondary | ICD-10-CM | POA: Diagnosis not present

## 2017-07-14 DIAGNOSIS — H401131 Primary open-angle glaucoma, bilateral, mild stage: Secondary | ICD-10-CM | POA: Diagnosis not present

## 2017-07-14 DIAGNOSIS — H43812 Vitreous degeneration, left eye: Secondary | ICD-10-CM | POA: Diagnosis not present

## 2017-07-14 DIAGNOSIS — H25812 Combined forms of age-related cataract, left eye: Secondary | ICD-10-CM | POA: Diagnosis not present

## 2017-07-29 DIAGNOSIS — Z23 Encounter for immunization: Secondary | ICD-10-CM | POA: Diagnosis not present

## 2017-09-24 ENCOUNTER — Other Ambulatory Visit: Payer: Self-pay | Admitting: Internal Medicine

## 2017-10-18 ENCOUNTER — Other Ambulatory Visit: Payer: Self-pay | Admitting: Internal Medicine

## 2017-10-18 NOTE — Telephone Encounter (Signed)
Done erx 

## 2017-10-26 ENCOUNTER — Ambulatory Visit: Payer: Medicare Other | Admitting: Internal Medicine

## 2017-10-30 ENCOUNTER — Other Ambulatory Visit: Payer: Self-pay | Admitting: Internal Medicine

## 2017-11-01 ENCOUNTER — Encounter: Payer: Self-pay | Admitting: Internal Medicine

## 2017-11-01 ENCOUNTER — Other Ambulatory Visit (INDEPENDENT_AMBULATORY_CARE_PROVIDER_SITE_OTHER): Payer: Medicare Other

## 2017-11-01 ENCOUNTER — Ambulatory Visit (INDEPENDENT_AMBULATORY_CARE_PROVIDER_SITE_OTHER): Payer: Medicare Other | Admitting: Internal Medicine

## 2017-11-01 VITALS — BP 124/86 | HR 75 | Temp 98.6°F | Ht 69.0 in | Wt 191.0 lb

## 2017-11-01 DIAGNOSIS — Z0000 Encounter for general adult medical examination without abnormal findings: Secondary | ICD-10-CM

## 2017-11-01 DIAGNOSIS — R319 Hematuria, unspecified: Secondary | ICD-10-CM | POA: Diagnosis not present

## 2017-11-01 DIAGNOSIS — I10 Essential (primary) hypertension: Secondary | ICD-10-CM | POA: Diagnosis not present

## 2017-11-01 DIAGNOSIS — R739 Hyperglycemia, unspecified: Secondary | ICD-10-CM | POA: Diagnosis not present

## 2017-11-01 LAB — URINALYSIS, ROUTINE W REFLEX MICROSCOPIC
Bilirubin Urine: NEGATIVE
Hgb urine dipstick: NEGATIVE
Ketones, ur: NEGATIVE
Leukocytes, UA: NEGATIVE
Nitrite: NEGATIVE
PH: 6 (ref 5.0–8.0)
SPECIFIC GRAVITY, URINE: 1.015 (ref 1.000–1.030)
Total Protein, Urine: NEGATIVE
URINE GLUCOSE: NEGATIVE
UROBILINOGEN UA: 0.2 (ref 0.0–1.0)

## 2017-11-01 NOTE — Assessment & Plan Note (Signed)
stable overall by history and exam, recent data reviewed with pt, and pt to continue medical treatment as before,  to f/u any worsening symptoms or concerns Lab Results  Component Value Date   HGBA1C 6.2 11/18/2016

## 2017-11-01 NOTE — Progress Notes (Signed)
Subjective:    Patient ID: [name], male    DOB: [birth year], 76 y.o.   MRN: [id number]  HPI  Here to f/u; overall doing ok,  Pt denies chest pain, increasing sob or doe, wheezing, orthopnea, PND, increased LE swelling, palpitations, dizziness or syncope.  Pt denies new neurological symptoms such as new headache, or facial or extremity weakness or numbness.  Pt denies polydipsia, polyuria, or low sugar episode.  Pt states overall good compliance with meds, mostly trying to follow appropriate diet, with wt overall stable,  but little exercise however.  Denies urinary symptoms such as dysuria, frequency, urgency, flank pain, or n/v, fever, chills, but did have hematuria not addressed at last ED visit about 6 mo ago, was on xarelto at the time, has not had previous evaluation, UA noted as "non contributory" in ED note BP Readings from Last 3 Encounters:  11/01/17 124/86  05/06/17 102/64  05/03/17 (!) 164/85   Wt Readings from Last 3 Encounters:  11/01/17 191 lb (86.6 kg)  05/06/17 177 lb (80.3 kg)  05/03/17 189 lb (85.7 kg)   Past Medical History:  Diagnosis Date  . Abdominal pain, epigastric   . Allergic rhinitis, cause unspecified   . Arthritis   . BPH (benign prostatic hyperplasia)   . CHF (congestive heart failure) ([place])   . Chronic anticoagulation    xarelto  . Coronary artery calcification seen on CAT scan 12/26/2012  . Depressive disorder, not elsewhere classified   . Diverticulosis of colon (without mention of hemorrhage)   . Esophageal reflux   . High risk medication use   . Hyperlipidemia   . Hyperplastic rectal polyp   . Hypertension   . Lumbago   . Pacemaker   . Paralyzed hemidiaphragm    LEFT  . Persistent atrial fibrillation ([place])   . Sick sinus syndrome [facility]) July 2010   s/p PPM [facility]) by [name]   Past Surgical History:  Procedure Laterality Date  . ESOPHAGOGASTRODUODENOSCOPY N/A 12/05/2013   Procedure: ESOPHAGOGASTRODUODENOSCOPY (EGD);  Surgeon: [name], MD;  Location: [facility];  Service: Endoscopy;  Laterality: N/A;  . ESOPHAGOGASTRODUODENOSCOPY N/A 07/04/2015   Procedure: ESOPHAGOGASTRODUODENOSCOPY (EGD);  Surgeon: [name], MD;  Location: [place];  Service: Endoscopy;  Laterality: N/A;  . INSERT / REPLACE / REMOVE PACEMAKER  05/2009    [employer] ALTRUA 60  (serial number [id number]) dual-chamber pacemaker  . PACEMAKER PLACEMENT Bilateral    '05    reports that he quit smoking about 38 years ago. His smoking use included cigarettes. He has a 12.50 pack-year smoking history. He has quit using smokeless tobacco. His smokeless tobacco use included chew. He reports that he does not drink alcohol or use drugs. family history includes Arthritis in his paternal grandmother; Asthma in his sister; Cancer in his father; Diabetes in his mother; Hypertension in his brother, daughter, father, mother, sister, and son; Lung cancer in his father. Allergies  Allergen Reactions  . Pantoprazole Sodium Itching   Current Outpatient Medications on File Prior to Visit  Medication Sig Dispense Refill  . acetaminophen (TYLENOL ARTHRITIS PAIN) 650 MG CR tablet Take 650 mg by mouth every 8 (eight) hours as needed for pain.    Marland Kitchen amLODipine (NORVASC) 5 MG tablet Take 1 tablet (5 mg total) by mouth daily. 90 tablet 3  . atorvastatin (LIPITOR) 80 MG tablet Take 1 tablet (80 mg total) by mouth daily. 90 tablet 3  . diclofenac sodium (VOLTAREN) 1 %  GEL Apply 4 g topically 4 (four) times daily as needed. 400 g 5  . fexofenadine (ALLEGRA) 180 MG tablet TAKE 1 TABLET (180 MG TOTAL) BY MOUTH DAILY. 90 tablet 1  . flecainide (TAMBOCOR) 100 MG tablet TAKE 1 TABLET BY MOUTH TWICE A DAY 180 tablet 3  . latanoprost (XALATAN) 0.005 % ophthalmic solution Place 1 drop into both eyes daily.    Marland Kitchen losartan (COZAAR) 25 MG tablet Take 1 tablet (25 mg total) by mouth daily. 90 tablet 3  . omeprazole (PRILOSEC) 40 MG capsule TAKE 1 CAPSULE (40 MG TOTAL) BY MOUTH  DAILY. 90 capsule 1  . oxybutynin (DITROPAN-XL) 10 MG 24 hr tablet TAKE 1 TABLET (10 MG TOTAL) BY MOUTH DAILY. 90 tablet 1  . tamsulosin (FLOMAX) 0.4 MG CAPS capsule TAKE ONE CAPSULE BY MOUTH TWICE A DAY 60 capsule 11  . traMADol (ULTRAM) 50 MG tablet TAKE 1 TABLET BY MOUTH EVERY 6 HOURS AS NEEDED FOR PAIN 120 tablet 5  . XARELTO 20 MG TABS tablet TAKE 1 TABLET BY MOUTH EVERY DAY 90 tablet 0  . [DISCONTINUED] furosemide (LASIX) 40 MG tablet Take 1 tablet (40 mg total) by mouth daily. 30 tablet 11   No current facility-administered medications on file prior to visit.    Review of Systems  Constitutional: Negative for other unusual diaphoresis or sweats HENT: Negative for ear discharge or swelling Eyes: Negative for other worsening visual disturbances Respiratory: Negative for stridor or other swelling  Gastrointestinal: Negative for worsening distension or other blood Genitourinary: Negative for retention or other urinary change Musculoskeletal: Negative for other MSK pain or swelling Skin: Negative for color change or other new lesions Neurological: Negative for worsening tremors and other numbness  Psychiatric/Behavioral: Negative for worsening agitation or other fatigue All other system neg per pt    Objective:   Physical Exam BP 124/86   Pulse 75   Temp 98.6 F (37 C) (Oral)   Ht 5\' 9"  (1.753 m)   Wt 191 lb (86.6 kg)   SpO2 98%   BMI 28.21 kg/m  VS noted,  Constitutional: Pt appears in NAD HENT: Head: NCAT.  Right Ear: External ear normal.  Left Ear: External ear normal.  Eyes: . Pupils are equal, round, and reactive to light. Conjunctivae and EOM are normal Nose: without d/c or deformity Neck: Neck supple. Gross normal ROM Cardiovascular: Normal rate and regular rhythm.   Pulmonary/Chest: Effort normal and breath sounds without rales or wheezing.  Abd:  Soft, NT, ND, + BS, no organomegaly Neurological: Pt is alert. At baseline orientation, motor grossly intact Skin:  Skin is warm. No rashes, other new lesions, no LE edema Psychiatric: Pt behavior is normal without agitation  No other exam findings Lab Results  Component Value Date   WBC 8.6 05/03/2017   HGB 15.0 05/03/2017   HCT 42.7 05/03/2017   PLT 186 05/03/2017   GLUCOSE 146 (H) 05/03/2017   CHOL 201 (H) 11/18/2016   TRIG 81.0 11/18/2016   HDL 80.40 11/18/2016   LDLDIRECT 183.4 12/26/2013   LDLCALC 105 (H) 11/18/2016   ALT 26 05/03/2017   AST 34 05/03/2017   NA 140 05/03/2017   K 3.7 05/03/2017   CL 107 05/03/2017   CREATININE 0.68 05/03/2017   BUN 13 05/03/2017   CO2 21 (L) 05/03/2017   TSH 0.93 11/18/2016   PSA 1.91 04/20/2017   INR 1.45 07/01/2015   HGBA1C 6.2 11/18/2016   Urinalysis, Routine w reflex microscopic  Important Suggestion Newer  results are available. Click to view them now.   Ref Range & Units 56mo ago (05/03/17) 76mo ago (11/18/16) 47yr ago (05/10/16) 21yr ago (07/01/15) 59yr ago (01/01/15)  Color, Urine YELLOW YELLOW  YELLOW R YELLOW  YELLOW  YELLOW R  APPearance CLEAR CLEAR  CLEAR R CLEAR  CLEAR  CLEAR R  Specific Gravity, Urine 1.005 - 1.030 1.020  1.020 R 1.023  1.016  1.025 R  pH 5.0 - 8.0 7.0  6.0  6.5  6.5  6.0   Glucose, UA NEGATIVE mg/dL NEGATIVE   NEGATIVE  NEGATIVE    Hgb urine dipstick NEGATIVE LARGE Abnormal  L NEGATIVE R TRACE Abnormal   NEGATIVE  NEGATIVE R  Bilirubin Urine NEGATIVE NEGATIVE  NEGATIVE R NEGATIVE  NEGATIVE  NEGATIVE R  Ketones, ur NEGATIVE mg/dL NEGATIVE  NEGATIVE R NEGATIVE  NEGATIVE  NEGATIVE R  Protein, ur NEGATIVE mg/dL NEGATIVE   NEGATIVE  NEGATIVE    Nitrite NEGATIVE NEGATIVE  NEGATIVE R NEGATIVE  NEGATIVE  NEGATIVE R  Leukocytes, UA NEGATIVE NEGATIVE             Ref Range & Units 78mo ago 82mo ago  RBC / HPF 0 - 5 RBC/hpf TOO NUMEROUS TO COUNT  none seen R  WBC, UA 0 - 5 WBC/hpf 0-5  none seen R  Bacteria, UA NONE SEEN FEW Abnormal     Squamous Epithelial / LPF NONE SEEN 0-5 Abnormal     Mucus  PRESENT         Assessment &  Plan:

## 2017-11-01 NOTE — Assessment & Plan Note (Signed)
stable overall by history and exam, recent data reviewed with pt, and pt to continue medical treatment as before,  to f/u any worsening symptoms or concerns BP Readings from Last 3 Encounters:  11/01/17 124/86  05/06/17 102/64  05/03/17 (!) 164/85

## 2017-11-01 NOTE — Assessment & Plan Note (Signed)
On xarelto, asympt and denies GU symptoms, for repeat UA today and refer urology

## 2017-11-01 NOTE — Patient Instructions (Addendum)
Please continue all other medications as before, and refills have been done if requested.  Please have the pharmacy call with any other refills you may need.  Please continue your efforts at being more active, low cholesterol diet, and weight control.  You are otherwise up to date with prevention measures today.  Please keep your appointments with your specialists as you may have planned  You will be contacted regarding the referral for: Urology, and the colonoscopy  Please go to the LAB in the Basement (turn left off the elevator) for the tests to be done today - just the urine testing today  You will be contacted by phone if any changes need to be made immediately.  Otherwise, you will receive a letter about your results with an explanation, but please check with MyChart first.  Please remember to sign up for MyChart if you have not done so, as this will be important to you in the future with finding out test results, communicating by private email, and scheduling acute appointments online when needed.  Please return in 6 months, or sooner if needed

## 2017-11-25 ENCOUNTER — Encounter: Payer: Self-pay | Admitting: Physician Assistant

## 2017-11-25 ENCOUNTER — Telehealth: Payer: Self-pay | Admitting: *Deleted

## 2017-11-25 ENCOUNTER — Other Ambulatory Visit: Payer: Self-pay | Admitting: *Deleted

## 2017-11-25 ENCOUNTER — Ambulatory Visit (INDEPENDENT_AMBULATORY_CARE_PROVIDER_SITE_OTHER): Payer: Medicare Other | Admitting: Physician Assistant

## 2017-11-25 VITALS — BP 152/90 | HR 76 | Ht 69.0 in | Wt 197.8 lb

## 2017-11-25 DIAGNOSIS — Z7901 Long term (current) use of anticoagulants: Secondary | ICD-10-CM

## 2017-11-25 DIAGNOSIS — Z86010 Personal history of colonic polyps: Secondary | ICD-10-CM | POA: Diagnosis not present

## 2017-11-25 DIAGNOSIS — Z7189 Other specified counseling: Secondary | ICD-10-CM

## 2017-11-25 DIAGNOSIS — Z1211 Encounter for screening for malignant neoplasm of colon: Secondary | ICD-10-CM

## 2017-11-25 NOTE — Progress Notes (Signed)
Subjective:    Patient ID: [name], male    DOB: 23-Jun-1941, 77 y.o.   MRN: [id number]  HPI [name] is a pleasant 77 year old African-American male, who comes in today to discuss recall colonoscopy. He was referred by Dr. Cathlean Cower. Patient had last undergone colonoscopy in 2009 per Dr. Delfin Edis with finding of 3 diminutive colon polyps which were removed from the left colon and found to be hyperplastic. He also had left colon diverticulosis. He had EGD in 2016 per Dr. Deatra Ina which was normal. Patient has no current GI complaints, specifically denies any problems with abdominal discomfort, changes in bowel habits melena or hematochezia. He has history of sick sinus syndrome is status post pacemaker, hypertension, coronary artery disease, atrial fibrillation for which she is on Xarelto and GERD. He says he hasn't had any recent issues with the atrial fibrillation and is followed by Dr. Rayann Heman.  Review of Systems Pertinent positive and negative review of systems were noted in the above HPI section.  All other review of systems was otherwise negative.  Outpatient Encounter Medications as of 11/25/2017  Medication Sig  . acetaminophen (TYLENOL ARTHRITIS PAIN) 650 MG CR tablet Take 650 mg by mouth every 8 (eight) hours as needed for pain.  Marland Kitchen atorvastatin (LIPITOR) 80 MG tablet Take 1 tablet (80 mg total) by mouth daily.  . diclofenac sodium (VOLTAREN) 1 % GEL Apply 4 g topically 4 (four) times daily as needed.  . fexofenadine (ALLEGRA) 180 MG tablet TAKE 1 TABLET (180 MG TOTAL) BY MOUTH DAILY.  . flecainide (TAMBOCOR) 100 MG tablet TAKE 1 TABLET BY MOUTH TWICE A DAY  . latanoprost (XALATAN) 0.005 % ophthalmic solution Place 1 drop into both eyes daily.  Marland Kitchen losartan (COZAAR) 25 MG tablet Take 1 tablet (25 mg total) by mouth daily.  Marland Kitchen omeprazole (PRILOSEC) 40 MG capsule TAKE 1 CAPSULE (40 MG TOTAL) BY MOUTH DAILY.  Marland Kitchen oxybutynin (DITROPAN-XL) 10 MG 24 hr tablet TAKE 1 TABLET (10 MG TOTAL) BY MOUTH  DAILY.  . tamsulosin (FLOMAX) 0.4 MG CAPS capsule TAKE ONE CAPSULE BY MOUTH TWICE A DAY  . traMADol (ULTRAM) 50 MG tablet TAKE 1 TABLET BY MOUTH EVERY 6 HOURS AS NEEDED FOR PAIN  . XARELTO 20 MG TABS tablet TAKE 1 TABLET BY MOUTH EVERY DAY  . amLODipine (NORVASC) 5 MG tablet Take 1 tablet (5 mg total) by mouth daily.   No facility-administered encounter medications on file as of 11/25/2017.    Allergies  Allergen Reactions  . Pantoprazole Sodium Itching   Patient Active Problem List   Diagnosis Date Noted  . Hematuria 11/01/2017  . Nausea vomiting and diarrhea 05/08/2017  . Lumbar radiculopathy 12/01/2016  . Dizziness 11/18/2016  . Nocturia 11/18/2016  . Hyperglycemia 11/18/2016  . Bilateral hip pain 10/20/2016  . Bilateral ankle pain 10/20/2016  . Bilateral hand pain 10/20/2016  . Fatigue 04/28/2016  . Acute sinus infection 01/28/2016  . Cough 01/28/2016  . Chronic pain 07/11/2015  . Alcohol abuse 07/01/2015  . Increased prostate specific antigen (PSA) velocity 06/26/2014  . Neck mass 12/26/2013  . Arthritis of ankle or foot, degenerative 12/16/2013  . Retrocalcaneal bursitis 12/16/2013  . Abdominal pain 11/14/2013  . Near syncope 05/06/2013  . abnormal (EKG) 05/06/2013  . Dry mouth 03/02/2013  . Coronary artery calcification seen on CAT scan 12/26/2012  . Lumbar back pain 12/26/2012  . 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  . Leg pain, right 04/17/2012  . Leg swelling 04/17/2012  .  DOE (dyspnea on exertion) 02/26/2012  . Preventative health care 12/24/2011  . Cervical radiculopathy 12/24/2011  . Shortness of breath 12/18/2010  . SYNCOPE 12/04/2010  . FREQUENCY, URINARY 11/24/2010  . NECK PAIN, RIGHT 08/21/2010  . Atrial fibrillation ([place]) 05/29/2010  . Sick sinus syndrome ([place]) 05/20/2010  . NAUSEA AND VOMITING 05/20/2010  . Rhabdomyolysis 03/25/2010  . SHOULDER PAIN, RIGHT 12/09/2009  . ACHILLES TENDINITIS 12/09/2009  . BPH (benign prostatic hyperplasia) 06/26/2009    . WEIGHT LOSS-ABNORMAL 06/05/2009  . NAUSEA 06/05/2009  . PERSONAL HX COLONIC POLYPS 06/05/2009  . BRADYCARDIA 05/27/2009  . FATIGUE 05/27/2009  . Hyperlipidemia 05/30/2008  . Depression 05/30/2008  . Essential hypertension 05/30/2008  . ALLERGIC RHINITIS 05/30/2008  . GERD 05/30/2008  . Diverticulosis of colon (without mention of hemorrhage) 05/30/2008  . OVERACTIVE BLADDER 05/30/2008  . LOW BACK PAIN 05/30/2008   Social History   Socioeconomic History  . Marital status: Married    Spouse name: Not on file  . Number of children: Not on file  . Years of education: Not on file  . Highest education level: Not on file  Social Needs  . Financial resource strain: Not on file  . Food insecurity - worry: Not on file  . Food insecurity - inability: Not on file  . Transportation needs - medical: Not on file  . Transportation needs - non-medical: Not on file  Occupational History  . Occupation: retired    Fish farm manager: RETIRED  Tobacco Use  . Smoking status: Former Smoker    Packs/day: 0.50    Years: 25.00    Pack years: 12.50    Types: Cigarettes    Last attempt to quit: 11/16/1978    Years since quitting: 39.0  . Smokeless tobacco: Former Systems developer    Types: Chew  Substance and Sexual Activity  . Alcohol use: No  . Drug use: No  . Sexual activity: Not on file  Other Topics Concern  . 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     Mr. Eltzroth family history includes Arthritis in his paternal grandmother; Asthma in his sister; Cancer in his father; Diabetes in his mother; Hypertension in his brother, daughter, father, mother, sister, and son; Lung cancer in his father.      Objective:    Vitals:   11/25/17 0857  BP: (!) 152/90  Pulse: 76    Physical Exam; developed older African-American male in no acute distress, pleasant blood pressure 152/90 pulse 76, height 5 foot 9, weight 197, BMI 29.2. HEENT; nontraumatic normocephalic EOMI PERRLA sclera  anicteric, Cardiovascular; regular rate and rhythm with S1-S2 no murmur rub or gallop, Pulmonary; clear bilaterally, Abdomen ;soft nontender nondistended bowel sounds present no palpable mass or hepatosplenomegaly, Rectal ;exam not done, Extremities ;no clubbing cyanosis or edema skin warm and dry, Neuropsych; mood and affect appropriate       Assessment & Plan:   #38 77 year old male due for follow-up screening colonoscopy. Patient last had colonoscopy 2009 with diminutive hyperplastic polyps found as well as diverticulosis. Is currently asymptomatic. #2 chronic anticoagulation-on Xarelto #3 atrial fibrillation #4 sick sinus syndrome status post pacemaker #5 GERD #6 coronary disease  Plan;Patient will be scheduled for colonoscopy with Dr. Havery Moros. Procedure was discussed in detail with the patient including indications risks and benefits and he is agreeable to proceed. Patient will need to hold Xarelto for 24-48 hours prior to procedure. We will communicate with his cardiologist Dr.  Rayann Heman to  assure  this is reasonable for this patient.   Carena Stream S Loredana Medellin PA-C 11/25/2017   Cc: Biagio Borg, MD

## 2017-11-25 NOTE — Telephone Encounter (Signed)
Pt takes Xarelto for afib with CHADS2VASc score of 3 (age, HTN, CAD). Renal function is normal. Ok to hold Xarelto for 1-2 days prior to procedure as required.

## 2017-11-25 NOTE — Telephone Encounter (Signed)
11/25/2017   RE: [name] DOB: 06-06-1941 MRN: [id number]   Dear Dr. Thompson Grayer,    We have scheduled the above patient for an endoscopic procedure. Our records show that he is on anticoagulation therapy.   Please advise as to how long the patient may come off his therapy of Xarelto prior to the procedure, which is scheduled for 12-29-2017.  Please route the Xarelto clearance instructions to [facility] CMA.  Sincerely,    Amy Esterwood PA-C

## 2017-11-25 NOTE — Patient Instructions (Signed)
We will call you with Dr.Allred's Xarelto instructions.  You have been scheduled for a colonoscopy. Please follow written instructions given to you at your visit today.  We have provided you with a sample prep. If you use inhalers (even only as needed), please bring them with you on the day of your procedure. Your physician has requested that you go to www.startemmi.com and enter the access code given to you at your visit today. This web site gives a general overview about your procedure. However, you should still follow specific instructions given to you by our office regarding your preparation for the procedure.  If you are age 70 or older, your body mass index should be between 23-30. Your Body mass index is 29.21 kg/m. If this is out of the aforementioned range listed, please consider follow up with your Primary Care Provider.

## 2017-11-26 NOTE — Progress Notes (Signed)
Agree with assessment and plan. Would hold Xarelto 2 days prior to the procedure.

## 2017-12-01 NOTE — Telephone Encounter (Signed)
LM for the patient to please call me.

## 2017-12-03 NOTE — Telephone Encounter (Signed)
Left another message on the patient's voice mail, today 12-03-2017. He can hold the Xarelto on 2-12 and 2-13 and resume the Xarelto after the procedure.  I asked him to call me if he has any questions.

## 2017-12-06 ENCOUNTER — Encounter: Payer: Self-pay | Admitting: Internal Medicine

## 2017-12-06 ENCOUNTER — Ambulatory Visit (INDEPENDENT_AMBULATORY_CARE_PROVIDER_SITE_OTHER): Payer: Medicare Other | Admitting: Internal Medicine

## 2017-12-06 VITALS — BP 124/80 | HR 67 | Ht 69.0 in | Wt 197.0 lb

## 2017-12-06 DIAGNOSIS — I480 Paroxysmal atrial fibrillation: Secondary | ICD-10-CM

## 2017-12-06 DIAGNOSIS — I495 Sick sinus syndrome: Secondary | ICD-10-CM | POA: Diagnosis not present

## 2017-12-06 NOTE — Progress Notes (Signed)
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 male who presents today for routine electrophysiology followup.  Since last being seen in our clinic, the patient reports doing very well.  Today, he denies symptoms of palpitations, chest pain, shortness of breath,  lower extremity edema, dizziness, presyncope, or syncope. Still enjoys salt water fishing.  The patient is otherwise without complaint today.   Past Medical History:  Diagnosis Date  . Abdominal pain, epigastric   . Allergic rhinitis, cause unspecified   . Arthritis   . BPH (benign prostatic hyperplasia)   . CHF (congestive heart failure) ([place])   . Chronic anticoagulation    xarelto  . Coronary artery calcification seen on CAT scan 12/26/2012  . Depressive disorder, not elsewhere classified   . Diverticulosis of colon (without mention of hemorrhage)   . Esophageal reflux   . High risk medication use   . Hyperlipidemia   . Hyperplastic rectal polyp   . Hypertension   . Lumbago   . Pacemaker   . Paralyzed hemidiaphragm    LEFT  . Persistent atrial fibrillation ([place])   . Sick sinus syndrome [facility]) July 2010   s/p PPM [facility]) by [name]   Past Surgical History:  Procedure Laterality Date  . ESOPHAGOGASTRODUODENOSCOPY N/A 12/05/2013   Procedure: ESOPHAGOGASTRODUODENOSCOPY (EGD);  Surgeon: [name], MD;  Location: [facility];  Service: Endoscopy;  Laterality: N/A;  . ESOPHAGOGASTRODUODENOSCOPY N/A 07/04/2015   Procedure: ESOPHAGOGASTRODUODENOSCOPY (EGD);  Surgeon: [name], MD;  Location: [facility];  Service: Endoscopy;  Laterality: N/A;  . INSERT / REPLACE / REMOVE PACEMAKER  05/2009    [employer] ALTRUA 60  (serial number [id number]) dual-chamber pacemaker  . PACEMAKER PLACEMENT Bilateral    '05    ROS- all systems are reviewed and negative except as per HPI above  Current Outpatient Medications  Medication Sig Dispense Refill  . acetaminophen (TYLENOL  ARTHRITIS PAIN) 650 MG CR tablet Take 650 mg by mouth every 8 (eight) hours as needed for pain.    Marland Kitchen amLODipine (NORVASC) 5 MG tablet Take 1 tablet (5 mg total) by mouth daily. 90 tablet 3  . atorvastatin (LIPITOR) 80 MG tablet Take 1 tablet (80 mg total) by mouth daily. 90 tablet 3  . diclofenac sodium (VOLTAREN) 1 % GEL Apply 4 g topically 4 (four) times daily as needed. 400 g 5  . fexofenadine (ALLEGRA) 180 MG tablet TAKE 1 TABLET (180 MG TOTAL) BY MOUTH DAILY. 90 tablet 1  . flecainide (TAMBOCOR) 100 MG tablet TAKE 1 TABLET BY MOUTH TWICE A DAY 180 tablet 3  . latanoprost (XALATAN) 0.005 % ophthalmic solution Place 1 drop into both eyes daily.    Marland Kitchen losartan (COZAAR) 25 MG tablet Take 1 tablet (25 mg total) by mouth daily. 90 tablet 3  . omeprazole (PRILOSEC) 40 MG capsule TAKE 1 CAPSULE (40 MG TOTAL) BY MOUTH DAILY. 90 capsule 1  . oxybutynin (DITROPAN-XL) 10 MG 24 hr tablet TAKE 1 TABLET (10 MG TOTAL) BY MOUTH DAILY. 90 tablet 1  . tamsulosin (FLOMAX) 0.4 MG CAPS capsule TAKE ONE CAPSULE BY MOUTH TWICE A DAY 60 capsule 11  . traMADol (ULTRAM) 50 MG tablet TAKE 1 TABLET BY MOUTH EVERY 6 HOURS AS NEEDED FOR PAIN 120 tablet 5  . XARELTO 20 MG TABS tablet TAKE 1 TABLET BY MOUTH EVERY DAY 90 tablet 0   No current facility-administered medications for this visit.  Physical Exam: Vitals:   12/06/17 0804  BP: 124/80  Pulse: 67  Weight: 197 lb (89.4 kg)  Height: 5\' 9"  (1.753 m)    GEN- The patient is well appearing, alert and oriented x 3 today.   Head- normocephalic, atraumatic Eyes-  Sclera clear, conjunctiva pink Ears- hearing intact Oropharynx- clear Lungs- Clear to ausculation bilaterally, normal work of breathing Chest- pacemaker pocket is well healed Heart- Regular rate and rhythm, no murmurs, rubs or gallops, PMI not laterally displaced GI- soft, NT, ND, + BS Extremities- no clubbing, cyanosis, or edema  Pacemaker interrogation- reviewed in detail today,  See PACEART  report  ekg tracing ordered today is personally reviewed and shows atrial paced rhythm  Assessment and Plan:  1. Symptomatic sinus bradycardia  Normal pacemaker function See Pace Art report No changes today  2. afib Well controlled with flecainide On xarelto Labs from 05/03/17 reviewed  3. HTN Stable No change required today  Return to see device clinic in 6 months EP NP to see in a year  Thompson Grayer MD, [facility] 12/06/2017 8:20 AM

## 2017-12-06 NOTE — Patient Instructions (Signed)
Medication Instructions:  Your physician recommends that you continue on your current medications as directed. Please refer to the Current Medication list given to you today.   Labwork: None ordered   Testing/Procedures: None ordered   Follow-Up: Your physician wants you to follow-up in: 6 months with the device clinic and 12 months with Chanetta Marshall, NP You will receive a reminder letter in the mail two months in advance. If you don't receive a letter, please call our office to schedule the follow-up appointment.   Any Other Special Instructions Will Be Listed Below (If Applicable).     If you need a refill on your cardiac medications before your next appointment, please call your pharmacy.

## 2017-12-07 DIAGNOSIS — R3121 Asymptomatic microscopic hematuria: Secondary | ICD-10-CM | POA: Diagnosis not present

## 2017-12-07 DIAGNOSIS — R351 Nocturia: Secondary | ICD-10-CM | POA: Diagnosis not present

## 2017-12-10 LAB — CUP PACEART INCLINIC DEVICE CHECK
Implantable Lead Implant Date: 20100719
Implantable Lead Location: 753859
Implantable Lead Model: 4136
Implantable Lead Model: 4137
Implantable Lead Serial Number: 28454141
Implantable Pulse Generator Implant Date: 20100719
Lead Channel Impedance Value: 560 Ohm
Lead Channel Pacing Threshold Amplitude: 0.8 V
Lead Channel Pacing Threshold Amplitude: 1.4 V
Lead Channel Pacing Threshold Amplitude: 1.5 V
Lead Channel Pacing Threshold Amplitude: 1.6 V
Lead Channel Pacing Threshold Pulse Width: 1 ms
Lead Channel Pacing Threshold Pulse Width: 1 ms
Lead Channel Sensing Intrinsic Amplitude: 4.5 mV
Lead Channel Setting Pacing Amplitude: 2 V
Lead Channel Setting Pacing Pulse Width: 1 ms
MDC IDC LEAD IMPLANT DT: 20100719
MDC IDC LEAD LOCATION: 753860
MDC IDC LEAD SERIAL: 28594836
MDC IDC MSMT LEADCHNL RA PACING THRESHOLD PULSEWIDTH: 0.4 ms
MDC IDC MSMT LEADCHNL RV IMPEDANCE VALUE: 670 Ohm
MDC IDC MSMT LEADCHNL RV PACING THRESHOLD PULSEWIDTH: 1 ms
MDC IDC MSMT LEADCHNL RV SENSING INTR AMPL: 8.2 mV
MDC IDC SESS DTM: 20190121050000
MDC IDC SET LEADCHNL RV PACING AMPLITUDE: 2.8 V
MDC IDC SET LEADCHNL RV SENSING SENSITIVITY: 2.5 mV
MDC IDC STAT BRADY RA PERCENT PACED: 88 %
MDC IDC STAT BRADY RV PERCENT PACED: 18 %
Pulse Gen Serial Number: 764370

## 2017-12-15 NOTE — Progress Notes (Addendum)
Subjective:   [name] is a 76 y.o. male who presents for Medicare Annual/Subsequent preventive examination.  Review of Systems:  No ROS.  Medicare Wellness Visit. Additional risk factors are reflected in the social history.  Cardiac Risk Factors include: advanced age (>20men, >63 women);dyslipidemia;hypertension;male gender Sleep patterns:  gets up 2-3 times nightly to void and sleeps 5-6 hours nightly. Patient reports insomnia issues, discussed recommended sleep tips and stress reduction tips, education was attached to patient's AVS.  Home Safety/Smoke Alarms: Feels safe in home. Smoke alarms in place.  Living environment; residence and Firearm Safety: 2-story house, no firearms.Lives wife, no needs for DME, good support system Seat Belt Safety/Bike Helmet: Wears seat belt.   PSA-  Lab Results  Component Value Date   PSA 1.91 04/20/2017   PSA 2.05 11/18/2016   PSA 1.51 04/28/2016       Objective:    Vitals: BP (!) 144/88   Pulse 65   Resp 18   Ht 5\' 9"  (1.753 m)   Wt 193 lb (87.5 kg)   SpO2 99%   BMI 28.50 kg/m   Body mass index is 28.5 kg/m.  Advanced Directives 12/16/2017 05/03/2017 11/12/2016 05/10/2016 06/30/2015 06/30/2015 12/05/2013  Does Patient Have a Medical Advance Directive? Yes No No No No No Patient has advance directive, copy in chart  Type of Advance Directive [place];Living will - - - - - Living will  Copy of [place] in Chart? No - copy requested - - - - - -  Would patient like information on creating a medical advance directive? - - Yes (MAU/Ambulatory/Procedural Areas - Information given) No - patient declined information No - patient declined information No - patient declined information -    Tobacco Social History   Tobacco Use  Smoking Status Former Smoker  . Packs/day: 0.50  . Years: 25.00  . Pack years: 12.50  . Types: Cigarettes  . Last attempt to quit: 11/16/1978  . Years since quitting: 39.1    Smokeless Tobacco Former Systems developer  . Types: Chew     Counseling given: Not Answered   Past Medical History:  Diagnosis Date  . Abdominal pain, epigastric   . Allergic rhinitis, cause unspecified   . Arthritis   . BPH (benign prostatic hyperplasia)   . CHF (congestive heart failure) ([place])   . Chronic anticoagulation    xarelto  . Coronary artery calcification seen on CAT scan 12/26/2012  . Depressive disorder, not elsewhere classified   . Diverticulosis of colon (without mention of hemorrhage)   . Esophageal reflux   . High risk medication use   . Hyperlipidemia   . Hyperplastic rectal polyp   . Hypertension   . Lumbago   . Pacemaker   . Paralyzed hemidiaphragm    LEFT  . Persistent atrial fibrillation ([place])   . Sick sinus syndrome [facility]) July 2010   s/p [facility]) by [name]   Past Surgical History:  Procedure Laterality Date  . ESOPHAGOGASTRODUODENOSCOPY N/A 12/05/2013   Procedure: ESOPHAGOGASTRODUODENOSCOPY (EGD);  Surgeon: [name], MD;  Location: [facility];  Service: Endoscopy;  Laterality: N/A;  . ESOPHAGOGASTRODUODENOSCOPY N/A 07/04/2015   Procedure: ESOPHAGOGASTRODUODENOSCOPY (EGD);  Surgeon: [name], MD;  Location: [facility];  Service: Endoscopy;  Laterality: N/A;  . INSERT / REPLACE / REMOVE PACEMAKER  05/2009    [employer] ALTRUA 60  (serial number [id number]) dual-chamber pacemaker  . PACEMAKER PLACEMENT Bilateral    '05  Family History  Problem Relation Age of Onset  . Diabetes Mother   . Hypertension Mother   . Hypertension Father   . Lung cancer Father   . Cancer Father   . Asthma Sister   . Hypertension Sister   . Arthritis Paternal Grandmother   . Hypertension Brother   . Hypertension Daughter   . Hypertension Son    Social History   Socioeconomic History  . Marital status: Married    Spouse name: None  . Number of children: None  . Years of education: None  . Highest education level: None  Social Needs  .  Financial resource strain: Not hard at all  . Food insecurity - worry: Never true  . Food insecurity - inability: Never true  . Transportation needs - medical: No  . Transportation needs - non-medical: No  Occupational History  . Occupation: retired    Fish farm manager: RETIRED  Tobacco Use  . Smoking status: Former Smoker    Packs/day: 0.50    Years: 25.00    Pack years: 12.50    Types: Cigarettes    Last attempt to quit: 11/16/1978    Years since quitting: 39.1  . Smokeless tobacco: Former Systems developer    Types: Chew  Substance and Sexual Activity  . Alcohol use: No  . Drug use: No  . Sexual activity: No  Other Topics Concern  . 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     Outpatient Encounter Medications as of 12/16/2017  Medication Sig  . acetaminophen (TYLENOL ARTHRITIS PAIN) 650 MG CR tablet Take 650 mg by mouth every 8 (eight) hours as needed for pain.  Marland Kitchen amLODipine (NORVASC) 5 MG tablet Take 1 tablet (5 mg total) by mouth daily.  Marland Kitchen atorvastatin (LIPITOR) 80 MG tablet Take 1 tablet (80 mg total) by mouth daily.  . diclofenac sodium (VOLTAREN) 1 % GEL Apply 4 g topically 4 (four) times daily as needed.  . fexofenadine (ALLEGRA) 180 MG tablet TAKE 1 TABLET (180 MG TOTAL) BY MOUTH DAILY.  . flecainide (TAMBOCOR) 100 MG tablet TAKE 1 TABLET BY MOUTH TWICE A DAY  . latanoprost (XALATAN) 0.005 % ophthalmic solution Place 1 drop into both eyes daily.  Marland Kitchen losartan (COZAAR) 25 MG tablet Take 1 tablet (25 mg total) by mouth daily.  Marland Kitchen omeprazole (PRILOSEC) 40 MG capsule TAKE 1 CAPSULE (40 MG TOTAL) BY MOUTH DAILY.  Marland Kitchen oxybutynin (DITROPAN-XL) 10 MG 24 hr tablet TAKE 1 TABLET (10 MG TOTAL) BY MOUTH DAILY.  . tamsulosin (FLOMAX) 0.4 MG CAPS capsule TAKE ONE CAPSULE BY MOUTH TWICE A DAY  . traMADol (ULTRAM) 50 MG tablet TAKE 1 TABLET BY MOUTH EVERY 6 HOURS AS NEEDED FOR PAIN  . XARELTO 20 MG TABS tablet TAKE 1 TABLET BY MOUTH EVERY DAY  . [DISCONTINUED] furosemide (LASIX) 40 MG  tablet Take 1 tablet (40 mg total) by mouth daily.   No facility-administered encounter medications on file as of 12/16/2017.     Activities of Daily Living In your present state of health, do you have any difficulty performing the following activities: 12/16/2017  Hearing? N  Vision? N  Difficulty concentrating or making decisions? N  Walking or climbing stairs? N  Dressing or bathing? N  Doing errands, shopping? N  Preparing Food and eating ? N  Using the Toilet? N  In the past six months, have you accidently leaked urine? N  Comment urologist Dr. Karsten Ro  Do you have problems  with loss of bowel control? N  Managing your Medications? N  Managing your Finances? N  Housekeeping or managing your Housekeeping? N  Some recent data might be hidden    Patient Care Team: Biagio Borg, MD as PCP - Bethena Roys, MD as Attending Physician (Cardiology)   Assessment:   This is a routine wellness examination for Rondy. Physical assessment deferred to PCP.   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  Goals    . Patient Stated     Monitor my diet to decrease sugars, carbohydrates, and continue to not use a lot of salt. Start to drink more water daily. Go fishing a much as possible, enjoy life and family.       Fall Risk Fall Risk  12/16/2017 11/01/2017 04/20/2017 11/12/2016 04/28/2016  Falls in the past year? No No No No No    Depression Screen PHQ 2/9 Scores 12/16/2017  11/01/2017 11/12/2016 04/28/2016  PHQ - 2 Score 2 0 0 0  PHQ- 9 Score 6 - - -    Cognitive Function MMSE - Mini Mental State Exam 12/16/2017  Orientation to time 5  Orientation to Place 5  Registration 3  Attention/ Calculation 5  Recall 2  Language- name 2 objects 2  Language- repeat 1  Language- follow 3 step command 3  Language- read & follow direction 1  Write a sentence 1  Copy design 1  Total score 29        Immunization History  Administered Date(s) Administered  . Influenza Split 08/16/2012, 08/17/2012, 08/16/2013  . Influenza Whole 08/16/2010, 08/17/2011  . Influenza-Unspecified 08/11/2015, 07/17/2016  . Pneumococcal Conjugate-13 09/26/2013  . Pneumococcal Polysaccharide-23 10/25/2006, 09/16/2010  . Td 05/27/2009  . Zoster 10/25/2006   Screening Tests Health Maintenance  Topic Date Due  . COLONOSCOPY  11/28/2017  . TETANUS/TDAP  05/28/2019  . INFLUENZA VACCINE  Completed  . PNA vac Low Risk Adult  Completed      Plan:     Patient scheduled for colonoscopy 12/29/17  Continue to eat heart healthy diet (full of fruits, vegetables, whole grains, lean protein, water--limit salt, fat, and sugar intake) and increase physical activity as tolerated.  Continue doing brain stimulating activities (puzzles, reading, adult coloring books, staying active) to keep memory sharp.   I have personally reviewed and noted the following in the patient's chart:   . Medical and social history . Use of alcohol, tobacco or illicit drugs  . Current medications and supplements . Functional ability and status . Nutritional status . Physical activity . Advanced directives . List of other physicians . Vitals . Screenings to include cognitive, depression, and falls . Referrals and appointments  In addition, I have reviewed and discussed with patient certain preventive protocols, quality metrics, and best practice recommendations. A written personalized care plan for preventive  services as well as general preventive health recommendations were provided to patient.     Michiel Cowboy, RN  12/16/2017  Medical screening examination/treatment/procedure(s) were performed by non-physician practitioner and as supervising physician I was immediately available for consultation/collaboration. I agree with above. Jeneen Rinks  Jenny Reichmann, MD

## 2017-12-16 ENCOUNTER — Ambulatory Visit (INDEPENDENT_AMBULATORY_CARE_PROVIDER_SITE_OTHER): Payer: Medicare Other | Admitting: *Deleted

## 2017-12-16 VITALS — BP 144/88 | HR 65 | Resp 18 | Ht 69.0 in | Wt 193.0 lb

## 2017-12-16 DIAGNOSIS — Z0000 Encounter for general adult medical examination without abnormal findings: Secondary | ICD-10-CM | POA: Diagnosis not present

## 2017-12-16 NOTE — Patient Instructions (Addendum)
Continue to eat heart healthy diet (full of fruits, vegetables, whole grains, lean protein, water--limit salt, fat, and sugar intake) and increase physical activity as tolerated.  Continue doing brain stimulating activities (puzzles, reading, adult coloring books, staying active) to keep memory sharp.   [name] , Thank you for taking time to come for your Medicare Wellness Visit. I appreciate your ongoing commitment to your health goals. Please review the following plan we discussed and let me know if I can assist you in the future.   These are the goals we discussed: Goals    . Patient Stated     Monitor my diet to decrease sugars, carbohydrates, and continue to not use a lot of salt. Start to drink more water daily. Go fishing a much as possible, enjoy life and family.       This is a list of the screening recommended for you and due dates:  Health Maintenance  Topic Date Due  . Colon Cancer Screening  11/28/2017  . Tetanus Vaccine  05/28/2019  . Flu Shot  Completed  . Pneumonia vaccines  Completed     Insomnia Insomnia is a sleep disorder that makes it difficult to fall asleep or to stay asleep. Insomnia can cause tiredness (fatigue), low energy, difficulty concentrating, mood swings, and poor performance at work or school. There are three different ways to classify insomnia:  Difficulty falling asleep.  Difficulty staying asleep.  Waking up too early in the morning.  Any type of insomnia can be long-term (chronic) or short-term (acute). Both are common. Short-term insomnia usually lasts for three months or less. Chronic insomnia occurs at least three times a week for longer than three months. What are the causes? Insomnia may be caused by another condition, situation, or substance, such as:  Anxiety.  Certain medicines.  Gastroesophageal reflux disease (GERD) or other gastrointestinal conditions.  Asthma or other breathing conditions.  Restless legs syndrome, sleep  apnea, or other sleep disorders.  Chronic pain.  Menopause. This may include hot flashes.  Stroke.  Abuse of alcohol, tobacco, or illegal drugs.  Depression.  Caffeine.  Neurological disorders, such as Alzheimer disease.  An overactive thyroid (hyperthyroidism).  The cause of insomnia may not be known. What increases the risk? Risk factors for insomnia include:  Gender. Women are more commonly affected than men.  Age. Insomnia is more common as you get older.  Stress. This may involve your professional or personal life.  Income. Insomnia is more common in people with lower income.  Lack of exercise.  Irregular work schedule or night shifts.  Traveling between different time zones.  What are the signs or symptoms? If you have insomnia, trouble falling asleep or trouble staying asleep is the main symptom. This may lead to other symptoms, such as:  Feeling fatigued.  Feeling nervous about going to sleep.  Not feeling rested in the morning.  Having trouble concentrating.  Feeling irritable, anxious, or depressed.  How is this treated? Treatment for insomnia depends on the cause. If your insomnia is caused by an underlying condition, treatment will focus on addressing the condition. Treatment may also include:  Medicines to help you sleep.  Counseling or therapy.  Lifestyle adjustments.  Follow these instructions at home:  Take medicines only as directed by your health care provider.  Keep regular sleeping and waking hours. Avoid naps.  Keep a sleep diary to help you and your health care provider figure out what could be causing your insomnia. Include: ?  When you sleep. ? When you wake up during the night. ? How well you sleep. ? How rested you feel the next day. ? Any side effects of medicines you are taking. ? What you eat and drink.  Make your bedroom a comfortable place where it is easy to fall asleep: ? Put up shades or special blackout  curtains to block light from outside. ? Use a white noise machine to block noise. ? Keep the temperature cool.  Exercise regularly as directed by your health care provider. Avoid exercising right before bedtime.  Use relaxation techniques to manage stress. Ask your health care provider to suggest some techniques that may work well for you. These may include: ? Breathing exercises. ? Routines to release muscle tension. ? Visualizing peaceful scenes.  Cut back on alcohol, caffeinated beverages, and cigarettes, especially close to bedtime. These can disrupt your sleep.  Do not overeat or eat spicy foods right before bedtime. This can lead to digestive discomfort that can make it hard for you to sleep.  Limit screen use before bedtime. This includes: ? Watching TV. ? Using your smartphone, tablet, and computer.  Stick to a routine. This can help you fall asleep faster. Try to do a quiet activity, brush your teeth, and go to bed at the same time each night.  Get out of bed if you are still awake after 15 minutes of trying to sleep. Keep the lights down, but try reading or doing a quiet activity. When you feel sleepy, go back to bed.  Make sure that you drive carefully. Avoid driving if you feel very sleepy.  Keep all follow-up appointments as directed by your health care provider. This is important. Contact a health care provider if:  You are tired throughout the day or have trouble in your daily routine due to sleepiness.  You continue to have sleep problems or your sleep problems get worse. Get help right away if:  You have serious thoughts about hurting yourself or someone else. This information is not intended to replace advice given to you by your health care provider. Make sure you discuss any questions you have with your health care provider. Document Released: 10/30/2000 Document Revised: 04/03/2016 Document Reviewed: 08/03/2014 Elsevier Interactive Patient Education  2018  [place] oils 100% pure to help sleep and/or Melatonin this is over the counter. Talk to daughter who uses doterra oils.

## 2017-12-21 NOTE — Telephone Encounter (Signed)
I left another message with the patient's wife Stanton Kidney. I advised her husband can hold the Xarelto on 2-12 and 2-13. He is to resume after the procedure  Stanton Kidney said she will give this message to her husband.  She verbalized understanding the instructions.

## 2017-12-23 ENCOUNTER — Other Ambulatory Visit: Payer: Self-pay | Admitting: Internal Medicine

## 2017-12-24 ENCOUNTER — Other Ambulatory Visit: Payer: Self-pay | Admitting: Internal Medicine

## 2017-12-29 ENCOUNTER — Encounter: Payer: Self-pay | Admitting: Gastroenterology

## 2017-12-29 ENCOUNTER — Other Ambulatory Visit: Payer: Self-pay

## 2017-12-29 ENCOUNTER — Ambulatory Visit (AMBULATORY_SURGERY_CENTER): Payer: Medicare Other | Admitting: Gastroenterology

## 2017-12-29 VITALS — BP 134/86 | HR 79 | Temp 97.8°F | Resp 13 | Ht 69.0 in | Wt 197.0 lb

## 2017-12-29 DIAGNOSIS — D123 Benign neoplasm of transverse colon: Secondary | ICD-10-CM

## 2017-12-29 DIAGNOSIS — Z1211 Encounter for screening for malignant neoplasm of colon: Secondary | ICD-10-CM | POA: Diagnosis not present

## 2017-12-29 DIAGNOSIS — D127 Benign neoplasm of rectosigmoid junction: Secondary | ICD-10-CM

## 2017-12-29 MED ORDER — SODIUM CHLORIDE 0.9 % IV SOLN: 500.0000 mL | Freq: Once | INTRAVENOUS | Status: DC

## 2017-12-29 NOTE — Progress Notes (Signed)
A/ox3 pleased with MAC, report to [employer]

## 2017-12-29 NOTE — Op Note (Signed)
[place] Patient Name: [name] Procedure Date: 12/29/2017 1:34 PM MRN: [id number] Endoscopist: Remo Lipps P. Hamed Debella MD, MD Age: 77 Referring MD:  Date of Birth: 08/26/1941 Gender: Male Account #: [id number] Procedure:                Colonoscopy Indications:              Screening for colorectal malignant neoplasm Medicines:                Monitored Anesthesia Care Procedure:                Pre-Anesthesia Assessment:                           - Prior to the procedure, a History and Physical                            was performed, and patient medications and                            allergies were reviewed. The patient's tolerance of                            previous anesthesia was also reviewed. The risks                            and benefits of the procedure and the sedation                            options and risks were discussed with the patient.                            All questions were answered, and informed consent                            was obtained. Prior Anticoagulants: The patient has                            taken Xarelto (rivaroxaban), last dose was 2 days                            prior to procedure. ASA Grade Assessment: II - A                            patient with mild systemic disease. After reviewing                            the risks and benefits, the patient was deemed in                            satisfactory condition to undergo the procedure.                           After obtaining informed consent, the colonoscope  was passed under direct vision. Throughout the                            procedure, the patient's blood pressure, pulse, and                            oxygen saturations were monitored continuously. The                            Colonoscope was introduced through the anus and                            advanced to the the cecum, identified by                            appendiceal  orifice and ileocecal valve. The                            colonoscopy was performed without difficulty. The                            patient tolerated the procedure well. The quality                            of the bowel preparation was good. The ileocecal                            valve, appendiceal orifice, and rectum were                            photographed. Scope In: 1:45:05 PM Scope Out: 2:04:21 PM Scope Withdrawal Time: 0 hours 16 minutes 18 seconds  Total Procedure Duration: 0 hours 19 minutes 16 seconds  Findings:                 The perianal and digital rectal examinations were                            normal.                           Many medium-mouthed diverticula were found in the                            entire colon.                           Three sessile polyps were found in the transverse                            colon. The polyps were 4 to 6 mm in size. These                            polyps were removed with a cold snare. Resection  and retrieval were complete.                           A 4 mm polyp was found in the recto-sigmoid colon.                            The polyp was sessile. The polyp was removed with a                            cold snare. Resection and retrieval were complete.                           Internal hemorrhoids were found during retroflexion.                           The exam was otherwise without abnormality. Complications:            No immediate complications. Estimated blood loss:                            Minimal. Estimated Blood Loss:     Estimated blood loss was minimal. Impression:               - Diverticulosis in the entire examined colon.                           - Three 4 to 6 mm polyps in the transverse colon,                            removed with a cold snare. Resected and retrieved.                           - One 4 mm polyp at the recto-sigmoid colon,                             removed with a cold snare. Resected and retrieved.                           - Internal hemorrhoids.                           - The examination was otherwise normal. Recommendation:           - Patient has a contact number available for                            emergencies. The signs and symptoms of potential                            delayed complications were discussed with the                            patient. Return to normal activities tomorrow.  Written discharge instructions were provided to the                            patient.                           - Resume previous diet.                           - Continue present medications.                           - Resume Xarelto Friday morning                           - Await pathology results.                           - Repeat colonoscopy is recommended for                            surveillance. The colonoscopy date will be                            determined after pathology results from today's                            exam become available for review.                           - No ibuprofen, naproxen, or other non-steroidal                            anti-inflammatory drugs for 2 weeks after polyp                            removal. Remo Lipps P. Thana Ramp MD, MD 12/29/2017 2:10:03 PM This report has been signed electronically.

## 2017-12-29 NOTE — Progress Notes (Signed)
Pt's states no medical or surgical changes since previsit or office visit. 

## 2017-12-29 NOTE — Patient Instructions (Signed)
YOU HAD AN ENDOSCOPIC PROCEDURE TODAY AT [facility]:   Refer to the procedure report that was given to you for any specific questions about what was found during the examination.  If the procedure report does not answer your questions, please call your gastroenterologist to clarify.  If you requested that your care partner not be given the details of your procedure findings, then the procedure report has been included in a sealed envelope for you to review at your convenience later.  YOU SHOULD EXPECT: Some feelings of bloating in the abdomen. Passage of more gas than usual.  Walking can help get rid of the air that was put into your GI tract during the procedure and reduce the bloating. If you had a lower endoscopy (such as a colonoscopy or flexible sigmoidoscopy) you may notice spotting of blood in your stool or on the toilet paper. If you underwent a bowel prep for your procedure, you may not have a normal bowel movement for a few days.  Please Note:  You might notice some irritation and congestion in your nose or some drainage.  This is from the oxygen used during your procedure.  There is no need for concern and it should clear up in a day or so.  SYMPTOMS TO REPORT IMMEDIATELY:   Following lower endoscopy (colonoscopy or flexible sigmoidoscopy):  Excessive amounts of blood in the stool  Significant tenderness or worsening of abdominal pains  Swelling of the abdomen that is new, acute  Fever of 100F or higher  Please see handouts on polyps, diverticulosis, and hemrrhoids.  For urgent or emergent issues, a gastroenterologist can be reached at any hour by calling [phone].   DIET:  We do recommend a small meal at first, but then you may proceed to your regular diet.  Drink plenty of fluids but you should avoid alcoholic beverages for 24 hours.  ACTIVITY:  You should plan to take it easy for the rest of today and you should NOT DRIVE or use heavy machinery until  tomorrow (because of the sedation medicines used during the test).    FOLLOW UP: Our staff will call the number listed on your records the next business day following your procedure to check on you and address any questions or concerns that you may have regarding the information given to you following your procedure. If we do not reach you, we will leave a message.  However, if you are feeling well and you are not experiencing any problems, there is no need to return our call.  We will assume that you have returned to your regular daily activities without incident.  If any biopsies were taken you will be contacted by phone or by letter within the next 1-3 weeks.  Please call us at [phone] if you have not heard about the biopsies in 3 weeks.    SIGNATURES/CONFIDENTIALITY: You and/or your care partner have signed paperwork which will be entered into your electronic medical record.  These signatures attest to the fact that that the information above on your After Visit Summary has been reviewed and is understood.  Full responsibility of the confidentiality of this discharge information lies with you and/or your care-partner.   Thank you for allowing [place] to provide your healthcare today.

## 2017-12-29 NOTE — Progress Notes (Signed)
Called to room to assist during endoscopic procedure.  Patient ID and intended procedure confirmed with present staff. Received instructions for my participation in the procedure from the performing physician.  

## 2017-12-30 ENCOUNTER — Telehealth: Payer: Self-pay | Admitting: *Deleted

## 2017-12-30 NOTE — Telephone Encounter (Signed)
  Follow up Call-  Call back number 12/29/2017  Post procedure Call Back phone  # [phone]  Permission to leave phone message Yes  Some recent data might be hidden     Patient questions:  Do you have a fever, pain , or abdominal swelling? No. Pain Score  0 *  Have you tolerated food without any problems? Yes.    Have you been able to return to your normal activities? Yes.    Do you have any questions about your discharge instructions: Diet   No. Medications  No. Follow up visit  No.  Do you have questions or concerns about your Care? No.  Actions: * If pain score is 4 or above: No action needed, pain <4.

## 2018-01-02 ENCOUNTER — Other Ambulatory Visit: Payer: Self-pay | Admitting: Internal Medicine

## 2018-01-04 ENCOUNTER — Encounter: Payer: Self-pay | Admitting: Gastroenterology

## 2018-01-08 ENCOUNTER — Other Ambulatory Visit: Payer: Self-pay | Admitting: Internal Medicine

## 2018-01-19 ENCOUNTER — Other Ambulatory Visit: Payer: Self-pay | Admitting: Internal Medicine

## 2018-01-24 DIAGNOSIS — H2511 Age-related nuclear cataract, right eye: Secondary | ICD-10-CM | POA: Diagnosis not present

## 2018-01-24 DIAGNOSIS — H401131 Primary open-angle glaucoma, bilateral, mild stage: Secondary | ICD-10-CM | POA: Diagnosis not present

## 2018-01-24 DIAGNOSIS — H25812 Combined forms of age-related cataract, left eye: Secondary | ICD-10-CM | POA: Diagnosis not present

## 2018-02-10 ENCOUNTER — Encounter: Payer: Self-pay | Admitting: Family Medicine

## 2018-02-10 ENCOUNTER — Ambulatory Visit (INDEPENDENT_AMBULATORY_CARE_PROVIDER_SITE_OTHER): Payer: Medicare Other | Admitting: Family Medicine

## 2018-02-10 VITALS — BP 136/80 | HR 98 | Ht 69.0 in | Wt 186.0 lb

## 2018-02-10 DIAGNOSIS — S39012A Strain of muscle, fascia and tendon of lower back, initial encounter: Secondary | ICD-10-CM | POA: Diagnosis not present

## 2018-02-10 MED ORDER — METHOCARBAMOL 500 MG PO TABS: 500.0000 mg | ORAL_TABLET | Freq: Three times a day (TID) | ORAL | 1 refills | Status: DC

## 2018-02-10 NOTE — Patient Instructions (Signed)
Low Back Strain Rehab  Ask your health care provider which exercises are safe for you. Do exercises exactly as told by your health care provider and adjust them as directed. It is normal to feel mild stretching, pulling, tightness, or discomfort as you do these exercises, but you should stop right away if you feel sudden pain or your pain gets worse. Do not begin these exercises until told by your health care provider.  Stretching and range of motion exercises  These exercises warm up your muscles and joints and improve the movement and flexibility of your back. These exercises also help to relieve pain, numbness, and tingling.  Exercise A: Single knee to chest    1. Lie on your back on a firm surface with both legs straight.  2. Bend one of your knees. Use your hands to move your knee up toward your chest until you feel a gentle stretch in your lower back and buttock.  ? Hold your leg in this position by holding onto the front of your knee.  ? Keep your other leg as straight as possible.  3. Hold for __________ seconds.  4. Slowly return to the starting position.  5. Repeat with your other leg.  Repeat __________ times. Complete this exercise __________ times a day.  Exercise B: Prone extension on elbows    1. Lie on your abdomen on a firm surface.  2. Prop yourself up on your elbows.  3. Use your arms to help lift your chest up until you feel a gentle stretch in your abdomen and your lower back.  ? This will place some of your body weight on your elbows. If this is uncomfortable, try stacking pillows under your chest.  ? Your hips should stay down, against the surface that you are lying on. Keep your hip and back muscles relaxed.  4. Hold for __________ seconds.  5. Slowly relax your upper body and return to the starting position.  Repeat __________ times. Complete this exercise __________ times a day.  Strengthening exercises  These exercises build strength and endurance in your back. Endurance is the ability to  use your muscles for a long time, even after they get tired.  Exercise C: Pelvic tilt  1. Lie on your back on a firm surface. Bend your knees and keep your feet flat.  2. Tense your abdominal muscles. Tip your pelvis up toward the ceiling and flatten your lower back into the floor.  ? To help with this exercise, you may place a small towel under your lower back and try to push your back into the towel.  3. Hold for __________ seconds.  4. Let your muscles relax completely before you repeat this exercise.  Repeat __________ times. Complete this exercise __________ times a day.  Exercise D: Alternating arm and leg raises    1. Get on your hands and knees on a firm surface. If you are on a hard floor, you may want to use padding to cushion your knees, such as an exercise mat.  2. Line up your arms and legs. Your hands should be below your shoulders, and your knees should be below your hips.  3. Lift your left leg behind you. At the same time, raise your right arm and straighten it in front of you.  ? Do not lift your leg higher than your hip.  ? Do not lift your arm higher than your shoulder.  ? Keep your abdominal and back muscles tight.  ?   Keep your hips facing the ground.  ? Do not arch your back.  ? Keep your balance carefully, and do not hold your breath.  4. Hold for __________ seconds.  5. Slowly return to the starting position and repeat with your right leg and your left arm.  Repeat __________ times. Complete this exercise __________times a day.  Exercise J: Single leg lower with bent knees  1. Lie on your back on a firm surface.  2. Tense your abdominal muscles and lift your feet off the floor, one foot at a time, so your knees and hips are bent in an "L" shape (at about 90 degrees).  ? Your knees should be over your hips and your lower legs should be parallel to the floor.  3. Keeping your abdominal muscles tense and your knee bent, slowly lower one of your legs so your toe touches the ground.  4. Lift your  leg back up to return to the starting position.  ? Do not hold your breath.  ? Do not let your back arch. Keep your back flat against the ground.  5. Repeat with your other leg.  Repeat __________ times. Complete this exercise __________ times a day.  Posture and body mechanics    Body mechanics refers to the movements and positions of your body while you do your daily activities. Posture is part of body mechanics. Good posture and healthy body mechanics can help to relieve stress in your body's tissues and joints. Good posture means that your spine is in its natural S-curve position (your spine is neutral), your shoulders are pulled back slightly, and your head is not tipped forward. The following are general guidelines for applying improved posture and body mechanics to your everyday activities.  Standing     When standing, keep your spine neutral and your feet about hip-width apart. Keep a slight bend in your knees. Your ears, shoulders, and hips should line up.   When you do a task in which you stand in one place for a long time, place one foot up on a stable object that is 2-4 inches (5-10 cm) high, such as a footstool. This helps keep your spine neutral.  Sitting     When sitting, keep your spine neutral and keep your feet flat on the floor. Use a footrest, if necessary, and keep your thighs parallel to the floor. Avoid rounding your shoulders, and avoid tilting your head forward.   When working at a desk or a computer, keep your desk at a height where your hands are slightly lower than your elbows. Slide your chair under your desk so you are close enough to maintain good posture.   When working at a computer, place your monitor at a height where you are looking straight ahead and you do not have to tilt your head forward or downward to look at the screen.  Resting     When lying down and resting, avoid positions that are most painful for you.   If you have pain with activities such as sitting, bending,  stooping, or squatting (flexion-based activities), lie in a position in which your body does not bend very much. For example, avoid curling up on your side with your arms and knees near your chest (fetal position).   If you have pain with activities such as standing for a long time or reaching with your arms (extension-based activities), lie with your spine in a neutral position and bend your knees slightly. Try the   following positions:  ? Lying on your side with a pillow between your knees.  ? Lying on your back with a pillow under your knees.  Lifting     When lifting objects, keep your feet at least shoulder-width apart and tighten your abdominal muscles.   Bend your knees and hips and keep your spine neutral. It is important to lift using the strength of your legs, not your back. Do not lock your knees straight out.   Always ask for help to lift heavy or awkward objects.  This information is not intended to replace advice given to you by your health care provider. Make sure you discuss any questions you have with your health care provider.  Document Released: 11/02/2005 Document Revised: 07/09/2016 Document Reviewed: 08/14/2015  Elsevier Interactive Patient Education  2018 [employer]

## 2018-02-10 NOTE — Progress Notes (Signed)
Subjective:  Patient ID: [name], male    DOB: 07-13-1941  Age: 77 y.o. MRN: [id number]  CC: possible pulled muscle   HPI [name] presents for evaluation of lower back pain.  He injured himself 2 days ago when he went to lift up a wheelbarrow by the handles.  He stood up and immediately developed pain in his left lower back that moves into his upper buttock area.  He denies weakness or paresthesias in the left lower extremity.  There is no bowel or bladder dysfunction.  He feels the muscles tightening and there is associated stiffness.  He took 1 of his wife's Robaxin with some relief.  He takes Tylenol arthritis on a regular basis already.  Outpatient Medications Prior to Visit  Medication Sig Dispense Refill  . acetaminophen (TYLENOL ARTHRITIS PAIN) 650 MG CR tablet Take 650 mg by mouth every 8 (eight) hours as needed for pain.    Marland Kitchen amLODipine (NORVASC) 5 MG tablet TAKE 1 TABLET (5 MG TOTAL) BY MOUTH DAILY. 90 tablet 3  . atorvastatin (LIPITOR) 80 MG tablet Take 1 tablet (80 mg total) by mouth daily. 90 tablet 3  . diclofenac sodium (VOLTAREN) 1 % GEL APPLY 4 GRAMS TOPICALLY 4 TIMES A DAY AS NEEDED 400 g 4  . fexofenadine (ALLEGRA) 180 MG tablet TAKE 1 TABLET (180 MG TOTAL) BY MOUTH DAILY. 90 tablet 1  . flecainide (TAMBOCOR) 100 MG tablet TAKE 1 TABLET BY MOUTH TWICE A DAY 180 tablet 3  . latanoprost (XALATAN) 0.005 % ophthalmic solution Place 1 drop into both eyes daily.    Marland Kitchen losartan (COZAAR) 25 MG tablet Take 1 tablet (25 mg total) by mouth daily. 90 tablet 3  . omeprazole (PRILOSEC) 40 MG capsule TAKE 1 CAPSULE (40 MG TOTAL) BY MOUTH DAILY. 90 capsule 1  . oxybutynin (DITROPAN-XL) 10 MG 24 hr tablet TAKE 1 TABLET (10 MG TOTAL) BY MOUTH DAILY. 90 tablet 1  . tamsulosin (FLOMAX) 0.4 MG CAPS capsule TAKE ONE CAPSULE BY MOUTH TWICE A DAY 60 capsule 9  . traMADol (ULTRAM) 50 MG tablet TAKE 1 TABLET BY MOUTH EVERY 6 HOURS AS NEEDED FOR PAIN 120 tablet 5  . XARELTO 20 MG TABS tablet  TAKE 1 TABLET BY MOUTH EVERY DAY 90 tablet 3   Facility-Administered Medications Prior to Visit  Medication Dose Route Frequency Provider Last Rate Last Dose  . 0.9 %  sodium chloride infusion  500 mL Intravenous Once Armbruster, Carlota Raspberry, MD        ROS Review of Systems  Constitutional: Negative.   Respiratory: Negative.   Cardiovascular: Negative.   Gastrointestinal: Negative.   Genitourinary: Negative for decreased urine volume, difficulty urinating and hematuria.  Musculoskeletal: Positive for back pain. Negative for myalgias.  Skin: Negative.   Neurological: Negative for weakness and numbness.  Hematological: Does not bruise/bleed easily.    Objective:  BP 136/80 (BP Location: Left Arm, Patient Position: Sitting, Cuff Size: Normal)   Pulse 98   Ht 5\' 9"  (1.753 m)   Wt 186 lb (84.4 kg)   SpO2 98%   BMI 27.47 kg/m   BP Readings from Last 3 Encounters:  02/10/18 136/80  12/29/17 134/86  12/16/17 (!) 144/88    Wt Readings from Last 3 Encounters:  02/10/18 186 lb (84.4 kg)  12/29/17 197 lb (89.4 kg)  12/16/17 193 lb (87.5 kg)    Physical Exam  Constitutional: He is oriented to person, place, and time. He appears well-developed and  well-nourished. No distress.  HENT:  Head: Normocephalic and atraumatic.  Right Ear: External ear normal.  Left Ear: External ear normal.  Eyes: Right eye exhibits no discharge. Left eye exhibits no discharge. No scleral icterus.  Neck: No JVD present. No tracheal deviation present.  Pulmonary/Chest: Effort normal. No stridor.  Musculoskeletal:       Lumbar back: He exhibits decreased range of motion. He exhibits no tenderness, no bony tenderness, no deformity and no spasm.  Neurological: He is alert and oriented to person, place, and time. He has normal strength.  Skin: Skin is warm and dry. He is not diaphoretic.  Psychiatric: He has a normal mood and affect. His behavior is normal.    Lab Results  Component Value Date   WBC 8.6  05/03/2017   HGB 15.0 05/03/2017   HCT 42.7 05/03/2017   PLT 186 05/03/2017   GLUCOSE 146 (H) 05/03/2017   CHOL 201 (H) 11/18/2016   TRIG 81.0 11/18/2016   HDL 80.40 11/18/2016   LDLDIRECT 183.4 12/26/2013   LDLCALC 105 (H) 11/18/2016   ALT 26 05/03/2017   AST 34 05/03/2017   NA 140 05/03/2017   K 3.7 05/03/2017   CL 107 05/03/2017   CREATININE 0.68 05/03/2017   BUN 13 05/03/2017   CO2 21 (L) 05/03/2017   TSH 0.93 11/18/2016   PSA 1.91 04/20/2017   INR 1.45 07/01/2015   HGBA1C 6.2 11/18/2016    Ct Abdomen Pelvis W Contrast  Result Date: 05/03/2017 CLINICAL DATA:  Generalized abdominal pain for 1 day. Vomiting. Nausea. EXAM: CT ABDOMEN AND PELVIS WITH CONTRAST TECHNIQUE: Multidetector CT imaging of the abdomen and pelvis was performed using the standard protocol following bolus administration of intravenous contrast. CONTRAST:  161mL ISOVUE-300 IOPAMIDOL (ISOVUE-300) INJECTION 61% COMPARISON:  CT 07/01/2015 FINDINGS: Lower chest: Pacemaker is partially included. Mild cardiomegaly. Atelectasis versus scarring at the left lung base with elevated left hemidiaphragm. Hepatobiliary: Scattered calcified granulomas. No focal hepatic lesion. No gallstones, gallbladder wall thickening, or biliary dilatation. Pancreas: Parenchymal atrophy. No ductal dilatation or inflammation. A few tiny calcifications about pancreatic tail, unchanged from prior exam. Spleen: Normal in size without focal abnormality. Adrenals/Urinary Tract: No adrenal nodule. No hydronephrosis. Nonobstructing stones in the left kidney. Mild nonspecific perinephric edema, right greater than left. Homogeneous enhancement with symmetric renal excretion on delayed phase imaging. Urinary bladder is physiologically distended. Mild thickening of the adrenal glands without discrete nodule. Stomach/Bowel: Small hiatal hernia. Stomach distended with enteric contrast, no gastric wall thickening. No small bowel dilatation or inflammation. No  bowel obstruction. Multifocal colonic diverticulosis, prominent in the descending and sigmoid colon. No discrete pericolonic or para diverticular inflammatory change. Normal appendix. Vascular/Lymphatic: Aortic and branch atherosclerosis without aneurysm. Retroperitoneal calcifications in the left upper quadrant and adjacent to the third portion of the duodenum are stable from prior exam, likely sequela of prior granulomatous disease. No noncalcified adenopathy. Reproductive: Stable enlarged prostate gland spanning 6.5 cm. Other: No free air, free fluid, or intra-abdominal fluid collection. Fat within the inguinal canals, right greater than left. Musculoskeletal: Stable degenerative change in the spine. There are no acute or suspicious osseous abnormalities. IMPRESSION: 1. No acute abnormality or explanation for abdominal pain. 2. Colonic diverticulosis without diverticulitis. 3. Abdominal aortic atherosclerosis, no aneurysm. 4. Nonobstructing left renal calculi. Electronically Signed   By: Jeb Levering M.D.   On: 05/03/2017 21:57    Assessment & Plan:   Orvile was seen today for possible pulled muscle.  Diagnoses and all orders for this  visit:  Strain of lumbar region, initial encounter -     methocarbamol (ROBAXIN) 500 MG tablet; Take 1 tablet (500 mg total) by mouth 3 (three) times daily. As needed.   I am having Almond Lint. Welden start on methocarbamol. I am also having him maintain his latanoprost, acetaminophen, oxybutynin, fexofenadine, atorvastatin, losartan, traMADol, omeprazole, flecainide, XARELTO, diclofenac sodium, amLODipine, and tamsulosin. We will continue to administer sodium chloride.  Meds ordered this encounter  Medications  . methocarbamol (ROBAXIN) 500 MG tablet    Sig: Take 1 tablet (500 mg total) by mouth 3 (three) times daily. As needed.    Dispense:  30 tablet    Refill:  1    Please fill patient's standing rx for ultram.   Asked pt to take standing ultram bid for the  next week.   Follow-up: Return follow up with Dr. Jenny Reichmann.Libby Maw, MD

## 2018-04-05 ENCOUNTER — Other Ambulatory Visit: Payer: Self-pay | Admitting: Internal Medicine

## 2018-04-12 DIAGNOSIS — Z961 Presence of intraocular lens: Secondary | ICD-10-CM | POA: Diagnosis not present

## 2018-04-12 DIAGNOSIS — H1132 Conjunctival hemorrhage, left eye: Secondary | ICD-10-CM | POA: Diagnosis not present

## 2018-04-29 ENCOUNTER — Other Ambulatory Visit: Payer: Self-pay | Admitting: Internal Medicine

## 2018-05-03 ENCOUNTER — Ambulatory Visit (INDEPENDENT_AMBULATORY_CARE_PROVIDER_SITE_OTHER): Payer: Medicare Other | Admitting: Internal Medicine

## 2018-05-03 ENCOUNTER — Encounter: Payer: Self-pay | Admitting: Internal Medicine

## 2018-05-03 VITALS — BP 124/86 | HR 72 | Temp 98.1°F | Ht 69.0 in | Wt 184.0 lb

## 2018-05-03 DIAGNOSIS — R972 Elevated prostate specific antigen [PSA]: Secondary | ICD-10-CM | POA: Diagnosis not present

## 2018-05-03 DIAGNOSIS — E785 Hyperlipidemia, unspecified: Secondary | ICD-10-CM | POA: Diagnosis not present

## 2018-05-03 DIAGNOSIS — I10 Essential (primary) hypertension: Secondary | ICD-10-CM | POA: Diagnosis not present

## 2018-05-03 DIAGNOSIS — R739 Hyperglycemia, unspecified: Secondary | ICD-10-CM

## 2018-05-03 MED ORDER — TRAMADOL HCL 50 MG PO TABS: 50.0000 mg | ORAL_TABLET | Freq: Four times a day (QID) | ORAL | 5 refills | Status: DC | PRN

## 2018-05-03 MED ORDER — ZOSTER VAC RECOMB ADJUVANTED 50 MCG/0.5ML IM SUSR: 0.5000 mL | Freq: Once | INTRAMUSCULAR | 1 refills | Status: AC

## 2018-05-03 NOTE — Assessment & Plan Note (Signed)
stable overall by history and exam, recent data reviewed with pt, and pt to continue medical treatment as before,  to f/u any worsening symptoms or concerns  

## 2018-05-03 NOTE — Assessment & Plan Note (Signed)
Asympt, also for psa f/u today

## 2018-05-03 NOTE — Assessment & Plan Note (Signed)
stable overall by history and exam, recent data reviewed with pt, and pt to continue medical treatment as before,  to f/u any worsening symptoms or concerns, for f/u lab 

## 2018-05-03 NOTE — Patient Instructions (Addendum)
Your shingles shot prescription was sent to the pharmacy, but remember to ask about the cost  Please continue all other medications as before, and refills have been done if requested.  Please have the pharmacy call with any other refills you may need.  Please continue your efforts at being more active, low cholesterol diet, and weight control.  You are otherwise up to date with prevention measures today.  Please keep your appointments with your specialists as you may have planned  Please return in 6 months, or sooner if needed

## 2018-05-03 NOTE — Progress Notes (Signed)
Subjective:    Patient ID: [name], male    DOB: 1941/04/03, 77 y.o.   MRN: [id number]  HPI  Here for yearly f/u;  Overall doing ok;  Pt denies Chest pain, worsening SOB, DOE, wheezing, orthopnea, PND, worsening LE edema, palpitations, dizziness or syncope.  Pt denies neurological change such as new headache, facial or extremity weakness.  Pt denies polydipsia, polyuria, or low sugar symptoms. Pt states overall good compliance with treatment and medications, good tolerability, and has been trying to follow appropriate diet.  Pt denies worsening depressive symptoms, suicidal ideation or panic. No fever, night sweats, wt loss, loss of appetite, or other constitutional symptoms.  Pt states good ability with ADL's, has low fall risk, home safety reviewed and adequate, no other significant changes in hearing or vision, and only occasionally active with exercise. No new complaints Denies urinary symptoms such as dysuria, frequency, urgency, flank pain, hematuria or n/v, fever, chills. Past Medical History:  Diagnosis Date  . Abdominal pain, epigastric   . Allergic rhinitis, cause unspecified   . Arthritis   . BPH (benign prostatic hyperplasia)   . CHF (congestive heart failure) ([place])   . Chronic anticoagulation    xarelto  . Coronary artery calcification seen on CAT scan 12/26/2012  . Depressive disorder, not elsewhere classified   . Diverticulosis of colon (without mention of hemorrhage)   . Esophageal reflux   . High risk medication use   . Hyperlipidemia   . Hyperplastic rectal polyp   . Hypertension   . Lumbago   . Pacemaker   . Paralyzed hemidiaphragm    LEFT  . Persistent atrial fibrillation ([place])   . Sick sinus syndrome [facility]) July 2010   s/p PPM [facility]) by [name]   Past Surgical History:  Procedure Laterality Date  . ESOPHAGOGASTRODUODENOSCOPY N/A 12/05/2013   Procedure: ESOPHAGOGASTRODUODENOSCOPY (EGD);  Surgeon: [name], MD;  Location: [facility];  Service:  Endoscopy;  Laterality: N/A;  . ESOPHAGOGASTRODUODENOSCOPY N/A 07/04/2015   Procedure: ESOPHAGOGASTRODUODENOSCOPY (EGD);  Surgeon: [name], MD;  Location: [place];  Service: Endoscopy;  Laterality: N/A;  . INSERT / REPLACE / REMOVE PACEMAKER  05/2009    [employer] ALTRUA 60  (serial number [id number]) dual-chamber pacemaker  . PACEMAKER PLACEMENT Bilateral    '05    reports that he quit smoking about 39 years ago. His smoking use included cigarettes. He has a 12.50 pack-year smoking history. He has quit using smokeless tobacco. His smokeless tobacco use included chew. He reports that he does not drink alcohol or use drugs. family history includes Arthritis in his paternal grandmother; Asthma in his sister; Cancer in his father; Diabetes in his mother; Hypertension in his brother, daughter, father, mother, sister, and son; Lung cancer in his father. Allergies  Allergen Reactions  . Pantoprazole Sodium Itching   Current Outpatient Medications on File Prior to Visit  Medication Sig Dispense Refill  . acetaminophen (TYLENOL ARTHRITIS PAIN) 650 MG CR tablet Take 650 mg by mouth every 8 (eight) hours as needed for pain.    Marland Kitchen amLODipine (NORVASC) 5 MG tablet TAKE 1 TABLET (5 MG TOTAL) BY MOUTH DAILY. 90 tablet 3  . atorvastatin (LIPITOR) 80 MG tablet TAKE 1 TABLET BY MOUTH EVERY DAY 90 tablet 0  . diclofenac sodium (VOLTAREN) 1 % GEL APPLY 4 GRAMS TOPICALLY 4 TIMES A DAY AS NEEDED 400 g 4  . fexofenadine (ALLEGRA) 180 MG tablet TAKE 1 TABLET (180 MG TOTAL) BY MOUTH DAILY.  90 tablet 1  . flecainide (TAMBOCOR) 100 MG tablet TAKE 1 TABLET BY MOUTH TWICE A DAY 180 tablet 3  . latanoprost (XALATAN) 0.005 % ophthalmic solution Place 1 drop into both eyes daily.    Marland Kitchen losartan (COZAAR) 25 MG tablet TAKE 1 TABLET BY MOUTH EVERY DAY 90 tablet 0  . methocarbamol (ROBAXIN) 500 MG tablet Take 1 tablet (500 mg total) by mouth 3 (three) times daily. As needed. 30 tablet 1  . omeprazole (PRILOSEC)  40 MG capsule TAKE 1 CAPSULE BY MOUTH EVERY DAY 90 capsule 1  . oxybutynin (DITROPAN-XL) 10 MG 24 hr tablet TAKE 1 TABLET (10 MG TOTAL) BY MOUTH DAILY. 90 tablet 1  . tamsulosin (FLOMAX) 0.4 MG CAPS capsule TAKE ONE CAPSULE BY MOUTH TWICE A DAY 60 capsule 9  . XARELTO 20 MG TABS tablet TAKE 1 TABLET BY MOUTH EVERY DAY 90 tablet 3  . [DISCONTINUED] furosemide (LASIX) 40 MG tablet Take 1 tablet (40 mg total) by mouth daily. 30 tablet 11   Current Facility-Administered Medications on File Prior to Visit  Medication Dose Route Frequency Provider Last Rate Last Dose  . 0.9 %  sodium chloride infusion  500 mL Intravenous Once Armbruster, Carlota Raspberry, MD       Review of Systems Constitutional: Negative for other unusual diaphoresis, sweats, appetite or weight changes HENT: Negative for other worsening hearing loss, ear pain, facial swelling, mouth sores or neck stiffness.   Eyes: Negative for other worsening pain, redness or other visual disturbance.  Respiratory: Negative for other stridor or swelling Cardiovascular: Negative for other palpitations or other chest pain  Gastrointestinal: Negative for worsening diarrhea or loose stools, blood in stool, distention or other pain Genitourinary: Negative for hematuria, flank pain or other change in urine volume.  Musculoskeletal: Negative for myalgias or other joint swelling.  Skin: Negative for other color change, or other wound or worsening drainage.  Neurological: Negative for other syncope or numbness. Hematological: Negative for other adenopathy or swelling Psychiatric/Behavioral: Negative for hallucinations, other worsening agitation, SI, self-injury, or new decreased concentration All other system neg per pt    Objective:   Physical Exam BP 124/86   Pulse 72   Temp 98.1 F (36.7 C) (Oral)   Ht 5\' 9"  (1.753 m)   Wt 184 lb (83.5 kg)   SpO2 96%   BMI 27.17 kg/m  VS noted,  Constitutional: Pt is oriented to person, place, and time. Appears  well-developed and well-nourished, in no significant distress and comfortable Head: Normocephalic and atraumatic  Eyes: Conjunctivae and EOM are normal. Pupils are equal, round, and reactive to light Right Ear: External ear normal without discharge Left Ear: External ear normal without discharge Nose: Nose without discharge or deformity Mouth/Throat: Oropharynx is without other ulcerations and moist  Neck: Normal range of motion. Neck supple. No JVD present. No tracheal deviation present or significant neck LA or mass Cardiovascular: Normal rate, regular rhythm, normal heart sounds and intact distal pulses. Pulmonary/Chest: WOB normal and breath sounds without rales or wheezing  Abdominal: Soft. Bowel sounds are normal. NT. No HSM  Musculoskeletal: Normal range of motion. Exhibits no edema Lymphadenopathy: Has no other cervical adenopathy.  Neurological: Pt is alert and oriented to person, place, and time. Pt has normal reflexes. No cranial nerve deficit. Motor grossly intact, Gait intact Skin: Skin is warm and dry. No rash noted or new ulcerations Psychiatric:  Has normal mood and affect. Behavior is normal without agitation No other exam findings Lab Results  Component Value Date   WBC 8.6 05/03/2017   HGB 15.0 05/03/2017   HCT 42.7 05/03/2017   PLT 186 05/03/2017   GLUCOSE 146 (H) 05/03/2017   CHOL 201 (H) 11/18/2016   TRIG 81.0 11/18/2016   HDL 80.40 11/18/2016   LDLDIRECT 183.4 12/26/2013   LDLCALC 105 (H) 11/18/2016   ALT 26 05/03/2017   AST 34 05/03/2017   NA 140 05/03/2017   K 3.7 05/03/2017   CL 107 05/03/2017   CREATININE 0.68 05/03/2017   BUN 13 05/03/2017   CO2 21 (L) 05/03/2017   TSH 0.93 11/18/2016   PSA 1.91 04/20/2017   INR 1.45 07/01/2015   HGBA1C 6.2 11/18/2016       Assessment & Plan:

## 2018-06-06 ENCOUNTER — Ambulatory Visit (INDEPENDENT_AMBULATORY_CARE_PROVIDER_SITE_OTHER): Payer: Medicare Other | Admitting: *Deleted

## 2018-06-06 DIAGNOSIS — I480 Paroxysmal atrial fibrillation: Secondary | ICD-10-CM

## 2018-06-06 DIAGNOSIS — I495 Sick sinus syndrome: Secondary | ICD-10-CM | POA: Diagnosis not present

## 2018-06-06 LAB — CUP PACEART INCLINIC DEVICE CHECK
Brady Statistic RA Percent Paced: 86 %
Date Time Interrogation Session: 20190722040000
Implantable Lead Implant Date: 20100719
Implantable Lead Location: 753859
Implantable Lead Serial Number: 28454141
Lead Channel Pacing Threshold Amplitude: 0.8 V
Lead Channel Pacing Threshold Pulse Width: 0.4 ms
Lead Channel Sensing Intrinsic Amplitude: 2.7 mV
Lead Channel Setting Pacing Amplitude: 2.8 V
Lead Channel Setting Pacing Pulse Width: 1 ms
MDC IDC LEAD IMPLANT DT: 20100719
MDC IDC LEAD LOCATION: 753860
MDC IDC LEAD SERIAL: 28594836
MDC IDC MSMT LEADCHNL RA IMPEDANCE VALUE: 580 Ohm
MDC IDC MSMT LEADCHNL RV IMPEDANCE VALUE: 670 Ohm
MDC IDC MSMT LEADCHNL RV PACING THRESHOLD AMPLITUDE: 1.2 V
MDC IDC MSMT LEADCHNL RV PACING THRESHOLD PULSEWIDTH: 1 ms
MDC IDC MSMT LEADCHNL RV SENSING INTR AMPL: 9.8 mV
MDC IDC PG IMPLANT DT: 20100719
MDC IDC SET LEADCHNL RA PACING AMPLITUDE: 2 V
MDC IDC SET LEADCHNL RV SENSING SENSITIVITY: 2.5 mV
MDC IDC STAT BRADY RV PERCENT PACED: 18 %
Pulse Gen Serial Number: 764370

## 2018-06-06 NOTE — Progress Notes (Signed)
Pacemaker check in clinic. Normal device function. Thresholds, sensing, impedances consistent with previous measurements. Device programmed to maximize longevity. (169) ATR episodes, 13.9hours total since 12/06/17, + Flecainide/Xarelto. No high ventricular rates noted. Device programmed at appropriate safety margins. Histogram distribution appropriate for patient activity level. Device programmed to optimize intrinsic conduction. Estimated longevity 1.5 years. Patient will follow up with JA in 6 months. Patient education completed.

## 2018-06-26 ENCOUNTER — Other Ambulatory Visit: Payer: Self-pay | Admitting: Internal Medicine

## 2018-07-18 DIAGNOSIS — Z23 Encounter for immunization: Secondary | ICD-10-CM | POA: Diagnosis not present

## 2018-08-31 ENCOUNTER — Ambulatory Visit (INDEPENDENT_AMBULATORY_CARE_PROVIDER_SITE_OTHER): Payer: Medicare Other | Admitting: Internal Medicine

## 2018-08-31 ENCOUNTER — Encounter: Payer: Self-pay | Admitting: Internal Medicine

## 2018-08-31 VITALS — BP 116/86 | HR 78 | Temp 98.6°F | Ht 69.0 in | Wt 179.0 lb

## 2018-08-31 DIAGNOSIS — M545 Low back pain, unspecified: Secondary | ICD-10-CM

## 2018-08-31 DIAGNOSIS — R739 Hyperglycemia, unspecified: Secondary | ICD-10-CM | POA: Diagnosis not present

## 2018-08-31 DIAGNOSIS — I10 Essential (primary) hypertension: Secondary | ICD-10-CM

## 2018-08-31 MED ORDER — TIZANIDINE HCL 2 MG PO TABS: 2.0000 mg | ORAL_TABLET | Freq: Four times a day (QID) | ORAL | 0 refills | Status: DC | PRN

## 2018-08-31 MED ORDER — MELOXICAM 7.5 MG PO TABS: 7.5000 mg | ORAL_TABLET | Freq: Every day | ORAL | 2 refills | Status: DC | PRN

## 2018-08-31 NOTE — Assessment & Plan Note (Signed)
stable overall by history and exam, recent data reviewed with pt, and pt to continue medical treatment as before,  to f/u any worsening symptoms or concerns  

## 2018-08-31 NOTE — Progress Notes (Signed)
Subjective:    Patient ID: [name], male    DOB: 14-Oct-1941, 77 y.o.   MRN: [id number]  HPI  Here with c/o acute onset mild to mod constant 1 wk left lower back pain after reaching to the floor to pick up something with the left arm, and just not getting better.  Pt denies bowel or bladder change, fever, wt loss,  worsening LE pain/numbness/weakness, gait change or falls.  This has happened before and seen a few yrs ago with muscle relaxer that seemed to resolve it after about 1 wk  Denies urinary symptoms such as dysuria, frequency, urgency, flank pain, hematuria or n/v, fever, chills.  Denies worsening reflux, abd pain, dysphagia, n/v, bowel change or blood.   Past Medical History:  Diagnosis Date  . Abdominal pain, epigastric   . Allergic rhinitis, cause unspecified   . Arthritis   . BPH (benign prostatic hyperplasia)   . CHF (congestive heart failure) ([place])   . Chronic anticoagulation    xarelto  . Coronary artery calcification seen on CAT scan 12/26/2012  . Depressive disorder, not elsewhere classified   . Diverticulosis of colon (without mention of hemorrhage)   . Esophageal reflux   . High risk medication use   . Hyperlipidemia   . Hyperplastic rectal polyp   . Hypertension   . Lumbago   . Pacemaker   . Paralyzed hemidiaphragm    LEFT  . Persistent atrial fibrillation   . Sick sinus syndrome [facility]) July 2010   s/p PPM [facility]) by [name]   Past Surgical History:  Procedure Laterality Date  . ESOPHAGOGASTRODUODENOSCOPY N/A 12/05/2013   Procedure: ESOPHAGOGASTRODUODENOSCOPY (EGD);  Surgeon: [name], MD;  Location: [facility];  Service: Endoscopy;  Laterality: N/A;  . ESOPHAGOGASTRODUODENOSCOPY N/A 07/04/2015   Procedure: ESOPHAGOGASTRODUODENOSCOPY (EGD);  Surgeon: [name], MD;  Location: [place];  Service: Endoscopy;  Laterality: N/A;  . INSERT / REPLACE / REMOVE PACEMAKER  05/2009    [employer] ALTRUA 60  (serial number [id number])  dual-chamber pacemaker  . PACEMAKER PLACEMENT Bilateral    '05    reports that he quit smoking about 39 years ago. His smoking use included cigarettes. He has a 12.50 pack-year smoking history. He has quit using smokeless tobacco.  His smokeless tobacco use included chew. He reports that he does not drink alcohol or use drugs. family history includes Arthritis in his paternal grandmother; Asthma in his sister; Cancer in his father; Diabetes in his mother; Hypertension in his brother, daughter, father, mother, sister, and son; Lung cancer in his father. Allergies  Allergen Reactions  . Pantoprazole Sodium Itching   Current Outpatient Medications on File Prior to Visit  Medication Sig Dispense Refill  . acetaminophen (TYLENOL ARTHRITIS PAIN) 650 MG CR tablet Take 650 mg by mouth every 8 (eight) hours as needed for pain.    Marland Kitchen amLODipine (NORVASC) 5 MG tablet TAKE 1 TABLET (5 MG TOTAL) BY MOUTH DAILY. 90 tablet 3  . atorvastatin (LIPITOR) 80 MG tablet TAKE 1 TABLET BY MOUTH EVERY DAY 90 tablet 1  . diclofenac sodium (VOLTAREN) 1 % GEL APPLY 4 GRAMS TOPICALLY 4 TIMES A DAY AS NEEDED 400 g 4  . fexofenadine (ALLEGRA) 180 MG tablet TAKE 1 TABLET (180 MG TOTAL) BY MOUTH DAILY. 90 tablet 1  . flecainide (TAMBOCOR) 100 MG tablet TAKE 1 TABLET BY MOUTH TWICE A DAY 180 tablet 3  . latanoprost (XALATAN) 0.005 % ophthalmic solution Place 1 drop into both  eyes daily.    Marland Kitchen losartan (COZAAR) 25 MG tablet TAKE 1 TABLET BY MOUTH EVERY DAY 90 tablet 1  . methocarbamol (ROBAXIN) 500 MG tablet Take 1 tablet (500 mg total) by mouth 3 (three) times daily. As needed. 30 tablet 1  . omeprazole (PRILOSEC) 40 MG capsule TAKE 1 CAPSULE BY MOUTH EVERY DAY 90 capsule 1  . oxybutynin (DITROPAN-XL) 10 MG 24 hr tablet TAKE 1 TABLET (10 MG TOTAL) BY MOUTH DAILY. 90 tablet 1  . tamsulosin (FLOMAX) 0.4 MG CAPS capsule TAKE ONE CAPSULE BY MOUTH TWICE A DAY 60 capsule 9  . traMADol (ULTRAM) 50 MG tablet Take 1 tablet (50 mg total)  by mouth every 6 (six) hours as needed. for pain 120 tablet 5  . XARELTO 20 MG TABS tablet TAKE 1 TABLET BY MOUTH EVERY DAY 90 tablet 3  . [DISCONTINUED] furosemide (LASIX) 40 MG tablet Take 1 tablet (40 mg total) by mouth daily. 30 tablet 11   Current Facility-Administered Medications on File Prior to Visit  Medication Dose Route Frequency Provider Last Rate Last Dose  . 0.9 %  sodium chloride infusion  500 mL Intravenous Once Armbruster, Carlota Raspberry, MD       Review of Systems  Constitutional: Negative for other unusual diaphoresis or sweats HENT: Negative for ear discharge or swelling Eyes: Negative for other worsening visual disturbances Respiratory: Negative for stridor or other swelling  Gastrointestinal: Negative for worsening distension or other blood Genitourinary: Negative for retention or other urinary change Musculoskeletal: Negative for other MSK pain or swelling Skin: Negative for color change or other new lesions Neurological: Negative for worsening tremors and other numbness  Psychiatric/Behavioral: Negative for worsening agitation or other fatigue All other system neg per pt    Objective:   Physical Exam BP 116/86   Pulse 78   Temp 98.6 F (37 C) (Oral)   Ht 5\' 9"  (1.753 m)   Wt 179 lb (81.2 kg)   SpO2 96%   BMI 26.43 kg/m  VS noted, not ill appearing Constitutional: Pt appears in NAD HENT: Head: NCAT.  Right Ear: External ear normal.  Left Ear: External ear normal.  Eyes: . Pupils are equal, round, and reactive to light. Conjunctivae and EOM are normal Nose: without d/c or deformity Neck: Neck supple. Gross normal ROM Cardiovascular: Normal rate and regular rhythm.   Pulmonary/Chest: Effort normal and breath sounds without rales or wheezing.  Spine nontender + left lumbar paravertebral tender spasm Abd:  Soft, NT, ND, + BS, no organomegaly Neurological: Pt is alert. At baseline orientation, motor grossly intact Skin: Skin is warm. No rashes, other new  lesions, no LE edema Psychiatric: Pt behavior is normal without agitation  No other exam findings Lab Results  Component Value Date   WBC 8.6 05/03/2017   HGB 15.0 05/03/2017   HCT 42.7 05/03/2017   PLT 186 05/03/2017   GLUCOSE 146 (H) 05/03/2017   CHOL 201 (H) 11/18/2016   TRIG 81.0 11/18/2016   HDL 80.40 11/18/2016   LDLDIRECT 183.4 12/26/2013   LDLCALC 105 (H) 11/18/2016   ALT 26 05/03/2017   AST 34 05/03/2017   NA 140 05/03/2017   K 3.7 05/03/2017   CL 107 05/03/2017   CREATININE 0.68 05/03/2017   BUN 13 05/03/2017   CO2 21 (L) 05/03/2017   TSH 0.93 11/18/2016   PSA 1.91 04/20/2017   INR 1.45 07/01/2015   HGBA1C 6.2 11/18/2016       Assessment & Plan:

## 2018-08-31 NOTE — Assessment & Plan Note (Signed)
Exam c/w msk pain/muscle spasm, no neuro changes, for nsaid prn, muscle relaxer prn,  to f/u any worsening symptoms or concerns

## 2018-08-31 NOTE — Patient Instructions (Signed)
Please take all new medication as prescribed - the muscle relaxer as needed, and the anti-inflammatory  Please continue all other medications as before, and refills have been done if requested.  Please have the pharmacy call with any other refills you may need.  Please keep your appointments with your specialists as you may have planned

## 2018-09-15 ENCOUNTER — Other Ambulatory Visit: Payer: Self-pay | Admitting: Internal Medicine

## 2018-09-15 DIAGNOSIS — H2513 Age-related nuclear cataract, bilateral: Secondary | ICD-10-CM | POA: Diagnosis not present

## 2018-09-15 DIAGNOSIS — H401131 Primary open-angle glaucoma, bilateral, mild stage: Secondary | ICD-10-CM | POA: Diagnosis not present

## 2018-09-19 ENCOUNTER — Other Ambulatory Visit: Payer: Self-pay | Admitting: Internal Medicine

## 2018-10-05 ENCOUNTER — Other Ambulatory Visit: Payer: Self-pay | Admitting: Internal Medicine

## 2018-10-24 ENCOUNTER — Other Ambulatory Visit: Payer: Self-pay | Admitting: Internal Medicine

## 2018-11-02 ENCOUNTER — Other Ambulatory Visit (INDEPENDENT_AMBULATORY_CARE_PROVIDER_SITE_OTHER): Payer: Medicare Other

## 2018-11-02 ENCOUNTER — Ambulatory Visit (INDEPENDENT_AMBULATORY_CARE_PROVIDER_SITE_OTHER): Payer: Medicare Other | Admitting: Internal Medicine

## 2018-11-02 ENCOUNTER — Encounter: Payer: Self-pay | Admitting: Internal Medicine

## 2018-11-02 VITALS — BP 124/76 | HR 71 | Temp 98.6°F | Ht 69.0 in | Wt 188.0 lb

## 2018-11-02 DIAGNOSIS — N318 Other neuromuscular dysfunction of bladder: Secondary | ICD-10-CM | POA: Diagnosis not present

## 2018-11-02 DIAGNOSIS — E785 Hyperlipidemia, unspecified: Secondary | ICD-10-CM

## 2018-11-02 DIAGNOSIS — R739 Hyperglycemia, unspecified: Secondary | ICD-10-CM

## 2018-11-02 DIAGNOSIS — N320 Bladder-neck obstruction: Secondary | ICD-10-CM

## 2018-11-02 DIAGNOSIS — I10 Essential (primary) hypertension: Secondary | ICD-10-CM | POA: Diagnosis not present

## 2018-11-02 LAB — CBC WITH DIFFERENTIAL/PLATELET
Basophils Absolute: 0.1 10*3/uL (ref 0.0–0.1)
Basophils Relative: 1.1 % (ref 0.0–3.0)
Eosinophils Absolute: 0.1 10*3/uL (ref 0.0–0.7)
Eosinophils Relative: 1.3 % (ref 0.0–5.0)
HCT: 44.3 % (ref 39.0–52.0)
Hemoglobin: 15 g/dL (ref 13.0–17.0)
LYMPHS PCT: 30.7 % (ref 12.0–46.0)
Lymphs Abs: 2 10*3/uL (ref 0.7–4.0)
MCHC: 33.9 g/dL (ref 30.0–36.0)
MCV: 92 fl (ref 78.0–100.0)
MONOS PCT: 12.3 % — AB (ref 3.0–12.0)
Monocytes Absolute: 0.8 10*3/uL (ref 0.1–1.0)
Neutro Abs: 3.5 10*3/uL (ref 1.4–7.7)
Neutrophils Relative %: 54.6 % (ref 43.0–77.0)
Platelets: 192 10*3/uL (ref 150.0–400.0)
RBC: 4.81 Mil/uL (ref 4.22–5.81)
RDW: 14.2 % (ref 11.5–15.5)
WBC: 6.4 10*3/uL (ref 4.0–10.5)

## 2018-11-02 LAB — URINALYSIS, ROUTINE W REFLEX MICROSCOPIC
Bilirubin Urine: NEGATIVE
Ketones, ur: NEGATIVE
Leukocytes, UA: NEGATIVE
Nitrite: NEGATIVE
Specific Gravity, Urine: 1.01 (ref 1.000–1.030)
TOTAL PROTEIN, URINE-UPE24: NEGATIVE
Urine Glucose: NEGATIVE
Urobilinogen, UA: 0.2 (ref 0.0–1.0)
pH: 6 (ref 5.0–8.0)

## 2018-11-02 LAB — HEPATIC FUNCTION PANEL
ALT: 22 U/L (ref 0–53)
AST: 19 U/L (ref 0–37)
Albumin: 4.5 g/dL (ref 3.5–5.2)
Alkaline Phosphatase: 86 U/L (ref 39–117)
Bilirubin, Direct: 0.1 mg/dL (ref 0.0–0.3)
Total Bilirubin: 0.6 mg/dL (ref 0.2–1.2)
Total Protein: 7 g/dL (ref 6.0–8.3)

## 2018-11-02 LAB — BASIC METABOLIC PANEL
BUN: 15 mg/dL (ref 6–23)
CALCIUM: 9.7 mg/dL (ref 8.4–10.5)
CO2: 29 mEq/L (ref 19–32)
Chloride: 103 mEq/L (ref 96–112)
Creatinine, Ser: 0.92 mg/dL (ref 0.40–1.50)
GFR: 102.99 mL/min (ref >60.00)
Glucose, Bld: 102 mg/dL — ABNORMAL HIGH (ref 70–99)
Potassium: 4.5 mEq/L (ref 3.5–5.1)
Sodium: 141 mEq/L (ref 135–145)

## 2018-11-02 LAB — LIPID PANEL
CHOLESTEROL: 167 mg/dL (ref 0–200)
HDL: 71.4 mg/dL (ref >39.00)
LDL Cholesterol: 87 mg/dL (ref 0–99)
NonHDL: 96.01
Total CHOL/HDL Ratio: 2
Triglycerides: 47 mg/dL (ref 0.0–149.0)
VLDL: 9.4 mg/dL (ref 0.0–40.0)

## 2018-11-02 LAB — TSH: TSH: 0.97 u[IU]/mL (ref 0.35–4.50)

## 2018-11-02 LAB — PSA: PSA: 2.03 ng/mL (ref 0.10–4.00)

## 2018-11-02 LAB — HEMOGLOBIN A1C: Hgb A1c MFr Bld: 6 % (ref 4.6–6.5)

## 2018-11-02 MED ORDER — ZOSTER VAC RECOMB ADJUVANTED 50 MCG/0.5ML IM SUSR: 0.5000 mL | Freq: Once | INTRAMUSCULAR | 1 refills | Status: AC

## 2018-11-02 NOTE — Assessment & Plan Note (Signed)
stable overall by history and exam, recent data reviewed with pt, and pt to continue medical treatment as before,  to f/u any worsening symptoms or concerns  

## 2018-11-02 NOTE — Patient Instructions (Addendum)
Your shingles shot prescription was sent to the pharmacy  Please continue all other medications as before, and refills have been done if requested.  Please have the pharmacy call with any other refills you may need.  Please continue your efforts at being more active, low cholesterol diet, and weight control.  You are otherwise up to date with prevention measures today.  Please keep your appointments with your specialists as you may have planned  Please go to the LAB in the Basement (turn left off the elevator) for the tests to be done today  You will be contacted by phone if any changes need to be made immediately.  Otherwise, you will receive a letter about your results with an explanation, but please check with MyChart first.  Please remember to sign up for MyChart if you have not done so, as this will be important to you in the future with finding out test results, communicating by private email, and scheduling acute appointments online when needed.  Please return in 1 year for your yearly visit, or sooner if needed

## 2018-11-02 NOTE — Progress Notes (Signed)
Subjective:    Patient ID: [name], male    DOB: 17-Oct-1941, 77 y.o.   MRN: [id number]  HPI  Here for yearly f/u;  Overall doing ok;  Pt denies Chest pain, worsening SOB, DOE, wheezing, orthopnea, PND, worsening LE edema, palpitations, dizziness or syncope.  Pt denies neurological change such as new headache, facial or extremity weakness.  Pt denies polydipsia, polyuria, or low sugar symptoms. Pt states overall good compliance with treatment and medications, good tolerability, and has been trying to follow appropriate diet.  Pt denies worsening depressive symptoms, suicidal ideation or panic. No fever, night sweats, wt loss, loss of appetite, or other constitutional symptoms.  Pt states good ability with ADL's, has low fall risk, home safety reviewed and adequate, no other significant changes in hearing or vision, and only occasionally active with exercise.  Bp at home 147/82, then took meds. Denies urinary symptoms such as dysuria, frequency, urgency, flank pain, hematuria or n/v, fever, chills, but has primary issue of nocturia, has seen Urology and essentially found numerous OAB meds ineffective, and asked to come back prn.   Past Medical History:  Diagnosis Date  . Abdominal pain, epigastric   . Allergic rhinitis, cause unspecified   . Arthritis   . BPH (benign prostatic hyperplasia)   . CHF (congestive heart failure) ([place])   . Chronic anticoagulation    xarelto  . Coronary artery calcification seen on CAT scan 12/26/2012  . Depressive disorder, not elsewhere classified   . Diverticulosis of colon (without mention of hemorrhage)   . Esophageal reflux   . High risk medication use   . Hyperlipidemia   . Hyperplastic rectal polyp   . Hypertension   . Lumbago   . Pacemaker   . Paralyzed hemidiaphragm    LEFT  . Persistent atrial fibrillation   . Sick sinus syndrome [facility]) July 2010   s/p PPM [facility]) by [name]   Past Surgical History:  Procedure Laterality Date  .  ESOPHAGOGASTRODUODENOSCOPY N/A 12/05/2013   Procedure: ESOPHAGOGASTRODUODENOSCOPY (EGD);  Surgeon: [name], MD;  Location: [facility];  Service: Endoscopy;  Laterality: N/A;  . ESOPHAGOGASTRODUODENOSCOPY N/A 07/04/2015   Procedure: ESOPHAGOGASTRODUODENOSCOPY (EGD);  Surgeon: [name], MD;  Location: [place];  Service: Endoscopy;  Laterality: N/A;  . INSERT / REPLACE / REMOVE PACEMAKER  05/2009    [employer] ALTRUA 60  (serial number [id number]) dual-chamber pacemaker  . PACEMAKER PLACEMENT Bilateral    '05    reports that he quit smoking about 39 years ago. His smoking use included cigarettes. He has a 12.50 pack-year smoking history. He has quit using smokeless tobacco.  His smokeless tobacco use included chew. He reports that he does not drink alcohol or use drugs. family history includes Arthritis in his paternal grandmother; Asthma in his sister; Cancer in his father; Diabetes in his mother; Hypertension in his brother, daughter, father, mother, sister, and son; Lung cancer in his father. Allergies  Allergen Reactions  . Pantoprazole Sodium Itching   Current Outpatient Medications on File Prior to Visit  Medication Sig Dispense Refill  . acetaminophen (TYLENOL ARTHRITIS PAIN) 650 MG CR tablet Take 650 mg by mouth every 8 (eight) hours as needed for pain.    Marland Kitchen amLODipine (NORVASC) 5 MG tablet TAKE 1 TABLET (5 MG TOTAL) BY MOUTH DAILY. 90 tablet 3  . atorvastatin (LIPITOR) 80 MG tablet TAKE 1 TABLET BY MOUTH EVERY DAY 90 tablet 1  . diclofenac sodium (VOLTAREN) 1 % GEL  APPLY 4 GRAMS TOPICALLY 4 TIMES A DAY AS NEEDED 400 g 4  . fexofenadine (ALLEGRA) 180 MG tablet TAKE 1 TABLET (180 MG TOTAL) BY MOUTH DAILY. 90 tablet 1  . flecainide (TAMBOCOR) 100 MG tablet TAKE 1 TABLET BY MOUTH TWICE A DAY 180 tablet 3  . latanoprost (XALATAN) 0.005 % ophthalmic solution Place 1 drop into both eyes daily.    Marland Kitchen losartan (COZAAR) 25 MG tablet TAKE 1 TABLET BY MOUTH EVERY DAY 90 tablet 1   . meloxicam (MOBIC) 7.5 MG tablet Take 1 tablet (7.5 mg total) by mouth daily as needed for pain. 30 tablet 2  . methocarbamol (ROBAXIN) 500 MG tablet Take 1 tablet (500 mg total) by mouth 3 (three) times daily. As needed. 30 tablet 1  . omeprazole (PRILOSEC) 40 MG capsule TAKE 1 CAPSULE BY MOUTH EVERY DAY 90 capsule 1  . oxybutynin (DITROPAN-XL) 10 MG 24 hr tablet TAKE 1 TABLET (10 MG TOTAL) BY MOUTH DAILY. 90 tablet 1  . tamsulosin (FLOMAX) 0.4 MG CAPS capsule TAKE 1 CAPSULE BY MOUTH TWICE A DAY 180 capsule 1  . tiZANidine (ZANAFLEX) 2 MG tablet TAKE 1 TABLET (2 MG TOTAL) BY MOUTH EVERY 6 (SIX) HOURS AS NEEDED FOR MUSCLE SPASMS. 50 tablet 0  . traMADol (ULTRAM) 50 MG tablet Take 1 tablet (50 mg total) by mouth every 6 (six) hours as needed. for pain 120 tablet 5  . XARELTO 20 MG TABS tablet TAKE 1 TABLET BY MOUTH EVERY DAY 90 tablet 3  . [DISCONTINUED] furosemide (LASIX) 40 MG tablet Take 1 tablet (40 mg total) by mouth daily. 30 tablet 11   No current facility-administered medications on file prior to visit.    Review of Systems Constitutional: Negative for other unusual diaphoresis, sweats, appetite or weight changes HENT: Negative for other worsening hearing loss, ear pain, facial swelling, mouth sores or neck stiffness.   Eyes: Negative for other worsening pain, redness or other visual disturbance.  Respiratory: Negative for other stridor or swelling Cardiovascular: Negative for other palpitations or other chest pain  Gastrointestinal: Negative for worsening diarrhea or loose stools, blood in stool, distention or other pain Genitourinary: Negative for hematuria, flank pain or other change in urine volume.  Musculoskeletal: Negative for myalgias or other joint swelling.  Skin: Negative for other color change, or other wound or worsening drainage.  Neurological: Negative for other syncope or numbness. Hematological: Negative for other adenopathy or swelling Psychiatric/Behavioral:  Negative for hallucinations, other worsening agitation, SI, self-injury, or new decreased concentration All other system neg per pt    Objective:   Physical Exam BP 124/76   Pulse 71   Temp 98.6 F (37 C) (Oral)   Ht 5\' 9"  (1.753 m)   Wt 188 lb (85.3 kg)   SpO2 95%   BMI 27.76 kg/m  VS noted,  Constitutional: Pt is oriented to person, place, and time. Appears well-developed and well-nourished, in no significant distress and comfortable Head: Normocephalic and atraumatic  Eyes: Conjunctivae and EOM are normal. Pupils are equal, round, and reactive to light Right Ear: External ear normal without discharge Left Ear: External ear normal without discharge Nose: Nose without discharge or deformity Mouth/Throat: Oropharynx is without other ulcerations and moist  Neck: Normal range of motion. Neck supple. No JVD present. No tracheal deviation present or significant neck LA or mass Cardiovascular: Normal rate, regular rhythm, normal heart sounds and intact distal pulses.   Pulmonary/Chest: WOB normal and breath sounds without rales or wheezing  Abdominal:  Soft. Bowel sounds are normal. NT. No HSM  Musculoskeletal: Normal range of motion. Exhibits no edema Lymphadenopathy: Has no other cervical adenopathy.  Neurological: Pt is alert and oriented to person, place, and time. Pt has normal reflexes. No cranial nerve deficit. Motor grossly intact, Gait intact Skin: Skin is warm and dry. No rash noted or new ulcerations Psychiatric:  Has normal mood and affect. Behavior is normal without agitation No other exam findings Lab Results  Component Value Date   WBC 8.6 05/03/2017   HGB 15.0 05/03/2017   HCT 42.7 05/03/2017   PLT 186 05/03/2017   GLUCOSE 146 (H) 05/03/2017   CHOL 201 (H) 11/18/2016   TRIG 81.0 11/18/2016   HDL 80.40 11/18/2016   LDLDIRECT 183.4 12/26/2013   LDLCALC 105 (H) 11/18/2016   ALT 26 05/03/2017   AST 34 05/03/2017   NA 140 05/03/2017   K 3.7 05/03/2017   CL 107  05/03/2017   CREATININE 0.68 05/03/2017   BUN 13 05/03/2017   CO2 21 (L) 05/03/2017   TSH 0.93 11/18/2016   PSA 1.91 04/20/2017   INR 1.45 07/01/2015   HGBA1C 6.2 11/18/2016          Assessment & Plan:

## 2018-11-02 NOTE — Assessment & Plan Note (Signed)
Chronic persistent not amenable to mult meds, to cont oxybutinin ER 10 qd

## 2018-11-26 ENCOUNTER — Other Ambulatory Visit: Payer: Self-pay | Admitting: Internal Medicine

## 2018-12-06 ENCOUNTER — Other Ambulatory Visit: Payer: Self-pay | Admitting: Internal Medicine

## 2018-12-07 NOTE — Telephone Encounter (Signed)
Done erx 

## 2018-12-16 ENCOUNTER — Other Ambulatory Visit: Payer: Self-pay | Admitting: Internal Medicine

## 2018-12-16 NOTE — Progress Notes (Addendum)
Subjective:   [name] is a 78 y.o. male who presents for Medicare Annual/Subsequent preventive examination.  Review of Systems:  No ROS.  Medicare Wellness Visit. Additional risk factors are reflected in the social history.  Cardiac Risk Factors include: advanced age (>85men, >28 women);dyslipidemia;male gender;hypertension Sleep patterns: has interrupted sleep, gets up 2-3 times nightly to void and sleeps 5-6 hours nightly. Discussed limiting fluids 2-3 hours before bed to decrease the amount of times patient gets up to void.  Home Safety/Smoke Alarms: Feels safe in home. Smoke alarms in place.  Living environment; residence and Firearm Safety: 2-story house. Lives with wife, no needs for DME, good support system Seat Belt Safety/Bike Helmet: Wears seat belt.   PSA-  Lab Results  Component Value Date   PSA 2.03 11/02/2018   PSA 1.91 04/20/2017   PSA 2.05 11/18/2016       Objective:    Vitals: BP 138/84   Pulse 60   Resp 17   Ht 5\' 9"  (1.753 m)   Wt 193 lb (87.5 kg)   SpO2 99%   BMI 28.50 kg/m   Body mass index is 28.5 kg/m.  Advanced Directives 12/19/2018 12/29/2017 12/16/2017 05/03/2017 11/12/2016 05/10/2016 06/30/2015  Does Patient Have a Medical Advance Directive? No Yes Yes No No No No  Type of Advance Directive - [place];Living will [place];Living will - - - -  Does patient want to make changes to medical advance directive? Yes (ED - Information included in AVS) No - Patient declined - - - - -  Copy of Healthcare Power of Attorney in Chart? - No - copy requested No - copy requested - - - -  Would patient like information on creating a medical advance directive? - No - Patient declined - - Yes (MAU/Ambulatory/Procedural Areas - Information given) No - patient declined information No - patient declined information    Tobacco Social History   Tobacco Use  Smoking Status Former Smoker  . Packs/day: 0.50  . Years: 25.00  .  Pack years: 12.50  . Types: Cigarettes  . Last attempt to quit: 11/16/1978  . Years since quitting: 40.1  Smokeless Tobacco Former Systems developer  . Types: Chew     Counseling given: Not Answered  Past Medical History:  Diagnosis Date  . Abdominal pain, epigastric   . Allergic rhinitis, cause unspecified   . Arthritis   . BPH (benign prostatic hyperplasia)   . CHF (congestive heart failure) ([place])   . Chronic anticoagulation    xarelto  . Coronary artery calcification seen on CAT scan 12/26/2012  . Depressive disorder, not elsewhere classified   . Diverticulosis of colon (without mention of hemorrhage)   . Esophageal reflux   . High risk medication use   . Hyperlipidemia   . Hyperplastic rectal polyp   . Hypertension   . Lumbago   . Pacemaker   . Paralyzed hemidiaphragm    LEFT  . Persistent atrial fibrillation   . Sick sinus syndrome [facility]) July 2010   s/p PPM [facility]) by [name]   Past Surgical History:  Procedure Laterality Date  . ESOPHAGOGASTRODUODENOSCOPY N/A 12/05/2013   Procedure: ESOPHAGOGASTRODUODENOSCOPY (EGD);  Surgeon: [name], MD;  Location: [facility];  Service: Endoscopy;  Laterality: N/A;  . ESOPHAGOGASTRODUODENOSCOPY N/A 07/04/2015   Procedure: ESOPHAGOGASTRODUODENOSCOPY (EGD);  Surgeon: [name], MD;  Location: [place];  Service: Endoscopy;  Laterality: N/A;  . INSERT / REPLACE / REMOVE PACEMAKER  05/2009    [employer] ALTRUA 60  (serial number [id number]) dual-chamber pacemaker  . PACEMAKER PLACEMENT Bilateral    '05   Family History  Problem Relation Age of Onset  . Diabetes Mother   . Hypertension Mother   . Hypertension Father   . Lung cancer Father   . Cancer Father   . Asthma Sister   . Hypertension Sister   . Arthritis Paternal Grandmother   . Hypertension Brother   . Hypertension Daughter   . Hypertension Son    Social History   Socioeconomic History  . Marital status: Married    Spouse name: Not on file  . Number  of children: 4  . Years of education: Not on file  . Highest education level: Not on file  Occupational History  . Occupation: retired    Fish farm manager: RETIRED  Social Needs  . Financial resource strain: Not hard at all  . Food insecurity:    Worry: Never true    Inability: Never true  . Transportation needs:    Medical: No    Non-medical: No  Tobacco Use  . Smoking status: Former Smoker    Packs/day: 0.50    Years: 25.00    Pack years: 12.50    Types: Cigarettes    Last attempt to quit: 11/16/1978    Years since quitting: 40.1  . Smokeless tobacco: Former Systems developer    Types: Chew  Substance and Sexual Activity  . Alcohol use: No  . Drug use: No  . Sexual activity: Never  Lifestyle  . Physical activity:    Days per week: 4 days    Minutes per session: 40 min  . Stress: Not at all  Relationships  . Social connections:    Talks on phone: More than three times a week    Gets together: More than three times a week    Attends religious service: More than 4 times per year    Active member of club or organization: Yes    Attends meetings of clubs or organizations: More than 4 times per year    Relationship status: Married  Other Topics Concern  . 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     Outpatient Encounter Medications as of 12/19/2018  Medication Sig  . acetaminophen (TYLENOL ARTHRITIS PAIN) 650 MG CR tablet Take 650 mg by mouth every 8 (eight) hours as needed for pain.  Marland Kitchen amLODipine (NORVASC) 5 MG tablet TAKE 1 TABLET (5 MG TOTAL) BY MOUTH DAILY.  Marland Kitchen atorvastatin (LIPITOR) 80 MG tablet TAKE 1 TABLET BY MOUTH EVERY DAY  . diclofenac sodium (VOLTAREN) 1 % GEL APPLY 4 GRAMS TOPICALLY 4 TIMES A DAY AS NEEDED  . fexofenadine (ALLEGRA) 180 MG tablet TAKE 1 TABLET (180 MG TOTAL) BY MOUTH DAILY.  Marland Kitchen latanoprost (XALATAN) 0.005 % ophthalmic solution Place 1 drop into both eyes daily.  Marland Kitchen losartan (COZAAR) 25 MG tablet TAKE 1 TABLET BY MOUTH EVERY DAY  .  meloxicam (MOBIC) 7.5 MG tablet TAKE 1 TABLET BY MOUTH DAILY AS NEEDED FOR PAIN  . omeprazole (PRILOSEC) 40 MG capsule TAKE 1 CAPSULE BY MOUTH EVERY DAY  . oxybutynin (DITROPAN-XL) 10 MG 24 hr tablet TAKE 1 TABLET (10 MG TOTAL) BY MOUTH DAILY.  . tamsulosin (FLOMAX) 0.4 MG CAPS capsule TAKE 1 CAPSULE BY MOUTH TWICE A DAY  . tiZANidine (ZANAFLEX) 2 MG tablet Take 1 tablet (2 mg total) by mouth every 6 (six) hours  as needed for muscle spasms.  . traMADol (ULTRAM) 50 MG tablet TAKE 1 TABLET (50 MG TOTAL) BY MOUTH EVERY 6 (SIX) HOURS AS NEEDED. FOR PAIN  . XARELTO 20 MG TABS tablet TAKE 1 TABLET BY MOUTH EVERY DAY  . [DISCONTINUED] flecainide (TAMBOCOR) 100 MG tablet TAKE 1 TABLET BY MOUTH TWICE A DAY  . [DISCONTINUED] furosemide (LASIX) 40 MG tablet Take 1 tablet (40 mg total) by mouth daily.  . [DISCONTINUED] methocarbamol (ROBAXIN) 500 MG tablet Take 1 tablet (500 mg total) by mouth 3 (three) times daily. As needed. (Patient not taking: Reported on 12/19/2018)  . [DISCONTINUED] tiZANidine (ZANAFLEX) 2 MG tablet TAKE 1 TABLET (2 MG TOTAL) BY MOUTH EVERY 6 (SIX) HOURS AS NEEDED FOR MUSCLE SPASMS.   No facility-administered encounter medications on file as of 12/19/2018.     Activities of Daily Living In your present state of health, do you have any difficulty performing the following activities: 12/19/2018  Hearing? N  Vision? N  Difficulty concentrating or making decisions? N  Walking or climbing stairs? N  Dressing or bathing? N  Doing errands, shopping? N  Preparing Food and eating ? N  Using the Toilet? N  In the past six months, have you accidently leaked urine? N  Do you have problems with loss of bowel control? N  Managing your Medications? N  Managing your Finances? N  Housekeeping or managing your Housekeeping? N  Some recent data might be hidden    Patient Care Team: Biagio Borg, MD as PCP - Bethena Roys, MD as Attending Physician (Cardiology)   Assessment:   This is  a routine wellness examination for Ariyan. Physical assessment deferred to PCP.  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 Encouraged patient to increase daily water and healthy fluid intake.  Goals      Patient Stated   . <enter goal here> (pt-stated)     Live healthier by making healthy food choices and maintain or increase activity level.      Other   . Patient Stated     Monitor my diet to decrease sugars, carbohydrates, and continue to not use a lot of salt. Start to drink more water daily. Go fishing a much as possible, enjoy life and family.    . Patient Stated     I want to eat better by cutting back on bread, white starches, and desserts.       Fall Risk Fall Risk  11/02/2018 05/03/2018 12/16/2017 11/01/2017 04/20/2017  Falls in the past year? 0 No No No No  Number falls in past yr: 1 - - - -  Injury with Fall? 0 - - - -   Depression Screen PHQ 2/9 Scores 11/02/2018 05/03/2018 12/16/2017 11/01/2017  PHQ - 2 Score 1 0 2 0  PHQ- 9 Score - - 6 -    Cognitive Function MMSE - Mini Mental State Exam 12/16/2017  Orientation to time 5  Orientation to Place 5  Registration 3  Attention/ Calculation 5  Recall 2  Language- name 2 objects 2  Language- repeat 1  Language- follow 3 step command 3  Language-  read & follow direction 1  Write a sentence 1  Copy design 1  Total score 29       Ad8 score reviewed for issues:  Issues making decisions: no  Less interest in hobbies / activities: no  Repeats questions,  stories (family complaining): no  Trouble using ordinary gadgets (microwave, computer, phone):no  Forgets the month or year: no  Mismanaging finances: no  Remembering appts: no  Daily problems with thinking and/or memory: no Ad8 score is= 0  Immunization History  Administered Date(s) Administered  . Influenza Split 08/16/2012, 08/17/2012, 08/16/2013  . Influenza Whole 08/16/2010, 08/17/2011  . Influenza, High Dose Seasonal PF 07/17/2018  . Influenza-Unspecified 08/11/2015, 07/17/2016  . Pneumococcal Conjugate-13 09/26/2013  . Pneumococcal Polysaccharide-23 10/25/2006, 09/16/2010  . Td 05/27/2009  . Zoster 10/25/2006   Screening Tests Health Maintenance  Topic Date Due  . TETANUS/TDAP  05/28/2019  . COLONOSCOPY  12/29/2022  . INFLUENZA VACCINE  Completed  . PNA vac Low Risk Adult  Completed       Plan:    Reviewed health maintenance screenings with patient today and relevant education, vaccines, and/or referrals were provided.   Continue doing brain stimulating activities (puzzles, reading, adult coloring books, staying active) to keep memory sharp.   Continue to eat heart healthy diet (full of fruits, vegetables, whole grains, lean protein, water--limit salt, fat, and sugar intake) and increase physical activity as tolerated.  I have personally reviewed and noted the following in the patient's chart:   . Medical and social history . Use of alcohol, tobacco or illicit drugs  . Current medications and supplements . Functional ability and status . Nutritional status . Physical activity . Advanced directives . List of other physicians . Vitals . Screenings to include cognitive, depression, and falls . Referrals and appointments  In addition, I have reviewed and discussed with patient certain preventive protocols, quality metrics, and best practice recommendations. A written personalized care plan for preventive services as well as general preventive health  recommendations were provided to patient.     Michiel Cowboy, RN  12/19/2018  Medical screening examination/treatment/procedure(s) were performed by non-physician practitioner and as supervising physician I was immediately available for consultation/collaboration. I agree with above. Scarlette Calico, MD

## 2018-12-17 MED ORDER — TIZANIDINE HCL 2 MG PO TABS: 2.0000 mg | ORAL_TABLET | Freq: Four times a day (QID) | ORAL | 0 refills | Status: DC | PRN

## 2018-12-18 ENCOUNTER — Other Ambulatory Visit: Payer: Self-pay | Admitting: Internal Medicine

## 2018-12-19 ENCOUNTER — Other Ambulatory Visit: Payer: Self-pay | Admitting: Internal Medicine

## 2018-12-19 ENCOUNTER — Ambulatory Visit (INDEPENDENT_AMBULATORY_CARE_PROVIDER_SITE_OTHER): Payer: Medicare Other | Admitting: *Deleted

## 2018-12-19 VITALS — BP 138/84 | HR 60 | Resp 17 | Ht 69.0 in | Wt 193.0 lb

## 2018-12-19 DIAGNOSIS — Z0000 Encounter for general adult medical examination without abnormal findings: Secondary | ICD-10-CM

## 2018-12-19 NOTE — Patient Instructions (Addendum)
Continue doing brain stimulating activities (puzzles, reading, adult coloring books, staying active) to keep memory sharp.   Continue to eat heart healthy diet (full of fruits, vegetables, whole grains, lean protein, water--limit salt, fat, and sugar intake) and increase physical activity as tolerated.  [name] , Thank you for taking time to come for your Medicare Wellness Visit. I appreciate your ongoing commitment to your health goals. Please review the following plan we discussed and let me know if I can assist you in the future.   These are the goals we discussed: Goals      Patient Stated   . <enter goal here> (pt-stated)     Live healthier by making healthy food choices and maintain or increase activity level.      Other   . Patient Stated     Monitor my diet to decrease sugars, carbohydrates, and continue to not use a lot of salt. Start to drink more water daily. Go fishing a much as possible, enjoy life and family.    . Patient Stated     I want to eat better by cutting back on bread, white starches, and desserts.       This is a list of the screening recommended for you and due dates:  Health Maintenance  Topic Date Due  . Tetanus Vaccine  05/28/2019  . Colon Cancer Screening  12/29/2022  . Flu Shot  Completed  . Pneumonia vaccines  Completed    Preventive Care 42 Years and Older, Male Preventive care refers to lifestyle choices and visits with your health care provider that can promote health and wellness. What does preventive care include?   A yearly physical exam. This is also called an annual well check.  Dental exams once or twice a year.  Routine eye exams. Ask your health care provider how often you should have your eyes checked.  Personal lifestyle choices, including: ? Daily care of your teeth and gums. ? Regular physical activity. ? Eating a healthy diet. ? Avoiding tobacco and drug use. ? Limiting alcohol use. ? Practicing safe sex. ? Taking low  doses of aspirin every day. ? Taking vitamin and mineral supplements as recommended by your health care provider. What happens during an annual well check? The services and screenings done by your health care provider during your annual well check will depend on your age, overall health, lifestyle risk factors, and family history of disease. Counseling Your health care provider may ask you questions about your:  Alcohol use.  Tobacco use.  Drug use.  Emotional well-being.  Home and relationship well-being.  Sexual activity.  Eating habits.  History of falls.  Memory and ability to understand (cognition).  Work and work Statistician. Screening You may have the following tests or measurements:  Height, weight, and BMI.  Blood pressure.  Lipid and cholesterol levels. These may be checked every 5 years, or more frequently if you are over 77 years old.  Skin check.  Lung cancer screening. You may have this screening every year starting at age 80 if you have a 30-pack-year history of smoking and currently smoke or have quit within the past 15 years.  Colorectal cancer screening. All adults should have this screening starting at age 67 and continuing until age 36. You will have tests every 1-10 years, depending on your results and the type of screening test. People at increased risk should start screening at an earlier age. Screening tests may include: ? Guaiac-based fecal occult blood  testing. ? Fecal immunochemical test (FIT). ? Stool DNA test. ? Virtual colonoscopy. ? Sigmoidoscopy. During this test, a flexible tube with a tiny camera (sigmoidoscope) is used to examine your rectum and lower colon. The sigmoidoscope is inserted through your anus into your rectum and lower colon. ? Colonoscopy. During this test, a long, thin, flexible tube with a tiny camera (colonoscope) is used to examine your entire colon and rectum.  Prostate cancer screening. Recommendations will vary  depending on your family history and other risks.  Hepatitis C blood test.  Hepatitis B blood test.  Sexually transmitted disease (STD) testing.  Diabetes screening. This is done by checking your blood sugar (glucose) after you have not eaten for a while (fasting). You may have this done every 1-3 years.  Abdominal aortic aneurysm (AAA) screening. You may need this if you are a current or former smoker.  Osteoporosis. You may be screened starting at age 41 if you are at high risk. Talk with your health care provider about your test results, treatment options, and if necessary, the need for more tests. Vaccines Your health care provider may recommend certain vaccines, such as:  Influenza vaccine. This is recommended every year.  Tetanus, diphtheria, and acellular pertussis (Tdap, Td) vaccine. You may need a Td booster every 10 years.  Varicella vaccine. You may need this if you have not been vaccinated.  Zoster vaccine. You may need this after age 10.  Measles, mumps, and rubella (MMR) vaccine. You may need at least one dose of MMR if you were born in 1957 or later. You may also need a second dose.  Pneumococcal 13-valent conjugate (PCV13) vaccine. One dose is recommended after age 42.  Pneumococcal polysaccharide (PPSV23) vaccine. One dose is recommended after age 52.  Meningococcal vaccine. You may need this if you have certain conditions.  Hepatitis A vaccine. You may need this if you have certain conditions or if you travel or work in places where you may be exposed to hepatitis A.  Hepatitis B vaccine. You may need this if you have certain conditions or if you travel or work in places where you may be exposed to hepatitis B.  Haemophilus influenzae type b (Hib) vaccine. You may need this if you have certain risk factors. Talk to your health care provider about which screenings and vaccines you need and how often you need them. This information is not intended to replace  advice given to you by your health care provider. Make sure you discuss any questions you have with your health care provider. Document Released: 11/29/2015 Document Revised: 12/23/2017 Document Reviewed: 09/03/2015 Elsevier Interactive Patient Education  2019 [employer].

## 2018-12-19 NOTE — Telephone Encounter (Signed)
Done erx 

## 2018-12-27 NOTE — Progress Notes (Signed)
Electrophysiology Office Note Date: 12/30/2018  ID:  [name], DOB [birth year], MRN [id number]  PCP: Biagio Borg, MD Electrophysiologist: Rayann Heman  CC: Pacemaker follow-up  [name] is a 78 y.o. male seen today for Dr Rayann Heman.  He presents today for routine electrophysiology followup.  Since last being seen in our clinic, the patient reports doing very well. He is limited physically by arthritis.  He denies chest pain, palpitations, dyspnea, PND, orthopnea, nausea, vomiting, dizziness, syncope, edema, weight gain, or early satiety.  Device History: BSX dual chamber PPM implanted 2010 for SSS   Past Medical History:  Diagnosis Date  . Abdominal pain, epigastric   . Allergic rhinitis, cause unspecified   . Arthritis   . BPH (benign prostatic hyperplasia)   . CHF (congestive heart failure) ([place])   . Chronic anticoagulation    xarelto  . Coronary artery calcification seen on CAT scan 12/26/2012  . Depressive disorder, not elsewhere classified   . Diverticulosis of colon (without mention of hemorrhage)   . Esophageal reflux   . High risk medication use   . Hyperlipidemia   . Hyperplastic rectal polyp   . Hypertension   . Lumbago   . Pacemaker   . Paralyzed hemidiaphragm    LEFT  . Persistent atrial fibrillation   . Sick sinus syndrome [facility]) July 2010   s/p PPM [facility]) by [name]   Past Surgical History:  Procedure Laterality Date  . ESOPHAGOGASTRODUODENOSCOPY N/A 12/05/2013   Procedure: ESOPHAGOGASTRODUODENOSCOPY (EGD);  Surgeon: [name], MD;  Location: [facility];  Service: Endoscopy;  Laterality: N/A;  . ESOPHAGOGASTRODUODENOSCOPY N/A 07/04/2015   Procedure: ESOPHAGOGASTRODUODENOSCOPY (EGD);  Surgeon: [name], MD;  Location: [place];  Service: Endoscopy;  Laterality: N/A;  . INSERT / REPLACE / REMOVE PACEMAKER  05/2009    [employer] ALTRUA 60  (serial number [id number]) dual-chamber pacemaker  . PACEMAKER PLACEMENT Bilateral    '05     Current Outpatient Medications  Medication Sig Dispense Refill  . acetaminophen (TYLENOL ARTHRITIS PAIN) 650 MG CR tablet Take 650 mg by mouth every 8 (eight) hours as needed for pain.    Marland Kitchen atorvastatin (LIPITOR) 80 MG tablet TAKE 1 TABLET BY MOUTH EVERY DAY 90 tablet 1  . diclofenac sodium (VOLTAREN) 1 % GEL APPLY 4 GRAMS TOPICALLY 4 TIMES A DAY AS NEEDED 400 g 4  . fexofenadine (ALLEGRA) 180 MG tablet TAKE 1 TABLET (180 MG TOTAL) BY MOUTH DAILY. 90 tablet 1  . flecainide (TAMBOCOR) 100 MG tablet TAKE 1 TABLET BY MOUTH TWICE A DAY 180 tablet 3  . latanoprost (XALATAN) 0.005 % ophthalmic solution Place 1 drop into both eyes daily.    Marland Kitchen losartan (COZAAR) 25 MG tablet TAKE 1 TABLET BY MOUTH EVERY DAY 90 tablet 1  . meloxicam (MOBIC) 7.5 MG tablet TAKE 1 TABLET BY MOUTH DAILY AS NEEDED FOR PAIN 30 tablet 2  . omeprazole (PRILOSEC) 40 MG capsule TAKE 1 CAPSULE BY MOUTH EVERY DAY 90 capsule 1  . oxybutynin (DITROPAN-XL) 10 MG 24 hr tablet TAKE 1 TABLET (10 MG TOTAL) BY MOUTH DAILY. 90 tablet 1  . tamsulosin (FLOMAX) 0.4 MG CAPS capsule TAKE 1 CAPSULE BY MOUTH TWICE A DAY 180 capsule 1  . tiZANidine (ZANAFLEX) 2 MG tablet Take 1 tablet (2 mg total) by mouth every 6 (six) hours as needed for muscle spasms. 50 tablet 0  . traMADol (ULTRAM) 50 MG tablet TAKE 1 TABLET (50 MG TOTAL) BY MOUTH EVERY  6 (SIX) HOURS AS NEEDED. FOR PAIN 120 tablet 2  . XARELTO 20 MG TABS tablet TAKE 1 TABLET BY MOUTH EVERY DAY 90 tablet 3  . amLODipine (NORVASC) 5 MG tablet TAKE 1 TABLET BY MOUTH EVERY DAY 90 tablet 1   No current facility-administered medications for this visit.     Allergies:   Patient has no known allergies.   Social History: Social History   Socioeconomic History  . Marital status: Married    Spouse name: Not on file  . Number of children: 4  . Years of education: Not on file  . Highest education level: Not on file  Occupational History  . Occupation: retired    Fish farm manager: RETIRED  Social  Needs  . Financial resource strain: Not hard at all  . Food insecurity:    Worry: Never true    Inability: Never true  . Transportation needs:    Medical: No    Non-medical: No  Tobacco Use  . Smoking status: Former Smoker    Packs/day: 0.50    Years: 25.00    Pack years: 12.50    Types: Cigarettes    Last attempt to quit: 11/16/1978    Years since quitting: 40.1  . Smokeless tobacco: Former Systems developer    Types: Chew  Substance and Sexual Activity  . Alcohol use: No  . Drug use: No  . Sexual activity: Never  Lifestyle  . Physical activity:    Days per week: 4 days    Minutes per session: 40 min  . Stress: Not at all  Relationships  . Social connections:    Talks on phone: More than three times a week    Gets together: More than three times a week    Attends religious service: More than 4 times per year    Active member of club or organization: Yes    Attends meetings of clubs or organizations: More than 4 times per year    Relationship status: Married  . Intimate partner violence:    Fear of current or ex partner: No    Emotionally abused: No    Physically abused: No    Forced sexual activity: No  Other Topics Concern  . 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     Family History: Family History  Problem Relation Age of Onset  . Diabetes Mother   . Hypertension Mother   . Hypertension Father   . Lung cancer Father   . Cancer Father   . Asthma Sister   . Hypertension Sister   . Arthritis Paternal Grandmother   . Hypertension Brother   . Hypertension Daughter   . Hypertension Son      Review of Systems: All other systems reviewed and are otherwise negative except as noted above.   Physical Exam: VS:  BP 120/88   Pulse 60   Ht 5\' 9"  (1.753 m)   Wt 193 lb (87.5 kg)   SpO2 98%   BMI 28.50 kg/m  , BMI Body mass index is 28.5 kg/m.  GEN- The patient is well appearing, alert and oriented x 3 today.   HEENT: normocephalic,  atraumatic; sclera clear, conjunctiva pink; hearing intact; oropharynx clear; neck supple  Lungs- Clear to ausculation bilaterally, normal work of breathing.  No wheezes, rales, rhonchi Heart- Regular rate and rhythm, no murmurs, rubs or gallops  GI- soft, non-tender, non-distended, bowel sounds present  Extremities- no clubbing, cyanosis, or edema  MS- no significant deformity or atrophy Skin- warm and dry, no rash or lesion; PPM pocket well healed Psych- euthymic mood, full affect Neuro- strength and sensation are intact  PPM Interrogation- reviewed in detail today,  See PACEART report  EKG:  EKG is ordered today. The ekg ordered today shows atrial pacing, intrinsic ventricular conduction, rate 60, PR 163msec, QRS 33msec, QTc 430msec  Recent Labs: 11/02/2018: ALT 22; BUN 15; Creatinine, Ser 0.92; Hemoglobin 15.0; Platelets 192.0; Potassium 4.5; Sodium 141; TSH 0.97   Wt Readings from Last 3 Encounters:  12/30/18 193 lb (87.5 kg)  12/19/18 193 lb (87.5 kg)  11/02/18 188 lb (85.3 kg)     Other studies Reviewed: Additional studies/ records that were reviewed today include:Dr Allred's office notes  Assessment and Plan:  1.  Symptomatic bradycardia Normal PPM function See Pace Art report No changes today Estimated longevity 1 year  2.  Paroxysmal atrial fibrillation Burden by device interrogation <1% Continue Xarelto for CHADS2VASC of 3 Recent CBC, BMET reviewed  3.  HTN Stable No change required today    Current medicines are reviewed at length with the patient today.   The patient does not have concerns regarding his medicines.  The following changes were made today:  none  Labs/ tests ordered today include: none Orders Placed This Encounter  Procedures  . EKG 12-Lead     Disposition:   Follow up with device clinic 6 months, Dr Rayann Heman 1 year     Signed, Chanetta Marshall, NP 12/30/2018 8:47 AM  [address] [place]  [address] [phone] (office) [phone] (fax)

## 2018-12-30 ENCOUNTER — Other Ambulatory Visit: Payer: Self-pay | Admitting: Internal Medicine

## 2018-12-30 ENCOUNTER — Ambulatory Visit (INDEPENDENT_AMBULATORY_CARE_PROVIDER_SITE_OTHER): Payer: Medicare Other | Admitting: Nurse Practitioner

## 2018-12-30 ENCOUNTER — Encounter: Payer: Self-pay | Admitting: Nurse Practitioner

## 2018-12-30 VITALS — BP 120/88 | HR 60 | Ht 69.0 in | Wt 193.0 lb

## 2018-12-30 DIAGNOSIS — I495 Sick sinus syndrome: Secondary | ICD-10-CM | POA: Diagnosis not present

## 2018-12-30 DIAGNOSIS — I480 Paroxysmal atrial fibrillation: Secondary | ICD-10-CM | POA: Diagnosis not present

## 2018-12-30 DIAGNOSIS — I10 Essential (primary) hypertension: Secondary | ICD-10-CM | POA: Diagnosis not present

## 2018-12-30 LAB — CUP PACEART INCLINIC DEVICE CHECK
Date Time Interrogation Session: 20200214084722
Implantable Lead Implant Date: 20100719
Implantable Lead Location: 753859
Implantable Lead Location: 753860
Implantable Lead Model: 4136
Implantable Lead Serial Number: 28594836
Implantable Pulse Generator Implant Date: 20100719
MDC IDC LEAD IMPLANT DT: 20100719
MDC IDC LEAD SERIAL: 28454141
Pulse Gen Serial Number: 764370

## 2018-12-30 NOTE — Patient Instructions (Signed)
Medication Instructions:  NONE If you need a refill on your cardiac medications before your next appointment, please call your pharmacy.   Lab work: NONE If you have labs (blood work) drawn today and your tests are completely normal, you will receive your results only by: Marland Kitchen MyChart Message (if you have MyChart) OR . A paper copy in the mail If you have any lab test that is abnormal or we need to change your treatment, we will call you to review the results.  Testing/Procedures: NONE  Follow-Up: 6 months device clinic  At [facility], you and your health needs are our priority.  As part of our continuing mission to provide you with exceptional heart care, we have created designated Provider Care Teams.  These Care Teams include your primary Cardiologist (physician) and Advanced Practice Providers (APPs -  Physician Assistants and Nurse Practitioners) who all work together to provide you with the care you need, when you need it. You will need a follow up appointment in 1 years.  Please call our office 2 months in advance to schedule this appointment.  You may see Dr Rayann Heman or one of the following Advanced Practice Providers on your designated Care Team:   Chanetta Marshall, NP . Tommye Standard, PA-C  Any Other Special Instructions Will Be Listed Below (If Applicable).

## 2019-01-08 ENCOUNTER — Other Ambulatory Visit: Payer: Self-pay | Admitting: Internal Medicine

## 2019-01-11 ENCOUNTER — Other Ambulatory Visit: Payer: Self-pay | Admitting: Internal Medicine

## 2019-01-11 MED ORDER — TELMISARTAN 20 MG PO TABS: 20.0000 mg | ORAL_TABLET | Freq: Every day | ORAL | 3 refills | Status: DC

## 2019-01-11 NOTE — Telephone Encounter (Signed)
Ok to change to telmisartan

## 2019-01-11 NOTE — Telephone Encounter (Signed)
Product Backordered/Unavailable:BOTH 25MG  AND 50MG  ARE ON BACKORDER, CAN YOU SEND ALTERNATIVE?

## 2019-01-11 NOTE — Telephone Encounter (Signed)
Already address with change to losartan earlier today

## 2019-01-11 NOTE — Addendum Note (Signed)
Addended by: Biagio Borg on: 01/11/2019 12:57 PM   Modules accepted: Orders

## 2019-02-17 ENCOUNTER — Other Ambulatory Visit: Payer: Self-pay | Admitting: Internal Medicine

## 2019-03-05 ENCOUNTER — Encounter (HOSPITAL_BASED_OUTPATIENT_CLINIC_OR_DEPARTMENT_OTHER): Payer: Self-pay | Admitting: Emergency Medicine

## 2019-03-05 ENCOUNTER — Emergency Department (HOSPITAL_BASED_OUTPATIENT_CLINIC_OR_DEPARTMENT_OTHER)
Admission: EM | Admit: 2019-03-05 | Discharge: 2019-03-05 | Disposition: A | Discharge status: Home / Self Care | Payer: Medicare Other | Attending: Emergency Medicine | Admitting: Emergency Medicine

## 2019-03-05 ENCOUNTER — Emergency Department (HOSPITAL_BASED_OUTPATIENT_CLINIC_OR_DEPARTMENT_OTHER): Payer: Medicare Other

## 2019-03-05 ENCOUNTER — Other Ambulatory Visit: Payer: Self-pay

## 2019-03-05 DIAGNOSIS — I110 Hypertensive heart disease with heart failure: Secondary | ICD-10-CM | POA: Diagnosis not present

## 2019-03-05 DIAGNOSIS — Z950 Presence of cardiac pacemaker: Secondary | ICD-10-CM | POA: Diagnosis not present

## 2019-03-05 DIAGNOSIS — R0789 Other chest pain: Secondary | ICD-10-CM | POA: Diagnosis not present

## 2019-03-05 DIAGNOSIS — R0602 Shortness of breath: Secondary | ICD-10-CM | POA: Diagnosis not present

## 2019-03-05 DIAGNOSIS — Z87891 Personal history of nicotine dependence: Secondary | ICD-10-CM | POA: Insufficient documentation

## 2019-03-05 DIAGNOSIS — I10 Essential (primary) hypertension: Secondary | ICD-10-CM | POA: Diagnosis not present

## 2019-03-05 DIAGNOSIS — Z7901 Long term (current) use of anticoagulants: Secondary | ICD-10-CM | POA: Diagnosis not present

## 2019-03-05 DIAGNOSIS — Z79899 Other long term (current) drug therapy: Secondary | ICD-10-CM | POA: Diagnosis not present

## 2019-03-05 DIAGNOSIS — I509 Heart failure, unspecified: Secondary | ICD-10-CM | POA: Diagnosis not present

## 2019-03-05 LAB — CBC WITH DIFFERENTIAL/PLATELET
Abs Immature Granulocytes: 0.02 10*3/uL (ref 0.00–0.07)
Basophils Absolute: 0 10*3/uL (ref 0.0–0.1)
Basophils Relative: 1 %
Eosinophils Absolute: 0 10*3/uL (ref 0.0–0.5)
Eosinophils Relative: 0 %
HCT: 48.5 % (ref 39.0–52.0)
Hemoglobin: 15.6 g/dL (ref 13.0–17.0)
Immature Granulocytes: 0 %
Lymphocytes Relative: 18 %
Lymphs Abs: 1.4 10*3/uL (ref 0.7–4.0)
MCH: 29.8 pg (ref 26.0–34.0)
MCHC: 32.2 g/dL (ref 30.0–36.0)
MCV: 92.6 fL (ref 80.0–100.0)
Monocytes Absolute: 0.7 10*3/uL (ref 0.1–1.0)
Monocytes Relative: 9 %
Neutro Abs: 5.7 10*3/uL (ref 1.7–7.7)
Neutrophils Relative %: 72 %
Platelets: 201 10*3/uL (ref 150–400)
RBC: 5.24 MIL/uL (ref 4.22–5.81)
RDW: 13.2 % (ref 11.5–15.5)
WBC: 7.9 10*3/uL (ref 4.0–10.5)
nRBC: 0 % (ref 0.0–0.2)

## 2019-03-05 LAB — COMPREHENSIVE METABOLIC PANEL
ALT: 37 U/L (ref 0–44)
AST: 32 U/L (ref 15–41)
Albumin: 4.4 g/dL (ref 3.5–5.0)
Alkaline Phosphatase: 76 U/L (ref 38–126)
Anion gap: 7 (ref 5–15)
BUN: 14 mg/dL (ref 8–23)
CO2: 23 mmol/L (ref 22–32)
Calcium: 9.6 mg/dL (ref 8.9–10.3)
Chloride: 106 mmol/L (ref 98–111)
Creatinine, Ser: 0.85 mg/dL (ref 0.61–1.24)
GFR calc Af Amer: >60 mL/min (ref >60)
GFR calc non Af Amer: >60 mL/min (ref >60)
Glucose, Bld: 149 mg/dL — ABNORMAL HIGH (ref 70–99)
Potassium: 4 mmol/L (ref 3.5–5.1)
Sodium: 136 mmol/L (ref 135–145)
Total Bilirubin: 0.8 mg/dL (ref 0.3–1.2)
Total Protein: 7.7 g/dL (ref 6.5–8.1)

## 2019-03-05 LAB — TROPONIN I
Troponin I: <0.03 ng/mL (ref <0.03)
Troponin I: <0.03 ng/mL (ref <0.03)

## 2019-03-05 MED ORDER — ONDANSETRON HCL 4 MG/2ML IJ SOLN
4.0000 mg | Freq: Once | INTRAMUSCULAR | Status: AC
  Administered 2019-03-05: 4 mg via INTRAVENOUS
  Filled 2019-03-05: qty 2

## 2019-03-05 MED ORDER — FAMOTIDINE IN NACL 20-0.9 MG/50ML-% IV SOLN
20.0000 mg | Freq: Once | INTRAVENOUS | Status: AC
  Administered 2019-03-05: 18:00:00 20 mg via INTRAVENOUS
  Filled 2019-03-05: qty 50

## 2019-03-05 NOTE — ED Notes (Signed)
Tech here to check pacemaker.

## 2019-03-05 NOTE — Discharge Instructions (Signed)
Continue your current meds.   Add pepcid 20 mg twice daily as needed.   See your doctor for follow up next week   Return to ER if you have worse chest pain, shortness of breath, fever

## 2019-03-05 NOTE — ED Notes (Signed)
Pt states today "I just feel bad", "my hands and feet have been sweating". Reports SOB with exertion and fatigue. Denies sick contacts

## 2019-03-05 NOTE — ED Notes (Addendum)
Spoke with [employer] from [employer] [phone]). She is paging rep for pacemaker interrogation

## 2019-03-05 NOTE — ED Provider Notes (Signed)
[facility] Provider Note   CSN: [id number] Arrival date & time: 03/05/19  1616    History   Chief Complaint Chief Complaint  Patient presents with   Shortness of Breath    HPI [name] is a 78 y.o. male history of hypertension, hyperlipidemia, sick sinus syndrome status post [employer] pacemaker, here presenting with nausea, dizziness, shortness of breath.  Patient states that he woke up this morning he felt sweaty in his arms and legs.  He then has some nausea and dizziness and some shortness of breath with exertion and felt tired.  Patient denies actual chest pain.  Patient states that he did not notice any palpitations and his pacemaker did not go off.  Patient states that he is on Xarelto currently for A. Fib.   Patient denies any recent travel or sick contacts.  In particular, patient does not have any known contacts with COVID 19.      The history is provided by the patient.    Past Medical History:  Diagnosis Date   Abdominal pain, epigastric    Allergic rhinitis, cause unspecified    Arthritis    BPH (benign prostatic hyperplasia)    CHF (congestive heart failure) (HCC)    Chronic anticoagulation    xarelto   Coronary artery calcification seen on CAT scan 12/26/2012   Depressive disorder, not elsewhere classified    Diverticulosis of colon (without mention of hemorrhage)    Esophageal reflux    High risk medication use    Hyperlipidemia    Hyperplastic rectal polyp    Hypertension    Lumbago    Pacemaker    Paralyzed hemidiaphragm    LEFT   Persistent atrial fibrillation    Sick sinus syndrome [facility]) July 2010   s/p PPM [facility]) by [facility]    Patient Active Problem List   Diagnosis Date Noted   Strain of lumbar region 02/10/2018   Hematuria 11/01/2017   Nausea vomiting and diarrhea 05/08/2017   Lumbar radiculopathy 12/01/2016   Dizziness 11/18/2016   Nocturia 11/18/2016    Hyperglycemia 11/18/2016   Bilateral hip pain 10/20/2016   Bilateral ankle pain 10/20/2016   Bilateral hand pain 10/20/2016   Fatigue 04/28/2016   Acute sinus infection 01/28/2016   Cough 01/28/2016   Chronic pain 07/11/2015   Alcohol abuse 07/01/2015   Increased prostate specific antigen (PSA) velocity 06/26/2014   Neck mass 12/26/2013   Arthritis of ankle or foot, degenerative 12/16/2013   Retrocalcaneal bursitis 12/16/2013   Abdominal pain 11/14/2013   Near syncope 05/06/2013   abnormal (EKG) 05/06/2013   Dry mouth 03/02/2013   Coronary artery calcification seen on CAT scan 12/26/2012   Lumbar back pain 12/26/2012   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   Leg pain, right 04/17/2012   Leg swelling 04/17/2012   DOE (dyspnea on exertion) 02/26/2012   Preventative health care 12/24/2011   Cervical radiculopathy 12/24/2011   Shortness of breath 12/18/2010   SYNCOPE 12/04/2010   FREQUENCY, URINARY 11/24/2010   NECK PAIN, RIGHT 08/21/2010   Atrial fibrillation ([place]) 05/29/2010   Sick sinus syndrome ([place]) 05/20/2010   NAUSEA AND VOMITING 05/20/2010   Rhabdomyolysis 03/25/2010   SHOULDER PAIN, RIGHT 12/09/2009   ACHILLES TENDINITIS 12/09/2009   BPH (benign prostatic hyperplasia) 06/26/2009   WEIGHT LOSS-ABNORMAL 06/05/2009   NAUSEA 06/05/2009   PERSONAL HX COLONIC POLYPS 06/05/2009   BRADYCARDIA 05/27/2009   FATIGUE 05/27/2009   Hyperlipidemia 05/30/2008   Depression 05/30/2008  Essential hypertension 05/30/2008   ALLERGIC RHINITIS 05/30/2008   GERD 05/30/2008   Diverticulosis of colon (without mention of hemorrhage) 05/30/2008   OVERACTIVE BLADDER 05/30/2008   LOW BACK PAIN 05/30/2008    Past Surgical History:  Procedure Laterality Date   ESOPHAGOGASTRODUODENOSCOPY N/A 12/05/2013   Procedure: ESOPHAGOGASTRODUODENOSCOPY (EGD);  Surgeon: [name], MD;  Location: [facility];  Service: Endoscopy;  Laterality: N/A;    ESOPHAGOGASTRODUODENOSCOPY N/A 07/04/2015   Procedure: ESOPHAGOGASTRODUODENOSCOPY (EGD);  Surgeon: [name], MD;  Location: [place];  Service: Endoscopy;  Laterality: N/A;   INSERT / REPLACE / REMOVE PACEMAKER  05/2009    [employer] ALTRUA 60  (serial number [id number]) dual-chamber pacemaker   PACEMAKER PLACEMENT Bilateral    '05        Home Medications    Prior to Admission medications   Medication Sig Start Date End Date Taking? Authorizing Provider  acetaminophen (TYLENOL ARTHRITIS PAIN) 650 MG CR tablet Take 650 mg by mouth every 8 (eight) hours as needed for pain.    [provider]  amLODipine (NORVASC) 5 MG tablet TAKE 1 TABLET BY MOUTH EVERY DAY 12/30/18   Biagio Borg, MD  atorvastatin (LIPITOR) 80 MG tablet TAKE 1 TABLET BY MOUTH EVERY DAY 01/11/19   Biagio Borg, MD  diclofenac sodium (VOLTAREN) 1 % GEL APPLY 4 GRAMS TOPICALLY 4 TIMES A DAY AS NEEDED 01/03/18   Biagio Borg, MD  fexofenadine (ALLEGRA) 180 MG tablet TAKE 1 TABLET (180 MG TOTAL) BY MOUTH DAILY. 12/10/15   Biagio Borg, MD  flecainide (TAMBOCOR) 100 MG tablet TAKE 1 TABLET BY MOUTH TWICE A DAY 12/19/18   Biagio Borg, MD  latanoprost (XALATAN) 0.005 % ophthalmic solution Place 1 drop into both eyes daily.    [provider]  meloxicam (MOBIC) 7.5 MG tablet TAKE 1 TABLET BY MOUTH DAILY AS NEEDED FOR PAIN 11/26/18   Biagio Borg, MD  omeprazole (PRILOSEC) 40 MG capsule TAKE 1 CAPSULE BY MOUTH EVERY DAY 10/24/18   Biagio Borg, MD  oxybutynin (DITROPAN-XL) 10 MG 24 hr tablet TAKE 1 TABLET (10 MG TOTAL) BY MOUTH DAILY. 08/02/15   Biagio Borg, MD  tamsulosin (FLOMAX) 0.4 MG CAPS capsule TAKE 1 CAPSULE BY MOUTH TWICE A DAY 09/15/18   Biagio Borg, MD  telmisartan (MICARDIS) 20 MG tablet Take 1 tablet (20 mg total) by mouth daily. 01/11/19   Biagio Borg, MD  tiZANidine (ZANAFLEX) 2 MG tablet TAKE 1 TABLET (2 MG TOTAL) BY MOUTH EVERY 6 (SIX) HOURS AS NEEDED FOR MUSCLE SPASMS. 02/17/19    Biagio Borg, MD  traMADol (ULTRAM) 50 MG tablet TAKE 1 TABLET (50 MG TOTAL) BY MOUTH EVERY 6 (SIX) HOURS AS NEEDED. FOR PAIN 12/19/18   Biagio Borg, MD  XARELTO 20 MG TABS tablet TAKE 1 TABLET BY MOUTH EVERY DAY 01/09/19   Biagio Borg, MD    Family History Family History  Problem Relation Age of Onset   Diabetes Mother    Hypertension Mother    Hypertension Father    Lung cancer Father    Cancer Father    Asthma Sister    Hypertension Sister    Arthritis Paternal Grandmother    Hypertension Brother    Hypertension Daughter    Hypertension Son     Social History Social History   Tobacco Use   Smoking status: Former Smoker    Packs/day: 0.50    Years: 25.00  Pack years: 12.50    Types: Cigarettes    Last attempt to quit: 11/16/1978    Years since quitting: 40.3   Smokeless tobacco: Former Systems developer    Types: Chew  Substance Use Topics   Alcohol use: No   Drug use: No     Allergies   Patient has no known allergies.   Review of Systems Review of Systems  Respiratory: Positive for shortness of breath.   All other systems reviewed and are negative.    Physical Exam Updated Vital Signs BP (!) 161/85    Pulse 77    Temp 98.4 F (36.9 C) (Oral)    Resp 15    Ht 5\' 9"  (1.753 m)    Wt 83.9 kg    SpO2 100%    BMI 27.32 kg/m   Physical Exam Vitals signs and nursing note reviewed.  HENT:     Head: Normocephalic.     Mouth/Throat:     Mouth: Mucous membranes are moist.  Eyes:     Extraocular Movements: Extraocular movements intact.     Pupils: Pupils are equal, round, and reactive to light.  Neck:     Musculoskeletal: Normal range of motion and neck supple.  Cardiovascular:     Rate and Rhythm: Normal rate and regular rhythm.  Pulmonary:     Effort: Pulmonary effort is normal.     Breath sounds: Normal breath sounds.  Abdominal:     General: Bowel sounds are normal.     Palpations: Abdomen is soft.  Musculoskeletal: Normal range of motion.    Skin:    General: Skin is warm.     Capillary Refill: Capillary refill takes less than 2 seconds.  Neurological:     General: No focal deficit present.     Mental Status: He is alert and oriented to person, place, and time.  Psychiatric:        Mood and Affect: Mood normal.        Behavior: Behavior normal.      ED Treatments / Results  Labs (all labs ordered are listed, but only abnormal results are displayed) Labs Reviewed  COMPREHENSIVE METABOLIC PANEL - Abnormal; Notable for the following components:      Result Value   Glucose, Bld 149 (*)    All other components within normal limits  CBC WITH DIFFERENTIAL/PLATELET  TROPONIN I  TROPONIN I    EKG EKG Interpretation  Date/Time:  Sunday March 05 2019 16:28:21 EDT Ventricular Rate:  81 PR Interval:    QRS Duration: 105 QT Interval:  351 QTC Calculation: 408 R Axis:   -25 Text Interpretation:  Atrial-paced rhythm LVH with secondary repolarization abnormality Baseline wander in lead(s) II aVF No significant change since last tracing Confirmed by Wandra Arthurs ([id number]) on 03/05/2019 4:57:13 PM   Radiology Dg Chest Port 1 View  Result Date: 03/05/2019 CLINICAL DATA:  78 year old male with shortness of breath and fatigue. EXAM: PORTABLE CHEST 1 VIEW COMPARISON:  05/10/2016 and earlier. FINDINGS: Portable AP semi upright view at 1654 hours. Chronic mild elevation of the left hemidiaphragm and streaky left lung base opacity. Stable lung volumes and ventilation since 2017. No pneumothorax, pulmonary edema or definite pleural effusion. Chronic left chest dual lead cardiac pacemaker. Normal cardiac size and mediastinal contours. Visualized tracheal air column is within normal limits. Negative visible bowel gas pattern. IMPRESSION: Chronic left lung base scarring or atelectasis. No acute cardiopulmonary abnormality. Electronically Signed   By: Herminio Heads.D.  On: 03/05/2019 17:29    Procedures Procedures (including critical care  time)  Medications Ordered in ED Medications  ondansetron (ZOFRAN) injection 4 mg (4 mg Intravenous Given 03/05/19 1756)  famotidine (PEPCID) IVPB 20 mg premix (0 mg Intravenous Stopped 03/05/19 1904)     Initial Impression / Assessment and Plan / ED Course  I have reviewed the triage vital signs and the nursing notes.  Pertinent labs & imaging results that were available during my care of the patient were reviewed by me and considered in my medical decision making (see chart for details).       JAIYDEN LAUR is a 78 y.o. male here with SOB, fatigue.  Patient has no fever and is not hypoxic.  Patient has no known CAD or stents.  He does have history of sick sinus syndrome and does have a pacemaker.  Consider arrhythmia versus ACS.  Patient is on Xarelto and I doubt PE.  Will get trop x 2, labs, CXR. Will interrogate pacemaker   9:05 PM Pacemaker interrogated and there was no events.  His labs were unremarkable and trop neg x 2. Given pepcid and felt better. No pain on discharge. Recommend close follow up with PCP.   [name] was evaluated in Emergency Department on 03/05/2019 for the symptoms described in the history of present illness. He was evaluated in the context of the global COVID-19 pandemic, which necessitated consideration that the patient might be at risk for infection with the SARS-CoV-2 virus that causes COVID-19. Institutional protocols and algorithms that pertain to the evaluation of patients at risk for COVID-19 are in a state of rapid change based on information released by regulatory bodies including the CDC and federal and state organizations. These policies and algorithms were followed during the patient's care in the ED.    Final Clinical Impressions(s) / ED Diagnoses   Final diagnoses:  Other chest pain  SOB (shortness of breath)    ED Discharge Orders    None       Drenda Freeze, MD 03/05/19 2106

## 2019-03-05 NOTE — ED Notes (Signed)
ED Provider at bedside. 

## 2019-03-05 NOTE — ED Triage Notes (Signed)
Reports shortness of breath x 3 days.  Denies chest pain.  Reports when he had pacemaker put in that it affected his left lung and causes him to be short of breath intermittently.  Ambulatory to room in NAD.

## 2019-03-06 ENCOUNTER — Ambulatory Visit (INDEPENDENT_AMBULATORY_CARE_PROVIDER_SITE_OTHER): Payer: Medicare Other | Admitting: Internal Medicine

## 2019-03-06 ENCOUNTER — Encounter: Payer: Self-pay | Admitting: Internal Medicine

## 2019-03-06 DIAGNOSIS — R739 Hyperglycemia, unspecified: Secondary | ICD-10-CM

## 2019-03-06 DIAGNOSIS — J309 Allergic rhinitis, unspecified: Secondary | ICD-10-CM

## 2019-03-06 DIAGNOSIS — J069 Acute upper respiratory infection, unspecified: Secondary | ICD-10-CM

## 2019-03-06 MED ORDER — PREDNISONE 10 MG PO TABS: ORAL_TABLET | ORAL | 0 refills | Status: DC

## 2019-03-06 MED ORDER — TRIAMCINOLONE ACETONIDE 55 MCG/ACT NA AERO: 2.0000 | INHALATION_SPRAY | Freq: Every day | NASAL | 12 refills | Status: DC

## 2019-03-06 MED ORDER — AZITHROMYCIN 250 MG PO TABS: ORAL_TABLET | ORAL | 0 refills | Status: DC

## 2019-03-06 NOTE — Assessment & Plan Note (Signed)
stable overall by history and exam, recent data reviewed with pt, and pt to continue medical treatment as before,  to f/u any worsening symptoms or concerns  

## 2019-03-06 NOTE — Assessment & Plan Note (Signed)
Mild to mod, for antibx course,  to f/u any worsening symptoms or concerns 

## 2019-03-06 NOTE — Progress Notes (Signed)
Patient ID: [name], male   DOB: Apr 12, 1941, 78 y.o.   MRN: [id number]  Virtual Visit via Video Note  I connected with [name] on 03/06/19 at  1:00 PM EDT by a video enabled telemedicine application and verified that I am speaking with the correct person using two identifiers. Pt is at home, I am in office, no other persons present   I discussed the limitations of evaluation and management by telemedicine and the availability of in person appointments. The patient expressed understanding and agreed to proceed.  History of Present Illness:  Here with 2-3 days acute onset fever, facial pain, pressure, headache, general weakness and malaise, and greenish d/c, with mild ST and cough, but pt denies chest pain, wheezing, increased sob or doe, orthopnea, PND, increased LE swelling, palpitations, dizziness or syncope.  Does have several wks ongoing nasal allergy symptoms with clearish congestion, itch and sneezing, without fever, pain, ST, cough, swelling or wheezing.   Pt denies polydipsia, polyuria.   Past Medical History:  Diagnosis Date  . Abdominal pain, epigastric   . Allergic rhinitis, cause unspecified   . Arthritis   . BPH (benign prostatic hyperplasia)   . CHF (congestive heart failure) ([place])   . Chronic anticoagulation    xarelto  . Coronary artery calcification seen on CAT scan 12/26/2012  . Depressive disorder, not elsewhere classified   . Diverticulosis of colon (without mention of hemorrhage)   . Esophageal reflux   . High risk medication use   . Hyperlipidemia   . Hyperplastic rectal polyp   . Hypertension   . Lumbago   . Pacemaker   . Paralyzed hemidiaphragm    LEFT  . Persistent atrial fibrillation   . Sick sinus syndrome [facility]) July 2010   s/p PPM [facility]) by [name]   Past Surgical History:  Procedure Laterality Date  . ESOPHAGOGASTRODUODENOSCOPY N/A 12/05/2013   Procedure: ESOPHAGOGASTRODUODENOSCOPY (EGD);  Surgeon: [name], MD;  Location: [facility];  Service: Endoscopy;  Laterality: N/A;  . ESOPHAGOGASTRODUODENOSCOPY N/A 07/04/2015   Procedure: ESOPHAGOGASTRODUODENOSCOPY (EGD);  Surgeon: [name], MD;  Location: [place];  Service: Endoscopy;  Laterality: N/A;  . INSERT / REPLACE / REMOVE PACEMAKER  05/2009    [employer] ALTRUA 60  (serial number [id number]) dual-chamber pacemaker  . PACEMAKER PLACEMENT Bilateral    '05    reports that he quit smoking about 40 years ago. His smoking use included cigarettes. He has a 12.50 pack-year smoking history. He has quit using smokeless tobacco.  His smokeless tobacco use included chew. He reports that he does not drink alcohol or use drugs. family history includes Arthritis in his paternal grandmother; Asthma in his sister; Cancer in his father; Diabetes in his mother; Hypertension in his brother, daughter, father, mother, sister, and son; Lung cancer in his father. No Known Allergies Current Outpatient Medications on File Prior to Visit  Medication Sig Dispense Refill  . acetaminophen (TYLENOL ARTHRITIS PAIN) 650 MG CR tablet Take 650 mg by mouth every 8 (eight) hours as needed for pain.    Marland Kitchen amLODipine (NORVASC) 5 MG tablet TAKE 1 TABLET BY MOUTH EVERY DAY 90 tablet 1  . atorvastatin (LIPITOR) 80 MG tablet TAKE 1 TABLET BY MOUTH EVERY DAY 90 tablet 3  . diclofenac sodium (VOLTAREN) 1 % GEL APPLY 4 GRAMS TOPICALLY 4 TIMES A DAY AS NEEDED 400 g 4  . fexofenadine (ALLEGRA) 180 MG tablet TAKE 1 TABLET (180 MG TOTAL) BY MOUTH DAILY.  90 tablet 1  . flecainide (TAMBOCOR) 100 MG tablet TAKE 1 TABLET BY MOUTH TWICE A DAY 180 tablet 3  . latanoprost (XALATAN) 0.005 % ophthalmic solution Place 1 drop into both eyes daily.    . meloxicam (MOBIC) 7.5 MG tablet TAKE 1 TABLET BY MOUTH DAILY AS NEEDED FOR PAIN 30 tablet 2  . omeprazole (PRILOSEC) 40 MG capsule TAKE 1 CAPSULE BY MOUTH EVERY DAY 90 capsule 1  . oxybutynin (DITROPAN-XL) 10 MG 24 hr tablet TAKE 1 TABLET (10 MG TOTAL) BY MOUTH  DAILY. 90 tablet 1  . tamsulosin (FLOMAX) 0.4 MG CAPS capsule TAKE 1 CAPSULE BY MOUTH TWICE A DAY 180 capsule 1  . telmisartan (MICARDIS) 20 MG tablet Take 1 tablet (20 mg total) by mouth daily. 90 tablet 3  . tiZANidine (ZANAFLEX) 2 MG tablet TAKE 1 TABLET (2 MG TOTAL) BY MOUTH EVERY 6 (SIX) HOURS AS NEEDED FOR MUSCLE SPASMS. 50 tablet 2  . traMADol (ULTRAM) 50 MG tablet TAKE 1 TABLET (50 MG TOTAL) BY MOUTH EVERY 6 (SIX) HOURS AS NEEDED. FOR PAIN 120 tablet 2  . XARELTO 20 MG TABS tablet TAKE 1 TABLET BY MOUTH EVERY DAY 90 tablet 3   No current facility-administered medications on file prior to visit.     Observations/Objective: Mild ill appaering, alert, mentating well, appropriate mood and affect, o/w NAD, resps normal, cn 2-12 intact, moves all 4s, no rash or swelling noted Past Medical History:  Diagnosis Date  . Abdominal pain, epigastric   . Allergic rhinitis, cause unspecified   . Arthritis   . BPH (benign prostatic hyperplasia)   . CHF (congestive heart failure) ([place])   . Chronic anticoagulation    xarelto  . Coronary artery calcification seen on CAT scan 12/26/2012  . Depressive disorder, not elsewhere classified   . Diverticulosis of colon (without mention of hemorrhage)   . Esophageal reflux   . High risk medication use   . Hyperlipidemia   . Hyperplastic rectal polyp   . Hypertension   . Lumbago   . Pacemaker   . Paralyzed hemidiaphragm    LEFT  . Persistent atrial fibrillation   . Sick sinus syndrome [facility]) July 2010   s/p PPM [facility]) by [name]   Past Surgical History:  Procedure Laterality Date  . ESOPHAGOGASTRODUODENOSCOPY N/A 12/05/2013   Procedure: ESOPHAGOGASTRODUODENOSCOPY (EGD);  Surgeon: [name], MD;  [facility];  Service: Endoscopy;  Laterality: N/A;  . ESOPHAGOGASTRODUODENOSCOPY N/A 07/04/2015   Procedure: ESOPHAGOGASTRODUODENOSCOPY (EGD);  Surgeon: [name], MD;  Location: [place];  Service: Endoscopy;  Laterality:  N/A;  . INSERT / REPLACE / REMOVE PACEMAKER  05/2009    [employer] ALTRUA 60  (serial number [id number]) dual-chamber pacemaker  . PACEMAKER PLACEMENT Bilateral    '05    reports that he quit smoking about 40 years ago. His smoking use included cigarettes. He has a 12.50 pack-year smoking history. He has quit using smokeless tobacco.  His smokeless tobacco use included chew. He reports that he does not drink alcohol or use drugs. family history includes Arthritis in his paternal grandmother; Asthma in his sister; Cancer in his father; Diabetes in his mother; Hypertension in his brother, daughter, father, mother, sister, and son; Lung cancer in his father. No Known Allergies Current Outpatient Medications on File Prior to Visit  Medication Sig Dispense Refill  . acetaminophen (TYLENOL ARTHRITIS PAIN) 650 MG CR tablet Take 650 mg by mouth every 8 (eight) hours as needed for pain.    Marland Kitchen  amLODipine (NORVASC) 5 MG tablet TAKE 1 TABLET BY MOUTH EVERY DAY 90 tablet 1  . atorvastatin (LIPITOR) 80 MG tablet TAKE 1 TABLET BY MOUTH EVERY DAY 90 tablet 3  . diclofenac sodium (VOLTAREN) 1 % GEL APPLY 4 GRAMS TOPICALLY 4 TIMES A DAY AS NEEDED 400 g 4  . fexofenadine (ALLEGRA) 180 MG tablet TAKE 1 TABLET (180 MG TOTAL) BY MOUTH DAILY. 90 tablet 1  . flecainide (TAMBOCOR) 100 MG tablet TAKE 1 TABLET BY MOUTH TWICE A DAY 180 tablet 3  . latanoprost (XALATAN) 0.005 % ophthalmic solution Place 1 drop into both eyes daily.    . meloxicam (MOBIC) 7.5 MG tablet TAKE 1 TABLET BY MOUTH DAILY AS NEEDED FOR PAIN 30 tablet 2  . omeprazole (PRILOSEC) 40 MG capsule TAKE 1 CAPSULE BY MOUTH EVERY DAY 90 capsule 1  . oxybutynin (DITROPAN-XL) 10 MG 24 hr tablet TAKE 1 TABLET (10 MG TOTAL) BY MOUTH DAILY. 90 tablet 1  . tamsulosin (FLOMAX) 0.4 MG CAPS capsule TAKE 1 CAPSULE BY MOUTH TWICE A DAY 180 capsule 1  . telmisartan (MICARDIS) 20 MG tablet Take 1 tablet (20 mg total) by mouth daily. 90 tablet 3  . tiZANidine  (ZANAFLEX) 2 MG tablet TAKE 1 TABLET (2 MG TOTAL) BY MOUTH EVERY 6 (SIX) HOURS AS NEEDED FOR MUSCLE SPASMS. 50 tablet 2  . traMADol (ULTRAM) 50 MG tablet TAKE 1 TABLET (50 MG TOTAL) BY MOUTH EVERY 6 (SIX) HOURS AS NEEDED. FOR PAIN 120 tablet 2  . XARELTO 20 MG TABS tablet TAKE 1 TABLET BY MOUTH EVERY DAY 90 tablet 3   No current facility-administered medications on file prior to visit.    Assessment and Plan: See notes  Follow Up Instructions: See notes   I discussed the assessment and treatment plan with the patient. The patient was provided an opportunity to ask questions and all were answered. The patient agreed with the plan and demonstrated an understanding of the instructions.   The patient was advised to call back or seek an in-person evaluation if the symptoms worsen or if the condition fails to improve as anticipated.   Cathlean Cower, MD

## 2019-03-06 NOTE — Patient Instructions (Signed)
Please take all new medication as prescribed  Please continue all other medications as before, and refills have been done if requested.  Please have the pharmacy call with any other refills you may need.  Please continue your efforts at being more active, low cholesterol diet, and weight control.  Please keep your appointments with your specialists as you may have planned    

## 2019-03-06 NOTE — Assessment & Plan Note (Signed)
Mild to mod with seasonal flare, for predpac asd, nasacort asd,  to f/u any worsening symptoms or concerns

## 2019-03-09 ENCOUNTER — Other Ambulatory Visit: Payer: Self-pay | Admitting: Internal Medicine

## 2019-03-10 ENCOUNTER — Telehealth: Payer: Self-pay

## 2019-03-10 MED ORDER — AZITHROMYCIN 250 MG PO TABS: ORAL_TABLET | ORAL | 0 refills | Status: DC

## 2019-03-10 NOTE — Telephone Encounter (Signed)
Ok I resent the antibiotic

## 2019-03-10 NOTE — Telephone Encounter (Signed)
Copied from [place] [id number]. Topic: Quick Communication - See Telephone Encounter >> Mar 09, 2019  5:03 PM [place] wrote: CRM for notification. See Telephone encounter for: 03/09/19.zithromycin (ZITHROMAX Z-PAK) 250 MG tablet PT called and said that the pharmacy has sent a couple request with the clarification for dispersing.Please be advised of another incoming request.   Dr. Jenny Reichmann I have not seen any clarification request via fax. Please double check antibiotic directions/quantity and I will reach out to the pharmacy. Thanks.

## 2019-03-10 NOTE — Addendum Note (Signed)
Addended by: Biagio Borg on: 03/10/2019 09:43 AM   Modules accepted: Orders

## 2019-03-15 ENCOUNTER — Other Ambulatory Visit: Payer: Self-pay | Admitting: Internal Medicine

## 2019-04-14 ENCOUNTER — Other Ambulatory Visit: Payer: Self-pay | Admitting: Internal Medicine

## 2019-04-22 ENCOUNTER — Other Ambulatory Visit: Payer: Self-pay | Admitting: Internal Medicine

## 2019-04-27 ENCOUNTER — Other Ambulatory Visit: Payer: Self-pay | Admitting: Internal Medicine

## 2019-04-27 NOTE — Telephone Encounter (Signed)
Done erx 

## 2019-05-10 ENCOUNTER — Other Ambulatory Visit: Payer: Self-pay | Admitting: Pediatric Intensive Care

## 2019-05-10 DIAGNOSIS — Z20822 Contact with and (suspected) exposure to covid-19: Secondary | ICD-10-CM

## 2019-05-10 DIAGNOSIS — R6889 Other general symptoms and signs: Secondary | ICD-10-CM | POA: Diagnosis not present

## 2019-05-17 ENCOUNTER — Telehealth: Payer: Self-pay

## 2019-05-17 LAB — NOVEL CORONAVIRUS, NAA: SARS-CoV-2, NAA: NOT DETECTED

## 2019-05-17 NOTE — Telephone Encounter (Signed)
Called pt, LVM.   CRM created.  

## 2019-05-17 NOTE — Telephone Encounter (Signed)
-----   Message from [place] sent at 05/17/2019  1:57 PM EDT ----- Attempted to reach pt, no answer. Left vm to call back. Forwarding result note to pcp's office to follow up on

## 2019-05-17 NOTE — Telephone Encounter (Signed)
Patient calling back for results. NT unavailable.

## 2019-05-17 NOTE — Telephone Encounter (Signed)
Charted in result notes. 

## 2019-06-08 ENCOUNTER — Other Ambulatory Visit: Payer: Self-pay | Admitting: Internal Medicine

## 2019-06-09 ENCOUNTER — Other Ambulatory Visit: Payer: Self-pay | Admitting: Internal Medicine

## 2019-06-19 DIAGNOSIS — H401131 Primary open-angle glaucoma, bilateral, mild stage: Secondary | ICD-10-CM | POA: Diagnosis not present

## 2019-06-19 DIAGNOSIS — H2513 Age-related nuclear cataract, bilateral: Secondary | ICD-10-CM | POA: Diagnosis not present

## 2019-07-16 DIAGNOSIS — Z23 Encounter for immunization: Secondary | ICD-10-CM | POA: Diagnosis not present

## 2019-07-25 DIAGNOSIS — H401131 Primary open-angle glaucoma, bilateral, mild stage: Secondary | ICD-10-CM | POA: Diagnosis not present

## 2019-08-04 ENCOUNTER — Other Ambulatory Visit: Payer: Self-pay | Admitting: Internal Medicine

## 2019-09-05 ENCOUNTER — Ambulatory Visit (INDEPENDENT_AMBULATORY_CARE_PROVIDER_SITE_OTHER): Payer: Medicare Other | Admitting: Student

## 2019-09-05 ENCOUNTER — Ambulatory Visit
Admission: RE | Admit: 2019-09-05 | Discharge: 2019-09-05 | Disposition: A | Discharge status: Home / Self Care | Payer: Medicare Other | Source: Ambulatory Visit | Attending: Student | Admitting: Student

## 2019-09-05 ENCOUNTER — Other Ambulatory Visit: Payer: Self-pay

## 2019-09-05 DIAGNOSIS — T829XXA Unspecified complication of cardiac and vascular prosthetic device, implant and graft, initial encounter: Secondary | ICD-10-CM | POA: Diagnosis not present

## 2019-09-05 DIAGNOSIS — T82897A Other specified complication of cardiac prosthetic devices, implants and grafts, initial encounter: Secondary | ICD-10-CM | POA: Diagnosis not present

## 2019-09-05 DIAGNOSIS — I495 Sick sinus syndrome: Secondary | ICD-10-CM

## 2019-09-05 LAB — CUP PACEART INCLINIC DEVICE CHECK
Brady Statistic RA Percent Paced: 88 %
Brady Statistic RV Percent Paced: 16 %
Date Time Interrogation Session: 20201020040000
Implantable Lead Implant Date: 20100719
Implantable Lead Implant Date: 20100719
Implantable Lead Location: 753859
Implantable Lead Location: 753860
Implantable Lead Model: 4136
Implantable Lead Model: 4137
Implantable Lead Serial Number: 28454141
Implantable Lead Serial Number: 28594836
Implantable Pulse Generator Implant Date: 20100719
Lead Channel Impedance Value: 2500 Ohm
Lead Channel Impedance Value: 550 Ohm
Lead Channel Pacing Threshold Amplitude: 0.5 V
Lead Channel Pacing Threshold Amplitude: 0.8 V
Lead Channel Pacing Threshold Amplitude: 2 V
Lead Channel Pacing Threshold Amplitude: 3.3 V
Lead Channel Pacing Threshold Amplitude: 3.3 V
Lead Channel Pacing Threshold Amplitude: 4 V
Lead Channel Pacing Threshold Amplitude: 4.5 V
Lead Channel Pacing Threshold Pulse Width: 0.4 ms
Lead Channel Pacing Threshold Pulse Width: 0.4 ms
Lead Channel Pacing Threshold Pulse Width: 1 ms
Lead Channel Pacing Threshold Pulse Width: 1 ms
Lead Channel Pacing Threshold Pulse Width: 1 ms
Lead Channel Pacing Threshold Pulse Width: 1 ms
Lead Channel Pacing Threshold Pulse Width: 1 ms
Lead Channel Sensing Intrinsic Amplitude: 3.2 mV
Lead Channel Setting Pacing Amplitude: 2 V
Lead Channel Setting Pacing Amplitude: 6.5 V
Lead Channel Setting Pacing Pulse Width: 1 ms
Lead Channel Setting Sensing Sensitivity: 2.5 mV
Pulse Gen Serial Number: 764370

## 2019-09-05 NOTE — Progress Notes (Signed)
Pacemaker check in clinic. RA threshold, sensing, impedance consistent with previous measurements. Alert for high RV impedance, switch to Unipolar, and "VT". Pt with a spike in RV impedance in August, which then bottomed out, and then came back up to >2500 in the middle of September. Pt denies any injury or trauma to the area.  He feels like he felt a sharp pain in his upper left chest a few weeks ago, but thought he was bit by something. No swelling or obvious deformity noted. VP 16%. Intact A-V conduction today with AP. VT episodes reviewed that show RV noise. Not reproducible today with isometrics. Discussed with [facility] Rep and Dr. Lovena Le in office. RV output increased to 6.5V with intermittent capture ~4.0 V at 1.0 ms. Histogram distribution appropriate for patient activity level. CXR ordered for today. Estimated longevity 1 year. No TTMs. Device not remote capable. Will make follow up based on CXR and plan from Dr. Rayann Heman.  [name] [address]" [place], PA-C  09/05/2019 9:26 AM

## 2019-09-05 NOTE — Patient Instructions (Signed)
Go to [facility]) for a chest x ray.

## 2019-09-06 ENCOUNTER — Other Ambulatory Visit: Payer: Self-pay

## 2019-09-06 ENCOUNTER — Emergency Department (HOSPITAL_BASED_OUTPATIENT_CLINIC_OR_DEPARTMENT_OTHER)
Admission: EM | Admit: 2019-09-06 | Discharge: 2019-09-06 | Disposition: A | Discharge status: Home / Self Care | Payer: Medicare Other | Attending: Emergency Medicine | Admitting: Emergency Medicine

## 2019-09-06 ENCOUNTER — Encounter (HOSPITAL_BASED_OUTPATIENT_CLINIC_OR_DEPARTMENT_OTHER): Payer: Self-pay | Admitting: *Deleted

## 2019-09-06 ENCOUNTER — Telehealth: Payer: Self-pay | Admitting: Student

## 2019-09-06 ENCOUNTER — Emergency Department (HOSPITAL_BASED_OUTPATIENT_CLINIC_OR_DEPARTMENT_OTHER): Payer: Medicare Other

## 2019-09-06 DIAGNOSIS — Y929 Unspecified place or not applicable: Secondary | ICD-10-CM | POA: Diagnosis not present

## 2019-09-06 DIAGNOSIS — Z79899 Other long term (current) drug therapy: Secondary | ICD-10-CM | POA: Diagnosis not present

## 2019-09-06 DIAGNOSIS — I110 Hypertensive heart disease with heart failure: Secondary | ICD-10-CM | POA: Diagnosis not present

## 2019-09-06 DIAGNOSIS — W01198A Fall on same level from slipping, tripping and stumbling with subsequent striking against other object, initial encounter: Secondary | ICD-10-CM | POA: Diagnosis not present

## 2019-09-06 DIAGNOSIS — I2510 Atherosclerotic heart disease of native coronary artery without angina pectoris: Secondary | ICD-10-CM | POA: Diagnosis not present

## 2019-09-06 DIAGNOSIS — Z87891 Personal history of nicotine dependence: Secondary | ICD-10-CM | POA: Insufficient documentation

## 2019-09-06 DIAGNOSIS — Y999 Unspecified external cause status: Secondary | ICD-10-CM | POA: Diagnosis not present

## 2019-09-06 DIAGNOSIS — S01112A Laceration without foreign body of left eyelid and periocular area, initial encounter: Secondary | ICD-10-CM | POA: Diagnosis not present

## 2019-09-06 DIAGNOSIS — Z950 Presence of cardiac pacemaker: Secondary | ICD-10-CM | POA: Diagnosis not present

## 2019-09-06 DIAGNOSIS — Y939 Activity, unspecified: Secondary | ICD-10-CM | POA: Diagnosis not present

## 2019-09-06 DIAGNOSIS — S0990XA Unspecified injury of head, initial encounter: Secondary | ICD-10-CM | POA: Diagnosis not present

## 2019-09-06 DIAGNOSIS — I509 Heart failure, unspecified: Secondary | ICD-10-CM | POA: Diagnosis not present

## 2019-09-06 DIAGNOSIS — S199XXA Unspecified injury of neck, initial encounter: Secondary | ICD-10-CM | POA: Diagnosis not present

## 2019-09-06 DIAGNOSIS — W19XXXA Unspecified fall, initial encounter: Secondary | ICD-10-CM

## 2019-09-06 DIAGNOSIS — S0232XA Fracture of orbital floor, left side, initial encounter for closed fracture: Secondary | ICD-10-CM | POA: Insufficient documentation

## 2019-09-06 MED ORDER — LIDOCAINE-EPINEPHRINE (PF) 2 %-1:200000 IJ SOLN
10.0000 mL | Freq: Once | INTRAMUSCULAR | Status: AC
  Administered 2019-09-06: 10 mL
  Filled 2019-09-06 (×2): qty 10

## 2019-09-06 NOTE — ED Notes (Signed)
ED Provider at bedside. 

## 2019-09-06 NOTE — Discharge Instructions (Addendum)
Continue to take your medication as prescribed. Follow-up with the doctor listed below for further evaluation management of your injury. Return to the emergency room immediately if you develop severe worsening head pain, vision loss, double vision.  Return with any new, worsening, concerning symptoms.

## 2019-09-06 NOTE — ED Provider Notes (Signed)
[facility] EMERGENCY DEPARTMENT Provider Note   CSN: [id number] Arrival date & time: 09/06/19  1449     History   Chief Complaint Chief Complaint  Patient presents with   Head Laceration    HPI [name] is a 78 y.o. male presenting for evaluation of head laceration.  Patient states about 2 hours prior to arrival he was loading equipment into his trailer when his feet slipped backwards and he fell forward, hitting his head on the metal door the trailer.  He denies loss of consciousness.  He reports hitting his eyebrow and nose, bleeding stopped with direct pressure at home.  He denies headache, dizziness, or lightheaded since.  He denies injury elsewhere including neck, chest, or abdomen.  He denies nosebleed.  He is on Xarelto for a fib, has been taking it as prescribed.  Patient had a routine check with his cardiologist yesterday, pacemaker was interrogated and no abnormal rhythms recently.  He has not had anything for pain.     HPI  Past Medical History:  Diagnosis Date   Abdominal pain, epigastric    Allergic rhinitis, cause unspecified    Arthritis    BPH (benign prostatic hyperplasia)    CHF (congestive heart failure) (HCC)    Chronic anticoagulation    xarelto   Coronary artery calcification seen on CAT scan 12/26/2012   Depressive disorder, not elsewhere classified    Diverticulosis of colon (without mention of hemorrhage)    Esophageal reflux    High risk medication use    Hyperlipidemia    Hyperplastic rectal polyp    Hypertension    Lumbago    Pacemaker    Paralyzed hemidiaphragm    LEFT   Persistent atrial fibrillation (HCC)    Sick sinus syndrome [facility]) July 2010   s/p PPM [facility]) by [facility]    Patient Active Problem List   Diagnosis Date Noted   URI (upper respiratory infection) 03/06/2019   Strain of lumbar region 02/10/2018   Hematuria 11/01/2017   Nausea vomiting and diarrhea 05/08/2017   Lumbar  radiculopathy 12/01/2016   Dizziness 11/18/2016   Nocturia 11/18/2016   Hyperglycemia 11/18/2016   Bilateral hip pain 10/20/2016   Bilateral ankle pain 10/20/2016   Bilateral hand pain 10/20/2016   Fatigue 04/28/2016   Acute sinus infection 01/28/2016   Cough 01/28/2016   Chronic pain 07/11/2015   Alcohol abuse 07/01/2015   Increased prostate specific antigen (PSA) velocity 06/26/2014   Neck mass 12/26/2013   Arthritis of ankle or foot, degenerative 12/16/2013   Retrocalcaneal bursitis 12/16/2013   Abdominal pain 11/14/2013   Near syncope 05/06/2013   abnormal (EKG) 05/06/2013   Dry mouth 03/02/2013   Coronary artery calcification seen on CAT scan 12/26/2012   Lumbar back pain 12/26/2012   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   Leg pain, right 04/17/2012   Leg swelling 04/17/2012   DOE (dyspnea on exertion) 02/26/2012   Preventative health care 12/24/2011   Cervical radiculopathy 12/24/2011   Shortness of breath 12/18/2010   SYNCOPE 12/04/2010   FREQUENCY, URINARY 11/24/2010   NECK PAIN, RIGHT 08/21/2010   Atrial fibrillation ([place]) 05/29/2010   Sick sinus syndrome ([place]) 05/20/2010   NAUSEA AND VOMITING 05/20/2010   Rhabdomyolysis 03/25/2010   SHOULDER PAIN, RIGHT 12/09/2009   ACHILLES TENDINITIS 12/09/2009   BPH (benign prostatic hyperplasia) 06/26/2009   WEIGHT LOSS-ABNORMAL 06/05/2009   NAUSEA 06/05/2009   PERSONAL HX COLONIC POLYPS 06/05/2009   BRADYCARDIA 05/27/2009   FATIGUE 05/27/2009  Hyperlipidemia 05/30/2008   Depression 05/30/2008   Essential hypertension 05/30/2008   Allergic rhinitis 05/30/2008   GERD 05/30/2008   Diverticulosis of colon (without mention of hemorrhage) 05/30/2008   OVERACTIVE BLADDER 05/30/2008   LOW BACK PAIN 05/30/2008    Past Surgical History:  Procedure Laterality Date   ESOPHAGOGASTRODUODENOSCOPY N/A 12/05/2013   Procedure: ESOPHAGOGASTRODUODENOSCOPY (EGD);  Surgeon: [name], MD;  Location: [facility];  Service: Endoscopy;  Laterality: N/A;   ESOPHAGOGASTRODUODENOSCOPY N/A 07/04/2015   Procedure: ESOPHAGOGASTRODUODENOSCOPY (EGD);  Surgeon: [name], MD;  Location: [facility];  Service: Endoscopy;  Laterality: N/A;   INSERT / REPLACE / REMOVE PACEMAKER  05/2009    [employer] ALTRUA 60  (serial number [id number]) dual-chamber pacemaker   PACEMAKER PLACEMENT Bilateral    '05        Home Medications    Prior to Admission medications   Medication Sig Start Date End Date Taking? Authorizing Provider  acetaminophen (TYLENOL ARTHRITIS PAIN) 650 MG CR tablet Take 650 mg by mouth every 8 (eight) hours as needed for pain.    [provider]  amLODipine (NORVASC) 5 MG tablet TAKE 1 TABLET BY MOUTH EVERY DAY 06/09/19   Biagio Borg, MD  atorvastatin (LIPITOR) 80 MG tablet TAKE 1 TABLET BY MOUTH EVERY DAY 01/11/19   Biagio Borg, MD  diclofenac sodium (VOLTAREN) 1 % GEL APPLY 4 GRAMS TOPICALLY 4 TIMES A DAY AS NEEDED 06/08/19   Biagio Borg, MD  fexofenadine (ALLEGRA) 180 MG tablet TAKE 1 TABLET (180 MG TOTAL) BY MOUTH DAILY. 12/10/15   Biagio Borg, MD  flecainide (TAMBOCOR) 100 MG tablet TAKE 1 TABLET BY MOUTH TWICE A DAY 12/19/18   Biagio Borg, MD  latanoprost (XALATAN) 0.005 % ophthalmic solution Place 1 drop into both eyes daily.    [provider]  meloxicam (MOBIC) 7.5 MG tablet TAKE 1 TABLET BY MOUTH EVERY DAY AS NEEDED FOR PAIN 03/09/19   Biagio Borg, MD  omeprazole (PRILOSEC) 40 MG capsule TAKE 1 CAPSULE BY MOUTH EVERY DAY 08/04/19   Biagio Borg, MD  oxybutynin (DITROPAN-XL) 10 MG 24 hr tablet TAKE 1 TABLET (10 MG TOTAL) BY MOUTH DAILY. 08/02/15   Biagio Borg, MD  predniSONE (DELTASONE) 10 MG tablet 3 tabs by mouth per day for 3 days,2tabs per day for 3 days,1tab per day for 3 days 03/06/19   Biagio Borg, MD  tamsulosin (FLOMAX) 0.4 MG CAPS capsule TAKE 1 CAPSULE BY MOUTH TWICE A DAY 03/15/19   Biagio Borg, MD    telmisartan (MICARDIS) 20 MG tablet Take 1 tablet (20 mg total) by mouth daily. 01/11/19   Biagio Borg, MD  tiZANidine (ZANAFLEX) 2 MG tablet TAKE 1 TABLET (2 MG TOTAL) BY MOUTH EVERY 6 (SIX) HOURS AS NEEDED FOR MUSCLE SPASMS. 02/17/19   Biagio Borg, MD  traMADol (ULTRAM) 50 MG tablet TAKE 1 TABLET (50 MG TOTAL) BY MOUTH EVERY 6 (SIX) HOURS AS NEEDED. FOR PAIN 04/27/19   Biagio Borg, MD  triamcinolone (NASACORT) 55 MCG/ACT AERO nasal inhaler Place 2 sprays into the nose daily. 03/06/19   Biagio Borg, MD  XARELTO 20 MG TABS tablet TAKE 1 TABLET BY MOUTH EVERY DAY 01/09/19   Biagio Borg, MD    Family History Family History  Problem Relation Age of Onset   Diabetes Mother    Hypertension Mother    Hypertension Father    Lung cancer Father  Cancer Father    Asthma Sister    Hypertension Sister    Arthritis Paternal Grandmother    Hypertension Brother    Hypertension Daughter    Hypertension Son     Social History Social History   Tobacco Use   Smoking status: Former Smoker    Packs/day: 0.50    Years: 25.00    Pack years: 12.50    Types: Cigarettes    Quit date: 11/16/1978    Years since quitting: 40.8   Smokeless tobacco: Former Systems developer    Types: Chew  Substance Use Topics   Alcohol use: No   Drug use: No     Allergies   Patient has no known allergies.   Review of Systems Review of Systems  Eyes: Negative for visual disturbance.  Skin: Positive for wound.  Neurological: Negative for dizziness, light-headedness and headaches.  Hematological: Bruises/bleeds easily.     Physical Exam Updated Vital Signs BP 122/84 (BP Location: Left Arm)    Pulse (!) 59    Temp 98.5 F (36.9 C) (Oral)    Resp 16    Ht 5\' 9"  (1.753 m)    Wt 84.4 kg    SpO2 99%    BMI 27.47 kg/m   Physical Exam Vitals signs and nursing note reviewed.  Constitutional:      General: He is not in acute distress.    Appearance: He is well-developed.     Comments: Appears nontoxic   HENT:     Head: Normocephalic.      Comments: 2 cm laceration over the medial aspect of the left eyebrow.  No active bleeding.  Contusion of the bridge of the nose.  No nasal septal hematoma.  No epistaxis.  No trismus or malocclusion. Eyes:     Extraocular Movements: Extraocular movements intact.     Conjunctiva/sclera: Conjunctivae normal.     Pupils: Pupils are equal, round, and reactive to light.     Comments: EOMI and PERRLA.  No entrapment.  Neck:     Musculoskeletal: Normal range of motion and neck supple.     Comments: No tenderness palpation midline C-spine.  No step-offs or deformities.  Moving head without pain. Cardiovascular:     Rate and Rhythm: Normal rate and regular rhythm.     Pulses: Normal pulses.  Pulmonary:     Effort: Pulmonary effort is normal.     Breath sounds: Normal breath sounds.  Abdominal:     General: There is no distension.  Musculoskeletal: Normal range of motion.  Skin:    General: Skin is warm.     Capillary Refill: Capillary refill takes less than 2 seconds.     Findings: No rash.  Neurological:     Mental Status: He is alert and oriented to person, place, and time.      ED Treatments / Results  Labs (all labs ordered are listed, but only abnormal results are displayed) Labs Reviewed - No data to display  EKG None  Radiology Dg Chest 2 View  Result Date: 09/05/2019 CLINICAL DATA:  Pacemaker lead issue.  Stinging sensation. EXAM: CHEST - 2 VIEW COMPARISON:  March 05, 2019. FINDINGS: The heart size and mediastinal contours are within normal limits. Left-sided pacemaker is unchanged in position. No pneumothorax is noted. Right lung is clear. Minimal left basilar atelectasis or scarring is noted. Elevated left hemidiaphragm is noted. The visualized skeletal structures are unremarkable. IMPRESSION: Left-sided pacemaker is unchanged in position. Stable elevated left hemidiaphragm is noted with  minimal left basilar atelectasis or scarring.  Electronically Signed   By: Marijo Conception M.D.   On: 09/05/2019 10:07   Ct Head Wo Contrast  Result Date: 09/06/2019 CLINICAL DATA:  Head trauma EXAM: CT HEAD WITHOUT CONTRAST CT CERVICAL SPINE WITHOUT CONTRAST TECHNIQUE: Multidetector CT imaging of the head and cervical spine was performed following the standard protocol without intravenous contrast. Multiplanar CT image reconstructions of the cervical spine were also generated. COMPARISON:  CT brain 12/05/2010 FINDINGS: CT HEAD FINDINGS Brain: No acute territorial infarction, hemorrhage or intracranial mass. The ventricles are nonenlarged. Mild to moderate hypodensity in the bilateral white matter consistent with small vessel ischemic change. Vascular: No hyperdense vessels.  Carotid vascular calcification Skull: Normal. Negative for fracture or focal lesion. Sinuses/Orbits: Incompletely visualized blowout fracture of the left medial orbital floor with herniation of orbital fat into the maxillary sinus and mild protrusion of the inferior muscle belly of the inferior rectus into the adjacent sinus. Small foci of orbital gas. Other: Laceration/soft tissue gas over the left nasal bridge and left orbit. CT CERVICAL SPINE FINDINGS Alignment: Straightening of the cervical spine. No subluxation. Anterior offset of the spinal laminar line. Widening of the predental space up to 6 mm. Skull base and vertebrae: Craniovertebral junction is intact. No fracture is seen. Soft tissues and spinal canal: No prevertebral fluid collections. Narrowing of the canal at the C1-C2 articulation with mass effect on the anterior thecal sac. Disc levels: Moderate-to-marked diffuse degenerative change C3 through C7 with disc space narrowing, vacuum disc and osteophytes. Multiple level facet degenerative change with resultant foraminal stenosis at multiple levels. Upper chest: Lung apices are clear.  Carotid vascular calcification Other: None IMPRESSION: 1. No CT evidence for acute  intracranial abnormality. Small vessel ischemic change of the white matter 2. Incompletely visualized blowout fracture of the left orbital floor with herniation of orbital fat and protrusion inferior muscle belly of inferior rectus into adjacent maxillary sinus. Small amount of left orbital gas. 3. Straightening of the cervical spine with advanced degenerative change. No definitive fracture seen, however there is abnormal widening of the atlantodental interval, raising concern for ligamentous injury at this level given history of trauma. Electronically Signed   By: Donavan Foil M.D.   On: 09/06/2019 17:28   Ct Cervical Spine Wo Contrast  Result Date: 09/06/2019 CLINICAL DATA:  Head trauma EXAM: CT HEAD WITHOUT CONTRAST CT CERVICAL SPINE WITHOUT CONTRAST TECHNIQUE: Multidetector CT imaging of the head and cervical spine was performed following the standard protocol without intravenous contrast. Multiplanar CT image reconstructions of the cervical spine were also generated. COMPARISON:  CT brain 12/05/2010 FINDINGS: CT HEAD FINDINGS Brain: No acute territorial infarction, hemorrhage or intracranial mass. The ventricles are nonenlarged. Mild to moderate hypodensity in the bilateral white matter consistent with small vessel ischemic change. Vascular: No hyperdense vessels.  Carotid vascular calcification Skull: Normal. Negative for fracture or focal lesion. Sinuses/Orbits: Incompletely visualized blowout fracture of the left medial orbital floor with herniation of orbital fat into the maxillary sinus and mild protrusion of the inferior muscle belly of the inferior rectus into the adjacent sinus. Small foci of orbital gas. Other: Laceration/soft tissue gas over the left nasal bridge and left orbit. CT CERVICAL SPINE FINDINGS Alignment: Straightening of the cervical spine. No subluxation. Anterior offset of the spinal laminar line. Widening of the predental space up to 6 mm. Skull base and vertebrae: Craniovertebral  junction is intact. No fracture is seen. Soft tissues and spinal canal: No prevertebral fluid  collections. Narrowing of the canal at the C1-C2 articulation with mass effect on the anterior thecal sac. Disc levels: Moderate-to-marked diffuse degenerative change C3 through C7 with disc space narrowing, vacuum disc and osteophytes. Multiple level facet degenerative change with resultant foraminal stenosis at multiple levels. Upper chest: Lung apices are clear.  Carotid vascular calcification Other: None IMPRESSION: 1. No CT evidence for acute intracranial abnormality. Small vessel ischemic change of the white matter 2. Incompletely visualized blowout fracture of the left orbital floor with herniation of orbital fat and protrusion inferior muscle belly of inferior rectus into adjacent maxillary sinus. Small amount of left orbital gas. 3. Straightening of the cervical spine with advanced degenerative change. No definitive fracture seen, however there is abnormal widening of the atlantodental interval, raising concern for ligamentous injury at this level given history of trauma. Electronically Signed   By: Donavan Foil M.D.   On: 09/06/2019 17:28    Procedures .Marland KitchenLaceration Repair  Date/Time: 09/06/2019 5:30 PM Performed by: Franchot Heidelberg, PA-C Authorized by: Franchot Heidelberg, PA-C   Consent:    Consent obtained:  Verbal   Consent given by:  Patient   Risks discussed:  Infection, need for additional repair, nerve damage, poor wound healing, poor cosmetic result and pain Anesthesia (see MAR for exact dosages):    Anesthesia method:  Local infiltration   Local anesthetic:  Lidocaine 2% WITH epi Laceration details:    Location:  Face   Face location:  L eyebrow   Length (cm):  2   Depth (mm):  2 Repair type:    Repair type:  Simple Pre-procedure details:    Preparation:  Patient was prepped and draped in usual sterile fashion and imaging obtained to evaluate for foreign bodies Exploration:     Wound exploration: wound explored through full range of motion and entire depth of wound probed and visualized     Wound extent: no foreign bodies/material noted, no muscle damage noted, no underlying fracture noted and no vascular damage noted   Treatment:    Area cleansed with:  Betadine   Amount of cleaning:  Standard   Irrigation solution:  Sterile water   Irrigation method:  Syringe Skin repair:    Repair method:  Sutures   Suture size:  5-0   Wound skin closure material used: vicryl rapide.   Suture technique:  Simple interrupted   Number of sutures:  4 Approximation:    Approximation:  Close Post-procedure details:    Dressing:  Open (no dressing)   Patient tolerance of procedure:  Tolerated well, no immediate complications   (including critical care time)  Medications Ordered in ED Medications  lidocaine-EPINEPHrine (XYLOCAINE W/EPI) 2 %-1:200000 (PF) injection 10 mL (10 mLs Infiltration Given by Other 09/06/19 1609)     Initial Impression / Assessment and Plan / ED Course  I have reviewed the triage vital signs and the nursing notes.  Pertinent labs & imaging results that were available during my care of the patient were reviewed by me and considered in my medical decision making (see chart for details).        Patient presenting for evaluation of a laceration after fall.  Physical exam shows patient appears nontoxic.  Full was mechanical per history.  Patient is neurovascularly intact.  As he had a fall and is on blood thinners, will obtain CT of the head.  Considering patient fell face first, concern for hyperflexion, will obtain CT neck as well. Lac repaired as described above.  Aftercare instructions  given.  CT cervical spine negative for fracture.  CT head shows blowout fracture of the orbital floor, possible muscle involvement.  Clinically patient without any signs of entrapment.  No double vision or blurry vision.  Discussed follow-up with ENT for further  evaluation management.  Strict return precautions given, including any vision changes, blurry vision, or difficulty moving his eye. Case discussed with attending, Dr. Wilson Singer agrees to plan. At this time, pt appears safe for d/c. Return precautions given. Pt states he understands and agrees to plan.  Final Clinical Impressions(s) / ED Diagnoses   Final diagnoses:  Closed blow-out fracture of left orbital floor [facility])  Laceration of left periocular area without foreign body, initial encounter  Fall, initial encounter    ED Discharge Orders    None       Franchot Heidelberg, PA-C 09/06/19 1842    Virgel Manifold, MD 09/08/19 1003

## 2019-09-06 NOTE — ED Triage Notes (Signed)
Pt c/o fall with lac above left eye x 2 hrs ago , No LOC

## 2019-09-06 NOTE — Telephone Encounter (Signed)
LMOM  Reviewed CXR and case with Dr. Rayann Heman.   No further changes needed to his programming at this time. He will need a visit in 3 months (was going to talk to him about when works best) and to call if he becomes symptomatic.  (more SOB, decreased exercise tolerance)   [name] "[employer], PA-C  09/06/2019 9:30 AM

## 2019-09-06 NOTE — Telephone Encounter (Signed)
Discussed with patient.   No further changes at this time. Follow up made for January. Call back with symptoms.   Legrand Como [address]" [place], PA-C  09/06/2019 9:43 AM

## 2019-09-07 DIAGNOSIS — H2513 Age-related nuclear cataract, bilateral: Secondary | ICD-10-CM | POA: Diagnosis not present

## 2019-09-07 DIAGNOSIS — H401131 Primary open-angle glaucoma, bilateral, mild stage: Secondary | ICD-10-CM | POA: Diagnosis not present

## 2019-09-07 DIAGNOSIS — H20042 Secondary noninfectious iridocyclitis, left eye: Secondary | ICD-10-CM | POA: Diagnosis not present

## 2019-09-07 DIAGNOSIS — H1132 Conjunctival hemorrhage, left eye: Secondary | ICD-10-CM | POA: Diagnosis not present

## 2019-09-07 DIAGNOSIS — S0512XA Contusion of eyeball and orbital tissues, left eye, initial encounter: Secondary | ICD-10-CM | POA: Diagnosis not present

## 2019-09-12 DIAGNOSIS — S0230XA Fracture of orbital floor, unspecified side, initial encounter for closed fracture: Secondary | ICD-10-CM | POA: Diagnosis not present

## 2019-09-15 DIAGNOSIS — S0230XA Fracture of orbital floor, unspecified side, initial encounter for closed fracture: Secondary | ICD-10-CM | POA: Insufficient documentation

## 2019-09-21 ENCOUNTER — Other Ambulatory Visit: Payer: Self-pay | Admitting: Internal Medicine

## 2019-09-21 NOTE — Telephone Encounter (Signed)
[place] for tramadol refill  Please to let pt know that he should request flecainide refills per cardiology as this is out of a PCP scope of practice

## 2019-10-15 ENCOUNTER — Other Ambulatory Visit: Payer: Self-pay | Admitting: Internal Medicine

## 2019-10-22 ENCOUNTER — Other Ambulatory Visit: Payer: Self-pay | Admitting: Internal Medicine

## 2019-11-06 ENCOUNTER — Other Ambulatory Visit: Payer: Self-pay | Admitting: Internal Medicine

## 2019-11-07 ENCOUNTER — Other Ambulatory Visit: Payer: Self-pay

## 2019-11-07 ENCOUNTER — Ambulatory Visit (INDEPENDENT_AMBULATORY_CARE_PROVIDER_SITE_OTHER): Payer: Medicare Other | Admitting: Internal Medicine

## 2019-11-07 ENCOUNTER — Encounter: Payer: Self-pay | Admitting: Internal Medicine

## 2019-11-07 ENCOUNTER — Telehealth: Payer: Self-pay | Admitting: Internal Medicine

## 2019-11-07 VITALS — BP 160/102 | HR 60 | Temp 98.2°F | Resp 12 | Ht 68.0 in | Wt 182.0 lb

## 2019-11-07 DIAGNOSIS — E559 Vitamin D deficiency, unspecified: Secondary | ICD-10-CM

## 2019-11-07 DIAGNOSIS — R739 Hyperglycemia, unspecified: Secondary | ICD-10-CM

## 2019-11-07 DIAGNOSIS — N320 Bladder-neck obstruction: Secondary | ICD-10-CM

## 2019-11-07 DIAGNOSIS — E785 Hyperlipidemia, unspecified: Secondary | ICD-10-CM | POA: Diagnosis not present

## 2019-11-07 DIAGNOSIS — Z23 Encounter for immunization: Secondary | ICD-10-CM

## 2019-11-07 DIAGNOSIS — E538 Deficiency of other specified B group vitamins: Secondary | ICD-10-CM

## 2019-11-07 DIAGNOSIS — E611 Iron deficiency: Secondary | ICD-10-CM

## 2019-11-07 DIAGNOSIS — I10 Essential (primary) hypertension: Secondary | ICD-10-CM

## 2019-11-07 DIAGNOSIS — R3129 Other microscopic hematuria: Secondary | ICD-10-CM

## 2019-11-07 LAB — URINALYSIS, ROUTINE W REFLEX MICROSCOPIC
Bilirubin Urine: NEGATIVE
Ketones, ur: NEGATIVE
Leukocytes,Ua: NEGATIVE
Nitrite: NEGATIVE
Specific Gravity, Urine: 1.025 (ref 1.000–1.030)
Total Protein, Urine: NEGATIVE
Urine Glucose: NEGATIVE
Urobilinogen, UA: 0.2 (ref 0.0–1.0)
pH: 6.5 (ref 5.0–8.0)

## 2019-11-07 LAB — CBC WITH DIFFERENTIAL/PLATELET
Basophils Absolute: 0 10*3/uL (ref 0.0–0.1)
Basophils Relative: 0.6 % (ref 0.0–3.0)
Eosinophils Absolute: 0 10*3/uL (ref 0.0–0.7)
Eosinophils Relative: 0.7 % (ref 0.0–5.0)
HCT: 46.7 % (ref 39.0–52.0)
Hemoglobin: 15.3 g/dL (ref 13.0–17.0)
Lymphocytes Relative: 22.2 % (ref 12.0–46.0)
Lymphs Abs: 1.5 10*3/uL (ref 0.7–4.0)
MCHC: 32.7 g/dL (ref 30.0–36.0)
MCV: 94.1 fl (ref 78.0–100.0)
Monocytes Absolute: 0.7 10*3/uL (ref 0.1–1.0)
Monocytes Relative: 9.7 % (ref 3.0–12.0)
Neutro Abs: 4.6 10*3/uL (ref 1.4–7.7)
Neutrophils Relative %: 66.8 % (ref 43.0–77.0)
Platelets: 186 10*3/uL (ref 150.0–400.0)
RBC: 4.97 Mil/uL (ref 4.22–5.81)
RDW: 14.1 % (ref 11.5–15.5)
WBC: 6.9 10*3/uL (ref 4.0–10.5)

## 2019-11-07 LAB — HEPATIC FUNCTION PANEL
ALT: 43 U/L (ref 0–53)
AST: 29 U/L (ref 0–37)
Albumin: 4.6 g/dL (ref 3.5–5.2)
Alkaline Phosphatase: 79 U/L (ref 39–117)
Bilirubin, Direct: 0.1 mg/dL (ref 0.0–0.3)
Total Bilirubin: 0.6 mg/dL (ref 0.2–1.2)
Total Protein: 7 g/dL (ref 6.0–8.3)

## 2019-11-07 LAB — PSA: PSA: 1.76 ng/mL (ref 0.10–4.00)

## 2019-11-07 LAB — LIPID PANEL
Cholesterol: 185 mg/dL (ref 0–200)
HDL: 75 mg/dL (ref >39.00)
LDL Cholesterol: 98 mg/dL (ref 0–99)
NonHDL: 110.09
Total CHOL/HDL Ratio: 2
Triglycerides: 59 mg/dL (ref 0.0–149.0)
VLDL: 11.8 mg/dL (ref 0.0–40.0)

## 2019-11-07 LAB — BASIC METABOLIC PANEL
BUN: 14 mg/dL (ref 6–23)
CO2: 25 mEq/L (ref 19–32)
Calcium: 9.7 mg/dL (ref 8.4–10.5)
Chloride: 108 mEq/L (ref 96–112)
Creatinine, Ser: 0.79 mg/dL (ref 0.40–1.50)
GFR: 115.22 mL/min (ref >60.00)
Glucose, Bld: 119 mg/dL — ABNORMAL HIGH (ref 70–99)
Potassium: 3.9 mEq/L (ref 3.5–5.1)
Sodium: 143 mEq/L (ref 135–145)

## 2019-11-07 LAB — IBC PANEL
Iron: 112 ug/dL (ref 42–165)
Saturation Ratios: 33.1 % (ref 20.0–50.0)
Transferrin: 242 mg/dL (ref 212.0–360.0)

## 2019-11-07 LAB — TSH: TSH: 1.36 u[IU]/mL (ref 0.35–4.50)

## 2019-11-07 LAB — VITAMIN B12: Vitamin B-12: 271 pg/mL (ref 211–911)

## 2019-11-07 LAB — HEMOGLOBIN A1C: Hgb A1c MFr Bld: 6.2 % (ref 4.6–6.5)

## 2019-11-07 LAB — VITAMIN D 25 HYDROXY (VIT D DEFICIENCY, FRACTURES): VITD: 22.25 ng/mL — ABNORMAL LOW (ref 30.00–100.00)

## 2019-11-07 MED ORDER — VITAMIN D (ERGOCALCIFEROL) 1.25 MG (50000 UNIT) PO CAPS: 50000.0000 [IU] | ORAL_CAPSULE | ORAL | 0 refills | Status: DC

## 2019-11-07 NOTE — Progress Notes (Addendum)
Subjective:    Patient ID: [name], male    DOB: 1941/04/09, 78 y.o.   MRN: [id number]  HPI Here to f/u; overall doing ok,  Pt denies chest pain, increasing sob or doe, wheezing, orthopnea, PND, increased LE swelling, palpitations, dizziness or syncope.  Pt denies new neurological symptoms such as new headache, or facial or extremity weakness or numbness.  Pt denies polydipsia, polyuria, or low sugar episode.  Pt states overall good compliance with meds, mostly trying to follow appropriate diet, with wt overall stable,  but little exercise however   BP at home < 140/90.  Did accidentally slipped and fell with left periorbital fx now healed without complication.  Denies urinary symptoms such as dysuria, frequency, urgency, flank pain, hematuria or n/v, fever, chills, has hx of left renal stone and 04/2017 CT abd/pelvis and cysto BP Readings from Last 3 Encounters:  11/07/19 (!) 160/102  09/06/19 122/84  03/05/19 (!) 137/97   Wt Readings from Last 3 Encounters:  11/07/19 182 lb (82.6 kg)  09/06/19 186 lb (84.4 kg)  03/05/19 185 lb (83.9 kg)   Past Medical History:  Diagnosis Date  . Abdominal pain, epigastric   . Allergic rhinitis, cause unspecified   . Arthritis   . BPH (benign prostatic hyperplasia)   . CHF (congestive heart failure) ([place])   . Chronic anticoagulation    xarelto  . Coronary artery calcification seen on CAT scan 12/26/2012  . Depressive disorder, not elsewhere classified   . Diverticulosis of colon (without mention of hemorrhage)   . Esophageal reflux   . High risk medication use   . Hyperlipidemia   . Hyperplastic rectal polyp   . Hypertension   . Lumbago   . Pacemaker   . Paralyzed hemidiaphragm    LEFT  . Persistent atrial fibrillation ([place])   . Sick sinus syndrome [facility]) July 2010   s/p PPM [facility]) by [name]   Past Surgical History:  Procedure Laterality Date  . ESOPHAGOGASTRODUODENOSCOPY N/A 12/05/2013   Procedure: ESOPHAGOGASTRODUODENOSCOPY  (EGD);  Surgeon: [name], MD;  Location: [facility];  Service: Endoscopy;  Laterality: N/A;  . ESOPHAGOGASTRODUODENOSCOPY N/A 07/04/2015   Procedure: ESOPHAGOGASTRODUODENOSCOPY (EGD);  Surgeon: [name], MD;  Location: [place];  Service: Endoscopy;  Laterality: N/A;  . INSERT / REPLACE / REMOVE PACEMAKER  05/2009    [employer] ALTRUA 60  (serial number [id number]) dual-chamber pacemaker  . PACEMAKER PLACEMENT Bilateral    '05    reports that he quit smoking about 41 years ago. His smoking use included cigarettes. He has a 12.50 pack-year smoking history. He has quit using smokeless tobacco.  His smokeless tobacco use included chew. He reports that he does not drink alcohol or use drugs. family history includes Arthritis in his paternal grandmother; Asthma in his sister; Cancer in his father; Diabetes in his mother; Hypertension in his brother, daughter, father, mother, sister, and son; Lung cancer in his father. No Known Allergies Current Outpatient Medications on File Prior to Visit  Medication Sig Dispense Refill  . acetaminophen (TYLENOL ARTHRITIS PAIN) 650 MG CR tablet Take 650 mg by mouth every 8 (eight) hours as needed for pain.    Marland Kitchen amLODipine (NORVASC) 5 MG tablet TAKE 1 TABLET BY MOUTH EVERY DAY 90 tablet 1  . atorvastatin (LIPITOR) 80 MG tablet TAKE 1 TABLET BY MOUTH EVERY DAY 90 tablet 3  . diclofenac sodium (VOLTAREN) 1 % GEL APPLY 4 GRAMS TOPICALLY 4 TIMES A DAY AS  NEEDED 400 g 1  . fexofenadine (ALLEGRA) 180 MG tablet TAKE 1 TABLET (180 MG TOTAL) BY MOUTH DAILY. 90 tablet 1  . flecainide (TAMBOCOR) 100 MG tablet TAKE 1 TABLET BY MOUTH TWICE A DAY 180 tablet 3  . latanoprost (XALATAN) 0.005 % ophthalmic solution Place 1 drop into both eyes daily.    Marland Kitchen omeprazole (PRILOSEC) 40 MG capsule TAKE 1 CAPSULE BY MOUTH EVERY DAY 90 capsule 1  . oxybutynin (DITROPAN-XL) 10 MG 24 hr tablet TAKE 1 TABLET (10 MG TOTAL) BY MOUTH DAILY. 90 tablet 1  . rivaroxaban (XARELTO)  20 MG TABS tablet Take 1 tablet (20 mg total) by mouth daily. Annual appt is due must see provider for future refills 30 tablet 0  . tamsulosin (FLOMAX) 0.4 MG CAPS capsule TAKE 1 CAPSULE BY MOUTH TWICE A DAY 180 capsule 3  . telmisartan (MICARDIS) 20 MG tablet TAKE 1 TABLET BY MOUTH EVERY DAY 90 tablet 3  . tiZANidine (ZANAFLEX) 2 MG tablet TAKE 1 TABLET (2 MG TOTAL) BY MOUTH EVERY 6 (SIX) HOURS AS NEEDED FOR MUSCLE SPASMS. 50 tablet 2  . traMADol (ULTRAM) 50 MG tablet TAKE 1 TABLET (50 MG TOTAL) BY MOUTH EVERY 6 (SIX) HOURS AS NEEDED FOR PAIN 120 tablet 2  . triamcinolone (NASACORT) 55 MCG/ACT AERO nasal inhaler Place 2 sprays into the nose daily. 1 Inhaler 12  . meloxicam (MOBIC) 7.5 MG tablet TAKE 1 TABLET BY MOUTH EVERY DAY AS NEEDED FOR PAIN (Patient not taking: Reported on 11/07/2019) 30 tablet 5  . predniSONE (DELTASONE) 10 MG tablet 3 tabs by mouth per day for 3 days,2tabs per day for 3 days,1tab per day for 3 days (Patient not taking: Reported on 11/07/2019) 18 tablet 0   No current facility-administered medications on file prior to visit.   Review of Systems  Constitutional: Negative for other unusual diaphoresis or sweats HENT: Negative for ear discharge or swelling Eyes: Negative for other worsening visual disturbances Respiratory: Negative for stridor or other swelling  Gastrointestinal: Negative for worsening distension or other blood Genitourinary: Negative for retention or other urinary change Musculoskeletal: Negative for other MSK pain or swelling Skin: Negative for color change or other new lesions Neurological: Negative for worsening tremors and other numbness  Psychiatric/Behavioral: Negative for worsening agitation or other fatigue All otherwise neg per pt     Objective:   Physical Exam BP (!) 160/102   Pulse 60   Temp 98.2 F (36.8 C)   Resp 12   Ht 5\' 8"  (1.727 m)   Wt 182 lb (82.6 kg)   SpO2 99%   BMI 27.67 kg/m  VS noted,  Constitutional: Pt appears in  NAD HENT: Head: NCAT.  Right Ear: External ear normal.  Left Ear: External ear normal.  Eyes: . Pupils are equal, round, and reactive to light. Conjunctivae and EOM are normal Nose: without d/c or deformity Neck: Neck supple. Gross normal ROM Cardiovascular: Normal rate and regular rhythm.   Pulmonary/Chest: Effort normal and breath sounds without rales or wheezing.  Abd:  Soft, NT, ND, + BS, no organomegaly Neurological: Pt is alert. At baseline orientation, motor grossly intact Skin: Skin is warm. No rashes, other new lesions, no LE edema Psychiatric: Pt behavior is normal without agitation  All otherwise neg per pt  Lab Results  Component Value Date   WBC 7.9 03/05/2019   HGB 15.6 03/05/2019   HCT 48.5 03/05/2019   PLT 201 03/05/2019   GLUCOSE 149 (H) 03/05/2019   CHOL  167 11/02/2018   TRIG 47.0 11/02/2018   HDL 71.40 11/02/2018   LDLDIRECT 183.4 12/26/2013   LDLCALC 87 11/02/2018   ALT 37 03/05/2019   AST 32 03/05/2019   NA 136 03/05/2019   K 4.0 03/05/2019   CL 106 03/05/2019   CREATININE 0.85 03/05/2019   BUN 14 03/05/2019   CO2 23 03/05/2019   TSH 0.97 11/02/2018   PSA 2.03 11/02/2018   INR 1.45 07/01/2015   HGBA1C 6.0 11/02/2018        Assessment & Plan:

## 2019-11-07 NOTE — Telephone Encounter (Signed)
none

## 2019-11-07 NOTE — Patient Instructions (Addendum)
You had the Tdap tetanus shot today  Please continue all other medications as before, and refills have been done if requested.  Please have the pharmacy call with any other refills you may need.  Please continue your efforts at being more active, low cholesterol diet, and weight control.  You are otherwise up to date with prevention measures today.  Please keep your appointments with your specialists as you may have planned  Please go to the LAB at the blood drawing area for the tests to be done  You will be contacted by phone if any changes need to be made immediately.  Otherwise, you will receive a letter about your results with an explanation, but please check with MyChart first.  Please remember to sign up for MyChart if you have not done so, as this will be important to you in the future with finding out test results, communicating by private email, and scheduling acute appointments online when needed.  Please return in 6 months, or sooner if needed

## 2019-11-19 ENCOUNTER — Encounter: Payer: Self-pay | Admitting: Internal Medicine

## 2019-11-19 NOTE — Assessment & Plan Note (Signed)
Recent urology eval neg for malignancy, ok to follow

## 2019-11-19 NOTE — Assessment & Plan Note (Signed)
stable overall by history and exam, recent data reviewed with pt, and pt to continue medical treatment as before,  to f/u any worsening symptoms or concerns  

## 2019-11-23 ENCOUNTER — Ambulatory Visit (INDEPENDENT_AMBULATORY_CARE_PROVIDER_SITE_OTHER): Payer: Medicare Other | Admitting: *Deleted

## 2019-11-23 ENCOUNTER — Other Ambulatory Visit: Payer: Self-pay

## 2019-11-23 ENCOUNTER — Telehealth: Payer: Self-pay | Admitting: *Deleted

## 2019-11-23 DIAGNOSIS — I495 Sick sinus syndrome: Secondary | ICD-10-CM | POA: Diagnosis not present

## 2019-11-23 DIAGNOSIS — T82110D Breakdown (mechanical) of cardiac electrode, subsequent encounter: Secondary | ICD-10-CM

## 2019-11-23 LAB — CUP PACEART INCLINIC DEVICE CHECK
Brady Statistic RA Percent Paced: 90 %
Brady Statistic RV Percent Paced: 18 %
Date Time Interrogation Session: 20210107180020
Implantable Lead Implant Date: 20100719
Implantable Lead Implant Date: 20100719
Implantable Lead Location: 753859
Implantable Lead Location: 753860
Implantable Lead Model: 4136
Implantable Lead Model: 4137
Implantable Lead Serial Number: 28454141
Implantable Lead Serial Number: 28594836
Implantable Pulse Generator Implant Date: 20100719
Lead Channel Impedance Value: 2500 Ohm
Lead Channel Impedance Value: 580 Ohm
Lead Channel Pacing Threshold Amplitude: 0.7 V
Lead Channel Pacing Threshold Pulse Width: 0.4 ms
Lead Channel Sensing Intrinsic Amplitude: 3.8 mV
Lead Channel Sensing Intrinsic Amplitude: 4.1 mV
Lead Channel Setting Pacing Amplitude: 2 V
Pulse Gen Serial Number: 764370

## 2019-11-23 NOTE — Patient Instructions (Signed)
Call our [facility] at [phone] if you begin having dizzy spells. If you have any passing out spells, call 911 and go to the ER.   Raquel Sarna, RN, will review your pacemaker check with Dr. Rayann Heman and call you with his recommendations for follow-up.

## 2019-11-23 NOTE — Telephone Encounter (Signed)
LMOVM requesting call back to DC. Direct number and office hours provided. Will schedule for virtual visit next week to discuss RV lead failure per Dr. Rayann Heman.

## 2019-11-23 NOTE — Progress Notes (Signed)
Pacemaker check in clinic. Normal device function. RA thresholds, sensing, impedances consistent with previous measurements. No RV capture noted at 6.5V @ 1.67ms (both unipolar and bipolar), RV impedance >2500ohms. CXR previously performed in 08/2019. 18% VP at max AV delay of 343ms. Changes per Dr. Lovena Le: mode reprogrammed from [facility] to [facility]. EKG performed and reviewed by Dr. Lovena Le, no additional changes recommended at this time. 86 mode switches (0%), no EGMs available, total time 4.7 hours, on Xarelto. 2.9K high ventricular rates noted--all available EGMs show RV lead noise and RV pacing with non-capture and conducted VS (device double-counting). Device programmed at appropriate safety margins; RA safety switch turned on. Histogram distribution appropriate for patient activity level. Device programmed to optimize intrinsic conduction. Estimated longevity 1 years. Patient education completed. Per Dr. Jackalyn Lombard recommendation, will have patient f/u next available via virtual visit. Patient aware to call our office for increased dizziness or other symptoms.

## 2019-11-24 NOTE — Telephone Encounter (Signed)
Patient returned call. He is agreeable to a MyChart video visit with Dr. Rayann Heman on 11/27/19 at 11:30am to discuss RV lead failure. No new concerns at this time.

## 2019-11-27 ENCOUNTER — Telehealth (INDEPENDENT_AMBULATORY_CARE_PROVIDER_SITE_OTHER): Payer: Medicare Other | Admitting: Internal Medicine

## 2019-11-27 ENCOUNTER — Telehealth: Payer: Self-pay | Admitting: *Deleted

## 2019-11-27 ENCOUNTER — Encounter: Payer: Self-pay | Admitting: Internal Medicine

## 2019-11-27 VITALS — BP 139/86 | HR 60 | Ht 68.0 in | Wt 186.0 lb

## 2019-11-27 DIAGNOSIS — I10 Essential (primary) hypertension: Secondary | ICD-10-CM | POA: Diagnosis not present

## 2019-11-27 DIAGNOSIS — I495 Sick sinus syndrome: Secondary | ICD-10-CM | POA: Diagnosis not present

## 2019-11-27 DIAGNOSIS — T82110D Breakdown (mechanical) of cardiac electrode, subsequent encounter: Secondary | ICD-10-CM

## 2019-11-27 DIAGNOSIS — I480 Paroxysmal atrial fibrillation: Secondary | ICD-10-CM

## 2019-11-27 NOTE — Progress Notes (Signed)
Electrophysiology TeleHealth Note   Due to national recommendations of social distancing due to COVID 19, an audio/video telehealth visit is felt to be most appropriate for this patient at this time.  See MyChart message from today for the patient's consent to telehealth for [facility].   Date:  11/27/2019   ID:  [name], DOB 1941-04-30, MRN [id number]  Location: patient's home  Provider location:  [facility]  Evaluation Performed: Follow-up visit  PCP:  Biagio Borg, MD   Electrophysiologist:  Dr Rayann Heman  Chief Complaint:  palpitations  History of Present Illness:    [name] is a 79 y.o. male who presents via telehealth conferencing today.  Since last being seen in our clinic, the patient reports doing very well.  Today, he denies symptoms of palpitations, chest pain, shortness of breath,  lower extremity edema, dizziness, presyncope, or syncope.  The patient is otherwise without complaint today.  The patient denies symptoms of fevers, chills, cough, or new SOB worrisome for COVID 19.  Past Medical History:  Diagnosis Date  . Abdominal pain, epigastric   . Allergic rhinitis, cause unspecified   . Arthritis   . BPH (benign prostatic hyperplasia)   . CHF (congestive heart failure) ([place])   . Chronic anticoagulation    xarelto  . Coronary artery calcification seen on CAT scan 12/26/2012  . Depressive disorder, not elsewhere classified   . Diverticulosis of colon (without mention of hemorrhage)   . Esophageal reflux   . High risk medication use   . Hyperlipidemia   . Hyperplastic rectal polyp   . Hypertension   . Lumbago   . Pacemaker   . Paralyzed hemidiaphragm    LEFT  . Persistent atrial fibrillation ([place])   . Sick sinus syndrome [facility]) July 2010   s/p PPM [facility]) by [name]    Past Surgical History:  Procedure Laterality Date  . ESOPHAGOGASTRODUODENOSCOPY N/A 12/05/2013   Procedure: ESOPHAGOGASTRODUODENOSCOPY (EGD);  Surgeon: [name],  MD;  Location: [facility];  Service: Endoscopy;  Laterality: N/A;  . ESOPHAGOGASTRODUODENOSCOPY N/A 07/04/2015   Procedure: ESOPHAGOGASTRODUODENOSCOPY (EGD);  Surgeon: [name], MD;  Location: [place];  Service: Endoscopy;  Laterality: N/A;  . INSERT / REPLACE / REMOVE PACEMAKER  05/2009    [employer] ALTRUA 60  (serial number [id number]) dual-chamber pacemaker  . PACEMAKER PLACEMENT Bilateral    '05    Current Outpatient Medications  Medication Sig Dispense Refill  . acetaminophen (TYLENOL ARTHRITIS PAIN) 650 MG CR tablet Take 650 mg by mouth every 8 (eight) hours as needed for pain.    Marland Kitchen amLODipine (NORVASC) 5 MG tablet TAKE 1 TABLET BY MOUTH EVERY DAY 90 tablet 1  . atorvastatin (LIPITOR) 80 MG tablet TAKE 1 TABLET BY MOUTH EVERY DAY 90 tablet 3  . diclofenac sodium (VOLTAREN) 1 % GEL APPLY 4 GRAMS TOPICALLY 4 TIMES A DAY AS NEEDED 400 g 1  . fexofenadine (ALLEGRA) 180 MG tablet TAKE 1 TABLET (180 MG TOTAL) BY MOUTH DAILY. 90 tablet 1  . flecainide (TAMBOCOR) 100 MG tablet TAKE 1 TABLET BY MOUTH TWICE A DAY 180 tablet 3  . latanoprost (XALATAN) 0.005 % ophthalmic solution Place 1 drop into both eyes daily.    . meloxicam (MOBIC) 7.5 MG tablet TAKE 1 TABLET BY MOUTH EVERY DAY AS NEEDED FOR PAIN 30 tablet 5  . omeprazole (PRILOSEC) 40 MG capsule TAKE 1 CAPSULE BY MOUTH EVERY DAY 90 capsule 1  . oxybutynin (  DITROPAN-XL) 10 MG 24 hr tablet TAKE 1 TABLET (10 MG TOTAL) BY MOUTH DAILY. 90 tablet 1  . predniSONE (DELTASONE) 10 MG tablet 3 tabs by mouth per day for 3 days,2tabs per day for 3 days,1tab per day for 3 days 18 tablet 0  . rivaroxaban (XARELTO) 20 MG TABS tablet Take 1 tablet (20 mg total) by mouth daily. Annual appt is due must see provider for future refills 30 tablet 0  . tamsulosin (FLOMAX) 0.4 MG CAPS capsule TAKE 1 CAPSULE BY MOUTH TWICE A DAY 180 capsule 3  . telmisartan (MICARDIS) 20 MG tablet TAKE 1 TABLET BY MOUTH EVERY DAY 90 tablet 3  . tiZANidine (ZANAFLEX)  2 MG tablet TAKE 1 TABLET (2 MG TOTAL) BY MOUTH EVERY 6 (SIX) HOURS AS NEEDED FOR MUSCLE SPASMS. 50 tablet 2  . traMADol (ULTRAM) 50 MG tablet TAKE 1 TABLET (50 MG TOTAL) BY MOUTH EVERY 6 (SIX) HOURS AS NEEDED FOR PAIN 120 tablet 2  . triamcinolone (NASACORT) 55 MCG/ACT AERO nasal inhaler Place 2 sprays into the nose daily. 1 Inhaler 12  . Vitamin D, Ergocalciferol, (DRISDOL) 1.25 MG (50000 UT) CAPS capsule Take 1 capsule (50,000 Units total) by mouth every 7 (seven) days. 12 capsule 0   No current facility-administered medications for this visit.    Allergies:   Patient has no known allergies.   Social History:  The patient  reports that he quit smoking about 41 years ago. His smoking use included cigarettes. He has a 12.50 pack-year smoking history. He has quit using smokeless tobacco.  His smokeless tobacco use included chew. He reports that he does not drink alcohol or use drugs.   Family History:  The patient's family history includes Arthritis in his paternal grandmother; Asthma in his sister; Cancer in his father; Diabetes in his mother; Hypertension in his brother, daughter, father, mother, sister, and son; Lung cancer in his father.   ROS:  Please see the history of present illness.   All other systems are personally reviewed and negative.    Exam:    Vital Signs:  BP 139/86   Pulse 60   Ht 5\' 8"  (1.727 m)   Wt 186 lb (84.4 kg)   BMI 28.28 kg/m   Well sounding and appearing, alert and conversant, regular work of breathing,  good skin color Eyes- anicteric, neuro- grossly intact, skin- no apparent rash or lesions or cyanosis, mouth- oral mucosa is pink  Labs/Other Tests and Data Reviewed:    Recent Labs: 11/07/2019: ALT 43; BUN 14; Creatinine, Ser 0.79; Hemoglobin 15.3; Platelets 186.0; Potassium 3.9; Sodium 143; TSH 1.36   Wt Readings from Last 3 Encounters:  11/27/19 186 lb (84.4 kg)  11/07/19 182 lb (82.6 kg)  09/06/19 186 lb (84.4 kg)     Last device remote is  reviewed from [place] PDF which reveals RV lead failure.  He is not dependant and V paces 18 %.  He has about 1 year estimated battery longevity   ASSESSMENT & PLAN:    1.  Sick sinus syndrome Device function  Is notable for RV lead failure. We have discussed options of urgent lead replacement and watchful waiting at length today using a shared decision making process. As indication was for sinus bradycardia and he V paces only 18 %, he has opted to follow conservatively with watchful waiting.  He will decide at generator change (in a year) if he wants to have his lead replaced or a single chamber device implanted at  that time.  He is aware that he should contact my office immediately should he have any symptoms of dizzines, presyncope or syncope.  2. afib Well controlled with flecainide On xarelto for chadsd2vasc score of at least 3  3. HTN Stable No change required today  Follow-up:  Remotely and with me in a year if no symptoms occur.   Patient Risk:  after full review of this patients clinical status, I feel that they are at moderate risk at this time.  Today, I have spent 15 minutes with the patient with telehealth technology discussing arrhythmia management .    Army Fossa, MD  11/27/2019 11:33 AM     [place] [address] [phone] (office) [phone] (fax)

## 2019-11-27 NOTE — Telephone Encounter (Signed)
Spoke with patient to schedule 3 month f/u in [facility] as his PPM is not remote-capable. Scheduled for 02/27/20 at 8:30am. Pt aware to call back in the interim with any concerns. No additional questions at this time.

## 2019-12-21 ENCOUNTER — Ambulatory Visit: Payer: Medicare Other

## 2019-12-22 ENCOUNTER — Other Ambulatory Visit: Payer: Self-pay | Admitting: Internal Medicine

## 2019-12-28 ENCOUNTER — Telehealth: Payer: Self-pay

## 2019-12-28 NOTE — Telephone Encounter (Deleted)
error 

## 2020-01-07 ENCOUNTER — Encounter: Payer: Self-pay | Admitting: Internal Medicine

## 2020-01-12 ENCOUNTER — Encounter: Payer: Self-pay | Admitting: Internal Medicine

## 2020-01-12 ENCOUNTER — Telehealth: Payer: Self-pay | Admitting: Internal Medicine

## 2020-01-12 NOTE — Telephone Encounter (Signed)
New message:   Pt states he received his second vaccination yesterday and today he is experiencing some dizziness but no other symptoms per patient. Please advise.

## 2020-01-13 ENCOUNTER — Emergency Department (HOSPITAL_COMMUNITY): Payer: Medicare Other

## 2020-01-13 ENCOUNTER — Emergency Department (HOSPITAL_COMMUNITY)
Admission: EM | Admit: 2020-01-13 | Discharge: 2020-01-13 | Disposition: A | Discharge status: Home / Self Care | Payer: Medicare Other | Attending: Emergency Medicine | Admitting: Emergency Medicine

## 2020-01-13 ENCOUNTER — Encounter: Payer: Self-pay | Admitting: Internal Medicine

## 2020-01-13 ENCOUNTER — Encounter (HOSPITAL_COMMUNITY): Payer: Self-pay | Admitting: Emergency Medicine

## 2020-01-13 ENCOUNTER — Other Ambulatory Visit: Payer: Self-pay

## 2020-01-13 DIAGNOSIS — Z950 Presence of cardiac pacemaker: Secondary | ICD-10-CM | POA: Insufficient documentation

## 2020-01-13 DIAGNOSIS — I509 Heart failure, unspecified: Secondary | ICD-10-CM | POA: Insufficient documentation

## 2020-01-13 DIAGNOSIS — J9811 Atelectasis: Secondary | ICD-10-CM | POA: Diagnosis not present

## 2020-01-13 DIAGNOSIS — E860 Dehydration: Secondary | ICD-10-CM | POA: Insufficient documentation

## 2020-01-13 DIAGNOSIS — R42 Dizziness and giddiness: Secondary | ICD-10-CM | POA: Insufficient documentation

## 2020-01-13 DIAGNOSIS — Z87891 Personal history of nicotine dependence: Secondary | ICD-10-CM | POA: Diagnosis not present

## 2020-01-13 DIAGNOSIS — I110 Hypertensive heart disease with heart failure: Secondary | ICD-10-CM | POA: Insufficient documentation

## 2020-01-13 LAB — URINALYSIS, ROUTINE W REFLEX MICROSCOPIC
Bilirubin Urine: NEGATIVE
Glucose, UA: NEGATIVE mg/dL
Hgb urine dipstick: NEGATIVE
Ketones, ur: NEGATIVE mg/dL
Leukocytes,Ua: NEGATIVE
Nitrite: NEGATIVE
Protein, ur: NEGATIVE mg/dL
Specific Gravity, Urine: 1.02 (ref 1.005–1.030)
pH: 6 (ref 5.0–8.0)

## 2020-01-13 LAB — TROPONIN I (HIGH SENSITIVITY)
Troponin I (High Sensitivity): 4 ng/L (ref <18)
Troponin I (High Sensitivity): 5 ng/L (ref <18)

## 2020-01-13 LAB — COMPREHENSIVE METABOLIC PANEL
ALT: 41 U/L (ref 0–44)
AST: 29 U/L (ref 15–41)
Albumin: 4.2 g/dL (ref 3.5–5.0)
Alkaline Phosphatase: 63 U/L (ref 38–126)
Anion gap: 11 (ref 5–15)
BUN: 16 mg/dL (ref 8–23)
CO2: 23 mmol/L (ref 22–32)
Calcium: 9.6 mg/dL (ref 8.9–10.3)
Chloride: 106 mmol/L (ref 98–111)
Creatinine, Ser: 0.91 mg/dL (ref 0.61–1.24)
GFR calc Af Amer: >60 mL/min (ref >60)
GFR calc non Af Amer: >60 mL/min (ref >60)
Glucose, Bld: 120 mg/dL — ABNORMAL HIGH (ref 70–99)
Potassium: 3.8 mmol/L (ref 3.5–5.1)
Sodium: 140 mmol/L (ref 135–145)
Total Bilirubin: 0.9 mg/dL (ref 0.3–1.2)
Total Protein: 7.5 g/dL (ref 6.5–8.1)

## 2020-01-13 LAB — CBC WITH DIFFERENTIAL/PLATELET
Abs Immature Granulocytes: 0.02 10*3/uL (ref 0.00–0.07)
Basophils Absolute: 0 10*3/uL (ref 0.0–0.1)
Basophils Relative: 1 %
Eosinophils Absolute: 0.1 10*3/uL (ref 0.0–0.5)
Eosinophils Relative: 1 %
HCT: 48.8 % (ref 39.0–52.0)
Hemoglobin: 16 g/dL (ref 13.0–17.0)
Immature Granulocytes: 0 %
Lymphocytes Relative: 25 %
Lymphs Abs: 1.7 10*3/uL (ref 0.7–4.0)
MCH: 30.8 pg (ref 26.0–34.0)
MCHC: 32.8 g/dL (ref 30.0–36.0)
MCV: 93.8 fL (ref 80.0–100.0)
Monocytes Absolute: 0.8 10*3/uL (ref 0.1–1.0)
Monocytes Relative: 12 %
Neutro Abs: 4.3 10*3/uL (ref 1.7–7.7)
Neutrophils Relative %: 61 %
Platelets: 182 10*3/uL (ref 150–400)
RBC: 5.2 MIL/uL (ref 4.22–5.81)
RDW: 12.9 % (ref 11.5–15.5)
WBC: 7 10*3/uL (ref 4.0–10.5)
nRBC: 0 % (ref 0.0–0.2)

## 2020-01-13 LAB — CBG MONITORING, ED: Glucose-Capillary: 113 mg/dL — ABNORMAL HIGH (ref 70–99)

## 2020-01-13 MED ORDER — SODIUM CHLORIDE 0.9 % IV BOLUS
500.0000 mL | Freq: Once | INTRAVENOUS | Status: AC
  Administered 2020-01-13: 500 mL via INTRAVENOUS

## 2020-01-13 NOTE — ED Notes (Signed)
XR at bedside

## 2020-01-13 NOTE — ED Triage Notes (Signed)
Per EMS, patient from home, c/o dizziness x1 week. Reports dizziness worsens with movement. Ambulatory with EMS. No neuro deficits noted. Denies pain.

## 2020-01-13 NOTE — Discharge Instructions (Signed)
You were seen in the emergency department today with dizziness.  Your lab work and CT imaging are normal.  Please drink plenty of fluids and call your primary care doctor first thing on Monday to schedule the next available appointment.  Return to the ED if you develop chest pain, heart racing sensation, worsening lightheadedness, speech changes, or weakness/numbness on 1 side of your body or the other.

## 2020-01-13 NOTE — ED Notes (Signed)
Patient reports he is calling family to provide transport home.

## 2020-01-13 NOTE — ED Provider Notes (Signed)
Emergency Department Provider Note   I have reviewed the triage vital signs and the nursing notes.   HISTORY  Chief Complaint Dizziness   HPI [name] is a 79 y.o. male with PMH of HTN, HLD, CHF, SSS with [place] pacemaker presents to the ED with dizziness.  She describes dizzy type feeling for the past week which is worse with sitting up or movement.  She denies vertigo type symptoms.  He has not had falls but has experienced near syncope.  He denies any heart palpitations, shortness of breath, chest pain with these episodes.  Symptoms are not present currently but are often made worse with movement.  He has been eating and drinking normally.  No changes to his medications.  Denies any unilateral weakness or numbness.  No speech changes or difficulty swallowing.  He does feel some difficulty with walking but only when he is having dizzy symptoms.  He denies any tinnitus or pain in the ears.   Past Medical History:  Diagnosis Date  . Abdominal pain, epigastric   . Allergic rhinitis, cause unspecified   . Arthritis   . BPH (benign prostatic hyperplasia)   . CHF (congestive heart failure) ([place])   . Chronic anticoagulation    xarelto  . Coronary artery calcification seen on CAT scan 12/26/2012  . Depressive disorder, not elsewhere classified   . Diverticulosis of colon (without mention of hemorrhage)   . Esophageal reflux   . High risk medication use   . Hyperlipidemia   . Hyperplastic rectal polyp   . Hypertension   . Lumbago   . Pacemaker   . Paralyzed hemidiaphragm    LEFT  . Persistent atrial fibrillation ([place])   . Sick sinus syndrome [facility]) July 2010   s/p PPM [facility]) by [facility]    Patient Active Problem List   Diagnosis Date Noted  . Microhematuria 11/07/2019  . URI (upper respiratory infection) 03/06/2019  . Strain of lumbar region 02/10/2018  . Hematuria 11/01/2017  . Nausea vomiting and diarrhea 05/08/2017  . Lumbar radiculopathy 12/01/2016    . Dizziness 11/18/2016  . Nocturia 11/18/2016  . Hyperglycemia 11/18/2016  . Bilateral hip pain 10/20/2016  . Bilateral ankle pain 10/20/2016  . Bilateral hand pain 10/20/2016  . Fatigue 04/28/2016  . Acute sinus infection 01/28/2016  . Cough 01/28/2016  . Chronic pain 07/11/2015  . Alcohol abuse 07/01/2015  . Increased prostate specific antigen (PSA) velocity 06/26/2014  . Neck mass 12/26/2013  . Arthritis of ankle or foot, degenerative 12/16/2013  . Retrocalcaneal bursitis 12/16/2013  . Abdominal pain 11/14/2013  . Near syncope 05/06/2013  . abnormal (EKG) 05/06/2013  . Dry mouth 03/02/2013  . Coronary artery calcification seen on CAT scan 12/26/2012  . Lumbar back pain 12/26/2012  . 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  . Leg pain, right 04/17/2012  . Leg swelling 04/17/2012  . DOE (dyspnea on exertion) 02/26/2012  . Preventative health care 12/24/2011  . Cervical radiculopathy 12/24/2011  . Shortness of breath 12/18/2010  . SYNCOPE 12/04/2010  . FREQUENCY, URINARY 11/24/2010  . NECK PAIN, RIGHT 08/21/2010  . Atrial fibrillation ([place]) 05/29/2010  . Sick sinus syndrome ([place]) 05/20/2010  . NAUSEA AND VOMITING 05/20/2010  . Rhabdomyolysis 03/25/2010  . SHOULDER PAIN, RIGHT 12/09/2009  . ACHILLES TENDINITIS 12/09/2009  . BPH (benign prostatic hyperplasia) 06/26/2009  . WEIGHT LOSS-ABNORMAL 06/05/2009  . NAUSEA 06/05/2009  . PERSONAL HX COLONIC POLYPS 06/05/2009  . BRADYCARDIA 05/27/2009  . FATIGUE 05/27/2009  .  Hyperlipidemia 05/30/2008  . Depression 05/30/2008  . Essential hypertension 05/30/2008  . Allergic rhinitis 05/30/2008  . GERD 05/30/2008  . Diverticulosis of colon (without mention of hemorrhage) 05/30/2008  . OVERACTIVE BLADDER 05/30/2008  . LOW BACK PAIN 05/30/2008    Past Surgical History:  Procedure Laterality Date  . ESOPHAGOGASTRODUODENOSCOPY N/A 12/05/2013   Procedure: ESOPHAGOGASTRODUODENOSCOPY (EGD);  Surgeon: [name], MD;  Location: [facility];  Service: Endoscopy;  Laterality: N/A;  . ESOPHAGOGASTRODUODENOSCOPY N/A 07/04/2015   Procedure: ESOPHAGOGASTRODUODENOSCOPY (EGD);  Surgeon: [name], MD;  Location: [place];  Service: Endoscopy;  Laterality: N/A;  . INSERT / REPLACE / REMOVE PACEMAKER  05/2009    [employer] ALTRUA 60  (serial number [id number]) dual-chamber pacemaker  . PACEMAKER PLACEMENT Bilateral    '05    Allergies Patient has no known allergies.  Family History  Problem Relation Age of Onset  . Diabetes Mother   . Hypertension Mother   . Hypertension Father   . Lung cancer Father   . Cancer Father   . Asthma Sister   . Hypertension Sister   . Arthritis Paternal Grandmother   . Hypertension Brother   . Hypertension Daughter   . Hypertension Son     Social History Social History   Tobacco Use  . Smoking status: Former Smoker    Packs/day: 0.50    Years: 25.00    Pack years: 12.50    Types: Cigarettes    Quit date: 11/16/1978    Years since quitting: 41.1  . Smokeless tobacco: Former Systems developer    Types: Chew  Substance Use Topics  . Alcohol use: No  . Drug use: No    Review of Systems  Constitutional: No fever/chills. Positive lightheadedness with standing.  Eyes: No visual changes. ENT: No sore throat. Cardiovascular: Denies chest pain. Respiratory: Denies shortness of breath. Gastrointestinal: No abdominal pain.  No nausea, no vomiting.  No diarrhea.  No constipation. Genitourinary: Negative for dysuria. Musculoskeletal: Negative for back pain. Skin: Negative for rash. Neurological: Negative for headaches, focal weakness or numbness.  10-point ROS otherwise negative.  ____________________________________________   PHYSICAL EXAM:  VITAL SIGNS: ED Triage Vitals  Enc Vitals Group     BP 01/13/20 1640 (!) 155/90     Pulse Rate 01/13/20 1640 (!) 59     Resp 01/13/20 1640 18     Temp 01/13/20 1640 98.1 F (36.7 C)     Temp Source 01/13/20 1640 Oral     SpO2  01/13/20 1640 98 %   Constitutional: Alert and oriented. Well appearing and in no acute distress. Eyes: Conjunctivae are normal. PERRL. EOMI. No nystagmus.  Head: Atraumatic. Ears: Normal TMs and external canals bilaterally.  Nose: No congestion/rhinnorhea. Mouth/Throat: Mucous membranes are moist.  Oropharynx non-erythematous. Neck: No stridor.   Cardiovascular: Normal rate, regular rhythm. Good peripheral circulation. Grossly normal heart sounds.   Respiratory: Normal respiratory effort.  No retractions. Lungs CTAB. Gastrointestinal: Soft and nontender. No distention.  Musculoskeletal: No lower extremity tenderness nor edema. No gross deformities of extremities. Neurologic:  Normal speech and language. No gross focal neurologic deficits are appreciated. No CN deficits 2-12. Normal finger-to-nose. Normal heel-to-shin.  Skin:  Skin is warm, dry and intact. No rash noted.   ____________________________________________   LABS (all labs ordered are listed, but only abnormal results are displayed)  Labs Reviewed  COMPREHENSIVE METABOLIC PANEL - Abnormal; Notable for the following components:      Result Value   Glucose, Bld 120 (*)  All other components within normal limits  CBG MONITORING, ED - Abnormal; Notable for the following components:   Glucose-Capillary 113 (*)    All other components within normal limits  CBC WITH DIFFERENTIAL/PLATELET  URINALYSIS, ROUTINE W REFLEX MICROSCOPIC  TROPONIN I (HIGH SENSITIVITY)  TROPONIN I (HIGH SENSITIVITY)   ____________________________________________  EKG   EKG Interpretation  Date/Time:  Saturday January 13 2020 17:48:53 EST Ventricular Rate:  60 PR Interval:    QRS Duration: 107 QT Interval:  455 QTC Calculation: 455 R Axis:   -42 Text Interpretation: Sinus rhythm Left anterior fascicular block Consider anterior infarct Baseline wander in lead(s) V2 No STEMI Confirmed by Nanda Quinton [phone]) on 01/13/2020 6:00:51 PM        ____________________________________________  RADIOLOGY  CT Head Wo Contrast  Result Date: 01/13/2020 CLINICAL DATA:  79 year old male with dizziness for 1 week. EXAM: CT HEAD WITHOUT CONTRAST TECHNIQUE: Contiguous axial images were obtained from the base of the skull through the vertex without intravenous contrast. COMPARISON:  09/06/2019 and prior CTs FINDINGS: Brain: No evidence of acute infarction, hemorrhage, hydrocephalus, extra-axial collection or mass lesion/mass effect. Chronic small-vessel white matter ischemic changes again noted. Vascular: Carotid atherosclerotic calcifications are noted. Skull: Normal. Negative for fracture or focal lesion. Sinuses/Orbits: No acute finding. Other: None. IMPRESSION: 1. No evidence of acute intracranial abnormality. 2. Chronic small-vessel white matter ischemic changes. Electronically Signed   By: Margarette Canada M.D.   On: 01/13/2020 17:30   DG Chest Portable 1 View  Result Date: 01/13/2020 CLINICAL DATA:  Near syncope EXAM: PORTABLE CHEST 1 VIEW COMPARISON:  09/05/2019 FINDINGS: Signs of dual lead pacer device in place. Heart size is enlarged. Left hemidiaphragm partially obscured and elevated as on previous studies. Linear atelectatic changes are noted about the left hemidiaphragm. No signs of pleural effusion or dense consolidation. Visualized skeletal structures are unremarkable. IMPRESSION: Cardiomegaly. Linear atelectatic changes about the left hemidiaphragm. Findings unchanged compared to previous exam. Electronically Signed   By: Zetta Bills M.D.   On: 01/13/2020 17:26    ____________________________________________   PROCEDURES  Procedure(s) performed:   Procedures  None  ____________________________________________   INITIAL IMPRESSION / ASSESSMENT AND PLAN / ED COURSE  Pertinent labs & imaging results that were available during my care of the patient were reviewed by me and considered in my medical decision making (see chart  for details).   Patient presents to the emergency department for evaluation of lightheadedness mainly with standing.  No vertigo type symptoms.  Patient has a normal neurologic exam.  In sitting up in bed he does have some lightheadedness that resolves after 1 to 2 seconds.  Plan for pacemaker interrogation, labs, CT imaging but low suspicion for acute neurologic process clinically.   09:19 PM  Repeat troponin is unchanged.  Patient feeling improved after IV fluid bolus here.  Doubt central neurologic process.  Plan for discharge.  Discussed follow-up plan with the patient who is comfortable with the plan at discharge.  ____________________________________________  FINAL CLINICAL IMPRESSION(S) / ED DIAGNOSES  Final diagnoses:  Dizziness  Dehydration    MEDICATIONS GIVEN DURING THIS VISIT:  Medications  sodium chloride 0.9 % bolus 500 mL (0 mLs Intravenous Paused 01/13/20 1929)    Note:  This document was prepared using Dragon voice recognition software and may include unintentional dictation errors.  Nanda Quinton, MD, [facility]    Citlali Gautney, Wonda Olds, MD 01/13/20 2120

## 2020-01-13 NOTE — ED Notes (Signed)
Patient provided with urinal and made aware urine sample is needed. 

## 2020-01-13 NOTE — ED Notes (Signed)
ED Provider at bedside. 

## 2020-01-13 NOTE — ED Notes (Signed)
Patient transported to CT 

## 2020-01-13 NOTE — ED Notes (Signed)
L AC IV infiltrated. Unsuccessful attempt at additional IV access. Charge RN at bedside attempting IV.

## 2020-01-15 ENCOUNTER — Other Ambulatory Visit: Payer: Self-pay

## 2020-01-15 ENCOUNTER — Encounter: Payer: Self-pay | Admitting: Internal Medicine

## 2020-01-15 ENCOUNTER — Ambulatory Visit (INDEPENDENT_AMBULATORY_CARE_PROVIDER_SITE_OTHER): Payer: Medicare Other | Admitting: Internal Medicine

## 2020-01-15 VITALS — BP 124/76 | HR 82 | Temp 98.0°F | Ht 69.0 in | Wt 185.0 lb

## 2020-01-15 DIAGNOSIS — J309 Allergic rhinitis, unspecified: Secondary | ICD-10-CM

## 2020-01-15 DIAGNOSIS — R42 Dizziness and giddiness: Secondary | ICD-10-CM

## 2020-01-15 DIAGNOSIS — I10 Essential (primary) hypertension: Secondary | ICD-10-CM | POA: Diagnosis not present

## 2020-01-15 DIAGNOSIS — R739 Hyperglycemia, unspecified: Secondary | ICD-10-CM

## 2020-01-15 DIAGNOSIS — H6991 Unspecified Eustachian tube disorder, right ear: Secondary | ICD-10-CM

## 2020-01-15 MED ORDER — CLOBETASOL PROPIONATE 0.05 % EX CREA: 1.0000 "application " | TOPICAL_CREAM | Freq: Two times a day (BID) | CUTANEOUS | 2 refills | Status: DC

## 2020-01-15 MED ORDER — MECLIZINE HCL 12.5 MG PO TABS: 12.5000 mg | ORAL_TABLET | Freq: Three times a day (TID) | ORAL | 2 refills | Status: DC | PRN

## 2020-01-15 MED ORDER — PREDNISONE 10 MG PO TABS: ORAL_TABLET | ORAL | 0 refills | Status: DC

## 2020-01-15 NOTE — Progress Notes (Signed)
Subjective:    Patient ID: [name], male    DOB: December 14, 1940, 79 y.o.   MRN: [id number]  HPI   Here to f/u recent ED visit 2 days ago with dizziness with major illness ruled out; pt has head position change related dizziness such as putting eye drops in the eyes, also right ear fullness and congestion associated with Does have several wks ongoing nasal allergy symptoms with clearish congestion, itch and sneezing, without fever, pain, ST, cough, swelling or wheezing.  S/p covid shot #2 last thur.  Pt denies new neurological symptoms such as new headache, or facial or extremity weakness or numbness   Pt denies polydipsia, polyuria Past Medical History:  Diagnosis Date  . Abdominal pain, epigastric   . Allergic rhinitis, cause unspecified   . Arthritis   . BPH (benign prostatic hyperplasia)   . CHF (congestive heart failure) ([place])   . Chronic anticoagulation    xarelto  . Coronary artery calcification seen on CAT scan 12/26/2012  . Depressive disorder, not elsewhere classified   . Diverticulosis of colon (without mention of hemorrhage)   . Esophageal reflux   . High risk medication use   . Hyperlipidemia   . Hyperplastic rectal polyp   . Hypertension   . Lumbago   . Pacemaker   . Paralyzed hemidiaphragm    LEFT  . Persistent atrial fibrillation ([place])   . Sick sinus syndrome [facility]) July 2010   s/p PPM [facility]) by [name]   Past Surgical History:  Procedure Laterality Date  . ESOPHAGOGASTRODUODENOSCOPY N/A 12/05/2013   Procedure: ESOPHAGOGASTRODUODENOSCOPY (EGD);  Surgeon: [name], MD;  Location[facility];  Service: Endoscopy;  Laterality: N/A;  . ESOPHAGOGASTRODUODENOSCOPY N/A 07/04/2015   Procedure: ESOPHAGOGASTRODUODENOSCOPY (EGD);  Surgeon: [name], MD;  Location: [place];  Service: Endoscopy;  Laterality: N/A;  . INSERT / REPLACE / REMOVE PACEMAKER  05/2009    [employer] ALTRUA 60  (serial number [id number]) dual-chamber pacemaker  . PACEMAKER  PLACEMENT Bilateral    '05    reports that he quit smoking about 41 years ago. His smoking use included cigarettes. He has a 12.50 pack-year smoking history. He has quit using smokeless tobacco.  His smokeless tobacco use included chew. He reports that he does not drink alcohol or use drugs. family history includes Arthritis in his paternal grandmother; Asthma in his sister; Cancer in his father; Diabetes in his mother; Hypertension in his brother, daughter, father, mother, sister, and son; Lung cancer in his father. No Known Allergies Current Outpatient Medications on File Prior to Visit  Medication Sig Dispense Refill  . acetaminophen (TYLENOL ARTHRITIS PAIN) 650 MG CR tablet Take 650 mg by mouth every 8 (eight) hours as needed for pain.    Marland Kitchen amLODipine (NORVASC) 5 MG tablet TAKE 1 TABLET BY MOUTH EVERY DAY (Patient taking differently: Take 5 mg by mouth daily. ) 90 tablet 1  . atorvastatin (LIPITOR) 80 MG tablet TAKE 1 TABLET BY MOUTH EVERY DAY (Patient taking differently: Take 80 mg by mouth at bedtime. ) 90 tablet 3  . diclofenac sodium (VOLTAREN) 1 % GEL APPLY 4 GRAMS TOPICALLY 4 TIMES A DAY AS NEEDED (Patient taking differently: Apply 4 g topically 4 (four) times daily as needed (pain). ) 400 g 1  . dorzolamide-timolol (COSOPT) 22.3-6.8 MG/ML ophthalmic solution Place 2 drops into both eyes in the morning and at bedtime.    . fexofenadine (ALLEGRA) 180 MG tablet TAKE 1 TABLET (180 MG TOTAL)  BY MOUTH DAILY. (Patient taking differently: Take 180 mg by mouth daily. ) 90 tablet 1  . flecainide (TAMBOCOR) 100 MG tablet TAKE 1 TABLET BY MOUTH TWICE A DAY (Patient taking differently: Take 100 mg by mouth 2 (two) times daily. ) 180 tablet 3  . latanoprost (XALATAN) 0.005 % ophthalmic solution Place 1 drop into both eyes at bedtime.     . meloxicam (MOBIC) 7.5 MG tablet TAKE 1 TABLET BY MOUTH EVERY DAY AS NEEDED FOR PAIN (Patient taking differently: Take 7.5 mg by mouth daily as needed for pain. ) 30  tablet 5  . omeprazole (PRILOSEC) 40 MG capsule TAKE 1 CAPSULE BY MOUTH EVERY DAY (Patient taking differently: Take 40 mg by mouth daily. ) 90 capsule 1  . oxybutynin (DITROPAN-XL) 10 MG 24 hr tablet TAKE 1 TABLET (10 MG TOTAL) BY MOUTH DAILY. (Patient taking differently: Take 10 mg by mouth daily. ) 90 tablet 1  . rivaroxaban (XARELTO) 20 MG TABS tablet Take 1 tablet (20 mg total) by mouth daily. Annual appt is due must see provider for future refills 30 tablet 0  . tamsulosin (FLOMAX) 0.4 MG CAPS capsule TAKE 1 CAPSULE BY MOUTH TWICE A DAY (Patient taking differently: Take 0.4 mg by mouth in the morning and at bedtime. ) 180 capsule 2  . telmisartan (MICARDIS) 20 MG tablet TAKE 1 TABLET BY MOUTH EVERY DAY (Patient taking differently: Take 20 mg by mouth daily. ) 90 tablet 3  . tiZANidine (ZANAFLEX) 2 MG tablet TAKE 1 TABLET (2 MG TOTAL) BY MOUTH EVERY 6 (SIX) HOURS AS NEEDED FOR MUSCLE SPASMS. 50 tablet 2  . traMADol (ULTRAM) 50 MG tablet TAKE 1 TABLET (50 MG TOTAL) BY MOUTH EVERY 6 (SIX) HOURS AS NEEDED FOR PAIN (Patient taking differently: Take 50 mg by mouth every 6 (six) hours as needed for moderate pain or severe pain. ) 120 tablet 2  . triamcinolone (NASACORT) 55 MCG/ACT AERO nasal inhaler Place 2 sprays into the nose daily. (Patient taking differently: Place 2 sprays into the nose daily as needed (allergies). ) 1 Inhaler 12  . Vitamin D, Ergocalciferol, (DRISDOL) 1.25 MG (50000 UT) CAPS capsule Take 1 capsule (50,000 Units total) by mouth every 7 (seven) days. 12 capsule 0   No current facility-administered medications on file prior to visit.   Review of Systems All otherwise neg per pt     Objective:   Physical Exam BP 124/76   Pulse 82   Temp 98 F (36.7 C)   Ht 5\' 9"  (1.753 m)   Wt 185 lb (83.9 kg)   SpO2 98%   BMI 27.32 kg/m  VS noted,  Constitutional: Pt appears in NAD HENT: Head: NCAT.  Right Ear: External ear normal.  Left Ear: External ear normal.  Eyes: . Pupils are  equal, round, and reactive to light. Conjunctivae and EOM are normal bilat tm's with pink erythema and post effusion Nose: without d/c or deformity Neck: Neck supple. Gross normal ROM Cardiovascular: Normal rate and regular rhythm.   Pulmonary/Chest: Effort normal and breath sounds without rales or wheezing.  Abd:  Soft, NT, ND, + BS, no organomegaly Neurological: Pt is alert. At baseline orientation, motor grossly intact Skin: Skin is warm. No rashes, other new lesions, no LE edema Psychiatric: Pt behavior is normal without agitation  All otherwise neg per pt  Lab Results  Component Value Date   WBC 7.0 01/13/2020   HGB 16.0 01/13/2020   HCT 48.8 01/13/2020   PLT 182  01/13/2020   GLUCOSE 120 (H) 01/13/2020   CHOL 185 11/07/2019   TRIG 59.0 11/07/2019   HDL 75.00 11/07/2019   LDLDIRECT 183.4 12/26/2013   LDLCALC 98 11/07/2019   ALT 41 01/13/2020   AST 29 01/13/2020   NA 140 01/13/2020   K 3.8 01/13/2020   CL 106 01/13/2020   CREATININE 0.91 01/13/2020   BUN 16 01/13/2020   CO2 23 01/13/2020   TSH 1.36 11/07/2019   PSA 1.76 11/07/2019   INR 1.45 07/01/2015   HGBA1C 6.2 11/07/2019      Assessment & Plan:

## 2020-01-15 NOTE — Assessment & Plan Note (Addendum)
C/w vertigo type,- for meclizine prn,  to f/u any worsening symptoms or concerns  I spent 33 minutes in preparing to see the patient by review of recent labs, imaging and procedures, obtaining and reviewing separately obtained history, communicating with the patient and family or caregiver, ordering medications, tests or procedures, and documenting clinical information in the EHR including the differential Dx, treatment, and any further evaluation and other management of dizziness, eustachian tube dysfxn, hyperglycemia, allergic rhinitis, and hTN

## 2020-01-15 NOTE — Assessment & Plan Note (Signed)
For mucinex otc bid prn,  to f/u any worsening symptoms or concerns

## 2020-01-15 NOTE — Assessment & Plan Note (Signed)
stable overall by history and exam, recent data reviewed with pt, and pt to continue medical treatment as before,  to f/u any worsening symptoms or concerns  

## 2020-01-15 NOTE — Assessment & Plan Note (Signed)
Also for predpac asd, . to f/u any worsening symptoms or concerns 

## 2020-01-15 NOTE — Patient Instructions (Addendum)
Please take all new medication as prescribed - the meclizine as needed for dizziness, mucinex otc for ear tube congestion, and prednisone to help calm the allergies  Please continue all other medications as before, and refills have been done if requested - the cream.  Please have the pharmacy call with any other refills you may need.  Please continue your efforts at being more active, low cholesterol diet, and weight control.  Please keep your appointments with your specialists as you may have planned

## 2020-01-16 NOTE — Telephone Encounter (Signed)
Patient came in for appointment dealing with these symptoms on 01-15-2020

## 2020-01-21 ENCOUNTER — Other Ambulatory Visit: Payer: Self-pay | Admitting: Internal Medicine

## 2020-01-21 NOTE — Telephone Encounter (Signed)
No need further high dose Vit D  Please change to OTC Vitamin D3 at 2000 units per day, indefinitely.

## 2020-01-23 DIAGNOSIS — H401131 Primary open-angle glaucoma, bilateral, mild stage: Secondary | ICD-10-CM | POA: Diagnosis not present

## 2020-01-23 DIAGNOSIS — H2513 Age-related nuclear cataract, bilateral: Secondary | ICD-10-CM | POA: Diagnosis not present

## 2020-01-29 ENCOUNTER — Other Ambulatory Visit: Payer: Self-pay | Admitting: Internal Medicine

## 2020-01-29 ENCOUNTER — Other Ambulatory Visit: Payer: Self-pay

## 2020-01-29 DIAGNOSIS — H401131 Primary open-angle glaucoma, bilateral, mild stage: Secondary | ICD-10-CM | POA: Diagnosis not present

## 2020-01-29 NOTE — Telephone Encounter (Signed)
Please refill as per office routine med refill policy (all routine meds refilled for 3 mo or monthly per pt preference up to one year from last visit, then month to month grace period for 3 mo, then further med refills will have to be denied)  

## 2020-02-08 ENCOUNTER — Ambulatory Visit (INDEPENDENT_AMBULATORY_CARE_PROVIDER_SITE_OTHER): Payer: Medicare Other

## 2020-02-08 ENCOUNTER — Other Ambulatory Visit: Payer: Self-pay

## 2020-02-08 VITALS — BP 138/80 | HR 60 | Temp 98.4°F | Resp 16 | Ht 69.0 in | Wt 191.4 lb

## 2020-02-08 DIAGNOSIS — Z0000 Encounter for general adult medical examination without abnormal findings: Secondary | ICD-10-CM | POA: Diagnosis not present

## 2020-02-08 NOTE — Patient Instructions (Addendum)
[name] , Thank you for taking time to come for your Medicare Wellness Visit. I appreciate your ongoing commitment to your health goals. Please review the following plan we discussed and let me know if I can assist you in the future.   Screening recommendations/referrals: Colorectal Screening: 12/29/2017  Vision and Dental Exams: Recommended annual ophthalmology exams for early detection of glaucoma and other disorders of the eye Recommended annual dental exams for proper oral hygiene  Vaccinations: Influenza vaccine: 07/16/2019 Pneumococcal vaccine: 09/26/2013, 09/16/2010 Tdap vaccine: 11/07/2019  Shingles vaccine: 12/19/2018, 05/27/2019 Covid vaccine: 12/21/2019, 01/11/2020 ([place])  Advanced directives: Advance directives discussed with you today.  You have received the documents for completion.  Goals: Recommend to drink at least 6-8 8oz glasses of water per day.  Recommend to exercise for at least 150 minutes per week.  Recommend to remove any items from the home that may cause slips or trips.  Recommend to decrease portion sizes by eating 3 small healthy meals and at least 2 healthy snacks per day.  Recommend to begin DASH diet as directed below  Next appointment: Please schedule your Annual Wellness Visit with your Nurse Health Advisor in one year.  Preventive Care 77 Years and Older, Male Preventive care refers to lifestyle choices and visits with your health care provider that can promote health and wellness. What does preventive care include?  A yearly physical exam. This is also called an annual well check.  Dental exams once or twice a year.  Routine eye exams. Ask your health care provider how often you should have your eyes checked.  Personal lifestyle choices, including:  Daily care of your teeth and gums.  Regular physical activity.  Eating a healthy diet.  Avoiding tobacco and drug use.  Limiting alcohol use.  Practicing safe sex.  Taking low doses of  aspirin every day if recommended by your health care provider..  Taking vitamin and mineral supplements as recommended by your health care provider. What happens during an annual well check? The services and screenings done by your health care provider during your annual well check will depend on your age, overall health, lifestyle risk factors, and family history of disease. Counseling  Your health care provider may ask you questions about your:  Alcohol use.  Tobacco use.  Drug use.  Emotional well-being.  Home and relationship well-being.  Sexual activity.  Eating habits.  History of falls.  Memory and ability to understand (cognition).  Work and work Statistician. Screening  You may have the following tests or measurements:  Height, weight, and BMI.  Blood pressure.  Lipid and cholesterol levels. These may be checked every 5 years, or more frequently if you are over 53 years old.  Skin check.  Lung cancer screening. You may have this screening every year starting at age 59 if you have a 30-pack-year history of smoking and currently smoke or have quit within the past 15 years.  Fecal occult blood test (FOBT) of the stool. You may have this test every year starting at age 41.  Flexible sigmoidoscopy or colonoscopy. You may have a sigmoidoscopy every 5 years or a colonoscopy every 10 years starting at age 1.  Prostate cancer screening. Recommendations will vary depending on your family history and other risks.  Hepatitis C blood test.  Hepatitis B blood test.  Sexually transmitted disease (STD) testing.  Diabetes screening. This is done by checking your blood sugar (glucose) after you have not eaten for a while (fasting). You may  have this done every 1-3 years.  Abdominal aortic aneurysm (AAA) screening. You may need this if you are a current or former smoker.  Osteoporosis. You may be screened starting at age 26 if you are at high risk. Talk with your health  care provider about your test results, treatment options, and if necessary, the need for more tests. Vaccines  Your health care provider may recommend certain vaccines, such as:  Influenza vaccine. This is recommended every year.  Tetanus, diphtheria, and acellular pertussis (Tdap, Td) vaccine. You may need a Td booster every 10 years.  Zoster vaccine. You may need this after age 80.  Pneumococcal 13-valent conjugate (PCV13) vaccine. One dose is recommended after age 72.  Pneumococcal polysaccharide (PPSV23) vaccine. One dose is recommended after age 29. Talk to your health care provider about which screenings and vaccines you need and how often you need them. This information is not intended to replace advice given to you by your health care provider. Make sure you discuss any questions you have with your health care provider. Document Released: 11/29/2015 Document Revised: 07/22/2016 Document Reviewed: 09/03/2015 Elsevier Interactive Patient Education  2017 [place] Prevention in the Home Falls can cause injuries. They can happen to people of all ages. There are many things you can do to make your home safe and to help prevent falls. What can I do on the outside of my home?  Regularly fix the edges of walkways and driveways and fix any cracks.  Remove anything that might make you trip as you walk through a door, such as a raised step or threshold.  Trim any bushes or trees on the path to your home.  Use bright outdoor lighting.  Clear any walking paths of anything that might make someone trip, such as rocks or tools.  Regularly check to see if handrails are loose or broken. Make sure that both sides of any steps have handrails.  Any raised decks and porches should have guardrails on the edges.  Have any leaves, snow, or ice cleared regularly.  Use sand or salt on walking paths during winter.  Clean up any spills in your garage right away. This includes oil or  grease spills. What can I do in the bathroom?  Use night lights.  Install grab bars by the toilet and in the tub and shower. Do not use towel bars as grab bars.  Use non-skid mats or decals in the tub or shower.  If you need to sit down in the shower, use a plastic, non-slip stool.  Keep the floor dry. Clean up any water that spills on the floor as soon as it happens.  Remove soap buildup in the tub or shower regularly.  Attach bath mats securely with double-sided non-slip rug tape.  Do not have throw rugs and other things on the floor that can make you trip. What can I do in the bedroom?  Use night lights.  Make sure that you have a light by your bed that is easy to reach.  Do not use any sheets or blankets that are too big for your bed. They should not hang down onto the floor.  Have a firm chair that has side arms. You can use this for support while you get dressed.  Do not have throw rugs and other things on the floor that can make you trip. What can I do in the kitchen?  Clean up any spills right away.  Avoid walking on wet  floors.  Keep items that you use a lot in easy-to-reach places.  If you need to reach something above you, use a strong step stool that has a grab bar.  Keep electrical cords out of the way.  Do not use floor polish or wax that makes floors slippery. If you must use wax, use non-skid floor wax.  Do not have throw rugs and other things on the floor that can make you trip. What can I do with my stairs?  Do not leave any items on the stairs.  Make sure that there are handrails on both sides of the stairs and use them. Fix handrails that are broken or loose. Make sure that handrails are as long as the stairways.  Check any carpeting to make sure that it is firmly attached to the stairs. Fix any carpet that is loose or worn.  Avoid having throw rugs at the top or bottom of the stairs. If you do have throw rugs, attach them to the floor with  carpet tape.  Make sure that you have a light switch at the top of the stairs and the bottom of the stairs. If you do not have them, ask someone to add them for you. What else can I do to help prevent falls?  Wear shoes that:  Do not have high heels.  Have rubber bottoms.  Are comfortable and fit you well.  Are closed at the toe. Do not wear sandals.  If you use a stepladder:  Make sure that it is fully opened. Do not climb a closed stepladder.  Make sure that both sides of the stepladder are locked into place.  Ask someone to hold it for you, if possible.  Clearly mark and make sure that you can see:  Any grab bars or handrails.  First and last steps.  Where the edge of each step is.  Use tools that help you move around (mobility aids) if they are needed. These include:  Canes.  Walkers.  Scooters.  Crutches.  Turn on the lights when you go into a dark area. Replace any light bulbs as soon as they burn out.  Set up your furniture so you have a clear path. Avoid moving your furniture around.  If any of your floors are uneven, fix them.  If there are any pets around you, be aware of where they are.  Review your medicines with your doctor. Some medicines can make you feel dizzy. This can increase your chance of falling. Ask your doctor what other things that you can do to help prevent falls. This information is not intended to replace advice given to you by your health care provider. Make sure you discuss any questions you have with your health care provider. Document Released: 08/29/2009 Document Revised: 04/09/2016 Document Reviewed: 12/07/2014 Elsevier Interactive Patient Education  2017 [employer].

## 2020-02-08 NOTE — Progress Notes (Signed)
Subjective:   [name] is a 79 y.o. male who presents for Medicare Annual/Subsequent preventive examination.  Review of Systems:   Cardiac Risk Factors include: advanced age (>35men, >84 women);hypertension;male gender     Objective:    Vitals: BP 138/80 (BP Location: Left Arm, Patient Position: Sitting, Cuff Size: Normal)   Pulse 60   Temp 98.4 F (36.9 C)   Resp 16   Ht 5\' 9"  (1.753 m)   Wt 191 lb 6.4 oz (86.8 kg)   SpO2 98%   BMI 28.26 kg/m   Body mass index is 28.26 kg/m.  Advanced Directives 02/08/2020 02/08/2020 01/13/2020 09/06/2019 03/05/2019 12/19/2018 12/29/2017  Does Patient Have a Medical Advance Directive? No No No No No No Yes  Type of Advance Directive - - - - - - Press photographer;Living will  Does patient want to make changes to medical advance directive? - - - - - Yes (ED - Information included in AVS) No - Patient declined  Copy of [place] in Chart? - - - - - - No - copy requested  Would patient like information on creating a medical advance directive? No - Patient declined No - Patient declined - - No - Patient declined - No - Patient declined    Tobacco Social History   Tobacco Use  Smoking Status Former Smoker  . Packs/day: 0.50  . Years: 25.00  . Pack years: 12.50  . Types: Cigarettes  . Quit date: 11/16/1978  . Years since quitting: 41.2  Smokeless Tobacco Former Systems developer  . Types: Chew     Counseling given: Not Answered   Clinical Intake:  Pre-visit preparation completed: Yes  Pain : No/denies pain Pain Score: 0-No pain     Nutritional Risks: None Diabetes: No  How often do you need to have someone help you when you read instructions, pamphlets, or other written materials from your doctor or pharmacy?: 1 - Never  Interpreter Needed?: No     Past Medical History:  Diagnosis Date  . Abdominal pain, epigastric   . Allergic rhinitis, cause unspecified   . Arthritis   . BPH (benign prostatic  hyperplasia)   . CHF (congestive heart failure) ([place])   . Chronic anticoagulation    xarelto  . Coronary artery calcification seen on CAT scan 12/26/2012  . Depressive disorder, not elsewhere classified   . Diverticulosis of colon (without mention of hemorrhage)   . Esophageal reflux   . High risk medication use   . Hyperlipidemia   . Hyperplastic rectal polyp   . Hypertension   . Lumbago   . Pacemaker   . Paralyzed hemidiaphragm    LEFT  . Persistent atrial fibrillation ([place])   . Sick sinus syndrome [facility]) July 2010   s/p PPM [facility]) by [name]   Past Surgical History:  Procedure Laterality Date  . ESOPHAGOGASTRODUODENOSCOPY N/A 12/05/2013   Procedure: ESOPHAGOGASTRODUODENOSCOPY (EGD);  Surgeon: [name], MD;  Location: [facility];  Service: Endoscopy;  Laterality: N/A;  . ESOPHAGOGASTRODUODENOSCOPY N/A 07/04/2015   Procedure: ESOPHAGOGASTRODUODENOSCOPY (EGD);  Surgeon: [name], MD;  Location: [facility];  Service: Endoscopy;  Laterality: N/A;  . INSERT / REPLACE / REMOVE PACEMAKER  05/2009    [employer] ALTRUA 60  (serial number [id number]) dual-chamber pacemaker  . PACEMAKER PLACEMENT Bilateral    '05   Family History  Problem Relation Age of Onset  . Diabetes Mother   . Hypertension Mother   .  Hypertension Father   . Lung cancer Father   . Cancer Father   . Asthma Sister   . Hypertension Sister   . Arthritis Paternal Grandmother   . Hypertension Brother   . Hypertension Daughter   . Hypertension Son    Social History   Socioeconomic History  . Marital status: Married    Spouse name: Not on file  . Number of children: 4  . Years of education: Not on file  . Highest education level: Not on file  Occupational History  . Occupation: retired    Fish farm manager: RETIRED  Tobacco Use  . Smoking status: Former Smoker    Packs/day: 0.50    Years: 25.00    Pack years: 12.50    Types: Cigarettes    Quit date: 11/16/1978    Years since quitting:  41.2  . Smokeless tobacco: Former Systems developer    Types: Chew  Substance and Sexual Activity  . Alcohol use: No  . Drug use: No  . Sexual activity: Never  Other Topics Concern  . 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    Social Determinants of Health   Financial Resource Strain:   . Difficulty of Paying Living Expenses:   Food Insecurity:   . Worried About Charity fundraiser in the Last Year:   . Arboriculturist in the Last Year:   Transportation Needs:   . Film/video editor (Medical):   Marland Kitchen Lack of Transportation (Non-Medical):   Physical Activity:   . Days of Exercise per Week:   . Minutes of Exercise per Session:   Stress:   . Feeling of Stress :   Social Connections:   . Frequency of Communication with Friends and Family:   . Frequency of Social Gatherings with Friends and Family:   . Attends Religious Services:   . Active Member of Clubs or Organizations:   . Attends Archivist Meetings:   Marland Kitchen Marital Status:     Outpatient Encounter Medications as of 02/08/2020  Medication Sig  . acetaminophen (TYLENOL ARTHRITIS PAIN) 650 MG CR tablet Take 650 mg by mouth every 8 (eight) hours as needed for pain.  Marland Kitchen amLODipine (NORVASC) 5 MG tablet TAKE 1 TABLET BY MOUTH EVERY DAY (Patient taking differently: Take 5 mg by mouth daily. )  . atorvastatin (LIPITOR) 80 MG tablet TAKE 1 TABLET BY MOUTH EVERY DAY (Patient taking differently: Take 80 mg by mouth at bedtime. )  . clobetasol cream (TEMOVATE) [id number] % Apply 1 application topically 2 (two) times daily.  . diclofenac sodium (VOLTAREN) 1 % GEL APPLY 4 GRAMS TOPICALLY 4 TIMES A DAY AS NEEDED (Patient taking differently: Apply 4 g topically 4 (four) times daily as needed (pain). )  . dorzolamide-timolol (COSOPT) 22.3-6.8 MG/ML ophthalmic solution Place 2 drops into both eyes in the morning and at bedtime.  . fexofenadine (ALLEGRA) 180 MG tablet TAKE 1 TABLET (180 MG TOTAL) BY MOUTH DAILY. (Patient  taking differently: Take 180 mg by mouth daily. )  . flecainide (TAMBOCOR) 100 MG tablet TAKE 1 TABLET BY MOUTH TWICE A DAY (Patient taking differently: Take 100 mg by mouth 2 (two) times daily. )  . latanoprost (XALATAN) 0.005 % ophthalmic solution Place 1 drop into both eyes at bedtime.   . meclizine (ANTIVERT) 12.5 MG tablet Take 1 tablet (12.5 mg total) by mouth 3 (three) times daily as needed for dizziness.  . meloxicam (MOBIC) 7.5 MG  tablet TAKE 1 TABLET BY MOUTH EVERY DAY AS NEEDED FOR PAIN (Patient taking differently: Take 7.5 mg by mouth daily as needed for pain. )  . omeprazole (PRILOSEC) 40 MG capsule TAKE 1 CAPSULE BY MOUTH EVERY DAY (Patient taking differently: Take 40 mg by mouth daily. )  . oxybutynin (DITROPAN-XL) 10 MG 24 hr tablet TAKE 1 TABLET (10 MG TOTAL) BY MOUTH DAILY. (Patient taking differently: Take 10 mg by mouth daily. )  . predniSONE (DELTASONE) 10 MG tablet 3 tabs by mouth per day for 3 days,2tabs per day for 3 days,1tab per day for 3 days  . rivaroxaban (XARELTO) 20 MG TABS tablet Take 1 tablet (20 mg total) by mouth daily with supper.  . tamsulosin (FLOMAX) 0.4 MG CAPS capsule TAKE 1 CAPSULE BY MOUTH TWICE A DAY (Patient taking differently: Take 0.4 mg by mouth in the morning and at bedtime. )  . telmisartan (MICARDIS) 20 MG tablet TAKE 1 TABLET BY MOUTH EVERY DAY (Patient taking differently: Take 20 mg by mouth daily. )  . tiZANidine (ZANAFLEX) 2 MG tablet TAKE 1 TABLET (2 MG TOTAL) BY MOUTH EVERY 6 (SIX) HOURS AS NEEDED FOR MUSCLE SPASMS.  Marland Kitchen traMADol (ULTRAM) 50 MG tablet TAKE 1 TABLET (50 MG TOTAL) BY MOUTH EVERY 6 (SIX) HOURS AS NEEDED FOR PAIN (Patient taking differently: Take 50 mg by mouth every 6 (six) hours as needed for moderate pain or severe pain. )  . triamcinolone (NASACORT) 55 MCG/ACT AERO nasal inhaler Place 2 sprays into the nose daily. (Patient taking differently: Place 2 sprays into the nose daily as needed (allergies). )  . Vitamin D, Ergocalciferol,  (DRISDOL) 1.25 MG (50000 UNIT) CAPS capsule TAKE 1 CAPSULE (50,000 UNITS TOTAL) BY MOUTH EVERY 7 (SEVEN) DAYS.   No facility-administered encounter medications on file as of 02/08/2020.    Activities of Daily Living In your present state of health, do you have any difficulty performing the following activities: 02/08/2020  Hearing? N  Vision? N  Difficulty concentrating or making decisions? N  Walking or climbing stairs? N  Dressing or bathing? N  Doing errands, shopping? N  Preparing Food and eating ? N  Using the Toilet? N  In the past six months, have you accidently leaked urine? N  Do you have problems with loss of bowel control? N  Managing your Medications? N  Managing your Finances? N  Housekeeping or managing your Housekeeping? N  Some recent data might be hidden    Patient Care Team: Biagio Borg, MD as PCP - Bethena Roys, MD as Attending Physician (Cardiology) Clent Jacks, MD as Consulting Physician (Ophthalmology)   Assessment:   This is a routine wellness examination for Trung.  Exercise Activities and Dietary recommendations Current Exercise Habits: The patient has a physically strenuous job, but has no regular exercise apart from work.Grace Blight), Exercise limited by: cardiac condition(s)(has a pacemaker)  Goals    . <enter goal here> (pt-stated)     Live healthier by making healthy food choices and maintain or increase activity level.    . Client understands the importance of follow-up with providers by attending scheduled visits    . Patient Stated     Monitor my diet to decrease sugars, carbohydrates, and continue to not use a lot of salt. Start to drink more water daily. Go fishing a much as possible, enjoy life and family.    . Patient Stated     I want to eat better by cutting back on bread,  white starches, and desserts.       Fall Risk Fall Risk  02/08/2020 11/07/2019 11/07/2019 03/06/2019 12/19/2018  Falls in the past year? 1 1 1  0 0  Number  falls in past yr: 0 0 1 - 0  Injury with Fall? 1 1 1  - -  Comment Had to have stitches - head injury - -  Risk for fall due to : Impaired balance/gait - - - Impaired balance/gait;Other (Comment)  Risk for fall due to: Comment - - - - feels dizzy at times   Follow up Falls evaluation completed;Falls prevention discussed - - - -   Is the patient's home free of loose throw rugs in walkways, pet beds, electrical cords, etc?   yes      Grab bars in the bathroom? yes      Handrails on the stairs?   yes      Adequate lighting?   yes  Timed Get Up and Go Performed:   Depression Screen PHQ 2/9 Scores 02/08/2020 11/07/2019 03/06/2019 12/19/2018  PHQ - 2 Score 0 0 0 0  PHQ- 9 Score - - - -  Exception Documentation Other- indicate reason in comment box - - -    Cognitive Function MMSE - Mini Mental State Exam 12/16/2017  Orientation to time 5  Orientation to Place 5  Registration 3  Attention/ Calculation 5  Recall 2  Language- name 2 objects 2  Language- repeat 1  Language- follow 3 step command 3  Language- read & follow direction 1  Write a sentence 1  Copy design 1  Total score 29     6CIT Screen 02/08/2020  What Year? 0 points  What month? 0 points  What time? 0 points  Count back from 20 0 points  Months in reverse 0 points  Repeat phrase 0 points  Total Score 0    Immunization History  Administered Date(s) Administered  . Influenza Split 08/16/2012, 08/17/2012, 08/16/2013  . Influenza Whole 08/16/2010, 08/17/2011  . Influenza, High Dose Seasonal PF 07/17/2018  . Influenza,inj,Quad PF,6+ Mos 07/14/2019  . Influenza-Unspecified 08/11/2015, 07/17/2016  . Pneumococcal Conjugate-13 09/26/2013  . Pneumococcal Polysaccharide-23 10/25/2006, 09/16/2010  . Td 05/27/2009  . Tdap 11/07/2019  . Zoster 10/25/2006  . Zoster Recombinat (Shingrix) 12/19/2018, 05/27/2019    Screening Tests Health Maintenance  Topic Date Due  . COLONOSCOPY  12/29/2022  . TETANUS/TDAP  11/06/2029   . INFLUENZA VACCINE  Completed  . PNA vac Low Risk Adult  Completed   Cancer Screenings: Lung: Low Dose CT Chest recommended if Age 49-80 years, 30 pack-year currently smoking OR have quit w/in 15years. Patient does not qualify. Colorectal: up to date; due 12/28/2027    Plan:    I have personally reviewed and noted the following in the patient's chart:   . Medical and social history . Use of alcohol, tobacco or illicit drugs  . Current medications and supplements . Functional ability and status . Nutritional status . Physical activity . Advanced directives . List of other physicians . Hospitalizations, surgeries, and ER visits in previous 12 months . Vitals . Screenings to include cognitive, depression, and falls . Referrals and appointments  In addition, I have reviewed and discussed with patient certain preventive protocols, quality metrics, and best practice recommendations. A written personalized care plan for preventive services as well as general preventive health recommendations were provided to patient.     Sheral Flow, LPN  [id number]

## 2020-02-12 DIAGNOSIS — H401131 Primary open-angle glaucoma, bilateral, mild stage: Secondary | ICD-10-CM | POA: Diagnosis not present

## 2020-02-27 ENCOUNTER — Other Ambulatory Visit: Payer: Self-pay

## 2020-02-27 ENCOUNTER — Ambulatory Visit (INDEPENDENT_AMBULATORY_CARE_PROVIDER_SITE_OTHER): Payer: Medicare Other | Admitting: *Deleted

## 2020-02-27 DIAGNOSIS — R55 Syncope and collapse: Secondary | ICD-10-CM | POA: Diagnosis not present

## 2020-02-27 LAB — CUP PACEART INCLINIC DEVICE CHECK
Brady Statistic RA Percent Paced: 90 %
Brady Statistic RV Percent Paced: 0 %
Date Time Interrogation Session: 20210413000000
Implantable Lead Implant Date: 20100719
Implantable Lead Implant Date: 20100719
Implantable Lead Location: 753859
Implantable Lead Location: 753860
Implantable Lead Model: 4136
Implantable Lead Model: 4137
Implantable Lead Serial Number: 28454141
Implantable Lead Serial Number: 28594836
Implantable Pulse Generator Implant Date: 20100719
Lead Channel Pacing Threshold Amplitude: 0.4 V
Lead Channel Pacing Threshold Pulse Width: 0.4 ms
Lead Channel Sensing Intrinsic Amplitude: 4.5 mV
Lead Channel Setting Pacing Amplitude: 2 V
Pulse Gen Serial Number: 764370

## 2020-02-27 NOTE — Patient Instructions (Addendum)
Follow up with primary care doctor for dizziness relievedd by Meclizine. Will schedule for appointment with Dr Rayann Heman due to increased SOB with activity and decreased energy level x 2 months. Scheduled appointment with Dr Rayann Heman 03/27/20 at 3:30 pm. Go to the ED in you have chest pain, increased shortness of breath , or pass out. Follow up in device clinic for pacer check in July.

## 2020-02-27 NOTE — Progress Notes (Signed)
Pacemaker check in clinic. Normal device function. 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 Thresholds, sensing, impedances consistent with previous measurements. Device programmed to maximize longevity. No mode switch. Device programmed at appropriate safety margins. Histogram distribution appropriate for patient activity level. Device programmed to optimize intrinsic conduction. Estimated longevity 1 year. Patient not enrolled in remote follow up. Next in clinic check 05/28/20. Follow up scheduled with DR Allred for 03/27/20 to assess increased SOB and decreased energy levels. Patient was offered a virtual appointment with Dr Rayann Heman on 02/28/20 and declined appointment. ED precautions given. Patient education completed.

## 2020-02-28 ENCOUNTER — Telehealth: Payer: Medicare Other | Admitting: Internal Medicine

## 2020-02-29 ENCOUNTER — Other Ambulatory Visit: Payer: Self-pay | Admitting: Internal Medicine

## 2020-03-14 ENCOUNTER — Other Ambulatory Visit: Payer: Self-pay | Admitting: Internal Medicine

## 2020-03-14 NOTE — Telephone Encounter (Signed)
Done erx 

## 2020-03-25 DIAGNOSIS — H401131 Primary open-angle glaucoma, bilateral, mild stage: Secondary | ICD-10-CM | POA: Diagnosis not present

## 2020-03-27 ENCOUNTER — Ambulatory Visit (INDEPENDENT_AMBULATORY_CARE_PROVIDER_SITE_OTHER): Payer: Medicare Other | Admitting: Internal Medicine

## 2020-03-27 ENCOUNTER — Encounter: Payer: Self-pay | Admitting: Internal Medicine

## 2020-03-27 ENCOUNTER — Encounter: Payer: Self-pay | Admitting: *Deleted

## 2020-03-27 ENCOUNTER — Other Ambulatory Visit: Payer: Self-pay

## 2020-03-27 VITALS — BP 128/88 | HR 60 | Ht 69.0 in

## 2020-03-27 DIAGNOSIS — I495 Sick sinus syndrome: Secondary | ICD-10-CM | POA: Diagnosis not present

## 2020-03-27 DIAGNOSIS — I119 Hypertensive heart disease without heart failure: Secondary | ICD-10-CM | POA: Diagnosis not present

## 2020-03-27 DIAGNOSIS — Z950 Presence of cardiac pacemaker: Secondary | ICD-10-CM

## 2020-03-27 DIAGNOSIS — I480 Paroxysmal atrial fibrillation: Secondary | ICD-10-CM | POA: Diagnosis not present

## 2020-03-27 DIAGNOSIS — R0602 Shortness of breath: Secondary | ICD-10-CM

## 2020-03-27 DIAGNOSIS — D6869 Other thrombophilia: Secondary | ICD-10-CM | POA: Diagnosis not present

## 2020-03-27 NOTE — Progress Notes (Signed)
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  Since last being seen in our clinic, the patient reports doing very well.  He has chronic SOB and fatigue.  Today, he denies symptoms of palpitations, chest pain, lower extremity edema, dizziness, presyncope, or syncope.  The patient is otherwise without complaint today.   Past Medical History:  Diagnosis Date  . Abdominal pain, epigastric   . Allergic rhinitis, cause unspecified   . Arthritis   . BPH (benign prostatic hyperplasia)   . CHF (congestive heart failure) ([name])   . Chronic anticoagulation    xarelto  . Coronary artery calcification seen on CAT scan 12/26/2012  . Depressive disorder, not elsewhere classified   . Diverticulosis of colon (without mention of hemorrhage)   . Esophageal reflux   . High risk medication use   . Hyperlipidemia   . Hyperplastic rectal polyp   . Hypertension   . Lumbago   . Pacemaker   . Paralyzed hemidiaphragm    LEFT  . Persistent atrial fibrillation ([place])   . Sick sinus syndrome [name]) July 2010   s/p PPM [name] by [name]   Past Surgical History:  Procedure Laterality Date  . ESOPHAGOGASTRODUODENOSCOPY N/A 12/05/2013   Procedure: ESOPHAGOGASTRODUODENOSCOPY (EGD);  Surgeon: [name] Dragon, MD;  Location: [facility];  Service: Endoscopy;  Laterality: N/A;  . ESOPHAGOGASTRODUODENOSCOPY N/A 07/04/2015   Procedure: ESOPHAGOGASTRODUODENOSCOPY (EGD);  Surgeon: [name] Castle, MD;  Location: [facility];  Service: Endoscopy;  Laterality: N/A;  . INSERT / REPLACE / REMOVE PACEMAKER  05/2009    [employer] ALTRUA 60  (serial number [id number]) dual-chamber pacemaker  . PACEMAKER PLACEMENT Bilateral    '05    ROS- all systems are reviewed and negative except as per HPI above  Current Outpatient Medications  Medication Sig Dispense Refill  . acetaminophen (TYLENOL ARTHRITIS PAIN) 650 MG CR  tablet Take 650 mg by mouth every 8 (eight) hours as needed for pain.    Marland Kitchen amLODipine (NORVASC) 5 MG tablet TAKE 1 TABLET BY MOUTH EVERY DAY 90 tablet 1  . atorvastatin (LIPITOR) 80 MG tablet TAKE 1 TABLET BY MOUTH EVERY DAY 90 tablet 3  . clobetasol cream (TEMOVATE) [id number] % Apply 1 application topically 2 (two) times daily. 30 g 2  . diclofenac sodium (VOLTAREN) 1 % GEL APPLY 4 GRAMS TOPICALLY 4 TIMES A DAY AS NEEDED 400 g 1  . dorzolamide-timolol (COSOPT) 22.3-6.8 MG/ML ophthalmic solution Place 2 drops into both eyes in the morning and at bedtime.    . fexofenadine (ALLEGRA) 180 MG tablet TAKE 1 TABLET (180 MG TOTAL) BY MOUTH DAILY. 90 tablet 1  . flecainide (TAMBOCOR) 100 MG tablet TAKE 1 TABLET BY MOUTH TWICE A DAY 180 tablet 3  . latanoprost (XALATAN) 0.005 % ophthalmic solution Place 1 drop into both eyes at bedtime.     . meclizine (ANTIVERT) 12.5 MG tablet TAKE 1 TABLET (12.5 MG TOTAL) BY MOUTH 3 (THREE) TIMES DAILY AS NEEDED FOR DIZZINESS. 30 tablet 2  . meloxicam (MOBIC) 7.5 MG tablet TAKE 1 TABLET BY MOUTH EVERY DAY AS NEEDED FOR PAIN 30 tablet 5  . omeprazole (PRILOSEC) 40 MG capsule TAKE 1 CAPSULE BY MOUTH EVERY DAY 90 capsule 1  . oxybutynin (DITROPAN-XL) 10 MG 24 hr tablet TAKE 1 TABLET (10 MG TOTAL) BY MOUTH DAILY. 90 tablet 1  . predniSONE (DELTASONE) 10  MG tablet 3 tabs by mouth per day for 3 days,2tabs per day for 3 days,1tab per day for 3 days 18 tablet 0  . rivaroxaban (XARELTO) 20 MG TABS tablet Take 1 tablet (20 mg total) by mouth daily with supper. 30 tablet 5  . tamsulosin (FLOMAX) 0.4 MG CAPS capsule TAKE 1 CAPSULE BY MOUTH TWICE A DAY 180 capsule 2  . telmisartan (MICARDIS) 20 MG tablet TAKE 1 TABLET BY MOUTH EVERY DAY 90 tablet 3  . tiZANidine (ZANAFLEX) 2 MG tablet TAKE 1 TABLET (2 MG TOTAL) BY MOUTH EVERY 6 (SIX) HOURS AS NEEDED FOR MUSCLE SPASMS. 50 tablet 2  . traMADol (ULTRAM) 50 MG tablet TAKE 1 TABLET BY MOUTH EVERY 6 HOURS AS NEEDED FOR PAIN 120 tablet 2  .  triamcinolone (NASACORT) 55 MCG/ACT AERO nasal inhaler Place 2 sprays into the nose daily. 1 Inhaler 12  . Vitamin D, Ergocalciferol, (DRISDOL) 1.25 MG (50000 UNIT) CAPS capsule TAKE 1 CAPSULE (50,000 UNITS TOTAL) BY MOUTH EVERY 7 (SEVEN) DAYS. 12 capsule 0   No current facility-administered medications for this visit.    Physical Exam: Vitals:   03/27/20 1541  BP: 128/88  Pulse: 60  SpO2: 96%  Height: 5\' 9"  (1.753 m)    GEN- The patient is well appearing, alert and oriented x 3 today.   Head- normocephalic, atraumatic Eyes-  Sclera clear, conjunctiva pink Ears- hearing intact Oropharynx- clear Lungs-   normal work of breathing Chest- pacemaker pocket is well healed Heart- Regular rate and rhythm  GI- soft  Extremities- no clubbing, cyanosis, or edema  Pacemaker interrogation- reviewed in detail today,  See PACEART report  ekg tracing ordered today is personally reviewed and shows atrial paced rhythm, first degree AV block, RBBB  Assessment and Plan:  1. Symptomatic sinus bradycardia  Normal pacemaker function See Pace Art report No changes today he is not device dependant today He has chronic RV lead failure.  He is programmed AAIR.  He previously V paced 18%.  He atrial paces 88%.    2. afib Well controlled chads2vasc score is 3 Continue xarelto  3. Hypertensive cardiovascular disease Stable No change required today  4. SOB/ fatigue Will order echo and lexiscan to further evaluate Dr [name] and I discussed cardiac CT as an option.  She advises d spect instead given likelihood of artifact with the CT  Risks, benefits and potential toxicities for medications prescribed and/or refilled reviewed with patient today.   6 months with me  [name] Grayer MD, [facility] 03/27/2020 4:05 PM

## 2020-03-27 NOTE — Patient Instructions (Addendum)
Medication Instructions:  Your physician recommends that you continue on your current medications as directed. Please refer to the Current Medication list given to you today.  *If you need a refill on your cardiac medications before your next appointment, please call your pharmacy*   Lab Work: None ordered If you have labs (blood work) drawn today and your tests are completely normal, you will receive your results only by: Marland Kitchen MyChart Message (if you have MyChart) OR . A paper copy in the mail If you have any lab test that is abnormal or we need to change your treatment, we will call you to review the results.   Testing/Procedures: Your physician has requested that you have an echocardiogram. Echocardiography is a painless test that uses sound waves to create images of your heart. It provides your doctor with information about the size and shape of your heart and how well your heart's chambers and valves are working. This procedure takes approximately one hour. There are no restrictions for this procedure.  Your physician has requested that you have a lexiscan myoview. For further information please visit [email or web address]. Please follow instruction sheet, as given.   Follow-Up: At [facility], you and your health needs are our priority.  As part of our continuing mission to provide you with exceptional heart care, we have created designated Provider Care Teams.  These Care Teams include your primary Cardiologist (physician) and Advanced Practice Providers (APPs -  Physician Assistants and Nurse Practitioners) who all work together to provide you with the care you need, when you need it.  We recommend signing up for the patient portal called "MyChart".  Sign up information is provided on this After Visit Summary.  MyChart is used to connect with patients for Virtual Visits (Telemedicine).  Patients are able to view lab/test results, encounter notes, upcoming appointments, etc.  Non-urgent  messages can be sent to your provider as well.   To learn more about what you can do with MyChart, go to [email or web address].    Your next appointment:   6 month(s)  The format for your next appointment:   In Person  Provider:   Thompson Grayer, MD   Thank you for choosing [facility]!!     Other Instructions

## 2020-04-02 LAB — CUP PACEART INCLINIC DEVICE CHECK
Brady Statistic RA Percent Paced: 88 %
Brady Statistic RV Percent Paced: 0 %
Date Time Interrogation Session: 20210512165742
Implantable Lead Implant Date: 20100719
Implantable Lead Implant Date: 20100719
Implantable Lead Location: 753859
Implantable Lead Location: 753860
Implantable Lead Model: 4136
Implantable Lead Model: 4137
Implantable Lead Serial Number: 28454141
Implantable Lead Serial Number: 28594836
Implantable Pulse Generator Implant Date: 20100719
Lead Channel Impedance Value: 560 Ohm
Lead Channel Pacing Threshold Amplitude: 0.6 V
Lead Channel Pacing Threshold Pulse Width: 0.4 ms
Lead Channel Sensing Intrinsic Amplitude: 2.2 mV
Lead Channel Setting Pacing Amplitude: 2 V
Pulse Gen Serial Number: 764370

## 2020-04-08 ENCOUNTER — Other Ambulatory Visit: Payer: Self-pay | Admitting: Internal Medicine

## 2020-04-14 ENCOUNTER — Other Ambulatory Visit: Payer: Self-pay | Admitting: Internal Medicine

## 2020-04-14 NOTE — Telephone Encounter (Signed)
Please change to OTC Vitamin D3 at 2000 units per day, indefinitely.  

## 2020-04-16 ENCOUNTER — Telehealth (HOSPITAL_COMMUNITY): Payer: Self-pay

## 2020-04-16 NOTE — Telephone Encounter (Signed)
Spoke with the patient's wife. Detailed instructions given, she stated that she understood and would make sure he comes for his test. Asked to call back with any questions. S.Audi Wettstein EMTP

## 2020-04-16 NOTE — Telephone Encounter (Signed)
Pt called & left message with wife Stanton Kidney re: Dr Gwynn Burly request to please change to OTC Vitamin D3 at 2000 units per day, indefinitely. Mary verb understanding.Marland Kitchen

## 2020-04-18 ENCOUNTER — Ambulatory Visit (HOSPITAL_COMMUNITY): Payer: Medicare Other | Attending: Cardiology

## 2020-04-18 ENCOUNTER — Other Ambulatory Visit: Payer: Self-pay

## 2020-04-18 ENCOUNTER — Ambulatory Visit (HOSPITAL_BASED_OUTPATIENT_CLINIC_OR_DEPARTMENT_OTHER): Payer: Medicare Other

## 2020-04-18 DIAGNOSIS — R0602 Shortness of breath: Secondary | ICD-10-CM

## 2020-04-18 LAB — MYOCARDIAL PERFUSION IMAGING
LV dias vol: 91 mL (ref 62–150)
LV sys vol: 41 mL
Peak HR: 60 {beats}/min
Rest HR: 60 {beats}/min
SDS: 2
SRS: 0
SSS: 2
TID: 1.05

## 2020-04-18 LAB — ECHOCARDIOGRAM COMPLETE
Height: 69 in
Weight: 3056 oz

## 2020-04-18 MED ORDER — TECHNETIUM TC 99M TETROFOSMIN IV KIT
32.8000 | PACK | Freq: Once | INTRAVENOUS | Status: AC | PRN
  Administered 2020-04-18: 32.8 via INTRAVENOUS
  Filled 2020-04-18: qty 33

## 2020-04-18 MED ORDER — TECHNETIUM TC 99M TETROFOSMIN IV KIT
10.2000 | PACK | Freq: Once | INTRAVENOUS | Status: AC | PRN
  Administered 2020-04-18: 10.2 via INTRAVENOUS
  Filled 2020-04-18: qty 11

## 2020-04-18 MED ORDER — REGADENOSON 0.4 MG/5ML IV SOLN
0.4000 mg | Freq: Once | INTRAVENOUS | Status: AC
  Administered 2020-04-18: 0.4 mg via INTRAVENOUS

## 2020-04-29 ENCOUNTER — Telehealth: Payer: Self-pay

## 2020-04-29 NOTE — Telephone Encounter (Signed)
error 

## 2020-05-07 ENCOUNTER — Encounter: Payer: Self-pay | Admitting: Internal Medicine

## 2020-05-07 ENCOUNTER — Other Ambulatory Visit: Payer: Self-pay

## 2020-05-07 ENCOUNTER — Ambulatory Visit (INDEPENDENT_AMBULATORY_CARE_PROVIDER_SITE_OTHER): Payer: Medicare Other | Admitting: Internal Medicine

## 2020-05-07 VITALS — BP 130/90 | HR 59 | Temp 97.8°F | Ht 69.0 in | Wt 190.0 lb

## 2020-05-07 DIAGNOSIS — E559 Vitamin D deficiency, unspecified: Secondary | ICD-10-CM

## 2020-05-07 DIAGNOSIS — Z1159 Encounter for screening for other viral diseases: Secondary | ICD-10-CM | POA: Diagnosis not present

## 2020-05-07 DIAGNOSIS — N318 Other neuromuscular dysfunction of bladder: Secondary | ICD-10-CM

## 2020-05-07 DIAGNOSIS — R739 Hyperglycemia, unspecified: Secondary | ICD-10-CM

## 2020-05-07 DIAGNOSIS — J986 Disorders of diaphragm: Secondary | ICD-10-CM

## 2020-05-07 DIAGNOSIS — R062 Wheezing: Secondary | ICD-10-CM | POA: Diagnosis not present

## 2020-05-07 DIAGNOSIS — E785 Hyperlipidemia, unspecified: Secondary | ICD-10-CM

## 2020-05-07 MED ORDER — ALBUTEROL SULFATE HFA 108 (90 BASE) MCG/ACT IN AERS: 2.0000 | INHALATION_SPRAY | Freq: Four times a day (QID) | RESPIRATORY_TRACT | 6 refills | Status: DC | PRN

## 2020-05-07 NOTE — Assessment & Plan Note (Signed)
D/w pt, ,to continue oral replacement

## 2020-05-07 NOTE — Assessment & Plan Note (Signed)
stable overall by history and exam, recent data reviewed with pt, and pt to continue medical treatment as before,  to f/u any worsening symptoms or concerns  

## 2020-05-07 NOTE — Assessment & Plan Note (Addendum)
?   Asthma - allergy related new onset - for albuterol HFA prn, recent cxr NAD reveiwed with pt from feb 2021, declines cxr or lab repeat, ok for pulm referral per pt request  I spent 31 minutes in preparing to see the patient by review of recent labs, imaging and procedures, obtaining and reviewing separately obtained history, communicating with the patient and family or caregiver, ordering medications, tests or procedures, and documenting clinical information in the EHR including the differential Dx, treatment, and any further evaluation and other management of wheezing, vit d def, OAB, hld, hyperglycemia, left paralysis diaphragm

## 2020-05-07 NOTE — Assessment & Plan Note (Signed)
Chronic, d/w pt not likely to be major element of his current dyspnea

## 2020-05-07 NOTE — Patient Instructions (Signed)
Please take all new medication as prescribed - the albuterol inhaler as needed  Please continue all other medications as before, and refills have been done if requested.  Please have the pharmacy call with any other refills you may need.  Please continue your efforts at being more active, low cholesterol diet, and weight control.  You are otherwise up to date with prevention measures today.  Please keep your appointments with your specialists as you may have planned  You will be contacted regarding the referral for: pulmonary  Please make an Appointment to return in 6 months, or sooner if needed

## 2020-05-07 NOTE — Assessment & Plan Note (Signed)
Pt agrees to cont current tx and f/u urology as planned

## 2020-05-07 NOTE — Progress Notes (Signed)
Subjective:    Patient ID: [name], male    DOB: October 03, 1941, 79 y.o.   MRN: [id number]  HPI Here to f/u; overall doing ok,  Pt denies chest pain, increasing sob or doe, orthopnea, PND, increased LE swelling, palpitations, dizziness or syncope., but has had several wks intermittent mild worsening wheezing sob/doe, despite good med compliance.  Symptoms worse at nght, better in the AM.  Has hx of allergies  Pt denies new neurological symptoms such as new headache, or facial or extremity weakness or numbness.  Pt denies polydipsia, polyuria, or low sugar episode.  Pt states overall good compliance with meds, mostly trying to follow appropriate diet, with wt overall stable,  Denies urinary symptoms such as dysuria, frequency, urgency, flank pain, hematuria or n/v, fever, chills, though he is not sure the flomax and myrbetrix are really working for him Past Medical History:  Diagnosis Date  . Abdominal pain, epigastric   . Allergic rhinitis, cause unspecified   . Arthritis   . BPH (benign prostatic hyperplasia)   . CHF (congestive heart failure) ([place])   . Chronic anticoagulation    xarelto  . Coronary artery calcification seen on CAT scan 12/26/2012  . Depressive disorder, not elsewhere classified   . Diverticulosis of colon (without mention of hemorrhage)   . Esophageal reflux   . High risk medication use   . Hyperlipidemia   . Hyperplastic rectal polyp   . Hypertension   . Lumbago   . Pacemaker   . Paralyzed hemidiaphragm    LEFT  . Persistent atrial fibrillation ([place])   . Sick sinus syndrome [facility]) July 2010   s/p PPM [facility]) by [name]   Past Surgical History:  Procedure Laterality Date  . ESOPHAGOGASTRODUODENOSCOPY N/A 12/05/2013   Procedure: ESOPHAGOGASTRODUODENOSCOPY (EGD);  Surgeon: [name], MD;  Location: [facility];  Service: Endoscopy;  Laterality: N/A;  . ESOPHAGOGASTRODUODENOSCOPY N/A 07/04/2015   Procedure: ESOPHAGOGASTRODUODENOSCOPY (EGD);  Surgeon: [name], MD;  Location: [place];  Service: Endoscopy;  Laterality: N/A;  . INSERT / REPLACE / REMOVE PACEMAKER  05/2009    [employer] ALTRUA 60  (serial number [id number]) dual-chamber pacemaker  . PACEMAKER PLACEMENT Bilateral    '05    reports that he quit smoking about 41 years ago. His smoking use included cigarettes. He has a 12.50 pack-year smoking history. He has quit using smokeless tobacco.  His smokeless tobacco use included chew. He reports that he does not drink alcohol and does not use drugs. family history includes Arthritis in his paternal grandmother; Asthma in his sister; Cancer in his father; Diabetes in his mother; Hypertension in his brother, daughter, father, mother, sister, and son; Lung cancer in his father. No Known Allergies Current Outpatient Medications on File Prior to Visit  Medication Sig Dispense Refill  . acetaminophen (TYLENOL ARTHRITIS PAIN) 650 MG CR tablet Take 650 mg by mouth every 8 (eight) hours as needed for pain.    Marland Kitchen amLODipine (NORVASC) 5 MG tablet TAKE 1 TABLET BY MOUTH EVERY DAY 90 tablet 1  . atorvastatin (LIPITOR) 80 MG tablet TAKE 1 TABLET BY MOUTH EVERY DAY 90 tablet 3  . clobetasol cream (TEMOVATE) 9.56 % Apply 1 application topically 2 (two) times daily. 30 g 2  . diclofenac sodium (VOLTAREN) 1 % GEL APPLY 4 GRAMS TOPICALLY 4 TIMES A DAY AS NEEDED 400 g 1  . dorzolamide-timolol (COSOPT) 22.3-6.8 MG/ML ophthalmic solution Place 2 drops into both eyes in the morning and  at bedtime.    . fexofenadine (ALLEGRA) 180 MG tablet TAKE 1 TABLET (180 MG TOTAL) BY MOUTH DAILY. 90 tablet 1  . flecainide (TAMBOCOR) 100 MG tablet TAKE 1 TABLET BY MOUTH TWICE A DAY 180 tablet 3  . latanoprost (XALATAN) 0.005 % ophthalmic solution Place 1 drop into both eyes at bedtime.     . meclizine (ANTIVERT) 12.5 MG tablet TAKE 1 TABLET (12.5 MG TOTAL) BY MOUTH 3 (THREE) TIMES DAILY AS NEEDED FOR DIZZINESS. 30 tablet 2  . meloxicam (MOBIC) 7.5 MG tablet TAKE 1  TABLET BY MOUTH EVERY DAY AS NEEDED FOR PAIN 30 tablet 5  . omeprazole (PRILOSEC) 40 MG capsule TAKE 1 CAPSULE BY MOUTH EVERY DAY 90 capsule 1  . oxybutynin (DITROPAN-XL) 10 MG 24 hr tablet TAKE 1 TABLET (10 MG TOTAL) BY MOUTH DAILY. 90 tablet 1  . predniSONE (DELTASONE) 10 MG tablet 3 tabs by mouth per day for 3 days,2tabs per day for 3 days,1tab per day for 3 days 18 tablet 0  . rivaroxaban (XARELTO) 20 MG TABS tablet Take 1 tablet (20 mg total) by mouth daily with supper. 30 tablet 5  . tamsulosin (FLOMAX) 0.4 MG CAPS capsule TAKE 1 CAPSULE BY MOUTH TWICE A DAY 180 capsule 2  . telmisartan (MICARDIS) 20 MG tablet TAKE 1 TABLET BY MOUTH EVERY DAY 90 tablet 3  . traMADol (ULTRAM) 50 MG tablet TAKE 1 TABLET BY MOUTH EVERY 6 HOURS AS NEEDED FOR PAIN 120 tablet 2   No current facility-administered medications on file prior to visit.   Review of Systems All otherwise neg per pt;    Objective:   Physical Exam. BP 130/90 (BP Location: Left Arm, Patient Position: Sitting, Cuff Size: Large)   Pulse (!) 59   Temp 97.8 F (36.6 C) (Oral)   Ht 5\' 9"  (1.753 m)   Wt 190 lb (86.2 kg)   SpO2 97%   BMI 28.06 kg/m  VS noted,  Constitutional: Pt appears in NAD HENT: Head: NCAT.  Right Ear: External ear normal.  Left Ear: External ear normal.  Eyes: . Pupils are equal, round, and reactive to light. Conjunctivae and EOM are normal Nose: without d/c or deformity Neck: Neck supple. Gross normal ROM Cardiovascular: Normal rate and regular rhythm.   Pulmonary/Chest: Effort normal and breath sounds without rales or wheezing.  Abd:  Soft, NT, ND, + BS, no organomegaly Neurological: Pt is alert. At baseline orientation, motor grossly intact Skin: Skin is warm. No rashes, other new lesions, no LE edema Psychiatric: Pt behavior is normal without agitation  All otherwise neg per pt Lab Results  Component Value Date   WBC 7.0 01/13/2020   HGB 16.0 01/13/2020   HCT 48.8 01/13/2020   PLT 182 01/13/2020     GLUCOSE 120 (H) 01/13/2020   CHOL 185 11/07/2019   TRIG 59.0 11/07/2019   HDL 75.00 11/07/2019   LDLDIRECT 183.4 12/26/2013   LDLCALC 98 11/07/2019   ALT 41 01/13/2020   AST 29 01/13/2020   NA 140 01/13/2020   K 3.8 01/13/2020   CL 106 01/13/2020   CREATININE 0.91 01/13/2020   BUN 16 01/13/2020   CO2 23 01/13/2020   TSH 1.36 11/07/2019   PSA 1.76 11/07/2019   INR 1.45 07/01/2015   HGBA1C 6.2 11/07/2019        Assessment & Plan:

## 2020-05-23 ENCOUNTER — Other Ambulatory Visit: Payer: Self-pay | Admitting: Internal Medicine

## 2020-05-28 ENCOUNTER — Other Ambulatory Visit: Payer: Self-pay

## 2020-05-28 ENCOUNTER — Ambulatory Visit (INDEPENDENT_AMBULATORY_CARE_PROVIDER_SITE_OTHER): Payer: Medicare Other

## 2020-05-28 ENCOUNTER — Encounter: Payer: Self-pay | Admitting: Internal Medicine

## 2020-05-28 ENCOUNTER — Ambulatory Visit (INDEPENDENT_AMBULATORY_CARE_PROVIDER_SITE_OTHER): Payer: Medicare Other | Admitting: Emergency Medicine

## 2020-05-28 ENCOUNTER — Ambulatory Visit (INDEPENDENT_AMBULATORY_CARE_PROVIDER_SITE_OTHER): Payer: Medicare Other | Admitting: Internal Medicine

## 2020-05-28 DIAGNOSIS — I495 Sick sinus syndrome: Secondary | ICD-10-CM

## 2020-05-28 DIAGNOSIS — R0600 Dyspnea, unspecified: Secondary | ICD-10-CM

## 2020-05-28 DIAGNOSIS — J986 Disorders of diaphragm: Secondary | ICD-10-CM | POA: Diagnosis not present

## 2020-05-28 DIAGNOSIS — Z1159 Encounter for screening for other viral diseases: Secondary | ICD-10-CM | POA: Diagnosis not present

## 2020-05-28 DIAGNOSIS — R0609 Other forms of dyspnea: Secondary | ICD-10-CM

## 2020-05-28 LAB — CUP PACEART INCLINIC DEVICE CHECK
Brady Statistic RA Percent Paced: 89 %
Brady Statistic RV Percent Paced: 0 %
Date Time Interrogation Session: 20210713104703
Implantable Lead Implant Date: 20100719
Implantable Lead Implant Date: 20100719
Implantable Lead Location: 753859
Implantable Lead Location: 753860
Implantable Lead Model: 4136
Implantable Lead Model: 4137
Implantable Lead Serial Number: 28454141
Implantable Lead Serial Number: 28594836
Implantable Pulse Generator Implant Date: 20100719
Lead Channel Pacing Threshold Amplitude: 0.7 V
Lead Channel Pacing Threshold Pulse Width: 0.4 ms
Lead Channel Sensing Intrinsic Amplitude: 1.1 mV
Pulse Gen Serial Number: 764370

## 2020-05-28 NOTE — Patient Instructions (Signed)
To get the most out of exercise, you need to be continuously aware that you are short of breath, but never out of breath, for 30 minutes daily. As you improve, it will actually be easier for you to do the same amount of exercise  in  30 minutes so always push to the level where you are short of breath.     Please remember to go to the lab and x-ray department   for your tests - we will call you with the results when they are available.     Please schedule a follow up office visit in 6-8  weeks, call sooner if needed with PFTs

## 2020-05-28 NOTE — Progress Notes (Signed)
[name], male    DOB: 01/11/1941    MRN: [id number]   Brief patient profile:  79 yobm quit smoking in 1993 s sequelae and eval by [facility] 2015 for doe felt to be due to paralyzed L HD with restrictive changes and some worse since then esp x 02/2020 so referred to pulmonary clinic 05/28/2020 by Dr   Jenny Reichmann for reeval      History of Present Illness  05/28/2020  Pulmonary/ 1st office eval/Jude Linck s/p pfizer 01/08/20 then moderna  05/07/20   Chief Complaint  Patient presents with  . Pulmonary Consult    Referred by Dr. Cathlean Cower. Pt seen here by Dr Lamonte Sakai last in 2015- he c/o worsening SOB over the past 2-3 months, tires easily.   Dyspnea:  Slowing down seems to help sob,  Goal is to  be able to push a mower for  more than 50 yards without stopping  MMRC3 = can't walk 100 yards even at a slow pace at a flat grade s stopping due to sob. Cough: none Sleep: able to lie flat/ one pillow/ arthritis disturbance  SABA use: no better   No obvious day to day or daytime variability or assoc excess/ purulent sputum or mucus plugs or hemoptysis or cp or chest tightness, subjective wheeze or overt sinus or hb symptoms.   Sleeping  without nocturnal  or early am exacerbation  of respiratory  c/o's or need for noct saba. Also denies any obvious fluctuation of symptoms with weather or environmental changes or other aggravating or alleviating factors except as outlined above   No unusual exposure hx or h/o childhood pna/ asthma or knowledge of premature birth.  Current Allergies, Complete Past Medical History, Past Surgical History, Family History, and Social History were reviewed in [employer] record.  ROS  The following are not active complaints unless bolded Hoarseness, sore throat, dysphagia, dental problems, itching, sneezing,  nasal congestion or discharge of excess mucus or purulent secretions, ear ache,   fever, chills, sweats, unintended wt loss or wt gain, classically pleuritic  or exertional cp,  orthopnea pnd or arm/hand swelling  or leg swelling, presyncope, palpitations, abdominal pain, anorexia, nausea, vomiting, diarrhea  or change in bowel habits or change in bladder habits, change in stools or change in urine, dysuria, hematuria,  rash, arthralgias, visual complaints, headache, numbness, weakness or ataxia or problems with walking or coordination,  change in mood or  memory.           Past Medical History:  Diagnosis Date  . Abdominal pain, epigastric   . Allergic rhinitis, cause unspecified   . Arthritis   . BPH (benign prostatic hyperplasia)   . CHF (congestive heart failure) ([place])   . Chronic anticoagulation    xarelto  . Coronary artery calcification seen on CAT scan 12/26/2012  . Depressive disorder, not elsewhere classified   . Diverticulosis of colon (without mention of hemorrhage)   . Esophageal reflux   . High risk medication use   . Hyperlipidemia   . Hyperplastic rectal polyp   . Hypertension   . Lumbago   . Pacemaker   . Paralyzed hemidiaphragm    LEFT  . Persistent atrial fibrillation ([place])   . Sick sinus syndrome [facility]) July 2010   s/p PPM [facility]) by [facility]    Outpatient Medications Prior to Visit  Medication Sig Dispense Refill  . acetaminophen (TYLENOL ARTHRITIS PAIN) 650 MG CR tablet Take 650 mg by mouth every  8 (eight) hours as needed for pain.    Marland Kitchen albuterol (VENTOLIN HFA) 108 (90 Base) MCG/ACT inhaler Inhale 2 puffs into the lungs every 6 (six) hours as needed for wheezing or shortness of breath. 54 g 6  . amLODipine (NORVASC) 5 MG tablet TAKE 1 TABLET BY MOUTH EVERY DAY 90 tablet 1  . atorvastatin (LIPITOR) 80 MG tablet TAKE 1 TABLET BY MOUTH EVERY DAY 90 tablet 3  . clobetasol cream (TEMOVATE) 7.12 % Apply 1 application topically 2 (two) times daily. 30 g 2  . diclofenac sodium (VOLTAREN) 1 % GEL APPLY 4 GRAMS TOPICALLY 4 TIMES A DAY AS NEEDED 400 g 1  . dorzolamide-timolol (COSOPT) 22.3-6.8 MG/ML ophthalmic solution  Place 2 drops into both eyes in the morning and at bedtime.    . fexofenadine (ALLEGRA) 180 MG tablet TAKE 1 TABLET (180 MG TOTAL) BY MOUTH DAILY. 90 tablet 1  . flecainide (TAMBOCOR) 100 MG tablet TAKE 1 TABLET BY MOUTH TWICE A DAY 180 tablet 3  . latanoprost (XALATAN) 0.005 % ophthalmic solution Place 1 drop into both eyes at bedtime.     . meclizine (ANTIVERT) 12.5 MG tablet TAKE 1 TABLET (12.5 MG TOTAL) BY MOUTH 3 (THREE) TIMES DAILY AS NEEDED FOR DIZZINESS. 30 tablet 2  . meloxicam (MOBIC) 7.5 MG tablet TAKE 1 TABLET BY MOUTH EVERY DAY AS NEEDED FOR PAIN 30 tablet 5  . omeprazole (PRILOSEC) 40 MG capsule TAKE 1 CAPSULE BY MOUTH EVERY DAY 90 capsule 1  . oxybutynin (DITROPAN-XL) 10 MG 24 hr tablet TAKE 1 TABLET (10 MG TOTAL) BY MOUTH DAILY. 90 tablet 1  . rivaroxaban (XARELTO) 20 MG TABS tablet Take 1 tablet (20 mg total) by mouth daily with supper. 30 tablet 5  . tamsulosin (FLOMAX) 0.4 MG CAPS capsule TAKE 1 CAPSULE BY MOUTH TWICE A DAY 180 capsule 2  . telmisartan (MICARDIS) 20 MG tablet TAKE 1 TABLET BY MOUTH EVERY DAY 90 tablet 3  . traMADol (ULTRAM) 50 MG tablet TAKE 1 TABLET BY MOUTH EVERY 6 HOURS AS NEEDED FOR PAIN 120 tablet 2  . predniSONE (DELTASONE) 10 MG tablet Finished   0      Objective:     BP 120/60 (BP Location: Left Arm, Cuff Size: Normal)   Pulse 60   Temp 97.8 F (36.6 C) (Oral)   Ht 5\' 9"  (1.753 m)   Wt 191 lb 9.6 oz (86.9 kg)   SpO2 100% Comment: on RA  BMI 28.29 kg/m   SpO2: 100 % (on RA)   Wt Readings from Last 3 Encounters:  05/28/20 191 lb 9.6 oz (86.9 kg)  05/07/20 190 lb (86.2 kg)  04/18/20 191 lb (86.6 kg)      amb bm nad    HEENT : pt wearing mask not removed for exam due to covid -19 concerns.    NECK :  without JVD/Nodes/TM/ nl carotid upstrokes bilaterally   LUNGS: no acc muscle use,  Nl contour chest with decreased bs L base  without cough on insp or exp maneuvers   CV:  RRR  no s3 or murmur or increase in P2, and no edema    ABD:  soft and nontender with nl inspiratory excursion in the supine position. No bruits or organomegaly appreciated, bowel sounds nl  MS:  Nl gait/ ext warm without deformities, calf tenderness, cyanosis or clubbing No obvious joint restrictions   SKIN: warm and dry without lesions    NEURO:  alert, approp, nl sensorium with  no motor or cerebellar deficits apparent.    CXR PA and Lateral:   05/28/2020 :    I personally reviewed images and  impression as follows:   No change elevated L HD - much more air though in LUQ stomach/ bowel than priors  Labs ordered/ reviewed:      Chemistry      Component Value Date/Time   NA 142 05/28/2020 1529   K 3.9 05/28/2020 1529   CL 108 05/28/2020 1529   CO2 28 05/28/2020 1529   BUN 14 05/28/2020 1529   CREATININE 0.82 05/28/2020 1529   CREATININE 1.03 12/11/2015 0912      Component Value Date/Time   CALCIUM 9.4 05/28/2020 1529   ALKPHOS 63 01/13/2020 1708   AST 29 01/13/2020 1708   ALT 41 01/13/2020 1708   BILITOT 0.9 01/13/2020 1708        Lab Results  Component Value Date   WBC 5.9 05/28/2020   HGB 13.9 05/28/2020   HCT 42.2 05/28/2020   MCV 93.3 05/28/2020   PLT 152.0 05/28/2020       EOS                                                               0.1                                    05/28/2020        Lab Results  Component Value Date   TSH 0.86 05/28/2020     Lab Results  Component Value Date   PROBNP 35.0 05/28/2020               Assessment   DOE (dyspnea on exertion) 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 only - worse doe since spirng 2021  -  05/28/2020   Walked RA  2 laps @ approx 290ft each @ fast  pace  stopped due to end of study, no desats, no sob  - sats 97%   Symptoms are  disproportionate to objective findings and not clear to what extent this is actually a pulmonary  problem but pt does appear to have difficult to sort out respiratory  symptoms of unknown origin for which  DDX  = almost all start with A and  include Adherence, Ace Inhibitors, Acid Reflux, Active Sinus Disease, Alpha 1 Antitripsin deficiency, Anxiety masquerading as Airways dz,  ABPA,  Allergy(esp in young), Aspiration (esp in elderly), Adverse effects of meds,  Active smoking or Vaping, A bunch of PE's/clot burden (a few small clots can't cause this syndrome unless there is already severe underlying pulm or vascular dz with poor reserve),  Anemia or thyroid disorder, plus two Bs  = Bronchiectasis and Beta blocker use..and one C= CHF   Adherence is always the initial "prime suspect" and is a multilayered concern that requires a "trust but verify" approach in every patient - starting with knowing how to use medications, especially inhalers, correctly, keeping up with refills and understanding the fundamental difference between maintenance and prns vs those medications only taken for a very short course and then stopped and not refilled.     ?  Allergy/asthma/ copd  When respiratory symptoms begin or become refractory well after a patient reports complete smoking cessation,  Especially when this wasn't the case while they were smoking, a red flag is raised based on the work of Dr Kris Mouton which states:  if you quit smoking when your best day FEV1 is still well preserved it is highly unlikely you will progress to severe disease.  That is to say, once the smoking stops,  the symptoms should not suddenly erupt or markedly worsen.  If so, the differential diagnosis should include  obesity/deconditioning,  LPR/Reflux/Aspiration syndromes,  occult CHF, or  especially side effect of medications commonly used in this population >>> strongly doubt airways dz here > repeat pfts   ? Adverse drug effects > none of the usual suspects listed   ?  A  Bunch of PEs > unlikely while on DOAC  Anemia / thyroid dz >  Excluded today  ? CHF > excluded today   Discussed specifics of  reconditioning with submax ex up to 30 min daily and plans to f/u with pfts in 6-8 weeks but no medication changes needed for now         Each maintenance medication was reviewed in detail including emphasizing most importantly the difference between maintenance and prns and under what circumstances the prns are to be triggered using an action plan format where appropriate.  Total time for H and P, chart review, counseling, teaching about inhalers and  directly observing portions of ambulatory 02 saturation study/  device and generating customized AVS unique to this office visit / charting = 40 min                Christinia Gully, MD 05/28/2020

## 2020-05-28 NOTE — Progress Notes (Signed)
Pacemaker check in clinic. Normal device function. Thresholds, sensing, impedances consistent with previous measurements. Device programmed to maximize longevity. No mode switches. Programmed AAIR, known RV lead issues. No output capture in uni polar or bipolar.  Device programmed at appropriate safety margins. Histogram distribution appropriate for patient. Minute ventilation programmed on, auto 3 response factor.  Estimated longevity <0.5 years. Patient scheduled for in-clinic device check 08/27/20. Patient education completed. ROV w/ Dr. Rayann Heman 10/07/2020. See scanned report.

## 2020-05-29 ENCOUNTER — Encounter: Payer: Self-pay | Admitting: Internal Medicine

## 2020-05-29 LAB — BASIC METABOLIC PANEL
BUN: 14 mg/dL (ref 6–23)
CO2: 28 mEq/L (ref 19–32)
Calcium: 9.4 mg/dL (ref 8.4–10.5)
Chloride: 108 mEq/L (ref 96–112)
Creatinine, Ser: 0.82 mg/dL (ref 0.40–1.50)
GFR: 110.2 mL/min (ref >60.00)
Glucose, Bld: 98 mg/dL (ref 70–99)
Potassium: 3.9 mEq/L (ref 3.5–5.1)
Sodium: 142 mEq/L (ref 135–145)

## 2020-05-29 LAB — CBC WITH DIFFERENTIAL/PLATELET
Basophils Absolute: 0.1 10*3/uL (ref 0.0–0.1)
Basophils Relative: 1.2 % (ref 0.0–3.0)
Eosinophils Absolute: 0.1 10*3/uL (ref 0.0–0.7)
Eosinophils Relative: 1.8 % (ref 0.0–5.0)
HCT: 42.2 % (ref 39.0–52.0)
Hemoglobin: 13.9 g/dL (ref 13.0–17.0)
Lymphocytes Relative: 31.1 % (ref 12.0–46.0)
Lymphs Abs: 1.8 10*3/uL (ref 0.7–4.0)
MCHC: 33 g/dL (ref 30.0–36.0)
MCV: 93.3 fl (ref 78.0–100.0)
Monocytes Absolute: 0.8 10*3/uL (ref 0.1–1.0)
Monocytes Relative: 13.5 % — ABNORMAL HIGH (ref 3.0–12.0)
Neutro Abs: 3.1 10*3/uL (ref 1.4–7.7)
Neutrophils Relative %: 52.4 % (ref 43.0–77.0)
Platelets: 152 10*3/uL (ref 150.0–400.0)
RBC: 4.52 Mil/uL (ref 4.22–5.81)
RDW: 13.8 % (ref 11.5–15.5)
WBC: 5.9 10*3/uL (ref 4.0–10.5)

## 2020-05-29 LAB — HEPATITIS C ANTIBODY
Hepatitis C Ab: NONREACTIVE
SIGNAL TO CUT-OFF: 0.02 (ref <1.00)

## 2020-05-29 LAB — TSH: TSH: 0.86 u[IU]/mL (ref 0.35–4.50)

## 2020-05-29 LAB — BRAIN NATRIURETIC PEPTIDE: Pro B Natriuretic peptide (BNP): 35 pg/mL (ref 0.0–100.0)

## 2020-05-29 NOTE — Assessment & Plan Note (Addendum)
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 only - worse doe since spirng 2021  -  05/28/2020   Walked RA  2 laps @ approx 271ft each @ fast  pace  stopped due to end of study, no desats, no sob  - sats 97%   Symptoms are  disproportionate to objective findings and not clear to what extent this is actually a pulmonary  problem but pt does appear to have difficult to sort out respiratory symptoms of unknown origin for which  DDX  = almost all start with A and  include Adherence, Ace Inhibitors, Acid Reflux, Active Sinus Disease, Alpha 1 Antitripsin deficiency, Anxiety masquerading as Airways dz,  ABPA,  Allergy(esp in young), Aspiration (esp in elderly), Adverse effects of meds,  Active smoking or Vaping, A bunch of PE's/clot burden (a few small clots can't cause this syndrome unless there is already severe underlying pulm or vascular dz with poor reserve),  Anemia or thyroid disorder, plus two Bs  = Bronchiectasis and Beta blocker use..and one C= CHF   Adherence is always the initial "prime suspect" and is a multilayered concern that requires a "trust but verify" approach in every patient - starting with knowing how to use medications, especially inhalers, correctly, keeping up with refills and understanding the fundamental difference between maintenance and prns vs those medications only taken for a very short course and then stopped and not refilled.     ? Allergy/asthma/ copd  When respiratory symptoms begin or become refractory well after a patient reports complete smoking cessation,  Especially when this wasn't the case while they were smoking, a red flag is raised based on the work of Dr Kris Mouton which states:  if you quit smoking when your best day FEV1 is still well preserved it is highly unlikely you will progress to severe disease.  That is to say, once the smoking stops,  the symptoms should not suddenly erupt or markedly worsen.  If so, the  differential diagnosis should include  obesity/deconditioning,  LPR/Reflux/Aspiration syndromes,  occult CHF, or  especially side effect of medications commonly used in this population >>> strongly doubt airways dz here > repeat pfts   ? Adverse drug effects > none of the usual suspects listed   ?  A  Bunch of PEs > unlikely while on DOAC  Anemia / thyroid dz >  Excluded today  ? CHF > excluded today   Discussed specifics of reconditioning with submax ex up to 30 min daily and plans to f/u with pfts in 6-8 weeks but no medication changes needed for now         Each maintenance medication was reviewed in detail including emphasizing most importantly the difference between maintenance and prns and under what circumstances the prns are to be triggered using an action plan format where appropriate.  Total time for H and P, chart review, counseling, teaching about inhalers and  directly observing portions of ambulatory 02 saturation study/  device and generating customized AVS unique to this office visit / charting = 40 min

## 2020-05-30 ENCOUNTER — Other Ambulatory Visit: Payer: Self-pay | Admitting: Internal Medicine

## 2020-05-31 ENCOUNTER — Telehealth: Payer: Self-pay | Admitting: Internal Medicine

## 2020-05-31 NOTE — Telephone Encounter (Signed)
Tanda Rockers, MD  05/29/2020 1:01 PM EDT Back to Top    Call patient : Studies are unremarkable, no change in recs   Called and spoke with pt letting him know the results of labwork and he verbalized understanding. Nothing further needed.

## 2020-06-23 ENCOUNTER — Other Ambulatory Visit: Payer: Self-pay | Admitting: Internal Medicine

## 2020-06-24 ENCOUNTER — Institutional Professional Consult (permissible substitution): Payer: Medicare Other | Admitting: Internal Medicine

## 2020-07-03 ENCOUNTER — Other Ambulatory Visit: Payer: Self-pay

## 2020-07-03 ENCOUNTER — Other Ambulatory Visit: Payer: Medicare Other

## 2020-07-03 DIAGNOSIS — Z20822 Contact with and (suspected) exposure to covid-19: Secondary | ICD-10-CM | POA: Diagnosis not present

## 2020-07-05 LAB — NOVEL CORONAVIRUS, NAA: SARS-CoV-2, NAA: NOT DETECTED

## 2020-07-05 LAB — SARS-COV-2, NAA 2 DAY TAT

## 2020-07-06 ENCOUNTER — Other Ambulatory Visit: Payer: Self-pay | Admitting: Internal Medicine

## 2020-07-19 ENCOUNTER — Other Ambulatory Visit: Payer: Self-pay

## 2020-07-19 ENCOUNTER — Encounter: Payer: Self-pay | Admitting: Internal Medicine

## 2020-07-19 ENCOUNTER — Ambulatory Visit (INDEPENDENT_AMBULATORY_CARE_PROVIDER_SITE_OTHER): Payer: Medicare Other | Admitting: Internal Medicine

## 2020-07-19 DIAGNOSIS — R0609 Other forms of dyspnea: Secondary | ICD-10-CM

## 2020-07-19 DIAGNOSIS — R0600 Dyspnea, unspecified: Secondary | ICD-10-CM | POA: Diagnosis not present

## 2020-07-19 DIAGNOSIS — J449 Chronic obstructive pulmonary disease, unspecified: Secondary | ICD-10-CM | POA: Diagnosis not present

## 2020-07-19 LAB — PULMONARY FUNCTION TEST
DL/VA % pred: 100 %
DL/VA: 4 ml/min/mmHg/L
DLCO cor % pred: 51 %
DLCO cor: 11.96 ml/min/mmHg
DLCO unc % pred: 51 %
DLCO unc: 11.96 ml/min/mmHg
FEF 25-75 Post: 2.55 L/sec
FEF 25-75 Pre: 0.7 L/sec
FEF2575-%Change-Post: 266 %
FEF2575-%Pred-Post: 127 %
FEF2575-%Pred-Pre: 34 %
FEV1-%Change-Post: 46 %
FEV1-%Pred-Post: 61 %
FEV1-%Pred-Pre: 42 %
FEV1-Post: 1.54 L
FEV1-Pre: 1.05 L
FEV1FVC-%Change-Post: 6 %
FEV1FVC-%Pred-Pre: 95 %
FEV6-%Change-Post: 38 %
FEV6-%Pred-Post: 63 %
FEV6-%Pred-Pre: 45 %
FEV6-Post: 2.03 L
FEV6-Pre: 1.47 L
FEV6FVC-%Pred-Post: 105 %
FEV6FVC-%Pred-Pre: 105 %
FVC-%Change-Post: 38 %
FVC-%Pred-Post: 59 %
FVC-%Pred-Pre: 43 %
FVC-Post: 2.03 L
FVC-Pre: 1.47 L
Post FEV1/FVC ratio: 76 %
Post FEV6/FVC ratio: 100 %
Pre FEV1/FVC ratio: 72 %
Pre FEV6/FVC Ratio: 100 %
RV % pred: 140 %
RV: 3.48 L
TLC % pred: 78 %
TLC: 5.24 L

## 2020-07-19 MED ORDER — BREO ELLIPTA 100-25 MCG/INH IN AEPB: 1.0000 | INHALATION_SPRAY | Freq: Every day | RESPIRATORY_TRACT | 0 refills | Status: DC

## 2020-07-19 MED ORDER — BREO ELLIPTA 100-25 MCG/INH IN AEPB: 1.0000 | INHALATION_SPRAY | Freq: Every morning | RESPIRATORY_TRACT | 11 refills | Status: DC

## 2020-07-19 NOTE — Assessment & Plan Note (Signed)
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  - PFT's  07/19/2020  FEV1 1.54 (61 % ) ratio 0.76  p 46 % improvement from saba p 0 prior to study with DLCO  11.96(51%) corrects to  11.96(51%)  for alv volume and FV curve classic curvature  - 07/19/2020  After extensive coaching inhaler device,  effectiveness =    90% with elipta > try breo 200 sample then 100 daily maint   He clearly has two different problems - first not solvable and 2nd = copd/Asthma should respond to maint rx with breo 100 and if not try trelegy   Key though is he needs to be consistently more active to note the benefit of any maint bronchodilator         Each maintenance medication was reviewed in detail including emphasizing most importantly the difference between maintenance and prns and under what circumstances the prns are to be triggered using an action plan format where appropriate.  Total time for H and P, chart review, counseling, teaching device and generating customized AVS unique to this office visit / charting = 35 min

## 2020-07-19 NOTE — Progress Notes (Signed)
Full PFT performed today. °

## 2020-07-19 NOTE — Progress Notes (Signed)
[name], male    DOB: Jun 13, 1941    MRN: [id number]   Brief patient profile:  79 yobm quit smoking in 1993 s sequelae and eval by [facility] 2015 for doe felt to be due to paralyzed L HD with restrictive changes and some worse since then esp x 02/2020 so referred to pulmonary clinic 05/28/2020 by Dr   Jenny Reichmann for reeval      History of Present Illness  05/28/2020  Pulmonary/ 1st office eval/[name] s/p pfizer 01/08/20 then moderna  05/07/20   Chief Complaint  Patient presents with  . Pulmonary Consult    Referred by Dr. Cathlean Cower. Pt seen here by Dr Lamonte Sakai last in 2015- he c/o worsening SOB over the past 2-3 months, tires easily.   Dyspnea:  Slowing down seems to help sob,  Goal is to  be able to push a mower for  more than 50 yards without stopping  MMRC3 = can't walk 100 yards even at a slow pace at a flat grade s stopping due to sob. Cough: none Sleep: able to lie flat/ one pillow/ arthritis disturbance  SABA use: no better  rec To get the most out of exercise, you need to be continuously aware that you are short of breath, but never out of breath, for 30 minutes daily. As you improve, it will actually be easier for you to do the same amount of exercise  in  30 minutes so always push to the level where you are short of breath.        07/19/2020  f/u ov/[name] re:  Paralyzed L HD since pacemaker / very sedentary c/o doe Chief Complaint  Patient presents with  . Follow-up    pt is here to go over pft  Dyspnea: MMRC2 = can't walk a nl pace on a flat grade s sob but does fine slow and flat but really not very active at all and has trouble articulating any regular activity where sob. Now says not interested in pushing mower, just rides one  Cough: none  Sleeping: able to lie on flat bed/on side one pillow SABA use: none  02: none    No obvious day to day or daytime variability or assoc excess/ purulent sputum or mucus plugs or hemoptysis or cp or chest tightness, subjective wheeze or overt sinus  or hb symptoms.   Sleeping as above  without nocturnal  or early am exacerbation  of respiratory  c/o's or need for noct saba. Also denies any obvious fluctuation of symptoms with weather or environmental changes or other aggravating or alleviating factors except as outlined above   No unusual exposure hx or h/o childhood pna/ asthma or knowledge of premature birth.  Current Allergies, Complete Past Medical History, Past Surgical History, Family History, and Social History were reviewed in [employer] record.  ROS  The following are not active complaints unless bolded Hoarseness, sore throat, dysphagia, dental problems, itching, sneezing,  nasal congestion or discharge of excess mucus or purulent secretions, ear ache,   fever, chills, sweats, unintended wt loss or wt gain, classically pleuritic or exertional cp,  orthopnea pnd or arm/hand swelling  or leg swelling, presyncope, palpitations, abdominal pain, anorexia, nausea, vomiting, diarrhea  or change in bowel habits or change in bladder habits, change in stools or change in urine, dysuria, hematuria,  rash, arthralgias, visual complaints, headache, numbness, weakness or ataxia or problems with walking or coordination,  change in mood or  memory.  Current Meds  Medication Sig  . acetaminophen (TYLENOL ARTHRITIS PAIN) 650 MG CR tablet Take 650 mg by mouth every 8 (eight) hours as needed for pain.  Marland Kitchen albuterol (VENTOLIN HFA) 108 (90 Base) MCG/ACT inhaler Inhale 2 puffs into the lungs every 6 (six) hours as needed for wheezing or shortness of breath.  Marland Kitchen amLODipine (NORVASC) 5 MG tablet TAKE 1 TABLET BY MOUTH EVERY DAY  . atorvastatin (LIPITOR) 80 MG tablet TAKE 1 TABLET BY MOUTH EVERY DAY  . clobetasol cream (TEMOVATE) 2.77 % Apply 1 application topically 2 (two) times daily.  . diclofenac sodium (VOLTAREN) 1 % GEL APPLY 4 GRAMS TOPICALLY 4 TIMES A DAY AS NEEDED  . dorzolamide-timolol (COSOPT) 22.3-6.8 MG/ML ophthalmic  solution Place 2 drops into both eyes in the morning and at bedtime.  . fexofenadine (ALLEGRA) 180 MG tablet TAKE 1 TABLET (180 MG TOTAL) BY MOUTH DAILY.  . flecainide (TAMBOCOR) 100 MG tablet TAKE 1 TABLET BY MOUTH TWICE A DAY  . latanoprost (XALATAN) 0.005 % ophthalmic solution Place 1 drop into both eyes at bedtime.   . meclizine (ANTIVERT) 12.5 MG tablet TAKE 1 TABLET (12.5 MG TOTAL) BY MOUTH 3 (THREE) TIMES DAILY AS NEEDED FOR DIZZINESS.  . meloxicam (MOBIC) 7.5 MG tablet TAKE 1 TABLET BY MOUTH EVERY DAY AS NEEDED FOR PAIN  . omeprazole (PRILOSEC) 40 MG capsule TAKE 1 CAPSULE BY MOUTH EVERY DAY  . oxybutynin (DITROPAN-XL) 10 MG 24 hr tablet TAKE 1 TABLET (10 MG TOTAL) BY MOUTH DAILY.  . rivaroxaban (XARELTO) 20 MG TABS tablet Take 1 tablet (20 mg total) by mouth daily with supper.  . tamsulosin (FLOMAX) 0.4 MG CAPS capsule TAKE 1 CAPSULE BY MOUTH TWICE A DAY  . telmisartan (MICARDIS) 20 MG tablet TAKE 1 TABLET BY MOUTH EVERY DAY  . traMADol (ULTRAM) 50 MG tablet TAKE 1 TABLET BY MOUTH EVERY 6 HOURS AS NEEDED FOR PAIN           Past Medical History:  Diagnosis Date  . Abdominal pain, epigastric   . Allergic rhinitis, cause unspecified   . Arthritis   . BPH (benign prostatic hyperplasia)   . CHF (congestive heart failure) ([place])   . Chronic anticoagulation    xarelto  . Coronary artery calcification seen on CAT scan 12/26/2012  . Depressive disorder, not elsewhere classified   . Diverticulosis of colon (without mention of hemorrhage)   . Esophageal reflux   . High risk medication use   . Hyperlipidemia   . Hyperplastic rectal polyp   . Hypertension   . Lumbago   . Pacemaker   . Paralyzed hemidiaphragm    LEFT  . Persistent atrial fibrillation ([place])   . Sick sinus syndrome [facility]) July 2010   s/p PPM [facility]) by [name]       Objective:      stoic amb bm nad   07/19/2020          189  05/28/20 191 lb 9.6 oz (86.9 kg)  05/07/20 190 lb (86.2 kg)  04/18/20 191 lb  (86.6 kg)     Vital signs reviewed  07/19/2020  - Note at rest 02 sats  97% on RA   HEENT : pt wearing mask not removed for exam due to covid - 19 concerns.    NECK :  without JVD/Nodes/TM/ nl carotid upstrokes bilaterally   LUNGS: no acc muscle use,  Mild barrel  contour chest wall with bilateral  Distant bs s audible wheeze  Dullness Launa Flight  bs L base    CV:  RRR  no s3 or murmur or increase in P2, and no edema   ABD:  soft and nontender with pos end  insp Hoover's  in the supine position. No bruits or organomegaly appreciated, bowel sounds nl  MS:   Nl gait/  ext warm without deformities, calf tenderness, cyanosis or clubbing No obvious joint restrictions   SKIN: warm and dry without lesions    NEURO:  alert, approp, nl sensorium with  no motor or cerebellar deficits apparent.           CXR PA and Lateral:   05/28/2020 :    I personally reviewed images and  impression as follows:   No change elevated L HD - much more air though in LUQ stomach/ bowel than priors                Assessment

## 2020-07-19 NOTE — Assessment & Plan Note (Signed)
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

## 2020-07-19 NOTE — Patient Instructions (Addendum)
BREO  185  One click each am  - take two deep drags to make sure you get it all and hold for 5sec then out thru nose and rinse and gargle afterwards     To get the most out of exercise, you need to be continuously aware that you are short of breath, but never out of breath, for 30 minutes daily. As you improve, it will actually be easier for you to do the same amount of exercise  in  30 minutes so always push to the level where you are short of breath.      Please schedule a follow up visit in 3 months but call sooner if needed

## 2020-07-30 DIAGNOSIS — Z23 Encounter for immunization: Secondary | ICD-10-CM | POA: Diagnosis not present

## 2020-08-01 ENCOUNTER — Other Ambulatory Visit: Payer: Self-pay | Admitting: Internal Medicine

## 2020-08-10 ENCOUNTER — Telehealth: Payer: Self-pay | Admitting: Internal Medicine

## 2020-08-10 DIAGNOSIS — J449 Chronic obstructive pulmonary disease, unspecified: Secondary | ICD-10-CM

## 2020-08-12 MED ORDER — FLUTICASONE-SALMETEROL 250-50 MCG/DOSE IN AEPB: 1.0000 | INHALATION_SPRAY | Freq: Two times a day (BID) | RESPIRATORY_TRACT | 3 refills | Status: DC

## 2020-08-12 NOTE — Telephone Encounter (Signed)
Try wixella 250/50 one twice daily

## 2020-08-12 NOTE — Telephone Encounter (Signed)
Pt returning a phone call.pt can be reached at [phone].

## 2020-08-12 NOTE — Telephone Encounter (Signed)
Called and spoke with patient to let him know of Dr. Morrison Old recs as far as alternative to [facility].  Informed him that we could send in RX to preferred pharmacy or if he wanted to reach out to his insurance company and get the formulary we would know what was covered. Patient said to send RX to pharmacy and they would let him know either way. RX has been sent. Nothing further needed at this time.

## 2020-08-12 NOTE — Telephone Encounter (Signed)
Attempted to call pt but unable to reach. Left message for him to return call. °

## 2020-08-18 ENCOUNTER — Other Ambulatory Visit: Payer: Self-pay | Admitting: Internal Medicine

## 2020-08-18 NOTE — Telephone Encounter (Signed)
Done erx 

## 2020-08-19 DIAGNOSIS — Z23 Encounter for immunization: Secondary | ICD-10-CM | POA: Diagnosis not present

## 2020-08-27 ENCOUNTER — Ambulatory Visit (INDEPENDENT_AMBULATORY_CARE_PROVIDER_SITE_OTHER): Payer: Medicare Other | Admitting: Emergency Medicine

## 2020-08-27 ENCOUNTER — Other Ambulatory Visit: Payer: Self-pay

## 2020-08-27 DIAGNOSIS — I495 Sick sinus syndrome: Secondary | ICD-10-CM | POA: Diagnosis not present

## 2020-08-27 LAB — CUP PACEART INCLINIC DEVICE CHECK
Brady Statistic RA Percent Paced: 91 %
Brady Statistic RV Percent Paced: 0 %
Date Time Interrogation Session: 20211012090854
Implantable Lead Implant Date: 20100719
Implantable Lead Implant Date: 20100719
Implantable Lead Location: 753859
Implantable Lead Location: 753860
Implantable Lead Model: 4136
Implantable Lead Model: 4137
Implantable Lead Serial Number: 28454141
Implantable Lead Serial Number: 28594836
Implantable Pulse Generator Implant Date: 20100719
Lead Channel Impedance Value: 560 Ohm
Lead Channel Pacing Threshold Amplitude: 0.5 V
Lead Channel Pacing Threshold Pulse Width: 0.4 ms
Lead Channel Sensing Intrinsic Amplitude: 2.7 mV
Lead Channel Setting Pacing Amplitude: 2 V
Pulse Gen Serial Number: 764370

## 2020-08-27 NOTE — Patient Instructions (Signed)
A scheduler will call you to discuss gen change soon. Please call the [facility] with any questions or concerns at [phone].

## 2020-08-27 NOTE — Progress Notes (Signed)
ICD check in clinic. Normal device function. RA sensing and threshold consistent with previous device measurements. RV impedance elevated, known history. RA impedance trends stable over time. No mode switches. No ventricular arrhythmias. Histogram distribution appropriate for patient and level of activity. No changes made this session. Device programmed at appropriate safety margins. Battery has reached ERT 07/25/20. Patient education completed. Patient advised scheduler will call him to discuss gen change apt. Verbalized understanding.

## 2020-09-02 ENCOUNTER — Other Ambulatory Visit: Payer: Self-pay | Admitting: Internal Medicine

## 2020-09-18 ENCOUNTER — Encounter: Payer: Self-pay | Admitting: Internal Medicine

## 2020-09-18 ENCOUNTER — Other Ambulatory Visit: Payer: Self-pay

## 2020-09-18 ENCOUNTER — Ambulatory Visit (INDEPENDENT_AMBULATORY_CARE_PROVIDER_SITE_OTHER): Payer: Medicare Other | Admitting: Internal Medicine

## 2020-09-18 ENCOUNTER — Encounter: Payer: Self-pay | Admitting: *Deleted

## 2020-09-18 VITALS — BP 128/78 | HR 60 | Ht 69.0 in | Wt 189.0 lb

## 2020-09-18 DIAGNOSIS — I10 Essential (primary) hypertension: Secondary | ICD-10-CM

## 2020-09-18 DIAGNOSIS — I495 Sick sinus syndrome: Secondary | ICD-10-CM | POA: Diagnosis not present

## 2020-09-18 DIAGNOSIS — D6869 Other thrombophilia: Secondary | ICD-10-CM

## 2020-09-18 NOTE — H&P (View-Only) (Signed)
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  Since last being seen in our clinic, the patient reports doing very well.  SOB is stable.  Today, he denies symptoms of palpitations, chest pain,  lower extremity edema, dizziness, presyncope, or syncope.  The patient is otherwise without complaint today.   Past Medical History:  Diagnosis Date  . Abdominal pain, epigastric   . Allergic rhinitis, cause unspecified   . Arthritis   . BPH (benign prostatic hyperplasia)   . CHF (congestive heart failure) ([place])   . Chronic anticoagulation    xarelto  . Coronary artery calcification seen on CAT scan 12/26/2012  . Depressive disorder, not elsewhere classified   . Diverticulosis of colon (without mention of hemorrhage)   . Esophageal reflux   . High risk medication use   . Hyperlipidemia   . Hyperplastic rectal polyp   . Hypertension   . Lumbago   . Pacemaker   . Paralyzed hemidiaphragm    LEFT  . Persistent atrial fibrillation ([place])   . Sick sinus syndrome [facility]) July 2010   s/p PPM [facility]) by [name]   Past Surgical History:  Procedure Laterality Date  . ESOPHAGOGASTRODUODENOSCOPY N/A 12/05/2013   Procedure: ESOPHAGOGASTRODUODENOSCOPY (EGD);  Surgeon: [name], MD;  Location: [facility];  Service: Endoscopy;  Laterality: N/A;  . ESOPHAGOGASTRODUODENOSCOPY N/A 07/04/2015   Procedure: ESOPHAGOGASTRODUODENOSCOPY (EGD);  Surgeon: [name], MD;  Location: [facility];  Service: Endoscopy;  Laterality: N/A;  . INSERT / REPLACE / REMOVE PACEMAKER  05/2009    [employer] ALTRUA 60  (serial number [id number]) dual-chamber pacemaker  . PACEMAKER PLACEMENT Bilateral    '05    ROS- all systems are reviewed and negative except as per HPI above  Current Outpatient Medications  Medication Sig Dispense Refill  . acetaminophen (TYLENOL ARTHRITIS PAIN) 650 MG CR tablet Take 650 mg  by mouth every 8 (eight) hours as needed for pain.    Marland Kitchen amLODipine (NORVASC) 5 MG tablet TAKE 1 TABLET BY MOUTH EVERY DAY 90 tablet 1  . atorvastatin (LIPITOR) 80 MG tablet TAKE 1 TABLET BY MOUTH EVERY DAY 90 tablet 3  . clobetasol cream (TEMOVATE) 8.85 % Apply 1 application topically 2 (two) times daily. 30 g 2  . dorzolamide-timolol (COSOPT) 22.3-6.8 MG/ML ophthalmic solution Place 2 drops into both eyes in the morning and at bedtime.    . fexofenadine (ALLEGRA) 180 MG tablet TAKE 1 TABLET (180 MG TOTAL) BY MOUTH DAILY. 90 tablet 1  . flecainide (TAMBOCOR) 100 MG tablet TAKE 1 TABLET BY MOUTH TWICE A DAY 180 tablet 3  . latanoprost (XALATAN) 0.005 % ophthalmic solution Place 1 drop into both eyes at bedtime.     . meclizine (ANTIVERT) 12.5 MG tablet TAKE 1 TABLET BY MOUTH 3 TIMES A DAY AS NEEDED FOR DIZZINESS 30 tablet 2  . meloxicam (MOBIC) 7.5 MG tablet TAKE 1 TABLET BY MOUTH EVERY DAY AS NEEDED FOR PAIN 30 tablet 5  . omeprazole (PRILOSEC) 40 MG capsule TAKE 1 CAPSULE BY MOUTH EVERY DAY 90 capsule 3  . oxybutynin (DITROPAN-XL) 10 MG 24 hr tablet TAKE 1 TABLET (10 MG TOTAL) BY MOUTH DAILY. 90 tablet 1  . tamsulosin (FLOMAX) 0.4 MG CAPS capsule TAKE 1 CAPSULE BY MOUTH TWICE A DAY 180 capsule 2  . telmisartan (MICARDIS) 20 MG tablet TAKE 1 TABLET BY MOUTH  EVERY DAY 90 tablet 3  . traMADol (ULTRAM) 50 MG tablet TAKE 1 TABLET BY MOUTH EVERY 6 HOURS AS NEEDED FOR PAIN 120 tablet 2  . XARELTO 20 MG TABS tablet TAKE 1 TABLET (20 MG TOTAL) BY MOUTH DAILY WITH SUPPER. 90 tablet 2   No current facility-administered medications for this visit.    Physical Exam: Vitals:   09/18/20 1438  BP: 128/78  Pulse: 60  SpO2: 97%  Weight: 189 lb (85.7 kg)  Height: 5\' 9"  (1.753 m)    GEN- The patient is well appearing, alert and oriented x 3 today.   Head- normocephalic, atraumatic Eyes-  Sclera clear, conjunctiva pink Ears- hearing intact Oropharynx- clear Lungs- Clear to ausculation bilaterally,  normal work of breathing Chest- pacemaker pocket is well healed Heart- Regular rate and rhythm, no murmurs, rubs or gallops, PMI not laterally displaced GI- soft, NT, ND, + BS Extremities- no clubbing, cyanosis, or edema  Pacemaker interrogation- reviewed in detail today,  See PACEART report  ekg tracing ordered today is personally reviewed and shows atrial paced rhyth, PR 192 msec, QRS 150 msec (RBBB)  Assessment and Plan:  1. Symptomatic sinus bradycardia  See [name] report He is at [facility]. 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  He previously paced 18% in the V.  He atrial paces 81 % Risks, benefits, and alternatives to PPM pulse generator replacement with new RV lead placement were discussed in detail today.  The patient understands that risks include but are not limited to bleeding, infection, pneumothorax, perforation, tamponade, vascular damage, renal failure, MI, stroke, death, damage to his existing leads, and lead dislodgement and wishes to proceed.  We will therefore schedule the procedure at the next available time. Hold xarelto 24 hours prior to the procedure  2. afib Well controlled He is on xarelto for chads2vasc score of 3  3. HTN Stable No change required today  4. SOB/ fatigue Stable Low risk myoview and echo reviewed with him again today No change required today Follows with [name]  Risks, benefits and potential toxicities for medications prescribed and/or refilled reviewed with patient today.   Thompson Grayer MD, [facility] 09/18/2020 2:49 PM

## 2020-09-18 NOTE — Patient Instructions (Addendum)
Medication Instructions:  Your physician recommends that you continue on your current medications as directed. Please refer to the Current Medication list given to you today.  *If you need a refill on your cardiac medications before your next appointment, please call your pharmacy*  Lab Work: CBC, BMP  If you have labs (blood work) drawn today and your tests are completely normal, you will receive your results only by: Marland Kitchen MyChart Message (if you have MyChart) OR . A paper copy in the mail If you have any lab test that is abnormal or we need to change your treatment, we will call you to review the results.  Testing/Procedures: New pacemaker lead and generator change   Follow-Up: At [facility], you and your health needs are our priority.  As part of our continuing mission to provide you with exceptional heart care, we have created designated Provider Care Teams.  These Care Teams include your primary Cardiologist (physician) and Advanced Practice Providers (APPs -  Physician Assistants and Nurse Practitioners) who all work together to provide you with the care you need, when you need it.  We recommend signing up for the patient portal called "MyChart".  Sign up information is provided on this After Visit Summary.  MyChart is used to connect with patients for Virtual Visits (Telemedicine).  Patients are able to view lab/test results, encounter notes, upcoming appointments, etc.  Non-urgent messages can be sent to your provider as well.   To learn more about what you can do with MyChart, go to [email or web address].      Other Instructions:  Pacemaker Implantation, Adult Pacemaker implantation is a procedure to place a pacemaker inside your chest. A pacemaker is a small computer that sends electrical signals to the heart and helps your heart beat normally. A pacemaker also stores information about your heart rhythms. You may need pacemaker implantation if you:  Have a slow heartbeat  (bradycardia).  Faint (syncope).  Have shortness of breath (dyspnea) due to heart problems. The pacemaker attaches to your heart through a wire, called a lead. Sometimes just one lead is needed. Other times, there will be two leads. There are two types of pacemakers:  Transvenous pacemaker. This type is placed under the skin or muscle of your chest. The lead goes through a vein in the chest area to reach the inside of the heart.  Epicardial pacemaker. This type is placed under the skin or muscle of your chest or belly. The lead goes through your chest to the outside of the heart. Tell a health care provider about:  Any allergies you have.  All medicines you are taking, including vitamins, herbs, eye drops, creams, and over-the-counter medicines.  Any problems you or family members have had with anesthetic medicines.  Any blood or bone disorders you have.  Any surgeries you have had.  Any medical conditions you have.  Whether you are pregnant or may be pregnant. What are the risks? Generally, this is a safe procedure. However, problems may occur, including:  Infection.  Bleeding.  Failure of the pacemaker or the lead.  Collapse of a lung or bleeding into a lung.  Blood clot inside a blood vessel with a lead.  Damage to the heart.  Infection inside the heart (endocarditis).  Allergic reactions to medicines. What happens before the procedure? Staying hydrated Follow instructions from your health care provider about hydration, which may include:  Up to 2 hours before the procedure - you may continue to drink clear liquids, such as  water, clear fruit juice, black coffee, and plain tea. Eating and drinking restrictions Follow instructions from your health care provider about eating and drinking, which may include:  8 hours before the procedure - stop eating heavy meals or foods such as meat, fried foods, or fatty foods.  6 hours before the procedure - stop eating light  meals or foods, such as toast or cereal.  6 hours before the procedure - stop drinking milk or drinks that contain milk.  2 hours before the procedure - stop drinking clear liquids. Medicines  Ask your health care provider about: ? Changing or stopping your regular medicines. This is especially important if you are taking diabetes medicines or blood thinners. ? Taking medicines such as aspirin and ibuprofen. These medicines can thin your blood. Do not take these medicines before your procedure if your health care provider instructs you not to.  You may be given antibiotic medicine to help prevent infection. General instructions  You will have a heart evaluation. This may include an electrocardiogram (ECG), chest X-ray, and heart imaging (echocardiogram,  or echo) tests.  You will have blood tests.  Do not use any products that contain nicotine or tobacco, such as cigarettes and e-cigarettes. If you need help quitting, ask your health care provider.  Plan to have someone take you home from the hospital or clinic.  If you will be going home right after the procedure, plan to have someone with you for 24 hours.  Ask your health care provider how your surgical site will be marked or identified. What happens during the procedure?  To reduce your risk of infection: ? Your health care team will wash or sanitize their hands. ? Your skin will be washed with soap. ? Hair may be removed from the surgical area.  An IV tube will be inserted into one of your veins.  You will be given one or more of the following: ? A medicine to help you relax (sedative). ? A medicine to numb the area (local anesthetic). ? A medicine to make you fall asleep (general anesthetic).  If you are getting a transvenous pacemaker: ? An incision will be made in your upper chest. ? A pocket will be made for the pacemaker. It may be placed under the skin or between layers of muscle. ? The lead will be inserted into  a blood vessel that returns to the heart. ? While X-rays are taken by an imaging machine (fluoroscopy), the lead will be advanced through the vein to the inside of your heart. ? The other end of the lead will be tunneled under the skin and attached to the pacemaker.  If you are getting an epicardial pacemaker: ? An incision will be made near your ribs or breastbone (sternum) for the lead. ? The lead will be attached to the outside of your heart. ? Another incision will be made in your chest or upper belly to create a pocket for the pacemaker. ? The free end of the lead will be tunneled under the skin and attached to the pacemaker.  The transvenous or epicardial pacemaker will be tested. Imaging studies may be done to check the lead position.  The incisions will be closed with stitches (sutures), adhesive strips, or skin glue.  Bandages (dressing) will be placed over the incisions. The procedure may vary among health care providers and hospitals. What happens after the procedure?  Your blood pressure, heart rate, breathing rate, and blood oxygen level will be monitored until  the medicines you were given have worn off.  You will be given antibiotics and pain medicine.  ECG and chest x-rays will be done.  You will wear a continuous type of ECG (Holter monitor) to check your heart rhythm.  Your health care provider will program the pacemaker.  Do not drive for 24 hours if you received a sedative. This information is not intended to replace advice given to you by your health care provider. Make sure you discuss any questions you have with your health care provider. Document Revised: 07/22/2018 Document Reviewed: 04/15/2016 Elsevier Patient Education  [place].

## 2020-09-18 NOTE — Progress Notes (Signed)
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  Since last being seen in our clinic, the patient reports doing very well.  SOB is stable.  Today, he denies symptoms of palpitations, chest pain,  lower extremity edema, dizziness, presyncope, or syncope.  The patient is otherwise without complaint today.   Past Medical History:  Diagnosis Date  . Abdominal pain, epigastric   . Allergic rhinitis, cause unspecified   . Arthritis   . BPH (benign prostatic hyperplasia)   . CHF (congestive heart failure) ([place])   . Chronic anticoagulation    xarelto  . Coronary artery calcification seen on CAT scan 12/26/2012  . Depressive disorder, not elsewhere classified   . Diverticulosis of colon (without mention of hemorrhage)   . Esophageal reflux   . High risk medication use   . Hyperlipidemia   . Hyperplastic rectal polyp   . Hypertension   . Lumbago   . Pacemaker   . Paralyzed hemidiaphragm    LEFT  . Persistent atrial fibrillation ([place])   . Sick sinus syndrome [facility]) July 2010   s/p PPM [facility]) by [name]   Past Surgical History:  Procedure Laterality Date  . ESOPHAGOGASTRODUODENOSCOPY N/A 12/05/2013   Procedure: ESOPHAGOGASTRODUODENOSCOPY (EGD);  Surgeon: [name], MD;  Location[facility];  Service: Endoscopy;  Laterality: N/A;  . ESOPHAGOGASTRODUODENOSCOPY N/A 07/04/2015   Procedure: ESOPHAGOGASTRODUODENOSCOPY (EGD);  Surgeon: [name], MD;  Location: [place];  Service: Endoscopy;  Laterality: N/A;  . INSERT / REPLACE / REMOVE PACEMAKER  05/2009    [employer] ALTRUA 60  (serial number [id number]) dual-chamber pacemaker  . PACEMAKER PLACEMENT Bilateral    '05    ROS- all systems are reviewed and negative except as per HPI above  Current Outpatient Medications  Medication Sig Dispense Refill  . acetaminophen (TYLENOL ARTHRITIS PAIN) 650 MG CR tablet Take 650 mg  by mouth every 8 (eight) hours as needed for pain.    Marland Kitchen amLODipine (NORVASC) 5 MG tablet TAKE 1 TABLET BY MOUTH EVERY DAY 90 tablet 1  . atorvastatin (LIPITOR) 80 MG tablet TAKE 1 TABLET BY MOUTH EVERY DAY 90 tablet 3  . clobetasol cream (TEMOVATE) 1.61 % Apply 1 application topically 2 (two) times daily. 30 g 2  . dorzolamide-timolol (COSOPT) 22.3-6.8 MG/ML ophthalmic solution Place 2 drops into both eyes in the morning and at bedtime.    . fexofenadine (ALLEGRA) 180 MG tablet TAKE 1 TABLET (180 MG TOTAL) BY MOUTH DAILY. 90 tablet 1  . flecainide (TAMBOCOR) 100 MG tablet TAKE 1 TABLET BY MOUTH TWICE A DAY 180 tablet 3  . latanoprost (XALATAN) 0.005 % ophthalmic solution Place 1 drop into both eyes at bedtime.     . meclizine (ANTIVERT) 12.5 MG tablet TAKE 1 TABLET BY MOUTH 3 TIMES A DAY AS NEEDED FOR DIZZINESS 30 tablet 2  . meloxicam (MOBIC) 7.5 MG tablet TAKE 1 TABLET BY MOUTH EVERY DAY AS NEEDED FOR PAIN 30 tablet 5  . omeprazole (PRILOSEC) 40 MG capsule TAKE 1 CAPSULE BY MOUTH EVERY DAY 90 capsule 3  . oxybutynin (DITROPAN-XL) 10 MG 24 hr tablet TAKE 1 TABLET (10 MG TOTAL) BY MOUTH DAILY. 90 tablet 1  . tamsulosin (FLOMAX) 0.4 MG CAPS capsule TAKE 1 CAPSULE BY MOUTH TWICE A DAY 180 capsule 2  . telmisartan (MICARDIS) 20 MG tablet TAKE 1 TABLET BY MOUTH  EVERY DAY 90 tablet 3  . traMADol (ULTRAM) 50 MG tablet TAKE 1 TABLET BY MOUTH EVERY 6 HOURS AS NEEDED FOR PAIN 120 tablet 2  . XARELTO 20 MG TABS tablet TAKE 1 TABLET (20 MG TOTAL) BY MOUTH DAILY WITH SUPPER. 90 tablet 2   No current facility-administered medications for this visit.    Physical Exam: Vitals:   09/18/20 1438  BP: 128/78  Pulse: 60  SpO2: 97%  Weight: 189 lb (85.7 kg)  Height: 5\' 9"  (1.753 m)    GEN- The patient is well appearing, alert and oriented x 3 today.   Head- normocephalic, atraumatic Eyes-  Sclera clear, conjunctiva pink Ears- hearing intact Oropharynx- clear Lungs- Clear to ausculation bilaterally,  normal work of breathing Chest- pacemaker pocket is well healed Heart- Regular rate and rhythm, no murmurs, rubs or gallops, PMI not laterally displaced GI- soft, NT, ND, + BS Extremities- no clubbing, cyanosis, or edema  Pacemaker interrogation- reviewed in detail today,  See PACEART report  ekg tracing ordered today is personally reviewed and shows atrial paced rhyth, PR 192 msec, QRS 150 msec (RBBB)  Assessment and Plan:  1. Symptomatic sinus bradycardia  See [name] report He is at [facility]. 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  He previously paced 18% in the V.  He atrial paces 81 % Risks, benefits, and alternatives to PPM pulse generator replacement with new RV lead placement were discussed in detail today.  The patient understands that risks include but are not limited to bleeding, infection, pneumothorax, perforation, tamponade, vascular damage, renal failure, MI, stroke, death, damage to his existing leads, and lead dislodgement and wishes to proceed.  We will therefore schedule the procedure at the next available time. Hold xarelto 24 hours prior to the procedure  2. afib Well controlled He is on xarelto for chads2vasc score of 3  3. HTN Stable No change required today  4. SOB/ fatigue Stable Low risk myoview and echo reviewed with him again today No change required today Follows with [name]  Risks, benefits and potential toxicities for medications prescribed and/or refilled reviewed with patient today.   Thompson Grayer MD, [facility] 09/18/2020 2:49 PM

## 2020-09-19 LAB — CBC WITH DIFFERENTIAL/PLATELET
Basophils Absolute: 0.1 10*3/uL (ref 0.0–0.2)
Basos: 1 %
EOS (ABSOLUTE): 0.1 10*3/uL (ref 0.0–0.4)
Eos: 1 %
Hematocrit: 42.9 % (ref 37.5–51.0)
Hemoglobin: 14.7 g/dL (ref 13.0–17.7)
Immature Grans (Abs): 0 10*3/uL (ref 0.0–0.1)
Immature Granulocytes: 0 %
Lymphocytes Absolute: 2.1 10*3/uL (ref 0.7–3.1)
Lymphs: 28 %
MCH: 31.6 pg (ref 26.6–33.0)
MCHC: 34.3 g/dL (ref 31.5–35.7)
MCV: 92 fL (ref 79–97)
Monocytes Absolute: 0.9 10*3/uL (ref 0.1–0.9)
Monocytes: 12 %
Neutrophils Absolute: 4.4 10*3/uL (ref 1.4–7.0)
Neutrophils: 58 %
Platelets: 195 10*3/uL (ref 150–450)
RBC: 4.65 x10E6/uL (ref 4.14–5.80)
RDW: 13.4 % (ref 11.6–15.4)
WBC: 7.5 10*3/uL (ref 3.4–10.8)

## 2020-09-19 LAB — BASIC METABOLIC PANEL
BUN/Creatinine Ratio: 13 (ref 10–24)
BUN: 12 mg/dL (ref 8–27)
CO2: 24 mmol/L (ref 20–29)
Calcium: 9.3 mg/dL (ref 8.6–10.2)
Chloride: 107 mmol/L — ABNORMAL HIGH (ref 96–106)
Creatinine, Ser: 0.95 mg/dL (ref 0.76–1.27)
GFR calc Af Amer: 89 mL/min/{1.73_m2} (ref >59)
GFR calc non Af Amer: 77 mL/min/{1.73_m2} (ref >59)
Glucose: 91 mg/dL (ref 65–99)
Potassium: 4.3 mmol/L (ref 3.5–5.2)
Sodium: 147 mmol/L — ABNORMAL HIGH (ref 134–144)

## 2020-10-07 ENCOUNTER — Ambulatory Visit: Payer: Medicare Other | Admitting: Internal Medicine

## 2020-10-14 ENCOUNTER — Other Ambulatory Visit (HOSPITAL_COMMUNITY)
Admission: RE | Admit: 2020-10-14 | Discharge: 2020-10-14 | Disposition: A | Discharge status: Home / Self Care | Payer: Medicare Other | Source: Ambulatory Visit | Attending: Internal Medicine | Admitting: Internal Medicine

## 2020-10-14 DIAGNOSIS — Z20822 Contact with and (suspected) exposure to covid-19: Secondary | ICD-10-CM | POA: Diagnosis not present

## 2020-10-14 DIAGNOSIS — Z01812 Encounter for preprocedural laboratory examination: Secondary | ICD-10-CM | POA: Insufficient documentation

## 2020-10-14 LAB — SARS CORONAVIRUS 2 (TAT 6-24 HRS): SARS Coronavirus 2: NEGATIVE

## 2020-10-14 NOTE — Progress Notes (Signed)
Instructed patient on the following items: Arrival time 1130 Nothing to eat or drink after midnight No meds AM of procedure Responsible person to drive you home and stay with you for 24 hrs Wash with special soap night before and morning of procedure If on anti-coagulant drug instructions Hold Xarelto

## 2020-10-15 ENCOUNTER — Other Ambulatory Visit: Payer: Self-pay

## 2020-10-15 ENCOUNTER — Ambulatory Visit (HOSPITAL_COMMUNITY)
Admission: RE | Admit: 2020-10-15 | Discharge: 2020-10-15 | Disposition: A | Discharge status: Home / Self Care | Payer: Medicare Other | Attending: Internal Medicine | Admitting: Internal Medicine

## 2020-10-15 ENCOUNTER — Ambulatory Visit (HOSPITAL_COMMUNITY): Payer: Medicare Other

## 2020-10-15 ENCOUNTER — Encounter (HOSPITAL_COMMUNITY)
Admission: RE | Disposition: A | Discharge status: Home / Self Care | Payer: Self-pay | Source: Home / Self Care | Attending: Internal Medicine

## 2020-10-15 DIAGNOSIS — Z950 Presence of cardiac pacemaker: Secondary | ICD-10-CM | POA: Diagnosis not present

## 2020-10-15 DIAGNOSIS — R001 Bradycardia, unspecified: Secondary | ICD-10-CM | POA: Insufficient documentation

## 2020-10-15 DIAGNOSIS — Z7901 Long term (current) use of anticoagulants: Secondary | ICD-10-CM | POA: Insufficient documentation

## 2020-10-15 DIAGNOSIS — I10 Essential (primary) hypertension: Secondary | ICD-10-CM | POA: Insufficient documentation

## 2020-10-15 DIAGNOSIS — R0602 Shortness of breath: Secondary | ICD-10-CM | POA: Insufficient documentation

## 2020-10-15 DIAGNOSIS — I4891 Unspecified atrial fibrillation: Secondary | ICD-10-CM | POA: Insufficient documentation

## 2020-10-15 DIAGNOSIS — Z79899 Other long term (current) drug therapy: Secondary | ICD-10-CM | POA: Insufficient documentation

## 2020-10-15 DIAGNOSIS — T82110A Breakdown (mechanical) of cardiac electrode, initial encounter: Secondary | ICD-10-CM | POA: Diagnosis not present

## 2020-10-15 DIAGNOSIS — I495 Sick sinus syndrome: Secondary | ICD-10-CM | POA: Diagnosis not present

## 2020-10-15 DIAGNOSIS — Z45010 Encounter for checking and testing of cardiac pacemaker pulse generator [battery]: Secondary | ICD-10-CM | POA: Insufficient documentation

## 2020-10-15 HISTORY — PX: LEAD REVISION/REPAIR: EP1213

## 2020-10-15 HISTORY — PX: PPM GENERATOR CHANGEOUT: EP1233

## 2020-10-15 SURGERY — LEAD REVISION/REPAIR

## 2020-10-15 MED ORDER — SODIUM CHLORIDE 0.9 % IV SOLN: 250.0000 mL | INTRAVENOUS | Status: DC | PRN

## 2020-10-15 MED ORDER — SODIUM CHLORIDE 0.9 % IV SOLN: INTRAVENOUS | Status: DC

## 2020-10-15 MED ORDER — HEPARIN (PORCINE) IN NACL 1000-0.9 UT/500ML-% IV SOLN
INTRAVENOUS | Status: DC | PRN
  Administered 2020-10-15: 500 mL

## 2020-10-15 MED ORDER — SODIUM CHLORIDE 0.9 % IV SOLN
80.0000 mg | INTRAVENOUS | Status: AC
  Administered 2020-10-15: 80 mg

## 2020-10-15 MED ORDER — SODIUM CHLORIDE 0.9% FLUSH: 3.0000 mL | Freq: Two times a day (BID) | INTRAVENOUS | Status: DC

## 2020-10-15 MED ORDER — IOHEXOL 350 MG/ML SOLN
INTRAVENOUS | Status: DC | PRN
  Administered 2020-10-15: 15 mL

## 2020-10-15 MED ORDER — CEFAZOLIN SODIUM-DEXTROSE 2-4 GM/100ML-% IV SOLN
INTRAVENOUS | Status: AC
  Filled 2020-10-15: qty 100

## 2020-10-15 MED ORDER — ONDANSETRON HCL 4 MG/2ML IJ SOLN: 4.0000 mg | Freq: Four times a day (QID) | INTRAMUSCULAR | Status: DC | PRN

## 2020-10-15 MED ORDER — CEFAZOLIN SODIUM-DEXTROSE 2-4 GM/100ML-% IV SOLN
2.0000 g | INTRAVENOUS | Status: AC
  Administered 2020-10-15: 2 g via INTRAVENOUS

## 2020-10-15 MED ORDER — MIDAZOLAM HCL 5 MG/5ML IJ SOLN
INTRAMUSCULAR | Status: DC | PRN
  Administered 2020-10-15 (×3): 1 mg via INTRAVENOUS

## 2020-10-15 MED ORDER — SODIUM CHLORIDE 0.9 % IV SOLN
INTRAVENOUS | Status: AC
  Filled 2020-10-15: qty 2

## 2020-10-15 MED ORDER — MIDAZOLAM HCL 5 MG/5ML IJ SOLN
INTRAMUSCULAR | Status: AC
  Filled 2020-10-15: qty 5

## 2020-10-15 MED ORDER — LIDOCAINE HCL (PF) 1 % IJ SOLN
INTRAMUSCULAR | Status: AC
  Filled 2020-10-15: qty 60

## 2020-10-15 MED ORDER — FENTANYL CITRATE (PF) 100 MCG/2ML IJ SOLN
INTRAMUSCULAR | Status: AC
  Filled 2020-10-15: qty 2

## 2020-10-15 MED ORDER — SODIUM CHLORIDE 0.9% FLUSH: 3.0000 mL | INTRAVENOUS | Status: DC | PRN

## 2020-10-15 MED ORDER — ACETAMINOPHEN 325 MG PO TABS: 325.0000 mg | ORAL_TABLET | ORAL | Status: DC | PRN

## 2020-10-15 MED ORDER — HEPARIN (PORCINE) IN NACL 1000-0.9 UT/500ML-% IV SOLN
INTRAVENOUS | Status: AC
  Filled 2020-10-15: qty 500

## 2020-10-15 MED ORDER — LIDOCAINE HCL (PF) 1 % IJ SOLN
INTRAMUSCULAR | Status: DC | PRN
  Administered 2020-10-15: 60 mL

## 2020-10-15 MED ORDER — FENTANYL CITRATE (PF) 100 MCG/2ML IJ SOLN
INTRAMUSCULAR | Status: DC | PRN
  Administered 2020-10-15: 25 ug via INTRAVENOUS

## 2020-10-15 SURGICAL SUPPLY — 7 items
CABLE SURGICAL S-101-97-12 (CABLE) ×2 IMPLANT
KIT MICROPUNCTURE NIT STIFF (SHEATH) ×1 IMPLANT
LEAD INGEVITY 7842 59 (Lead) ×1 IMPLANT
PACEMAKER ACCOLADE DR-EL (Pacemaker) ×1 IMPLANT
PAD PRO RADIOLUCENT 2001M-C (PAD) ×2 IMPLANT
SHEATH 7FR PRELUDE SNAP 13 (SHEATH) ×1 IMPLANT
TRAY PACEMAKER INSERTION (PACKS) ×2 IMPLANT

## 2020-10-15 NOTE — Interval H&P Note (Signed)
History and Physical Interval Note:  10/15/2020 1:02 PM  [name]  has presented today for surgery, with the diagnosis of ERI, lead failure.  The various methods of treatment have been discussed with the patient and family. After consideration of risks, benefits and other options for treatment, the patient has consented to  Procedure(s): LEAD REVISION/REPAIR (N/A) PPM GENERATOR CHANGEOUT (N/A) as a surgical intervention.  The patient's history has been reviewed, patient examined, no change in status, stable for surgery.  I have reviewed the patient's chart and labs.  Questions were answered to the patient's satisfaction.     Risks, benefits, and alternatives to pacemaker pulse generator replacement with RV lead revision were discussed in detail today.  The patient understands that risks include but are not limited to bleeding, infection, pneumothorax, perforation, tamponade, vascular damage, renal failure, MI, stroke, death,  damage to his existing leads, and lead dislodgement and wishes to proceed.       Thompson Grayer

## 2020-10-15 NOTE — Discharge Instructions (Signed)
Resume xarelto 10/18/2020.      Supplemental Discharge Instructions for  Pacemaker/Defibrillator Patients  Tomorrow, 10/16/2020, send in a device transmission  Activity No heavy lifting or vigorous activity with your left/right arm for 6 to 8 weeks.  Do not raise your left/right arm above your head for one week.  Gradually raise your affected arm as drawn below.             10/19/2020                 10/20/2020                10/21/2020              10/22/2020  __  NO DRIVING for  1 week   ; you may begin driving on  25/0/5397   .  WOUND CARE - Keep the wound area clean and dry.  Do not get this area wet , no showers until cleared to at your wound check visit . - Tomorrow, 10/16/2020, remove the arm sling    Tomorrow, 10/16/2020 remove the outer plastic bandage.  Underneath the plastic bandage there are steri strips (paper tapes), DO NOT remove these.    The tape/steri-strips on your wound will fall off; do not pull them off.  No bandage is needed on the site.  DO  NOT apply any creams, oils, or ointments to the wound area. - If you notice any drainage or discharge from the wound, any swelling or bruising at the site, or you develop a fever > 101? F after you are discharged home, call the office at once.  Special Instructions - You are still able to use cellular telephones; use the ear opposite the side where you have your pacemaker/defibrillator.  Avoid carrying your cellular phone near your device. - When traveling through airports, show security personnel your identification card to avoid being screened in the metal detectors.  Ask the security personnel to use the hand wand. - Avoid arc welding equipment, MRI testing (magnetic resonance imaging), TENS units (transcutaneous nerve stimulators).  Call the office for questions about other devices. - Avoid electrical appliances that are in poor condition or are not properly grounded. - Microwave ovens are safe to be near or to operate.

## 2020-10-15 NOTE — Progress Notes (Signed)
Patient was given discharge instructions. He verbalized understanding. 

## 2020-10-16 ENCOUNTER — Encounter (HOSPITAL_COMMUNITY): Payer: Self-pay | Admitting: Internal Medicine

## 2020-10-17 ENCOUNTER — Encounter (HOSPITAL_COMMUNITY): Payer: Self-pay | Admitting: Internal Medicine

## 2020-10-21 ENCOUNTER — Encounter: Payer: Self-pay | Admitting: Internal Medicine

## 2020-10-21 ENCOUNTER — Ambulatory Visit (INDEPENDENT_AMBULATORY_CARE_PROVIDER_SITE_OTHER): Payer: Medicare Other | Admitting: Internal Medicine

## 2020-10-21 ENCOUNTER — Other Ambulatory Visit: Payer: Self-pay

## 2020-10-21 DIAGNOSIS — R0609 Other forms of dyspnea: Secondary | ICD-10-CM

## 2020-10-21 DIAGNOSIS — J449 Chronic obstructive pulmonary disease, unspecified: Secondary | ICD-10-CM | POA: Diagnosis not present

## 2020-10-21 DIAGNOSIS — R0600 Dyspnea, unspecified: Secondary | ICD-10-CM | POA: Diagnosis not present

## 2020-10-21 MED ORDER — TRELEGY ELLIPTA 100-62.5-25 MCG/INH IN AEPB: INHALATION_SPRAY | RESPIRATORY_TRACT | 11 refills | Status: DC

## 2020-10-21 NOTE — Progress Notes (Signed)
[name], male    DOB: 08-Sep-1941    MRN: [id number]   Brief patient profile:  79 yobm quit smoking in 1993 s sequelae and eval by [facility] 2015 for doe felt to be due to paralyzed L HD with restrictive changes and some worse since then esp x 02/2020 so referred to pulmonary clinic 05/28/2020 by Dr   Jenny Reichmann for reeval      History of Present Illness  05/28/2020  Pulmonary/ 1st office eval/[facility] s/p pfizer 01/08/20 then moderna  05/07/20   Chief Complaint  Patient presents with  . Pulmonary Consult    Referred by Dr. Cathlean Cower. Pt seen here by Dr Lamonte Sakai last in 2015- he c/o worsening SOB over the past 2-3 months, tires easily.   Dyspnea:  Slowing down seems to help sob,  Goal is to  be able to push a mower for  more than 50 yards without stopping  MMRC3 = can't walk 100 yards even at a slow pace at a flat grade s stopping due to sob. Cough: none Sleep: able to lie flat/ one pillow/ arthritis disturbance  SABA use: no better  rec To get the most out of exercise, you need to be continuously aware that you are short of breath, but never out of breath, for 30 minutes daily. As you improve, it will actually be easier for you to do the same amount of exercise  in  30 minutes so always push to the level where you are short of breath.        07/19/2020  f/u ov/Bowe Sidor re:  Paralyzed L HD since pacemaker / very sedentary c/o doe Chief Complaint  Patient presents with  . Follow-up    pt is here to go over pft  Dyspnea: MMRC2 = can't walk a nl pace on a flat grade s sob but does fine slow and flat but really not very active at all and has trouble articulating any regular activity where sob. 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 Follow-up    still DOE, inhaler did not work  Dyspnea:  50 ft  To car  / mb is 50 ft flat  Cough: none  Sleeping: sleep flat bed / one pillow no resp cc SABA use: none  02: none   No obvious day to day or daytime variability or assoc excess/ purulent sputum or mucus plugs or hemoptysis or cp or chest tightness, subjective wheeze or overt sinus or hb symptoms.   sleping  without nocturnal  or early am exacerbation  of respiratory  c/o's or need for noct saba. Also denies any obvious fluctuation of symptoms with weather or environmental changes or other aggravating or alleviating factors except as outlined above   No unusual exposure hx or h/o childhood pna/ asthma or knowledge of premature birth.  Current Allergies, Complete Past Medical  History, Past Surgical History, Family History, and Social History were reviewed in [employer] record.  ROS  The following are not active complaints unless bolded Hoarseness, sore throat, dysphagia, dental problems, itching, sneezing,  nasal congestion or discharge of excess mucus or purulent secretions, ear ache,   fever, chills, sweats, unintended wt loss or wt gain, classically pleuritic or exertional cp,  orthopnea pnd or arm/hand swelling  or leg swelling, presyncope, palpitations, abdominal pain, anorexia, nausea, vomiting, diarrhea  or change in bowel habits or change in bladder habits, change in stools or change in urine, dysuria, hematuria,  rash, arthralgias, visual complaints, headache, numbness, weakness or ataxia or problems with walking or coordination,  change in mood or  memory.        Current Meds  Medication Sig  . acetaminophen (TYLENOL ARTHRITIS PAIN) 650 MG CR tablet Take  1,300 mg by mouth in the morning and at bedtime.   Marland Kitchen amLODipine (NORVASC) 5 MG tablet TAKE 1 TABLET BY MOUTH EVERY DAY (Patient taking differently: Take 5 mg by mouth daily. )  . atorvastatin (LIPITOR) 80 MG tablet TAKE 1 TABLET BY MOUTH EVERY DAY (Patient taking differently: Take 80 mg by mouth daily. )  . Cholecalciferol (VITAMIN D3) 50 MCG (2000 UT) TABS Take 2,000 Units by mouth daily.  . clobetasol cream (TEMOVATE) 3.47 % Apply 1 application topically 2 (two) times daily. (Patient taking differently: Apply 1 application topically 2 (two) times daily as needed (Itching). )  . dorzolamide-timolol (COSOPT) 22.3-6.8 MG/ML ophthalmic solution Place 2 drops into both eyes in the morning and at bedtime.  . fexofenadine (ALLEGRA) 180 MG tablet TAKE 1 TABLET (180 MG TOTAL) BY MOUTH DAILY. (Patient taking differently: Take 180 mg by mouth daily. )  . Fluticasone-Salmeterol (ADVAIR) 250-50 MCG/DOSE AEPB Inhale 1 puff into the lungs 2 (two) times daily.  Marland Kitchen latanoprost (XALATAN) 0.005 % ophthalmic solution Place 1 drop into both eyes at bedtime.   . meclizine (ANTIVERT) 12.5 MG tablet TAKE 1 TABLET BY MOUTH 3 TIMES A DAY AS NEEDED FOR DIZZINESS  . meloxicam (MOBIC) 7.5 MG tablet TAKE 1 TABLET BY MOUTH EVERY DAY AS NEEDED FOR PAIN  . omeprazole (PRILOSEC) 40 MG capsule TAKE 1 CAPSULE BY MOUTH EVERY DAY (Patient taking differently: Take 40 mg by mouth daily. )  . oxybutynin (DITROPAN-XL) 10 MG 24 hr tablet TAKE 1 TABLET (10 MG TOTAL) BY MOUTH DAILY. (Patient taking differently: Take 10 mg by mouth daily. )  . psyllium (METAMUCIL) 0.52 g capsule Take 0.52 g by mouth in the morning and at bedtime.  . tamsulosin (FLOMAX) 0.4 MG CAPS capsule TAKE 1 CAPSULE BY MOUTH TWICE A DAY (Patient taking differently: Take 0.4 mg by mouth in the morning and at bedtime. )  . telmisartan (MICARDIS) 20 MG tablet TAKE 1 TABLET BY MOUTH EVERY DAY (Patient taking differently: Take 20 mg by mouth daily. )  . traMADol (ULTRAM) 50 MG  tablet TAKE 1 TABLET BY MOUTH EVERY 6 HOURS AS NEEDED FOR PAIN (Patient taking differently: Take 50 mg by mouth every 6 (six) hours as needed for moderate pain. )  . XARELTO 20 MG TABS tablet TAKE 1 TABLET (20 MG TOTAL) BY MOUTH DAILY WITH SUPPER. (Patient taking differently: Take 20 mg by mouth daily. )                Past Medical History:  Diagnosis Date  . Abdominal pain, epigastric   . Allergic rhinitis, cause unspecified   .  Arthritis   . BPH (benign prostatic hyperplasia)   . CHF (congestive heart failure) ([place])   . Chronic anticoagulation    xarelto  . Coronary artery calcification seen on CAT scan 12/26/2012  . Depressive disorder, not elsewhere classified   . Diverticulosis of colon (without mention of hemorrhage)   . Esophageal reflux   . High risk medication use   . Hyperlipidemia   . Hyperplastic rectal polyp   . Hypertension   . Lumbago   . Pacemaker   . Paralyzed hemidiaphragm    LEFT  . Persistent atrial fibrillation ([place])   . Sick sinus syndrome [facility]) July 2010   s/p PPM [facility]) by [name]       Objective:    amb somber  bm nad     10/21/2020       198  07/19/2020          189  05/28/20 191 lb 9.6 oz (86.9 kg)  05/07/20 190 lb (86.2 kg)  04/18/20 191 lb (86.6 kg)    Vital signs reviewed  10/21/2020  - Note at rest 02 sats  100% on RA       HEENT : pt wearing mask not removed for exam due to covid - 19 concerns.    NECK :  without JVD/Nodes/TM/ nl carotid upstrokes bilaterally   LUNGS: no acc muscle use,  Mild barrel  contour chest wall with bilateral  Distant bs s audible wheeze and  without cough on insp or exp maneuvers  and mild  Hyperresonant  to  Percussion x in L base where dull    CV:  RRR  no s3 or murmur or increase in P2, and no edema   ABD:  soft and nontender with pos end  insp Hoover's  in the supine position. No bruits or organomegaly appreciated, bowel sounds nl  MS:   Nl gait/  ext warm without deformities, calf  tenderness, cyanosis or clubbing No obvious joint restrictions   SKIN: warm and dry without lesions    NEURO:  alert, approp, nl sensorium with  no motor or cerebellar deficits apparent.               I personally reviewed images and agree with radiology impression as follows:  CXR:    10/15/20  Elevated left hemidiaphragm with left lower lobe pleuroparenchymal scarring unchanged. Right lung clear. No heart failure.             Assessment

## 2020-10-21 NOTE — Patient Instructions (Addendum)
Trelegy 641 trial one click each take 2 deep drags and fill the prescription if you think it's helping but needs to be approved by your eye doctor (the Rx is for the 100 as we had no 200 sampes)    Make sure you check your oxygen saturations at highest level of activity to be sure it stays over 90% and keep track of it at least once a week, more often if breathing getting worse, and let me know if losing ground.      Please schedule a follow up visit in 6 months but call sooner if needed

## 2020-10-22 ENCOUNTER — Telehealth: Payer: Self-pay | Admitting: Internal Medicine

## 2020-10-22 ENCOUNTER — Encounter: Payer: Self-pay | Admitting: Internal Medicine

## 2020-10-22 NOTE — Assessment & Plan Note (Signed)
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 > see sep a/p -  10/21/2020   Walked RA  3 laps @ approx 25ft each @ avg pace  stopped due to end of study,  sats 93%, sob on last lap but not to where had to stop  Clearly an issue of deconditioning present and cannot understand his c/o 50 ft sob when he did 750 ft at relatively fast pace here so rec reconditioning at submax intensity building up ideally   to 30 mn daily          Each maintenance medication was reviewed in detail including emphasizing most importantly the difference between maintenance and prns and under what circumstances the prns are to be triggered using an action plan format where appropriate.  Total time for H and P, chart review, counseling, teaching device  directly observing portions of ambulatory 02 saturation study/  and generating customized AVS unique to this office visit / charting = 31 min

## 2020-10-22 NOTE — Assessment & Plan Note (Signed)
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  - PFT's  07/19/2020  FEV1 1.54 (61 % ) ratio 0.76  p 46 % improvement from saba p 0 prior to study with DLCO  11.96(51%) corrects to  11.96(51%)  for alv volume and FV curve classic curvature  - 07/19/2020   try breo 200 sample then 100 daily maint  - 08/10/2020  Cannot afford breo on insurance so rec try wixella 250/50 one twice daily  - 10/21/2020  After extensive coaching inhaler device,  effectiveness =    90% with elipta so try trelegy 100 (200 sample) if approved by opth  - 10/21/2020 no desats walking   Group D in terms of symptom/risk and laba/lama/ICS  therefore appropriate rx at this point >>>  trelegy best option if paid for by insurance and ok by ophth  Advised:  formulary restrictions will be an ongoing challenge for the forseable future and I would be happy to pick an alternative if the pt will first  provide me a list of them -  pt  will need to return here for training for any new device that is required eg dpi vs hfa vs respimat.    In the meantime we can always provide samples so that the patient never runs out of any needed respiratory medications.

## 2020-10-22 NOTE — Telephone Encounter (Signed)
    1.Medication Requested: meclizine (ANTIVERT) 12.5 MG tablet  2. Pharmacy (Name, Street, City):[id number] - [place], [place] [address]  3. On Med List: yes   4. Last Visit with PCP: 05/07/20  5. Next visit date with PCP: n/a   Agent: Please be advised that RX refills may take up to 3 business days. We ask that you follow-up with your pharmacy.

## 2020-10-23 MED ORDER — MECLIZINE HCL 12.5 MG PO TABS
ORAL_TABLET | ORAL | 2 refills | Status: DC
Start: 2020-10-23 — End: 2021-03-31

## 2020-10-23 NOTE — Telephone Encounter (Signed)
Done erx 

## 2020-10-25 IMAGING — DX DG CHEST 2V
2 series · 2 of 2 positions shown · non-contrast
Comparison: 01/13/2020

CLINICAL DATA: Shortness of breath, dyspnea on exertion

EXAM:
CHEST - 2 VIEW

[chest pa]
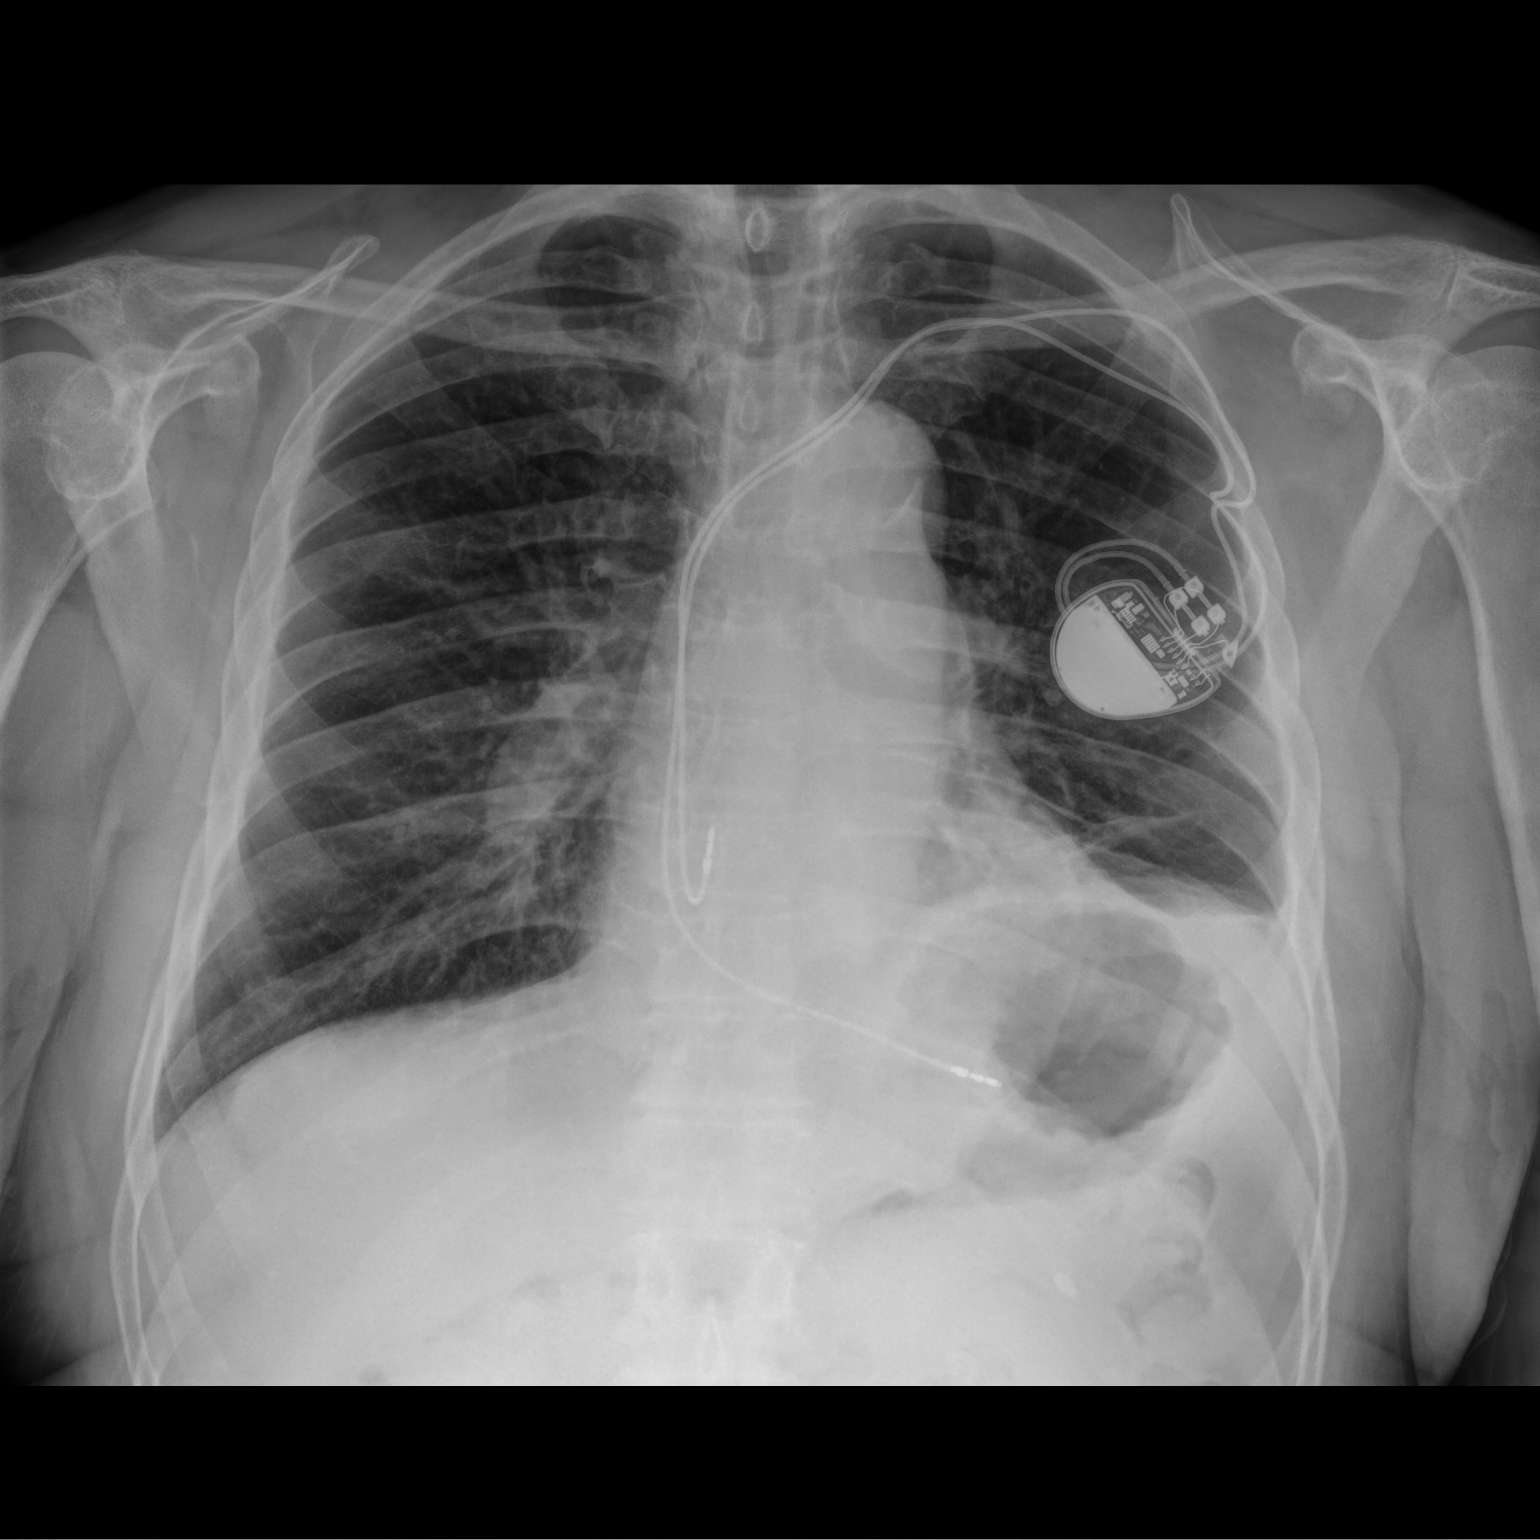

[chest lat]
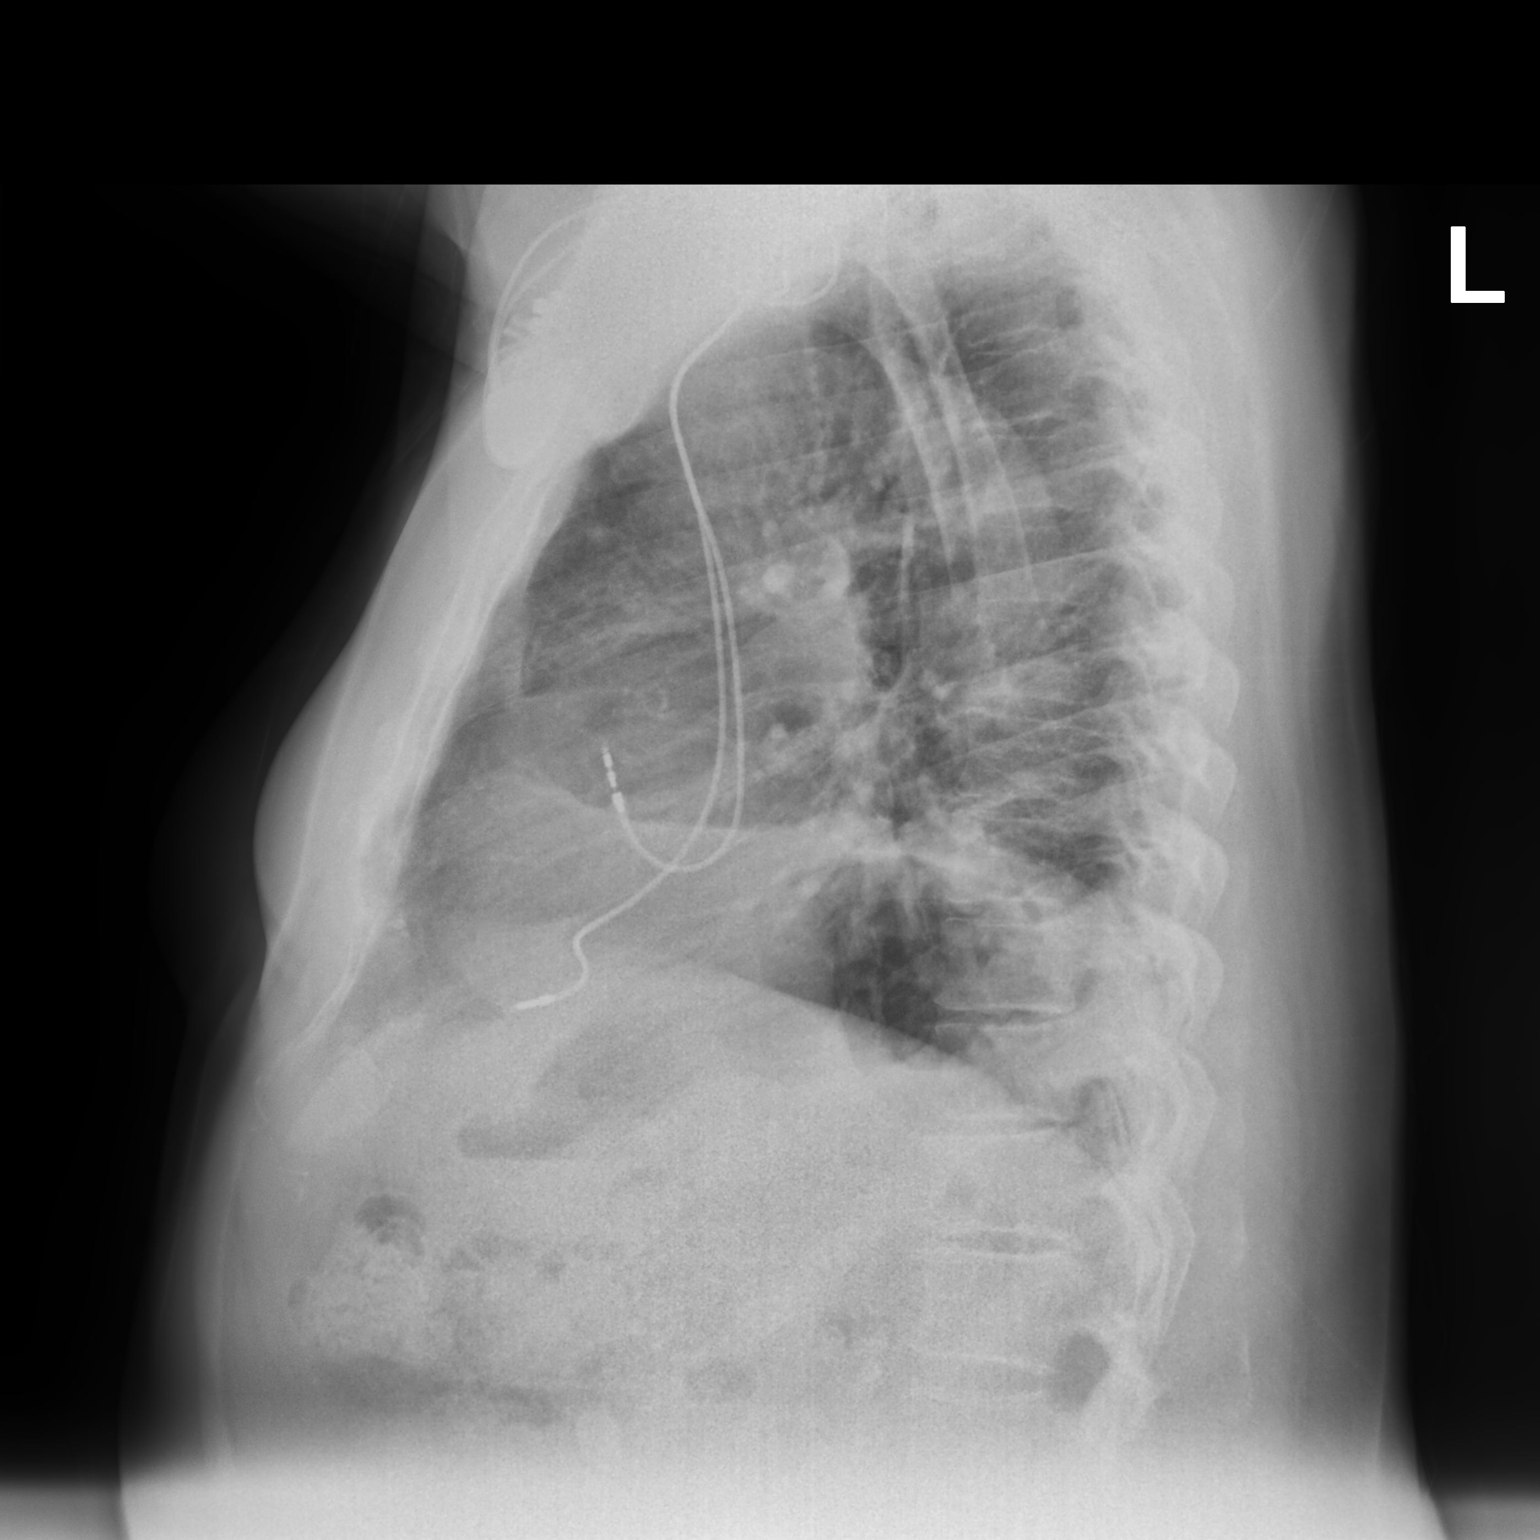

[2 of 2 positions shown; findings below may reference images not displayed]

FINDINGS: Similar left lower lobe scarring and volume loss with elevation of
the left hemidiaphragm. Stable aeration otherwise. No new acute
airspace process, collapse or consolidation. Normal heart size and
vascularity. Left subclavian 2 lead pacer noted. Aorta
atherosclerotic. Trachea midline.
IMPRESSION: Stable chest exam with chronic left lower lobe atelectasis/volume
loss and left hemidiaphragm elevation.

Aortic Atherosclerosis ([id number]).

## 2020-10-26 ENCOUNTER — Other Ambulatory Visit: Payer: Self-pay | Admitting: Internal Medicine

## 2020-10-26 NOTE — Telephone Encounter (Signed)
Please refill as per office routine med refill policy (all routine meds refilled for 3 mo or monthly per pt preference up to one year from last visit, then month to month grace period for 3 mo, then further med refills will have to be denied)  

## 2020-10-26 NOTE — Telephone Encounter (Signed)
Please refill as per office routine med refill policy (all routine meds refilled for 3 mo or monthly per pt preference up to one year from last visit, then month to month grace period for 3 mo, then further med refills will have to be denied)  All except flecainide

## 2020-10-29 ENCOUNTER — Ambulatory Visit (INDEPENDENT_AMBULATORY_CARE_PROVIDER_SITE_OTHER): Payer: Medicare Other | Admitting: Emergency Medicine

## 2020-10-29 ENCOUNTER — Other Ambulatory Visit: Payer: Self-pay

## 2020-10-29 DIAGNOSIS — Z950 Presence of cardiac pacemaker: Secondary | ICD-10-CM | POA: Diagnosis not present

## 2020-10-29 DIAGNOSIS — I495 Sick sinus syndrome: Secondary | ICD-10-CM

## 2020-10-29 LAB — CUP PACEART INCLINIC DEVICE CHECK
Date Time Interrogation Session: 20211214091548
Implantable Lead Implant Date: 20100719
Implantable Lead Implant Date: 20211130
Implantable Lead Location: 753859
Implantable Lead Location: 753860
Implantable Lead Model: 4136
Implantable Lead Model: 7842
Implantable Lead Serial Number: 1055910
Implantable Lead Serial Number: 28594836
Implantable Pulse Generator Implant Date: 20211130
Lead Channel Impedance Value: 634 Ohm
Lead Channel Impedance Value: 789 Ohm
Lead Channel Pacing Threshold Amplitude: 0.8 V
Lead Channel Pacing Threshold Amplitude: 1.2 V
Lead Channel Pacing Threshold Pulse Width: 0.4 ms
Lead Channel Pacing Threshold Pulse Width: 0.4 ms
Lead Channel Sensing Intrinsic Amplitude: 10.3 mV
Lead Channel Sensing Intrinsic Amplitude: 5.3 mV
Lead Channel Setting Pacing Amplitude: 2 V
Lead Channel Setting Pacing Amplitude: 3.5 V
Lead Channel Setting Pacing Pulse Width: 0.4 ms
Lead Channel Setting Sensing Sensitivity: 2.5 mV
Pulse Gen Serial Number: 951809

## 2020-10-29 NOTE — Progress Notes (Signed)
Wound check appointment. Steri-strips removed. Wound without redness or edema. Incision edges approximated, wound well healed. Normal device function. Thresholds, sensing, and impedances consistent with implant measurements. Device programmed in RV 3.5V/auto capture programmed on for extra safety margin until 3 month visit, RA programmed at chronic settings due to mature lead.Marland Kitchen Histogram distribution appropriate for patient and level of activity. # AMS episodes with EGMs that show AT , longest 58 seconds. No high ventricular rates noted. Patient educated about wound care, arm mobility, lifting restrictions. ROV with Dr Rayann Heman 01/15/21.

## 2020-10-31 DIAGNOSIS — H401131 Primary open-angle glaucoma, bilateral, mild stage: Secondary | ICD-10-CM | POA: Diagnosis not present

## 2020-11-05 ENCOUNTER — Other Ambulatory Visit: Payer: Self-pay

## 2020-11-05 ENCOUNTER — Telehealth: Payer: Self-pay | Admitting: Internal Medicine

## 2020-11-05 DIAGNOSIS — K219 Gastro-esophageal reflux disease without esophagitis: Secondary | ICD-10-CM

## 2020-11-05 MED ORDER — OMEPRAZOLE 40 MG PO CPDR: DELAYED_RELEASE_CAPSULE | ORAL | 2 refills | Status: DC

## 2020-11-05 NOTE — Telephone Encounter (Signed)
  Patient has no medication remaining   1.Medication Requested:omeprazole (PRILOSEC) 40 MG capsule  2. Pharmacy (Name, Street, City):[id number] - [place], [place] - [address]  3. On Med List: yes  4. Last Visit with PCP:04/2020  5. Next visit date with PCP: 11/18/20   Agent: Please be advised that RX refills may take up to 3 business days. We ask that you follow-up with your pharmacy.

## 2020-11-11 ENCOUNTER — Other Ambulatory Visit: Payer: Self-pay | Admitting: Internal Medicine

## 2020-11-18 ENCOUNTER — Encounter: Payer: Self-pay | Admitting: Internal Medicine

## 2020-11-18 ENCOUNTER — Ambulatory Visit (INDEPENDENT_AMBULATORY_CARE_PROVIDER_SITE_OTHER): Payer: Medicare Other | Admitting: Internal Medicine

## 2020-11-18 ENCOUNTER — Other Ambulatory Visit: Payer: Self-pay

## 2020-11-18 VITALS — BP 118/80 | HR 63 | Temp 98.4°F | Ht 69.0 in | Wt 193.0 lb

## 2020-11-18 DIAGNOSIS — N401 Enlarged prostate with lower urinary tract symptoms: Secondary | ICD-10-CM | POA: Diagnosis not present

## 2020-11-18 DIAGNOSIS — I10 Essential (primary) hypertension: Secondary | ICD-10-CM

## 2020-11-18 DIAGNOSIS — K219 Gastro-esophageal reflux disease without esophagitis: Secondary | ICD-10-CM

## 2020-11-18 DIAGNOSIS — G8929 Other chronic pain: Secondary | ICD-10-CM | POA: Diagnosis not present

## 2020-11-18 DIAGNOSIS — R3914 Feeling of incomplete bladder emptying: Secondary | ICD-10-CM

## 2020-11-18 DIAGNOSIS — R739 Hyperglycemia, unspecified: Secondary | ICD-10-CM | POA: Diagnosis not present

## 2020-11-18 MED ORDER — OMEPRAZOLE 40 MG PO CPDR: DELAYED_RELEASE_CAPSULE | ORAL | 3 refills | Status: DC

## 2020-11-18 NOTE — Progress Notes (Signed)
Established Patient Office Visit  Subjective:  Patient ID: [name], male    DOB: 03/11/1941  Age: 80 y.o. MRN: [id number]      Chief Complaint: (concise statement describing the symptom, problem, condition, diagnosis, physician recommended return, or other factor as reason for encounter) follow up HTN, HLD and hyperglycemia , BPH, chronic pain, GERD       HPI:  [name] is a 80 y.o. male here to f/u; overall doing ok but has worsening nocturia recently now up to 5 times per night, but stable urinary stream, start and stop, without dribbling.  Denies urinary symptoms such as dysuria, frequency, urgency, flank pain, hematuria or n/v, fever, chills.  Pt denies chest pain, increased sob or doe, wheezing, orthopnea, PND, increased LE swelling, palpitations, dizziness or syncope.  Denies worsening reflux, abd pain, dysphagia, n/v, bowel change or blood.  Also has vertigo mild recurring, better with meclizine but has run out.  Pt denies new neurological symptoms such as new headache, or facial or extremity weakness or numbness .        Wt Readings from Last 3 Encounters:  11/18/20 193 lb (87.5 kg)  10/21/20 198 lb (89.8 kg)  10/15/20 187 lb (84.8 kg)   BP Readings from Last 3 Encounters:  11/18/20 118/80  10/21/20 130/88  10/15/20 (!) 148/99    Past Medical History:  Diagnosis Date  . Abdominal pain, epigastric   . Allergic rhinitis, cause unspecified   . Arthritis   . BPH (benign prostatic hyperplasia)   . CHF (congestive heart failure) ([place])   . Chronic anticoagulation    xarelto  . Coronary artery calcification seen on CAT scan 12/26/2012  . Depressive disorder, not elsewhere classified   . Diverticulosis of colon (without mention of hemorrhage)   . Esophageal reflux   . High risk medication use   . Hyperlipidemia   . Hyperplastic rectal polyp   . Hypertension   . Lumbago   . Pacemaker   . Paralyzed hemidiaphragm    LEFT  . Persistent atrial fibrillation ([place])   . Sick  sinus syndrome [facility]) July 2010   s/p PPM [facility]) by [name]   Past Surgical History:  Procedure Laterality Date  . ESOPHAGOGASTRODUODENOSCOPY N/A 12/05/2013   Procedure: ESOPHAGOGASTRODUODENOSCOPY (EGD);  Surgeon: [name], MD;  Location: [facility];  Service: Endoscopy;  Laterality: N/A;  . ESOPHAGOGASTRODUODENOSCOPY N/A 07/04/2015   Procedure: ESOPHAGOGASTRODUODENOSCOPY (EGD);  Surgeon: [name], MD;  Location: [place];  Service: Endoscopy;  Laterality: N/A;  . INSERT / REPLACE / REMOVE PACEMAKER  05/2009    [employer] ALTRUA 60  (serial number [id number]) dual-chamber pacemaker  . LEAD REVISION/REPAIR N/A 10/15/2020   Procedure: LEAD REVISION/REPAIR;  Surgeon: Thompson Grayer, MD;  Location: [place] [facility];  Service: Cardiovascular;  Laterality: N/A;  . PACEMAKER PLACEMENT Bilateral    '05  . PPM GENERATOR CHANGEOUT N/A 10/15/2020   Procedure: PPM GENERATOR CHANGEOUT;  Surgeon: Thompson Grayer, MD;  Location: [place] [facility];  Service: Cardiovascular;  Laterality: N/A;    reports that he quit smoking about 29 years ago. His smoking use included cigarettes. He has a 10.00 pack-year smoking history. He has quit using smokeless tobacco.  His smokeless tobacco use included chew. He reports that he does not drink alcohol and does not use drugs. family history includes Arthritis in his paternal grandmother; Asthma in his sister; Cancer in his father; Diabetes in his mother; Hypertension in his brother, daughter,  father, mother, sister, and son; Lung cancer in his father. No Known Allergies Current Outpatient Medications on File Prior to Visit  Medication Sig Dispense Refill  . acetaminophen (TYLENOL) 650 MG CR tablet Take 1,300 mg by mouth in the morning and at bedtime.    Marland Kitchen ADVAIR DISKUS 250-50 MCG/DOSE AEPB Inhale 1 puff into the lungs 2 (two) times daily.    Marland Kitchen amLODipine (NORVASC) 5 MG tablet TAKE 1 TABLET BY MOUTH EVERY DAY 90 tablet 1  . atorvastatin  (LIPITOR) 80 MG tablet TAKE 1 TABLET BY MOUTH EVERY DAY 90 tablet 3  . Cholecalciferol (VITAMIN D3) 50 MCG (2000 UT) TABS Take 2,000 Units by mouth daily.    . clobetasol cream (TEMOVATE) 0.05 % APPLY TO AFFECTED AREA TWICE A DAY 30 g 2  . dorzolamide-timolol (COSOPT) 22.3-6.8 MG/ML ophthalmic solution Place 2 drops into both eyes in the morning and at bedtime.    . fexofenadine (ALLEGRA) 180 MG tablet TAKE 1 TABLET (180 MG TOTAL) BY MOUTH DAILY. (Patient taking differently: Take 180 mg by mouth daily.) 90 tablet 1  . Fluticasone-Umeclidin-Vilant (TRELEGY ELLIPTA) 100-62.5-25 MCG/INH AEPB Take one click each am 1 each 11  . latanoprost (XALATAN) 0.005 % ophthalmic solution Place 1 drop into both eyes at bedtime.     . meclizine (ANTIVERT) 12.5 MG tablet TAKE 1 TABLET BY MOUTH 3 TIMES A DAY AS NEEDED FOR DIZZINESS 30 tablet 2  . oxybutynin (DITROPAN-XL) 10 MG 24 hr tablet TAKE 1 TABLET (10 MG TOTAL) BY MOUTH DAILY. (Patient taking differently: Take 10 mg by mouth daily.) 90 tablet 1  . psyllium (METAMUCIL) 0.52 g capsule Take 0.52 g by mouth in the morning and at bedtime.    . tamsulosin (FLOMAX) 0.4 MG CAPS capsule TAKE 1 CAPSULE BY MOUTH TWICE A DAY 180 capsule 2  . telmisartan (MICARDIS) 20 MG tablet TAKE 1 TABLET BY MOUTH EVERY DAY 90 tablet 3  . traMADol (ULTRAM) 50 MG tablet TAKE 1 TABLET BY MOUTH EVERY 6 HOURS AS NEEDED FOR PAIN (Patient taking differently: Take 50 mg by mouth every 6 (six) hours as needed for moderate pain.) 120 tablet 2  . XARELTO 20 MG TABS tablet TAKE 1 TABLET (20 MG TOTAL) BY MOUTH DAILY WITH SUPPER. (Patient taking differently: Take 20 mg by mouth daily.) 90 tablet 2   No current facility-administered medications on file prior to visit.        ROS:  All others reviewed and negative.  Objective        PE:  BP 118/80 (BP Location: Left Arm, Patient Position: Sitting, Cuff Size: Large)   Pulse 63   Temp 98.4 F (36.9 C) (Oral)   Ht 5\' 9"  (1.753 m)   Wt 193 lb (87.5  kg)   SpO2 95%   BMI 28.50 kg/m                 Constitutional: Pt appears in NAD               HENT: Head: NCAT.                Right Ear: External ear normal.                 Left Ear: External ear normal.                Eyes: . Pupils are equal, round, and reactive to light. Conjunctivae and EOM are normal  Nose: without d/c or deformity               Neck: Neck supple. Gross normal ROM               Cardiovascular: Normal rate and regular rhythm.                 Pulmonary/Chest: Effort normal and breath sounds without rales or wheezing.                Abd:  Soft, NT, ND, + BS, no organomegaly               Neurological: Pt is alert. At baseline orientation, motor grossly intact               Skin: Skin is warm. No rashes, no other new lesions, LE edema - none               Psychiatric: Pt behavior is normal without agitation   Assessment/Plan:  AHREN ROGERS is a 80 y.o. Black or African American [2] male with  has a past medical history of Abdominal pain, epigastric, Allergic rhinitis, cause unspecified, Arthritis, BPH (benign prostatic hyperplasia), CHF (congestive heart failure) ([place]), Chronic anticoagulation, Coronary artery calcification seen on CAT scan (12/26/2012), Depressive disorder, not elsewhere classified, Diverticulosis of colon (without mention of hemorrhage), Esophageal reflux, High risk medication use, Hyperlipidemia, Hyperplastic rectal polyp, Hypertension, Lumbago, Pacemaker, Paralyzed hemidiaphragm, Persistent atrial fibrillation ([place]), and Sick sinus syndrome [facility]) (July 2010).   Assessment Plan  See problem oriented assessment and plan Labs reviewed for each problem: Lab Results  Component Value Date   WBC 7.5 09/18/2020   HGB 14.7 09/18/2020   HCT 42.9 09/18/2020   PLT 195 09/18/2020   GLUCOSE 91 09/18/2020   CHOL 185 11/07/2019   TRIG 59.0 11/07/2019   HDL 75.00 11/07/2019   LDLDIRECT 183.4 12/26/2013   LDLCALC 98 11/07/2019   ALT 41  01/13/2020   AST 29 01/13/2020   NA 147 (H) 09/18/2020   K 4.3 09/18/2020   CL 107 (H) 09/18/2020   CREATININE 0.95 09/18/2020   BUN 12 09/18/2020   CO2 24 09/18/2020   TSH 0.86 05/28/2020   PSA 1.76 11/07/2019   INR 1.45 07/01/2015   HGBA1C 6.2 11/07/2019    Micro: none  Cardiac tracings I have personally interpreted today:  none  Pertinent Radiological findings (summarize): none      There are no preventive care reminders to display for this patient.  There are no preventive care reminders to display for this patient.  Lab Results  Component Value Date   TSH 0.86 05/28/2020   Lab Results  Component Value Date   WBC 7.5 09/18/2020   HGB 14.7 09/18/2020   HCT 42.9 09/18/2020   MCV 92 09/18/2020   PLT 195 09/18/2020   Lab Results  Component Value Date   NA 147 (H) 09/18/2020   K 4.3 09/18/2020   CO2 24 09/18/2020   GLUCOSE 91 09/18/2020   BUN 12 09/18/2020   CREATININE 0.95 09/18/2020   BILITOT 0.9 01/13/2020   ALKPHOS 63 01/13/2020   AST 29 01/13/2020   ALT 41 01/13/2020   PROT 7.5 01/13/2020   ALBUMIN 4.2 01/13/2020   CALCIUM 9.3 09/18/2020   ANIONGAP 11 01/13/2020   GFR 110.20 05/28/2020   Lab Results  Component Value Date   CHOL 185 11/07/2019   Lab Results  Component Value Date   HDL 75.00 11/07/2019   Lab  Results  Component Value Date   LDLCALC 98 11/07/2019   Lab Results  Component Value Date   TRIG 59.0 11/07/2019   Lab Results  Component Value Date   CHOLHDL 2 11/07/2019   Lab Results  Component Value Date   HGBA1C 6.2 11/07/2019      Assessment & Plan:   Problem List Items Addressed This Visit      High   Chronic pain    Stable, cont tramadol prn        Medium   Hyperglycemia    Lab Results  Component Value Date   HGBA1C 6.2 11/07/2019   Stable, cont diet and wt control      GERD    Stable, cont prilosec 40      Relevant Medications   omeprazole (PRILOSEC) 40 MG capsule   Essential hypertension    BP  Readings from Last 3 Encounters:  11/18/20 118/80  10/21/20 130/88  10/15/20 (!) 148/99  stable, cont amlodipine, micardis no change      BPH (benign prostatic hyperplasia) - Primary    With worsening nocturia, for urology referral      Relevant Orders   Ambulatory referral to Urology      Meds ordered this encounter  Medications  . omeprazole (PRILOSEC) 40 MG capsule    Sig: Take 1 capsule by mouth daily    Dispense:  90 capsule    Refill:  3    Follow-up: Return in about 6 months (around 05/18/2021).   Oliver Barre, MD 11/18/2020 11:05 AM [facility]

## 2020-11-18 NOTE — Patient Instructions (Signed)
Ok to take the prilosec every day  Please continue all other medications as before, and refills have been done if requested.  Please have the pharmacy call with any other refills you may need.  Please continue your efforts at being more active, low cholesterol diet, and weight control.  Please keep your appointments with your specialists as you may have planned  You will be contacted regarding the referral for: urology  Please make an Appointment to return in 6 months, or sooner if needed

## 2020-11-25 ENCOUNTER — Encounter: Payer: Self-pay | Admitting: Internal Medicine

## 2020-11-25 NOTE — Assessment & Plan Note (Signed)
Stable, cont prilosec 40

## 2020-11-25 NOTE — Assessment & Plan Note (Signed)
With worsening nocturia, for urology referral

## 2020-11-25 NOTE — Assessment & Plan Note (Signed)
Stable, cont tramadol prn

## 2020-11-25 NOTE — Assessment & Plan Note (Signed)
Lab Results  Component Value Date   HGBA1C 6.2 11/07/2019   Stable, cont diet and wt control

## 2020-11-25 NOTE — Assessment & Plan Note (Addendum)
BP Readings from Last 3 Encounters:  11/18/20 118/80  10/21/20 130/88  10/15/20 (!) 148/99  stable, cont amlodipine, micardis no change

## 2020-12-09 ENCOUNTER — Other Ambulatory Visit: Payer: Self-pay

## 2020-12-09 ENCOUNTER — Encounter: Payer: Self-pay | Admitting: Internal Medicine

## 2020-12-09 ENCOUNTER — Other Ambulatory Visit: Payer: Self-pay | Admitting: Internal Medicine

## 2020-12-09 ENCOUNTER — Ambulatory Visit (INDEPENDENT_AMBULATORY_CARE_PROVIDER_SITE_OTHER): Payer: Medicare Other | Admitting: Internal Medicine

## 2020-12-09 VITALS — BP 132/90 | HR 61 | Temp 99.0°F | Ht 69.0 in | Wt 193.0 lb

## 2020-12-09 DIAGNOSIS — R350 Frequency of micturition: Secondary | ICD-10-CM

## 2020-12-09 DIAGNOSIS — R310 Gross hematuria: Secondary | ICD-10-CM

## 2020-12-09 DIAGNOSIS — Z7901 Long term (current) use of anticoagulants: Secondary | ICD-10-CM

## 2020-12-09 DIAGNOSIS — R739 Hyperglycemia, unspecified: Secondary | ICD-10-CM

## 2020-12-09 LAB — CBC WITH DIFFERENTIAL/PLATELET
Basophils Absolute: 0 10*3/uL (ref 0.0–0.1)
Basophils Relative: 0.3 % (ref 0.0–3.0)
Eosinophils Absolute: 0 10*3/uL (ref 0.0–0.7)
Eosinophils Relative: 0.6 % (ref 0.0–5.0)
HCT: 46.9 % (ref 39.0–52.0)
Hemoglobin: 15.3 g/dL (ref 13.0–17.0)
Lymphocytes Relative: 26.3 % (ref 12.0–46.0)
Lymphs Abs: 2 10*3/uL (ref 0.7–4.0)
MCHC: 32.6 g/dL (ref 30.0–36.0)
MCV: 92.6 fl (ref 78.0–100.0)
Monocytes Absolute: 0.9 10*3/uL (ref 0.1–1.0)
Monocytes Relative: 11.4 % (ref 3.0–12.0)
Neutro Abs: 4.7 10*3/uL (ref 1.4–7.7)
Neutrophils Relative %: 61.4 % (ref 43.0–77.0)
Platelets: 183 10*3/uL (ref 150.0–400.0)
RBC: 5.06 Mil/uL (ref 4.22–5.81)
RDW: 13.6 % (ref 11.5–15.5)
WBC: 7.6 10*3/uL (ref 4.0–10.5)

## 2020-12-09 LAB — PROTIME-INR
INR: 2.2 ratio — ABNORMAL HIGH (ref 0.8–1.0)
Prothrombin Time: 24.4 s — ABNORMAL HIGH (ref 9.6–13.1)

## 2020-12-09 LAB — URINALYSIS, ROUTINE W REFLEX MICROSCOPIC
Bilirubin Urine: NEGATIVE
Ketones, ur: NEGATIVE
Leukocytes,Ua: NEGATIVE
Nitrite: NEGATIVE
Specific Gravity, Urine: 1.02 (ref 1.000–1.030)
Total Protein, Urine: NEGATIVE
Urine Glucose: NEGATIVE
Urobilinogen, UA: 0.2 (ref 0.0–1.0)
pH: 6 (ref 5.0–8.0)

## 2020-12-09 LAB — BASIC METABOLIC PANEL
BUN: 17 mg/dL (ref 6–23)
CO2: 27 mEq/L (ref 19–32)
Calcium: 10 mg/dL (ref 8.4–10.5)
Chloride: 102 mEq/L (ref 96–112)
Creatinine, Ser: 0.85 mg/dL (ref 0.40–1.50)
GFR: 83.73 mL/min (ref >60.00)
Glucose, Bld: 90 mg/dL (ref 70–99)
Potassium: 3.9 mEq/L (ref 3.5–5.1)
Sodium: 138 mEq/L (ref 135–145)

## 2020-12-09 NOTE — Patient Instructions (Signed)
OK to HOLD on taking the xarelto for at least 2 days to see if the bleeding clears up  Please continue all other medications as before, and refills have been done if requested.  Please have the pharmacy call with any other refills you may need.  Please keep your appointments with your specialists as you may have planned  Please go to the LAB at the blood drawing area for the tests to be done  You will be contacted by phone if any changes need to be made immediately.  Otherwise, you will receive a letter about your results with an explanation, but please check with MyChart first.  Please remember to sign up for MyChart if you have not done so, as this will be important to you in the future with finding out test results, communicating by private email, and scheduling acute appointments online when needed.

## 2020-12-09 NOTE — Progress Notes (Signed)
Established Patient Office Visit  Subjective:  Patient ID: [name], male    DOB: 1941-11-08  Age: 80 y.o. MRN: [id number]      Chief Complaint: blood in urine       HPI:  [name] is a 80 y.o. male here with c/o 2 days onset urinary frequency and blood while on xareto, but Denies urinary symptoms such as dysuria, urgency, flank pain, or n/v, fever, chills. Incidentally has an appt with urology feb 9 for BPH.  Pt denies chest pain, increased sob or doe, wheezing, orthopnea, PND, increased LE swelling, palpitations, dizziness or syncope.  Pt denies new neurological symptoms such as new headache, or facial or extremity weakness or numbness   Pt denies polydipsia, polyuria .        Wt Readings from Last 3 Encounters:  12/09/20 193 lb (87.5 kg)  11/18/20 193 lb (87.5 kg)  10/21/20 198 lb (89.8 kg)   BP Readings from Last 3 Encounters:  12/09/20 132/90  11/18/20 118/80  10/21/20 130/88         Past Medical History:  Diagnosis Date  . Abdominal pain, epigastric   . Allergic rhinitis, cause unspecified   . Arthritis   . BPH (benign prostatic hyperplasia)   . CHF (congestive heart failure) ([place])   . Chronic anticoagulation    xarelto  . Coronary artery calcification seen on CAT scan 12/26/2012  . Depressive disorder, not elsewhere classified   . Diverticulosis of colon (without mention of hemorrhage)   . Esophageal reflux   . High risk medication use   . Hyperlipidemia   . Hyperplastic rectal polyp   . Hypertension   . Lumbago   . Pacemaker   . Paralyzed hemidiaphragm    LEFT  . Persistent atrial fibrillation ([place])   . Sick sinus syndrome [facility]) July 2010   s/p PPM [facility]) by [name]   Past Surgical History:  Procedure Laterality Date  . ESOPHAGOGASTRODUODENOSCOPY N/A 12/05/2013   Procedure: ESOPHAGOGASTRODUODENOSCOPY (EGD);  Surgeon: [name], MD;  Location[facility];  Service: Endoscopy;  Laterality: N/A;  . ESOPHAGOGASTRODUODENOSCOPY N/A 07/04/2015    Procedure: ESOPHAGOGASTRODUODENOSCOPY (EGD);  Surgeon: [name], MD;  Location: [place];  Service: Endoscopy;  Laterality: N/A;  . INSERT / REPLACE / REMOVE PACEMAKER  05/2009    [employer] ALTRUA 60  (serial number [id number]) dual-chamber pacemaker  . LEAD REVISION/REPAIR N/A 10/15/2020   Procedure: LEAD REVISION/REPAIR;  Surgeon: Thompson Grayer, MD;  Location: [place] [facility];  Service: Cardiovascular;  Laterality: N/A;  . PACEMAKER PLACEMENT Bilateral    '05  . PPM GENERATOR CHANGEOUT N/A 10/15/2020   Procedure: PPM GENERATOR CHANGEOUT;  Surgeon: Thompson Grayer, MD;  Location: [place] [facility];  Service: Cardiovascular;  Laterality: N/A;    reports that he quit smoking about 29 years ago. His smoking use included cigarettes. He has a 10.00 pack-year smoking history. He has quit using smokeless tobacco.  His smokeless tobacco use included chew. He reports that he does not drink alcohol and does not use drugs. family history includes Arthritis in his paternal grandmother; Asthma in his sister; Cancer in his father; Diabetes in his mother; Hypertension in his brother, daughter, father, mother, sister, and son; Lung cancer in his father. No Known Allergies Current Outpatient Medications on File Prior to Visit  Medication Sig Dispense Refill  . acetaminophen (TYLENOL) 650 MG CR tablet Take 1,300 mg by mouth in the morning and at bedtime.    Marland Kitchen  ADVAIR DISKUS 250-50 MCG/DOSE AEPB Inhale 1 puff into the lungs 2 (two) times daily.    Marland Kitchen amLODipine (NORVASC) 5 MG tablet TAKE 1 TABLET BY MOUTH EVERY DAY 90 tablet 1  . atorvastatin (LIPITOR) 80 MG tablet TAKE 1 TABLET BY MOUTH EVERY DAY 90 tablet 3  . Cholecalciferol (VITAMIN D3) 50 MCG (2000 UT) TABS Take 2,000 Units by mouth daily.    . clobetasol cream (TEMOVATE) 0.05 % APPLY TO AFFECTED AREA TWICE A DAY 30 g 2  . dorzolamide-timolol (COSOPT) 22.3-6.8 MG/ML ophthalmic solution Place 2 drops into both eyes in the morning and at  bedtime.    . fexofenadine (ALLEGRA) 180 MG tablet TAKE 1 TABLET (180 MG TOTAL) BY MOUTH DAILY. (Patient taking differently: Take 180 mg by mouth daily.) 90 tablet 1  . Fluticasone-Umeclidin-Vilant (TRELEGY ELLIPTA) 100-62.5-25 MCG/INH AEPB Take one click each am 1 each 11  . latanoprost (XALATAN) 0.005 % ophthalmic solution Place 1 drop into both eyes at bedtime.     . meclizine (ANTIVERT) 12.5 MG tablet TAKE 1 TABLET BY MOUTH 3 TIMES A DAY AS NEEDED FOR DIZZINESS 30 tablet 2  . omeprazole (PRILOSEC) 40 MG capsule Take 1 capsule by mouth daily 90 capsule 3  . oxybutynin (DITROPAN-XL) 10 MG 24 hr tablet TAKE 1 TABLET (10 MG TOTAL) BY MOUTH DAILY. (Patient taking differently: Take 10 mg by mouth daily.) 90 tablet 1  . psyllium (METAMUCIL) 0.52 g capsule Take 0.52 g by mouth in the morning and at bedtime.    . tamsulosin (FLOMAX) 0.4 MG CAPS capsule TAKE 1 CAPSULE BY MOUTH TWICE A DAY 180 capsule 2  . telmisartan (MICARDIS) 20 MG tablet TAKE 1 TABLET BY MOUTH EVERY DAY 90 tablet 3  . traMADol (ULTRAM) 50 MG tablet TAKE 1 TABLET BY MOUTH EVERY 6 HOURS AS NEEDED FOR PAIN (Patient taking differently: Take 50 mg by mouth every 6 (six) hours as needed for moderate pain.) 120 tablet 2  . XARELTO 20 MG TABS tablet TAKE 1 TABLET (20 MG TOTAL) BY MOUTH DAILY WITH SUPPER. (Patient taking differently: Take 20 mg by mouth daily.) 90 tablet 2   No current facility-administered medications on file prior to visit.        ROS:  All others reviewed and negative.  Objective        PE:  BP 132/90   Pulse 61   Temp 99 F (37.2 C) (Temporal)   Ht 5\' 9"  (1.753 m)   Wt 193 lb (87.5 kg)   SpO2 96%   BMI 28.50 kg/m                 Constitutional: Pt appears in NAD               HENT: Head: NCAT.                Right Ear: External ear normal.                 Left Ear: External ear normal.                Eyes: . Pupils are equal, round, and reactive to light. Conjunctivae and EOM are normal               Nose:  without d/c or deformity               Neck: Neck supple. Gross normal ROM  Cardiovascular: Normal rate and regular rhythm.                 Pulmonary/Chest: Effort normal and breath sounds without rales or wheezing.                Abd:  Soft, NT, ND, + BS, no organomegaly               Neurological: Pt is alert. At baseline orientation, motor grossly intact               Skin: Skin is warm. No rashes, no other new lesions, LE edema - none               Psychiatric: Pt behavior is normal without agitation   Assessment/Plan:  WEBSTER LIERA is a 80 y.o. Black or African American [2] male with  has a past medical history of Abdominal pain, epigastric, Allergic rhinitis, cause unspecified, Arthritis, BPH (benign prostatic hyperplasia), CHF (congestive heart failure) ([place]), Chronic anticoagulation, Coronary artery calcification seen on CAT scan (12/26/2012), Depressive disorder, not elsewhere classified, Diverticulosis of colon (without mention of hemorrhage), Esophageal reflux, High risk medication use, Hyperlipidemia, Hyperplastic rectal polyp, Hypertension, Lumbago, Pacemaker, Paralyzed hemidiaphragm, Persistent atrial fibrillation ([place]), and Sick sinus syndrome [facility]) (July 2010).   Micro: none  Cardiac tracings I have personally interpreted today:  none  Pertinent Radiological findings (summarize): none   Lab Results  Component Value Date   WBC 7.6 12/09/2020   HGB 15.3 12/09/2020   HCT 46.9 12/09/2020   PLT 183.0 12/09/2020   GLUCOSE 90 12/09/2020   CHOL 185 11/07/2019   TRIG 59.0 11/07/2019   HDL 75.00 11/07/2019   LDLDIRECT 183.4 12/26/2013   LDLCALC 98 11/07/2019   ALT 41 01/13/2020   AST 29 01/13/2020   NA 138 12/09/2020   K 3.9 12/09/2020   CL 102 12/09/2020   CREATININE 0.85 12/09/2020   BUN 17 12/09/2020   CO2 27 12/09/2020   TSH 0.86 05/28/2020   PSA 1.76 11/07/2019   INR 2.2 (H) 12/09/2020   HGBA1C 6.2 11/07/2019     Assessment & Plan:   Problem List  Items Addressed This Visit      Medium   Hyperglycemia    Lab Results  Component Value Date   HGBA1C 6.2 11/07/2019   Stable, pt to continue current medical treatment  - diet   Current Outpatient Medications (Cardiovascular):  .  amLODipine (NORVASC) 5 MG tablet, TAKE 1 TABLET BY MOUTH EVERY DAY .  atorvastatin (LIPITOR) 80 MG tablet, TAKE 1 TABLET BY MOUTH EVERY DAY .  telmisartan (MICARDIS) 20 MG tablet, TAKE 1 TABLET BY MOUTH EVERY DAY  Current Outpatient Medications (Respiratory):  Marland Kitchen  ADVAIR DISKUS 250-50 MCG/DOSE AEPB, Inhale 1 puff into the lungs 2 (two) times daily. .  fexofenadine (ALLEGRA) 180 MG tablet, TAKE 1 TABLET (180 MG TOTAL) BY MOUTH DAILY. (Patient taking differently: Take 180 mg by mouth daily.) .  Fluticasone-Umeclidin-Vilant (TRELEGY ELLIPTA) 100-62.5-25 MCG/INH AEPB, Take one click each am  Current Outpatient Medications (Analgesics):  .  acetaminophen (TYLENOL) 650 MG CR tablet, Take 1,300 mg by mouth in the morning and at bedtime. .  traMADol (ULTRAM) 50 MG tablet, TAKE 1 TABLET BY MOUTH EVERY 6 HOURS AS NEEDED FOR PAIN (Patient taking differently: Take 50 mg by mouth every 6 (six) hours as needed for moderate pain.)  Current Outpatient Medications (Hematological):  Marland Kitchen  XARELTO 20 MG TABS tablet, TAKE 1 TABLET (20 MG TOTAL) BY  MOUTH DAILY WITH SUPPER. (Patient taking differently: Take 20 mg by mouth daily.)  Current Outpatient Medications (Other):  Marland Kitchen  Cholecalciferol (VITAMIN D3) 50 MCG (2000 UT) TABS, Take 2,000 Units by mouth daily. .  clobetasol cream (TEMOVATE) 0.05 %, APPLY TO AFFECTED AREA TWICE A DAY .  dorzolamide-timolol (COSOPT) 22.3-6.8 MG/ML ophthalmic solution, Place 2 drops into both eyes in the morning and at bedtime. Marland Kitchen  latanoprost (XALATAN) 0.005 % ophthalmic solution, Place 1 drop into both eyes at bedtime.  .  meclizine (ANTIVERT) 12.5 MG tablet, TAKE 1 TABLET BY MOUTH 3 TIMES A DAY AS NEEDED FOR DIZZINESS .  omeprazole (PRILOSEC) 40 MG  capsule, Take 1 capsule by mouth daily .  oxybutynin (DITROPAN-XL) 10 MG 24 hr tablet, TAKE 1 TABLET (10 MG TOTAL) BY MOUTH DAILY. (Patient taking differently: Take 10 mg by mouth daily.) .  psyllium (METAMUCIL) 0.52 g capsule, Take 0.52 g by mouth in the morning and at bedtime. .  tamsulosin (FLOMAX) 0.4 MG CAPS capsule, TAKE 1 CAPSULE BY MOUTH TWICE A DAY       Gross hematuria    For UA, and may need CT abd/pelvis if urine culture is negative,  to f/u any worsening symptoms or concerns       Relevant Orders   CBC with Differential/Platelet (Completed)   Basic metabolic panel (Completed)   Urinalysis, Routine w reflex microscopic (Completed)   Urine Culture (Completed)   Protime-INR (Completed)   FREQUENCY, URINARY - Primary    Cant r/o uti - for urine culture,  to f/u any worsening symptoms or concerns      Relevant Orders   CBC with Differential/Platelet (Completed)   Basic metabolic panel (Completed)   Urinalysis, Routine w reflex microscopic (Completed)   Urine Culture (Completed)   Protime-INR (Completed)   Chronic anticoagulation    Ok to hold xarelto for 2 days and may need longer if persistent bleeding      Relevant Orders   CBC with Differential/Platelet (Completed)   Basic metabolic panel (Completed)   Urinalysis, Routine w reflex microscopic (Completed)   Urine Culture (Completed)   Protime-INR (Completed)      No orders of the defined types were placed in this encounter.   Follow-up: Return if symptoms worsen or fail to improve.    Cathlean Cower, MD 12/15/2020 5:48 PM [facility]

## 2020-12-10 LAB — URINE CULTURE: Result:: NO GROWTH

## 2020-12-11 ENCOUNTER — Encounter: Payer: Self-pay | Admitting: Internal Medicine

## 2020-12-15 ENCOUNTER — Encounter: Payer: Self-pay | Admitting: Internal Medicine

## 2020-12-15 NOTE — Assessment & Plan Note (Signed)
For UA, and may need CT abd/pelvis if urine culture is negative,  to f/u any worsening symptoms or concerns

## 2020-12-15 NOTE — Assessment & Plan Note (Signed)
Cant r/o uti  - for urine culture,  to f/u any worsening symptoms or concerns 

## 2020-12-15 NOTE — Assessment & Plan Note (Signed)
Lab Results  Component Value Date   HGBA1C 6.2 11/07/2019   Stable, pt to continue current medical treatment  - diet   Current Outpatient Medications (Cardiovascular):  .  amLODipine (NORVASC) 5 MG tablet, TAKE 1 TABLET BY MOUTH EVERY DAY .  atorvastatin (LIPITOR) 80 MG tablet, TAKE 1 TABLET BY MOUTH EVERY DAY .  telmisartan (MICARDIS) 20 MG tablet, TAKE 1 TABLET BY MOUTH EVERY DAY  Current Outpatient Medications (Respiratory):  Marland Kitchen  ADVAIR DISKUS 250-50 MCG/DOSE AEPB, Inhale 1 puff into the lungs 2 (two) times daily. .  fexofenadine (ALLEGRA) 180 MG tablet, TAKE 1 TABLET (180 MG TOTAL) BY MOUTH DAILY. (Patient taking differently: Take 180 mg by mouth daily.) .  Fluticasone-Umeclidin-Vilant (TRELEGY ELLIPTA) 100-62.5-25 MCG/INH AEPB, Take one click each am  Current Outpatient Medications (Analgesics):  .  acetaminophen (TYLENOL) 650 MG CR tablet, Take 1,300 mg by mouth in the morning and at bedtime. .  traMADol (ULTRAM) 50 MG tablet, TAKE 1 TABLET BY MOUTH EVERY 6 HOURS AS NEEDED FOR PAIN (Patient taking differently: Take 50 mg by mouth every 6 (six) hours as needed for moderate pain.)  Current Outpatient Medications (Hematological):  Marland Kitchen  XARELTO 20 MG TABS tablet, TAKE 1 TABLET (20 MG TOTAL) BY MOUTH DAILY WITH SUPPER. (Patient taking differently: Take 20 mg by mouth daily.)  Current Outpatient Medications (Other):  Marland Kitchen  Cholecalciferol (VITAMIN D3) 50 MCG (2000 UT) TABS, Take 2,000 Units by mouth daily. .  clobetasol cream (TEMOVATE) 0.05 %, APPLY TO AFFECTED AREA TWICE A DAY .  dorzolamide-timolol (COSOPT) 22.3-6.8 MG/ML ophthalmic solution, Place 2 drops into both eyes in the morning and at bedtime. Marland Kitchen  latanoprost (XALATAN) 0.005 % ophthalmic solution, Place 1 drop into both eyes at bedtime.  .  meclizine (ANTIVERT) 12.5 MG tablet, TAKE 1 TABLET BY MOUTH 3 TIMES A DAY AS NEEDED FOR DIZZINESS .  omeprazole (PRILOSEC) 40 MG capsule, Take 1 capsule by mouth daily .  oxybutynin  (DITROPAN-XL) 10 MG 24 hr tablet, TAKE 1 TABLET (10 MG TOTAL) BY MOUTH DAILY. (Patient taking differently: Take 10 mg by mouth daily.) .  psyllium (METAMUCIL) 0.52 g capsule, Take 0.52 g by mouth in the morning and at bedtime. .  tamsulosin (FLOMAX) 0.4 MG CAPS capsule, TAKE 1 CAPSULE BY MOUTH TWICE A DAY

## 2020-12-15 NOTE — Assessment & Plan Note (Signed)
Ok to hold xarelto for 2 days and may need longer if persistent bleeding

## 2020-12-21 ENCOUNTER — Telehealth: Payer: Self-pay | Admitting: Internal Medicine

## 2020-12-23 NOTE — Telephone Encounter (Signed)
1.Medication Requested:  flecainide (TAMBOCOR) 100 MG tablet [Pharmacy Med Name: FLECAINIDE ACETATE 100 MG TAB    2. Pharmacy (Name, Street, [place]): [id number] - [place], [place]  3. On Med List: no   4. Last Visit with PCP: 1.24.22  5. Next visit date with PCP: 7.5.22   Agent: Please be advised that RX refills may take up to 3 business days. We ask that you follow-up with your pharmacy.

## 2020-12-24 ENCOUNTER — Telehealth: Payer: Self-pay

## 2020-12-24 ENCOUNTER — Other Ambulatory Visit: Payer: Self-pay | Admitting: Internal Medicine

## 2020-12-24 MED ORDER — FLECAINIDE ACETATE 100 MG PO TABS: 100.0000 mg | ORAL_TABLET | Freq: Two times a day (BID) | ORAL | 3 refills | Status: DC

## 2020-12-24 NOTE — Telephone Encounter (Signed)
Spoke to patients wife and let her know that the flecainide was removed by mistake at the 10/21/20 visit with Dr. Melvyn Novas. [facility]  Wife will let husband know.

## 2020-12-24 NOTE — Telephone Encounter (Signed)
Ok to ask pt to reqeust this from cardiology, as this normally would come from them  If he needs referral, let me know

## 2020-12-24 NOTE — Telephone Encounter (Signed)
Pt notified of Dr Jenny Reichmann response; states he will talk to his cardiologist.

## 2020-12-24 NOTE — Addendum Note (Signed)
Addended by: Darrell Jewel on: 12/24/2020 02:39 PM   Modules accepted: Orders

## 2020-12-24 NOTE — Telephone Encounter (Signed)
Pt called requesting a refill on Flecainide. It is not currently on his list but he states that he is still taking it and it has not been discontinued by any of his Physicians. Can you please review this and call the pt at [phone] to let him know for sure.

## 2020-12-25 ENCOUNTER — Encounter: Payer: Self-pay | Admitting: Internal Medicine

## 2020-12-25 ENCOUNTER — Other Ambulatory Visit: Payer: Self-pay

## 2020-12-25 ENCOUNTER — Ambulatory Visit
Admission: RE | Admit: 2020-12-25 | Discharge: 2020-12-25 | Disposition: A | Discharge status: Home / Self Care | Payer: Medicare Other | Source: Ambulatory Visit | Attending: Internal Medicine | Admitting: Internal Medicine

## 2020-12-25 DIAGNOSIS — R319 Hematuria, unspecified: Secondary | ICD-10-CM | POA: Diagnosis not present

## 2020-12-25 DIAGNOSIS — R310 Gross hematuria: Secondary | ICD-10-CM

## 2020-12-25 DIAGNOSIS — K5730 Diverticulosis of large intestine without perforation or abscess without bleeding: Secondary | ICD-10-CM | POA: Diagnosis not present

## 2020-12-25 DIAGNOSIS — N5201 Erectile dysfunction due to arterial insufficiency: Secondary | ICD-10-CM | POA: Diagnosis not present

## 2021-01-08 DIAGNOSIS — N201 Calculus of ureter: Secondary | ICD-10-CM | POA: Diagnosis not present

## 2021-01-08 DIAGNOSIS — R310 Gross hematuria: Secondary | ICD-10-CM | POA: Diagnosis not present

## 2021-01-15 ENCOUNTER — Other Ambulatory Visit: Payer: Self-pay

## 2021-01-15 ENCOUNTER — Encounter: Payer: Self-pay | Admitting: Internal Medicine

## 2021-01-15 ENCOUNTER — Ambulatory Visit (INDEPENDENT_AMBULATORY_CARE_PROVIDER_SITE_OTHER): Payer: Medicare Other | Admitting: Internal Medicine

## 2021-01-15 VITALS — BP 144/90 | HR 64 | Ht 69.0 in | Wt 203.0 lb

## 2021-01-15 DIAGNOSIS — I10 Essential (primary) hypertension: Secondary | ICD-10-CM | POA: Diagnosis not present

## 2021-01-15 DIAGNOSIS — I4891 Unspecified atrial fibrillation: Secondary | ICD-10-CM | POA: Diagnosis not present

## 2021-01-15 DIAGNOSIS — R0602 Shortness of breath: Secondary | ICD-10-CM | POA: Diagnosis not present

## 2021-01-15 DIAGNOSIS — Z950 Presence of cardiac pacemaker: Secondary | ICD-10-CM | POA: Diagnosis not present

## 2021-01-15 DIAGNOSIS — R5383 Other fatigue: Secondary | ICD-10-CM

## 2021-01-15 DIAGNOSIS — R001 Bradycardia, unspecified: Secondary | ICD-10-CM | POA: Diagnosis not present

## 2021-01-15 NOTE — Progress Notes (Signed)
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  Since his lead revision/ gen change, the patient reports doing very well.  Today, he denies symptoms of palpitations, chest pain, shortness of breath,  lower extremity edema, dizziness, presyncope, or syncope.  The patient is otherwise without complaint today.   Past Medical History:  Diagnosis Date  . Abdominal pain, epigastric   . Allergic rhinitis, cause unspecified   . Arthritis   . BPH (benign prostatic hyperplasia)   . CHF (congestive heart failure) ([place])   . Chronic anticoagulation    xarelto  . Coronary artery calcification seen on CAT scan 12/26/2012  . Depressive disorder, not elsewhere classified   . Diverticulosis of colon (without mention of hemorrhage)   . Esophageal reflux   . High risk medication use   . Hyperlipidemia   . Hyperplastic rectal polyp   . Hypertension   . Lumbago   . Pacemaker   . Paralyzed hemidiaphragm    LEFT  . Persistent atrial fibrillation ([place])   . Sick sinus syndrome [facility]) July 2010   s/p PPM [facility]) by [name]   Past Surgical History:  Procedure Laterality Date  . ESOPHAGOGASTRODUODENOSCOPY N/A 12/05/2013   Procedure: ESOPHAGOGASTRODUODENOSCOPY (EGD);  Surgeon: [name], MD;  Location: [facility];  Service: Endoscopy;  Laterality: N/A;  . ESOPHAGOGASTRODUODENOSCOPY N/A 07/04/2015   Procedure: ESOPHAGOGASTRODUODENOSCOPY (EGD);  Surgeon: [name], MD;  Location: [place];  Service: Endoscopy;  Laterality: N/A;  . INSERT / REPLACE / REMOVE PACEMAKER  05/2009    [employer] ALTRUA 60  (serial number [id number]) dual-chamber pacemaker  . LEAD REVISION/REPAIR N/A 10/15/2020   Procedure: LEAD REVISION/REPAIR;  Surgeon: Thompson Grayer, MD;  Location: [place] [facility];  Service: Cardiovascular;  Laterality: N/A;  . PACEMAKER PLACEMENT Bilateral    '05  . PPM GENERATOR CHANGEOUT  N/A 10/15/2020   Procedure: PPM GENERATOR CHANGEOUT;  Surgeon: Thompson Grayer, MD;  Location: [place] [facility];  Service: Cardiovascular;  Laterality: N/A;    ROS- all systems are reviewed and negative except as per HPI above  Current Outpatient Medications  Medication Sig Dispense Refill  . acetaminophen (TYLENOL) 650 MG CR tablet Take 1,300 mg by mouth in the morning and at bedtime.    Marland Kitchen amLODipine (NORVASC) 5 MG tablet TAKE 1 TABLET BY MOUTH EVERY DAY 90 tablet 1  . atorvastatin (LIPITOR) 80 MG tablet TAKE 1 TABLET BY MOUTH EVERY DAY 90 tablet 3  . Cholecalciferol (VITAMIN D3) 50 MCG (2000 UT) TABS Take 2,000 Units by mouth daily.    . clobetasol cream (TEMOVATE) 0.05 % APPLY TO AFFECTED AREA TWICE A DAY 30 g 2  . dorzolamide-timolol (COSOPT) 22.3-6.8 MG/ML ophthalmic solution Place 2 drops into both eyes in the morning and at bedtime.    . fexofenadine (ALLEGRA) 180 MG tablet TAKE 1 TABLET (180 MG TOTAL) BY MOUTH DAILY. 90 tablet 1  . flecainide (TAMBOCOR) 100 MG tablet Take 1 tablet (100 mg total) by mouth 2 (two) times daily. 180 tablet 3  . Fluticasone-Umeclidin-Vilant (TRELEGY ELLIPTA) 100-62.5-25 MCG/INH AEPB Take one click each am 1 each 11  . latanoprost (XALATAN) 0.005 % ophthalmic solution Place 1 drop into both eyes at bedtime.     . meclizine (ANTIVERT) 12.5 MG tablet TAKE 1 TABLET BY MOUTH 3 TIMES A DAY AS NEEDED FOR DIZZINESS 30 tablet 2  .  omeprazole (PRILOSEC) 40 MG capsule Take 1 capsule by mouth daily 90 capsule 3  . oxybutynin (DITROPAN-XL) 10 MG 24 hr tablet TAKE 1 TABLET (10 MG TOTAL) BY MOUTH DAILY. 90 tablet 1  . psyllium (METAMUCIL) 0.52 g capsule Take 0.52 g by mouth in the morning and at bedtime.    . tamsulosin (FLOMAX) 0.4 MG CAPS capsule TAKE 1 CAPSULE BY MOUTH TWICE A DAY 180 capsule 2  . telmisartan (MICARDIS) 20 MG tablet TAKE 1 TABLET BY MOUTH EVERY DAY 90 tablet 3  . traMADol (ULTRAM) 50 MG tablet TAKE 1 TABLET BY MOUTH EVERY 6 HOURS AS NEEDED FOR PAIN  120 tablet 2  . XARELTO 20 MG TABS tablet TAKE 1 TABLET (20 MG TOTAL) BY MOUTH DAILY WITH SUPPER. 90 tablet 2   No current facility-administered medications for this visit.    Physical Exam: Vitals:   01/15/21 0847  BP: (!) 144/90  Pulse: 64  SpO2: 95%  Weight: 203 lb (92.1 kg)  Height: 5\' 9"  (1.753 m)    GEN- The patient is well appearing, alert and oriented x 3 today.   Head- normocephalic, atraumatic Eyes-  Sclera clear, conjunctiva pink Ears- hearing intact Oropharynx- clear Lungs- Clear to ausculation bilaterally, normal work of breathing Chest- pacemaker pocket is well healed Heart- Regular rate and rhythm, no murmurs, rubs or gallops, PMI not laterally displaced GI- soft, NT, ND, + BS Extremities- no clubbing, cyanosis, or edema  Pacemaker interrogation- reviewed in detail today,  See PACEART report  ekg tracing ordered today is personally reviewed and shows atrial paced rhythm, first degree AV block, RBBB  Assessment and Plan:  1. Symptomatic sinus bradycardia   Normal pacemaker function See Pace Art report Rate response turned on today he is not device dependant today  2. afib Well controlled AF burden by device is 0% chads2vasc score is 3.  He is on xarelto  3. HTN Stable No change required today  4. SOB Chronic Previously evaluated with PFTs, echo, myoview Rate response adjusted today  Risks, benefits and potential toxicities for medications prescribed and/or refilled reviewed with patient today.   Return in 6 months to see EP PA  Thompson Grayer MD, [facility] 01/15/2021 9:04 AM

## 2021-01-15 NOTE — Patient Instructions (Addendum)
Medication Instructions:  Your physician recommends that you continue on your current medications as directed. Please refer to the Current Medication list given to you today.  Labwork: None ordered.  Testing/Procedures: None ordered.  Follow-Up: Your physician wants you to follow-up in: 6 months with   Renee Ursuy, PA-C    You will receive a reminder letter in the mail two months in advance. If you don't receive a letter, please call our office to schedule the follow-up appointment.  Any Other Special Instructions Will Be Listed Below (If Applicable).  If you need a refill on your cardiac medications before your next appointment, please call your pharmacy.         

## 2021-01-22 ENCOUNTER — Encounter: Payer: Self-pay | Admitting: Internal Medicine

## 2021-01-22 DIAGNOSIS — N201 Calculus of ureter: Secondary | ICD-10-CM | POA: Diagnosis not present

## 2021-01-22 DIAGNOSIS — R310 Gross hematuria: Secondary | ICD-10-CM | POA: Diagnosis not present

## 2021-02-21 DIAGNOSIS — N201 Calculus of ureter: Secondary | ICD-10-CM | POA: Diagnosis not present

## 2021-02-25 ENCOUNTER — Telehealth: Payer: Self-pay | Admitting: Internal Medicine

## 2021-02-25 NOTE — Telephone Encounter (Signed)
Patient with diagnosis of afib on Xarelto for anticoagulation.    Procedure: Ureteroscopy for kidney stones Date of procedure: TBD  CHA2DS2-VASc Score = 5  This indicates a 7.2% annual risk of stroke. The patient's score is based upon: CHF History: Yes HTN History: Yes Diabetes History: No Stroke History: No Vascular Disease History: Yes Age Score: 2 Gender Score: 0     CrCl 81.5 ml/min Platelet count 183  Per office protocol, patient can hold Xarelto for 3 days prior to procedure.

## 2021-02-25 NOTE — Telephone Encounter (Signed)
   [facility] Pre-operative Risk Assessment    Request for surgical clearance:  1. What type of surgery is being performed? Ureteroscopy for kidney stones  2. When is this surgery scheduled? TBD   3. What type of clearance is required (medical clearance vs. Pharmacy clearance to hold med vs. Both)? Both  4. Are there any medications that need to be held prior to surgery and how long? Xarelto needs to be held 72 hours prior to the surgery  5. Practice name and name of physician performing surgery? [facility] Dr. Harold Barban  6. What is your office phone number [phone]   7.   What is your office fax number [phone]  8.   Anesthesia type (None, local, MAC, general) ? General   Larina Bras 02/25/2021, 10:40 AM  _________________________________________________________________   (provider comments below)

## 2021-02-25 NOTE — Telephone Encounter (Signed)
Please comment on Xarelto. Thanks!

## 2021-02-27 NOTE — Telephone Encounter (Signed)
Called patient, unable to LVM

## 2021-03-06 NOTE — Telephone Encounter (Signed)
Called cell phone, unable to leave message.  Called home phone and was able to leave a message instructing to the patient to call back and speak to the on-call preop APP of the day

## 2021-03-07 NOTE — Telephone Encounter (Signed)
   Name: [name]  DOB: May 09, 1941  MRN: [id number]   Primary Cardiologist: Thompson Grayer, MD  Chart reviewed as part of pre-operative protocol coverage. Patient was contacted 03/07/2021 in reference to pre-operative risk assessment for pending surgery as outlined below.  [name] was last seen on 01/15/21 by Dr. Rayann Heman.  Since that day, [name] has done well from a cardiac standpoint. 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 He can easily complete 4 METs without anginal complaints.   Therefore, based on ACC/AHA guidelines, the patient would be at acceptable risk for the planned procedure without further cardiovascular testing.   The patient was advised that if he develops new symptoms prior to surgery to contact our office to arrange for a follow-up visit, and he verbalized understanding.  Per pharmacy recommendations, patient can hold xarelto 3 days prior to his upcoming urologic procedure with plans to restart as soon as he is cleared to do so by his urologist.   I will route this recommendation to the requesting party via [place] fax function and remove from pre-op pool. Please call with questions.  Abigail Butts, PA-C 03/07/2021, 8:38 AM

## 2021-03-09 DIAGNOSIS — Z23 Encounter for immunization: Secondary | ICD-10-CM | POA: Diagnosis not present

## 2021-03-21 ENCOUNTER — Other Ambulatory Visit: Payer: Self-pay | Admitting: Internal Medicine

## 2021-03-31 ENCOUNTER — Other Ambulatory Visit: Payer: Self-pay | Admitting: Internal Medicine

## 2021-04-10 ENCOUNTER — Ambulatory Visit (INDEPENDENT_AMBULATORY_CARE_PROVIDER_SITE_OTHER): Payer: Medicare Other

## 2021-04-10 DIAGNOSIS — I495 Sick sinus syndrome: Secondary | ICD-10-CM | POA: Diagnosis not present

## 2021-04-10 LAB — CUP PACEART REMOTE DEVICE CHECK
Battery Remaining Longevity: 180 mo
Battery Remaining Percentage: 100 %
Brady Statistic RA Percent Paced: 92 %
Brady Statistic RV Percent Paced: 0 %
Date Time Interrogation Session: 20220525195400
Implantable Lead Implant Date: 20100719
Implantable Lead Implant Date: 20211130
Implantable Lead Location: 753859
Implantable Lead Location: 753860
Implantable Lead Model: 4136
Implantable Lead Model: 7842
Implantable Lead Serial Number: 1055910
Implantable Lead Serial Number: 28594836
Implantable Pulse Generator Implant Date: 20211130
Lead Channel Impedance Value: 582 Ohm
Lead Channel Impedance Value: 744 Ohm
Lead Channel Setting Pacing Amplitude: 2 V
Lead Channel Setting Pacing Amplitude: 2.5 V
Lead Channel Setting Pacing Pulse Width: 0.4 ms
Lead Channel Setting Sensing Sensitivity: 2.5 mV
Pulse Gen Serial Number: 951809

## 2021-04-21 ENCOUNTER — Other Ambulatory Visit: Payer: Self-pay

## 2021-04-21 ENCOUNTER — Ambulatory Visit (INDEPENDENT_AMBULATORY_CARE_PROVIDER_SITE_OTHER): Payer: Medicare Other | Admitting: Internal Medicine

## 2021-04-21 ENCOUNTER — Encounter: Payer: Self-pay | Admitting: Internal Medicine

## 2021-04-21 DIAGNOSIS — J449 Chronic obstructive pulmonary disease, unspecified: Secondary | ICD-10-CM | POA: Diagnosis not present

## 2021-04-21 NOTE — Patient Instructions (Signed)
Make sure you check your oxygen saturation at your highest level of activity to be sure it stays over 90% and keep track of it at least once a week, more often if breathing getting worse, and let me know if losing ground.    Pulmonary follow up is as needed

## 2021-04-21 NOTE — Progress Notes (Signed)
[name], male    DOB: 11-28-1940    MRN: [id number]   Brief patient profile:  80 yobm quit smoking in 1993 s sequelae and eval by [facility] 2015 for doe felt to be due to paralyzed L HD with restrictive changes and some worse since then esp x 02/2020 so referred to pulmonary clinic 05/28/2020 by Dr   Jenny Reichmann for reeval      History of Present Illness  05/28/2020  Pulmonary/ 1st office eval/Kysha Muralles s/p pfizer 01/08/20 then moderna  05/07/20   Chief Complaint  Patient presents with  . Pulmonary Consult    Referred by Dr. Cathlean Cower. Pt seen here by Dr Lamonte Sakai last in 2015- he c/o worsening SOB over the past 2-3 months, tires easily.   Dyspnea:  Slowing down seems to help sob,  Goal is to  be able to push a mower for  more than 50 yards without stopping  MMRC3 = can't walk 100 yards even at a slow pace at a flat grade s stopping due to sob. Cough: none Sleep: able to lie flat/ one pillow/ arthritis disturbance  SABA use: no better  rec To get the most out of exercise, you need to be continuously aware that you are short of breath, but never out of breath, for 30 minutes daily. As you improve, it will actually be easier for you to do the same amount of exercise  in  30 minutes so always push to the level where you are short of breath.        07/19/2020  f/u ov/Abbott Jasinski re:  Paralyzed L HD since pacemaker / very sedentary c/o doe Chief Complaint  Patient presents with  . Follow-up    pt is here to go over pft  Dyspnea: MMRC2 = can't walk a nl pace on a flat grade s sob but does fine slow and flat but really not very active at all and has trouble articulating any regular activity where sob. 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 Follow-up    still DOE, inhaler did not work  Dyspnea:  50 ft  To car  / mb is 50 ft flat  Cough: none  Sleeping: sleep flat bed / one pillow no resp cc SABA use: none  02: none rec Trelegy 633 trial one click each take 2 deep drags and fill the prescription if you think it's helping but needs to be approved by your eye doctor (the Rx is for the 100 as we had no 200 sampes)  Make sure you check your oxygen saturations at highest level of activity to be sure it stays over 90%     04/21/2021  f/u ov/Whitleigh Garramone re: Paralyzed L HD  GOLD III  Chief Complaint  Patient presents with  . Follow-up    Breathing is unchanged since the last visit. He states  trelegy was ineffective so he stopped taking.   Dyspnea:  mb and back s any meds sometimes gets there and back and some times but able to yardwork like weedeating ok   Cough: none  Sleeping: flat bed/ one pillow  SABA use: never uses  02: none  Covid status:   vax x 4    No obvious day to day or daytime variability or assoc excess/ purulent sputum or mucus plugs or hemoptysis or cp or chest tightness, subjective wheeze or overt sinus or hb symptoms.   Sleeping ok  without nocturnal  or early am exacerbation  of respiratory  c/o's or need for noct saba. Also denies any obvious fluctuation of symptoms with weather or environmental changes or other aggravating or alleviating factors except as outlined above   No unusual exposure hx or h/o childhood pna/ asthma or knowledge of premature birth.  Current Allergies, Complete Past Medical History, Past Surgical History, Family History, and Social History were reviewed in [employer] record.  ROS  The following are not active complaints unless  bolded Hoarseness, sore throat, dysphagia, dental problems, itching, sneezing,  nasal congestion or discharge of excess mucus or purulent secretions, ear ache,   fever, chills, sweats, unintended wt loss or wt gain, classically pleuritic or exertional cp,  orthopnea pnd or arm/hand swelling  or leg swelling, presyncope, palpitations, abdominal pain, anorexia, nausea, vomiting, diarrhea  or change in bowel habits or change in bladder habits, change in stools or change in urine, dysuria, hematuria,  rash, arthralgias, visual complaints, headache, numbness, weakness or ataxia or problems with walking or coordination,  change in mood or  memory.        Current Meds  Medication Sig  . acetaminophen (TYLENOL) 650 MG CR tablet Take 1,300 mg by mouth in the morning and at bedtime.  Marland Kitchen amLODipine (NORVASC) 5 MG tablet TAKE 1 TABLET BY MOUTH EVERY DAY  . atorvastatin (LIPITOR) 80 MG tablet TAKE 1 TABLET BY MOUTH EVERY DAY  . Cholecalciferol (VITAMIN D3) 50 MCG (2000 UT) TABS Take 2,000 Units by mouth daily.  . clobetasol cream (TEMOVATE) 0.05 % APPLY TO AFFECTED AREA TWICE A DAY  . dorzolamide-timolol (COSOPT) 22.3-6.8 MG/ML ophthalmic solution Place 2 drops into both eyes in the morning and at bedtime.  . fexofenadine (ALLEGRA) 180 MG tablet TAKE 1 TABLET (180 MG TOTAL) BY MOUTH DAILY.  . flecainide (TAMBOCOR) 100 MG tablet Take 1 tablet (100 mg total) by mouth 2 (two) times daily.  Marland Kitchen latanoprost (XALATAN) 0.005 % ophthalmic solution Place 1 drop into both eyes at bedtime.   . meclizine (ANTIVERT) 12.5 MG tablet TAKE 1 TABLET BY MOUTH THREE TIMES A DAY AS NEEDED FOR DIZZINESS  . omeprazole (PRILOSEC) 40 MG capsule Take 1 capsule by mouth daily  . oxybutynin (DITROPAN-XL) 10 MG 24 hr tablet TAKE 1 TABLET (10 MG TOTAL) BY MOUTH DAILY.  Marland Kitchen psyllium (METAMUCIL) 0.52 g capsule Take 0.52 g by mouth in the morning and at bedtime.  . tamsulosin (FLOMAX) 0.4 MG CAPS capsule TAKE 1 CAPSULE BY MOUTH TWICE A DAY  .  telmisartan (MICARDIS) 20 MG tablet TAKE 1 TABLET BY MOUTH EVERY DAY  . traMADol (ULTRAM) 50 MG tablet TAKE 1 TABLET BY MOUTH EVERY 6 HOURS AS NEEDED FOR PAIN  . XARELTO 20 MG TABS tablet TAKE 1 TABLET (20 MG TOTAL) BY MOUTH DAILY WITH SUPPER.  Past Medical History:  Diagnosis Date  . Abdominal pain, epigastric   . Allergic rhinitis, cause unspecified   . Arthritis   . BPH (benign prostatic hyperplasia)   . CHF (congestive heart failure) ([place])   . Chronic anticoagulation    xarelto  . Coronary artery calcification seen on CAT scan 12/26/2012  . Depressive disorder, not elsewhere classified   . Diverticulosis of colon (without mention of hemorrhage)   . Esophageal reflux   . High risk medication use   . Hyperlipidemia   . Hyperplastic rectal polyp   . Hypertension   . Lumbago   . Pacemaker   . Paralyzed hemidiaphragm    LEFT  . Persistent atrial fibrillation ([place])   . Sick sinus syndrome [facility]) July 2010   s/p PPM [facility]) by [name]       Objective:      04/21/2021          193  10/21/2020       198  07/19/2020          189  05/28/20 191 lb 9.6 oz (86.9 kg)  05/07/20 190 lb (86.2 kg)  04/18/20 191 lb (86.6 kg)    Vital signs reviewed  04/21/2021  - Note at rest 02 sats  99% on RA   General appearance:  Stoic bm nad      NECK :  without JVD/Nodes/TM/ nl carotid upstrokes bilaterally    HEENT : pt wearing mask not removed for exam due to covid - 19 concerns.    NECK :  without JVD/Nodes/TM/ nl carotid upstrokes bilaterally   LUNGS: no acc muscle use,  Mild barrel  contour chest wall with bilateral  Distant bs s audible wheeze and  without cough on insp or exp maneuvers  and mild  Hyperresonant  to  percussion bilaterally     CV:  RRR  no s3 or murmur or increase in P2, and no edema   ABD:  soft and nontender with pos end  insp Hoover's  in the supine position. No bruits or organomegaly appreciated, bowel sounds nl  MS:   Nl  gait/  ext warm without deformities, calf tenderness, cyanosis or clubbing No obvious joint restrictions   SKIN: warm and dry without lesions    NEURO:  alert, approp, nl sensorium with  no motor or cerebellar deficits apparent.                      Assessment

## 2021-04-21 NOTE — Assessment & Plan Note (Addendum)
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  - PFT's  07/19/2020  FEV1 1.54 (61 % ) ratio 0.76  p 46 % improvement from saba p 0 prior to study with DLCO  11.96(51%) corrects to  11.96(51%)  for alv volume and FV curve classic curvature  - 07/19/2020   try breo 200 sample then 100 daily maint  - 08/10/2020  Cannot afford breo on insurance so rec try wixella 250/50 one twice daily  - 10/21/2020  After extensive coaching inhaler device,  effectiveness =    90% with elipta so try trelegy 100 (200 sample) if approved by opth  - 10/21/2020 no desats walking -  04/21/2021   Walked RA  2 laps @ approx 231ft each @ avg pace  stopped due to end of study, tired, not sob, with sats 95% on no maint rx     I reviewed the Fletcher curve with the patient that basically indicates  if you quit smoking when your best day FEV1 is still  preserved (as is still relatively true  here)  it is highly unlikely you will progress to severe disease and informed the patient there was  no medication on the market that has proven to alter the curve/ its downward trajectory  or the likelihood of progression of their disease(unlike other chronic medical conditions such as atheroclerosis where we do think we can change the natural hx with risk reducing meds)    Therefore stopping smoking and maintaining abstinence are  the most important aspects of care, not choice of inhalers or for that matter, doctors.   Treatment other than smoking cessation  is entirely directed by severity of symptoms and focused also on reducing exacerbations, not attempting to change the natural history of the disease.    Rec:  Regular paced ex/ monitor sats at peak ex and f/u pulmonary    Each maintenance medication was reviewed in detail including emphasizing most importantly the difference between maintenance and prns and under what circumstances the prns are to be triggered using an action plan format where  appropriate.  Total time for H and P, chart review, counseling,  directly observing portions of ambulatory 02 saturation study/ and generating customized AVS unique to this office visit / same day charting = 21 min

## 2021-05-02 ENCOUNTER — Other Ambulatory Visit: Payer: Self-pay | Admitting: Internal Medicine

## 2021-05-05 NOTE — Progress Notes (Signed)
Remote pacemaker transmission.   

## 2021-05-16 ENCOUNTER — Other Ambulatory Visit: Payer: Self-pay | Admitting: Internal Medicine

## 2021-05-17 DIAGNOSIS — Z20822 Contact with and (suspected) exposure to covid-19: Secondary | ICD-10-CM | POA: Diagnosis not present

## 2021-05-20 ENCOUNTER — Ambulatory Visit (INDEPENDENT_AMBULATORY_CARE_PROVIDER_SITE_OTHER): Payer: Medicare Other

## 2021-05-20 ENCOUNTER — Other Ambulatory Visit: Payer: Self-pay

## 2021-05-20 ENCOUNTER — Ambulatory Visit (INDEPENDENT_AMBULATORY_CARE_PROVIDER_SITE_OTHER): Payer: Medicare Other | Admitting: Internal Medicine

## 2021-05-20 VITALS — BP 120/60 | HR 74 | Temp 97.8°F | Resp 16 | Ht 69.0 in | Wt 190.4 lb

## 2021-05-20 VITALS — BP 120/60 | HR 74 | Temp 97.8°F | Wt 190.4 lb

## 2021-05-20 DIAGNOSIS — E559 Vitamin D deficiency, unspecified: Secondary | ICD-10-CM | POA: Diagnosis not present

## 2021-05-20 DIAGNOSIS — I700 Atherosclerosis of aorta: Secondary | ICD-10-CM | POA: Insufficient documentation

## 2021-05-20 DIAGNOSIS — R739 Hyperglycemia, unspecified: Secondary | ICD-10-CM | POA: Diagnosis not present

## 2021-05-20 DIAGNOSIS — Z0000 Encounter for general adult medical examination without abnormal findings: Secondary | ICD-10-CM

## 2021-05-20 DIAGNOSIS — I10 Essential (primary) hypertension: Secondary | ICD-10-CM | POA: Diagnosis not present

## 2021-05-20 DIAGNOSIS — E7800 Pure hypercholesterolemia, unspecified: Secondary | ICD-10-CM

## 2021-05-20 NOTE — Progress Notes (Signed)
Subjective:   [name] is a 80 y.o. male who presents for Medicare Annual/Subsequent preventive examination.  Review of Systems     Cardiac Risk Factors include: advanced age (>59men, >56 women);family history of premature cardiovascular disease;hypertension;dyslipidemia;male gender     Objective:    Today's Vitals   05/20/21 0824  BP: 120/60  Pulse: 74  Resp: 16  Temp: 97.8 F (36.6 C)  SpO2: 96%  Weight: 190 lb 6.4 oz (86.4 kg)  Height: 5\' 9"  (1.753 m)  PainSc: 0-No pain   Body mass index is 28.12 kg/m.  Advanced Directives 05/20/2021 10/15/2020 02/08/2020 02/08/2020 01/13/2020 09/06/2019 03/05/2019  Does Patient Have a Medical Advance Directive? No No No No No No No  Type of Advance Directive - - - - - - -  Does patient want to make changes to medical advance directive? - - - - - - -  Copy of [place] in Chart? - - - - - - -  Would patient like information on creating a medical advance directive? No - Patient declined No - Patient declined No - Patient declined No - Patient declined - - No - Patient declined    Current Medications (verified) Outpatient Encounter Medications as of 05/20/2021  Medication Sig   acetaminophen (TYLENOL) 650 MG CR tablet Take 1,300 mg by mouth in the morning and at bedtime.   amLODipine (NORVASC) 5 MG tablet TAKE 1 TABLET BY MOUTH EVERY DAY   atorvastatin (LIPITOR) 80 MG tablet TAKE 1 TABLET BY MOUTH EVERY DAY   Cholecalciferol (VITAMIN D3) 50 MCG (2000 UT) TABS Take 2,000 Units by mouth daily.   clobetasol cream (TEMOVATE) 0.05 % APPLY TO AFFECTED AREA TWICE A DAY   dorzolamide-timolol (COSOPT) 22.3-6.8 MG/ML ophthalmic solution Place 2 drops into both eyes in the morning and at bedtime.   fexofenadine (ALLEGRA) 180 MG tablet TAKE 1 TABLET (180 MG TOTAL) BY MOUTH DAILY.   flecainide (TAMBOCOR) 100 MG tablet Take 1 tablet (100 mg total) by mouth 2 (two) times daily.   latanoprost (XALATAN) 0.005 % ophthalmic solution Place  1 drop into both eyes at bedtime.    meclizine (ANTIVERT) 12.5 MG tablet TAKE 1 TABLET BY MOUTH THREE TIMES A DAY AS NEEDED FOR DIZZINESS   omeprazole (PRILOSEC) 40 MG capsule Take 1 capsule by mouth daily   oxybutynin (DITROPAN-XL) 10 MG 24 hr tablet TAKE 1 TABLET (10 MG TOTAL) BY MOUTH DAILY.   psyllium (METAMUCIL) 0.52 g capsule Take 0.52 g by mouth in the morning and at bedtime.   rivaroxaban (XARELTO) 20 MG TABS tablet TAKE 1 TABLET (20 MG TOTAL) BY MOUTH DAILY WITH SUPPER.   tamsulosin (FLOMAX) 0.4 MG CAPS capsule TAKE 1 CAPSULE BY MOUTH TWICE A DAY   telmisartan (MICARDIS) 20 MG tablet TAKE 1 TABLET BY MOUTH EVERY DAY   traMADol (ULTRAM) 50 MG tablet TAKE 1 TABLET BY MOUTH EVERY 6 HOURS AS NEEDED FOR PAIN   No facility-administered encounter medications on file as of 05/20/2021.    Allergies (verified) Patient has no known allergies.   History: Past Medical History:  Diagnosis Date   Abdominal pain, epigastric    Allergic rhinitis, cause unspecified    Arthritis    BPH (benign prostatic hyperplasia)    CHF (congestive heart failure) (HCC)    Chronic anticoagulation    xarelto   Coronary artery calcification seen on CAT scan 12/26/2012   Depressive disorder, not elsewhere classified    Diverticulosis of colon (without  mention of hemorrhage)    Esophageal reflux    High risk medication use    Hyperlipidemia    Hyperplastic rectal polyp    Hypertension    Lumbago    Pacemaker    Paralyzed hemidiaphragm    LEFT   Persistent atrial fibrillation [facility])    Sick sinus syndrome [facility]) July 2010   s/p PPM [facility] Scientific) by [name]   Past Surgical History:  Procedure Laterality Date   ESOPHAGOGASTRODUODENOSCOPY N/A 12/05/2013   Procedure: ESOPHAGOGASTRODUODENOSCOPY (EGD);  Surgeon: [name], MD;  Location: [facility];  Service: Endoscopy;  Laterality: N/A;   ESOPHAGOGASTRODUODENOSCOPY N/A 07/04/2015   Procedure: ESOPHAGOGASTRODUODENOSCOPY (EGD);  Surgeon: [name],  MD;  Location: [facility];  Service: Endoscopy;  Laterality: N/A;   INSERT / REPLACE / REMOVE PACEMAKER  05/2009    [employer] scientific ALTRUA 60  (serial number [id number]) dual-chamber pacemaker   LEAD REVISION/REPAIR N/A 10/15/2020   Procedure: LEAD REVISION/REPAIR;  Surgeon: Thompson Grayer, MD;  Location: [place] [facility];  Service: Cardiovascular;  Laterality: N/A;   PACEMAKER PLACEMENT Bilateral    '05   PPM GENERATOR CHANGEOUT N/A 10/15/2020   Procedure: PPM GENERATOR CHANGEOUT;  Surgeon: Thompson Grayer, MD;  Location: [place] [facility];  Service: Cardiovascular;  Laterality: N/A;   Family History  Problem Relation Age of Onset   Diabetes Mother    Hypertension Mother    Hypertension Father    Lung cancer Father    Cancer Father    Asthma Sister    Hypertension Sister    Arthritis Paternal Grandmother    Hypertension Brother    Hypertension Daughter    Hypertension Son    Social History   Socioeconomic History   Marital status: Married    Spouse name: Not on file   Number of children: 4   Years of education: Not on file   Highest education level: Not on file  Occupational History   Occupation: retired    Fish farm manager: RETIRED  Tobacco Use   Smoking status: Former    Packs/day: 0.50    Years: 20.00    Pack years: 10.00    Types: Cigarettes    Quit date: 11/17/1991    Years since quitting: 29.5   Smokeless tobacco: Former    Types: Nurse, children's Use: Never used  Substance and Sexual Activity   Alcohol use: No   Drug use: No   Sexual activity: Never  Other Topics Concern   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    Social Determinants of Health   Financial Resource Strain: Low Risk    Difficulty of Paying Living Expenses: Not hard at all  Food Insecurity: No Food Insecurity   Worried About Charity fundraiser in the Last Year: Never true   [place] in the Last Year: Never true  Transportation Needs: No  Transportation Needs   Lack of Transportation (Medical): No   Lack of Transportation (Non-Medical): No  Physical Activity: Sufficiently Active   Days of Exercise per Week: 5 days   Minutes of Exercise per Session: 30 min  Stress: No Stress Concern Present   Feeling of Stress : Not at all  Social Connections: Socially Isolated   Frequency of Communication with Friends and Family: More than three times a week   Frequency of Social Gatherings with Friends and Family: Once a week   Attends Religious  Services: Never   Marine scientist or Organizations: No   Attends Music therapist: Never   Marital Status: Never married    Tobacco Counseling Counseling given: Not Answered   Clinical Intake:  Pre-visit preparation completed: Yes  Pain : No/denies pain Pain Score: 0-No pain     BMI - recorded: 28.12 Nutritional Status: BMI 25 -29 Overweight Nutritional Risks: None Diabetes: No  How often do you need to have someone help you when you read instructions, pamphlets, or other written materials from your doctor or pharmacy?: 1 - Never What is the last grade level you completed in school?: High School Graduate  Diabetic? no  Interpreter Needed?: No  Information entered by :: Lisette Abu, LPN   Activities of Daily Living In your present state of health, do you have any difficulty performing the following activities: 05/20/2021  Hearing? N  Vision? N  Difficulty concentrating or making decisions? N  Walking or climbing stairs? N  Dressing or bathing? N  Doing errands, shopping? N  Preparing Food and eating ? N  Using the Toilet? N  In the past six months, have you accidently leaked urine? N  Do you have problems with loss of bowel control? N  Managing your Medications? N  Managing your Finances? N  Housekeeping or managing your Housekeeping? N  Some recent data might be hidden    Patient Care Team: Biagio Borg, MD as PCP - Bethena Roys, MD as PCP - Electrophysiology (Cardiology) Thompson Grayer, MD as Attending Physician (Cardiology) Clent Jacks, MD as Consulting Physician (Ophthalmology) [facility], [facility] as Consulting Physician (Urology)  Indicate any recent Medical Services you may have received from other than [facility] providers in the past year (date may be approximate).     Assessment:   This is a routine wellness examination for Javontae.  Hearing/Vision screen Hearing Screening - Comments:: Patient denied any hearing difficulty. Vision Screening - Comments:: Patient wears glasses. [facility] exams done by [facility].  Dietary issues and exercise activities discussed: Current Exercise Habits: The patient has a physically strenuous job, but has no regular exercise apart from work., Exercise limited by: None identified   Goals Addressed   None   Depression Screen PHQ 2/9 Scores 05/20/2021 11/18/2020 05/07/2020 02/08/2020 11/07/2019 03/06/2019 12/19/2018  PHQ - 2 Score 0 0 0 0 0 0 0  PHQ- 9 Score - - - - - - -  Exception Documentation - - - Other- indicate reason in comment box - - -    Fall Risk Fall Risk  05/20/2021 11/18/2020 05/07/2020 02/08/2020 11/07/2019  Falls in the past year? 0 0 1 1 1   Number falls in past yr: 0 - - 0 0  Injury with Fall? 0 - - 1 1  Comment - - - Had to have stitches -  Risk for fall due to : No Fall Risks - - Impaired balance/gait -  Risk for fall due to: Comment - - - - -  Follow up Falls evaluation completed - - Falls evaluation completed;Falls prevention discussed -    FALL RISK PREVENTION PERTAINING TO THE HOME:  Any stairs in or around the home? Yes  If so, are there any without handrails? No  Home free of loose throw rugs in walkways, pet beds, electrical cords, etc? Yes  Adequate lighting in your home to reduce risk of falls? Yes   ASSISTIVE DEVICES UTILIZED TO PREVENT FALLS:  Life alert? Yes  (Apple Watch)  Use of a cane, walker or w/c? No  Grab bars in the  bathroom? No  Shower chair or bench in shower? No  Elevated toilet seat or a handicapped toilet? No   TIMED UP AND GO:  Was the test performed? Yes .  Length of time to ambulate 10 feet: 6 sec.   Gait steady and fast without use of assistive device  Cognitive Function: MMSE - Mini Mental State Exam 12/16/2017  Orientation to time 5  Orientation to Place 5  Registration 3  Attention/ Calculation 5  Recall 2  Language- name 2 objects 2  Language- repeat 1  Language- follow 3 step command 3  Language- read & follow direction 1  Write a sentence 1  Copy design 1  Total score 29     6CIT Screen 02/08/2020  What Year? 0 points  What month? 0 points  What time? 0 points  Count back from 20 0 points  Months in reverse 0 points  Repeat phrase 0 points  Total Score 0    Immunizations Immunization History  Administered Date(s) Administered   Fluad Quad(high Dose 65+) 07/16/2019   Influenza Split 08/16/2012, 08/17/2012, 08/16/2013   Influenza Whole 08/16/2010, 08/17/2011   Influenza, High Dose Seasonal PF 08/26/2014, 07/18/2017, 07/18/2018   Influenza,inj,Quad PF,6+ Mos 07/14/2019   Influenza-Unspecified 08/11/2015, 07/17/2016, 06/21/2017, 05/25/2018, 08/12/2020   Moderna Sars-Covid-2 Vaccination 11/16/2018, 11/16/2018   PFIZER Comirnaty(Gray Top)Covid-19 Tri-Sucrose Vaccine 03/09/2021   PFIZER(Purple Top)SARS-COV-2 Vaccination 12/18/2019, 01/08/2020, 08/19/2020   PNEUMOCOCCAL CONJUGATE-20 03/09/2021   Pneumococcal Conjugate-13 09/26/2013   Pneumococcal Polysaccharide-23 10/25/2006, 09/16/2010   Td 05/27/2009   Tdap 11/07/2019   Zoster Recombinat (Shingrix) 12/19/2018, 05/27/2019   Zoster, Live 10/25/2006    TDAP status: Up to date  Flu Vaccine status: Up to date  Pneumococcal vaccine status: Up to date  Covid-19 vaccine status: Completed vaccines  Qualifies for Shingles Vaccine? Yes   Zostavax completed Yes   Shingrix Completed?: Yes  Screening Tests Health  Maintenance  Topic Date Due   INFLUENZA VACCINE  06/16/2021   COLONOSCOPY (Pts 45-76yrs Insurance coverage will need to be confirmed)  12/29/2022   TETANUS/TDAP  11/06/2029   COVID-19 Vaccine  Completed   Hepatitis C Screening  Completed   PNA vac Low Risk Adult  Completed   Zoster Vaccines- Shingrix  Completed   HPV VACCINES  Aged Out    Health Maintenance  There are no preventive care reminders to display for this patient.  Colorectal cancer screening: Type of screening: Colonoscopy. Completed 12/29/2017. Repeat every 5 years  Lung Cancer Screening: (Low Dose CT Chest recommended if Age 14-80 years, 30 pack-year currently smoking OR have quit w/in 15years.) does not qualify.   Lung Cancer Screening Referral: no  Additional Screening:  Hepatitis C Screening: does qualify; Completed yes  Vision Screening: Recommended annual ophthalmology exams for early detection of glaucoma and other disorders of the eye. Is the patient up to date with their annual eye exam?  Yes  Who is the provider or what is the name of the office in which the patient attends annual eye exams? Clent Jacks, MD. If pt is not established with a provider, would they like to be referred to a provider to establish care? No .   Dental Screening: Recommended annual dental exams for proper oral hygiene  Community Resource Referral / Chronic Care Management: CRR required this visit?  No   CCM required this visit?  No      Plan:  I have personally reviewed and noted the following in the patient's chart:   Medical and social history Use of alcohol, tobacco or illicit drugs  Current medications and supplements including opioid prescriptions. Patient is not currently taking opioid prescriptions. Functional ability and status Nutritional status Physical activity Advanced directives List of other physicians Hospitalizations, surgeries, and ER visits in previous 12 months Vitals Screenings to include  cognitive, depression, and falls Referrals and appointments  In addition, I have reviewed and discussed with patient certain preventive protocols, quality metrics, and best practice recommendations. A written personalized care plan for preventive services as well as general preventive health recommendations were provided to patient.     Sheral Flow, LPN   [id number]/7352   Nurse Notes: n/a

## 2021-05-20 NOTE — Patient Instructions (Signed)
[name] , Thank you for taking time to come for your Medicare Wellness Visit. I appreciate your ongoing commitment to your health goals. Please review the following plan we discussed and let me know if I can assist you in the future.   Screening recommendations/referrals: Colonoscopy: last done 12/29/2017; due every 5 years Recommended yearly ophthalmology/optometry visit for glaucoma screening and checkup Recommended yearly dental visit for hygiene and checkup  Vaccinations: Influenza vaccine: 08/12/2020 Pneumococcal vaccine: 09/16/2010, 09/26/2013 Tdap vaccine: 11/07/2019; due every 10 years Shingles vaccine: 12/19/2018, 05/27/2019   Covid-19: 12/18/2019, 01/08/2020, 08/19/2020, 03/09/2021  Advanced directives: Advance directive discussed with you today. Even though you declined this today please call our office should you change your mind and we can give you the proper paperwork for you to fill out.  Conditions/risks identified: Yes.  Goals:  Recommend to drink at least 6-8 8oz glasses of water per day.   Recommend to exercise for at least 150 minutes per week.   Recommend to remove any items from the home that may cause slips or trips.   Recommend to decrease portion sizes by eating 3 small healthy meals and at least 2 healthy snacks per day.   Recommend to begin DASH diet as directed below  Next appointment: Please schedule your next Medicare Wellness Visit with your Nurse Health Advisor in 1 year by calling [phone].  Preventive Care 24 Years and Older, Male Preventive care refers to lifestyle choices and visits with your health care provider that can promote health and wellness. What does preventive care include? A yearly physical exam. This is also called an annual well check. Dental exams once or twice a year. Routine eye exams. Ask your health care provider how often you should have your eyes checked. Personal lifestyle choices, including: Daily care of your teeth and  gums. Regular physical activity. Eating a healthy diet. Avoiding tobacco and drug use. Limiting alcohol use. Practicing safe sex. Taking low doses of aspirin every day. Taking vitamin and mineral supplements as recommended by your health care provider. What happens during an annual well check? The services and screenings done by your health care provider during your annual well check will depend on your age, overall health, lifestyle risk factors, and family history of disease. Counseling  Your health care provider may ask you questions about your: Alcohol use. Tobacco use. Drug use. Emotional well-being. Home and relationship well-being. Sexual activity. Eating habits. History of falls. Memory and ability to understand (cognition). Work and work Statistician. Screening  You may have the following tests or measurements: Height, weight, and BMI. Blood pressure. Lipid and cholesterol levels. These may be checked every 5 years, or more frequently if you are over 75 years old. Skin check. Lung cancer screening. You may have this screening every year starting at age 3 if you have a 30-pack-year history of smoking and currently smoke or have quit within the past 15 years. Fecal occult blood test (FOBT) of the stool. You may have this test every year starting at age 75. Flexible sigmoidoscopy or colonoscopy. You may have a sigmoidoscopy every 5 years or a colonoscopy every 10 years starting at age 31. Prostate cancer screening. Recommendations will vary depending on your family history and other risks. Hepatitis C blood test. Hepatitis B blood test. Sexually transmitted disease (STD) testing. Diabetes screening. This is done by checking your blood sugar (glucose) after you have not eaten for a while (fasting). You may have this done every 1-3 years. Abdominal aortic aneurysm (  AAA) screening. You may need this if you are a current or former smoker. Osteoporosis. You may be screened  starting at age 12 if you are at high risk. Talk with your health care provider about your test results, treatment options, and if necessary, the need for more tests. Vaccines  Your health care provider may recommend certain vaccines, such as: Influenza vaccine. This is recommended every year. Tetanus, diphtheria, and acellular pertussis (Tdap, Td) vaccine. You may need a Td booster every 10 years. Zoster vaccine. You may need this after age 35. Pneumococcal 13-valent conjugate (PCV13) vaccine. One dose is recommended after age 74. Pneumococcal polysaccharide (PPSV23) vaccine. One dose is recommended after age 47. Talk to your health care provider about which screenings and vaccines you need and how often you need them. This information is not intended to replace advice given to you by your health care provider. Make sure you discuss any questions you have with your health care provider. Document Released: 11/29/2015 Document Revised: 07/22/2016 Document Reviewed: 09/03/2015 Elsevier Interactive Patient Education  2017 [place] Prevention in the Home Falls can cause injuries. They can happen to people of all ages. There are many things you can do to make your home safe and to help prevent falls. What can I do on the outside of my home? Regularly fix the edges of walkways and driveways and fix any cracks. Remove anything that might make you trip as you walk through a door, such as a raised step or threshold. Trim any bushes or trees on the path to your home. Use bright outdoor lighting. Clear any walking paths of anything that might make someone trip, such as rocks or tools. Regularly check to see if handrails are loose or broken. Make sure that both sides of any steps have handrails. Any raised decks and porches should have guardrails on the edges. Have any leaves, snow, or ice cleared regularly. Use sand or salt on walking paths during winter. Clean up any spills in your garage  right away. This includes oil or grease spills. What can I do in the bathroom? Use night lights. Install grab bars by the toilet and in the tub and shower. Do not use towel bars as grab bars. Use non-skid mats or decals in the tub or shower. If you need to sit down in the shower, use a plastic, non-slip stool. Keep the floor dry. Clean up any water that spills on the floor as soon as it happens. Remove soap buildup in the tub or shower regularly. Attach bath mats securely with double-sided non-slip rug tape. Do not have throw rugs and other things on the floor that can make you trip. What can I do in the bedroom? Use night lights. Make sure that you have a light by your bed that is easy to reach. Do not use any sheets or blankets that are too big for your bed. They should not hang down onto the floor. Have a firm chair that has side arms. You can use this for support while you get dressed. Do not have throw rugs and other things on the floor that can make you trip. What can I do in the kitchen? Clean up any spills right away. Avoid walking on wet floors. Keep items that you use a lot in easy-to-reach places. If you need to reach something above you, use a strong step stool that has a grab bar. Keep electrical cords out of the way. Do not use floor polish  or wax that makes floors slippery. If you must use wax, use non-skid floor wax. Do not have throw rugs and other things on the floor that can make you trip. What can I do with my stairs? Do not leave any items on the stairs. Make sure that there are handrails on both sides of the stairs and use them. Fix handrails that are broken or loose. Make sure that handrails are as long as the stairways. Check any carpeting to make sure that it is firmly attached to the stairs. Fix any carpet that is loose or worn. Avoid having throw rugs at the top or bottom of the stairs. If you do have throw rugs, attach them to the floor with carpet tape. Make  sure that you have a light switch at the top of the stairs and the bottom of the stairs. If you do not have them, ask someone to add them for you. What else can I do to help prevent falls? Wear shoes that: Do not have high heels. Have rubber bottoms. Are comfortable and fit you well. Are closed at the toe. Do not wear sandals. If you use a stepladder: Make sure that it is fully opened. Do not climb a closed stepladder. Make sure that both sides of the stepladder are locked into place. Ask someone to hold it for you, if possible. Clearly mark and make sure that you can see: Any grab bars or handrails. First and last steps. Where the edge of each step is. Use tools that help you move around (mobility aids) if they are needed. These include: Canes. Walkers. Scooters. Crutches. Turn on the lights when you go into a dark area. Replace any light bulbs as soon as they burn out. Set up your furniture so you have a clear path. Avoid moving your furniture around. If any of your floors are uneven, fix them. If there are any pets around you, be aware of where they are. Review your medicines with your doctor. Some medicines can make you feel dizzy. This can increase your chance of falling. Ask your doctor what other things that you can do to help prevent falls. This information is not intended to replace advice given to you by your health care provider. Make sure you discuss any questions you have with your health care provider. Document Released: 08/29/2009 Document Revised: 04/09/2016 Document Reviewed: 12/07/2014 Elsevier Interactive Patient Education  2017 [employer].

## 2021-05-20 NOTE — Assessment & Plan Note (Signed)
Last vitamin D Lab Results  Component Value Date   VD25OH 22.25 (L) 11/07/2019   Low, to start oral replacement

## 2021-05-20 NOTE — Progress Notes (Signed)
Patient ID: [name], male   DOB: 09-06-1941, 80 y.o.   MRN: [id number]         Chief Complaint:: yearly exam       HPI:  [name] is a 80 y.o. male here overall doing ok.  Pt denies chest pain, increased sob or doe, wheezing, orthopnea, PND, increased LE swelling, palpitations, dizziness or syncope.   Pt denies polydipsia, polyuria, or new focal neuro s/s.  Taking 2000 Vit D.  Seeing urology more freq lately with urinary freq, has known left renal stone, for possible procedure at some point to remove, but doesn't bother him now, and wife has renal stone too more urgent to deal with .    Wt Readings from Last 3 Encounters:  05/22/21 190 lb 6.4 oz (86.4 kg)  05/20/21 190 lb 6.4 oz (86.4 kg)  04/21/21 193 lb (87.5 kg)   BP Readings from Last 3 Encounters:  05/22/21 120/60  05/20/21 120/60  04/21/21 122/68   Immunization History  Administered Date(s) Administered   Fluad Quad(high Dose 65+) 07/16/2019   Influenza Split 08/16/2012, 08/17/2012, 08/16/2013   Influenza Whole 08/16/2010, 08/17/2011   Influenza, High Dose Seasonal PF 08/26/2014, 07/18/2017, 07/18/2018   Influenza,inj,Quad PF,6+ Mos 07/14/2019   Influenza-Unspecified 08/11/2015, 07/17/2016, 06/21/2017, 05/25/2018, 08/12/2020   Moderna Sars-Covid-2 Vaccination 11/16/2018, 11/16/2018   PFIZER Comirnaty(Gray Top)Covid-19 Tri-Sucrose Vaccine 03/09/2021   PFIZER(Purple Top)SARS-COV-2 Vaccination 12/18/2019, 01/08/2020, 08/19/2020   PNEUMOCOCCAL CONJUGATE-20 03/09/2021   Pneumococcal Conjugate-13 09/26/2013   Pneumococcal Polysaccharide-23 10/25/2006, 09/16/2010   Td 05/27/2009   Tdap 11/07/2019   Zoster Recombinat (Shingrix) 12/19/2018, 05/27/2019   Zoster, Live 10/25/2006  There are no preventive care reminders to display for this patient.    Past Medical History:  Diagnosis Date   Abdominal pain, epigastric    Allergic rhinitis, cause unspecified    Arthritis    BPH (benign prostatic hyperplasia)    CHF (congestive  heart failure) (HCC)    Chronic anticoagulation    xarelto   Coronary artery calcification seen on CAT scan 12/26/2012   Depressive disorder, not elsewhere classified    Diverticulosis of colon (without mention of hemorrhage)    Esophageal reflux    High risk medication use    Hyperlipidemia    Hyperplastic rectal polyp    Hypertension    Lumbago    Pacemaker    Paralyzed hemidiaphragm    LEFT   Persistent atrial fibrillation [facility])    Sick sinus syndrome [facility]) July 2010   s/p PPM [facility]) by [name]   Past Surgical History:  Procedure Laterality Date   ESOPHAGOGASTRODUODENOSCOPY N/A 12/05/2013   Procedure: ESOPHAGOGASTRODUODENOSCOPY (EGD);  Surgeon: [name], MD;  Location: [facility];  Service: Endoscopy;  Laterality: N/A;   ESOPHAGOGASTRODUODENOSCOPY N/A 07/04/2015   Procedure: ESOPHAGOGASTRODUODENOSCOPY (EGD);  Surgeon: [name], MD;  Location: [place];  Service: Endoscopy;  Laterality: N/A;   INSERT / REPLACE / REMOVE PACEMAKER  05/2009    [employer] ALTRUA 60  (serial number [id number]) dual-chamber pacemaker   LEAD REVISION/REPAIR N/A 10/15/2020   Procedure: LEAD REVISION/REPAIR;  Surgeon: Thompson Grayer, MD;  Location: [place] [facility];  Service: Cardiovascular;  Laterality: N/A;   PACEMAKER PLACEMENT Bilateral    '05   PPM GENERATOR CHANGEOUT N/A 10/15/2020   Procedure: PPM GENERATOR CHANGEOUT;  Surgeon: Thompson Grayer, MD;  Location: [place] [facility];  Service: Cardiovascular;  Laterality: N/A;    reports that he quit smoking about 29 years ago. His  smoking use included cigarettes. He has a 10.00 pack-year smoking history. He has quit using smokeless tobacco.  His smokeless tobacco use included chew. He reports that he does not drink alcohol and does not use drugs. family history includes Arthritis in his paternal grandmother; Asthma in his sister; Cancer in his father; Diabetes in his mother; Hypertension in his brother, daughter, father,  mother, sister, and son; Lung cancer in his father. No Known Allergies Current Outpatient Medications on File Prior to Visit  Medication Sig Dispense Refill   acetaminophen (TYLENOL) 650 MG CR tablet Take 1,300 mg by mouth in the morning and at bedtime.     amLODipine (NORVASC) 5 MG tablet TAKE 1 TABLET BY MOUTH EVERY DAY 30 tablet 0   atorvastatin (LIPITOR) 80 MG tablet TAKE 1 TABLET BY MOUTH EVERY DAY 90 tablet 3   Cholecalciferol (VITAMIN D3) 50 MCG (2000 UT) TABS Take 2,000 Units by mouth daily.     clobetasol cream (TEMOVATE) 0.05 % APPLY TO AFFECTED AREA TWICE A DAY 30 g 2   dorzolamide-timolol (COSOPT) 22.3-6.8 MG/ML ophthalmic solution Place 2 drops into both eyes in the morning and at bedtime.     fexofenadine (ALLEGRA) 180 MG tablet TAKE 1 TABLET (180 MG TOTAL) BY MOUTH DAILY. 90 tablet 1   flecainide (TAMBOCOR) 100 MG tablet Take 1 tablet (100 mg total) by mouth 2 (two) times daily. 180 tablet 3   latanoprost (XALATAN) 0.005 % ophthalmic solution Place 1 drop into both eyes at bedtime.      meclizine (ANTIVERT) 12.5 MG tablet TAKE 1 TABLET BY MOUTH THREE TIMES A DAY AS NEEDED FOR DIZZINESS 30 tablet 2   omeprazole (PRILOSEC) 40 MG capsule Take 1 capsule by mouth daily 90 capsule 3   oxybutynin (DITROPAN-XL) 10 MG 24 hr tablet TAKE 1 TABLET (10 MG TOTAL) BY MOUTH DAILY. 90 tablet 1   psyllium (METAMUCIL) 0.52 g capsule Take 0.52 g by mouth in the morning and at bedtime.     rivaroxaban (XARELTO) 20 MG TABS tablet TAKE 1 TABLET (20 MG TOTAL) BY MOUTH DAILY WITH SUPPER. 90 tablet 1   tamsulosin (FLOMAX) 0.4 MG CAPS capsule TAKE 1 CAPSULE BY MOUTH TWICE A DAY 180 capsule 2   telmisartan (MICARDIS) 20 MG tablet TAKE 1 TABLET BY MOUTH EVERY DAY 90 tablet 3   traMADol (ULTRAM) 50 MG tablet TAKE 1 TABLET BY MOUTH EVERY 6 HOURS AS NEEDED FOR PAIN 120 tablet 2   No current facility-administered medications on file prior to visit.        ROS:  All others reviewed and negative.  Objective         PE:  BP 120/60   Pulse 74   Temp 97.8 F (36.6 C)   Wt 190 lb 6.4 oz (86.4 kg)   SpO2 96%   BMI 28.12 kg/m                 Constitutional: Pt appears in NAD               HENT: Head: NCAT.                Right Ear: External ear normal.                 Left Ear: External ear normal.                Eyes: . Pupils are equal, round, and reactive to light. Conjunctivae and EOM are normal  Nose: without d/c or deformity               Neck: Neck supple. Gross normal ROM               Cardiovascular: Normal rate and regular rhythm.                 Pulmonary/Chest: Effort normal and breath sounds without rales or wheezing.                Abd:  Soft, NT, ND, + BS, no organomegaly               Neurological: Pt is alert. At baseline orientation, motor grossly intact               Skin: Skin is warm. No rashes, no other new lesions, LE edema - none               Psychiatric: Pt behavior is normal without agitation   Micro: none  Cardiac tracings I have personally interpreted today:  none  Pertinent Radiological findings (summarize): none   Lab Results  Component Value Date   WBC 7.6 12/09/2020   HGB 15.3 12/09/2020   HCT 46.9 12/09/2020   PLT 183.0 12/09/2020   GLUCOSE 90 12/09/2020   CHOL 185 11/07/2019   TRIG 59.0 11/07/2019   HDL 75.00 11/07/2019   LDLDIRECT 183.4 12/26/2013   LDLCALC 98 11/07/2019   ALT 41 01/13/2020   AST 29 01/13/2020   NA 138 12/09/2020   K 3.9 12/09/2020   CL 102 12/09/2020   CREATININE 0.85 12/09/2020   BUN 17 12/09/2020   CO2 27 12/09/2020   TSH 0.86 05/28/2020   PSA 1.76 11/07/2019   INR 2.2 (H) 12/09/2020   HGBA1C 6.2 11/07/2019   Assessment/Plan:  [name] is a 80 y.o. Black or African American [2] male with  has a past medical history of Abdominal pain, epigastric, Allergic rhinitis, cause unspecified, Arthritis, BPH (benign prostatic hyperplasia), CHF (congestive heart failure) ([place]), Chronic anticoagulation, Coronary  artery calcification seen on CAT scan (12/26/2012), Depressive disorder, not elsewhere classified, Diverticulosis of colon (without mention of hemorrhage), Esophageal reflux, High risk medication use, Hyperlipidemia, Hyperplastic rectal polyp, Hypertension, Lumbago, Pacemaker, Paralyzed hemidiaphragm, Persistent atrial fibrillation ([place]), and Sick sinus syndrome [facility]) (July 2010).  Vitamin D deficiency Last vitamin D Lab Results  Component Value Date   VD25OH 22.25 (L) 11/07/2019   Low, to start oral replacement   Aortic atherosclerosis (HCC) Stable, to continue low chol diet, exercise, and current lipitor 80   Essential hypertension BP Readings from Last 3 Encounters:  05/20/21 120/60  04/21/21 122/68  01/15/21 (!) 144/90   Stable, pt to continue medical treatment norvasc, micardis   Hyperglycemia Lab Results  Component Value Date   HGBA1C 6.2 11/07/2019   Stable, pt to continue current medical treatment  - diet, wt control   Hyperlipidemia Lab Results  Component Value Date   LDLCALC 98 11/07/2019   Uncontrolled, goal ldl < 70, pt to continue current statin liptior 80 and lower chol diet as declines other change for now  Followup: No follow-ups on file.  Cathlean Cower, MD 05/22/2021 9:13 PM [facility]

## 2021-05-20 NOTE — Patient Instructions (Signed)
Please continue all other medications as before, and refills have been done if requested.  Please have the pharmacy call with any other refills you may need.  Please continue your efforts at being more active, low cholesterol diet, and weight control.  You are otherwise up to date with prevention measures today.  Please keep your appointments with your specialists as you may have planned  I think we can hold on more lab testing today  Please make an Appointment to return in 6 months, or sooner if needed

## 2021-05-22 ENCOUNTER — Encounter: Payer: Self-pay | Admitting: Internal Medicine

## 2021-05-22 NOTE — Assessment & Plan Note (Signed)
Stable, to continue low chol diet, exercise, and current lipitor 80

## 2021-05-22 NOTE — Assessment & Plan Note (Signed)
BP Readings from Last 3 Encounters:  05/20/21 120/60  04/21/21 122/68  01/15/21 (!) 144/90   Stable, pt to continue medical treatment norvasc, micardis

## 2021-05-22 NOTE — Assessment & Plan Note (Signed)
Lab Results  Component Value Date   HGBA1C 6.2 11/07/2019   Stable, pt to continue current medical treatment  - diet, wt control

## 2021-05-22 NOTE — Assessment & Plan Note (Signed)
Lab Results  Component Value Date   LDLCALC 98 11/07/2019   Uncontrolled, goal ldl < 70, pt to continue current statin liptior 80 and lower chol diet as declines other change for now

## 2021-06-06 ENCOUNTER — Other Ambulatory Visit: Payer: Self-pay | Admitting: Internal Medicine

## 2021-06-12 ENCOUNTER — Other Ambulatory Visit: Payer: Self-pay | Admitting: Internal Medicine

## 2021-06-15 NOTE — Telephone Encounter (Signed)
Please refill as per office routine med refill policy (all routine meds refilled for 3 mo or monthly per pt preference up to one year from last visit, then month to month grace period for 3 mo, then further med refills will have to be denied)  

## 2021-06-20 ENCOUNTER — Other Ambulatory Visit: Payer: Self-pay | Admitting: Internal Medicine

## 2021-07-08 ENCOUNTER — Other Ambulatory Visit: Payer: Self-pay | Admitting: Internal Medicine

## 2021-07-08 NOTE — Telephone Encounter (Signed)
Please refill as per office routine med refill policy (all routine meds refilled for 3 mo or monthly per pt preference up to one year from last visit, then month to month grace period for 3 mo, then further med refills will have to be denied)  

## 2021-07-10 ENCOUNTER — Ambulatory Visit (INDEPENDENT_AMBULATORY_CARE_PROVIDER_SITE_OTHER): Payer: Medicare Other

## 2021-07-10 DIAGNOSIS — I495 Sick sinus syndrome: Secondary | ICD-10-CM | POA: Diagnosis not present

## 2021-07-10 LAB — CUP PACEART REMOTE DEVICE CHECK
Battery Remaining Longevity: 174 mo
Battery Remaining Percentage: 100 %
Brady Statistic RA Percent Paced: 93 %
Brady Statistic RV Percent Paced: 0 %
Date Time Interrogation Session: 20220825025100
Implantable Lead Implant Date: 20100719
Implantable Lead Implant Date: 20211130
Implantable Lead Location: 753859
Implantable Lead Location: 753860
Implantable Lead Model: 4136
Implantable Lead Model: 7842
Implantable Lead Serial Number: 1055910
Implantable Lead Serial Number: 28594836
Implantable Pulse Generator Implant Date: 20211130
Lead Channel Impedance Value: 581 Ohm
Lead Channel Impedance Value: 714 Ohm
Lead Channel Setting Pacing Amplitude: 2 V
Lead Channel Setting Pacing Amplitude: 2.5 V
Lead Channel Setting Pacing Pulse Width: 0.4 ms
Lead Channel Setting Sensing Sensitivity: 2.5 mV
Pulse Gen Serial Number: 951809

## 2021-07-24 NOTE — Progress Notes (Signed)
Remote pacemaker transmission.   

## 2021-07-25 ENCOUNTER — Other Ambulatory Visit: Payer: Self-pay | Admitting: Internal Medicine

## 2021-08-08 ENCOUNTER — Other Ambulatory Visit: Payer: Self-pay

## 2021-08-08 ENCOUNTER — Ambulatory Visit (INDEPENDENT_AMBULATORY_CARE_PROVIDER_SITE_OTHER): Payer: Medicare Other

## 2021-08-08 DIAGNOSIS — Z23 Encounter for immunization: Secondary | ICD-10-CM | POA: Diagnosis not present

## 2021-08-08 NOTE — Progress Notes (Signed)
Pt given High Dose Flu vacc w/o any complications.

## 2021-08-22 DIAGNOSIS — N200 Calculus of kidney: Secondary | ICD-10-CM | POA: Diagnosis not present

## 2021-08-22 DIAGNOSIS — N201 Calculus of ureter: Secondary | ICD-10-CM | POA: Diagnosis not present

## 2021-09-01 ENCOUNTER — Other Ambulatory Visit: Payer: Self-pay

## 2021-09-01 ENCOUNTER — Encounter: Payer: Self-pay | Admitting: Internal Medicine

## 2021-09-01 ENCOUNTER — Ambulatory Visit (INDEPENDENT_AMBULATORY_CARE_PROVIDER_SITE_OTHER): Payer: Medicare Other | Admitting: Internal Medicine

## 2021-09-01 VITALS — BP 120/80 | HR 60 | Temp 98.8°F | Ht 69.0 in | Wt 191.8 lb

## 2021-09-01 DIAGNOSIS — E559 Vitamin D deficiency, unspecified: Secondary | ICD-10-CM

## 2021-09-01 DIAGNOSIS — R21 Rash and other nonspecific skin eruption: Secondary | ICD-10-CM

## 2021-09-01 DIAGNOSIS — J449 Chronic obstructive pulmonary disease, unspecified: Secondary | ICD-10-CM | POA: Diagnosis not present

## 2021-09-01 DIAGNOSIS — I10 Essential (primary) hypertension: Secondary | ICD-10-CM

## 2021-09-01 MED ORDER — FLUCONAZOLE 100 MG PO TABS: 100.0000 mg | ORAL_TABLET | Freq: Every day | ORAL | 0 refills | Status: DC

## 2021-09-01 MED ORDER — KETOCONAZOLE 2 % EX CREA: 1.0000 "application " | TOPICAL_CREAM | Freq: Every day | CUTANEOUS | 0 refills | Status: DC

## 2021-09-01 NOTE — Assessment & Plan Note (Addendum)
C/w fungal - for diflucan 100 qd x 7 days, keotonozole cr prn, and refer derm if not helpful,  to f/u any worsening symptoms or concerns

## 2021-09-01 NOTE — Progress Notes (Signed)
Patient ID: [name], male   DOB: 1941/05/22, 80 y.o.   MRN: [id number]        Chief Complaint: follow up rash, htn, copd, vit d def       HPI:  [name] is a 80 y.o. male here with c/o 2 wks onset itchy rash with large area extension to the post neck, bilateral upper back, upper chest, and bilateral arms; does tend to sweat some days with no frequency shirt changing.  Pt denies chest pain, increased sob or doe, wheezing, orthopnea, PND, increased LE swelling, palpitations, dizziness or syncope.   Pt denies polydipsia, polyuria, or new focal neuro s/s.  No taking vit d. Wt Readings from Last 3 Encounters:  09/01/21 191 lb 12.8 oz (87 kg)  05/22/21 190 lb 6.4 oz (86.4 kg)  05/20/21 190 lb 6.4 oz (86.4 kg)   BP Readings from Last 3 Encounters:  09/01/21 120/80  05/22/21 120/60  05/20/21 120/60         Past Medical History:  Diagnosis Date   Abdominal pain, epigastric    Allergic rhinitis, cause unspecified    Arthritis    BPH (benign prostatic hyperplasia)    CHF (congestive heart failure) (HCC)    Chronic anticoagulation    xarelto   Coronary artery calcification seen on CAT scan 12/26/2012   Depressive disorder, not elsewhere classified    Diverticulosis of colon (without mention of hemorrhage)    Esophageal reflux    High risk medication use    Hyperlipidemia    Hyperplastic rectal polyp    Hypertension    Lumbago    Pacemaker    Paralyzed hemidiaphragm    LEFT   Persistent atrial fibrillation [facility])    Sick sinus syndrome [facility]) July 2010   s/p PPM [facility]) by [name]   Past Surgical History:  Procedure Laterality Date   ESOPHAGOGASTRODUODENOSCOPY N/A 12/05/2013   Procedure: ESOPHAGOGASTRODUODENOSCOPY (EGD);  Surgeon: [name], MD;  Location[facility];  Service: Endoscopy;  Laterality: N/A;   ESOPHAGOGASTRODUODENOSCOPY N/A 07/04/2015   Procedure: ESOPHAGOGASTRODUODENOSCOPY (EGD);  Surgeon: [name], MD;  Location: [place];  Service:  Endoscopy;  Laterality: N/A;   INSERT / REPLACE / REMOVE PACEMAKER  05/2009    [employer] ALTRUA 60  (serial number [id number]) dual-chamber pacemaker   LEAD REVISION/REPAIR N/A 10/15/2020   Procedure: LEAD REVISION/REPAIR;  Surgeon: Thompson Grayer, MD;  Location: [place] [facility];  Service: Cardiovascular;  Laterality: N/A;   PACEMAKER PLACEMENT Bilateral    '05   PPM GENERATOR CHANGEOUT N/A 10/15/2020   Procedure: PPM GENERATOR CHANGEOUT;  Surgeon: Thompson Grayer, MD;  Location: [place] [facility];  Service: Cardiovascular;  Laterality: N/A;    reports that he quit smoking about 29 years ago. His smoking use included cigarettes. He has a 10.00 pack-year smoking history. He has quit using smokeless tobacco.  His smokeless tobacco use included chew. He reports that he does not drink alcohol and does not use drugs. family history includes Arthritis in his paternal grandmother; Asthma in his sister; Cancer in his father; Diabetes in his mother; Hypertension in his brother, daughter, father, mother, sister, and son; Lung cancer in his father. No Known Allergies Current Outpatient Medications on File Prior to Visit  Medication Sig Dispense Refill   acetaminophen (TYLENOL) 650 MG CR tablet Take 1,300 mg by mouth in the morning and at bedtime.     amLODipine (NORVASC) 5 MG tablet TAKE 1 TABLET BY MOUTH EVERY  DAY 90 tablet 1   atorvastatin (LIPITOR) 80 MG tablet TAKE 1 TABLET BY MOUTH EVERY DAY 90 tablet 3   Cholecalciferol (VITAMIN D3) 50 MCG (2000 UT) TABS Take 2,000 Units by mouth daily.     clobetasol cream (TEMOVATE) 0.05 % APPLY TO AFFECTED AREA TWICE A DAY 30 g 2   dorzolamide-timolol (COSOPT) 22.3-6.8 MG/ML ophthalmic solution Place 2 drops into both eyes in the morning and at bedtime.     fexofenadine (ALLEGRA) 180 MG tablet TAKE 1 TABLET (180 MG TOTAL) BY MOUTH DAILY. 90 tablet 1   flecainide (TAMBOCOR) 100 MG tablet Take 1 tablet (100 mg total) by mouth 2 (two) times daily. 180 tablet 3    latanoprost (XALATAN) 0.005 % ophthalmic solution Place 1 drop into both eyes at bedtime.      meclizine (ANTIVERT) 12.5 MG tablet TAKE 1 TABLET BY MOUTH THREE TIMES A DAY AS NEEDED FOR DIZZINESS 30 tablet 2   omeprazole (PRILOSEC) 40 MG capsule Take 1 capsule by mouth daily 90 capsule 3   oxybutynin (DITROPAN-XL) 10 MG 24 hr tablet TAKE 1 TABLET (10 MG TOTAL) BY MOUTH DAILY. 90 tablet 1   psyllium (METAMUCIL) 0.52 g capsule Take 0.52 g by mouth in the morning and at bedtime.     rivaroxaban (XARELTO) 20 MG TABS tablet TAKE 1 TABLET (20 MG TOTAL) BY MOUTH DAILY WITH SUPPER. 90 tablet 1   tamsulosin (FLOMAX) 0.4 MG CAPS capsule TAKE 1 CAPSULE BY MOUTH TWICE A DAY 180 capsule 2   telmisartan (MICARDIS) 20 MG tablet TAKE 1 TABLET BY MOUTH EVERY DAY 90 tablet 3   traMADol (ULTRAM) 50 MG tablet TAKE 1 TABLET BY MOUTH EVERY 6 HOURS AS NEEDED FOR PAIN 120 tablet 2   No current facility-administered medications on file prior to visit.        ROS:  All others reviewed and negative.  Objective        PE:  BP 120/80 (BP Location: Right Arm, Patient Position: Sitting, Cuff Size: Large)   Pulse 60   Temp 98.8 F (37.1 C) (Oral)   Ht 5\' 9"  (1.753 m)   Wt 191 lb 12.8 oz (87 kg)   SpO2 97%   BMI 28.32 kg/m                 Constitutional: Pt appears in NAD               HENT: Head: NCAT.                Right Ear: External ear normal.                 Left Ear: External ear normal.                Eyes: . Pupils are equal, round, and reactive to light. Conjunctivae and EOM are normal               Nose: without d/c or deformity               Neck: Neck supple. Gross normal ROM               Cardiovascular: Normal rate and regular rhythm.                 Pulmonary/Chest: Effort normal and breath sounds without rales or wheezing.                Abd:  Soft, NT, ND, + BS,  no organomegaly               Neurological: Pt is alert. At baseline orientation, motor grossly intact               Skin:  LE  edema - none, large area lightened skin to post neck, upper back, upper chest and bilat arms at the elbows               Psychiatric: Pt behavior is normal without agitation   Micro: none  Cardiac tracings I have personally interpreted today:  none  Pertinent Radiological findings (summarize): none   Lab Results  Component Value Date   WBC 7.6 12/09/2020   HGB 15.3 12/09/2020   HCT 46.9 12/09/2020   PLT 183.0 12/09/2020   GLUCOSE 90 12/09/2020   CHOL 185 11/07/2019   TRIG 59.0 11/07/2019   HDL 75.00 11/07/2019   LDLDIRECT 183.4 12/26/2013   LDLCALC 98 11/07/2019   ALT 41 01/13/2020   AST 29 01/13/2020   NA 138 12/09/2020   K 3.9 12/09/2020   CL 102 12/09/2020   CREATININE 0.85 12/09/2020   BUN 17 12/09/2020   CO2 27 12/09/2020   TSH 0.86 05/28/2020   PSA 1.76 11/07/2019   INR 2.2 (H) 12/09/2020   HGBA1C 6.2 11/07/2019   Assessment/Plan:  KADEN DAUGHDRILL is a 80 y.o. Black or African American [2] male with  has a past medical history of Abdominal pain, epigastric, Allergic rhinitis, cause unspecified, Arthritis, BPH (benign prostatic hyperplasia), CHF (congestive heart failure) ([place]), Chronic anticoagulation, Coronary artery calcification seen on CAT scan (12/26/2012), Depressive disorder, not elsewhere classified, Diverticulosis of colon (without mention of hemorrhage), Esophageal reflux, High risk medication use, Hyperlipidemia, Hyperplastic rectal polyp, Hypertension, Lumbago, Pacemaker, Paralyzed hemidiaphragm, Persistent atrial fibrillation ([place]), and Sick sinus syndrome [facility]) (July 2010).  Vitamin D deficiency Last vitamin D Lab Results  Component Value Date   VD25OH 22.25 (L) 11/07/2019   Low reminded to start oral replacement   COPD GOLD III with marked reversibility  Stable overall, for contd albuerol hfa prn,  to f/u any worsening symptoms or concerns  Essential hypertension BP Readings from Last 3 Encounters:  09/01/21 120/80  05/22/21 120/60  05/20/21 120/60    Stable, pt to continue medical treatment norvasc, micardis   Rash C/w fungal - for diflucan 100 qd x 7 days, keotonozole cr prn, and refer derm if not helpful,  to f/u any worsening symptoms or concerns  Followup: Return if symptoms worsen or fail to improve.  Cathlean Cower, MD 09/01/2021 9:03 PM [facility]

## 2021-09-01 NOTE — Patient Instructions (Signed)
Please take all new medication as prescribed - the fungal medication, as well as the cream as needed  You will be contacted regarding the referral for: Dermatology  Please continue all other medications as before, and refills have been done if requested.  Please have the pharmacy call with any other refills you may need.  Please keep your appointments with your specialists as you may have planned

## 2021-09-01 NOTE — Assessment & Plan Note (Signed)
Stable overall, for contd albuerol hfa prn,  to f/u any worsening symptoms or concerns

## 2021-09-01 NOTE — Assessment & Plan Note (Signed)
Last vitamin D Lab Results  Component Value Date   VD25OH 22.25 (L) 11/07/2019   Low reminded to start oral replacement

## 2021-09-01 NOTE — Assessment & Plan Note (Signed)
BP Readings from Last 3 Encounters:  09/01/21 120/80  05/22/21 120/60  05/20/21 120/60   Stable, pt to continue medical treatment norvasc, micardis

## 2021-09-03 NOTE — Progress Notes (Addendum)
Cardiology Office Note Date:  09/04/2021  Patient ID:  [name], [name] Apr 14, 1941, MRN [id number] PCP:  Biagio Borg, MD  Electrophysiologist: Dr. Rayann Heman    Chief Complaint:   6 mo  History of Present Illness: [name] is a 80 y.o. male with history of HTN, HLD, paralyzed L hemidiaphragm, AFib, SSSx w/PPM.  He comes in today to be seen for Dr. Rayann Heman, last seen by him Nov 2021, at that time discussed chrinic SOB/fatigue with negative ischemic w/u and stable echo.  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  This has been going on at least a year but has gotten worse. No rest SOB, no CP, no palpitations No dizzy spells, near syncope or syncope. No bleeding or signs of bleeding  He reports he has seen pulmonology and they prescribed an inhaler to use and to exercise, but the inhaler did not help and he gets too SOB to exercise.   Device information BSCi dual chamber PPM implant 06/03/2009 gen change 10/15/2020 with new RV lead implant HE HAS AN ABANDONED RV LEAD   Past Medical History:  Diagnosis Date   Abdominal pain, epigastric    Allergic rhinitis, cause unspecified    Arthritis    BPH (benign prostatic hyperplasia)    CHF (congestive heart failure) (HCC)    Chronic anticoagulation    xarelto   Coronary artery calcification seen on CAT scan 12/26/2012   Depressive disorder, not elsewhere classified    Diverticulosis of colon (without mention of hemorrhage)    Esophageal reflux    High risk medication use    Hyperlipidemia    Hyperplastic rectal polyp    Hypertension    Lumbago    Pacemaker    Paralyzed hemidiaphragm    LEFT   Persistent atrial fibrillation [facility])    Sick sinus syndrome [facility]) July 2010   s/p PPM [facility] [employer]) by [name]    Past Surgical History:  Procedure Laterality Date   ESOPHAGOGASTRODUODENOSCOPY N/A 12/05/2013   Procedure:  ESOPHAGOGASTRODUODENOSCOPY (EGD);  Surgeon: [name], MD;  Location: [facility];  Service: Endoscopy;  Laterality: N/A;   ESOPHAGOGASTRODUODENOSCOPY N/A 07/04/2015   Procedure: ESOPHAGOGASTRODUODENOSCOPY (EGD);  Surgeon: [name], MD;  Location: [place];  Service: Endoscopy;  Laterality: N/A;   INSERT / REPLACE / REMOVE PACEMAKER  05/2009    [employer] ALTRUA 60  (serial number [id number]) dual-chamber pacemaker   LEAD REVISION/REPAIR N/A 10/15/2020   Procedure: LEAD REVISION/REPAIR;  Surgeon: Thompson Grayer, MD;  Location: [place] [facility];  Service: Cardiovascular;  Laterality: N/A;   PACEMAKER PLACEMENT Bilateral    '05   PPM GENERATOR CHANGEOUT N/A 10/15/2020   Procedure: PPM GENERATOR CHANGEOUT;  Surgeon: Thompson Grayer, MD;  Location: [place] [facility];  Service: Cardiovascular;  Laterality: N/A;    Current Outpatient Medications  Medication Sig Dispense Refill   acetaminophen (TYLENOL) 650 MG CR tablet Take 1,300 mg by mouth in the morning and at bedtime.     amLODipine (NORVASC) 5 MG tablet TAKE 1 TABLET BY MOUTH EVERY DAY 90 tablet 1   atorvastatin (LIPITOR) 80 MG tablet TAKE 1 TABLET BY MOUTH EVERY DAY 90 tablet 3   Cholecalciferol (VITAMIN D3) 50 MCG (2000 UT) TABS Take 2,000 Units by mouth daily.     clobetasol cream (TEMOVATE) 0.05 % APPLY TO AFFECTED AREA TWICE A  DAY 30 g 2   dorzolamide-timolol (COSOPT) 22.3-6.8 MG/ML ophthalmic solution Place 2 drops into both eyes in the morning and at bedtime.     fexofenadine (ALLEGRA) 180 MG tablet TAKE 1 TABLET (180 MG TOTAL) BY MOUTH DAILY. 90 tablet 1   flecainide (TAMBOCOR) 100 MG tablet Take 1 tablet (100 mg total) by mouth 2 (two) times daily. 180 tablet 3   fluconazole (DIFLUCAN) 100 MG tablet Take 1 tablet (100 mg total) by mouth daily. 7 tablet 0   ketoconazole (NIZORAL) 2 % cream Apply 1 application topically daily. 15 g 0   latanoprost (XALATAN) 0.005 % ophthalmic solution Place 1 drop into both eyes at  bedtime.      meclizine (ANTIVERT) 12.5 MG tablet TAKE 1 TABLET BY MOUTH THREE TIMES A DAY AS NEEDED FOR DIZZINESS 30 tablet 2   metoprolol succinate (TOPROL XL) 25 MG 24 hr tablet Take 1 tablet (25 mg total) by mouth daily. 90 tablet 2   metoprolol tartrate (LOPRESSOR) 100 MG tablet Take 1 tablet (100 mg total) by mouth once for 1 dose. 2 hours prior to CT procedure 1 tablet 0   omeprazole (PRILOSEC) 40 MG capsule Take 1 capsule by mouth daily 90 capsule 3   oxybutynin (DITROPAN-XL) 10 MG 24 hr tablet TAKE 1 TABLET (10 MG TOTAL) BY MOUTH DAILY. 90 tablet 1   psyllium (METAMUCIL) 0.52 g capsule Take 0.52 g by mouth in the morning and at bedtime.     rivaroxaban (XARELTO) 20 MG TABS tablet TAKE 1 TABLET (20 MG TOTAL) BY MOUTH DAILY WITH SUPPER. 90 tablet 1   tamsulosin (FLOMAX) 0.4 MG CAPS capsule TAKE 1 CAPSULE BY MOUTH TWICE A DAY 180 capsule 2   telmisartan (MICARDIS) 20 MG tablet TAKE 1 TABLET BY MOUTH EVERY DAY 90 tablet 3   traMADol (ULTRAM) 50 MG tablet TAKE 1 TABLET BY MOUTH EVERY 6 HOURS AS NEEDED FOR PAIN 120 tablet 2   No current facility-administered medications for this visit.    Allergies:   Patient has no known allergies.   Social History:  The patient  reports that he quit smoking about 29 years ago. His smoking use included cigarettes. He has a 10.00 pack-year smoking history. He has quit using smokeless tobacco.  His smokeless tobacco use included chew. He reports that he does not drink alcohol and does not use drugs.   Family History:  The patient's family history includes Arthritis in his paternal grandmother; Asthma in his sister; Cancer in his father; Diabetes in his mother; Hypertension in his brother, daughter, father, mother, sister, and son; Lung cancer in his father.  ROS:  Please see the history of present illness.    All other systems are reviewed and otherwise negative.   PHYSICAL EXAM:  VS:  BP 120/86   Pulse 65   Ht 5\' 9"  (1.753 m)   Wt 167 lb 12.8 oz (76.1  kg)   SpO2 97%   BMI 24.78 kg/m  BMI: Body mass index is 24.78 kg/m. Well nourished, well developed, in no acute distress HEENT: normocephalic, atraumatic Neck: no JVD, carotid bruits or masses Cardiac:   RRR; no significant murmurs, no rubs, or gallops Lungs:  CTA b/l, no wheezing, rhonchi or rales Abd: soft, nontender MS: no deformity or atrophy Ext: no edema Skin: warm and dry, no rash Neuro:  No gross deficits appreciated Psych: euthymic mood, full affect  PPM site is stable, no tethering or discomfort   EKG:  Done today and  reviewed by myself shows  AP/VS 65bpm  Device interrogation done today and reviewed by myself:  Battery and lead measurements are good No arrhythmias 93%AP, <1%VP  04/18/2020: TTE IMPRESSIONS   1. Normal GLS -15.4 Abnormal septal motion inferior basal hypokinesis .  Left ventricular ejection fraction, by estimation, is 50 to 55%. The left  ventricle has low normal function. The left ventricle demonstrates  regional wall motion abnormalities (see  scoring diagram/findings for description). The left ventricular internal  cavity size was mildly dilated. Left ventricular diastolic parameters were  normal.   2. Pacing wires in RA/RV. Right ventricular systolic function is normal.  The right ventricular size is normal. There is normal pulmonary artery  systolic pressure.   3. Right atrial size was mildly dilated.   4. The mitral valve is normal in structure. Trivial mitral valve  regurgitation. No evidence of mitral stenosis.   5. Tricuspid valve regurgitation is mild to moderate.   6. The aortic valve is tricuspid. Aortic valve regurgitation is not  visualized. Mild aortic valve sclerosis is present, with no evidence of  aortic valve stenosis.   7. The inferior vena cava is normal in size with greater than 50%  respiratory variability, suggesting right atrial pressure of 3 mmHg.   Comparison(s): 05/08/13 EF 55%.    04/18/2020: stress myoview The  left ventricular ejection fraction is normal (55-65%). Nuclear stress EF: 55%. There was no ST segment deviation noted during stress. Defect 1: There is a small defect of severe severity present in the apex location. This is a low risk study.   Normal stress nuclear study with apical thinning but no ischemia.  Gated ejection fraction 55% with normal wall motion.  Recent Labs: 09/04/2021: BUN 14; Creatinine, Ser 0.82; Hemoglobin 13.9; Platelets 184; Potassium 4.5; Sodium 140  No results found for requested labs within last 8760 hours.   Estimated Creatinine Clearance: 74.2 mL/min (by C-G formula based on SCr of 0.82 mg/dL).   Wt Readings from Last 3 Encounters:  09/04/21 167 lb 12.8 oz (76.1 kg)  09/01/21 191 lb 12.8 oz (87 kg)  05/22/21 190 lb 6.4 oz (86.4 kg)     Other studies reviewed: Additional studies/records reviewed today include: summarized above  ASSESSMENT AND PLAN:  PPM Intact function No programming changes made  paroxysmal Afib CHA2DS2Vasc is 3, on 3 xarelto, appropriately dosed On flecainide 0% burden He is 93% AP, he is AP at 40,  Given flecainide, will add low dose Toprol, BP has room  HTN Looks ok  SOB/fatigue Chronic Discussed paralyzed L hemidiaphragm likely plays a role and pulmonary note mentioned deconditioning "clearly plays a role" He was given breo and advised to start to exercise. The patient is not at all convinced and is worried there is something else going on Stress test last year without ischemia I have discussed with cardiologist in the office today, despite pacing wires, can still get adequate images with coronary CT  I will start there, but also recommended that he start to walk for exercise He walked a lap around the office today O2 sats 91-94% Not overtly SOB at completion   Disposition: F/u with [place] in 42mo, sooner if needed, remotes as usual   Current medicines are reviewed at length with the patient today.  The patient did  not have any concerns regarding medicines.  Venetia Night, PA-C 09/04/2021 5:53 PM     [address] [place] [address] [phone] (office)  [phone] (fax)

## 2021-09-04 ENCOUNTER — Encounter: Payer: Self-pay | Admitting: Physician Assistant

## 2021-09-04 ENCOUNTER — Other Ambulatory Visit: Payer: Self-pay

## 2021-09-04 ENCOUNTER — Ambulatory Visit (INDEPENDENT_AMBULATORY_CARE_PROVIDER_SITE_OTHER): Payer: Medicare Other | Admitting: Physician Assistant

## 2021-09-04 VITALS — BP 120/86 | HR 65 | Ht 69.0 in | Wt 167.8 lb

## 2021-09-04 DIAGNOSIS — I480 Paroxysmal atrial fibrillation: Secondary | ICD-10-CM | POA: Diagnosis not present

## 2021-09-04 DIAGNOSIS — Z950 Presence of cardiac pacemaker: Secondary | ICD-10-CM

## 2021-09-04 DIAGNOSIS — Z79899 Other long term (current) drug therapy: Secondary | ICD-10-CM | POA: Diagnosis not present

## 2021-09-04 DIAGNOSIS — R0602 Shortness of breath: Secondary | ICD-10-CM | POA: Diagnosis not present

## 2021-09-04 LAB — BASIC METABOLIC PANEL
BUN/Creatinine Ratio: 17 (ref 10–24)
BUN: 14 mg/dL (ref 8–27)
CO2: 27 mmol/L (ref 20–29)
Calcium: 9.8 mg/dL (ref 8.6–10.2)
Chloride: 105 mmol/L (ref 96–106)
Creatinine, Ser: 0.82 mg/dL (ref 0.76–1.27)
Glucose: 103 mg/dL — ABNORMAL HIGH (ref 70–99)
Potassium: 4.5 mmol/L (ref 3.5–5.2)
Sodium: 140 mmol/L (ref 134–144)
eGFR: 90 mL/min/{1.73_m2} (ref >59)

## 2021-09-04 LAB — CUP PACEART INCLINIC DEVICE CHECK
Date Time Interrogation Session: 20221020193017
Implantable Lead Implant Date: 20100719
Implantable Lead Implant Date: 20211130
Implantable Lead Location: 753859
Implantable Lead Location: 753860
Implantable Lead Model: 4136
Implantable Lead Model: 7842
Implantable Lead Serial Number: 1055910
Implantable Lead Serial Number: 28594836
Implantable Pulse Generator Implant Date: 20211130
Lead Channel Impedance Value: 614 Ohm
Lead Channel Impedance Value: 721 Ohm
Lead Channel Pacing Threshold Amplitude: 0.9 V
Lead Channel Pacing Threshold Amplitude: 1 V
Lead Channel Pacing Threshold Pulse Width: 0.4 ms
Lead Channel Pacing Threshold Pulse Width: 1 ms
Lead Channel Sensing Intrinsic Amplitude: 7.8 mV
Lead Channel Setting Pacing Amplitude: 2 V
Lead Channel Setting Pacing Amplitude: 2.5 V
Lead Channel Setting Pacing Pulse Width: 1 ms
Lead Channel Setting Sensing Sensitivity: 2.5 mV
Pulse Gen Serial Number: 951809

## 2021-09-04 LAB — CBC
Hematocrit: 40.4 % (ref 37.5–51.0)
Hemoglobin: 13.9 g/dL (ref 13.0–17.7)
MCH: 30.8 pg (ref 26.6–33.0)
MCHC: 34.4 g/dL (ref 31.5–35.7)
MCV: 90 fL (ref 79–97)
Platelets: 184 10*3/uL (ref 150–450)
RBC: 4.51 x10E6/uL (ref 4.14–5.80)
RDW: 13.7 % (ref 11.6–15.4)
WBC: 6.2 10*3/uL (ref 3.4–10.8)

## 2021-09-04 MED ORDER — METOPROLOL SUCCINATE ER 25 MG PO TB24: 25.0000 mg | ORAL_TABLET | Freq: Every day | ORAL | 2 refills | Status: DC

## 2021-09-04 MED ORDER — METOPROLOL TARTRATE 100 MG PO TABS: 100.0000 mg | ORAL_TABLET | Freq: Once | ORAL | 0 refills | Status: DC

## 2021-09-04 NOTE — Patient Instructions (Addendum)
Medication Instructions:   START TAKING TOPROL XL 25 MG ONCE A DAY   *If you need a refill on your cardiac medications before your next appointment, please call your pharmacy*   Lab Work: BMET AND CBC TODAY   If you have labs (blood work) drawn today and your tests are completely normal, you will receive your results only by: [place] (if you have MyChart) OR A paper copy in the mail If you have any lab test that is abnormal or we need to change your treatment, we will call you to review the results.   Testing/Procedures: Non-Cardiac CT Angiography (CTA), is a special type of CT scan that uses a computer to produce multi-dimensional views of major blood vessels throughout the body. In CT angiography, a contrast material is injected through an IV to help visualize the blood vessels    Follow-Up: At [facility], you and your health needs are our priority.  As part of our continuing mission to provide you with exceptional heart care, we have created designated Provider Care Teams.  These Care Teams include your primary Cardiologist (physician) and Advanced Practice Providers (APPs -  Physician Assistants and Nurse Practitioners) who all work together to provide you with the care you need, when you need it.  We recommend signing up for the patient portal called "MyChart".  Sign up information is provided on this After Visit Summary.  MyChart is used to connect with patients for Virtual Visits (Telemedicine).  Patients are able to view lab/test results, encounter notes, upcoming appointments, etc.  Non-urgent messages can be sent to your provider as well.   To learn more about what you can do with MyChart, go to [email or web address].    Your next appointment:   6 month(s)  The format for your next appointment:   In Person  Provider:   You will see one of the following Advanced Practice Providers on your designated Care Team:   Tommye Standard, [place]    Other  Instructions     Your cardiac CT will be scheduled at one of the below locations:   [facility] [address] [place], [place] [address] [phone]  [place] [address] [place], [place] [address] [phone]  If scheduled at [facility], please arrive at the [facility] main entrance (entrance A) of [facility] 30 minutes prior to test start time. You can use the FREE valet parking offered at the main entrance (encouraged to control the heart rate for the test) Proceed to the [facility] Radiology Department (first floor) to check-in and test prep.  If scheduled at [facility], please arrive 15 mins early for check-in and test prep.  Please follow these instructions carefully (unless otherwise directed):  Hold all erectile dysfunction medications at least 3 days (72 hrs) prior to test.  On the Night Before the Test: Be sure to Drink plenty of water. Do not consume any caffeinated/decaffeinated beverages or chocolate 12 hours prior to your test. Do not take any antihistamines 12 hours prior to your test.    On the Day of the Test: Drink plenty of water until 1 hour prior to the test. Do not eat any food 4 hours prior to the test. You may take your regular medications prior to the test.  Take metoprolol (Lopressor) two hours prior to test. HOLD Furosemide/Hydrochlorothiazide morning of the test.  After the Test: Drink plenty of water. After  receiving IV contrast, you may experience a mild flushed feeling. This is normal. On occasion, you may experience a mild rash up to 24 hours after the test. This is not dangerous. If this occurs, you can take Benadryl 25 mg and increase your fluid intake. If you experience trouble breathing, this can be serious. If it is severe call 911 IMMEDIATELY. If it is mild, please call our office. If you take any of these  medications: Glipizide/Metformin, Avandament, Glucavance, please do not take 48 hours after completing test unless otherwise instructed.  Please allow 2-4 weeks for scheduling of routine cardiac CTs. Some insurance companies require a pre-authorization which may delay scheduling of this test.   For non-scheduling related questions, please contact the cardiac imaging nurse navigator should you have any questions/concerns: Marchia Bond, Cardiac Imaging Nurse Navigator Gordy Clement, Cardiac Imaging Nurse Navigator [facility] Direct Office Dial: [phone]   For scheduling needs, including cancellations and rescheduling, please call [place], [phone].

## 2021-09-08 ENCOUNTER — Other Ambulatory Visit: Payer: Self-pay | Admitting: Internal Medicine

## 2021-09-08 DIAGNOSIS — K5730 Diverticulosis of large intestine without perforation or abscess without bleeding: Secondary | ICD-10-CM | POA: Diagnosis not present

## 2021-09-08 DIAGNOSIS — N132 Hydronephrosis with renal and ureteral calculous obstruction: Secondary | ICD-10-CM | POA: Diagnosis not present

## 2021-09-08 DIAGNOSIS — N201 Calculus of ureter: Secondary | ICD-10-CM | POA: Diagnosis not present

## 2021-09-10 DIAGNOSIS — R310 Gross hematuria: Secondary | ICD-10-CM | POA: Diagnosis not present

## 2021-09-10 DIAGNOSIS — N201 Calculus of ureter: Secondary | ICD-10-CM | POA: Diagnosis not present

## 2021-09-12 ENCOUNTER — Telehealth (HOSPITAL_COMMUNITY): Payer: Self-pay | Admitting: *Deleted

## 2021-09-12 NOTE — Telephone Encounter (Signed)
Reaching out to patient to offer assistance regarding upcoming cardiac imaging study; pt verbalizes understanding of appt date/time, parking situation and where to check in, pre-test NPO status and medications ordered, and verified current allergies; name and call back number provided for further questions should they arise  Gordy Clement RN Navigator [facility] [phone] office [phone] cell  Patient to hold metoprolol tartrate if HR is 65bpm or less.

## 2021-09-15 ENCOUNTER — Other Ambulatory Visit: Payer: Self-pay

## 2021-09-15 ENCOUNTER — Ambulatory Visit (HOSPITAL_COMMUNITY)
Admission: RE | Admit: 2021-09-15 | Discharge: 2021-09-15 | Disposition: A | Discharge status: Home / Self Care | Payer: Medicare Other | Source: Ambulatory Visit | Attending: Physician Assistant | Admitting: Physician Assistant

## 2021-09-15 ENCOUNTER — Other Ambulatory Visit (HOSPITAL_COMMUNITY): Payer: Self-pay | Admitting: Emergency Medicine

## 2021-09-15 ENCOUNTER — Other Ambulatory Visit: Payer: Self-pay | Admitting: Internal Medicine

## 2021-09-15 DIAGNOSIS — R931 Abnormal findings on diagnostic imaging of heart and coronary circulation: Secondary | ICD-10-CM

## 2021-09-15 DIAGNOSIS — I480 Paroxysmal atrial fibrillation: Secondary | ICD-10-CM | POA: Diagnosis not present

## 2021-09-15 DIAGNOSIS — R079 Chest pain, unspecified: Secondary | ICD-10-CM

## 2021-09-15 DIAGNOSIS — R0602 Shortness of breath: Secondary | ICD-10-CM | POA: Insufficient documentation

## 2021-09-15 MED ORDER — NITROGLYCERIN 0.4 MG SL SUBL
SUBLINGUAL_TABLET | SUBLINGUAL | Status: AC
  Filled 2021-09-15: qty 2

## 2021-09-15 MED ORDER — IOHEXOL 350 MG/ML SOLN
95.0000 mL | Freq: Once | INTRAVENOUS | Status: AC | PRN
  Administered 2021-09-15: 95 mL via INTRAVENOUS

## 2021-09-15 MED ORDER — NITROGLYCERIN 0.4 MG SL SUBL
0.8000 mg | SUBLINGUAL_TABLET | Freq: Once | SUBLINGUAL | Status: AC
  Administered 2021-09-15: 0.8 mg via SUBLINGUAL

## 2021-09-16 DIAGNOSIS — R931 Abnormal findings on diagnostic imaging of heart and coronary circulation: Secondary | ICD-10-CM | POA: Diagnosis not present

## 2021-09-16 DIAGNOSIS — R0602 Shortness of breath: Secondary | ICD-10-CM | POA: Diagnosis not present

## 2021-09-16 DIAGNOSIS — I480 Paroxysmal atrial fibrillation: Secondary | ICD-10-CM | POA: Diagnosis not present

## 2021-09-22 ENCOUNTER — Telehealth: Payer: Self-pay | Admitting: *Deleted

## 2021-09-22 NOTE — Telephone Encounter (Signed)
Clinical pharmacist to review Xarelto.  Patient has a history of PAF

## 2021-09-22 NOTE — Telephone Encounter (Signed)
   Pre-operative Risk Assessment    Patient Name: [name]  DOB: 1941-10-18 MRN: [id number]      Request for Surgical Clearance   Procedure:   LEFT ESWL  Date of Surgery: Clearance TBD                                 Surgeon:  DR. Harold Barban Surgeon's Group or Practice Name:  [facility] Phone number:  [phone] Fax number:  [phone]   Type of Clearance Requested: - Medical  - Pharmacy:  Hold Rivaroxaban (Xarelto) 3 DAYS PRIOR TO PROCEDURE   Type of Anesthesia:   Not Indicated   Additional requests/questions:   Jiles Prows   09/22/2021, 9:37 AM

## 2021-09-22 NOTE — Telephone Encounter (Addendum)
Patient with diagnosis of a fib on Xarelto for anticoagulation.    Procedure: left extracorporeal shock wave lithotripsy Date of procedure: TBD  CHA2DS2-VASc Score = 5  This indicates a 7.2% annual risk of stroke. The patient's score is based upon: CHF History: 1 HTN History: 1 Diabetes History: 0 Stroke History: 0 Vascular Disease History: 1 Age Score: 2 Gender Score: 0  CrCl 80, Scr 0.82 (08/2021) Platelet count 184K (08/2021)  Should be minimal bleed risk procedure in which case 1-2 day Xarelto hold should suffice. Request is for a 3 day hold. If this is required by MD performing procedure, ok to hold for 3 days if required.

## 2021-09-23 NOTE — Telephone Encounter (Signed)
Left a message for the patient to call back and speak to the on-call preop APP of the day 

## 2021-09-24 NOTE — Telephone Encounter (Signed)
    Patient Name: [name]  DOB: [birth year] MRN: [id number]  Primary Cardiologist: Dr. Rayann Heman  Chart reviewed as part of pre-operative protocol coverage. Patient was last seen by Tommye Standard, PA-C, at which time he continued to report fatigue and dyspnea on exertion. Coronary CTA was ordered for further evaluation and showed extensive plaque but FFR was negative indicant no significant stenosis. Patient was contacted today for further pre-op evaluation and reported no significant changes since last visit. His dyspnea on exertion is stable. No chest pain, shortness of breath at rest, orthopnea, PND, palpitations, lightheadedness/dizziness, or syncope. Given past medical history and time since last visit, based on ACC/AHA guidelines, ASA BAUDOIN would be at acceptable risk for the planned procedure without further cardiovascular testing.   Per Pharmacy: "Should be minimal bleed risk procedure in which case 1-2 day Xarelto hold should suffice. Request is for a 3 day hold. If this is required by MD performing procedure, ok to hold for 3 days if required." Xarelto should be restarted as soon as safely possible following procedure.   I will route this recommendation to the requesting party via Epic fax function and remove from pre-op pool.  Please call with questions.  Darreld Mclean, PA-C 09/24/2021, 11:02 AM

## 2021-09-26 ENCOUNTER — Other Ambulatory Visit: Payer: Self-pay | Admitting: Urology

## 2021-09-26 DIAGNOSIS — D2371 Other benign neoplasm of skin of right lower limb, including hip: Secondary | ICD-10-CM | POA: Diagnosis not present

## 2021-09-26 DIAGNOSIS — B360 Pityriasis versicolor: Secondary | ICD-10-CM | POA: Diagnosis not present

## 2021-09-26 DIAGNOSIS — N201 Calculus of ureter: Secondary | ICD-10-CM

## 2021-10-07 NOTE — Progress Notes (Signed)
Talked with patient meds and hx reviewed. To stop Xarelto on Friday morning. And Voltran gel on Sat. Take B/P meds Monday morning with sip of water.  Clears liquids up to 0515. Arrival time 37 wife is the driver

## 2021-10-08 DIAGNOSIS — H2513 Age-related nuclear cataract, bilateral: Secondary | ICD-10-CM | POA: Diagnosis not present

## 2021-10-08 DIAGNOSIS — H401131 Primary open-angle glaucoma, bilateral, mild stage: Secondary | ICD-10-CM | POA: Diagnosis not present

## 2021-10-13 ENCOUNTER — Encounter (HOSPITAL_BASED_OUTPATIENT_CLINIC_OR_DEPARTMENT_OTHER)
Admission: RE | Disposition: A | Discharge status: Home / Self Care | Payer: Self-pay | Source: Ambulatory Visit | Attending: Urology

## 2021-10-13 ENCOUNTER — Ambulatory Visit (HOSPITAL_BASED_OUTPATIENT_CLINIC_OR_DEPARTMENT_OTHER)
Admission: RE | Admit: 2021-10-13 | Discharge: 2021-10-13 | Disposition: A | Discharge status: Home / Self Care | Payer: Medicare Other | Source: Ambulatory Visit | Attending: Urology | Admitting: Urology

## 2021-10-13 ENCOUNTER — Encounter (HOSPITAL_BASED_OUTPATIENT_CLINIC_OR_DEPARTMENT_OTHER): Payer: Self-pay | Admitting: Anesthesiology

## 2021-10-13 ENCOUNTER — Ambulatory Visit (INDEPENDENT_AMBULATORY_CARE_PROVIDER_SITE_OTHER): Payer: Medicare Other

## 2021-10-13 ENCOUNTER — Encounter (HOSPITAL_BASED_OUTPATIENT_CLINIC_OR_DEPARTMENT_OTHER): Payer: Self-pay | Admitting: Urology

## 2021-10-13 ENCOUNTER — Ambulatory Visit (HOSPITAL_COMMUNITY): Payer: Medicare Other

## 2021-10-13 ENCOUNTER — Other Ambulatory Visit: Payer: Self-pay

## 2021-10-13 DIAGNOSIS — I10 Essential (primary) hypertension: Secondary | ICD-10-CM | POA: Diagnosis not present

## 2021-10-13 DIAGNOSIS — N201 Calculus of ureter: Secondary | ICD-10-CM | POA: Diagnosis not present

## 2021-10-13 DIAGNOSIS — I4891 Unspecified atrial fibrillation: Secondary | ICD-10-CM | POA: Insufficient documentation

## 2021-10-13 DIAGNOSIS — Z7901 Long term (current) use of anticoagulants: Secondary | ICD-10-CM | POA: Diagnosis not present

## 2021-10-13 DIAGNOSIS — I495 Sick sinus syndrome: Secondary | ICD-10-CM | POA: Diagnosis not present

## 2021-10-13 DIAGNOSIS — Z95810 Presence of automatic (implantable) cardiac defibrillator: Secondary | ICD-10-CM | POA: Diagnosis not present

## 2021-10-13 DIAGNOSIS — N132 Hydronephrosis with renal and ureteral calculous obstruction: Secondary | ICD-10-CM | POA: Diagnosis not present

## 2021-10-13 HISTORY — PX: EXTRACORPOREAL SHOCK WAVE LITHOTRIPSY: SHX1557

## 2021-10-13 SURGERY — LITHOTRIPSY, ESWL
Anesthesia: Monitor Anesthesia Care | Laterality: Left

## 2021-10-13 MED ORDER — DIAZEPAM 5 MG PO TABS
ORAL_TABLET | ORAL | Status: AC
  Filled 2021-10-13: qty 2

## 2021-10-13 MED ORDER — SODIUM CHLORIDE 0.9 % IV SOLN: INTRAVENOUS | Status: DC

## 2021-10-13 MED ORDER — CIPROFLOXACIN HCL 500 MG PO TABS
ORAL_TABLET | ORAL | Status: AC
  Filled 2021-10-13: qty 1

## 2021-10-13 MED ORDER — DIPHENHYDRAMINE HCL 25 MG PO CAPS
ORAL_CAPSULE | ORAL | Status: AC
  Filled 2021-10-13: qty 1

## 2021-10-13 MED ORDER — DIAZEPAM 5 MG PO TABS
10.0000 mg | ORAL_TABLET | ORAL | Status: AC
  Administered 2021-10-13: 09:00:00 10 mg via ORAL

## 2021-10-13 MED ORDER — DIPHENHYDRAMINE HCL 25 MG PO CAPS
25.0000 mg | ORAL_CAPSULE | ORAL | Status: AC
  Administered 2021-10-13: 09:00:00 25 mg via ORAL

## 2021-10-13 MED ORDER — CIPROFLOXACIN HCL 500 MG PO TABS
500.0000 mg | ORAL_TABLET | ORAL | Status: AC
  Administered 2021-10-13: 09:00:00 500 mg via ORAL

## 2021-10-13 NOTE — H&P (Signed)
See scanned H&P

## 2021-10-13 NOTE — Op Note (Signed)
See [facility] OP note scanned into chart. Also because of the size, density, location and other factors that cannot be anticipated I feel this will likely be a staged procedure. This fact supersedes any indication in the scanned [facility] operative note to the contrary.  

## 2021-10-14 ENCOUNTER — Encounter (HOSPITAL_BASED_OUTPATIENT_CLINIC_OR_DEPARTMENT_OTHER): Payer: Self-pay | Admitting: Urology

## 2021-10-14 LAB — CUP PACEART REMOTE DEVICE CHECK
Battery Remaining Longevity: 168 mo
Battery Remaining Percentage: 100 %
Brady Statistic RA Percent Paced: 100 %
Brady Statistic RV Percent Paced: 0 %
Date Time Interrogation Session: 20221128025200
Implantable Lead Implant Date: 20100719
Implantable Lead Implant Date: 20211130
Implantable Lead Location: 753859
Implantable Lead Location: 753860
Implantable Lead Model: 4136
Implantable Lead Model: 7842
Implantable Lead Serial Number: 1055910
Implantable Lead Serial Number: 28594836
Implantable Pulse Generator Implant Date: 20211130
Lead Channel Impedance Value: 617 Ohm
Lead Channel Impedance Value: 724 Ohm
Lead Channel Setting Pacing Amplitude: 2 V
Lead Channel Setting Pacing Amplitude: 2.5 V
Lead Channel Setting Pacing Pulse Width: 1 ms
Lead Channel Setting Sensing Sensitivity: 2.5 mV
Pulse Gen Serial Number: 951809

## 2021-10-20 DIAGNOSIS — N202 Calculus of kidney with calculus of ureter: Secondary | ICD-10-CM | POA: Diagnosis not present

## 2021-10-20 NOTE — Progress Notes (Signed)
Remote pacemaker transmission.   

## 2021-10-28 ENCOUNTER — Other Ambulatory Visit: Payer: Self-pay | Admitting: Internal Medicine

## 2021-10-29 ENCOUNTER — Other Ambulatory Visit: Payer: Self-pay

## 2021-10-29 ENCOUNTER — Encounter: Payer: Self-pay | Admitting: Internal Medicine

## 2021-10-29 ENCOUNTER — Ambulatory Visit (INDEPENDENT_AMBULATORY_CARE_PROVIDER_SITE_OTHER): Payer: Medicare Other | Admitting: Internal Medicine

## 2021-10-29 VITALS — BP 112/70 | HR 75 | Ht 69.0 in | Wt 200.0 lb

## 2021-10-29 DIAGNOSIS — R42 Dizziness and giddiness: Secondary | ICD-10-CM | POA: Diagnosis not present

## 2021-10-29 DIAGNOSIS — G8929 Other chronic pain: Secondary | ICD-10-CM | POA: Diagnosis not present

## 2021-10-29 DIAGNOSIS — I10 Essential (primary) hypertension: Secondary | ICD-10-CM | POA: Diagnosis not present

## 2021-10-29 DIAGNOSIS — M79672 Pain in left foot: Secondary | ICD-10-CM | POA: Diagnosis not present

## 2021-10-29 MED ORDER — MECLIZINE HCL 12.5 MG PO TABS: ORAL_TABLET | ORAL | 5 refills | Status: DC

## 2021-10-29 MED ORDER — TRAMADOL HCL 50 MG PO TABS: 50.0000 mg | ORAL_TABLET | Freq: Four times a day (QID) | ORAL | 2 refills | Status: DC | PRN

## 2021-10-29 NOTE — Assessment & Plan Note (Signed)
Exam c/w djd flare now improved, for otc volt gel prn,  to f/u any worsening symptoms or concerns

## 2021-10-29 NOTE — Patient Instructions (Signed)
Ok to use the Voltaren gel as needed to the arthritis of the left and right feet as needed  Please continue all other medications as before, and refills have been done if requested - the tramadol and meclizine  Please have the pharmacy call with any other refills you may need.  Please continue your efforts at being more active, low cholesterol diet, and weight control.  Please keep your appointments with your specialists as you may have planned

## 2021-10-29 NOTE — Assessment & Plan Note (Signed)
BP Readings from Last 3 Encounters:  10/29/21 112/70  10/13/21 117/88  09/15/21 107/69   Stable, pt to continue medical treatment norvasc, toprol, micardis

## 2021-10-29 NOTE — Assessment & Plan Note (Signed)
Mild intermittent, for meclizine prn,  to f/u any worsening symptoms or concerns

## 2021-10-29 NOTE — Progress Notes (Signed)
Patient ID: [name], male   DOB: 11/05/1941, 80 y.o.   MRN: [id number]       Chief Complaint: follow up left foot swelling, chronic pain, and vertigo       HPI:  [name] is a 80 y.o. male here overall doing ok; c/o 3 days osnet mild pain and swelling to the left first MTP but mostly swelling and already near resolved; has similar pain and swelling off and on to the right first MTP bunion as well.   Has also chronic pain for tramadol refill.  Pt denies chest pain, increased sob or doe, wheezing, orthopnea, PND, increased LE swelling, palpitations, dizziness or syncope.   Pt denies polydipsia, polyuria, or new focal neuro s/s except for mild intermittent vertigo over the last 3 months.           Wt Readings from Last 3 Encounters:  10/29/21 200 lb (90.7 kg)  10/13/21 194 lb (88 kg)  09/04/21 167 lb 12.8 oz (76.1 kg)   BP Readings from Last 3 Encounters:  10/29/21 112/70  10/13/21 117/88  09/15/21 107/69         Past Medical History:  Diagnosis Date   Abdominal pain, epigastric    Allergic rhinitis, cause unspecified    Arthritis    BPH (benign prostatic hyperplasia)    CHF (congestive heart failure) (HCC)    Chronic anticoagulation    xarelto   Coronary artery calcification seen on CAT scan 12/26/2012   Depressive disorder, not elsewhere classified    Diverticulosis of colon (without mention of hemorrhage)    Esophageal reflux    High risk medication use    Hyperlipidemia    Hyperplastic rectal polyp    Hypertension    Lumbago    Pacemaker    Paralyzed hemidiaphragm    LEFT   Persistent atrial fibrillation ([place])    Sick sinus syndrome ([place]) 05/16/2009   s/p PPM ([employer]) by [name]   Past Surgical History:  Procedure Laterality Date   ESOPHAGOGASTRODUODENOSCOPY N/A 12/05/2013   Procedure: ESOPHAGOGASTRODUODENOSCOPY (EGD);  Surgeon: [name], MD;  Location: [facility];  Service: Endoscopy;  Laterality: N/A;   ESOPHAGOGASTRODUODENOSCOPY N/A 07/04/2015    Procedure: ESOPHAGOGASTRODUODENOSCOPY (EGD);  Surgeon: [name], MD;  Location: [facility];  Service: Endoscopy;  Laterality: N/A;   EXTRACORPOREAL SHOCK WAVE LITHOTRIPSY Left 10/13/2021   Procedure: EXTRACORPOREAL SHOCK WAVE LITHOTRIPSY (ESWL);  Surgeon: Lucas Mallow, MD;  Location: [facility];  Service: Urology;  Laterality: Left;  90 MINS   INSERT / REPLACE / REMOVE PACEMAKER  05/2009    [employer] [id number]  (serial number [id number]) dual-chamber pacemaker   LEAD REVISION/REPAIR N/A 10/15/2020   Procedure: LEAD REVISION/REPAIR;  Surgeon: Thompson Grayer, MD;  Location: [facility];  Service: Cardiovascular;  Laterality: N/A;   PACEMAKER PLACEMENT Bilateral    '05   PPM GENERATOR CHANGEOUT N/A 10/15/2020   Procedure: PPM GENERATOR CHANGEOUT;  Surgeon: Thompson Grayer, MD;  Location: [facility];  Service: Cardiovascular;  Laterality: N/A;    reports that he quit smoking about 29 years ago. His smoking use included cigarettes. He has a 10.00 pack-year smoking history. He has quit using smokeless tobacco.  His smokeless tobacco use included chew. He reports that he does not drink alcohol and does not use drugs. family history includes Arthritis in his paternal grandmother; Asthma in his sister; Cancer in his father; Diabetes in his mother; Hypertension in his  brother, daughter, father, mother, sister, and son; Lung cancer in his father. No Known Allergies Current Outpatient Medications on File Prior to Visit  Medication Sig Dispense Refill   acetaminophen (TYLENOL) 650 MG CR tablet Take 1,300 mg by mouth in the morning and at bedtime.     amLODipine (NORVASC) 5 MG tablet TAKE 1 TABLET BY MOUTH EVERY DAY 90 tablet 1   atorvastatin (LIPITOR) 80 MG tablet TAKE 1 TABLET BY MOUTH EVERY DAY 90 tablet 3   Cholecalciferol (VITAMIN D3) 50 MCG (2000 UT) TABS Take 2,000 Units by mouth daily.     clobetasol cream (TEMOVATE) 0.05 % APPLY TO AFFECTED AREA TWICE A  DAY 30 g 2   diclofenac Sodium (VOLTAREN) 1 % GEL Apply topically 4 (four) times daily.     dorzolamide-timolol (COSOPT) 22.3-6.8 MG/ML ophthalmic solution Place 2 drops into both eyes in the morning and at bedtime.     fexofenadine (ALLEGRA) 180 MG tablet TAKE 1 TABLET (180 MG TOTAL) BY MOUTH DAILY. 90 tablet 1   flecainide (TAMBOCOR) 100 MG tablet Take 1 tablet (100 mg total) by mouth 2 (two) times daily. 180 tablet 3   ketoconazole (NIZORAL) 2 % cream Apply 1 application topically daily. 15 g 0   latanoprost (XALATAN) 0.005 % ophthalmic solution Place 1 drop into both eyes at bedtime.      metoprolol succinate (TOPROL XL) 25 MG 24 hr tablet Take 1 tablet (25 mg total) by mouth daily. 90 tablet 2   omeprazole (PRILOSEC) 40 MG capsule Take 1 capsule by mouth daily 90 capsule 3   oxybutynin (DITROPAN-XL) 10 MG 24 hr tablet TAKE 1 TABLET (10 MG TOTAL) BY MOUTH DAILY. 90 tablet 1   psyllium (METAMUCIL) 0.52 g capsule Take 0.52 g by mouth in the morning and at bedtime.     rivaroxaban (XARELTO) 20 MG TABS tablet TAKE 1 TABLET BY MOUTH DAILY WITH SUPPER. 90 tablet 3   tamsulosin (FLOMAX) 0.4 MG CAPS capsule TAKE 1 CAPSULE BY MOUTH TWICE A DAY 180 capsule 2   telmisartan (MICARDIS) 20 MG tablet TAKE 1 TABLET BY MOUTH EVERY DAY 90 tablet 3   metoprolol tartrate (LOPRESSOR) 100 MG tablet Take 1 tablet (100 mg total) by mouth once for 1 dose. 2 hours prior to CT procedure 1 tablet 0   No current facility-administered medications on file prior to visit.        ROS:  All others reviewed and negative.  Objective        PE:  BP 112/70 (BP Location: Right Arm, Patient Position: Sitting, Cuff Size: Large)    Pulse 75    Ht 5\' 9"  (1.753 m)    Wt 200 lb (90.7 kg)    SpO2 98%    BMI 29.53 kg/m                 Constitutional: Pt appears in NAD               HENT: Head: NCAT.                Right Ear: External ear normal.                 Left Ear: External ear normal.                Eyes: . Pupils are  equal, round, and reactive to light. Conjunctivae and EOM are normal  Nose: without d/c or deformity               Neck: Neck supple. Gross normal ROM               Cardiovascular: Normal rate and regular rhythm.                 Pulmonary/Chest: Effort normal and breath sounds without rales or wheezing.                Abd:  Soft, NT, ND, + BS, no organomegaly               Neurological: Pt is alert. At baseline orientation, motor grossly intact               Skin: Skin is warm. No rashes, no other new lesions, LE edema - none; left foot with nontender left first MTP nondiscrete swelling wihtout overlying skin change               Psychiatric: Pt behavior is normal without agitation   Micro: none  Cardiac tracings I have personally interpreted today:  none  Pertinent Radiological findings (summarize): none   Lab Results  Component Value Date   WBC 6.2 09/04/2021   HGB 13.9 09/04/2021   HCT 40.4 09/04/2021   PLT 184 09/04/2021   GLUCOSE 103 (H) 09/04/2021   CHOL 185 11/07/2019   TRIG 59.0 11/07/2019   HDL 75.00 11/07/2019   LDLDIRECT 183.4 12/26/2013   LDLCALC 98 11/07/2019   ALT 41 01/13/2020   AST 29 01/13/2020   NA 140 09/04/2021   K 4.5 09/04/2021   CL 105 09/04/2021   CREATININE 0.82 09/04/2021   BUN 14 09/04/2021   CO2 27 09/04/2021   TSH 0.86 05/28/2020   PSA 1.76 11/07/2019   INR 2.2 (H) 12/09/2020   HGBA1C 6.2 11/07/2019   Assessment/Plan:  [name] is a 80 y.o. Black or African American [2] male with  has a past medical history of Abdominal pain, epigastric, Allergic rhinitis, cause unspecified, Arthritis, BPH (benign prostatic hyperplasia), CHF (congestive heart failure) ([place]), Chronic anticoagulation, Coronary artery calcification seen on CAT scan (12/26/2012), Depressive disorder, not elsewhere classified, Diverticulosis of colon (without mention of hemorrhage), Esophageal reflux, High risk medication use, Hyperlipidemia, Hyperplastic rectal polyp,  Hypertension, Lumbago, Pacemaker, Paralyzed hemidiaphragm, Persistent atrial fibrillation ([place]), and Sick sinus syndrome ([place]) (05/16/2009).  Left foot pain Exam c/w djd flare now improved, for otc volt gel prn,  to f/u any worsening symptoms or concerns  Chronic pain Stable overall, for tramadol refill,  to f/u any worsening symptoms or concerns  Dizziness Mild intermittent, for meclizine prn,  to f/u any worsening symptoms or concerns  Essential hypertension BP Readings from Last 3 Encounters:  10/29/21 112/70  10/13/21 117/88  09/15/21 107/69   Stable, pt to continue medical treatment norvasc, toprol, micardis  Followup: Return in about 26 days (around 11/24/2021).  Cathlean Cower, MD 10/29/2021 9:43 PM [facility]

## 2021-10-29 NOTE — Assessment & Plan Note (Signed)
Stable overall, for tramadol refill,  to f/u any worsening symptoms or concerns

## 2021-11-04 ENCOUNTER — Emergency Department (HOSPITAL_BASED_OUTPATIENT_CLINIC_OR_DEPARTMENT_OTHER): Payer: Medicare Other | Admitting: Radiology

## 2021-11-04 ENCOUNTER — Emergency Department (HOSPITAL_BASED_OUTPATIENT_CLINIC_OR_DEPARTMENT_OTHER)
Admission: EM | Admit: 2021-11-04 | Discharge: 2021-11-04 | Disposition: A | Discharge status: Home / Self Care | Payer: Medicare Other | Attending: Emergency Medicine | Admitting: Emergency Medicine

## 2021-11-04 ENCOUNTER — Other Ambulatory Visit: Payer: Self-pay

## 2021-11-04 ENCOUNTER — Encounter (HOSPITAL_BASED_OUTPATIENT_CLINIC_OR_DEPARTMENT_OTHER): Payer: Self-pay

## 2021-11-04 ENCOUNTER — Emergency Department (HOSPITAL_BASED_OUTPATIENT_CLINIC_OR_DEPARTMENT_OTHER): Payer: Medicare Other

## 2021-11-04 DIAGNOSIS — M25561 Pain in right knee: Secondary | ICD-10-CM | POA: Diagnosis not present

## 2021-11-04 DIAGNOSIS — I4891 Unspecified atrial fibrillation: Secondary | ICD-10-CM | POA: Insufficient documentation

## 2021-11-04 DIAGNOSIS — Z87891 Personal history of nicotine dependence: Secondary | ICD-10-CM | POA: Insufficient documentation

## 2021-11-04 DIAGNOSIS — J449 Chronic obstructive pulmonary disease, unspecified: Secondary | ICD-10-CM | POA: Insufficient documentation

## 2021-11-04 DIAGNOSIS — I509 Heart failure, unspecified: Secondary | ICD-10-CM | POA: Insufficient documentation

## 2021-11-04 DIAGNOSIS — Y92410 Unspecified street and highway as the place of occurrence of the external cause: Secondary | ICD-10-CM | POA: Insufficient documentation

## 2021-11-04 DIAGNOSIS — S0990XA Unspecified injury of head, initial encounter: Secondary | ICD-10-CM | POA: Diagnosis not present

## 2021-11-04 DIAGNOSIS — G319 Degenerative disease of nervous system, unspecified: Secondary | ICD-10-CM | POA: Diagnosis not present

## 2021-11-04 DIAGNOSIS — Z79899 Other long term (current) drug therapy: Secondary | ICD-10-CM | POA: Diagnosis not present

## 2021-11-04 DIAGNOSIS — Z041 Encounter for examination and observation following transport accident: Secondary | ICD-10-CM | POA: Diagnosis not present

## 2021-11-04 DIAGNOSIS — M545 Low back pain, unspecified: Secondary | ICD-10-CM | POA: Diagnosis not present

## 2021-11-04 DIAGNOSIS — Z950 Presence of cardiac pacemaker: Secondary | ICD-10-CM | POA: Insufficient documentation

## 2021-11-04 DIAGNOSIS — I110 Hypertensive heart disease with heart failure: Secondary | ICD-10-CM | POA: Insufficient documentation

## 2021-11-04 DIAGNOSIS — Z7901 Long term (current) use of anticoagulants: Secondary | ICD-10-CM | POA: Insufficient documentation

## 2021-11-04 MED ORDER — ACETAMINOPHEN 500 MG PO TABS
1000.0000 mg | ORAL_TABLET | Freq: Once | ORAL | Status: AC
  Administered 2021-11-04: 15:00:00 1000 mg via ORAL
  Filled 2021-11-04: qty 2
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
## 2021-11-13 ENCOUNTER — Telehealth: Payer: Self-pay | Admitting: Internal Medicine

## 2021-11-13 NOTE — Telephone Encounter (Signed)
Shirley Friar, PA-C  Agree with sodium restriction, would also advise to limit fluid intake to LESS than 2000 mL (2 liters) daily, Ideally closer to 1.5 L. Not on diuretics.  Will assess further at visit next week.   Gave patient advisement.  Verbalized understanding and agreement.

## 2021-11-13 NOTE — Telephone Encounter (Signed)
The patient is calling in complaining of swelling of BLE, mostly in his feet. He stated he went to his primary care provider a few weeks ago and he thought the swelling was related to his arthritis and made no changes according to the patient. The swelling has not went down and he has not contacted his PCP back. Current BP, and HR 114/78, 64 and trending around the same daily. He will continue to elevate legs during the day but this is not helping much. He has no compression stockings. No pain but his feet feel tight. At PCP visit he was 200 lbs. Scheduling has made an appointment on 11/19/21 with Oda Kilts PA for edema. Advised patient to limit his salt intake. Will forward to MD and PA for advisement.   Patient verbalized understanding and agreement.

## 2021-11-13 NOTE — Telephone Encounter (Signed)
Pt c/o swelling: STAT is pt has developed SOB within 24 hours  How much weight have you gained and in what time span?  No significant weight gain   If swelling, where is the swelling located?  Feet, ankles, legs   Are you currently taking a fluid pill?  No   Are you currently SOB?  No   Do you have a log of your daily weights (if so, list)?  No   Have you gained 3 pounds in a day or 5 pounds in a week?  No   Have you traveled recently?  No and patient states he tries to elevate his legs throughout the day

## 2021-11-19 ENCOUNTER — Encounter: Payer: Self-pay | Admitting: Student

## 2021-11-19 ENCOUNTER — Ambulatory Visit (INDEPENDENT_AMBULATORY_CARE_PROVIDER_SITE_OTHER): Payer: Medicare Other | Admitting: Student

## 2021-11-19 ENCOUNTER — Other Ambulatory Visit: Payer: Self-pay

## 2021-11-19 VITALS — BP 112/70 | HR 60 | Ht 69.0 in | Wt 203.0 lb

## 2021-11-19 DIAGNOSIS — I480 Paroxysmal atrial fibrillation: Secondary | ICD-10-CM

## 2021-11-19 DIAGNOSIS — I495 Sick sinus syndrome: Secondary | ICD-10-CM

## 2021-11-19 DIAGNOSIS — I10 Essential (primary) hypertension: Secondary | ICD-10-CM

## 2021-11-19 DIAGNOSIS — I5032 Chronic diastolic (congestive) heart failure: Secondary | ICD-10-CM

## 2021-11-19 LAB — BASIC METABOLIC PANEL
BUN/Creatinine Ratio: 17 (ref 10–24)
BUN: 18 mg/dL (ref 8–27)
CO2: 23 mmol/L (ref 20–29)
Calcium: 9.5 mg/dL (ref 8.6–10.2)
Chloride: 104 mmol/L (ref 96–106)
Creatinine, Ser: 1.03 mg/dL (ref 0.76–1.27)
Glucose: 106 mg/dL — ABNORMAL HIGH (ref 70–99)
Potassium: 4.2 mmol/L (ref 3.5–5.2)
Sodium: 140 mmol/L (ref 134–144)
eGFR: 74 mL/min/{1.73_m2} (ref >59)

## 2021-11-19 LAB — CUP PACEART INCLINIC DEVICE CHECK
Date Time Interrogation Session: 20230104100845
Implantable Lead Implant Date: 20100719
Implantable Lead Implant Date: 20211130
Implantable Lead Location: 753859
Implantable Lead Location: 753860
Implantable Lead Model: 4136
Implantable Lead Model: 7842
Implantable Lead Serial Number: 1055910
Implantable Lead Serial Number: 28594836
Implantable Pulse Generator Implant Date: 20211130
Lead Channel Impedance Value: 621 Ohm
Lead Channel Impedance Value: 714 Ohm
Lead Channel Pacing Threshold Amplitude: 0.9 V
Lead Channel Pacing Threshold Amplitude: 1 V
Lead Channel Pacing Threshold Pulse Width: 0.4 ms
Lead Channel Pacing Threshold Pulse Width: 1 ms
Lead Channel Setting Pacing Amplitude: 2 V
Lead Channel Setting Pacing Amplitude: 2.5 V
Lead Channel Setting Pacing Pulse Width: 1 ms
Lead Channel Setting Sensing Sensitivity: 2.5 mV
Pulse Gen Serial Number: 951809

## 2021-11-19 MED ORDER — FUROSEMIDE 20 MG PO TABS: 20.0000 mg | ORAL_TABLET | ORAL | 3 refills | Status: DC

## 2021-11-19 NOTE — Patient Instructions (Signed)
Medication Instructions:  Your physician has recommended you make the following change in your medication:   START: Furosemide (Lasix) 20mg . Take 1 tablet if weight gain is >3lbs overnight or >5lbs in a week. No more than 2 tablets weekly.   *If you need a refill on your cardiac medications before your next appointment, please call your pharmacy*   Lab Work: TODAY: BMET  If you have labs (blood work) drawn today and your tests are completely normal, you will receive your results only by: [place] (if you have MyChart) OR A paper copy in the mail If you have any lab test that is abnormal or we need to change your treatment, we will call you to review the results.   Follow-Up: At [facility], you and your health needs are our priority.  As part of our continuing mission to provide you with exceptional heart care, we have created designated Provider Care Teams.  These Care Teams include your primary Cardiologist (physician) and Advanced Practice Providers (APPs -  Physician Assistants and Nurse Practitioners) who all work together to provide you with the care you need, when you need it.   Your next appointment:   6 month(s)  The format for your next appointment:   In Person  Provider:   You may see Thompson Grayer, MD or one of the following Advanced Practice Providers on your designated Care Team:   Tommye Standard, [place] Legrand Como "[facility]" [place], [place]

## 2021-11-19 NOTE — Progress Notes (Signed)
Electrophysiology Office Note Date: 11/19/2021  ID:  DEMARQUEZ CIOLEK, DOB Mar 30, 1941, MRN [id number]  PCP: Biagio Borg, MD Primary Cardiologist: None Electrophysiologist: Thompson Grayer, MD   CC: Pacemaker follow-up  [name] is a 81 y.o. male seen today for Thompson Grayer, MD for acute visit due to pedal edema .  Since last being seen in our clinic the patient reports struggling with kidney stones over the past month, and some weight again edema noted around holiday meals. He has been drinking extra water in attempt to flush out the kidney stones. No change in his chronic SOB with mod or more exertion. He denies chest pain, palpitations, PND, orthopnea, nausea, vomiting, dizziness, syncope, weight gain, or early satiety.  Device History: BSCi dual chamber PPM implant 06/03/2009 gen change 10/15/2020 with new RV lead implant HE HAS AN ABANDONED RV LEAD  Past Medical History:  Diagnosis Date   Abdominal pain, epigastric    Allergic rhinitis, cause unspecified    Arthritis    BPH (benign prostatic hyperplasia)    CHF (congestive heart failure) (HCC)    Chronic anticoagulation    xarelto   Coronary artery calcification seen on CAT scan 12/26/2012   Depressive disorder, not elsewhere classified    Diverticulosis of colon (without mention of hemorrhage)    Esophageal reflux    High risk medication use    Hyperlipidemia    Hyperplastic rectal polyp    Hypertension    Lumbago    Pacemaker    Paralyzed hemidiaphragm    LEFT   Persistent atrial fibrillation ([place])    Sick sinus syndrome ([place]) 05/16/2009   s/p PPM ([employer]) by [name]   Past Surgical History:  Procedure Laterality Date   ESOPHAGOGASTRODUODENOSCOPY N/A 12/05/2013   Procedure: ESOPHAGOGASTRODUODENOSCOPY (EGD);  Surgeon: [name], MD;  Location: [facility];  Service: Endoscopy;  Laterality: N/A;   ESOPHAGOGASTRODUODENOSCOPY N/A 07/04/2015   Procedure: ESOPHAGOGASTRODUODENOSCOPY (EGD);  Surgeon: [name], MD;  Location: [facility];  Service: Endoscopy;  Laterality: N/A;   EXTRACORPOREAL SHOCK WAVE LITHOTRIPSY Left 10/13/2021   Procedure: EXTRACORPOREAL SHOCK WAVE LITHOTRIPSY (ESWL);  Surgeon: Lucas Mallow, MD;  Location[facility];  Service: Urology;  Laterality: Left;  90 MINS   INSERT / REPLACE / REMOVE PACEMAKER  05/2009    [employer] scientific [id number]  (serial number [id number]) dual-chamber pacemaker   LEAD REVISION/REPAIR N/A 10/15/2020   Procedure: LEAD REVISION/REPAIR;  Surgeon: Thompson Grayer, MD;  Location: [facility];  Service: Cardiovascular;  Laterality: N/A;   PACEMAKER PLACEMENT Bilateral    '05   PPM GENERATOR CHANGEOUT N/A 10/15/2020   Procedure: PPM GENERATOR CHANGEOUT;  Surgeon: Thompson Grayer, MD;  Location: [facility];  Service: Cardiovascular;  Laterality: N/A;    Current Outpatient Medications  Medication Sig Dispense Refill   acetaminophen (TYLENOL) 650 MG CR tablet Take 1,300 mg by mouth in the morning and at bedtime.     amLODipine (NORVASC) 5 MG tablet TAKE 1 TABLET BY MOUTH EVERY DAY 90 tablet 1   atorvastatin (LIPITOR) 80 MG tablet TAKE 1 TABLET BY MOUTH EVERY DAY 90 tablet 3   Cholecalciferol (VITAMIN D3) 50 MCG (2000 UT) TABS Take 2,000 Units by mouth daily.     clobetasol cream (TEMOVATE) 0.05 % APPLY TO AFFECTED AREA TWICE A DAY 30 g 2   diclofenac Sodium (VOLTAREN) 1 % GEL Apply topically 4 (four) times daily.     dorzolamide-timolol (COSOPT)  22.3-6.8 MG/ML ophthalmic solution Place 2 drops into both eyes in the morning and at bedtime.     fexofenadine (ALLEGRA) 180 MG tablet TAKE 1 TABLET (180 MG TOTAL) BY MOUTH DAILY. 90 tablet 1   flecainide (TAMBOCOR) 100 MG tablet Take 1 tablet (100 mg total) by mouth 2 (two) times daily. 180 tablet 3   ketoconazole (NIZORAL) 2 % cream Apply 1 application topically daily. 15 g 0   ketoconazole (NIZORAL) 2 % shampoo as needed for irritation.     latanoprost (XALATAN) 0.005 %  ophthalmic solution Place 1 drop into both eyes at bedtime.      meclizine (ANTIVERT) 12.5 MG tablet 1 tab by mouth every 8 hrs as needed 30 tablet 5   metoprolol succinate (TOPROL XL) 25 MG 24 hr tablet Take 1 tablet (25 mg total) by mouth daily. 90 tablet 2   omeprazole (PRILOSEC) 40 MG capsule Take 1 capsule by mouth daily 90 capsule 3   oxybutynin (DITROPAN-XL) 10 MG 24 hr tablet TAKE 1 TABLET (10 MG TOTAL) BY MOUTH DAILY. 90 tablet 1   psyllium (METAMUCIL) 0.52 g capsule Take 0.52 g by mouth in the morning and at bedtime.     rivaroxaban (XARELTO) 20 MG TABS tablet TAKE 1 TABLET BY MOUTH DAILY WITH SUPPER. 90 tablet 3   tamsulosin (FLOMAX) 0.4 MG CAPS capsule TAKE 1 CAPSULE BY MOUTH TWICE A DAY 180 capsule 2   telmisartan (MICARDIS) 20 MG tablet TAKE 1 TABLET BY MOUTH EVERY DAY 90 tablet 3   traMADol (ULTRAM) 50 MG tablet Take 1 tablet (50 mg total) by mouth every 6 (six) hours as needed. for pain 120 tablet 2   No current facility-administered medications for this visit.    Allergies:   Patient has no known allergies.   Social History: Social History   Socioeconomic History   Marital status: Married    Spouse name: Not on file   Number of children: 4   Years of education: Not on file   Highest education level: Not on file  Occupational History   Occupation: retired    Fish farm manager: RETIRED  Tobacco Use   Smoking status: Former    Packs/day: 0.50    Years: 20.00    Pack years: 10.00    Types: Cigarettes    Quit date: 11/17/1991    Years since quitting: 30.0   Smokeless tobacco: Former    Types: Nurse, children's Use: Never used  Substance and Sexual Activity   Alcohol use: No   Drug use: No   Sexual activity: Never  Other Topics Concern   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    Social Determinants of Health   Financial Resource Strain: Low Risk    Difficulty of Paying Living Expenses: Not hard at all  Food Insecurity: No  Food Insecurity   Worried About Charity fundraiser in the Last Year: Never true   [place] in the Last Year: Never true  Transportation Needs: No Transportation Needs   Lack of Transportation (Medical): No   Lack of Transportation (Non-Medical): No  Physical Activity: Sufficiently Active   Days of Exercise per Week: 5 days   Minutes of Exercise per Session: 30 min  Stress: No Stress Concern Present   Feeling of Stress : Not at all  Social Connections: Socially Isolated   Frequency of Communication with Friends and Family: More than  three times a week   Frequency of Social Gatherings with Friends and Family: Once a week   Attends Religious Services: Never   Marine scientist or Organizations: No   Attends Music therapist: Never   Marital Status: Never married  Human resources officer Violence: Not At Risk   Fear of Current or Ex-Partner: No   Emotionally Abused: No   Physically Abused: No   Sexually Abused: No    Family History: Family History  Problem Relation Age of Onset   Diabetes Mother    Hypertension Mother    Hypertension Father    Lung cancer Father    Cancer Father    Asthma Sister    Hypertension Sister    Arthritis Paternal Grandmother    Hypertension Brother    Hypertension Daughter    Hypertension Son      Review of Systems: All other systems reviewed and are otherwise negative except as noted above.  Physical Exam: Vitals:   11/19/21 0943  BP: 112/70  Pulse: 60  SpO2: 99%  Weight: 203 lb (92.1 kg)  Height: 5\' 9"  (1.753 m)     GEN- The patient is well appearing, alert and oriented x 3 today.   HEENT: normocephalic, atraumatic; sclera clear, conjunctiva pink; hearing intact; oropharynx clear; neck supple  Lungs- Clear to ausculation bilaterally, normal work of breathing.  No wheezes, rales, rhonchi Heart- Regular rate and rhythm, no murmurs, rubs or gallops  GI- soft, non-tender, non-distended, bowel sounds present   Extremities- no clubbing or cyanosis. No edema MS- no significant deformity or atrophy Skin- warm and dry, no rash or lesion; PPM pocket well healed Psych- euthymic mood, full affect Neuro- strength and sensation are intact  PPM Interrogation- reviewed in detail today,  See PACEART report  EKG:  EKG is not ordered today. Personal review of ekg ordered  09/04/21  shows NSR at 65 bpm, APaced   Recent Labs: 09/04/2021: BUN 14; Creatinine, Ser 0.82; Hemoglobin 13.9; Platelets 184; Potassium 4.5; Sodium 140   Wt Readings from Last 3 Encounters:  11/19/21 203 lb (92.1 kg)  11/04/21 200 lb (90.7 kg)  10/29/21 200 lb (90.7 kg)     Other studies Reviewed: Additional studies/ records that were reviewed today include: Previous EP office notes, Previous remote checks, Most recent labwork.   Assessment and Plan:  1. SND s/p [employer] PPM  Normal PPM function See [name] report No changes today  2. Chronic diastolic CHF Echo with normal EF 04/2020 Volume status mildly elevated in setting of increased sodium (holidays) and fluid intake Take 20 mg lasix as needed, 1-2 times weekly, for weight gain of 3 lbs overnight or 5 lbs within one week.   3. Paroxysmal atrial fibrillation Stable EKG 08/2021.  Burden <1%. BMET today Continue flecainide and Xarelto  4. HTN Stable on current regimen  Consider alternative to amlodipine if swelling does not improve  Current medicines are reviewed at length with the patient today.    Labs/ tests ordered today include:  BMET  Disposition:   Follow up with EP APP in 6 months    Signed, Shirley Friar, PA-C  11/19/2021 10:12 AM  [facility] [address] [place]  [address] [phone] (office) [phone] (fax)

## 2021-11-21 DIAGNOSIS — N201 Calculus of ureter: Secondary | ICD-10-CM | POA: Diagnosis not present

## 2021-11-24 ENCOUNTER — Encounter: Payer: Self-pay | Admitting: Internal Medicine

## 2021-11-24 ENCOUNTER — Other Ambulatory Visit: Payer: Self-pay

## 2021-11-24 ENCOUNTER — Ambulatory Visit (INDEPENDENT_AMBULATORY_CARE_PROVIDER_SITE_OTHER): Payer: Medicare Other | Admitting: Internal Medicine

## 2021-11-24 VITALS — BP 118/72 | HR 64 | Temp 98.2°F | Ht 69.0 in | Wt 201.0 lb

## 2021-11-24 DIAGNOSIS — N400 Enlarged prostate without lower urinary tract symptoms: Secondary | ICD-10-CM

## 2021-11-24 DIAGNOSIS — R739 Hyperglycemia, unspecified: Secondary | ICD-10-CM

## 2021-11-24 DIAGNOSIS — Z0001 Encounter for general adult medical examination with abnormal findings: Secondary | ICD-10-CM

## 2021-11-24 DIAGNOSIS — M79605 Pain in left leg: Secondary | ICD-10-CM

## 2021-11-24 DIAGNOSIS — I10 Essential (primary) hypertension: Secondary | ICD-10-CM | POA: Diagnosis not present

## 2021-11-24 DIAGNOSIS — E538 Deficiency of other specified B group vitamins: Secondary | ICD-10-CM | POA: Diagnosis not present

## 2021-11-24 DIAGNOSIS — M79604 Pain in right leg: Secondary | ICD-10-CM

## 2021-11-24 DIAGNOSIS — E559 Vitamin D deficiency, unspecified: Secondary | ICD-10-CM

## 2021-11-24 LAB — URINALYSIS, ROUTINE W REFLEX MICROSCOPIC
Bilirubin Urine: NEGATIVE
Ketones, ur: NEGATIVE
Leukocytes,Ua: NEGATIVE
Nitrite: NEGATIVE
Specific Gravity, Urine: 1.02 (ref 1.000–1.030)
Total Protein, Urine: NEGATIVE
Urine Glucose: NEGATIVE
Urobilinogen, UA: 1 (ref 0.0–1.0)
pH: 6 (ref 5.0–8.0)

## 2021-11-24 LAB — CBC WITH DIFFERENTIAL/PLATELET
Basophils Absolute: 0 10*3/uL (ref 0.0–0.1)
Basophils Relative: 0.3 % (ref 0.0–3.0)
Eosinophils Absolute: 0.1 10*3/uL (ref 0.0–0.7)
Eosinophils Relative: 1.2 % (ref 0.0–5.0)
HCT: 43.5 % (ref 39.0–52.0)
Hemoglobin: 14.2 g/dL (ref 13.0–17.0)
Lymphocytes Relative: 23.8 % (ref 12.0–46.0)
Lymphs Abs: 1.6 10*3/uL (ref 0.7–4.0)
MCHC: 32.6 g/dL (ref 30.0–36.0)
MCV: 92.9 fl (ref 78.0–100.0)
Monocytes Absolute: 0.9 10*3/uL (ref 0.1–1.0)
Monocytes Relative: 14.1 % — ABNORMAL HIGH (ref 3.0–12.0)
Neutro Abs: 4 10*3/uL (ref 1.4–7.7)
Neutrophils Relative %: 60.6 % (ref 43.0–77.0)
Platelets: 174 10*3/uL (ref 150.0–400.0)
RBC: 4.68 Mil/uL (ref 4.22–5.81)
RDW: 14.2 % (ref 11.5–15.5)
WBC: 6.6 10*3/uL (ref 4.0–10.5)

## 2021-11-24 LAB — BASIC METABOLIC PANEL
BUN: 18 mg/dL (ref 6–23)
CO2: 28 mEq/L (ref 19–32)
Calcium: 9.5 mg/dL (ref 8.4–10.5)
Chloride: 105 mEq/L (ref 96–112)
Creatinine, Ser: 1.12 mg/dL (ref 0.40–1.50)
GFR: 62.88 mL/min (ref >60.00)
Glucose, Bld: 99 mg/dL (ref 70–99)
Potassium: 4.6 mEq/L (ref 3.5–5.1)
Sodium: 141 mEq/L (ref 135–145)

## 2021-11-24 LAB — PSA: PSA: 2.01 ng/mL (ref 0.10–4.00)

## 2021-11-24 LAB — LIPID PANEL
Cholesterol: 155 mg/dL (ref 0–200)
HDL: 61.1 mg/dL (ref >39.00)
LDL Cholesterol: 81 mg/dL (ref 0–99)
NonHDL: 93.86
Total CHOL/HDL Ratio: 3
Triglycerides: 62 mg/dL (ref 0.0–149.0)
VLDL: 12.4 mg/dL (ref 0.0–40.0)

## 2021-11-24 LAB — VITAMIN D 25 HYDROXY (VIT D DEFICIENCY, FRACTURES): VITD: 34.45 ng/mL (ref 30.00–100.00)

## 2021-11-24 LAB — HEPATIC FUNCTION PANEL
ALT: 24 U/L (ref 0–53)
AST: 19 U/L (ref 0–37)
Albumin: 4.4 g/dL (ref 3.5–5.2)
Alkaline Phosphatase: 83 U/L (ref 39–117)
Bilirubin, Direct: 0.1 mg/dL (ref 0.0–0.3)
Total Bilirubin: 0.6 mg/dL (ref 0.2–1.2)
Total Protein: 7 g/dL (ref 6.0–8.3)

## 2021-11-24 LAB — HEMOGLOBIN A1C: Hgb A1c MFr Bld: 6.3 % (ref 4.6–6.5)

## 2021-11-24 LAB — TSH: TSH: 1.43 u[IU]/mL (ref 0.35–5.50)

## 2021-11-24 LAB — VITAMIN B12: Vitamin B-12: 389 pg/mL (ref 211–911)
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
## 2021-11-24 NOTE — Assessment & Plan Note (Signed)
Last vitamin D Lab Results  Component Value Date   VD25OH 22.25 (L) 11/07/2019   Low, to start oral replacement

## 2021-11-24 NOTE — Patient Instructions (Signed)
Please continue all other medications as before, and refills have been done if requested.  Please have the pharmacy call with any other refills you may need.  Please continue your efforts at being more active, low cholesterol diet, and weight control.  You are otherwise up to date with prevention measures today.  Please keep your appointments with your specialists as you may have planned  You will be contacted regarding the referral for: leg circulation testing to check the arteries  Please go to the LAB at the blood drawing area for the tests to be done  You will be contacted by phone if any changes need to be made immediately.  Otherwise, you will receive a letter about your results with an explanation, but please check with MyChart first.  Please remember to sign up for MyChart if you have not done so, as this will be important to you in the future with finding out test results, communicating by private email, and scheduling acute appointments online when needed.  Please make an Appointment to return in 6 months, or sooner if needed

## 2021-11-29 ENCOUNTER — Encounter: Payer: Self-pay | Admitting: Internal Medicine

## 2021-11-29 NOTE — Assessment & Plan Note (Signed)
Recent worsening, cant r/o PAD  - for arterial vascular study

## 2021-11-29 NOTE — Assessment & Plan Note (Signed)
Lab Results  Component Value Date   HGBA1C 6.3 11/24/2021   Stable, pt to continue current medical treatment  - diet

## 2021-11-29 NOTE — Assessment & Plan Note (Signed)
BP Readings from Last 3 Encounters:  11/24/21 118/72  11/19/21 112/70  11/04/21 (!) 153/90   Stable, pt to continue medical treatment norvasc

## 2021-11-29 NOTE — Assessment & Plan Note (Signed)

## 2021-12-01 ENCOUNTER — Telehealth: Payer: Self-pay | Admitting: Internal Medicine

## 2021-12-01 ENCOUNTER — Encounter: Payer: Self-pay | Admitting: Internal Medicine

## 2021-12-01 ENCOUNTER — Other Ambulatory Visit: Payer: Self-pay | Admitting: Internal Medicine

## 2021-12-01 ENCOUNTER — Other Ambulatory Visit: Payer: Self-pay

## 2021-12-01 ENCOUNTER — Ambulatory Visit (HOSPITAL_COMMUNITY)
Admission: RE | Admit: 2021-12-01 | Discharge: 2021-12-01 | Disposition: A | Discharge status: Home / Self Care | Payer: Medicare Other | Source: Ambulatory Visit | Attending: Cardiology | Admitting: Cardiology

## 2021-12-01 DIAGNOSIS — Z87891 Personal history of nicotine dependence: Secondary | ICD-10-CM | POA: Diagnosis not present

## 2021-12-01 DIAGNOSIS — M79605 Pain in left leg: Secondary | ICD-10-CM | POA: Insufficient documentation

## 2021-12-01 DIAGNOSIS — M79604 Pain in right leg: Secondary | ICD-10-CM | POA: Insufficient documentation

## 2021-12-01 NOTE — Telephone Encounter (Signed)
Pt c/o swelling: STAT is pt has developed SOB within 24 hours  If swelling, where is the swelling located? Legs and feet   How much weight have you gained and in what time span? No weight gain   Have you gained 3 pounds in a day or 5 pounds in a week? no  Do you have a log of your daily weights (if so, list)? Yes 201 consistently  Are you currently taking a fluid pill? no  Are you currently SOB? Yes   Have you traveled recently? no

## 2021-12-01 NOTE — Telephone Encounter (Signed)
Spoke with the patient who reports that he continues to have swelling in his legs and feet. He has not gained any weight. He states that he does have shortness of breath but it is not more than his baseline. He reports that he is limiting the salt in his diet. He states that he tries to elevate his legs. Patient has not taken any Lasix because he has not gained any weight. Patient encouraged to continue to elevate his legs frequently and avoid salt. Advised that he could take a Lasix tablet to see if swelling goes down.

## 2021-12-05 DIAGNOSIS — N201 Calculus of ureter: Secondary | ICD-10-CM | POA: Diagnosis not present

## 2021-12-09 DIAGNOSIS — N202 Calculus of kidney with calculus of ureter: Secondary | ICD-10-CM | POA: Diagnosis not present

## 2021-12-09 DIAGNOSIS — K5730 Diverticulosis of large intestine without perforation or abscess without bleeding: Secondary | ICD-10-CM | POA: Diagnosis not present

## 2021-12-09 DIAGNOSIS — N201 Calculus of ureter: Secondary | ICD-10-CM | POA: Diagnosis not present

## 2021-12-09 DIAGNOSIS — N1330 Unspecified hydronephrosis: Secondary | ICD-10-CM | POA: Diagnosis not present

## 2021-12-09 DIAGNOSIS — K4090 Unilateral inguinal hernia, without obstruction or gangrene, not specified as recurrent: Secondary | ICD-10-CM | POA: Diagnosis not present

## 2021-12-12 ENCOUNTER — Telehealth: Payer: Self-pay | Admitting: Internal Medicine

## 2021-12-12 NOTE — Telephone Encounter (Signed)
° °  Pre-operative Risk Assessment    Patient Name: [name]  DOB: 1941/05/22 MRN: [id number]      Request for Surgical Clearance    Procedure:   Ureteroscopy and laser Lipotripsy  Date of Surgery:  Clearance TBD                                 Surgeon:  Dr.G.  Milford Cage Surgeon's Group or Practice Name:   [facility] Phone number:  [phone] Fax number:  [phone]   Type of Clearance Requested:   - Medical  - Pharmacy:  Hold Rivaroxaban (Xarelto) 72 hrs prior too    Type of Anesthesia:   Choice    Additional requests/questions:      Eston Mould   12/12/2021, 9:23 AM

## 2021-12-12 NOTE — Telephone Encounter (Signed)
Patient with diagnosis of afib on Xarelto for anticoagulation.    Procedure: ureteroscopy and laser lithotripsy Date of procedure: TBD  CHA2DS2-VASc Score = 5  This indicates a 7.2% annual risk of stroke. The patient's score is based upon: CHF History: 1 HTN History: 1 Diabetes History: 0 Stroke History: 0 Vascular Disease History: 1 Age Score: 2 Gender Score: 0   CrCl 15mL/min Platelet count 174K  Cleared for very similar surgery in 09/22/21 note, not sure if this is the same procedure or different.  Same clearance rec as previous still stands. 2 day Xarelto hold should be sufficient for this procedure. If 3 days is required by MD performing procedure, pt is at acceptable risk to hold for 3 days.

## 2021-12-12 NOTE — Telephone Encounter (Signed)
° ° °  Patient Name: [name]  DOB: 11/29/1940 MRN: [id number]  Primary Cardiologist: Waldon Merl  Chart reviewed as part of pre-operative protocol coverage. The patient was doing well when seen by Shirley Friar, [facility] 11/19/21.. Given past medical history and time since last visit, based on ACC/AHA guidelines, [name] would be at acceptable risk for the planned procedure without further cardiovascular testing.    Pharmacy to review anticoagulation.   [place], [place] 12/12/2021, 9:40 AM

## 2021-12-14 ENCOUNTER — Other Ambulatory Visit: Payer: Self-pay | Admitting: Internal Medicine

## 2021-12-14 NOTE — Telephone Encounter (Signed)
Please refill as per office routine med refill policy (all routine meds to be refilled for 3 mo or monthly (per pt preference) up to one year from last visit, then month to month grace period for 3 mo, then further med refills will have to be denied) ? ?

## 2021-12-15 ENCOUNTER — Other Ambulatory Visit: Payer: Self-pay | Admitting: Urology

## 2021-12-15 ENCOUNTER — Other Ambulatory Visit: Payer: Self-pay | Admitting: Internal Medicine

## 2021-12-15 ENCOUNTER — Other Ambulatory Visit: Payer: Self-pay | Admitting: Student

## 2021-12-15 DIAGNOSIS — I5032 Chronic diastolic (congestive) heart failure: Secondary | ICD-10-CM

## 2021-12-15 DIAGNOSIS — K219 Gastro-esophageal reflux disease without esophagitis: Secondary | ICD-10-CM

## 2021-12-15 NOTE — Telephone Encounter (Signed)
Please refill as per office routine med refill policy (all routine meds to be refilled for 3 mo or monthly (per pt preference) up to one year from last visit, then month to month grace period for 3 mo, then further med refills will have to be denied) ? ?

## 2021-12-23 ENCOUNTER — Encounter (HOSPITAL_BASED_OUTPATIENT_CLINIC_OR_DEPARTMENT_OTHER): Payer: Self-pay | Admitting: Urology

## 2021-12-23 ENCOUNTER — Other Ambulatory Visit: Payer: Self-pay

## 2021-12-23 ENCOUNTER — Encounter: Payer: Self-pay | Admitting: Internal Medicine

## 2021-12-23 NOTE — Progress Notes (Addendum)
Spoke w/ via phone for pre-op interview: patient Lab needs dos: I-Stat Lab results: Echo 04/18/20 (EF 50-55%), EKG 10/15/21 COVID test: patient states asymptomatic no test needed. Arrive at 08:30 am 12/30/21 NPO after midnight except clear liquids.Clear liquids from MN until 7:30am. Med rec completed. Medications to take morning of surgery: flecainide, omeprazole, amlodipine, metoprolol, flomax, and oxybutynin. Diabetic medication: NA Patient instructed to bring photo id and insurance card day of surgery. Patient aware to have Driver (ride ) / caregiver for 24 hours after surgery. Patient Special Instructions: Follow surgeon and cardiologist instructions for holding [place].  Pre-Op special Istructions: Pacemaker device orders requested through IB message. Patient verbalized understanding of instructions that were given at this phone interview. Patient denies shortness of breath, chest pain, fever, cough at this phone interview.

## 2021-12-23 NOTE — Progress Notes (Signed)
[facility] PROGRAMMING  Patient Information: Name:  [name]  DOB:  04/06/1941  MRN:  [id number]    Larose Hires, RN  P Cv Div [facility] Device Cc: Burnard Leigh, RN Planned Procedure:    CYSTOSCOPY WITH RETROGRADE PYELOGRAM, URETEROSCOPY, STONE EXTRACTION AND STENT PLACEMENT - Left  HOLMIUM LASER APPLICATION - Left  Surgeon:  Dr. Cathlean Cower  Date of Procedure:  12/30/21  Position during surgery:  lithotomy   Please send documentation back to:  [place] (Fax # [phone])   Larose Hires, RN  12/23/2021 10:27 AM  Device Information:  Clinic EP Physician:  Thompson Grayer, MD   Device Type:  Pacemaker Manufacturer and Phone #:  [employer]: [phone] Pacemaker Dependent?:  Yes.   Date of Last Device Check:  11/19/21 Normal Device Function?:  Yes.    Electrophysiologist's Recommendations:  Have magnet available. Provide continuous ECG monitoring when magnet is used or reprogramming is to be performed.  Procedure may interfere with device function.  Magnet should be placed over device during procedure.  Per Device Clinic Standing Orders, York Ram, RN  5:17 PM 12/23/2021

## 2021-12-26 ENCOUNTER — Other Ambulatory Visit: Payer: Self-pay | Admitting: Internal Medicine

## 2021-12-26 NOTE — Telephone Encounter (Signed)
Please refill as per office routine med refill policy (all routine meds to be refilled for 3 mo or monthly (per pt preference) up to one year from last visit, then month to month grace period for 3 mo, then further med refills will have to be denied) ? ?

## 2021-12-29 NOTE — H&P (Signed)
have blood in my urine.  HPI: [name] is a 81 year-old male established patient who is here for blood in the urine.   He did not see the blood in his urine. The blood was first detected 04/23/2017. He has not been told that they had blood in the urine in the past.   He does have a history of smoking. He does not have a history of exposure to chemicals or fumes. He does not have a history of urinary infections. He does have a burning sensation when he urinates.   12/07/17: He has not experienced any gross hematuria.     CC: I get up too often at night to urinate.  HPI: He usually gets up at night to urinate 4 times. The patient states they urinate a small amount at night each time. He does not have nights when he does not get up to urinate at all.   He does not usually have swelling in his hands and feet during the day.   He continues to have difficulty with primarily nocturia. He said he gets up about 4 times at night. He has been tried on multiple medications including Enablex, Toviaz, oxybutynin ER and Toviaz in the past. These did not result in significant improvement. He continues to have nocturia as well as some daytime frequency but is primarily bothered by getting up at night. He was unable to get PTNS because he has a pacemaker.   -12/25/20-patient is a 81 year old African American male previously followed by Dr. Karsten Ro with history of BPH with significant nocturia. He has been on several anticholinergics which did not result in any significant improvement. He is not a candidate for PTNS because he has a pacemaker. He currently is on oxybutynin XL 10 mg daily and tamsulosin 0.4 mg b.i.d.. The patient recently had episode of painless gross hematuria approximately 2 weeks ago which lasted for 2-3 days. His PCP stop the Xarelto and urine cleared. He is now back on Xarelto and urine has stayed clear visually. He does have smoking history 2 packs per week for some 20 years but quit at age 65. He  is scheduled for CT scan of the upper urinary tract per his PCP today agrees for imaging.  Micro urinalysis today shows 3-10 RBCs  -01/08/21 patient with history of gross painless hematuria as above. Had CT scan in the interim per his PCP is reviewed. Shows a 6 mm left upper ureteral/UPJ stone with minimal obstructive changes and a nonobstructing 5 mm left middle call caliceal stone. See report below. Here for cysto to assess bladder. The patient has denied any flank pain or further hematuria.  Cystoscopy is performed today and shows mild bilobar prostatic hypertrophy bladder is normal without mucosal lesions.  KUB is performed today and reviewed, this shows approximate 6-7 mm cylindrical calcification at the region of L2 on the left likely representing calculus in the proximal ureter as seen on previous CT scan. Also seen is a calcification overlying the middle calyx area of the renal contour of the left.   CLINICAL DATA: 81 year old male with hematuria.   EXAM:  CT ABDOMEN AND PELVIS WITHOUT CONTRAST   TECHNIQUE:  Multidetector CT imaging of the abdomen and pelvis was performed  following the standard protocol without IV contrast.   COMPARISON: CT abdomen pelvis dated 05/03/2017.   FINDINGS:  Evaluation of this exam is limited in the absence of intravenous  contrast.   Lower chest: There is mild eventration of the left  hemidiaphragm  with minimal left lung base atelectasis. The visualized lung bases  are otherwise clear. Cardiac pacemaker wires noted.   No intra-abdominal free air or free fluid.   Hepatobiliary: The liver is unremarkable. No intrahepatic biliary  dilatation. The gallbladder is unremarkable.   Pancreas: No acute findings. No inflammatory changes.   Spleen: Normal in size without focal abnormality.   Adrenals/Urinary Tract: The adrenal glands unremarkable. There is a  5 mm nonobstructing left renal inferior pole calculus. There is a 7  mm left UPJ stone. No  hydronephrosis. There is no hydronephrosis or  nephrolithiasis on the right. The right ureter and urinary bladder  appear unremarkable.   Stomach/Bowel: There is sigmoid diverticulosis and scattered colonic  diverticula without active inflammatory changes. There is no bowel  obstruction or active inflammation. The appendix is normal.   Vascular/Lymphatic: Advanced aortoiliac atherosclerotic disease. The  IVC is unremarkable. No portal venous gas. There is no adenopathy.  Several scattered mesenteric calcifications, likely old granuloma.   Reproductive: The prostate and seminal vesicles are grossly  unremarkable. No pelvic mass.   Other: None   Musculoskeletal: Degenerative changes of the spine. No acute osseous  pathology.   IMPRESSION:  1. A 5 mm left renal inferior pole calculus and a 7 mm left UPJ  stone. No hydronephrosis.  2. Colonic diverticulosis. No bowel obstruction. Normal appendix.  3. Aortic Atherosclerosis (ICD10-I70.0).   -01/21/21-patient with history of 7 mm left UPJ stone as above. Has been on tamsulosin as medical expulsive therapy. Here for follow-up KUB to assess progression of stone. The patient has not passed a definitive stone in the interim. No pains or recurrent hematuria.  KUB is reviewed today shows no obvious stone in the region of L3 a seen on prior KUB but there is some obscuring bowel gas which makes this interpretation somewhat difficult.  Micro analysis shows 3-10 RBCs.  -02/21/21-patient with history of 7 mm left UPJ stone on tamsulosin as medical expulsive therapy. Stone not visible on last KUB 01/21/2021 but had obscuring bowel gas overlying the renal shadow. Patient has had no interim pain or GU issues. Here for follow-up KUB.  KUB is reviewed today and shows what appears to be in ovoid calcification at the area of the transverse process of L4 on the left consistent with stone seen on prior CT scan. This stone was not visible on prior KUB due to  overlying bowel gas but I think this is the the stone that has progressed down slightly from original KUB back in February.   -08/22/21-patient with history of left upper ureteral calculus as above. Was seen back in April 2022 and we a contemplate ureteroscopy and laser lithotripsy. He was cleared by Cardiology but never scheduled for procedure. In the interim the patient has had no it significant flank pain nausea vomiting fever or gross hematuria. He states he did not schedule surgery cousin is wife had surgery and he was taking care of her.  KUB is reviewed today and again shows no obvious bony abnormality. There are some persistent calcifications in the region of L3-4 on the left measuring about 4 mm or so and probably some other calcifications overlying the left renal shadow.  -09/10/21-patient with history of left upper ureteral calculus. KUB on 08/22/2021 suggested persistence of the stone but CT urogram was ordered to confirm stones in the ureter. CT urogram is reviewed today and shows there is a 4 mm left proximal stone with hydronephrosis consistent with this calcification seen  on KUB. See report below. The patient has had some intermittent lower right-sided back pain but no significant pain on the left.   CLINICAL DATA: Re-evaluation of renal stone burden.   EXAM:  CT ABDOMEN AND PELVIS WITHOUT CONTRAST   TECHNIQUE:  Multidetector CT imaging of the abdomen and pelvis was performed  following the standard protocol without IV contrast.   COMPARISON: December 25, 2020 CT   FINDINGS:  Lower chest: Eventration left hemidiaphragm. Opacities in the left  lung base may reflect atelectasis versus infiltrate. Partially  visualized intracardiac leads.   Hepatobiliary: Scattered calcified hepatic granulomata. Otherwise  unremarkable noncontrast appearance of the hepatic parenchyma.  Gallbladder is unremarkable. No biliary ductal dilation.   Pancreas: No pancreatic ductal dilation or evidence of  acute  inflammation.   Spleen: Within normal limits.   Adrenals/Urinary Tract: Bilateral adrenal glands are unremarkable.   Left hydroureteronephrosis to the level of a 4 mm stone in the  proximal ureter at the L4 vertebral body level. Additional  nonobstructive 4 mm left interpolar renal stone. Right kidneys  unremarkable. Urinary bladder is decompressed limiting evaluation.   Stomach/Bowel: No enteric contrast was administered. Stomach is  decompressed limiting evaluation. No pathologic dilation of small or  large bowel. The appendix and terminal ileum appear normal. Colonic  diverticulosis without findings of acute diverticulitis.   Vascular/Lymphatic: Aortic and branch vessel atherosclerosis without  abdominal aortic aneurysm. No pathologically enlarged abdominal or  pelvic lymph nodes. Scattered calcified retroperitoneal lymph nodes,  sequela of prior granulomatous infection.   Reproductive: Prostate is unremarkable.   Other: No abdominopelvic ascites. Small fat containing right  inguinal hernia.   Musculoskeletal: Multilevel degenerative changes spine. No acute  osseous abnormality.   IMPRESSION:  1. Left hydroureteronephrosis to the level of a 4 mm stone in the  proximal ureter at the L4 vertebral body level.  2. Additional nonobstructive 4 mm left interpolar renal stone.  3. Opacities in the left lung base may reflect atelectasis versus  infiltrate.  4. Colonic diverticulosis without findings of acute diverticulitis.  5. Aortic Atherosclerosis (ICD10-I70.0).    Electronically Signed  By: Dahlia Bailiff M.D.  On: 09/09/2021 07:29  -10/20/21-with left mid ureteral calculus status post recent in situ ESLby Dr. Gloriann Loan 28 2022. No postprocedural issues. The patient has had no pain. He has not passed any significant stone fragments.  KUB is reviewed today and shows no obvious bony abnormality. There is persistent calcification the region of the L4 transverse process  consistent with the stone that was treated. No significant progression or change in the stone.   11/21/2021: Back today for follow-up exam with KUB. He denies any interval stone material passage. He has had no recurrence of left-sided pain/discomfort suggestive of obstructive uropathy. Beginning last week he developed some intermittent gross hematuria which correlates with today's UA findings. Outside of this he is not having any bothersome increase in frequency/urgency from baseline. No increased sensation of incomplete bladder emptying, he denies any painful or burning urination. KUB today shows distal migration of the previously identified left proximal ureteral stone, now seen just lateral to the left sacral wing above the anatomical expected location of the left UVJ.   12/05/2021: Back today for follow-up exam, he denies interval stone material passage. He continues to endorse urine color changes indicative of gross hematuria. Voiding symptoms grossly unchanged from previous baseline, he has had no dysuria. He has not had any persistent or worsening left-sided pain or discomfort. No interval fevers or chills, nausea/vomiting.  He does remain on Xarelto. KUB today continues to show an opacity of similar size/location from imaging study 2 weeks ago indicative of a distal left ureteral calculi overlying the superior portion of the left sacral wing. Sensitivity is limited due to numerous benign bony opacities/phleboliths.     ALLERGIES: No Allergies    MEDICATIONS: Omeprazole 40 mg capsule,delayed release  Sildenafil Citrate 20 mg tablet 1-5 tablet PO PRN  Tamsulosin Hcl 0.4 mg capsule  Advair Diskus 250 mcg-50 mcg/dose blister, with inhalation device  Amlodipine Besylate 5 mg tablet  Atorvastatin Calcium 80 mg tablet tablet  Clobetasol Emollient 0.05 % cream  Dorzolamide-Timolol 22.3 mg-6.8 mg/ml drops  Fexofenadine Hcl 180 mg tablet  Flecainide Acetate 50 mg tablet  Latanoprost 0.005 % drops   Meclizine Hcl 12.5 mg tablet  Metamucil 0.4 gram capsule  Oxybutynin Chloride Er 10 mg tablet, extended release 24 hr  Sildenafil Citrate 20 mg tablet Take 1-5 tabs po qd prn ED  Sildenafil Citrate 20 mg tablet Take 1-5 tabs po qd prn ED  Telmisartan 20 mg tablet  Tramadol Hcl 50 mg tablet  Tylenol  Xarelto 20 mg tablet     GU PSH: Cystoscopy - 01/08/2021 ESWL - 10/13/2021     NON-GU PSH: Pacemaker placement         GU PMH: Ureteral calculus - 11/21/2021, - 10/20/2021 (Stable), - 09/10/2021, - 09/08/2021 (Stable), - 08/22/2021, - 02/21/2021, - 01/22/2021, - 01/08/2021 Renal calculus - 10/20/2021, - 08/22/2021 Gross hematuria - 09/10/2021, - 02/21/2021, - 01/22/2021, - 01/08/2021, - 12/25/2020 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 - 2019 Nocturia (Stable), We discussed trying an anticholinergic that he has not tried previously. I was going to try a combination with Myrbetriq but that interacts with his flecainide. I told him that 1 other option would be to try DDAVP at night which may reduce the number of times he has to urinate at night but may cause a little bit more frequency in the daytime. - 2019, (Worsening), He is experiencing significant nocturia. We discussed the options for treatment and I have discussed Botox with him as an option but he did not want to consider that. The other option was a posterior tibial nerve stimulation., - 2018, Nocturia, - 2014 Overactive bladder, Detrusor instability - 2015 Urinary Frequency, Increased urinary frequency - 2015 GU systems Signs/Symptoms , Unspec, Lower urinary tract symptoms (LUTS) - 39 Male ED, unspecified, Erectile dysfunction - 2015 Encounter for Prostate Cancer screening, Prostate cancer screening - 2014 Epigastric pain, Abdominal Pain In The Central Upper Belly (Epigastric) - 2014 Hematospermia, Hematospermia - 2014 History of urolithiasis, Nephrolithiasis -  2014 Inflammatory Disease Prostate, Unspec, Prostatitis - 2014      PMH 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 He was evaluated with cystoscopy with no irritative focus found within the bladder and therefore I started him on a trial of 2 different anticholinergics (Enablex 15 mg and Toviaz 8 mg). We discussed PTNS as an option if these were not effective.   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 He has since used oral agents effectively for this condition using both Viagra and Cialis.   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 Rosana Hoes with IVP which was noted to be normal and cystoscopy which was negative.   He has a distant history of having had prostatitis.     NON-GU PMH: Encounter for general adult medical examination without abnormal findings, Encounter for preventive health examination - 2015 Benign neoplasm of sigmoid colon, Polyps Of The Sigmoid Colon - 2014 Diverticulosis, Diverticulosis - 2014 Personal history of other diseases of the circulatory system, History of hypertension - 2014, History of sinus bradycardia, - 2014 Personal history of other diseases of the digestive system, History of esophageal reflux - 2014 Personal history of other endocrine, nutritional and metabolic disease, History of hypercholesterolemia - 2014 Personal history of other mental and behavioral disorders, History of depression - 2014 Personal history of other specified conditions, History of nausea - 2014, History of fatigue, - 2014 Arthritis GERD Glaucoma Hypercholesterolemia Hypertension    FAMILY HISTORY: 1 Daughter - Daughter 1 son -  Son Death In The Family Father - Runs In Family Family Health Status Number - Runs In Family Lung Cancer - Father malignant neoplasm - Runs In Family   SOCIAL HISTORY: Marital Status: Married Preferred Language: English; Ethnicity: Not Hispanic Or Latino; Race: Black or African American Current Smoking Status: Patient does not smoke anymore.  <DIV'  Tobacco Use Assessment Completed:  Used Tobacco in last 30 days?   Does not use smokeless tobacco. Has never drank.  Does not use drugs. Drinks 1 caffeinated drink per day. Has not had a blood transfusion.     Notes: Former smoker, Occupation:, Caffeine Use, Alcohol Use, Marital History - Currently Married, Tobacco Use   REVIEW OF SYSTEMS:     GU Review Male:  Patient reports frequent urination, hard to postpone urination, and get up at night to urinate. Patient denies burning/ pain with urination, leakage of urine, stream starts and stops, trouble starting your stream, have to strain to urinate , erection problems, and penile pain.    Gastrointestinal (Upper):  Patient denies nausea, vomiting, and indigestion/ heartburn.    Gastrointestinal (Lower):  Patient denies diarrhea and constipation.    Constitutional:  Patient denies fever, night sweats, weight loss, and fatigue.    Skin:  Patient denies skin rash/ lesion and itching.    Eyes:  Patient denies blurred vision and double vision.    Ears/ Nose/ Throat:  Patient denies sore throat and sinus problems.    Hematologic/Lymphatic:  Patient denies swollen glands and easy bruising.    Cardiovascular:  Patient denies leg swelling and chest pains.    Respiratory:  Patient denies cough and shortness of breath.    Endocrine:  Patient denies excessive thirst.    Musculoskeletal:  Patient reports back pain and joint pain.     Neurological:  Patient denies headaches and dizziness.    Psychologic:  Patient denies depression and anxiety.    Notes: Updated from previous visit 11/21/2021 with review  from patient as noted above.    VITAL SIGNS:       12/05/2021 01:48 PM     Weight 200 lb / 90.72 kg     Height 69 in / 175.26 cm     BP 109/72 mmHg     Pulse 70 /min     Temperature 98.2 F / 36.7 C     BMI 29.5 kg/m     MULTI-SYSTEM PHYSICAL EXAMINATION:      Constitutional: Well-nourished. No physical deformities. Normally developed. Good grooming.     Neck: Neck symmetrical, not swollen.  Normal tracheal position.     Respiratory: No labored breathing, no use of accessory muscles.      Cardiovascular: Normal temperature, normal extremity pulses, no swelling, no varicosities.     Skin: No paleness, no jaundice, no cyanosis. No lesion, no ulcer, no rash.     Neurologic / Psychiatric: Oriented to time, oriented to place, oriented to person. No depression, no anxiety, no agitation.     Gastrointestinal: No mass, no tenderness, no rigidity, non obese abdomen. No CVA or flank tenderness     Musculoskeletal: Normal gait and station of head and neck.            Complexity of Data:   Source Of History:  Patient, Medical Record Summary  Records Review:  Previous Doctor Records, Previous Hospital Records, Previous Patient Records  Urine Test Review:  Urinalysis, Urine Culture  X-Ray Review: KUB: Reviewed Films. Discussed With Patient.  C.T. Abdomen/Pelvis: Reviewed Films. Reviewed Report.      11/18/16  PSA  Total PSA 2.05 ng/dl    PROCEDURES:    KUB - 74018  A single view of the abdomen is obtained. Overlying bowel gas and stool pattern as well as numerous benign bony lesions decreased sensitivity of today's KUB in similar fashion as prior imaging studies. The opacity identified as a left ureteral calculi which had previously been noted to transition from the proximal to the distal ureter is still identifiable today. Located overlying the superior portion of the left sacral wing.      Patient confirmed No Neulasta OnPro Device.       Urinalysis w/Scope  Dipstick Dipstick  Cont'd Micro  Color: Yellow Bilirubin: Neg mg/dL WBC/hpf: NS (Not Seen)  Appearance: Clear Ketones: Neg mg/dL RBC/hpf: 3 - 10/hpf  Specific Gravity: 1.020 Blood: 1+ ery/uL Bacteria: Rare (0-9/hpf)  pH: 7.0 Protein: Neg mg/dL Cystals: NS (Not Seen)  Glucose: Neg mg/dL Urobilinogen: 1.0 mg/dL Casts: NS (Not Seen)   Nitrites: Neg Trichomonas: Not Present   Leukocyte Esterase: Neg leu/uL Mucous: Not Present    Epithelial Cells: NS (Not Seen)    Yeast: NS (Not Seen)    Sperm: Not Present    ASSESSMENT:     ICD-10 Details  1 GU:  Ureteral calculus - N20.1    PLAN:   Orders  Labs Urine Culture  X-Rays: C.T. Stone Protocol Without I.V. Contrast - Inconclusive KUB on f/u exam s/p shockwave lithotripsy performed in late November with out interval stone material passage, continued hematuria.  Schedule  Return Visit/Planned Activity: Other See Visit Notes - F/U Pending Results, Follow up MD, Schedule Surgery  Document  Letter(s):  Created for Patient: Clinical Summary   Notes:  KUB somewhat more inconclusive today than at time of previous assessment but it definitely suggests he still has a left ureteral calculi overlying the mid to superior portion of the left sacral wing, grossly unchanged in location from exam 2 weeks ago. UA today continues to show microscopic hematuria, decreased from last assessment. I did send a urine culture. He has been endorsing color changes in the urine indicative of mild gross hematuria. Urine culture at last office visit was negative for bacterial growth. Fortunately patient is grossly asymptomatic otherwise. As previously mentioned, he would benefit from undergoing definitive ureteroscopy. I will discuss/review with his urologist and then patient will be contacted and scheduled appropriately. He is on Xarelto so I may likely need to have clearance to come off of that again for the procedure as well in similar  fashion as when he had shockwave lithotripsy back in late  November.   For ureteroscopy I described the risks which include heart attack, stroke, pulmonary embolus, death, bleeding, infection, damage to contiguous structures, positioning injury, ureteral stricture, ureteral avulsion, ureteral injury, need for ureteral stent, inability to perform ureteroscopy, need for an interval procedure, inability to clear stone burden, stent discomfort and pain.    Imaging reviewed with patient's urologist. Given the inconclusive nature of KUB, he is recommended repeat CT stone protocol study before scheduling for ureteroscopy. The patient will be contacted and scheduled for urgent imaging follow-up. Based on that result, ureteroscopy will be scheduled or instructions regarding further follow-up including potential hematuria evaluation will occur.  Addendum, repeat CT urogram confirmed 2 stones present in the left distal ureter with minimal hydronephrosis.  Plan for cystoscopy left ureteroscopy with laser lithotripsy and stone extraction

## 2021-12-30 ENCOUNTER — Ambulatory Visit (HOSPITAL_BASED_OUTPATIENT_CLINIC_OR_DEPARTMENT_OTHER): Payer: Medicare Other | Admitting: Anesthesiology

## 2021-12-30 ENCOUNTER — Encounter (HOSPITAL_BASED_OUTPATIENT_CLINIC_OR_DEPARTMENT_OTHER): Payer: Self-pay | Admitting: Urology

## 2021-12-30 ENCOUNTER — Ambulatory Visit (HOSPITAL_BASED_OUTPATIENT_CLINIC_OR_DEPARTMENT_OTHER)
Admission: RE | Admit: 2021-12-30 | Discharge: 2021-12-30 | Disposition: A | Discharge status: Home / Self Care | Payer: Medicare Other | Attending: Urology | Admitting: Urology

## 2021-12-30 ENCOUNTER — Encounter (HOSPITAL_BASED_OUTPATIENT_CLINIC_OR_DEPARTMENT_OTHER)
Admission: RE | Disposition: A | Discharge status: Home / Self Care | Payer: Self-pay | Source: Home / Self Care | Attending: Urology

## 2021-12-30 DIAGNOSIS — I10 Essential (primary) hypertension: Secondary | ICD-10-CM

## 2021-12-30 DIAGNOSIS — Z950 Presence of cardiac pacemaker: Secondary | ICD-10-CM | POA: Insufficient documentation

## 2021-12-30 DIAGNOSIS — K219 Gastro-esophageal reflux disease without esophagitis: Secondary | ICD-10-CM | POA: Insufficient documentation

## 2021-12-30 DIAGNOSIS — J449 Chronic obstructive pulmonary disease, unspecified: Secondary | ICD-10-CM

## 2021-12-30 DIAGNOSIS — I4891 Unspecified atrial fibrillation: Secondary | ICD-10-CM | POA: Diagnosis not present

## 2021-12-30 DIAGNOSIS — N400 Enlarged prostate without lower urinary tract symptoms: Secondary | ICD-10-CM | POA: Diagnosis not present

## 2021-12-30 DIAGNOSIS — Z87891 Personal history of nicotine dependence: Secondary | ICD-10-CM | POA: Insufficient documentation

## 2021-12-30 DIAGNOSIS — Z87442 Personal history of urinary calculi: Secondary | ICD-10-CM | POA: Insufficient documentation

## 2021-12-30 DIAGNOSIS — E669 Obesity, unspecified: Secondary | ICD-10-CM | POA: Diagnosis not present

## 2021-12-30 DIAGNOSIS — Z09 Encounter for follow-up examination after completed treatment for conditions other than malignant neoplasm: Secondary | ICD-10-CM | POA: Insufficient documentation

## 2021-12-30 DIAGNOSIS — Z6830 Body mass index (BMI) 30.0-30.9, adult: Secondary | ICD-10-CM | POA: Insufficient documentation

## 2021-12-30 DIAGNOSIS — N201 Calculus of ureter: Secondary | ICD-10-CM

## 2021-12-30 HISTORY — PX: CYSTOSCOPY WITH RETROGRADE PYELOGRAM, URETEROSCOPY AND STENT PLACEMENT: SHX5789

## 2021-12-30 LAB — POCT I-STAT, CHEM 8
BUN: 17 mg/dL (ref 8–23)
Calcium, Ion: 1.27 mmol/L (ref 1.15–1.40)
Chloride: 105 mmol/L (ref 98–111)
Creatinine, Ser: 1 mg/dL (ref 0.61–1.24)
Glucose, Bld: 107 mg/dL — ABNORMAL HIGH (ref 70–99)
HCT: 46 % (ref 39.0–52.0)
Hemoglobin: 15.6 g/dL (ref 13.0–17.0)
Potassium: 3.9 mmol/L (ref 3.5–5.1)
Sodium: 143 mmol/L (ref 135–145)
TCO2: 26 mmol/L (ref 22–32)

## 2021-12-30 SURGERY — CYSTOURETEROSCOPY, WITH RETROGRADE PYELOGRAM AND STENT INSERTION
Anesthesia: General | Site: Renal | Laterality: Left

## 2021-12-30 MED ORDER — DEXAMETHASONE SODIUM PHOSPHATE 10 MG/ML IJ SOLN
INTRAMUSCULAR | Status: DC | PRN
  Administered 2021-12-30: 5 mg via INTRAVENOUS

## 2021-12-30 MED ORDER — ACETAMINOPHEN 500 MG PO TABS
1000.0000 mg | ORAL_TABLET | Freq: Once | ORAL | Status: AC
  Administered 2021-12-30: 1000 mg via ORAL

## 2021-12-30 MED ORDER — MIDAZOLAM HCL 2 MG/2ML IJ SOLN: 0.5000 mg | Freq: Once | INTRAMUSCULAR | Status: DC | PRN

## 2021-12-30 MED ORDER — EPHEDRINE 5 MG/ML INJ
INTRAVENOUS | Status: AC
  Filled 2021-12-30: qty 5

## 2021-12-30 MED ORDER — EPHEDRINE SULFATE-NACL 50-0.9 MG/10ML-% IV SOSY
PREFILLED_SYRINGE | INTRAVENOUS | Status: DC | PRN
  Administered 2021-12-30: 10 mg via INTRAVENOUS

## 2021-12-30 MED ORDER — PHENYLEPHRINE 40 MCG/ML (10ML) SYRINGE FOR IV PUSH (FOR BLOOD PRESSURE SUPPORT)
PREFILLED_SYRINGE | INTRAVENOUS | Status: AC
  Filled 2021-12-30: qty 10

## 2021-12-30 MED ORDER — PROPOFOL 10 MG/ML IV BOLUS
INTRAVENOUS | Status: AC
  Filled 2021-12-30: qty 20

## 2021-12-30 MED ORDER — PHENYLEPHRINE HCL (PRESSORS) 10 MG/ML IV SOLN
INTRAVENOUS | Status: DC | PRN
Start: 2021-12-30 — End: 2021-12-30
  Administered 2021-12-30 (×2): 80 ug via INTRAVENOUS

## 2021-12-30 MED ORDER — SODIUM CHLORIDE 0.9 % IR SOLN
Status: DC | PRN
  Administered 2021-12-30: 3000 mL

## 2021-12-30 MED ORDER — KETOROLAC TROMETHAMINE 30 MG/ML IJ SOLN
INTRAMUSCULAR | Status: AC
  Filled 2021-12-30: qty 1

## 2021-12-30 MED ORDER — CEFAZOLIN SODIUM-DEXTROSE 2-4 GM/100ML-% IV SOLN
2.0000 g | INTRAVENOUS | Status: AC
  Administered 2021-12-30: 2 g via INTRAVENOUS

## 2021-12-30 MED ORDER — LIDOCAINE HCL (PF) 2 % IJ SOLN
INTRAMUSCULAR | Status: AC
  Filled 2021-12-30: qty 5

## 2021-12-30 MED ORDER — ONDANSETRON HCL 4 MG/2ML IJ SOLN
INTRAMUSCULAR | Status: DC | PRN
  Administered 2021-12-30: 4 mg via INTRAVENOUS

## 2021-12-30 MED ORDER — PROPOFOL 10 MG/ML IV BOLUS
INTRAVENOUS | Status: DC | PRN
  Administered 2021-12-30: 200 mg via INTRAVENOUS

## 2021-12-30 MED ORDER — IOHEXOL 300 MG/ML  SOLN
INTRAMUSCULAR | Status: DC | PRN
  Administered 2021-12-30: 7 mL

## 2021-12-30 MED ORDER — FENTANYL CITRATE (PF) 100 MCG/2ML IJ SOLN
INTRAMUSCULAR | Status: AC
  Filled 2021-12-30: qty 2

## 2021-12-30 MED ORDER — CEFAZOLIN SODIUM-DEXTROSE 2-4 GM/100ML-% IV SOLN
INTRAVENOUS | Status: AC
  Filled 2021-12-30: qty 100

## 2021-12-30 MED ORDER — MEPERIDINE HCL 25 MG/ML IJ SOLN: 6.2500 mg | INTRAMUSCULAR | Status: DC | PRN

## 2021-12-30 MED ORDER — LACTATED RINGERS IV SOLN: INTRAVENOUS | Status: DC

## 2021-12-30 MED ORDER — FENTANYL CITRATE (PF) 100 MCG/2ML IJ SOLN: 25.0000 ug | INTRAMUSCULAR | Status: DC | PRN

## 2021-12-30 MED ORDER — ONDANSETRON HCL 4 MG/2ML IJ SOLN
INTRAMUSCULAR | Status: AC
  Filled 2021-12-30: qty 2

## 2021-12-30 MED ORDER — KETOROLAC TROMETHAMINE 30 MG/ML IJ SOLN
INTRAMUSCULAR | Status: DC | PRN
  Administered 2021-12-30: 30 mg via INTRAVENOUS

## 2021-12-30 MED ORDER — OXYCODONE HCL 5 MG PO TABS: 5.0000 mg | ORAL_TABLET | Freq: Once | ORAL | Status: DC | PRN

## 2021-12-30 MED ORDER — PROMETHAZINE HCL 25 MG/ML IJ SOLN: 6.2500 mg | INTRAMUSCULAR | Status: DC | PRN

## 2021-12-30 MED ORDER — DEXAMETHASONE SODIUM PHOSPHATE 10 MG/ML IJ SOLN
INTRAMUSCULAR | Status: AC
  Filled 2021-12-30: qty 1

## 2021-12-30 MED ORDER — OXYCODONE HCL 5 MG/5ML PO SOLN: 5.0000 mg | Freq: Once | ORAL | Status: DC | PRN

## 2021-12-30 MED ORDER — FENTANYL CITRATE (PF) 100 MCG/2ML IJ SOLN
INTRAMUSCULAR | Status: DC | PRN
  Administered 2021-12-30: 50 ug via INTRAVENOUS

## 2021-12-30 MED ORDER — LIDOCAINE 2% (20 MG/ML) 5 ML SYRINGE
INTRAMUSCULAR | Status: DC | PRN
  Administered 2021-12-30: 40 mg via INTRAVENOUS

## 2021-12-30 MED ORDER — ACETAMINOPHEN 500 MG PO TABS
ORAL_TABLET | ORAL | Status: AC
  Filled 2021-12-30: qty 2

## 2021-12-30 SURGICAL SUPPLY — 27 items
BAG DRAIN URO-CYSTO SKYTR STRL (DRAIN) ×4 IMPLANT
BAG DRN UROCATH (DRAIN) ×2
BASKET STONE 1.7 NGAGE (UROLOGICAL SUPPLIES) IMPLANT
BULB IRRIG PATHFIND (MISCELLANEOUS) ×2 IMPLANT
CATH URET 5FR 28IN OPEN ENDED (CATHETERS) ×4 IMPLANT
CLOTH BEACON ORANGE TIMEOUT ST (SAFETY) ×4 IMPLANT
EXTRACTOR STONE 1.7FRX115CM (UROLOGICAL SUPPLIES) IMPLANT
FIBER LASER FLEXIVA 365 (UROLOGICAL SUPPLIES) IMPLANT
GLOVE SURG ENC MOIS LTX SZ7.5 (GLOVE) ×4 IMPLANT
GLOVE SURG UNDER POLY LF SZ7 (GLOVE) ×4 IMPLANT
GOWN STRL REUS W/TWL XL LVL3 (GOWN DISPOSABLE) ×6 IMPLANT
GUIDEWIRE STR DUAL SENSOR (WIRE) ×4 IMPLANT
GUIDEWIRE ZIPWRE .038 STRAIGHT (WIRE) IMPLANT
IV NS 1000ML (IV SOLUTION)
IV NS 1000ML BAXH (IV SOLUTION) ×2 IMPLANT
IV NS IRRIG 3000ML ARTHROMATIC (IV SOLUTION) ×4 IMPLANT
KIT TURNOVER CYSTO (KITS) ×4 IMPLANT
MANIFOLD NEPTUNE II (INSTRUMENTS) ×4 IMPLANT
NS IRRIG 500ML POUR BTL (IV SOLUTION) ×2 IMPLANT
PACK CYSTO (CUSTOM PROCEDURE TRAY) ×4 IMPLANT
STENT URET 6FRX26 CONTOUR (STENTS) IMPLANT
SYR 10ML LL (SYRINGE) ×4 IMPLANT
SYR 20ML LL LF (SYRINGE) ×4 IMPLANT
TRACTIP FLEXIVA PULS ID 200XHI (Laser) IMPLANT
TRACTIP FLEXIVA PULSE ID 200 (Laser)
TUBE CONNECTING 12X1/4 (SUCTIONS) ×2 IMPLANT
TUBING UROLOGY SET (TUBING) ×4 IMPLANT

## 2021-12-30 NOTE — Transfer of Care (Signed)
Immediate Anesthesia Transfer of Care Note  Patient: [name]  Procedure(s) Performed: Procedure(s) (LRB): CYSTOSCOPY WITH RETROGRADE PYELOGRAM, URETEROSCOPY (Left)  Patient Location: PACU  Anesthesia Type: General  Level of Consciousness: awake, oriented, sedated and patient cooperative  Airway & Oxygen Therapy: Patient Spontanous Breathing and Patient connected to face mask oxygen  Post-op Assessment: Report given to PACU RN and Post -op Vital signs reviewed and stable  Post vital signs: Reviewed and stable  Complications: No apparent anesthesia complications Last Vitals:  Vitals Value Taken Time  BP 134/90 12/30/21 1042  Temp 36.4 C 12/30/21 1042  Pulse 61 12/30/21 1042  Resp 16 12/30/21 1042  SpO2 97 % 12/30/21 1042    Last Pain:  Vitals:   12/30/21 1042  TempSrc:   PainSc: 0-No pain      Patients Stated Pain Goal: 5 (88/67/73 7366)  Complications: No notable events documented.

## 2021-12-30 NOTE — Op Note (Signed)
Preoperative diagnosis:  1.  Left ureteral calculi  Postoperative diagnosis: 1.  Passed left ureteral calculi no evidence of remaining stones  Procedure(s): 1.  Cystoscopy, left retrograde pyelogram with intraoperative interpretation, left ureteroscopy  Surgeon: Dr. Harold Barban  Anesthesia: General  Complications: None  EBL: Minimal  Specimens: None  Disposition of specimens: Not applicable  Intraoperative findings: Retrograde pyelogram on left confirmed no obvious filling defect.  Ureteroscopy up to left UPJ showed no stones.  Ureteroscopy atraumatic no stent placed  Indication: Patient is a 81 year old African-American male with history of left ureteral calculus status post ESL.  Patient had obstructing fragments which passed distally but failed to progress.  Had intermittent pain and hematuria.  Presents at this time to undergo cystoscopy left ureteroscopy and laser lithotripsy with stone extraction.  Description of procedure:  After obtaining for consent for the patient he was taken the major cystoscopy suite placed under general anesthesia.  Placed in the dorsolithotomy position genitalia prepped and draped in usual sterile fashion.  Proper pause and timeout was performed for site of procedure.  52 [place] the scope was advanced in the bladder without difficulty.  Pendulous prostatic urethra appeared grossly normal had mild bilobar prostatic hypertrophy.  Bladder appeared grossly normal left ureteral orifice was somewhat patulous but effluxing clear urine.  Retrograde pyelogram was performed on the left side with a 5 French open tip catheter which revealed essentially normal filling of the upper urinary tract without evidence of obvious filling defect.  No significant hydronephrosis.  A sensor guidewire was subsequently passed through the open tip catheter up to the renal pelvis without difficulty.  The open tip catheter and cystoscope was removed leaving the guidewire in place.   The 6.4 French semirigid ureteroscope was then advanced over the guidewire and atraumatically advanced inside the left ureteral orifice along the length of the scope up to the left UPJ and no stone was encountered.  The scope was removed atraumatically.  Guidewire was removed.  Bladder was emptied procedure terminated.  He was awakened from anesthesia and taken back to recovery room stable condition.  No immediate complication from the procedure.

## 2021-12-30 NOTE — Interval H&P Note (Signed)
History and Physical Interval Note:  12/30/2021 9:16 AM  [name]  has presented today for surgery, with the diagnosis of URETERAL CALCULI.  The various methods of treatment have been discussed with the patient and family. After consideration of risks, benefits and other options for treatment, the patient has consented to  Procedure(s): CYSTOSCOPY WITH RETROGRADE PYELOGRAM, URETEROSCOPY, STONE EXTRACTION AND STENT PLACEMENT (Left) HOLMIUM LASER APPLICATION (Left) as a surgical intervention.  The patient's history has been reviewed, patient examined, no change in status, stable for surgery.  I have reviewed the patient's chart and labs.  Questions were answered to the patient's satisfaction.     Remi Haggard

## 2021-12-30 NOTE — Anesthesia Postprocedure Evaluation (Signed)
Anesthesia Post Note  Patient: SHAWNDELL SCHILLACI  Procedure(s) Performed: CYSTOSCOPY WITH RETROGRADE PYELOGRAM, URETEROSCOPY (Left: Renal)     Patient location during evaluation: Phase II Anesthesia Type: General Level of consciousness: awake and alert, patient cooperative and oriented Pain management: pain level controlled Vital Signs Assessment: post-procedure vital signs reviewed and stable Respiratory status: spontaneous breathing, nonlabored ventilation and respiratory function stable Cardiovascular status: blood pressure returned to baseline and stable Postop Assessment: no apparent nausea or vomiting, adequate PO intake and able to ambulate Anesthetic complications: no   No notable events documented.  Last Vitals:  Vitals:   12/30/21 1015 12/30/21 1042  BP: 109/81 134/90  Pulse: 60 61  Resp: 14 16  Temp: (!) 36.3 C (!) 36.4 C  SpO2: 97% 97%    Last Pain:  Vitals:   12/30/21 1042  TempSrc:   PainSc: 0-No pain                 Kleber Crean,E. Kersti Scavone

## 2021-12-30 NOTE — Anesthesia Preprocedure Evaluation (Addendum)
Anesthesia Evaluation  Patient identified by MRN, date of birth, ID band Patient awake    Reviewed: Allergy & Precautions, NPO status , Patient's Chart, lab work & pertinent test results, reviewed documented beta blocker date and time   History of Anesthesia Complications Negative for: history of anesthetic complications  Airway Mallampati: I  TM Distance: >3 FB Neck ROM: Full    Dental  (+) Teeth Intact, Dental Advisory Given   Pulmonary shortness of breath and with exertion, COPD, former smoker,    breath sounds clear to auscultation       Cardiovascular hypertension, Pt. on medications and Pt. on home beta blockers (-) angina+ dysrhythmias Atrial Fibrillation + pacemaker (for SSS)  Rhythm:Regular Rate:Normal  '21 ECHO: EF 50-55%. LV has low normal function, diastolic parameters were normal. Pacing wires in RA/RV. Right ventricular systolic function is normal, Trivial MR, mild-mod TR  '21 Normal stress nuclear study with apical thinning but no ischemia.  Gated ejection fraction 55% with normal wall motion.   Neuro/Psych Depression negative neurological ROS     GI/Hepatic Neg liver ROS, GERD  Controlled,  Endo/Other  obese  Renal/GU negative Renal ROS     Musculoskeletal   Abdominal (+) + obese,   Peds  Hematology Xarelto: last dose Sat   Anesthesia Other Findings   Reproductive/Obstetrics                            Anesthesia Physical Anesthesia Plan  ASA: 3  Anesthesia Plan: General   Post-op Pain Management: Tylenol PO (pre-op)* and Minimal or no pain anticipated   Induction: Intravenous  PONV Risk Score and Plan: 2 and Ondansetron and Dexamethasone  Airway Management Planned: LMA  Additional Equipment: None  Intra-op Plan:   Post-operative Plan:   Informed Consent: I have reviewed the patients History and Physical, chart, labs and discussed the procedure including the  risks, benefits and alternatives for the proposed anesthesia with the patient or authorized representative who has indicated his/her understanding and acceptance.     Dental advisory given  Plan Discussed with: CRNA and Surgeon  Anesthesia Plan Comments:        Anesthesia Quick Evaluation

## 2021-12-30 NOTE — Discharge Instructions (Signed)
°  Post Anesthesia Home Care Instructions  Activity: Get plenty of rest for the remainder of the day. A responsible individual must stay with you for 24 hours following the procedure.  For the next 24 hours, DO NOT: -Drive a car -Paediatric nurse -Drink alcoholic beverages -Take any medication unless instructed by your physician -Make any legal decisions or sign important papers.  Meals: Start with liquid foods such as gelatin or soup. Progress to regular foods as tolerated. Avoid greasy, spicy, heavy foods. If nausea and/or vomiting occur, drink only clear liquids until the nausea and/or vomiting subsides. Call your physician if vomiting continues.  Special Instructions/Symptoms: Your throat may feel dry or sore from the anesthesia or the breathing tube placed in your throat during surgery. If this causes discomfort, gargle with warm salt water. The discomfort should disappear within 24 hours.  **No ibuprofen, Advil, Aleve, Motrin, ketorolac, meloxicam, naproxen, or other NSAIDS, or acetaminophen/Tylenol until after 3:30pm today if needed.

## 2021-12-30 NOTE — Anesthesia Procedure Notes (Signed)
Procedure Name: LMA Insertion Date/Time: 12/30/2021 9:27 AM Performed by: Suan Halter, CRNA Pre-anesthesia Checklist: Patient identified, Emergency Drugs available, Suction available and Patient being monitored Patient Re-evaluated:Patient Re-evaluated prior to induction Oxygen Delivery Method: Circle system utilized Preoxygenation: Pre-oxygenation with 100% oxygen Induction Type: IV induction Ventilation: Mask ventilation without difficulty LMA: LMA inserted LMA Size: 5.0 Number of attempts: 1 Airway Equipment and Method: Bite block Placement Confirmation: positive ETCO2 Tube secured with: Tape Dental Injury: Teeth and Oropharynx as per pre-operative assessment

## 2021-12-31 ENCOUNTER — Encounter (HOSPITAL_BASED_OUTPATIENT_CLINIC_OR_DEPARTMENT_OTHER): Payer: Self-pay | Admitting: Urology

## 2022-01-06 DIAGNOSIS — N202 Calculus of kidney with calculus of ureter: Secondary | ICD-10-CM | POA: Diagnosis not present

## 2022-01-10 ENCOUNTER — Other Ambulatory Visit: Payer: Self-pay | Admitting: Internal Medicine

## 2022-01-10 NOTE — Telephone Encounter (Signed)
Please refill as per office routine med refill policy (all routine meds to be refilled for 3 mo or monthly (per pt preference) up to one year from last visit, then month to month grace period for 3 mo, then further med refills will have to be denied) ? ?

## 2022-01-12 ENCOUNTER — Ambulatory Visit (INDEPENDENT_AMBULATORY_CARE_PROVIDER_SITE_OTHER): Payer: Medicare Other

## 2022-01-12 DIAGNOSIS — I495 Sick sinus syndrome: Secondary | ICD-10-CM

## 2022-01-13 LAB — CUP PACEART REMOTE DEVICE CHECK
Battery Remaining Longevity: 168 mo
Battery Remaining Percentage: 100 %
Brady Statistic RA Percent Paced: 99 %
Brady Statistic RV Percent Paced: 0 %
Date Time Interrogation Session: 20230228090700
Implantable Lead Implant Date: 20100719
Implantable Lead Implant Date: 20211130
Implantable Lead Location: 753859
Implantable Lead Location: 753860
Implantable Lead Model: 4136
Implantable Lead Model: 7842
Implantable Lead Serial Number: 1055910
Implantable Lead Serial Number: 28594836
Implantable Pulse Generator Implant Date: 20211130
Lead Channel Impedance Value: 576 Ohm
Lead Channel Impedance Value: 705 Ohm
Lead Channel Setting Pacing Amplitude: 2 V
Lead Channel Setting Pacing Amplitude: 2.5 V
Lead Channel Setting Pacing Pulse Width: 1 ms
Lead Channel Setting Sensing Sensitivity: 2.5 mV
Pulse Gen Serial Number: 951809

## 2022-01-14 NOTE — Progress Notes (Signed)
Remote pacemaker transmission.   

## 2022-01-16 DIAGNOSIS — Z20822 Contact with and (suspected) exposure to covid-19: Secondary | ICD-10-CM | POA: Diagnosis not present

## 2022-01-18 DIAGNOSIS — Z20822 Contact with and (suspected) exposure to covid-19: Secondary | ICD-10-CM | POA: Diagnosis not present

## 2022-02-16 DIAGNOSIS — Z20822 Contact with and (suspected) exposure to covid-19: Secondary | ICD-10-CM | POA: Diagnosis not present

## 2022-02-19 DIAGNOSIS — Z20822 Contact with and (suspected) exposure to covid-19: Secondary | ICD-10-CM | POA: Diagnosis not present

## 2022-03-03 ENCOUNTER — Other Ambulatory Visit: Payer: Self-pay | Admitting: Internal Medicine

## 2022-03-16 DIAGNOSIS — Z20822 Contact with and (suspected) exposure to covid-19: Secondary | ICD-10-CM | POA: Diagnosis not present

## 2022-03-18 DIAGNOSIS — Z20822 Contact with and (suspected) exposure to covid-19: Secondary | ICD-10-CM | POA: Diagnosis not present

## 2022-03-24 ENCOUNTER — Ambulatory Visit (INDEPENDENT_AMBULATORY_CARE_PROVIDER_SITE_OTHER): Payer: Medicare Other | Admitting: Internal Medicine

## 2022-03-24 ENCOUNTER — Encounter: Payer: Self-pay | Admitting: Internal Medicine

## 2022-03-24 VITALS — BP 120/70 | HR 60 | Temp 98.6°F | Ht 69.0 in | Wt 206.0 lb

## 2022-03-24 DIAGNOSIS — I2510 Atherosclerotic heart disease of native coronary artery without angina pectoris: Secondary | ICD-10-CM

## 2022-03-24 DIAGNOSIS — E559 Vitamin D deficiency, unspecified: Secondary | ICD-10-CM | POA: Diagnosis not present

## 2022-03-24 DIAGNOSIS — I509 Heart failure, unspecified: Secondary | ICD-10-CM | POA: Insufficient documentation

## 2022-03-24 DIAGNOSIS — F32A Depression, unspecified: Secondary | ICD-10-CM

## 2022-03-24 DIAGNOSIS — I5023 Acute on chronic systolic (congestive) heart failure: Secondary | ICD-10-CM | POA: Diagnosis not present

## 2022-03-24 DIAGNOSIS — I5032 Chronic diastolic (congestive) heart failure: Secondary | ICD-10-CM | POA: Diagnosis not present

## 2022-03-24 DIAGNOSIS — I10 Essential (primary) hypertension: Secondary | ICD-10-CM

## 2022-03-24 MED ORDER — FUROSEMIDE 20 MG PO TABS: ORAL_TABLET | ORAL | 3 refills | Status: DC

## 2022-03-24 NOTE — Patient Instructions (Signed)
Ok to increase the lasix to 4 days per wk ? ?You will be contacted regarding the referral for: cardiology who works with CHF ? ?Please continue all other medications as before, and refills have been done if requested. ? ?Please have the pharmacy call with any other refills you may need. ? ?Please continue your efforts at being more active, low cholesterol diet, and weight control. ? ?Please keep your appointments with your specialists as you may have planned ? ?Please make an Appointment to return in 2-3 weeks for recheck ?

## 2022-03-24 NOTE — Progress Notes (Signed)
Patient ID: [name], male   DOB: [birth year], 81 y.o.   MRN: [id number] ? ? ? ?    Chief Complaint: follow up worsening feet swelling, depression,, htn, low vit d ? ?     HPI:  [name] is a 81 y.o. male here with c/o persistent foot and ankle swelling after cutting back on lasix; has been advised to take twice per wk and to notify if wt up 3-5 lbs but has been taking less than that in the past few months.  Pt denies chest pain, increased sob or doe, wheezing, orthopnea, PND, palpitations, dizziness or syncope.   Pt denies polydipsia, polyuria, or new focal neuro s/s.    Pt denies fever, wt loss, night sweats, loss of appetite, or other constitutional symptoms  Has had mild worsening depressive symptoms, but no suicidal ideation, or panic.  Does c/o ongoing fatigue, but denies signficant daytime hypersomnolence. ?      ?Wt Readings from Last 3 Encounters:  ?03/27/22 206 lb (93.4 kg)  ?03/24/22 206 lb (93.4 kg)  ?12/30/21 204 lb (92.5 kg)  ? ?BP Readings from Last 3 Encounters:  ?03/27/22 121/85  ?03/24/22 120/70  ?12/30/21 134/90  ? ?      ?Past Medical History:  ?Diagnosis Date  ? Abdominal pain, epigastric   ? Allergic rhinitis, cause unspecified   ? Arthritis   ? BPH (benign prostatic hyperplasia)   ? CHF (congestive heart failure) ([place])   ? Chronic anticoagulation   ? xarelto  ? Coronary artery calcification seen on CAT scan 12/26/2012  ? Depressive disorder, not elsewhere classified   ? Diverticulosis of colon (without mention of hemorrhage)   ? Esophageal reflux   ? High risk medication use   ? Hyperlipidemia   ? Hyperplastic rectal polyp   ? Hypertension   ? Lumbago   ? Pacemaker   ? 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 Persistent atrial fibrillation ([place])   ? Sick sinus syndrome ([place]) 05/16/2009  ? s/p PPM Corporate investment banker) by [name]  ? ?Past Surgical History:  ?Procedure Laterality Date  ? CYSTOSCOPY WITH RETROGRADE PYELOGRAM, URETEROSCOPY AND STENT PLACEMENT Left 12/30/2021  ? Procedure: CYSTOSCOPY WITH  RETROGRADE PYELOGRAM, URETEROSCOPY;  Surgeon: Remi Haggard, MD;  [facility];  Service: Urology;  Laterality: Left;  ? ESOPHAGOGASTRODUODENOSCOPY N/A 12/05/2013  ? Procedure: ESOPHAGOGASTRODUODENOSCOPY (EGD);  Surgeon: [name], MD;  Location: [facility];  Service: Endoscopy;  Laterality: N/A;  ? ESOPHAGOGASTRODUODENOSCOPY N/A 07/04/2015  ? Procedure: ESOPHAGOGASTRODUODENOSCOPY (EGD);  Surgeon: [name], MD;  Location: [facility];  Service: Endoscopy;  Laterality: N/A;  ? EXTRACORPOREAL SHOCK WAVE LITHOTRIPSY Left 10/13/2021  ? Procedure: EXTRACORPOREAL SHOCK WAVE LITHOTRIPSY (ESWL);  Surgeon: Lucas Mallow, MD;  [facility];  Service: Urology;  Laterality: Left;  90 MINS  ? INSERT / REPLACE / REMOVE PACEMAKER  05/2009  ?  [employer] ALTRUA 60  (serial number [id number]) dual-chamber pacemaker  ? LEAD REVISION/REPAIR N/A 10/15/2020  ? Procedure: LEAD REVISION/REPAIR;  Surgeon: Thompson Grayer, MD;  Location: [facility];  Service: Cardiovascular;  Laterality: N/A;  ? PACEMAKER PLACEMENT Bilateral   ? '05  ? PPM GENERATOR CHANGEOUT N/A 10/15/2020  ? Procedure: PPM GENERATOR CHANGEOUT;  Surgeon: Thompson Grayer, MD;  Location: [facility] [facility];  Service: Cardiovascular;  Laterality: N/A;  ? ? reports that he quit smoking about 30 years ago. His smoking use included cigarettes. He has a  10.00 pack-year smoking history. He has quit using smokeless tobacco.  His smokeless tobacco use included chew. He reports that he does not drink alcohol and does not use drugs. ?family history includes Arthritis in his paternal grandmother; Asthma in his sister; Cancer in his father; Diabetes in his mother; Hypertension in his brother, daughter, father, mother, sister, and son; Lung cancer in his father. ?No Known Allergies ?Current Outpatient Medications on File Prior to Visit  ?Medication Sig Dispense Refill  ? acetaminophen (TYLENOL) 650 MG CR tablet  Take 1,300 mg by mouth in the morning and at bedtime.    ? atorvastatin (LIPITOR) 80 MG tablet TAKE 1 TABLET BY MOUTH EVERY DAY 90 tablet 3  ? Cholecalciferol (VITAMIN D3) 50 MCG (2000 UT) TABS Take 2,000 Units by mouth daily.    ? clobetasol cream (TEMOVATE) 0.05 % APPLY TO AFFECTED AREA TWICE A DAY 30 g 2  ? diclofenac Sodium (VOLTAREN) 1 % GEL Apply topically 4 (four) times daily.    ? dorzolamide-timolol (COSOPT) 22.3-6.8 MG/ML ophthalmic solution Place 2 drops into both eyes in the morning and at bedtime.    ? fexofenadine (ALLEGRA) 180 MG tablet TAKE 1 TABLET (180 MG TOTAL) BY MOUTH DAILY. 90 tablet 1  ? flecainide (TAMBOCOR) 100 MG tablet TAKE 1 TABLET BY MOUTH TWICE A DAY 180 tablet 3  ? ketoconazole (NIZORAL) 2 % cream Apply 1 application topically daily. 15 g 0  ? ketoconazole (NIZORAL) 2 % shampoo as needed for irritation.    ? latanoprost (XALATAN) 0.005 % ophthalmic solution Place 1 drop into both eyes at bedtime.     ? meclizine (ANTIVERT) 12.5 MG tablet 1 TAB BY MOUTH EVERY 8 HRS AS NEEDED 30 tablet 5  ? omeprazole (PRILOSEC) 40 MG capsule TAKE 1 CAPSULE BY MOUTH EVERY DAY AS NEEDED 90 capsule 2  ? oxybutynin (DITROPAN-XL) 10 MG 24 hr tablet TAKE 1 TABLET (10 MG TOTAL) BY MOUTH DAILY. 90 tablet 1  ? psyllium (METAMUCIL) 0.52 g capsule Take 0.52 g by mouth in the morning and at bedtime.    ? rivaroxaban (XARELTO) 20 MG TABS tablet TAKE 1 TABLET BY MOUTH DAILY WITH SUPPER. 90 tablet 3  ? tamsulosin (FLOMAX) 0.4 MG CAPS capsule TAKE 1 CAPSULE BY MOUTH TWICE A DAY 180 capsule 2  ? traMADol (ULTRAM) 50 MG tablet Take 1 tablet (50 mg total) by mouth every 6 (six) hours as needed. for pain 120 tablet 2  ? ?No current facility-administered medications on file prior to visit.  ? ?     ROS:  All others reviewed and negative. ? ?Objective  ? ?     PE:  BP 120/70 (BP Location: Right Arm, Patient Position: Sitting, Cuff Size: Large)   Pulse 60   Temp 98.6 ?F (37 ?C) (Oral)   Ht '5\' 9"'$  (1.753 m)   Wt 206 lb  (93.4 kg)   SpO2 98%   BMI 30.42 kg/m?  ? ?              Constitutional: Pt appears in NAD ?              HENT: Head: NCAT.  ?              Right Ear: External ear normal.   ?              Left Ear: External ear normal.  ?              Eyes: .  Pupils are equal, round, and reactive to light. Conjunctivae and EOM are normal ?              Nose: without d/c or deformity ?              Neck: Neck supple. Gross normal ROM ?              Cardiovascular: Normal rate and regular rhythm.   ?              Pulmonary/Chest: Effort normal and breath sounds without rales or wheezing.  ?              Abd:  Soft, NT, ND, + BS, no organomegaly ?              Neurological: Pt is alert. At baseline orientation, motor grossly intact ?              Skin: Skin is warm. No rashes, no other new lesions, LE edema - 1+ bilateral to ankles ?              Psychiatric: Pt behavior is normal without agitation  ? ?Micro: none ? ?Cardiac tracings I have personally interpreted today:  none ? ?Pertinent Radiological findings (summarize): none  ? ?Lab Results  ?Component Value Date  ? WBC 6.6 11/24/2021  ? HGB 15.6 12/30/2021  ? HCT 46.0 12/30/2021  ? PLT 174.0 11/24/2021  ? GLUCOSE 107 (H) 12/30/2021  ? CHOL 155 11/24/2021  ? TRIG 62.0 11/24/2021  ? HDL 61.10 11/24/2021  ? LDLDIRECT 183.4 12/26/2013  ? [place] 81 11/24/2021  ? ALT 24 11/24/2021  ? AST 19 11/24/2021  ? NA 143 12/30/2021  ? K 3.9 12/30/2021  ? CL 105 12/30/2021  ? CREATININE 1.00 12/30/2021  ? BUN 17 12/30/2021  ? CO2 28 11/24/2021  ? TSH 1.43 11/24/2021  ? PSA 2.01 11/24/2021  ? INR 2.2 (H) 12/09/2020  ? HGBA1C 6.3 11/24/2021  ? ?Assessment/Plan:  ?KOLSEN CHOE is a 81 y.o. Black or African American [2] male with  has a past medical history of Abdominal pain, epigastric, Allergic rhinitis, cause unspecified, Arthritis, BPH (benign prostatic hyperplasia), CHF (congestive heart failure) ([place]), Chronic anticoagulation, Coronary artery calcification seen on CAT scan (12/26/2012),  Depressive disorder, not elsewhere classified, Diverticulosis of colon (without mention of hemorrhage), Esophageal reflux, High risk medication use, Hyperlipidemia, Hyperplastic rectal polyp, Hypertension, Lumbago, Pacem

## 2022-03-26 NOTE — Progress Notes (Signed)
?Cardiology Office Note:   ? ?Date:  03/27/2022  ? ?ID:  [name], DOB [birth year], MRN [id number] ? ?PCP:  Biagio Borg, MD  ? ?[facility] Providers ?Cardiologist:  Lenna Sciara, MD ?Referring MD: Biagio Borg, MD  ? ?Chief Complaint/Reason for Referral: Establish cardiovascular care ? ?ASSESSMENT:   ? ?1. Chronic diastolic CHF (congestive heart failure) ([place])   ?2. Paroxysmal atrial fibrillation ([place])   ?3. Sick sinus syndrome ([place])   ?4. Essential hypertension   ?5. Aortic atherosclerosis ([place])   ?6. Coronary artery calcification seen on CAT scan   ?7. Hyperlipidemia, unspecified hyperlipidemia type   ?8. CKD (chronic kidney disease) stage 2, GFR 60-89 ml/min   ?9. Other fatigue   ? ? ?PLAN:   ? ?In order of problems listed above: ?1.  Chronic diastolic heart failure: We will obtain echocardiogram to evaluate further.  Continue Toprol, increase Lasix to 20 mg twice daily, and will start Jardiance 10 mg daily.  Follow-up in 3 months or earlier if needed. ?2.  Paroxysmal atrial fibrillation: Continue Toprol and Xarelto. ?3.  Sick sinus syndrome: Follows with EP ?4.  Hypertension: Diastolic is elevated today.  Will increase Norvasc to 10 mg daily.  We will change Toprol, Norvasc, Micardis to p.m. dosing given fluctuating blood pressures during the day ?5.  Aortic atherosclerosis: Continue atorvastatin and Xarelto and lieu of aspirin.  Goal LDL is less than 70.  This is being managed by patient's primary care provider. ?6.  Coronary artery calcification: CT FFR was negative in 2022.  Continue aspirin and statin. ?7.  Hyperlipidemia: With aortic atherosclerosis and coronary artery calcification patient's goal LDL is less than 70. ?8.  Chronic kidney disease: GFR currently is 60 with a creatinine of 1.12 monitor for now. ?9.  Fatigue:  Will check CBC, reflex TSH, BNP, and CMP next week after augmentation and Lasix ? ?     ? ?   ? ?Dispo:  Return in about 3 months (around 06/27/2022).  ? ?  ? ?Medication  Adjustments/Labs and Tests Ordered: ?Current medicines are reviewed at length with the patient today.  Concerns regarding medicines are outlined above. ? ?The following changes have been made:    ? ?Labs/tests ordered: ?Orders Placed This Encounter  ?Procedures  ? CBC  ? TSH Rfx on Abnormal to Free T4  ? Comprehensive metabolic panel  ? ECHOCARDIOGRAM COMPLETE  ? ? ?Medication Changes: ?Meds ordered this encounter  ?Medications  ? furosemide (LASIX) 20 MG tablet  ?  Sig: Take 1 tablet (20 mg total) by mouth 2 (two) times daily.  ?  Dispense:  180 tablet  ?  Refill:  3  ?  Dose increase  ? amLODipine (NORVASC) 10 MG tablet  ?  Sig: Take 1 tablet (10 mg total) by mouth at bedtime.  ?  Dispense:  90 tablet  ?  Refill:  3  ?  Dose increase  ? metoprolol succinate (TOPROL XL) 25 MG 24 hr tablet  ?  Sig: Take 1 tablet (25 mg total) by mouth at bedtime.  ?  Dispense:  90 tablet  ?  Refill:  2  ?  Changed direction to bedtime.  ? telmisartan (MICARDIS) 20 MG tablet  ?  Sig: Take 1 tablet (20 mg total) by mouth at bedtime.  ?  Dispense:  90 tablet  ?  Refill:  2  ?  Changed direction to bedtime  ? ? ? ?Current medicines are reviewed at length with the  patient today.  The patient does not have concerns regarding medicines. ? ? ?History of Present Illness:   ? ?FOCUSED PROBLEM LIST:   ?1.  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  Paroxysmal atrial fibrillation on Xarelto ?3.  Coronary artery calcification with negative CT FFR 2022 ?4.  Aortic atherosclerosis on CT FFR 2022 ?5.  Hypertension ?6.  Hyperlipidemia ? ?The patient is a 81 y.o. male with the indicated medical history here for recommendations regarding heart failure.  Patient was seen by his primary care provider recently.  He seems to be doing well.  An echocardiogram done few years ago demonstrated a normal ejection fraction.  He is seen by the EP division in regards to his permanent pacemaker and  atrial fibrillation on anticoagulation. ? ?The patient notes increasing shortness of breath over the last several weeks.  He has noticed increasing peripheral edema and early satiety.  He feels quite winded with minimal exertion.  He denies any orthopnea or paroxysmal nocturnal dyspnea however.  He denies any presyncope, syncope, or exertional angina.  He has had no severe bleeding or bruising.  He has required no hospitalizations or emergency room visits. ? ? ?Current Medications: ?Current Meds  ?Medication Sig  ? acetaminophen (TYLENOL) 650 MG CR tablet Take 1,300 mg by mouth in the morning and at bedtime.  ? amLODipine (NORVASC) 10 MG tablet Take 1 tablet (10 mg total) by mouth at bedtime.  ? atorvastatin (LIPITOR) 80 MG tablet TAKE 1 TABLET BY MOUTH EVERY DAY  ? Cholecalciferol (VITAMIN D3) 50 MCG (2000 UT) TABS Take 2,000 Units by mouth daily.  ? clobetasol cream (TEMOVATE) 0.05 % APPLY TO AFFECTED AREA TWICE A DAY  ? diclofenac Sodium (VOLTAREN) 1 % GEL Apply topically 4 (four) times daily.  ? dorzolamide-timolol (COSOPT) 22.3-6.8 MG/ML ophthalmic solution Place 2 drops into both eyes in the morning and at bedtime.  ? fexofenadine (ALLEGRA) 180 MG tablet TAKE 1 TABLET (180 MG TOTAL) BY MOUTH DAILY.  ? flecainide (TAMBOCOR) 100 MG tablet TAKE 1 TABLET BY MOUTH TWICE A DAY  ? furosemide (LASIX) 20 MG tablet Take 1 tablet (20 mg total) by mouth 2 (two) times daily.  ? ketoconazole (NIZORAL) 2 % cream Apply 1 application topically daily.  ? ketoconazole (NIZORAL) 2 % shampoo as needed for irritation.  ? latanoprost (XALATAN) 0.005 % ophthalmic solution Place 1 drop into both eyes at bedtime.   ? meclizine (ANTIVERT) 12.5 MG tablet 1 TAB BY MOUTH EVERY 8 HRS AS NEEDED  ? omeprazole (PRILOSEC) 40 MG capsule TAKE 1 CAPSULE BY MOUTH EVERY DAY AS NEEDED  ? oxybutynin (DITROPAN-XL) 10 MG 24 hr tablet TAKE 1 TABLET (10 MG TOTAL) BY MOUTH DAILY.  ? psyllium (METAMUCIL) 0.52 g capsule Take 0.52 g by mouth in the morning  and at bedtime.  ? rivaroxaban (XARELTO) 20 MG TABS tablet TAKE 1 TABLET BY MOUTH DAILY WITH SUPPER.  ? tamsulosin (FLOMAX) 0.4 MG CAPS capsule TAKE 1 CAPSULE BY MOUTH TWICE A DAY  ? traMADol (ULTRAM) 50 MG tablet Take 1 tablet (50 mg total) by mouth every 6 (six) hours as needed. for pain  ? [DISCONTINUED] amLODipine (NORVASC) 5 MG tablet TAKE 1 TABLET BY MOUTH EVERY DAY  ? [DISCONTINUED] furosemide (LASIX) 20 MG tablet Take 1 tab by mouth 4 times per week (mon, wed, fri, Sunday)  ? [DISCONTINUED] metoprolol succinate (TOPROL XL) 25 MG 24 hr tablet Take 1 tablet (25 mg total)  by mouth daily.  ? [DISCONTINUED] telmisartan (MICARDIS) 20 MG tablet TAKE 1 TABLET BY MOUTH EVERY DAY  ?  ? ?Allergies:    ?Patient has no known allergies.  ? ?Social History:   ?Social History  ? ?Tobacco Use  ? Smoking status: Former  ?  Packs/day: 0.50  ?  Years: 20.00  ?  Pack years: 10.00  ?  Types: Cigarettes  ?  Quit date: 11/17/1991  ?  Years since quitting: 30.3  ? Smokeless tobacco: Former  ?  Types: Chew  ?Vaping Use  ? Vaping Use: Never used  ?Substance Use Topics  ? Alcohol use: No  ? Drug use: No  ?  ? ?Family Hx: ?Family History  ?Problem Relation Age of Onset  ? Diabetes Mother   ? Hypertension Mother   ? Hypertension Father   ? Lung cancer Father   ? Cancer Father   ? Asthma Sister   ? Hypertension Sister   ? Arthritis Paternal Grandmother   ? Hypertension Brother   ? Hypertension Daughter   ? Hypertension Son   ?  ? ?Review of Systems:   ?Please see the history of present illness.    ?All other systems reviewed and are negative. ?  ? ? ?EKGs/Labs/Other Test Reviewed:   ? ?EKG:  EKG performed November 2022 that I personally reviewed demonstrates atrial pacing with right bundle branch block ?Prior CV studies: ? ?CTA FFR 2022 with coronary artery calcification but normal FFR significant lesions ? ?ABIs 2023 within normal range ? ?TTE 2021 with ejection fraction of 50 to 55% with inferior basal hypokinesis.  No significant  valvular abnormalities ? ?Other studies Reviewed: ?Review of the additional studies/records demonstrates: Aortic atherosclerosis seen on CT of abdomen and pelvis 2022 ? ?Recent Labs: ?11/24/2021: ALT 24; Platelets 174.0; T

## 2022-03-27 ENCOUNTER — Encounter: Payer: Self-pay | Admitting: Internal Medicine

## 2022-03-27 ENCOUNTER — Ambulatory Visit (INDEPENDENT_AMBULATORY_CARE_PROVIDER_SITE_OTHER): Payer: Medicare Other | Admitting: Internal Medicine

## 2022-03-27 VITALS — BP 121/85 | HR 63 | Ht 69.0 in | Wt 206.0 lb

## 2022-03-27 DIAGNOSIS — I700 Atherosclerosis of aorta: Secondary | ICD-10-CM | POA: Diagnosis not present

## 2022-03-27 DIAGNOSIS — I495 Sick sinus syndrome: Secondary | ICD-10-CM | POA: Diagnosis not present

## 2022-03-27 DIAGNOSIS — R5383 Other fatigue: Secondary | ICD-10-CM | POA: Diagnosis not present

## 2022-03-27 DIAGNOSIS — I2510 Atherosclerotic heart disease of native coronary artery without angina pectoris: Secondary | ICD-10-CM | POA: Diagnosis not present

## 2022-03-27 DIAGNOSIS — I10 Essential (primary) hypertension: Secondary | ICD-10-CM | POA: Diagnosis not present

## 2022-03-27 DIAGNOSIS — E785 Hyperlipidemia, unspecified: Secondary | ICD-10-CM

## 2022-03-27 DIAGNOSIS — I480 Paroxysmal atrial fibrillation: Secondary | ICD-10-CM | POA: Diagnosis not present

## 2022-03-27 DIAGNOSIS — I5032 Chronic diastolic (congestive) heart failure: Secondary | ICD-10-CM | POA: Diagnosis not present

## 2022-03-27 DIAGNOSIS — N182 Chronic kidney disease, stage 2 (mild): Secondary | ICD-10-CM

## 2022-03-27 MED ORDER — TELMISARTAN 20 MG PO TABS: 20.0000 mg | ORAL_TABLET | Freq: Every day | ORAL | 2 refills | Status: DC

## 2022-03-27 MED ORDER — AMLODIPINE BESYLATE 10 MG PO TABS: 10.0000 mg | ORAL_TABLET | Freq: Every day | ORAL | 3 refills | Status: DC

## 2022-03-27 MED ORDER — FUROSEMIDE 20 MG PO TABS: 20.0000 mg | ORAL_TABLET | Freq: Two times a day (BID) | ORAL | 3 refills | Status: DC

## 2022-03-27 MED ORDER — METOPROLOL SUCCINATE ER 25 MG PO TB24: 25.0000 mg | ORAL_TABLET | Freq: Every day | ORAL | 2 refills | Status: DC

## 2022-03-27 NOTE — Patient Instructions (Addendum)
Medication Instructions:  ?Your physician has recommended you make the following change in your medication:  ?1.) change furosemide (Lasix) to one tablet twice a day ?2.) increase amlodipine (Norvasc) to 10 mg daily at bedtime ?3.) change telmisartan and metoprolol to bedtime ? ?*If you need a refill on your cardiac medications before your next appointment, please call your pharmacy* ? ? ?Lab Work: ?Return in [place] (cmet, cbc, tsh) ? ?If you have labs (blood work) drawn today and your tests are completely normal, you will receive your results only by: ?MyChart Message (if you have MyChart) OR ?A paper copy in the mail ?If you have any lab test that is abnormal or we need to change your treatment, we will call you to review the results. ? ? ?Testing/Procedures: ?Your physician has requested that you have an echocardiogram. Echocardiography is a painless test that uses sound waves to create images of your heart. It provides your doctor with information about the size and shape of your heart and how well your heart?s chambers and valves are working. This procedure takes approximately one hour. There are no restrictions for this procedure. ? ? ?Follow-Up: ?At [facility], you and your health needs are our priority.  As part of our continuing mission to provide you with exceptional heart care, we have created designated Provider Care Teams.  These Care Teams include your primary Cardiologist (physician) and Advanced Practice Providers (APPs -  Physician Assistants and Nurse Practitioners) who all work together to provide you with the care you need, when you need it. ? ?Your next appointment:   ?3 month(s) ? ?The format for your next appointment:   ?In Person ? ?Provider:   ?Early Osmond, MD   ? ? ?Important Information About Sugar ? ? ? ? ?  ?

## 2022-03-28 ENCOUNTER — Encounter: Payer: Self-pay | Admitting: Internal Medicine

## 2022-03-28 NOTE — Assessment & Plan Note (Signed)
BP Readings from Last 3 Encounters:  ?03/27/22 121/85  ?03/24/22 120/70  ?12/30/21 134/90  ? ?Stable, pt to continue medical treatment norvasc, toprol, micardis ? ?

## 2022-03-28 NOTE — Assessment & Plan Note (Signed)
Mild worsening, pt declines need for change in tx or referral for counsleing ?

## 2022-03-28 NOTE — Assessment & Plan Note (Signed)
Last vitamin D Lab Results  Component Value Date   VD25OH 34.45 11/24/2021   Low, to start oral replacement  

## 2022-03-28 NOTE — Assessment & Plan Note (Signed)
Pt for increased lasix 20 mg 4 days per wk, for bnp with labs, for echo ?

## 2022-04-03 ENCOUNTER — Other Ambulatory Visit: Payer: Medicare Other | Admitting: *Deleted

## 2022-04-03 DIAGNOSIS — I480 Paroxysmal atrial fibrillation: Secondary | ICD-10-CM | POA: Diagnosis not present

## 2022-04-03 DIAGNOSIS — I700 Atherosclerosis of aorta: Secondary | ICD-10-CM | POA: Diagnosis not present

## 2022-04-03 DIAGNOSIS — R5383 Other fatigue: Secondary | ICD-10-CM | POA: Diagnosis not present

## 2022-04-03 DIAGNOSIS — I5032 Chronic diastolic (congestive) heart failure: Secondary | ICD-10-CM | POA: Diagnosis not present

## 2022-04-03 DIAGNOSIS — I2510 Atherosclerotic heart disease of native coronary artery without angina pectoris: Secondary | ICD-10-CM | POA: Diagnosis not present

## 2022-04-03 DIAGNOSIS — I10 Essential (primary) hypertension: Secondary | ICD-10-CM

## 2022-04-03 LAB — COMPREHENSIVE METABOLIC PANEL
ALT: 25 IU/L (ref 0–44)
AST: 19 IU/L (ref 0–40)
Albumin/Globulin Ratio: 2.2 (ref 1.2–2.2)
Albumin: 4.4 g/dL (ref 3.7–4.7)
Alkaline Phosphatase: 88 IU/L (ref 44–121)
BUN/Creatinine Ratio: 16 (ref 10–24)
BUN: 18 mg/dL (ref 8–27)
Bilirubin Total: 0.6 mg/dL (ref 0.0–1.2)
CO2: 22 mmol/L (ref 20–29)
Calcium: 9.1 mg/dL (ref 8.6–10.2)
Chloride: 103 mmol/L (ref 96–106)
Creatinine, Ser: 1.12 mg/dL (ref 0.76–1.27)
Globulin, Total: 2 g/dL (ref 1.5–4.5)
Glucose: 132 mg/dL — ABNORMAL HIGH (ref 70–99)
Potassium: 3.5 mmol/L (ref 3.5–5.2)
Sodium: 141 mmol/L (ref 134–144)
Total Protein: 6.4 g/dL (ref 6.0–8.5)
eGFR: 67 mL/min/{1.73_m2} (ref >59)

## 2022-04-03 LAB — CBC
Hematocrit: 40.3 % (ref 37.5–51.0)
Hemoglobin: 13.9 g/dL (ref 13.0–17.7)
MCH: 30.8 pg (ref 26.6–33.0)
MCHC: 34.5 g/dL (ref 31.5–35.7)
MCV: 89 fL (ref 79–97)
Platelets: 176 10*3/uL (ref 150–450)
RBC: 4.51 x10E6/uL (ref 4.14–5.80)
RDW: 13.9 % (ref 11.6–15.4)
WBC: 7.1 10*3/uL (ref 3.4–10.8)

## 2022-04-03 LAB — TSH RFX ON ABNORMAL TO FREE T4: TSH: 1.12 u[IU]/mL (ref 0.450–4.500)

## 2022-04-06 DIAGNOSIS — H2513 Age-related nuclear cataract, bilateral: Secondary | ICD-10-CM | POA: Diagnosis not present

## 2022-04-06 DIAGNOSIS — H401131 Primary open-angle glaucoma, bilateral, mild stage: Secondary | ICD-10-CM | POA: Diagnosis not present

## 2022-04-08 ENCOUNTER — Other Ambulatory Visit: Payer: Self-pay | Admitting: Internal Medicine

## 2022-04-14 ENCOUNTER — Ambulatory Visit (INDEPENDENT_AMBULATORY_CARE_PROVIDER_SITE_OTHER): Payer: Medicare Other

## 2022-04-14 DIAGNOSIS — I495 Sick sinus syndrome: Secondary | ICD-10-CM | POA: Diagnosis not present

## 2022-04-14 LAB — CUP PACEART REMOTE DEVICE CHECK
Battery Remaining Longevity: 162 mo
Battery Remaining Percentage: 100 %
Brady Statistic RA Percent Paced: 99 %
Brady Statistic RV Percent Paced: 0 %
Date Time Interrogation Session: 20230530025200
Implantable Lead Implant Date: 20100719
Implantable Lead Implant Date: 20211130
Implantable Lead Location: 753859
Implantable Lead Location: 753860
Implantable Lead Model: 4136
Implantable Lead Model: 7842
Implantable Lead Serial Number: 1055910
Implantable Lead Serial Number: 28594836
Implantable Pulse Generator Implant Date: 20211130
Lead Channel Impedance Value: 628 Ohm
Lead Channel Impedance Value: 708 Ohm
Lead Channel Setting Pacing Amplitude: 2 V
Lead Channel Setting Pacing Amplitude: 2.5 V
Lead Channel Setting Pacing Pulse Width: 1 ms
Lead Channel Setting Sensing Sensitivity: 2.5 mV
Pulse Gen Serial Number: 951809

## 2022-04-15 ENCOUNTER — Ambulatory Visit (INDEPENDENT_AMBULATORY_CARE_PROVIDER_SITE_OTHER): Payer: Medicare Other | Admitting: Internal Medicine

## 2022-04-15 ENCOUNTER — Encounter: Payer: Self-pay | Admitting: Internal Medicine

## 2022-04-15 VITALS — BP 112/70 | HR 73 | Ht 69.0 in | Wt 202.0 lb

## 2022-04-15 DIAGNOSIS — E559 Vitamin D deficiency, unspecified: Secondary | ICD-10-CM

## 2022-04-15 DIAGNOSIS — J449 Chronic obstructive pulmonary disease, unspecified: Secondary | ICD-10-CM | POA: Diagnosis not present

## 2022-04-15 DIAGNOSIS — I2510 Atherosclerotic heart disease of native coronary artery without angina pectoris: Secondary | ICD-10-CM

## 2022-04-15 DIAGNOSIS — I10 Essential (primary) hypertension: Secondary | ICD-10-CM

## 2022-04-15 DIAGNOSIS — G4710 Hypersomnia, unspecified: Secondary | ICD-10-CM | POA: Diagnosis not present

## 2022-04-15 DIAGNOSIS — R42 Dizziness and giddiness: Secondary | ICD-10-CM

## 2022-04-15 NOTE — Progress Notes (Unsigned)
Patient ID: [name], male   DOB: 01/05/1941, 81 y.o.   MRN: [id number]        Chief Complaint: follow up persistent dizziness, hypersomnolence, copd       HPI:  [name] is a 81 y.o. male here to f/u having already f/u with cardiology.  BP improved with incresaed amldoipine per cards, and lasix twice per day, with f/u lab may 19 essentiall normal.   Has echo planned for June 5.  Dizziness persists, just wont go away, meclizine not really helping except at the start, just not now. Taking BP meds at bedtime not helping with this aspect.   Has not had recent MRI due to PPM placement, nor neurology; but CT head dec 2022 negative for acute. Does c/o ongoing fatigue, and also has signficant daytime hypersomnolence.  Pt denies chest pain, increased sob or doe, wheezing, orthopnea, PND, increased LE swelling, palpitations, syncope.   Pt denies polydipsia, polyuria, or new focal neuro s/s.     Wt Readings from Last 3 Encounters:  04/15/22 202 lb (91.6 kg)  03/27/22 206 lb (93.4 kg)  03/24/22 206 lb (93.4 kg)   BP Readings from Last 3 Encounters:  04/15/22 112/70  03/27/22 121/85  03/24/22 120/70         Past Medical History:  Diagnosis Date   Abdominal pain, epigastric    Allergic rhinitis, cause unspecified    Arthritis    BPH (benign prostatic hyperplasia)    CHF (congestive heart failure) (HCC)    Chronic anticoagulation    xarelto   Coronary artery calcification seen on CAT scan 12/26/2012   Depressive disorder, not elsewhere classified    Diverticulosis of colon (without mention of hemorrhage)    Esophageal reflux    High risk medication use    Hyperlipidemia    Hyperplastic rectal polyp    Hypertension    Lumbago    Pacemaker    Paralyzed hemidiaphragm    LEFT   Persistent atrial fibrillation (HCC)    Sick sinus syndrome ([place]) 05/16/2009   s/p PPM ([employer]) by [name]   Past Surgical History:  Procedure Laterality Date   CYSTOSCOPY WITH RETROGRADE PYELOGRAM,  URETEROSCOPY AND STENT PLACEMENT Left 12/30/2021   Procedure: CYSTOSCOPY WITH RETROGRADE PYELOGRAM, URETEROSCOPY;  Surgeon: Remi Haggard, MD;  Location: [facility];  Service: Urology;  Laterality: Left;   ESOPHAGOGASTRODUODENOSCOPY N/A 12/05/2013   Procedure: ESOPHAGOGASTRODUODENOSCOPY (EGD);  Surgeon: [name], MD;  Location: [facility];  Service: Endoscopy;  Laterality: N/A;   ESOPHAGOGASTRODUODENOSCOPY N/A 07/04/2015   Procedure: ESOPHAGOGASTRODUODENOSCOPY (EGD);  Surgeon: [name], MD;  Location: [facility];  Service: Endoscopy;  Laterality: N/A;   EXTRACORPOREAL SHOCK WAVE LITHOTRIPSY Left 10/13/2021   Procedure: EXTRACORPOREAL SHOCK WAVE LITHOTRIPSY (ESWL);  Surgeon: Lucas Mallow, MD;  Location: [facility];  Service: Urology;  Laterality: Left;  90 MINS   INSERT / REPLACE / REMOVE PACEMAKER  05/2009    [employer] [id number]  (serial number [id number]) dual-chamber pacemaker   LEAD REVISION/REPAIR N/A 10/15/2020   Procedure: LEAD REVISION/REPAIR;  Surgeon: Thompson Grayer, MD;  Location: [facility];  Service: Cardiovascular;  Laterality: N/A;   PACEMAKER PLACEMENT Bilateral    '05   PPM GENERATOR CHANGEOUT N/A 10/15/2020   Procedure: PPM GENERATOR CHANGEOUT;  Surgeon: Thompson Grayer, MD;  Location: [facility];  Service: Cardiovascular;  Laterality: N/A;    reports that he quit smoking about 30 years  ago. His smoking use included cigarettes. He has a 10.00 pack-year smoking history. He has quit using smokeless tobacco.  His smokeless tobacco use included chew. He reports that he does not drink alcohol and does not use drugs. family history includes Arthritis in his paternal grandmother; Asthma in his sister; Cancer in his father; Diabetes in his mother; Hypertension in his brother, daughter, father, mother, sister, and son; Lung cancer in his father. No Known Allergies Current Outpatient Medications on File Prior to Visit   Medication Sig Dispense Refill   acetaminophen (TYLENOL) 650 MG CR tablet Take 1,300 mg by mouth in the morning and at bedtime.     amLODipine (NORVASC) 10 MG tablet Take 1 tablet (10 mg total) by mouth at bedtime. 90 tablet 3   atorvastatin (LIPITOR) 80 MG tablet TAKE 1 TABLET BY MOUTH EVERY DAY 90 tablet 3   Cholecalciferol (VITAMIN D3) 50 MCG (2000 UT) TABS Take 2,000 Units by mouth daily.     clobetasol cream (TEMOVATE) 0.05 % APPLY TO AFFECTED AREA TWICE A DAY 30 g 2   diclofenac Sodium (VOLTAREN) 1 % GEL Apply topically 4 (four) times daily.     dorzolamide-timolol (COSOPT) 22.3-6.8 MG/ML ophthalmic solution Place 2 drops into both eyes in the morning and at bedtime.     fexofenadine (ALLEGRA) 180 MG tablet TAKE 1 TABLET (180 MG TOTAL) BY MOUTH DAILY. 90 tablet 1   flecainide (TAMBOCOR) 100 MG tablet TAKE 1 TABLET BY MOUTH TWICE A DAY 180 tablet 3   furosemide (LASIX) 20 MG tablet Take 1 tablet (20 mg total) by mouth 2 (two) times daily. 180 tablet 3   ketoconazole (NIZORAL) 2 % cream Apply 1 application topically daily. 15 g 0   ketoconazole (NIZORAL) 2 % shampoo as needed for irritation.     latanoprost (XALATAN) 0.005 % ophthalmic solution Place 1 drop into both eyes at bedtime.      meclizine (ANTIVERT) 12.5 MG tablet 1 TAB BY MOUTH EVERY 8 HRS AS NEEDED 30 tablet 5   metoprolol succinate (TOPROL XL) 25 MG 24 hr tablet Take 1 tablet (25 mg total) by mouth at bedtime. 90 tablet 2   omeprazole (PRILOSEC) 40 MG capsule TAKE 1 CAPSULE BY MOUTH EVERY DAY AS NEEDED 90 capsule 2   oxybutynin (DITROPAN-XL) 10 MG 24 hr tablet TAKE 1 TABLET (10 MG TOTAL) BY MOUTH DAILY. 90 tablet 1   psyllium (METAMUCIL) 0.52 g capsule Take 0.52 g by mouth in the morning and at bedtime.     rivaroxaban (XARELTO) 20 MG TABS tablet TAKE 1 TABLET BY MOUTH DAILY WITH SUPPER. 90 tablet 3   tamsulosin (FLOMAX) 0.4 MG CAPS capsule TAKE 1 CAPSULE BY MOUTH TWICE A DAY 180 capsule 2   telmisartan (MICARDIS) 20 MG tablet  Take 1 tablet (20 mg total) by mouth at bedtime. 90 tablet 2   traMADol (ULTRAM) 50 MG tablet TAKE 1 TABLET BY MOUTH EVERY 6 HOURS AS NEEDED. FOR PAIN 120 tablet 2   No current facility-administered medications on file prior to visit.        ROS:  All others reviewed and negative.  Objective        PE:  BP 112/70 (BP Location: Right Arm, Patient Position: Sitting, Cuff Size: Large)   Pulse 73   Ht '5\' 9"'$  (1.753 m)   Wt 202 lb (91.6 kg)   SpO2 97%   BMI 29.83 kg/m  Constitutional: Pt appears in NAD               HENT: Head: NCAT.                Right Ear: External ear normal.                 Left Ear: External ear normal.                Eyes: . Pupils are equal, round, and reactive to light. Conjunctivae and EOM are normal               Nose: without d/c or deformity               Neck: Neck supple. Gross normal ROM               Cardiovascular: Normal rate and regular rhythm.                 Pulmonary/Chest: Effort normal and breath sounds without rales or wheezing.                Abd:  Soft, NT, ND, + BS, no organomegaly               Neurological: Pt is alert. At baseline orientation, motor grossly intact               Skin: Skin is warm. No rashes, no other new lesions, LE edema - trace LLE only               Psychiatric: Pt behavior is normal without agitation   Micro: none  Cardiac tracings I have personally interpreted today:  none  Pertinent Radiological findings (summarize): none   Lab Results  Component Value Date   WBC 7.1 04/03/2022   HGB 13.9 04/03/2022   HCT 40.3 04/03/2022   PLT 176 04/03/2022   GLUCOSE 132 (H) 04/03/2022   CHOL 155 11/24/2021   TRIG 62.0 11/24/2021   HDL 61.10 11/24/2021   LDLDIRECT 183.4 12/26/2013   LDLCALC 81 11/24/2021   ALT 25 04/03/2022   AST 19 04/03/2022   NA 141 04/03/2022   K 3.5 04/03/2022   CL 103 04/03/2022   CREATININE 1.12 04/03/2022   BUN 18 04/03/2022   CO2 22 04/03/2022   TSH 1.120 04/03/2022    PSA 2.01 11/24/2021   INR 2.2 (H) 12/09/2020   HGBA1C 6.3 11/24/2021   Assessment/Plan:  [name] is a 81 y.o. Black or African American [2] male with  has a past medical history of Abdominal pain, epigastric, Allergic rhinitis, cause unspecified, Arthritis, BPH (benign prostatic hyperplasia), CHF (congestive heart failure) ([place]), Chronic anticoagulation, Coronary artery calcification seen on CAT scan (12/26/2012), Depressive disorder, not elsewhere classified, Diverticulosis of colon (without mention of hemorrhage), Esophageal reflux, High risk medication use, Hyperlipidemia, Hyperplastic rectal polyp, Hypertension, Lumbago, Pacemaker, Paralyzed hemidiaphragm, Persistent atrial fibrillation ([place]), and Sick sinus syndrome ([place]) (05/16/2009).  Hypersomnolence ? osa - for refer pulmonary for possible testing  COPD GOLD III with marked reversibility  Currently stable, cont inhaler prn  Dizziness Etiology unclear, unable for mri due to PPM, for neurology referral  Essential hypertension BP Readings from Last 3 Encounters:  04/15/22 112/70  03/27/22 121/85  03/24/22 120/70   Stable, pt to continue medical treatment norvasc 10, micardis 20, toprl xl 25, lasix 20 qd   Vitamin D deficiency Last vitamin D Lab Results  Component Value Date   VD25OH 34.45 11/24/2021  Low, reminded to start oral replacement  Followup: Return in about 4 months (around 08/15/2022).  Cathlean Cower, MD 04/16/2022 10:08 PM [facility]

## 2022-04-15 NOTE — Patient Instructions (Addendum)
Please continue all other medications as before, and refills have been done if requested.  Please have the pharmacy call with any other refills you may need.  Please continue your efforts at being more active, low cholesterol diet, and weight control.  Please keep your appointments with your specialists as you may have planned - cardiology at 3 months  You will be contacted regarding the referral for: Neurology, and Pulmonary  No further labs work needed today  Please make an Appointment to return in 4 months, or sooner if needed

## 2022-04-16 ENCOUNTER — Encounter: Payer: Self-pay | Admitting: Internal Medicine

## 2022-04-16 ENCOUNTER — Encounter: Payer: Self-pay | Admitting: Neurology

## 2022-04-16 NOTE — Assessment & Plan Note (Signed)
?   osa - for refer pulmonary for possible testing

## 2022-04-16 NOTE — Assessment & Plan Note (Signed)
Last vitamin D Lab Results  Component Value Date   VD25OH 34.45 11/24/2021   Low, reminded to start oral replacement  

## 2022-04-16 NOTE — Assessment & Plan Note (Signed)
Etiology unclear, unable for mri due to PPM, for neurology referral

## 2022-04-16 NOTE — Assessment & Plan Note (Signed)
Currently stable, cont inhaler prn

## 2022-04-16 NOTE — Assessment & Plan Note (Signed)
BP Readings from Last 3 Encounters:  04/15/22 112/70  03/27/22 121/85  03/24/22 120/70   Stable, pt to continue medical treatment norvasc 10, micardis 20, toprl xl 25, lasix 20 qd

## 2022-04-20 ENCOUNTER — Ambulatory Visit (HOSPITAL_COMMUNITY): Payer: Medicare Other | Attending: Cardiovascular Disease

## 2022-04-20 DIAGNOSIS — I5032 Chronic diastolic (congestive) heart failure: Secondary | ICD-10-CM | POA: Diagnosis not present

## 2022-04-20 DIAGNOSIS — R5383 Other fatigue: Secondary | ICD-10-CM | POA: Diagnosis not present

## 2022-04-20 DIAGNOSIS — I480 Paroxysmal atrial fibrillation: Secondary | ICD-10-CM | POA: Insufficient documentation

## 2022-04-20 DIAGNOSIS — I2510 Atherosclerotic heart disease of native coronary artery without angina pectoris: Secondary | ICD-10-CM | POA: Insufficient documentation

## 2022-04-20 DIAGNOSIS — I10 Essential (primary) hypertension: Secondary | ICD-10-CM | POA: Insufficient documentation

## 2022-04-20 DIAGNOSIS — I700 Atherosclerosis of aorta: Secondary | ICD-10-CM | POA: Diagnosis not present

## 2022-04-20 LAB — ECHOCARDIOGRAM COMPLETE
Area-P 1/2: 2.54 cm2
S' Lateral: 1.9 cm

## 2022-04-20 MED ORDER — PERFLUTREN LIPID MICROSPHERE
1.0000 mL | INTRAVENOUS | Status: AC | PRN
  Administered 2022-04-20: 2 mL via INTRAVENOUS

## 2022-04-29 NOTE — Progress Notes (Signed)
Remote pacemaker transmission.   

## 2022-05-06 ENCOUNTER — Encounter: Payer: Self-pay | Admitting: Pulmonary Disease

## 2022-05-06 ENCOUNTER — Ambulatory Visit (INDEPENDENT_AMBULATORY_CARE_PROVIDER_SITE_OTHER): Payer: Medicare Other | Admitting: Pulmonary Disease

## 2022-05-06 VITALS — BP 118/72 | HR 60 | Temp 98.2°F | Ht 69.0 in | Wt 206.6 lb

## 2022-05-06 DIAGNOSIS — I509 Heart failure, unspecified: Secondary | ICD-10-CM

## 2022-05-06 DIAGNOSIS — R0609 Other forms of dyspnea: Secondary | ICD-10-CM

## 2022-05-06 DIAGNOSIS — R0683 Snoring: Secondary | ICD-10-CM

## 2022-05-06 MED ORDER — ALBUTEROL SULFATE HFA 108 (90 BASE) MCG/ACT IN AERS: 2.0000 | INHALATION_SPRAY | Freq: Four times a day (QID) | RESPIRATORY_TRACT | 6 refills | Status: DC | PRN

## 2022-05-06 MED ORDER — TRELEGY ELLIPTA 100-62.5-25 MCG/ACT IN AEPB: 1.0000 | INHALATION_SPRAY | Freq: Every day | RESPIRATORY_TRACT | 0 refills | Status: DC

## 2022-05-06 MED ORDER — HYDROCHLOROTHIAZIDE 25 MG PO TABS: 25.0000 mg | ORAL_TABLET | Freq: Every day | ORAL | 2 refills | Status: DC

## 2022-05-06 MED ORDER — TRELEGY ELLIPTA 100-62.5-25 MCG/ACT IN AEPB: 1.0000 | INHALATION_SPRAY | Freq: Every day | RESPIRATORY_TRACT | 2 refills | Status: DC

## 2022-05-06 NOTE — Progress Notes (Signed)
[name]    [id number]    06-08-1941  Primary Care Physician:John, Hunt Oris, MD  Referring Physician: Biagio Borg, MD [address] [place],  [place] [address]  Chief complaint:   Patient being seen for excessive fatigue  HPI:  Excessive fatigue, daytime tiredness, daytime sleepiness  Has been having issues with his energy level for a while now Exercise tolerance is also decreased significantly  He does have a history of having a pacemaker, elevated left hemidiaphragm Lower extremity edema Shortness of breath on exertion  Stated he finds it difficult to walk 100 yards  He does have some back pain with exertion  Usually goes to bed between 9 and 11 PM, falls asleep watching TV Wakes up after about 2 hours Out of bed between 6 and 7 AM sometimes he will stay in bed till 8 AM  He naps on a daily basis  Not as active as he used to be secondary to just feeling tired almost always  Admits to snoring, is not aware that he has been told about witnessed apneas  He does have a history of obstructive lung disease with significant reversibility on his most recent PFT from 2021  Outpatient Encounter Medications as of 05/06/2022  Medication Sig   acetaminophen (TYLENOL) 650 MG CR tablet Take 1,300 mg by mouth in the morning and at bedtime.   amLODipine (NORVASC) 10 MG tablet Take 1 tablet (10 mg total) by mouth at bedtime.   atorvastatin (LIPITOR) 80 MG tablet TAKE 1 TABLET BY MOUTH EVERY DAY   Cholecalciferol (VITAMIN D3) 50 MCG (2000 UT) TABS Take 2,000 Units by mouth daily.   clobetasol cream (TEMOVATE) 0.05 % APPLY TO AFFECTED AREA TWICE A DAY   diclofenac Sodium (VOLTAREN) 1 % GEL Apply topically 4 (four) times daily.   dorzolamide-timolol (COSOPT) 22.3-6.8 MG/ML ophthalmic solution Place 2 drops into both eyes in the morning and at bedtime.   fexofenadine (ALLEGRA) 180 MG tablet TAKE 1 TABLET (180 MG TOTAL) BY MOUTH DAILY.   flecainide (TAMBOCOR) 100 MG tablet  TAKE 1 TABLET BY MOUTH TWICE A DAY   furosemide (LASIX) 20 MG tablet Take 1 tablet (20 mg total) by mouth 2 (two) times daily.   ketoconazole (NIZORAL) 2 % cream Apply 1 application topically daily.   ketoconazole (NIZORAL) 2 % shampoo as needed for irritation.   latanoprost (XALATAN) 0.005 % ophthalmic solution Place 1 drop into both eyes at bedtime.    meclizine (ANTIVERT) 12.5 MG tablet 1 TAB BY MOUTH EVERY 8 HRS AS NEEDED   metoprolol succinate (TOPROL XL) 25 MG 24 hr tablet Take 1 tablet (25 mg total) by mouth at bedtime.   omeprazole (PRILOSEC) 40 MG capsule TAKE 1 CAPSULE BY MOUTH EVERY DAY AS NEEDED   oxybutynin (DITROPAN-XL) 10 MG 24 hr tablet TAKE 1 TABLET (10 MG TOTAL) BY MOUTH DAILY.   psyllium (METAMUCIL) 0.52 g capsule Take 0.52 g by mouth in the morning and at bedtime.   rivaroxaban (XARELTO) 20 MG TABS tablet TAKE 1 TABLET BY MOUTH DAILY WITH SUPPER.   tamsulosin (FLOMAX) 0.4 MG CAPS capsule TAKE 1 CAPSULE BY MOUTH TWICE A DAY   telmisartan (MICARDIS) 20 MG tablet Take 1 tablet (20 mg total) by mouth at bedtime.   traMADol (ULTRAM) 50 MG tablet TAKE 1 TABLET BY MOUTH EVERY 6 HOURS AS NEEDED. FOR PAIN   No facility-administered encounter medications on file as of 05/06/2022.  Allergies as of 05/06/2022   (No Known Allergies)    Past Medical History:  Diagnosis Date   Abdominal pain, epigastric    Allergic rhinitis, cause unspecified    Arthritis    BPH (benign prostatic hyperplasia)    CHF (congestive heart failure) (HCC)    Chronic anticoagulation    xarelto   Coronary artery calcification seen on CAT scan 12/26/2012   Depressive disorder, not elsewhere classified    Diverticulosis of colon (without mention of hemorrhage)    Esophageal reflux    High risk medication use    Hyperlipidemia    Hyperplastic rectal polyp    Hypertension    Lumbago    Pacemaker    Paralyzed hemidiaphragm    LEFT   Persistent atrial fibrillation (HCC)    Sick sinus syndrome ([place])  05/16/2009   s/p PPM ([employer]) by [name]    Past Surgical History:  Procedure Laterality Date   CYSTOSCOPY WITH RETROGRADE PYELOGRAM, URETEROSCOPY AND STENT PLACEMENT Left 12/30/2021   Procedure: CYSTOSCOPY WITH RETROGRADE PYELOGRAM, URETEROSCOPY;  Surgeon: Remi Haggard, MD;  Location: [facility];  Service: Urology;  Laterality: Left;   ESOPHAGOGASTRODUODENOSCOPY N/A 12/05/2013   Procedure: ESOPHAGOGASTRODUODENOSCOPY (EGD);  Surgeon: [name], MD;  Location: [facility];  Service: Endoscopy;  Laterality: N/A;   ESOPHAGOGASTRODUODENOSCOPY N/A 07/04/2015   Procedure: ESOPHAGOGASTRODUODENOSCOPY (EGD);  Surgeon: [name], MD;  Location: [facility];  Service: Endoscopy;  Laterality: N/A;   EXTRACORPOREAL SHOCK WAVE LITHOTRIPSY Left 10/13/2021   Procedure: EXTRACORPOREAL SHOCK WAVE LITHOTRIPSY (ESWL);  Surgeon: Lucas Mallow, MD;  Location: [facility];  Service: Urology;  Laterality: Left;  90 MINS   INSERT / REPLACE / REMOVE PACEMAKER  05/2009    [employer] [id number]  (serial number [id number]) dual-chamber pacemaker   LEAD REVISION/REPAIR N/A 10/15/2020   Procedure: LEAD REVISION/REPAIR;  Surgeon: Thompson Grayer, MD;  Location: [facility];  Service: Cardiovascular;  Laterality: N/A;   PACEMAKER PLACEMENT Bilateral    '05   PPM GENERATOR CHANGEOUT N/A 10/15/2020   Procedure: PPM GENERATOR CHANGEOUT;  Surgeon: Thompson Grayer, MD;  Location: [facility];  Service: Cardiovascular;  Laterality: N/A;    Family History  Problem Relation Age of Onset   Diabetes Mother    Hypertension Mother    Hypertension Father    Lung cancer Father    Cancer Father    Asthma Sister    Hypertension Sister    Arthritis Paternal Grandmother    Hypertension Brother    Hypertension Daughter    Hypertension Son     Social History   Socioeconomic History   Marital status: Married    Spouse name: Not on file   Number of children: 4    Years of education: Not on file   Highest education level: Not on file  Occupational History   Occupation: retired    Fish farm manager: RETIRED  Tobacco Use   Smoking status: Former    Packs/day: 0.50    Years: 20.00    Total pack years: 10.00    Types: Cigarettes    Quit date: 11/17/1991    Years since quitting: 30.4   Smokeless tobacco: Former    Types: Nurse, children's Use: Never used  Substance and Sexual Activity   Alcohol use: No   Drug use: No   Sexual activity: Never  Other Topics Concern   Not on file  Social History Narrative  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    Social Determinants of Health   Financial Resource Strain: Low Risk  (05/20/2021)   Overall Financial Resource Strain (CARDIA)    Difficulty of Paying Living Expenses: Not hard at all  Food Insecurity: No Food Insecurity (05/20/2021)   Hunger Vital Sign    Worried About Running Out of Food in the Last Year: Never true    Ran Out of Food in the Last Year: Never true  Transportation Needs: No Transportation Needs (05/20/2021)   PRAPARE - Hydrologist (Medical): No    Lack of Transportation (Non-Medical): No  Physical Activity: Sufficiently Active (05/20/2021)   Exercise Vital Sign    Days of Exercise per Week: 5 days    Minutes of Exercise per Session: 30 min  Stress: No Stress Concern Present (05/20/2021)   [place]    Feeling of Stress : Not at all  Social Connections: Socially Isolated (05/20/2021)   Social Connection and Isolation Panel [NHANES]    Frequency of Communication with Friends and Family: More than three times a week    Frequency of Social Gatherings with Friends and Family: Once a week    Attends Religious Services: Never    Marine scientist or Organizations: No    Attends Archivist Meetings: Never    Marital Status: Never married  Intimate Partner Violence: Not At Risk (05/20/2021)    Humiliation, Afraid, Rape, and Kick questionnaire    Fear of Current or Ex-Partner: No    Emotionally Abused: No    Physically Abused: No    Sexually Abused: No    Review of Systems  Respiratory:  Positive for shortness of breath.   Psychiatric/Behavioral:  Positive for sleep disturbance.     Vitals:   05/06/22 1609  BP: 118/72  Pulse: 60  Temp: 98.2 F (36.8 C)  SpO2: 98%     Physical Exam Constitutional:      Appearance: He is obese.  HENT:     Head: Normocephalic.     Mouth/Throat:     Mouth: Mucous membranes are moist.  Cardiovascular:     Rate and Rhythm: Normal rate and regular rhythm.     Heart sounds: No murmur heard.    No friction rub.  Pulmonary:     Effort: No respiratory distress.     Breath sounds: No stridor. No wheezing or rhonchi.  Musculoskeletal:     Cervical back: No rigidity or tenderness.  Neurological:     Mental Status: He is alert.  Psychiatric:        Mood and Affect: Mood normal.       05/06/2022    4:00 PM  Results of the Epworth flowsheet  Sitting and reading 3  Watching TV 3  Sitting, inactive in a public place (e.g. a theatre or a meeting) 0  As a passenger in a car for an hour without a break 0  Lying down to rest in the afternoon when circumstances permit 1  Sitting and talking to someone 0  Sitting quietly after a lunch without alcohol 3  In a car, while stopped for a few minutes in traffic 0  Total score 10    Data Reviewed: Chest x-ray from 2021 and previously reviewed with patient showing elevated left hemidiaphragm  Most recent PFT from 2021 did reveal severe obstructive disease with significant bronchodilator response, significant decrease in FEF 25-75  also did show significant bronchodilator response  Assessment:  Severe obstructive lung disease  Shortness of breath on exertion  Fatigue  Excessive daytime sleepiness  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  Plan/Recommendations: Prescription for  Trelegy 100  Prescription for albuterol  Prescription for Diuril  Obtain BMP in about a week to 2 weeks to assess renal functions  Schedule patient for an in lab sleep study-split-night study to ascertain presence of sleep disordered breathing  Schedule patient for repeat pulmonary function test  Chest x-ray may be performed at next visit  Graded exercises as tolerated  Encouraged the patient to procure an inspiratory muscle trainer which may help  Regular walks with documentation encouraged  I will see him back in about 4 to 6 weeks  Encouraged to call with any significant concerns   Sherrilyn Rist MD [facility] 05/06/2022, 4:37 PM  CC: Biagio Borg, MD

## 2022-05-06 NOTE — Patient Instructions (Addendum)
In lab sleep study-split-night study   Prescription for albuterol  Prescription for Trelegy Trelegy 100  Diuril 25 mg daily  Pulmonary function tests-May be done at next visit  Obtain chest x-ray may be done at next visit  Walk on a daily basis as best as he can tolerate -i'd like to know how much walking you are doing at the next visit  Inspiratory muscle trainers -Like the one we checked out on [place] may do more research on it before you buy 1 -they just exercise the muscles of breathing   Tentative follow-up in 4 to 6 weeks  BMP in 1 to 2 weeks

## 2022-05-07 ENCOUNTER — Other Ambulatory Visit: Payer: Self-pay | Admitting: Internal Medicine

## 2022-05-07 DIAGNOSIS — I5032 Chronic diastolic (congestive) heart failure: Secondary | ICD-10-CM

## 2022-05-07 NOTE — Telephone Encounter (Signed)
Please refill as per office routine med refill policy (all routine meds to be refilled for 3 mo or monthly (per pt preference) up to one year from last visit, then month to month grace period for 3 mo, then further med refills will have to be denied) ? ?

## 2022-05-13 ENCOUNTER — Ambulatory Visit (INDEPENDENT_AMBULATORY_CARE_PROVIDER_SITE_OTHER): Payer: Medicare Other | Admitting: Internal Medicine

## 2022-05-13 ENCOUNTER — Ambulatory Visit (INDEPENDENT_AMBULATORY_CARE_PROVIDER_SITE_OTHER): Payer: Medicare Other

## 2022-05-13 ENCOUNTER — Encounter: Payer: Self-pay | Admitting: Internal Medicine

## 2022-05-13 VITALS — BP 112/60 | HR 68 | Temp 98.2°F | Ht 69.0 in | Wt 198.0 lb

## 2022-05-13 DIAGNOSIS — I2510 Atherosclerotic heart disease of native coronary artery without angina pectoris: Secondary | ICD-10-CM | POA: Diagnosis not present

## 2022-05-13 DIAGNOSIS — R0602 Shortness of breath: Secondary | ICD-10-CM

## 2022-05-13 DIAGNOSIS — R739 Hyperglycemia, unspecified: Secondary | ICD-10-CM

## 2022-05-13 DIAGNOSIS — R0600 Dyspnea, unspecified: Secondary | ICD-10-CM | POA: Diagnosis not present

## 2022-05-13 DIAGNOSIS — J90 Pleural effusion, not elsewhere classified: Secondary | ICD-10-CM | POA: Diagnosis not present

## 2022-05-13 DIAGNOSIS — J449 Chronic obstructive pulmonary disease, unspecified: Secondary | ICD-10-CM

## 2022-05-13 DIAGNOSIS — J189 Pneumonia, unspecified organism: Secondary | ICD-10-CM | POA: Diagnosis not present

## 2022-05-13 DIAGNOSIS — E559 Vitamin D deficiency, unspecified: Secondary | ICD-10-CM | POA: Diagnosis not present

## 2022-05-13 LAB — HEPATIC FUNCTION PANEL
ALT: 23 U/L (ref 0–53)
AST: 21 U/L (ref 0–37)
Albumin: 4.7 g/dL (ref 3.5–5.2)
Alkaline Phosphatase: 84 U/L (ref 39–117)
Bilirubin, Direct: 0.2 mg/dL (ref 0.0–0.3)
Total Bilirubin: 1 mg/dL (ref 0.2–1.2)
Total Protein: 8 g/dL (ref 6.0–8.3)

## 2022-05-13 LAB — CBC WITH DIFFERENTIAL/PLATELET
Basophils Absolute: 0 10*3/uL (ref 0.0–0.1)
Basophils Relative: 0.4 % (ref 0.0–3.0)
Eosinophils Absolute: 0 10*3/uL (ref 0.0–0.7)
Eosinophils Relative: 0.4 % (ref 0.0–5.0)
HCT: 46.5 % (ref 39.0–52.0)
Hemoglobin: 15.4 g/dL (ref 13.0–17.0)
Lymphocytes Relative: 22.6 % (ref 12.0–46.0)
Lymphs Abs: 1.9 10*3/uL (ref 0.7–4.0)
MCHC: 33.1 g/dL (ref 30.0–36.0)
MCV: 92 fl (ref 78.0–100.0)
Monocytes Absolute: 1 10*3/uL (ref 0.1–1.0)
Monocytes Relative: 12.3 % — ABNORMAL HIGH (ref 3.0–12.0)
Neutro Abs: 5.4 10*3/uL (ref 1.4–7.7)
Neutrophils Relative %: 64.3 % (ref 43.0–77.0)
Platelets: 184 10*3/uL (ref 150.0–400.0)
RBC: 5.05 Mil/uL (ref 4.22–5.81)
RDW: 14.3 % (ref 11.5–15.5)
WBC: 8.4 10*3/uL (ref 4.0–10.5)

## 2022-05-13 LAB — BASIC METABOLIC PANEL
BUN: 22 mg/dL (ref 6–23)
CO2: 30 mEq/L (ref 19–32)
Calcium: 10.3 mg/dL (ref 8.4–10.5)
Chloride: 94 mEq/L — ABNORMAL LOW (ref 96–112)
Creatinine, Ser: 1.51 mg/dL — ABNORMAL HIGH (ref 0.40–1.50)
GFR: 43.79 mL/min — ABNORMAL LOW (ref >60.00)
Glucose, Bld: 122 mg/dL — ABNORMAL HIGH (ref 70–99)
Potassium: 3.5 mEq/L (ref 3.5–5.1)
Sodium: 136 mEq/L (ref 135–145)

## 2022-05-13 LAB — D-DIMER, QUANTITATIVE: D-Dimer, Quant: <0.19 mcg/mL FEU (ref <0.50)

## 2022-05-13 LAB — BRAIN NATRIURETIC PEPTIDE: Pro B Natriuretic peptide (BNP): 19 pg/mL (ref 0.0–100.0)

## 2022-05-13 NOTE — Patient Instructions (Addendum)
Ok to try the breztri sample, and let us know if you need the prescription for this instead of the trelegy  Please continue all other medications as before, and refills have been done if requested.  Please have the pharmacy call with any other refills you may need.  Please keep your appointments with your specialists as you may have planned  Please go to the XRAY Department in the first floor for the x-ray testing  Please go to the LAB at the blood drawing area for the tests to be done  You will be contacted by phone if any changes need to be made immediately.  Otherwise, you will receive a letter about your results with an explanation, but please check with MyChart first.  Please remember to sign up for MyChart if you have not done so, as this will be important to you in the future with finding out test results, communicating by private email, and scheduling acute appointments online when needed.

## 2022-05-13 NOTE — Progress Notes (Signed)
Patient ID: [name], male   DOB: 1941-11-11, 81 y.o.   MRN: [id number]        Chief Complaint: sob       HPI:  [name] is a 81 y.o. male here with c/o worsening dyspnea and cough that seemed to start just after starting trelegy as sample per pulmonary after june 21; has mild sob doe and non prod cough, but denies fever, chills and Pt denies chest pain, wheezing, orthopnea, PND, increased LE swelling, palpitations, dizziness or syncope.   Pt denies polydipsia, polyuria, or new focal neuro s/s.    Pt denies fever, wt loss, night sweats, loss of appetite, or other constitutional symptoms   Cant seem to tolerate trelegy with sample, and is wary of $500 for 3 mo supply as well.  Has hx of elevated left hemidiaphragm per pt.    Wt Readings from Last 3 Encounters:  05/13/22 198 lb (89.8 kg)  05/06/22 206 lb 9.6 oz (93.7 kg)  04/15/22 202 lb (91.6 kg)   BP Readings from Last 3 Encounters:  05/13/22 112/60  05/06/22 118/72  04/15/22 112/70         Past Medical History:  Diagnosis Date   Abdominal pain, epigastric    Allergic rhinitis, cause unspecified    Arthritis    BPH (benign prostatic hyperplasia)    CHF (congestive heart failure) (HCC)    Chronic anticoagulation    xarelto   Coronary artery calcification seen on CAT scan 12/26/2012   Depressive disorder, not elsewhere classified    Diverticulosis of colon (without mention of hemorrhage)    Esophageal reflux    High risk medication use    Hyperlipidemia    Hyperplastic rectal polyp    Hypertension    Lumbago    Pacemaker    Paralyzed hemidiaphragm    LEFT   Persistent atrial fibrillation (HCC)    Sick sinus syndrome ([place]) 05/16/2009   s/p PPM ([employer]) by [name]   Past Surgical History:  Procedure Laterality Date   CYSTOSCOPY WITH RETROGRADE PYELOGRAM, URETEROSCOPY AND STENT PLACEMENT Left 12/30/2021   Procedure: CYSTOSCOPY WITH RETROGRADE PYELOGRAM, URETEROSCOPY;  Surgeon: Remi Haggard, MD;  [facility];  Service: Urology;  Laterality: Left;   ESOPHAGOGASTRODUODENOSCOPY N/A 12/05/2013   Procedure: ESOPHAGOGASTRODUODENOSCOPY (EGD);  Surgeon: [name], MD;  Location: [facility];  Service: Endoscopy;  Laterality: N/A;   ESOPHAGOGASTRODUODENOSCOPY N/A 07/04/2015   Procedure: ESOPHAGOGASTRODUODENOSCOPY (EGD);  Surgeon: [name], MD;  Location: [facility];  Service: Endoscopy;  Laterality: N/A;   EXTRACORPOREAL SHOCK WAVE LITHOTRIPSY Left 10/13/2021   Procedure: EXTRACORPOREAL SHOCK WAVE LITHOTRIPSY (ESWL);  Surgeon: Lucas Mallow, MD;  [facility];  Service: Urology;  Laterality: Left;  90 MINS   INSERT / REPLACE / REMOVE PACEMAKER  05/2009    [employer] [id number]  (serial number [id number]) dual-chamber pacemaker   LEAD REVISION/REPAIR N/A 10/15/2020   Procedure: LEAD REVISION/REPAIR;  Surgeon: Thompson Grayer, MD;  Location: [facility];  Service: Cardiovascular;  Laterality: N/A;   PACEMAKER PLACEMENT Bilateral    '05   PPM GENERATOR CHANGEOUT N/A 10/15/2020   Procedure: PPM GENERATOR CHANGEOUT;  Surgeon: Thompson Grayer, MD;  Location: [facility];  Service: Cardiovascular;  Laterality: N/A;    reports that he quit smoking about 30 years ago. His smoking use included cigarettes. He has a 10.00 pack-year smoking history. He has quit using smokeless tobacco.  His  smokeless tobacco use included chew. He reports that he does not drink alcohol and does not use drugs. family history includes Arthritis in his paternal grandmother; Asthma in his sister; Cancer in his father; Diabetes in his mother; Hypertension in his brother, daughter, father, mother, sister, and son; Lung cancer in his father. No Known Allergies Current Outpatient Medications on File Prior to Visit  Medication Sig Dispense Refill   acetaminophen (TYLENOL) 650 MG CR tablet Take 1,300 mg by mouth in the morning and at bedtime.     albuterol (VENTOLIN HFA) 108  (90 Base) MCG/ACT inhaler Inhale 2 puffs into the lungs every 6 (six) hours as needed for wheezing or shortness of breath. 8 g 6   amLODipine (NORVASC) 10 MG tablet Take 1 tablet (10 mg total) by mouth at bedtime. 90 tablet 3   atorvastatin (LIPITOR) 80 MG tablet TAKE 1 TABLET BY MOUTH EVERY DAY 90 tablet 3   Cholecalciferol (VITAMIN D3) 50 MCG (2000 UT) TABS Take 2,000 Units by mouth daily.     clobetasol cream (TEMOVATE) 0.05 % APPLY TO AFFECTED AREA TWICE A DAY 30 g 2   diclofenac Sodium (VOLTAREN) 1 % GEL Apply topically 4 (four) times daily.     dorzolamide-timolol (COSOPT) 22.3-6.8 MG/ML ophthalmic solution Place 2 drops into both eyes in the morning and at bedtime.     fexofenadine (ALLEGRA) 180 MG tablet TAKE 1 TABLET (180 MG TOTAL) BY MOUTH DAILY. 90 tablet 1   flecainide (TAMBOCOR) 100 MG tablet TAKE 1 TABLET BY MOUTH TWICE A DAY 180 tablet 3   Fluticasone-Umeclidin-Vilant (TRELEGY ELLIPTA) 100-62.5-25 MCG/ACT AEPB Inhale 1 puff into the lungs daily. 192.85 each 2   Fluticasone-Umeclidin-Vilant (TRELEGY ELLIPTA) 100-62.5-25 MCG/ACT AEPB Inhale 1 puff into the lungs daily. 60 each 0   furosemide (LASIX) 20 MG tablet TAKE 1 TABLET IF WEIGHT GAIN IS > 3LBS OVERNIGHT OR >5LBS IN A WEEK NO MORE THAN 2 TABS WEEKLY 90 tablet 0   hydrochlorothiazide (HYDRODIURIL) 25 MG tablet Take 1 tablet (25 mg total) by mouth daily. 30 tablet 2   ketoconazole (NIZORAL) 2 % cream Apply 1 application topically daily. 15 g 0   ketoconazole (NIZORAL) 2 % shampoo as needed for irritation.     latanoprost (XALATAN) 0.005 % ophthalmic solution Place 1 drop into both eyes at bedtime.      meclizine (ANTIVERT) 12.5 MG tablet 1 TAB BY MOUTH EVERY 8 HRS AS NEEDED 30 tablet 5   metoprolol succinate (TOPROL XL) 25 MG 24 hr tablet Take 1 tablet (25 mg total) by mouth at bedtime. 90 tablet 2   omeprazole (PRILOSEC) 40 MG capsule TAKE 1 CAPSULE BY MOUTH EVERY DAY AS NEEDED 90 capsule 2   oxybutynin (DITROPAN-XL) 10 MG 24 hr  tablet TAKE 1 TABLET (10 MG TOTAL) BY MOUTH DAILY. 90 tablet 1   psyllium (METAMUCIL) 0.52 g capsule Take 0.52 g by mouth in the morning and at bedtime.     rivaroxaban (XARELTO) 20 MG TABS tablet TAKE 1 TABLET BY MOUTH DAILY WITH SUPPER. 90 tablet 3   tamsulosin (FLOMAX) 0.4 MG CAPS capsule TAKE 1 CAPSULE BY MOUTH TWICE A DAY 180 capsule 2   telmisartan (MICARDIS) 20 MG tablet Take 1 tablet (20 mg total) by mouth at bedtime. 90 tablet 2   traMADol (ULTRAM) 50 MG tablet TAKE 1 TABLET BY MOUTH EVERY 6 HOURS AS NEEDED. FOR PAIN 120 tablet 2   No current facility-administered medications on file prior to visit.  ROS:  All others reviewed and negative.  Objective        PE:  BP 112/60 (BP Location: Left Arm, Patient Position: Sitting, Cuff Size: Large)   Pulse 68   Temp 98.2 F (36.8 C) (Oral)   Ht '5\' 9"'$  (1.753 m)   Wt 198 lb (89.8 kg)   SpO2 97%   BMI 29.24 kg/m                 Constitutional: Pt appears in NAD, VSS, sat 97%               HENT: Head: NCAT.                Right Ear: External ear normal.                 Left Ear: External ear normal.                Eyes: . Pupils are equal, round, and reactive to light. Conjunctivae and EOM are normal               Nose: without d/c or deformity               Neck: Neck supple. Gross normal ROM               Cardiovascular: Normal rate and regular rhythm.                 Pulmonary/Chest: Effort normal and breath sounds without rales or wheezing. But has decreased BS at the left lower lung field               Abd:  Soft, NT, ND, + BS, no organomegaly               Neurological: Pt is alert. At baseline orientation, motor grossly intact               Skin: Skin is warm. No rashes, no other new lesions, LE edema - none               Psychiatric: Pt behavior is normal without agitation   Micro: none  Cardiac tracings I have personally interpreted today:  none  Pertinent Radiological findings (summarize): none   Lab Results   Component Value Date   WBC 8.4 05/13/2022   HGB 15.4 05/13/2022   HCT 46.5 05/13/2022   PLT 184.0 05/13/2022   GLUCOSE 122 (H) 05/13/2022   CHOL 155 11/24/2021   TRIG 62.0 11/24/2021   HDL 61.10 11/24/2021   LDLDIRECT 183.4 12/26/2013   LDLCALC 81 11/24/2021   ALT 23 05/13/2022   AST 21 05/13/2022   NA 136 05/13/2022   K 3.5 05/13/2022   CL 94 (L) 05/13/2022   CREATININE 1.51 (H) 05/13/2022   BUN 22 05/13/2022   CO2 30 05/13/2022   TSH 1.120 04/03/2022   PSA 2.01 11/24/2021   INR 2.2 (H) 12/09/2020   HGBA1C 6.3 11/24/2021   Assessment/Plan:  [name] is a 81 y.o. Black or African American [2] male with  has a past medical history of Abdominal pain, epigastric, Allergic rhinitis, cause unspecified, Arthritis, BPH (benign prostatic hyperplasia), CHF (congestive heart failure) ([place]), Chronic anticoagulation, Coronary artery calcification seen on CAT scan (12/26/2012), Depressive disorder, not elsewhere classified, Diverticulosis of colon (without mention of hemorrhage), Esophageal reflux, High risk medication use, Hyperlipidemia, Hyperplastic rectal polyp, Hypertension, Lumbago, Pacemaker, Paralyzed hemidiaphragm, Persistent atrial fibrillation ([place]), and Sick sinus syndrome ([place]) (05/16/2009).  Shortness of breath Etiology unclear, but without fever, chills, wheezing, rales or LE swelling and normal o2 sat; pt does appear mild doe - for cxr,  And lab including cbc, to f/u any worsening symptoms or concerns  Hyperglycemia Lab Results  Component Value Date   HGBA1C 6.3 11/24/2021   Stable, pt to continue current medical treatment  - diet, wt control, ativity   Vitamin D deficiency Last vitamin D Lab Results  Component Value Date   VD25OH 34.45 11/24/2021   Low, reminded to start oral replacement   COPD GOLD III with marked reversibility  Pt states unable to tolerate trelegy with current symptoms or at least is too expensive - ok for breztri sample and  coupon  Followup: Return if symptoms worsen or fail to improve.  Cathlean Cower, MD 05/16/2022 7:24 PM [facility]

## 2022-05-14 ENCOUNTER — Other Ambulatory Visit: Payer: Self-pay | Admitting: Internal Medicine

## 2022-05-14 MED ORDER — LEVOFLOXACIN 500 MG PO TABS: 500.0000 mg | ORAL_TABLET | Freq: Every day | ORAL | 0 refills | Status: DC

## 2022-05-16 ENCOUNTER — Encounter: Payer: Self-pay | Admitting: Internal Medicine

## 2022-05-16 NOTE — Assessment & Plan Note (Signed)
Lab Results  Component Value Date   HGBA1C 6.3 11/24/2021   Stable, pt to continue current medical treatment  - diet, wt control, ativity

## 2022-05-16 NOTE — Assessment & Plan Note (Signed)
Last vitamin D Lab Results  Component Value Date   VD25OH 34.45 11/24/2021   Low, reminded to start oral replacement

## 2022-05-16 NOTE — Assessment & Plan Note (Signed)
Pt states unable to tolerate trelegy with current symptoms or at least is too expensive - ok for breztri sample and coupon

## 2022-05-16 NOTE — Assessment & Plan Note (Addendum)
Etiology unclear, but without fever, chills, wheezing, rales or LE swelling and normal o2 sat; pt does appear mild doe - for cxr,  And lab including cbc, to f/u any worsening symptoms or concerns

## 2022-05-18 ENCOUNTER — Inpatient Hospital Stay (HOSPITAL_BASED_OUTPATIENT_CLINIC_OR_DEPARTMENT_OTHER)
Admission: EM | Admit: 2022-05-18 | Discharge: 2022-05-22 | DRG: 194 | Disposition: A | Discharge status: Home / Self Care | Payer: Medicare Other | Attending: Internal Medicine | Admitting: Internal Medicine

## 2022-05-18 ENCOUNTER — Encounter (HOSPITAL_BASED_OUTPATIENT_CLINIC_OR_DEPARTMENT_OTHER): Payer: Self-pay | Admitting: Emergency Medicine

## 2022-05-18 ENCOUNTER — Emergency Department (HOSPITAL_BASED_OUTPATIENT_CLINIC_OR_DEPARTMENT_OTHER): Payer: Medicare Other | Admitting: Radiology

## 2022-05-18 ENCOUNTER — Emergency Department (HOSPITAL_BASED_OUTPATIENT_CLINIC_OR_DEPARTMENT_OTHER): Payer: Medicare Other

## 2022-05-18 ENCOUNTER — Telehealth: Payer: Self-pay

## 2022-05-18 ENCOUNTER — Other Ambulatory Visit: Payer: Self-pay

## 2022-05-18 DIAGNOSIS — Z825 Family history of asthma and other chronic lower respiratory diseases: Secondary | ICD-10-CM

## 2022-05-18 DIAGNOSIS — I495 Sick sinus syndrome: Secondary | ICD-10-CM | POA: Diagnosis present

## 2022-05-18 DIAGNOSIS — E861 Hypovolemia: Secondary | ICD-10-CM | POA: Diagnosis present

## 2022-05-18 DIAGNOSIS — D72828 Other elevated white blood cell count: Secondary | ICD-10-CM | POA: Diagnosis present

## 2022-05-18 DIAGNOSIS — E785 Hyperlipidemia, unspecified: Secondary | ICD-10-CM | POA: Diagnosis present

## 2022-05-18 DIAGNOSIS — J189 Pneumonia, unspecified organism: Secondary | ICD-10-CM | POA: Diagnosis not present

## 2022-05-18 DIAGNOSIS — E860 Dehydration: Secondary | ICD-10-CM | POA: Diagnosis present

## 2022-05-18 DIAGNOSIS — Z87891 Personal history of nicotine dependence: Secondary | ICD-10-CM | POA: Diagnosis not present

## 2022-05-18 DIAGNOSIS — I110 Hypertensive heart disease with heart failure: Secondary | ICD-10-CM | POA: Diagnosis present

## 2022-05-18 DIAGNOSIS — N179 Acute kidney failure, unspecified: Secondary | ICD-10-CM | POA: Diagnosis present

## 2022-05-18 DIAGNOSIS — Z950 Presence of cardiac pacemaker: Secondary | ICD-10-CM

## 2022-05-18 DIAGNOSIS — I10 Essential (primary) hypertension: Secondary | ICD-10-CM | POA: Diagnosis present

## 2022-05-18 DIAGNOSIS — E669 Obesity, unspecified: Secondary | ICD-10-CM

## 2022-05-18 DIAGNOSIS — Z801 Family history of malignant neoplasm of trachea, bronchus and lung: Secondary | ICD-10-CM | POA: Diagnosis not present

## 2022-05-18 DIAGNOSIS — Z7901 Long term (current) use of anticoagulants: Secondary | ICD-10-CM

## 2022-05-18 DIAGNOSIS — Z833 Family history of diabetes mellitus: Secondary | ICD-10-CM

## 2022-05-18 DIAGNOSIS — J449 Chronic obstructive pulmonary disease, unspecified: Secondary | ICD-10-CM | POA: Diagnosis not present

## 2022-05-18 DIAGNOSIS — N400 Enlarged prostate without lower urinary tract symptoms: Secondary | ICD-10-CM | POA: Diagnosis present

## 2022-05-18 DIAGNOSIS — E871 Hypo-osmolality and hyponatremia: Secondary | ICD-10-CM | POA: Diagnosis not present

## 2022-05-18 DIAGNOSIS — J440 Chronic obstructive pulmonary disease with acute lower respiratory infection: Secondary | ICD-10-CM | POA: Diagnosis present

## 2022-05-18 DIAGNOSIS — I2510 Atherosclerotic heart disease of native coronary artery without angina pectoris: Secondary | ICD-10-CM | POA: Diagnosis present

## 2022-05-18 DIAGNOSIS — Z7951 Long term (current) use of inhaled steroids: Secondary | ICD-10-CM

## 2022-05-18 DIAGNOSIS — E878 Other disorders of electrolyte and fluid balance, not elsewhere classified: Secondary | ICD-10-CM | POA: Diagnosis present

## 2022-05-18 DIAGNOSIS — E876 Hypokalemia: Secondary | ICD-10-CM | POA: Diagnosis not present

## 2022-05-18 DIAGNOSIS — R0602 Shortness of breath: Secondary | ICD-10-CM | POA: Diagnosis not present

## 2022-05-18 DIAGNOSIS — Z8249 Family history of ischemic heart disease and other diseases of the circulatory system: Secondary | ICD-10-CM | POA: Diagnosis not present

## 2022-05-18 DIAGNOSIS — Z79899 Other long term (current) drug therapy: Secondary | ICD-10-CM | POA: Diagnosis not present

## 2022-05-18 DIAGNOSIS — I480 Paroxysmal atrial fibrillation: Secondary | ICD-10-CM

## 2022-05-18 DIAGNOSIS — I5032 Chronic diastolic (congestive) heart failure: Secondary | ICD-10-CM | POA: Diagnosis present

## 2022-05-18 DIAGNOSIS — J168 Pneumonia due to other specified infectious organisms: Secondary | ICD-10-CM | POA: Diagnosis not present

## 2022-05-18 DIAGNOSIS — R531 Weakness: Secondary | ICD-10-CM | POA: Diagnosis not present

## 2022-05-18 DIAGNOSIS — I4819 Other persistent atrial fibrillation: Secondary | ICD-10-CM | POA: Diagnosis not present

## 2022-05-18 LAB — CBC WITH DIFFERENTIAL/PLATELET
Abs Immature Granulocytes: 0.03 10*3/uL (ref 0.00–0.07)
Basophils Absolute: 0 10*3/uL (ref 0.0–0.1)
Basophils Relative: 0 %
Eosinophils Absolute: 0 10*3/uL (ref 0.0–0.5)
Eosinophils Relative: 0 %
HCT: 48.3 % (ref 39.0–52.0)
Hemoglobin: 16.2 g/dL (ref 13.0–17.0)
Immature Granulocytes: 0 %
Lymphocytes Relative: 14 %
Lymphs Abs: 1.4 10*3/uL (ref 0.7–4.0)
MCH: 29.7 pg (ref 26.0–34.0)
MCHC: 33.5 g/dL (ref 30.0–36.0)
MCV: 88.5 fL (ref 80.0–100.0)
Monocytes Absolute: 1 10*3/uL (ref 0.1–1.0)
Monocytes Relative: 10 %
Neutro Abs: 7.9 10*3/uL — ABNORMAL HIGH (ref 1.7–7.7)
Neutrophils Relative %: 76 %
Platelets: 202 10*3/uL (ref 150–400)
RBC: 5.46 MIL/uL (ref 4.22–5.81)
RDW: 12.7 % (ref 11.5–15.5)
WBC: 10.4 10*3/uL (ref 4.0–10.5)
nRBC: 0 % (ref 0.0–0.2)

## 2022-05-18 LAB — COMPREHENSIVE METABOLIC PANEL
ALT: 19 U/L (ref 0–44)
AST: 19 U/L (ref 15–41)
Albumin: 5 g/dL (ref 3.5–5.0)
Alkaline Phosphatase: 59 U/L (ref 38–126)
Anion gap: 15 (ref 5–15)
BUN: 39 mg/dL — ABNORMAL HIGH (ref 8–23)
CO2: 26 mmol/L (ref 22–32)
Calcium: 10.9 mg/dL — ABNORMAL HIGH (ref 8.9–10.3)
Chloride: 88 mmol/L — ABNORMAL LOW (ref 98–111)
Creatinine, Ser: 2.03 mg/dL — ABNORMAL HIGH (ref 0.61–1.24)
GFR, Estimated: 33 mL/min — ABNORMAL LOW (ref >60)
Glucose, Bld: 127 mg/dL — ABNORMAL HIGH (ref 70–99)
Potassium: 3.7 mmol/L (ref 3.5–5.1)
Sodium: 129 mmol/L — ABNORMAL LOW (ref 135–145)
Total Bilirubin: 1.5 mg/dL — ABNORMAL HIGH (ref 0.3–1.2)
Total Protein: 8.6 g/dL — ABNORMAL HIGH (ref 6.5–8.1)

## 2022-05-18 LAB — BRAIN NATRIURETIC PEPTIDE: B Natriuretic Peptide: 11.9 pg/mL (ref 0.0–100.0)

## 2022-05-18 LAB — LACTIC ACID, PLASMA: Lactic Acid, Venous: 1.7 mmol/L (ref 0.5–1.9)

## 2022-05-18 LAB — TROPONIN I (HIGH SENSITIVITY): Troponin I (High Sensitivity): 6 ng/L (ref <18)

## 2022-05-18 MED ORDER — SODIUM CHLORIDE 0.9 % IV SOLN
Freq: Once | INTRAVENOUS | Status: AC
Start: 2022-05-18 — End: 2022-05-19

## 2022-05-18 MED ORDER — SODIUM CHLORIDE 0.9 % IV BOLUS
500.0000 mL | Freq: Once | INTRAVENOUS | Status: AC
  Administered 2022-05-18: 500 mL via INTRAVENOUS

## 2022-05-18 MED ORDER — SODIUM CHLORIDE 0.9 % IV SOLN: Freq: Once | INTRAVENOUS | Status: AC

## 2022-05-18 NOTE — ED Notes (Signed)
Per Dr. Lavone Orn, he has not heard from the hospitalist for this patient.  Called [facility] and s/w Kim who advised that the hospitalist has already marked consult as complete and has requested a bed.  She was going to reach out to hospitalist and have them call our physician.  Advised Dr. Lavone Orn of what [facility] stated.  Kim at [facility] then called back and advised that she had paged the hospitalist again and we should receive a call.

## 2022-05-18 NOTE — ED Triage Notes (Signed)
Pt diagnosed with pneumonia on 6/28 by PCP, sent home and on antibiotics.  Pt c/o no appetite, generalized weakness and sob with exertion.    GCS 15  alert and oriented.

## 2022-05-18 NOTE — ED Provider Notes (Signed)
[facility] EMERGENCY DEPT Provider Note   CSN: [id number] Arrival date & time: 05/18/22  1548     History  Chief Complaint  Patient presents with   Shortness of Breath    [name] is a 81 y.o. male.   Shortness of Breath  Patient is a 81 year old male with a past medical history significant for PAF on Xarelto, coronary artery calcifications, sick sinus syndrome with [employer] PPM, BPH, CHF last echocardiogram done approximately a month ago with EF 55-60 %  Patient was found to have a pneumonia 6/29 after he was seen by his primary care provider and diagnosed with pneumonia.  He has been taking Levaquin since he was prescribed this.  He denies any missed doses.  He states he has been getting progressively more weak and fatigued he states he is now getting more short of breath especially with exertion he denies any chest pain or hemoptysis.  Denies any lower extremity swelling.  He states that he is overall weaker and he states that he has no appetite has been eating and drinking less.      Home Medications Prior to Admission medications   Medication Sig Start Date End Date Taking? Authorizing Provider  acetaminophen (TYLENOL) 650 MG CR tablet Take 1,300 mg by mouth in the morning and at bedtime.    [provider]  albuterol (VENTOLIN HFA) 108 (90 Base) MCG/ACT inhaler Inhale 2 puffs into the lungs every 6 (six) hours as needed for wheezing or shortness of breath. 05/06/22   Olalere, Adewale A, MD  amLODipine (NORVASC) 10 MG tablet Take 1 tablet (10 mg total) by mouth at bedtime. 03/27/22   Early Osmond, MD  atorvastatin (LIPITOR) 80 MG tablet TAKE 1 TABLET BY MOUTH EVERY DAY 01/13/22   Biagio Borg, MD  Cholecalciferol (VITAMIN D3) 50 MCG (2000 UT) TABS Take 2,000 Units by mouth daily.    [provider]  clobetasol cream (TEMOVATE) 0.05 % APPLY TO AFFECTED AREA TWICE A DAY 06/20/21   Biagio Borg, MD  diclofenac Sodium (VOLTAREN) 1 %  GEL Apply topically 4 (four) times daily.    [provider]  dorzolamide-timolol (COSOPT) 22.3-6.8 MG/ML ophthalmic solution Place 2 drops into both eyes in the morning and at bedtime. 01/10/20   [provider]  fexofenadine (ALLEGRA) 180 MG tablet TAKE 1 TABLET (180 MG TOTAL) BY MOUTH DAILY. 12/10/15   Biagio Borg, MD  flecainide (TAMBOCOR) 100 MG tablet TAKE 1 TABLET BY MOUTH TWICE A DAY 12/15/21   Shirley Friar, PA-C  Fluticasone-Umeclidin-Vilant (TRELEGY ELLIPTA) 100-62.5-25 MCG/ACT AEPB Inhale 1 puff into the lungs daily. 05/06/22   Olalere, Ernesto Rutherford, MD  Fluticasone-Umeclidin-Vilant (TRELEGY ELLIPTA) 100-62.5-25 MCG/ACT AEPB Inhale 1 puff into the lungs daily. 05/06/22   Olalere, Cicero Duck A, MD  furosemide (LASIX) 20 MG tablet TAKE 1 TABLET IF WEIGHT GAIN IS > 3LBS OVERNIGHT OR >5LBS IN A WEEK NO MORE THAN 2 TABS WEEKLY 05/07/22   Biagio Borg, MD  hydrochlorothiazide (HYDRODIURIL) 25 MG tablet Take 1 tablet (25 mg total) by mouth daily. 05/06/22   Olalere, Cicero Duck A, MD  ketoconazole (NIZORAL) 2 % cream Apply 1 application topically daily. 09/01/21   Biagio Borg, MD  ketoconazole (NIZORAL) 2 % shampoo as needed for irritation. 10/28/21   [provider]  latanoprost (XALATAN) 0.005 % ophthalmic solution Place 1 drop into both eyes at bedtime.     [provider]  levofloxacin (LEVAQUIN) 500 MG tablet  Take 1 tablet (500 mg total) by mouth daily for 10 days. 05/14/22 05/24/22  Biagio Borg, MD  meclizine (ANTIVERT) 12.5 MG tablet 1 TAB BY MOUTH EVERY 8 HRS AS NEEDED 03/03/22   Biagio Borg, MD  metoprolol succinate (TOPROL XL) 25 MG 24 hr tablet Take 1 tablet (25 mg total) by mouth at bedtime. 03/27/22   Early Osmond, MD  omeprazole (PRILOSEC) 40 MG capsule TAKE 1 CAPSULE BY MOUTH EVERY DAY AS NEEDED 12/15/21   Biagio Borg, MD  oxybutynin (DITROPAN-XL) 10 MG 24 hr tablet TAKE 1 TABLET (10 MG TOTAL) BY MOUTH DAILY. 08/02/15   Biagio Borg, MD   psyllium (METAMUCIL) 0.52 g capsule Take 0.52 g by mouth in the morning and at bedtime.    [provider]  rivaroxaban (XARELTO) 20 MG TABS tablet TAKE 1 TABLET BY MOUTH DAILY WITH SUPPER. 09/16/21   Biagio Borg, MD  tamsulosin (FLOMAX) 0.4 MG CAPS capsule TAKE 1 CAPSULE BY MOUTH TWICE A DAY 07/09/21   Biagio Borg, MD  telmisartan (MICARDIS) 20 MG tablet Take 1 tablet (20 mg total) by mouth at bedtime. 03/27/22   Early Osmond, MD  traMADol (ULTRAM) 50 MG tablet TAKE 1 TABLET BY MOUTH EVERY 6 HOURS AS NEEDED. FOR PAIN 04/08/22   Biagio Borg, MD      Allergies    Patient has no known allergies.    Review of Systems   Review of Systems  Respiratory:  Positive for shortness of breath.     Physical Exam Updated Vital Signs BP 115/81   Pulse (!) 58   Temp 98.1 F (36.7 C)   Resp 18   Ht '5\' 9"'$  (1.753 m)   Wt 93.4 kg   SpO2 96%   BMI 30.42 kg/m  Physical Exam Vitals and nursing note reviewed.  Constitutional:      General: He is not in acute distress.    Comments: Ill-appearing 81 year old gentleman.  Nontoxic.  No diaphoresis.  Speaking in full sentences, pleasant.  HENT:     Head: Normocephalic and atraumatic.     Nose: Nose normal.     Mouth/Throat:     Mouth: Mucous membranes are dry.     Comments: Dry oral mucosa with slightly white tongue Eyes:     General: No scleral icterus. Cardiovascular:     Rate and Rhythm: Normal rate and regular rhythm.     Pulses: Normal pulses.     Heart sounds: Normal heart sounds.  Pulmonary:     Effort: Pulmonary effort is normal. No respiratory distress.     Breath sounds: No wheezing.  Abdominal:     Palpations: Abdomen is soft.     Tenderness: There is no abdominal tenderness. There is no guarding or rebound.  Musculoskeletal:     Cervical back: Normal range of motion.     Right lower leg: No edema.     Left lower leg: No edema.  Skin:    General: Skin is warm and dry.     Capillary Refill: Capillary refill takes  less than 2 seconds.  Neurological:     Mental Status: He is alert. Mental status is at baseline.     Comments: Moves all 4 extremities, diffuse symmetric weakness.  Psychiatric:        Mood and Affect: Mood normal.        Behavior: Behavior normal.    ED Results / Procedures / Treatments   Labs (all  labs ordered are listed, but only abnormal results are displayed) Labs Reviewed  CBC WITH DIFFERENTIAL/PLATELET - Abnormal; Notable for the following components:      Result Value   Neutro Abs 7.9 (*)    All other components within normal limits  COMPREHENSIVE METABOLIC PANEL - Abnormal; Notable for the following components:   Sodium 129 (*)    Chloride 88 (*)    Glucose, Bld 127 (*)    BUN 39 (*)    Creatinine, Ser 2.03 (*)    Calcium 10.9 (*)    Total Protein 8.6 (*)    Total Bilirubin 1.5 (*)    GFR, Estimated 33 (*)    All other components within normal limits  LACTIC ACID, PLASMA  URINALYSIS, ROUTINE W REFLEX MICROSCOPIC  TROPONIN I (HIGH SENSITIVITY)    EKG None  Radiology DG Chest 2 View  Result Date: 05/18/2022 CLINICAL DATA:  Generalized weakness and shortness of breath with exertion. Patient diagnosed with pneumonia on 06/28 PCP and sent home with antibiotics. EXAM: CHEST - 2 VIEW COMPARISON:  Chest radiograph 05/13/2022. FINDINGS: Left chest wall pacemaker with leads overlying the right atrium, right ventricle, and coronary vein. Stable eventration of the left hemidiaphragm. Heart size is difficult to assess due to elevation of the right hemidiaphragm but appears stable. Aortic calcifications. Minimal patchy airspace opacity in the medial aspect of the left lower lobe, obscuring the border of the thoracic aorta. No pleural effusion or pneumothorax. IMPRESSION: Minimal patchy airspace opacity in the medial left lower lobe, not significantly changed compared to 05/13/2022. No new focal consolidation. No pleural effusion. Electronically Signed   By: Ileana Roup M.D.   On:  05/18/2022 17:27    Procedures Procedures    Medications Ordered in ED Medications  0.9 %  sodium chloride infusion ( Intravenous New Bag/Given 05/18/22 2002)  sodium chloride 0.9 % bolus 500 mL (500 mLs Intravenous New Bag/Given 05/18/22 2001)    ED Course/ Medical Decision Making/ A&P                           Medical Decision Making Amount and/or Complexity of Data Reviewed Labs: ordered. Radiology: ordered.  Risk Prescription drug management. Decision regarding hospitalization.   This patient presents to the ED for concern of fatigue, decreased appetite, shortness of breath, this involves a number of treatment options, and is a complaint that carries with it a moderate to high risk of complications and morbidity.  The differential diagnosis includes most likely pneumonia and failure to thrive secondary to this.   The causes for shortness of breath include but are not limited to Cardiac (AHF, pericardial effusion and tamponade, arrhythmias, ischemia, etc) Respiratory (COPD, asthma, pneumonia, pneumothorax, primary pulmonary hypertension, PE/VQ mismatch) Hematological (anemia) Neuromuscular (ALS, Guillain-Barr, etc)    Co morbidities: Discussed in HPI   Brief History:  Patient is a 81 year old male with a past medical history significant for PAF on Xarelto, coronary artery calcifications, sick sinus syndrome with [employer] PPM, BPH, CHF last echocardiogram done approximately a month ago with EF 55-60 %  Patient was found to have a pneumonia 6/29 after he was seen by his primary care provider and diagnosed with pneumonia.  He has been taking Levaquin since he was prescribed this.  He denies any missed doses.  He states he has been getting progressively more weak and fatigued he states he is now getting more short of breath especially with exertion he denies any  chest pain or hemoptysis.  Denies any lower extremity swelling.  He states that he is overall weaker and he  states that he has no appetite has been eating and drinking less.    EMR reviewed including pt PMHx, past surgical history and past visits to ER.   See HPI for more details   Lab Tests:   I ordered and independently interpreted labs. Labs notable for leukocytosis of 10.4 with neutrophil predominance indicating a left shift.  CMP with mild hyponatremia of 129, hypochloremia of 88, BUN elevated at 39 creatinine increased to 2.03 (5 months ago creat '@1'$ ).    Imaging Studies:  Abnormal findings. I personally reviewed all imaging studies. Imaging notable for Small patchy opacity in left lower lung consistent with pneumonia.  Will obtain CT chest without contrast   Cardiac Monitoring:  The patient was maintained on a cardiac monitor.  I personally viewed and interpreted the cardiac monitored which showed an underlying rhythm of: Paced rhythm EKG non-ischemic   Medicines ordered:  I ordered medication including 500 mL normal saline, maintenance fluids of 150 mL/h of normal saline initially however after consultation for admission changed IV fluid rate to 100 mL/h for hydration Reevaluation of the patient after these medicines showed that the patient stayed the same I have reviewed the patients home medicines and have made adjustments as needed   Critical Interventions:     Consults/Attending Physician   I requested consultation with hospitalist Dr. Cleon Dew,  and discussed lab and imaging findings as well as pertinent plan - they recommend: Admission and CT scan of chest without contrast for any evidence of malignancy  I discussed this case with my attending physician who cosigned this note including patient's presenting symptoms, physical exam, and planned diagnostics and interventions. Attending physician stated agreement with plan or made changes to plan which were implemented.   Reevaluation:  After the interventions noted above I re-evaluated patient and found that they  have :stayed the same   Social Determinants of Health:      Problem List / ED Course:  Pneumonia AKI Failure to thrive   Dispostion:  After consideration of the diagnostic results and the patients response to treatment, I feel that the patent would benefit from admission   Final Clinical Impression(s) / ED Diagnoses Final diagnoses:  AKI (acute kidney injury) ([place])  Community acquired pneumonia of left lower lobe of lung  Dehydration    Rx / DC Orders ED Discharge Orders     None         Tedd Sias, [place] 05/18/22 2217    Lucrezia Starch, MD 05/19/22 [phone]

## 2022-05-19 DIAGNOSIS — E669 Obesity, unspecified: Secondary | ICD-10-CM

## 2022-05-19 DIAGNOSIS — I480 Paroxysmal atrial fibrillation: Secondary | ICD-10-CM

## 2022-05-19 DIAGNOSIS — Z7951 Long term (current) use of inhaled steroids: Secondary | ICD-10-CM | POA: Diagnosis not present

## 2022-05-19 DIAGNOSIS — Z8249 Family history of ischemic heart disease and other diseases of the circulatory system: Secondary | ICD-10-CM | POA: Diagnosis not present

## 2022-05-19 DIAGNOSIS — I495 Sick sinus syndrome: Secondary | ICD-10-CM | POA: Diagnosis not present

## 2022-05-19 DIAGNOSIS — I5032 Chronic diastolic (congestive) heart failure: Secondary | ICD-10-CM | POA: Diagnosis not present

## 2022-05-19 DIAGNOSIS — Z87891 Personal history of nicotine dependence: Secondary | ICD-10-CM | POA: Diagnosis not present

## 2022-05-19 DIAGNOSIS — N179 Acute kidney failure, unspecified: Secondary | ICD-10-CM | POA: Diagnosis not present

## 2022-05-19 DIAGNOSIS — Z801 Family history of malignant neoplasm of trachea, bronchus and lung: Secondary | ICD-10-CM | POA: Diagnosis not present

## 2022-05-19 DIAGNOSIS — E861 Hypovolemia: Secondary | ICD-10-CM | POA: Diagnosis not present

## 2022-05-19 DIAGNOSIS — Z825 Family history of asthma and other chronic lower respiratory diseases: Secondary | ICD-10-CM | POA: Diagnosis not present

## 2022-05-19 DIAGNOSIS — E871 Hypo-osmolality and hyponatremia: Secondary | ICD-10-CM | POA: Diagnosis not present

## 2022-05-19 DIAGNOSIS — I2510 Atherosclerotic heart disease of native coronary artery without angina pectoris: Secondary | ICD-10-CM | POA: Diagnosis not present

## 2022-05-19 DIAGNOSIS — I4819 Other persistent atrial fibrillation: Secondary | ICD-10-CM | POA: Diagnosis not present

## 2022-05-19 DIAGNOSIS — E785 Hyperlipidemia, unspecified: Secondary | ICD-10-CM | POA: Diagnosis not present

## 2022-05-19 DIAGNOSIS — J189 Pneumonia, unspecified organism: Principal | ICD-10-CM

## 2022-05-19 DIAGNOSIS — E878 Other disorders of electrolyte and fluid balance, not elsewhere classified: Secondary | ICD-10-CM | POA: Diagnosis not present

## 2022-05-19 DIAGNOSIS — Z7901 Long term (current) use of anticoagulants: Secondary | ICD-10-CM | POA: Diagnosis not present

## 2022-05-19 DIAGNOSIS — E860 Dehydration: Secondary | ICD-10-CM | POA: Diagnosis not present

## 2022-05-19 DIAGNOSIS — Z950 Presence of cardiac pacemaker: Secondary | ICD-10-CM | POA: Diagnosis not present

## 2022-05-19 DIAGNOSIS — D72828 Other elevated white blood cell count: Secondary | ICD-10-CM | POA: Diagnosis not present

## 2022-05-19 DIAGNOSIS — N400 Enlarged prostate without lower urinary tract symptoms: Secondary | ICD-10-CM | POA: Diagnosis not present

## 2022-05-19 DIAGNOSIS — I110 Hypertensive heart disease with heart failure: Secondary | ICD-10-CM | POA: Diagnosis not present

## 2022-05-19 DIAGNOSIS — R0602 Shortness of breath: Secondary | ICD-10-CM | POA: Diagnosis present

## 2022-05-19 DIAGNOSIS — Z79899 Other long term (current) drug therapy: Secondary | ICD-10-CM | POA: Diagnosis not present

## 2022-05-19 DIAGNOSIS — J440 Chronic obstructive pulmonary disease with acute lower respiratory infection: Secondary | ICD-10-CM | POA: Diagnosis not present

## 2022-05-19 LAB — URINALYSIS, ROUTINE W REFLEX MICROSCOPIC
Bilirubin Urine: NEGATIVE
Glucose, UA: NEGATIVE mg/dL
Hgb urine dipstick: NEGATIVE
Ketones, ur: NEGATIVE mg/dL
Leukocytes,Ua: NEGATIVE
Nitrite: NEGATIVE
Protein, ur: NEGATIVE mg/dL
Specific Gravity, Urine: 1.014 (ref 1.005–1.030)
pH: 6 (ref 5.0–8.0)

## 2022-05-19 LAB — RESPIRATORY PANEL BY PCR

## 2022-05-19 LAB — STREP PNEUMONIAE URINARY ANTIGEN: Strep Pneumo Urinary Antigen: NEGATIVE

## 2022-05-19 MED ORDER — PANTOPRAZOLE SODIUM 40 MG PO TBEC
40.0000 mg | DELAYED_RELEASE_TABLET | Freq: Every day | ORAL | Status: DC
  Administered 2022-05-19 – 2022-05-22 (×4): 40 mg via ORAL
  Filled 2022-05-19 (×4): qty 1

## 2022-05-19 MED ORDER — FLECAINIDE ACETATE 100 MG PO TABS
100.0000 mg | ORAL_TABLET | Freq: Two times a day (BID) | ORAL | Status: DC
Start: 2022-05-19 — End: 2022-05-22
  Administered 2022-05-19 – 2022-05-22 (×6): 100 mg via ORAL
  Filled 2022-05-19 (×6): qty 1

## 2022-05-19 MED ORDER — TRAMADOL HCL 50 MG PO TABS
50.0000 mg | ORAL_TABLET | Freq: Four times a day (QID) | ORAL | Status: DC | PRN
  Administered 2022-05-21 – 2022-05-22 (×3): 50 mg via ORAL
  Filled 2022-05-19 (×3): qty 1

## 2022-05-19 MED ORDER — PSYLLIUM 0.52 G PO CAPS: 0.5200 g | ORAL_CAPSULE | Freq: Every day | ORAL | Status: DC

## 2022-05-19 MED ORDER — SODIUM CHLORIDE 0.9 % IV SOLN
1.0000 g | Freq: Once | INTRAVENOUS | Status: AC
  Administered 2022-05-19: 1 g via INTRAVENOUS
  Filled 2022-05-19: qty 10

## 2022-05-19 MED ORDER — SODIUM CHLORIDE 0.9 % IV SOLN
2.0000 g | INTRAVENOUS | Status: DC
  Administered 2022-05-19 – 2022-05-21 (×3): 2 g via INTRAVENOUS
  Filled 2022-05-19 (×4): qty 20

## 2022-05-19 MED ORDER — POLYETHYLENE GLYCOL 3350 17 G PO PACK: 17.0000 g | PACK | Freq: Every day | ORAL | Status: DC | PRN

## 2022-05-19 MED ORDER — BUDESONIDE 0.25 MG/2ML IN SUSP
0.2500 mg | Freq: Two times a day (BID) | RESPIRATORY_TRACT | Status: DC
Start: 2022-05-19 — End: 2022-05-22
  Administered 2022-05-19 – 2022-05-22 (×5): 0.25 mg via RESPIRATORY_TRACT
  Filled 2022-05-19 (×6): qty 2

## 2022-05-19 MED ORDER — LATANOPROST 0.005 % OP SOLN
1.0000 [drp] | Freq: Every day | OPHTHALMIC | Status: DC
Start: 2022-05-19 — End: 2022-05-22
  Administered 2022-05-19 – 2022-05-21 (×3): 1 [drp] via OPHTHALMIC
  Filled 2022-05-19: qty 2.5

## 2022-05-19 MED ORDER — RIVAROXABAN 15 MG PO TABS
15.0000 mg | ORAL_TABLET | Freq: Every day | ORAL | Status: DC
  Administered 2022-05-19: 15 mg via ORAL
  Filled 2022-05-19: qty 1

## 2022-05-19 MED ORDER — RIVAROXABAN 20 MG PO TABS: 20.0000 mg | ORAL_TABLET | Freq: Every day | ORAL | Status: DC

## 2022-05-19 MED ORDER — DOXYCYCLINE HYCLATE 100 MG PO TABS
100.0000 mg | ORAL_TABLET | Freq: Two times a day (BID) | ORAL | Status: DC
  Administered 2022-05-19 – 2022-05-22 (×7): 100 mg via ORAL
  Filled 2022-05-19 (×8): qty 1

## 2022-05-19 MED ORDER — SODIUM CHLORIDE 0.9 % IV SOLN
INTRAVENOUS | Status: DC
Start: 2022-05-19 — End: 2022-05-22

## 2022-05-19 MED ORDER — ACETAMINOPHEN 650 MG RE SUPP: 650.0000 mg | Freq: Four times a day (QID) | RECTAL | Status: DC | PRN

## 2022-05-19 MED ORDER — VITAMIN D 25 MCG (1000 UNIT) PO TABS
2000.0000 [IU] | ORAL_TABLET | Freq: Every day | ORAL | Status: DC
  Administered 2022-05-20 – 2022-05-22 (×3): 2000 [IU] via ORAL
  Filled 2022-05-19 (×3): qty 2

## 2022-05-19 MED ORDER — DORZOLAMIDE HCL-TIMOLOL MAL 2-0.5 % OP SOLN
2.0000 [drp] | Freq: Two times a day (BID) | OPHTHALMIC | Status: DC
Start: 2022-05-19 — End: 2022-05-20
  Administered 2022-05-19 – 2022-05-20 (×2): 2 [drp] via OPHTHALMIC
  Filled 2022-05-19: qty 10

## 2022-05-19 MED ORDER — ACETAMINOPHEN 325 MG PO TABS: 650.0000 mg | ORAL_TABLET | Freq: Four times a day (QID) | ORAL | Status: DC | PRN

## 2022-05-19 MED ORDER — ALBUTEROL SULFATE (2.5 MG/3ML) 0.083% IN NEBU
2.5000 mg | INHALATION_SOLUTION | RESPIRATORY_TRACT | Status: DC | PRN
  Administered 2022-05-21: 2.5 mg via RESPIRATORY_TRACT
  Filled 2022-05-19: qty 3

## 2022-05-19 MED ORDER — ONDANSETRON HCL 4 MG/2ML IJ SOLN: 4.0000 mg | Freq: Four times a day (QID) | INTRAMUSCULAR | Status: DC | PRN

## 2022-05-19 MED ORDER — ATORVASTATIN CALCIUM 40 MG PO TABS
80.0000 mg | ORAL_TABLET | Freq: Every day | ORAL | Status: DC
  Administered 2022-05-19 – 2022-05-21 (×3): 80 mg via ORAL
  Filled 2022-05-19 (×3): qty 2

## 2022-05-19 MED ORDER — ONDANSETRON HCL 4 MG PO TABS: 4.0000 mg | ORAL_TABLET | Freq: Four times a day (QID) | ORAL | Status: DC | PRN

## 2022-05-19 NOTE — Progress Notes (Signed)
Plan of Care Note for accepted transfer   Patient: [name] MRN: [id number]   [place]: 05/18/2022  Facility requesting transfer: [facility] Requesting Provider: PA Pati Gallo Reason for transfer: Left Lower Lobe Pneumonia not responding to [place] course:   81 year old male with past medical history of Diastolic congestive heart failure, hyperlipidemia, coronary artery disease (seen on coronary CTA), hypertension, paroxysmal atrial fibrillation, sick sinus syndrome status post pacemaker placement presenting to [facility] emergency department with complaints of progressively worsening generalized weakness shortness of breath and poor appetite.  Patient explains a several week history of progressively worsening shortness of breath, generalized weakness and poor appetite.  Patient followed up with his primary care provider on 6/29 and was diagnosed with pneumonia.  Patient was prescribed Levaquin and despite taking this medication as instructed patient continued to experience progressively worsening symptoms.  Upon evaluation in the emergency department CT imaging of the chest without contrast reveals a moderate severity left lower lobe consolidation with recommendation for repeat imaging after treatment of suspected pneumonia to ensure there is no evidence of underlying malignancy.  Patient was initiated on intravenous ceftriaxone and doxycycline.   ER provider requesting hospitalization for continued treatment.  Plan of care: The patient is accepted for admission to Telemetry unit, at [facility]..    Author: Vernelle Emerald, MD 05/19/2022  Check [email or web address] for on-call coverage.  Nursing staff, Please call [place] number on Amion as soon as patient's arrival, so appropriate admitting provider can evaluate the pt.

## 2022-05-19 NOTE — H&P (Signed)
History and Physical    [name] ASN:[id number] DOB: [birth year] DOA: 05/18/2022  PCP: Biagio Borg, MD   Patient coming from: home   Chief Complaint  Patient presents with   Shortness of Breath    HPI: 17yom w/ history of chronic diastolic CHF, HLD, CAD-(seen on coronary CTA), hypertension, paroxysmal atrial fibrillation, sick sinus syndrome  s/p PPM presented to ED with complaints of progressively worsening generalized weakness, shortness of breath, poor appetite.  Recently seen by PCP on 6/29, diagnosed with pneumonia-had labs that showed creatinine 1.5, D-dimer less than 0.19 WBC count 8.4 BNP 19, and prescribed Levaquin, but symptoms have been worsening despite being on antibiotics. In the ED vitals stable afebrile initially blood pressure in 90s, labs showed normal lactic acid, troponin 6-BMP with hyponatremia at 129, AKI with creatinine 2.0, WBC 10.4 UA unremarkable, blood culture sent,CT chest w/o contrast-moderate severity left lower lobe consolidation with recommendation for repeat imaging after treatment of suspected pneumonia to ensure there is no evidence of underlying malignancy.  Started on IV ceftriaxone doxycycline and admission requested for further management. Patient arrived to [facility] 7/4 5 PM Currently heart rate is stable, on room air. He denies any nausea, vomiting,headache, focal weakness, numbness tingling, speech difficulties, focal weakness, blackish stool rectal bleeding    Assessment/Plan Principal Problem:   Pneumonia of left lower lobe due to infectious organism Active Problems:   Coronary artery calcification seen on CAT scan   Sick sinus syndrome (HCC)   Essential hypertension   COPD GOLD III with marked reversibility    PAF (paroxysmal atrial fibrillation) (HCC)   AKI (acute kidney injury) (HCC)   Hyponatremia   Hypercalcemia   Obesity, Class I, BMI 30-34.9  Pneumonia of left lower lobe due to infectious organism: Failed outpatient oral  Levaquin.  We will keep on ceftriaxone/doxycycline, follow-up blood cultures check urine antigen, sputum culture.  Continue bronchodilators, gentle IV hydration, supplemental oxygen, incentive spirometry.  AKI: Baseline creatinine 1.1 in May/23 likely in the setting of poor oral intake volume depletion/hypovolemia, compounded by HCTZ/Lasix . Hold oral diuretics, avoid hypotension, avoid NSAIDs, continue gentle IV hydration and monitor bmp.  Hypovolemic hyponatremia: Suspect volume depletion, hold Lasix HCTZ, keep on gentle fluid IV hydration and repeat  Coronary artery calcification seen on CAT scan Sick sinus syndrome s/p PPM PAF: Pacemaker in place.  No chest pain.  Monitoring in telemetry.  Resume home Tambocor and Xarelto   Essential hypertension: BP soft, will avoid patient's home antihypertensives in the setting of AKI hyponatremia volume depletion.  Monitor BP  COPD; not in exacerbation.  Reports he is intolerant to Trelegy.  Keep on Pulmicort neb, albuterol as needed   Hypercalcemia: Slightly elevated calcium likely from hemoconcentration dehydration.  If remains persistent despite IV fluid hydration will need to start work-up  Elevated bilirubin 1.5 LFTs relatively stable, recheck to ensure resolution  Class I obesity with Body mass index is 30.38 kg/m.  Encourage weight loss PC follow-up  Severity of Illness: The appropriate patient status for this patient is OBSERVATION. Observation status is judged to be reasonable and necessary in order to provide the required intensity of service to ensure the patient's safety. The patient's presenting symptoms, physical exam findings, and initial radiographic and laboratory data in the context of their medical condition is felt to place them at decreased risk for further clinical deterioration. Furthermore, it is anticipated that the patient will be medically stable for discharge from the hospital within 2 midnights of admission.  DVT  prophylaxis: SCDs Start: 05/19/22 1721 rivaroxaban (XARELTO) tablet 20 mg   Code Status:   Code Status: Full Code  Family Communication: Admission, patients condition and plan of care including tests being ordered have been discussed with the patient who indicate understanding and agree with the plan and Code Status.  Consults called:  None  Review of Systems: All systems were reviewed and were negative except as mentioned in HPI above. Negative for fever Negative for chest pain Negative for focal weakness  Past Medical History:  Diagnosis Date   Abdominal pain, epigastric    Allergic rhinitis, cause unspecified    Arthritis    BPH (benign prostatic hyperplasia)    CHF (congestive heart failure) (HCC)    Chronic anticoagulation    xarelto   Coronary artery calcification seen on CAT scan 12/26/2012   Depressive disorder, not elsewhere classified    Diverticulosis of colon (without mention of hemorrhage)    Esophageal reflux    High risk medication use    Hyperlipidemia    Hyperplastic rectal polyp    Hypertension    Lumbago    Pacemaker    Paralyzed hemidiaphragm    LEFT   Persistent atrial fibrillation (HCC)    Sick sinus syndrome ([place]) 05/16/2009   s/p PPM ([employer]) by [name]    Past Surgical History:  Procedure Laterality Date   CYSTOSCOPY WITH RETROGRADE PYELOGRAM, URETEROSCOPY AND STENT PLACEMENT Left 12/30/2021   Procedure: CYSTOSCOPY WITH RETROGRADE PYELOGRAM, URETEROSCOPY;  Surgeon: Remi Haggard, MD;  Location: [facility];  Service: Urology;  Laterality: Left;   ESOPHAGOGASTRODUODENOSCOPY N/A 12/05/2013   Procedure: ESOPHAGOGASTRODUODENOSCOPY (EGD);  Surgeon: [name], MD;  Location: [facility];  Service: Endoscopy;  Laterality: N/A;   ESOPHAGOGASTRODUODENOSCOPY N/A 07/04/2015   Procedure: ESOPHAGOGASTRODUODENOSCOPY (EGD);  Surgeon: [name], MD;  Location: [facility];  Service: Endoscopy;  Laterality: N/A;   EXTRACORPOREAL  SHOCK WAVE LITHOTRIPSY Left 10/13/2021   Procedure: EXTRACORPOREAL SHOCK WAVE LITHOTRIPSY (ESWL);  Surgeon: Lucas Mallow, MD;  Location: [facility];  Service: Urology;  Laterality: Left;  90 MINS   INSERT / REPLACE / REMOVE PACEMAKER  05/2009    [employer] [id number]  (serial number [id number]) dual-chamber pacemaker   LEAD REVISION/REPAIR N/A 10/15/2020   Procedure: LEAD REVISION/REPAIR;  Surgeon: Thompson Grayer, MD;  Location: [facility];  Service: Cardiovascular;  Laterality: N/A;   PACEMAKER PLACEMENT Bilateral    '05   PPM GENERATOR CHANGEOUT N/A 10/15/2020   Procedure: PPM GENERATOR CHANGEOUT;  Surgeon: Thompson Grayer, MD;  Location: [facility];  Service: Cardiovascular;  Laterality: N/A;     reports that he quit smoking about 30 years ago. His smoking use included cigarettes. He has a 10.00 pack-year smoking history. He has quit using smokeless tobacco.  His smokeless tobacco use included chew. He reports that he does not drink alcohol and does not use drugs.  No Known Allergies  Family History  Problem Relation Age of Onset   Diabetes Mother    Hypertension Mother    Hypertension Father    Lung cancer Father    Cancer Father    Asthma Sister    Hypertension Sister    Arthritis Paternal Grandmother    Hypertension Brother    Hypertension Daughter    Hypertension Son      Prior to Admission medications   Medication Sig Start Date End Date Taking? Authorizing Provider  acetaminophen (TYLENOL) 650 MG CR  tablet Take 650 mg by mouth in the morning and at bedtime.   Yes [provider]  albuterol (VENTOLIN HFA) 108 (90 Base) MCG/ACT inhaler Inhale 2 puffs into the lungs every 6 (six) hours as needed for wheezing or shortness of breath. 05/06/22  Yes Olalere, Adewale A, MD  amLODipine (NORVASC) 10 MG tablet Take 1 tablet (10 mg total) by mouth at bedtime. 03/27/22  Yes Early Osmond, MD  atorvastatin (LIPITOR) 80 MG tablet TAKE 1  TABLET BY MOUTH EVERY DAY 01/13/22  Yes Biagio Borg, MD  Cholecalciferol (VITAMIN D3) 50 MCG (2000 UT) TABS Take 2,000 Units by mouth daily.   Yes [provider]  clobetasol cream (TEMOVATE) 0.05 % APPLY TO AFFECTED AREA TWICE A DAY 06/20/21  Yes Biagio Borg, MD  diclofenac Sodium (VOLTAREN) 1 % GEL Apply 2 g topically daily as needed (pain).   Yes [provider]  dorzolamide-timolol (COSOPT) 22.3-6.8 MG/ML ophthalmic solution Place 2 drops into both eyes in the morning and at bedtime. 01/10/20  Yes [provider]  fexofenadine (ALLEGRA) 180 MG tablet TAKE 1 TABLET (180 MG TOTAL) BY MOUTH DAILY. Patient taking differently: Take 180 mg by mouth daily. 12/10/15  Yes Biagio Borg, MD  flecainide (TAMBOCOR) 100 MG tablet TAKE 1 TABLET BY MOUTH TWICE A DAY Patient taking differently: Take 100 mg by mouth 2 (two) times daily. 12/15/21  Yes Shirley Friar, PA-C  Fluticasone-Umeclidin-Vilant (TRELEGY ELLIPTA) 100-62.5-25 MCG/ACT AEPB Inhale 1 puff into the lungs daily. 05/06/22  Yes Olalere, Adewale A, MD  furosemide (LASIX) 20 MG tablet TAKE 1 TABLET IF WEIGHT GAIN IS > 3LBS OVERNIGHT OR >5LBS IN A WEEK NO MORE THAN 2 TABS WEEKLY Patient taking differently: Take 20 mg by mouth 2 (two) times daily. 05/07/22  Yes Biagio Borg, MD  hydrochlorothiazide (HYDRODIURIL) 25 MG tablet Take 1 tablet (25 mg total) by mouth daily. 05/06/22  Yes Olalere, Adewale A, MD  ketoconazole (NIZORAL) 2 % cream Apply 1 application topically daily. 09/01/21  Yes Biagio Borg, MD  ketoconazole (NIZORAL) 2 % shampoo Apply 1 Application topically See admin instructions. Two times weekly as needed for irritation 10/28/21  Yes [provider]  latanoprost (XALATAN) 0.005 % ophthalmic solution Place 1 drop into both eyes at bedtime.    Yes [provider]  meclizine (ANTIVERT) 12.5 MG tablet 1 TAB BY MOUTH EVERY 8 HRS AS NEEDED Patient taking differently: Take 12.5 mg by mouth 3  (three) times daily as needed for dizziness. 03/03/22  Yes Biagio Borg, MD  metoprolol succinate (TOPROL XL) 25 MG 24 hr tablet Take 1 tablet (25 mg total) by mouth at bedtime. 03/27/22  Yes Early Osmond, MD  omeprazole (PRILOSEC) 40 MG capsule TAKE 1 CAPSULE BY MOUTH EVERY DAY AS NEEDED Patient taking differently: Take 40 mg by mouth daily as needed (acid reflux). 12/15/21  Yes Biagio Borg, MD  psyllium (METAMUCIL) 0.52 g capsule Take 0.52 g by mouth in the morning and at bedtime.   Yes [provider]  rivaroxaban (XARELTO) 20 MG TABS tablet TAKE 1 TABLET BY MOUTH DAILY WITH SUPPER. Patient taking differently: Take 20 mg by mouth daily. 09/16/21  Yes Biagio Borg, MD  tamsulosin (FLOMAX) 0.4 MG CAPS capsule TAKE 1 CAPSULE BY MOUTH TWICE A DAY Patient taking differently: Take 0.4 mg by mouth in the morning and at bedtime. 07/09/21  Yes Biagio Borg, MD  telmisartan (MICARDIS) 20 MG tablet Take  1 tablet (20 mg total) by mouth at bedtime. 03/27/22  Yes Early Osmond, MD  traMADol (ULTRAM) 50 MG tablet TAKE 1 TABLET BY MOUTH EVERY 6 HOURS AS NEEDED. FOR PAIN Patient taking differently: Take 50 mg by mouth every 6 (six) hours as needed for severe pain. 04/08/22  Yes Biagio Borg, MD  Fluticasone-Umeclidin-Vilant (TRELEGY ELLIPTA) 100-62.5-25 MCG/ACT AEPB Inhale 1 puff into the lungs daily. Patient not taking: Reported on 05/19/2022 05/06/22   Laurin Coder, MD  levofloxacin (LEVAQUIN) 500 MG tablet Take 1 tablet (500 mg total) by mouth daily for 10 days. 05/14/22 05/24/22  Biagio Borg, MD  oxybutynin (DITROPAN-XL) 10 MG 24 hr tablet TAKE 1 TABLET (10 MG TOTAL) BY MOUTH DAILY. 08/02/15   Biagio Borg, MD    Physical Exam: Vitals:   05/19/22 1130 05/19/22 1315 05/19/22 1537 05/19/22 1632  BP: 109/75 93/69 106/76   Pulse: (!) 54 70 (!) 59   Resp:  19 19   Temp:      SpO2: 94% 100% 95%   Weight:    93.3 kg  Height:    '5\' 9"'$  (1.753 m)    General exam: AAOx3, pleasant, , NAD,  weak appearing. HEENT:Oral mucosa DRY,Ear/Nose WNL grossly, dentition normal. Respiratory system: bilaterally diminished, no wheezing,no use of accessory muscle Cardiovascular system: S1 & S2 +, No JVD,. Gastrointestinal system: Abdomen soft, NT,ND, BS+ Nervous System:Alert, awake, moving extremities and grossly nonfocal Extremities: No edema, distal peripheral pulses palpable.  Skin: No rashes,no icterus. MSK: Normal muscle bulk,tone, power    Labs on Admission: I have personally reviewed following labs and imaging studies  CBC: Recent Labs  Lab 05/13/22 1216 05/18/22 1631  WBC 8.4 10.4  NEUTROABS 5.4 7.9*  HGB 15.4 16.2  HCT 46.5 48.3  MCV 92.0 88.5  PLT 184.0 989   Basic Metabolic Panel: Recent Labs  Lab 05/13/22 1216 05/18/22 1631  NA 136 129*  K 3.5 3.7  CL 94* 88*  CO2 30 26  GLUCOSE 122* 127*  BUN 22 39*  CREATININE 1.51* 2.03*  CALCIUM 10.3 10.9*   GFR: Estimated Creatinine Clearance: 33.3 mL/min (A) (by C-G formula based on SCr of 2.03 mg/dL (H)). Liver Function Tests: Recent Labs  Lab 05/13/22 1216 05/18/22 1631  AST 21 19  ALT 23 19  ALKPHOS 84 59  BILITOT 1.0 1.5*  PROT 8.0 8.6*  ALBUMIN 4.7 5.0   No results for input(s): "LIPASE", "AMYLASE" in the last 168 hours. No results for input(s): "AMMONIA" in the last 168 hours. Coagulation Profile: No results for input(s): "INR", "PROTIME" in the last 168 hours. Cardiac Enzymes: No results for input(s): "CKTOTAL", "CKMB", "CKMBINDEX", "TROPONINI" in the last 168 hours. BNP (last 3 results) Recent Labs    05/13/22 1216  PROBNP 19.0   HbA1C: No results for input(s): "HGBA1C" in the last 72 hours. CBG: No results for input(s): "GLUCAP" in the last 168 hours. Lipid Profile: No results for input(s): "CHOL", "HDL", "LDLCALC", "TRIG", "CHOLHDL", "LDLDIRECT" in the last 72 hours. Thyroid Function Tests: No results for input(s): "TSH", "T4TOTAL", "FREET4", "T3FREE", "THYROIDAB" in the last 72  hours. Anemia Panel: No results for input(s): "VITAMINB12", "FOLATE", "FERRITIN", "TIBC", "IRON", "RETICCTPCT" in the last 72 hours. Urine analysis:    Component Value Date/Time   COLORURINE YELLOW 05/19/2022 0725   APPEARANCEUR CLEAR 05/19/2022 0725   LABSPEC 1.014 05/19/2022 0725   PHURINE 6.0 05/19/2022 0725   GLUCOSEU NEGATIVE 05/19/2022 0725   GLUCOSEU NEGATIVE 11/24/2021 0920  HGBUR NEGATIVE 05/19/2022 0725   BILIRUBINUR NEGATIVE 05/19/2022 0725   KETONESUR NEGATIVE 05/19/2022 0725   PROTEINUR NEGATIVE 05/19/2022 0725   UROBILINOGEN 1.0 11/24/2021 0920   NITRITE NEGATIVE 05/19/2022 0725   LEUKOCYTESUR NEGATIVE 05/19/2022 0725    Radiological Exams on Admission: CT Chest Wo Contrast  Result Date: 05/18/2022 CLINICAL DATA:  Generalized weakness and shortness of breath. EXAM: CT CHEST WITHOUT CONTRAST TECHNIQUE: Multidetector CT imaging of the chest was performed following the standard protocol without IV contrast. RADIATION DOSE REDUCTION: This exam was performed according to the departmental dose-optimization program which includes automated exposure control, adjustment of the mA and/or kV according to patient size and/or use of iterative reconstruction technique. COMPARISON:  September 15, 2021 FINDINGS: Cardiovascular: There is a multi lead AICD. There is moderate severity calcification of the aortic arch and descending thoracic aorta, without evidence of aortic aneurysm. Normal heart size with marked severity coronary artery calcification. No pericardial effusion. Mediastinum/Nodes: No enlarged mediastinal or axillary lymph nodes. Thyroid gland, trachea, and esophagus demonstrate no significant findings. Lungs/Pleura: There is mild, stable lingular scarring and/or atelectasis. Moderate severity consolidation is seen within the left lower lobe. This is increased in severity when compared to the prior study. Chronic left lower lobe volume loss is noted. There is no evidence of a pleural  effusion or pneumothorax. Upper Abdomen: Noninflamed diverticula are seen within the proximal descending colon. Musculoskeletal: No chest wall mass or suspicious bone lesions identified. IMPRESSION: 1. Moderate severity left lower lobe consolidation, which is likely a result of atelectasis and/or infiltrate. Follow-up to resolution is recommended, as an underlying neoplastic process cannot be excluded. 2. Chronic left lower lobe volume loss. 3. Marked severity coronary artery calcification. 4. Colonic diverticulosis. Aortic Atherosclerosis (ICD10-I70.0). Electronically Signed   By: Virgina Norfolk M.D.   On: 05/18/2022 22:32   DG Chest 2 View  Result Date: 05/18/2022 CLINICAL DATA:  Generalized weakness and shortness of breath with exertion. Patient diagnosed with pneumonia on 06/28 PCP and sent home with antibiotics. EXAM: CHEST - 2 VIEW COMPARISON:  Chest radiograph 05/13/2022. FINDINGS: Left chest wall pacemaker with leads overlying the right atrium, right ventricle, and coronary vein. Stable eventration of the left hemidiaphragm. Heart size is difficult to assess due to elevation of the right hemidiaphragm but appears stable. Aortic calcifications. Minimal patchy airspace opacity in the medial aspect of the left lower lobe, obscuring the border of the thoracic aorta. No pleural effusion or pneumothorax. IMPRESSION: Minimal patchy airspace opacity in the medial left lower lobe, not significantly changed compared to 05/13/2022. No new focal consolidation. No pleural effusion. Electronically Signed   By: Ileana Roup M.D.   On: 05/18/2022 17:27      Antonieta Pert MD [facility]  If 7PM-7AM, please contact night-coverage [email or web address]  05/19/2022, 5:30 PM

## 2022-05-19 NOTE — ED Provider Notes (Signed)
  Physical Exam  BP 116/80   Pulse (!) 58   Temp 98.1 F (36.7 C)   Resp 15   Ht 1.753 m ('5\' 9"'$ )   Wt 93.4 kg   SpO2 90%   BMI 30.42 kg/m   Physical Exam  Procedures  Procedures  ED Course / MDM    Medical Decision Making Amount and/or Complexity of Data Reviewed Labs: ordered. Radiology: ordered.  Risk Prescription drug management. Decision regarding hospitalization.   81 yo male with diagnosis of cap on 6/29 on levaquin presented loc with dyspnea.  Patient found to have AKI and lll infiltrate.   No leukocytosis or fever. New oxygen requirement CXR and ct with lll infiltrate. Rocephin and doxy started for cap.       Pattricia Boss, MD 05/19/22 [id number]

## 2022-05-19 NOTE — Hospital Course (Addendum)
21yom w/ history of chronic diastolic CHF, HLD, CAD-(seen on coronary CTA), hypertension, paroxysmal atrial fibrillation, sick sinus syndrome  s/p PPM presented to ED with complaints of progressively worsening generalized weakness, shortness of breath, poor appetite.  Recently seen by PCP on 6/29, diagnosed with pneumonia-had labs that showed creatinine 1.5, D-dimer less than 0.19 WBC count 8.4 BNP 19, and prescribed Levaquin, but symptoms have been worsening despite being on antibiotics. In the ED vitals stable afebrile initially blood pressure in 90s, labs showed normal lactic acid, troponin 6-BMP with hyponatremia at 129, AKI with creatinine 2.0, WBC 10.4 UA unremarkable, blood culture sent,CT chest w/o contrast-moderate severity left lower lobe consolidation with recommendation for repeat imaging after treatment of suspected pneumonia to ensure there is no evidence of underlying malignancy.  Started on IV ceftriaxone doxycycline and admission requested for further management. Patient arrived to [facility] 7/4 5 PM Currently heart rate is stable, on room air. He denies any nausea, vomiting,headache, focal weakness, numbness tingling, speech difficulties, focal weakness, blackish stool rectal bleeding

## 2022-05-20 DIAGNOSIS — Z87891 Personal history of nicotine dependence: Secondary | ICD-10-CM | POA: Diagnosis not present

## 2022-05-20 DIAGNOSIS — I5032 Chronic diastolic (congestive) heart failure: Secondary | ICD-10-CM | POA: Diagnosis present

## 2022-05-20 DIAGNOSIS — Z8249 Family history of ischemic heart disease and other diseases of the circulatory system: Secondary | ICD-10-CM | POA: Diagnosis not present

## 2022-05-20 DIAGNOSIS — E861 Hypovolemia: Secondary | ICD-10-CM | POA: Diagnosis present

## 2022-05-20 DIAGNOSIS — I4819 Other persistent atrial fibrillation: Secondary | ICD-10-CM | POA: Diagnosis present

## 2022-05-20 DIAGNOSIS — J189 Pneumonia, unspecified organism: Secondary | ICD-10-CM | POA: Diagnosis present

## 2022-05-20 DIAGNOSIS — I10 Essential (primary) hypertension: Secondary | ICD-10-CM | POA: Diagnosis not present

## 2022-05-20 DIAGNOSIS — I2510 Atherosclerotic heart disease of native coronary artery without angina pectoris: Secondary | ICD-10-CM | POA: Diagnosis present

## 2022-05-20 DIAGNOSIS — R0602 Shortness of breath: Secondary | ICD-10-CM | POA: Diagnosis present

## 2022-05-20 DIAGNOSIS — I495 Sick sinus syndrome: Secondary | ICD-10-CM | POA: Diagnosis present

## 2022-05-20 DIAGNOSIS — E876 Hypokalemia: Secondary | ICD-10-CM

## 2022-05-20 DIAGNOSIS — E871 Hypo-osmolality and hyponatremia: Secondary | ICD-10-CM | POA: Diagnosis present

## 2022-05-20 DIAGNOSIS — E785 Hyperlipidemia, unspecified: Secondary | ICD-10-CM | POA: Diagnosis present

## 2022-05-20 DIAGNOSIS — J440 Chronic obstructive pulmonary disease with acute lower respiratory infection: Secondary | ICD-10-CM | POA: Diagnosis present

## 2022-05-20 DIAGNOSIS — Z801 Family history of malignant neoplasm of trachea, bronchus and lung: Secondary | ICD-10-CM | POA: Diagnosis not present

## 2022-05-20 DIAGNOSIS — D72828 Other elevated white blood cell count: Secondary | ICD-10-CM | POA: Diagnosis present

## 2022-05-20 DIAGNOSIS — E878 Other disorders of electrolyte and fluid balance, not elsewhere classified: Secondary | ICD-10-CM | POA: Diagnosis present

## 2022-05-20 DIAGNOSIS — Z79899 Other long term (current) drug therapy: Secondary | ICD-10-CM | POA: Diagnosis not present

## 2022-05-20 DIAGNOSIS — Z950 Presence of cardiac pacemaker: Secondary | ICD-10-CM | POA: Diagnosis not present

## 2022-05-20 DIAGNOSIS — I480 Paroxysmal atrial fibrillation: Secondary | ICD-10-CM | POA: Diagnosis not present

## 2022-05-20 DIAGNOSIS — Z825 Family history of asthma and other chronic lower respiratory diseases: Secondary | ICD-10-CM | POA: Diagnosis not present

## 2022-05-20 DIAGNOSIS — E860 Dehydration: Secondary | ICD-10-CM | POA: Diagnosis present

## 2022-05-20 DIAGNOSIS — Z7951 Long term (current) use of inhaled steroids: Secondary | ICD-10-CM | POA: Diagnosis not present

## 2022-05-20 DIAGNOSIS — N179 Acute kidney failure, unspecified: Secondary | ICD-10-CM | POA: Diagnosis present

## 2022-05-20 DIAGNOSIS — Z7901 Long term (current) use of anticoagulants: Secondary | ICD-10-CM | POA: Diagnosis not present

## 2022-05-20 DIAGNOSIS — J449 Chronic obstructive pulmonary disease, unspecified: Secondary | ICD-10-CM | POA: Diagnosis not present

## 2022-05-20 DIAGNOSIS — N400 Enlarged prostate without lower urinary tract symptoms: Secondary | ICD-10-CM | POA: Diagnosis present

## 2022-05-20 DIAGNOSIS — I110 Hypertensive heart disease with heart failure: Secondary | ICD-10-CM | POA: Diagnosis present

## 2022-05-20 DIAGNOSIS — E669 Obesity, unspecified: Secondary | ICD-10-CM | POA: Diagnosis not present

## 2022-05-20 LAB — COMPREHENSIVE METABOLIC PANEL
ALT: 24 U/L (ref 0–44)
AST: 32 U/L (ref 15–41)
Albumin: 4.1 g/dL (ref 3.5–5.0)
Alkaline Phosphatase: 64 U/L (ref 38–126)
Anion gap: 11 (ref 5–15)
BUN: 30 mg/dL — ABNORMAL HIGH (ref 8–23)
CO2: 24 mmol/L (ref 22–32)
Calcium: 9.6 mg/dL (ref 8.9–10.3)
Chloride: 101 mmol/L (ref 98–111)
Creatinine, Ser: 1.24 mg/dL (ref 0.61–1.24)
GFR, Estimated: 59 mL/min — ABNORMAL LOW (ref >60)
Glucose, Bld: 92 mg/dL (ref 70–99)
Potassium: 3.3 mmol/L — ABNORMAL LOW (ref 3.5–5.1)
Sodium: 136 mmol/L (ref 135–145)
Total Bilirubin: 1.2 mg/dL (ref 0.3–1.2)
Total Protein: 7.5 g/dL (ref 6.5–8.1)

## 2022-05-20 LAB — CBC
HCT: 48.6 % (ref 39.0–52.0)
Hemoglobin: 16.1 g/dL (ref 13.0–17.0)
MCH: 30.4 pg (ref 26.0–34.0)
MCHC: 33.1 g/dL (ref 30.0–36.0)
MCV: 91.7 fL (ref 80.0–100.0)
Platelets: 175 10*3/uL (ref 150–400)
RBC: 5.3 MIL/uL (ref 4.22–5.81)
RDW: 12.8 % (ref 11.5–15.5)
WBC: 12.2 10*3/uL — ABNORMAL HIGH (ref 4.0–10.5)
nRBC: 0 % (ref 0.0–0.2)

## 2022-05-20 MED ORDER — POTASSIUM CHLORIDE CRYS ER 20 MEQ PO TBCR
20.0000 meq | EXTENDED_RELEASE_TABLET | Freq: Once | ORAL | Status: AC
  Administered 2022-05-20: 20 meq via ORAL
  Filled 2022-05-20: qty 1

## 2022-05-20 MED ORDER — DORZOLAMIDE HCL-TIMOLOL MAL 2-0.5 % OP SOLN
1.0000 [drp] | Freq: Two times a day (BID) | OPHTHALMIC | Status: DC
Start: 2022-05-20 — End: 2022-05-22
  Administered 2022-05-20 – 2022-05-22 (×4): 1 [drp] via OPHTHALMIC

## 2022-05-20 MED ORDER — RIVAROXABAN 20 MG PO TABS
20.0000 mg | ORAL_TABLET | Freq: Every day | ORAL | Status: DC
  Administered 2022-05-20 – 2022-05-21 (×2): 20 mg via ORAL
  Filled 2022-05-20 (×2): qty 1

## 2022-05-20 NOTE — Progress Notes (Signed)
PROGRESS NOTE [name]  RXV:[id number] DOB: 21-Mar-1941 DOA: 05/18/2022 PCP: Biagio Borg, MD   Brief Narrative/Hospital Course: 49yom w/ history of chronic diastolic CHF, HLD, CAD-(seen on coronary CTA), hypertension, paroxysmal atrial fibrillation, sick sinus syndrome  s/p PPM presented to ED with complaints of progressively worsening generalized weakness, shortness of breath, poor appetite.  Recently seen by PCP on 6/29, diagnosed with pneumonia-had labs that showed creatinine 1.5, D-dimer less than 0.19 WBC count 8.4 BNP 19, and prescribed Levaquin, but symptoms have been worsening despite being on antibiotics. In the ED vitals stable afebrile initially blood pressure in 90s, labs showed normal lactic acid, troponin 6-BMP with hyponatremia at 129, AKI with creatinine 2.0, WBC 10.4 UA unremarkable, blood culture sent,CT chest w/o contrast-moderate severity left lower lobe consolidation with recommendation for repeat imaging after treatment of suspected pneumonia to ensure there is no evidence of underlying malignancy.  Started on IV ceftriaxone doxycycline and admission requested for further management. Patient [facility] 7/4 5 PM Currently heart rate is stable, on room air. He denies any nausea, vomiting,headache, focal weakness, numbness tingling, speech difficulties, focal weakness, blackish stool rectal bleeding      Subjective:   Seen examined  On RA. Feels much better this am- 50% better Overnight afebrile, on room air Appetite coming back  Assessment and Plan: Principal Problem:   Pneumonia of left lower lobe due to infectious organism Active Problems:   Coronary artery calcification seen on CAT scan   Sick sinus syndrome (HCC)   Essential hypertension   COPD GOLD III with marked reversibility    PAF (paroxysmal atrial fibrillation) (HCC)   AKI (acute kidney injury) (HCC)   Hyponatremia   Hypercalcemia   Obesity, Class I, BMI 30-34.9   Hypokalemia    Pneumonia of left lower lobe due to infectious organism: Failed outpatient oral Levaquin. Remains afebrile overall, on room air, will continue with ceftriaxone/doxycycline, follow-up blood cultures urine antigen, sputum culture.  Continue bronchodilators, gentle IV hydration. He needs fu imaging in 3wks on dc to rule out neoplasm. He sees Dr Ander Slade   AKI: Baseline creatinine 1.1 in May/23 likely in the setting of poor oral intake volume depletion/hypovolemia, compounded by HCTZ/Lasix-creatinine improved with IV fluid hydration, encourage p.o. intake continue gentle hydration, at risk of AKI due to dehydration and ongoing pneumonia-continue to hold HCTZ and Lasix. Recent Labs  Lab 05/13/22 1216 05/18/22 1631 05/20/22 0531  BUN 22 39* 30*  CREATININE 1.51* 2.03* 1.24    Hypovolemic hyponatremia: Suspect due to volume depletion dehydration, improved with IV fluids, continue holding Lasix HCTZ, cont gentle ivf  Hypokalemia: Repleting   Coronary artery calcification seen on CAT scan Sick sinus syndrome s/p PPM PAF: Pacemaker in place.  Denies chest pain, continue home Tambocor and Xarelto    Essential hypertension: BP well controlled, will continue holding home antihypertensives-avoid hypotension due to AKI dehydration.    COPD; not in exacerbation.intolerant to Trelegy.  Keep on Pulmicort neb, albuterol as needed    Hypercalcemia: Slightly elevated calcium likely from hemoconcentration dehydration-calcium has improved to 9.6 with IV fluids,, albumin is normal 4.1   Elevated bilirubin 1.5 resolved.     Class I obesity ruled out as current Body mass index is 28.62 kg/m.    DVT prophylaxis: SCDs Start: 05/19/22 1721 Code Status:   Code Status: Full Code Family Communication: plan of care discussed with patient at bedside. Patient status is: observation on admit- but continues to be at risk for [facility]  pneumonia complication and needs to be on ivf, iv antibiotics at least another 24 hrs  due to moderately severe pneumonia Level of care: Telemetry   Dispo: The patient is from: home            Anticipated disposition: home in 24 hrs or so  Mobility Assessment (last 72 hours)     Mobility Assessment     Row Name 05/19/22 1600           Does patient have an order for bedrest or is patient medically unstable No - Continue assessment       What is the highest level of mobility based on the progressive mobility assessment? Level 5 (Walks with assist in room/hall) - Balance while stepping forward/back and can walk in room with assist - Complete                 Objective: Vitals last 24 hrs: Vitals:   05/20/22 0050 05/20/22 0450 05/20/22 0500 05/20/22 0818  BP: 113/81 124/82    Pulse: 61 62    Resp: '16 18  20  '$ Temp: 98.7 F (37.1 C) 98.5 F (36.9 C)    TempSrc: Oral Oral    SpO2: 100% 99%  99%  Weight:   87.9 kg   Height:       Weight change: -0.141 kg  Physical Examination:  General exam: alert awake,older than stated age, weak appearing. HEENT:Oral mucosa dry, Ear/Nose WNL grossly, dentition normal. Respiratory system: bilaterally diminished BS, no use of accessory muscle Cardiovascular system: S1 & S2 +, No JVD. Gastrointestinal system: Abdomen soft,NT,ND, BS+ Nervous System:Alert, awake, moving extremities and grossly nonfocal Extremities: LE edema neg,distal peripheral pulses palpable.  Skin: No rashes,no icterus. MSK: Normal muscle bulk,tone, power  Medications reviewed:  Scheduled Meds:  atorvastatin  80 mg Oral q1800   budesonide (PULMICORT) nebulizer solution  0.25 mg Nebulization BID   cholecalciferol  2,000 Units Oral Daily   dorzolamide-timolol  2 drop Both Eyes BID   doxycycline  100 mg Oral Q12H   flecainide  100 mg Oral BID   latanoprost  1 drop Both Eyes QHS   pantoprazole  40 mg Oral Daily   potassium chloride  20 mEq Oral Once   rivaroxaban  15 mg Oral Q supper   Continuous Infusions:  sodium chloride 75 mL/hr at 05/20/22  0715   cefTRIAXone (ROCEPHIN)  IV 2 g (05/19/22 1953)      Diet Order             Diet Heart Room service appropriate? Yes; Fluid consistency: Thin  Diet effective now                            Intake/Output Summary (Last 24 hours) at 05/20/2022 0844 Last data filed at 05/20/2022 0050 Gross per 24 hour  Intake 1119.56 ml  Output 400 ml  Net 719.56 ml   Net IO Since Admission: 1,189.56 mL [05/20/22 0844]  Wt Readings from Last 3 Encounters:  05/20/22 87.9 kg  05/13/22 89.8 kg  05/06/22 93.7 kg     Unresulted Labs (From admission, onward)     Start     Ordered   05/19/22 1721  Expectorated Sputum Assessment w Gram Stain, Rflx to Resp Cult  (COPD / Pneumonia / Cellulitis / Lower Extremity Wound)  Once,   R        05/19/22 1721   05/19/22 1721  Legionella Pneumophila Serogp 1  Ur Ag  (COPD / Pneumonia / Cellulitis / Lower Extremity Wound)  Once,   R        05/19/22 1721   05/18/22 2211  Blood culture (routine x 2)  BLOOD CULTURE X 2,   R      05/18/22 2210          Data Reviewed: I have personally reviewed following labs and imaging studies CBC: Recent Labs  Lab 05/13/22 1216 05/18/22 1631 05/20/22 0531  WBC 8.4 10.4 12.2*  NEUTROABS 5.4 7.9*  --   HGB 15.4 16.2 16.1  HCT 46.5 48.3 48.6  MCV 92.0 88.5 91.7  PLT 184.0 202 283   Basic Metabolic Panel: Recent Labs  Lab 05/13/22 1216 05/18/22 1631 05/20/22 0531  NA 136 129* 136  K 3.5 3.7 3.3*  CL 94* 88* 101  CO2 '30 26 24  '$ GLUCOSE 122* 127* 92  BUN 22 39* 30*  CREATININE 1.51* 2.03* 1.24  CALCIUM 10.3 10.9* 9.6   GFR: Estimated Creatinine Clearance: 53 mL/min (by C-G formula based on SCr of 1.24 mg/dL). Liver Function Tests: Recent Labs  Lab 05/13/22 1216 05/18/22 1631 05/20/22 0531  AST 21 19 32  ALT '23 19 24  '$ ALKPHOS 84 59 64  BILITOT 1.0 1.5* 1.2  PROT 8.0 8.6* 7.5  ALBUMIN 4.7 5.0 4.1   No results for input(s): "LIPASE", "AMYLASE" in the last 168 hours. No results for input(s):  "AMMONIA" in the last 168 hours. Coagulation Profile: No results for input(s): "INR", "PROTIME" in the last 168 hours. BNP (last 3 results) Recent Labs    05/13/22 1216  PROBNP 19.0   HbA1C: No results for input(s): "HGBA1C" in the last 72 hours. CBG: No results for input(s): "GLUCAP" in the last 168 hours. Lipid Profile: No results for input(s): "CHOL", "HDL", "LDLCALC", "TRIG", "CHOLHDL", "LDLDIRECT" in the last 72 hours. Thyroid Function Tests: No results for input(s): "TSH", "T4TOTAL", "FREET4", "T3FREE", "THYROIDAB" in the last 72 hours. Sepsis Labs: Recent Labs  Lab 05/18/22 1944  LATICACIDVEN 1.7    Recent Results (from the past 240 hour(s))  Respiratory (~20 pathogens) panel by PCR     Status: None   Collection Time: 05/19/22  6:14 PM   Specimen: Nasopharyngeal Swab; Respiratory  Result Value Ref Range Status   Adenovirus NOT DETECTED NOT DETECTED Final   Coronavirus 229E NOT DETECTED NOT DETECTED Final    Comment: (NOTE) The Coronavirus on the Respiratory Panel, DOES NOT test for the novel  Coronavirus (2019 nCoV)    Coronavirus HKU1 NOT DETECTED NOT DETECTED Final   Coronavirus NL63 NOT DETECTED NOT DETECTED Final   Coronavirus OC43 NOT DETECTED NOT DETECTED Final   Metapneumovirus NOT DETECTED NOT DETECTED Final   Rhinovirus / Enterovirus NOT DETECTED NOT DETECTED Final   Influenza A NOT DETECTED NOT DETECTED Final   Influenza B NOT DETECTED NOT DETECTED Final   Parainfluenza Virus 1 NOT DETECTED NOT DETECTED Final   Parainfluenza Virus 2 NOT DETECTED NOT DETECTED Final   Parainfluenza Virus 3 NOT DETECTED NOT DETECTED Final   Parainfluenza Virus 4 NOT DETECTED NOT DETECTED Final   Respiratory Syncytial Virus NOT DETECTED NOT DETECTED Final   Bordetella pertussis NOT DETECTED NOT DETECTED Final   Bordetella Parapertussis NOT DETECTED NOT DETECTED Final   Chlamydophila pneumoniae NOT DETECTED NOT DETECTED Final   Mycoplasma pneumoniae NOT DETECTED NOT  DETECTED Final    Comment: Performed at [facility], [address], [place], [place] [address]  Antimicrobials: Anti-infectives (From admission, onward)    Start     Dose/Rate Route Frequency Ordered Stop   05/19/22 1815  cefTRIAXone (ROCEPHIN) 2 g in sodium chloride 0.9 % 100 mL IVPB        2 g 200 mL/hr over 30 Minutes Intravenous Every 24 hours 05/19/22 1721 05/24/22 1759   05/19/22 0745  cefTRIAXone (ROCEPHIN) 1 g in sodium chloride 0.9 % 100 mL IVPB        1 g 200 mL/hr over 30 Minutes Intravenous  Once 05/19/22 0736 05/19/22 1029   05/19/22 0745  doxycycline (VIBRA-TABS) tablet 100 mg        100 mg Oral Every 12 hours 05/19/22 0736        Culture/Microbiology    Component Value Date/Time   SDES URINE, RANDOM 07/01/2015 1617   SPECREQUEST NONE 07/01/2015 1617   CULT 2,000 COLONIES/mL INSIGNIFICANT GROWTH 07/01/2015 1617   REPTSTATUS 07/03/2015 FINAL 07/01/2015 1617    Other culture-see note  Radiology Studies: CT Chest Wo Contrast  Result Date: 05/18/2022 CLINICAL DATA:  Generalized weakness and shortness of breath. EXAM: CT CHEST WITHOUT CONTRAST TECHNIQUE: Multidetector CT imaging of the chest was performed following the standard protocol without IV contrast. RADIATION DOSE REDUCTION: This exam was performed according to the departmental dose-optimization program which includes automated exposure control, adjustment of the mA and/or kV according to patient size and/or use of iterative reconstruction technique. COMPARISON:  September 15, 2021 FINDINGS: Cardiovascular: There is a multi lead AICD. There is moderate severity calcification of the aortic arch and descending thoracic aorta, without evidence of aortic aneurysm. Normal heart size with marked severity coronary artery calcification. No pericardial effusion. Mediastinum/Nodes: No enlarged mediastinal or axillary lymph nodes. Thyroid gland, trachea, and esophagus demonstrate no significant findings. Lungs/Pleura: There  is mild, stable lingular scarring and/or atelectasis. Moderate severity consolidation is seen within the left lower lobe. This is increased in severity when compared to the prior study. Chronic left lower lobe volume loss is noted. There is no evidence of a pleural effusion or pneumothorax. Upper Abdomen: Noninflamed diverticula are seen within the proximal descending colon. Musculoskeletal: No chest wall mass or suspicious bone lesions identified. IMPRESSION: 1. Moderate severity left lower lobe consolidation, which is likely a result of atelectasis and/or infiltrate. Follow-up to resolution is recommended, as an underlying neoplastic process cannot be excluded. 2. Chronic left lower lobe volume loss. 3. Marked severity coronary artery calcification. 4. Colonic diverticulosis. Aortic Atherosclerosis (ICD10-I70.0). Electronically Signed   By: Virgina Norfolk M.D.   On: 05/18/2022 22:32   DG Chest 2 View  Result Date: 05/18/2022 CLINICAL DATA:  Generalized weakness and shortness of breath with exertion. Patient diagnosed with pneumonia on 06/28 PCP and sent home with antibiotics. EXAM: CHEST - 2 VIEW COMPARISON:  Chest radiograph 05/13/2022. FINDINGS: Left chest wall pacemaker with leads overlying the right atrium, right ventricle, and coronary vein. Stable eventration of the left hemidiaphragm. Heart size is difficult to assess due to elevation of the right hemidiaphragm but appears stable. Aortic calcifications. Minimal patchy airspace opacity in the medial aspect of the left lower lobe, obscuring the border of the thoracic aorta. No pleural effusion or pneumothorax. IMPRESSION: Minimal patchy airspace opacity in the medial left lower lobe, not significantly changed compared to 05/13/2022. No new focal consolidation. No pleural effusion. Electronically Signed   By: Ileana Roup M.D.   On: 05/18/2022 17:27     LOS: 0 days   Antonieta Pert, MD [facility]  05/20/2022, 8:44  AM

## 2022-05-20 NOTE — Progress Notes (Signed)
  Transition of Care [facility]) Screening Note   Patient Details  Name: [name] Date of Birth: 07-May-1941   Transition of Care [facility]) CM/SW Contact:    Vassie Moselle, LCSW Phone Number: 05/20/2022, 11:48 AM    Transition of Care Department [facility]) has reviewed patient and no TOC needs have been identified at this time. We will continue to monitor patient advancement through interdisciplinary progression rounds. If new patient transition needs arise, please place a TOC consult.

## 2022-05-21 DIAGNOSIS — I2510 Atherosclerotic heart disease of native coronary artery without angina pectoris: Secondary | ICD-10-CM

## 2022-05-21 DIAGNOSIS — I10 Essential (primary) hypertension: Secondary | ICD-10-CM

## 2022-05-21 DIAGNOSIS — N179 Acute kidney failure, unspecified: Secondary | ICD-10-CM

## 2022-05-21 DIAGNOSIS — I480 Paroxysmal atrial fibrillation: Secondary | ICD-10-CM

## 2022-05-21 DIAGNOSIS — E871 Hypo-osmolality and hyponatremia: Secondary | ICD-10-CM

## 2022-05-21 DIAGNOSIS — J189 Pneumonia, unspecified organism: Secondary | ICD-10-CM | POA: Diagnosis not present

## 2022-05-21 DIAGNOSIS — J449 Chronic obstructive pulmonary disease, unspecified: Secondary | ICD-10-CM

## 2022-05-21 DIAGNOSIS — E876 Hypokalemia: Secondary | ICD-10-CM

## 2022-05-21 DIAGNOSIS — E669 Obesity, unspecified: Secondary | ICD-10-CM

## 2022-05-21 LAB — BASIC METABOLIC PANEL
Anion gap: 9 (ref 5–15)
BUN: 23 mg/dL (ref 8–23)
CO2: 26 mmol/L (ref 22–32)
Calcium: 9.2 mg/dL (ref 8.9–10.3)
Chloride: 105 mmol/L (ref 98–111)
Creatinine, Ser: 1.13 mg/dL (ref 0.61–1.24)
GFR, Estimated: >60 mL/min (ref >60)
Glucose, Bld: 96 mg/dL (ref 70–99)
Potassium: 3.5 mmol/L (ref 3.5–5.1)
Sodium: 140 mmol/L (ref 135–145)

## 2022-05-21 LAB — CBC
HCT: 44.6 % (ref 39.0–52.0)
Hemoglobin: 14.6 g/dL (ref 13.0–17.0)
MCH: 30.7 pg (ref 26.0–34.0)
MCHC: 32.7 g/dL (ref 30.0–36.0)
MCV: 93.9 fL (ref 80.0–100.0)
Platelets: 163 10*3/uL (ref 150–400)
RBC: 4.75 MIL/uL (ref 4.22–5.81)
RDW: 13.1 % (ref 11.5–15.5)
WBC: 9.6 10*3/uL (ref 4.0–10.5)
nRBC: 0 % (ref 0.0–0.2)

## 2022-05-21 LAB — LEGIONELLA PNEUMOPHILA SEROGP 1 UR AG: L. pneumophila Serogp 1 Ur Ag: NEGATIVE

## 2022-05-21 MED ORDER — MUSCLE RUB 10-15 % EX CREA
TOPICAL_CREAM | CUTANEOUS | Status: DC | PRN
  Filled 2022-05-21: qty 85

## 2022-05-21 NOTE — Progress Notes (Signed)
PROGRESS NOTE    [name]  YIR:[id number] DOB: 1941-03-18 DOA: 05/18/2022 PCP: Biagio Borg, MD   Brief Narrative:  75yom w/ history of chronic diastolic CHF, HLD, CAD-(seen on coronary CTA), hypertension, paroxysmal atrial fibrillation, sick sinus syndrome  s/p PPM presented to ED with complaints of progressively worsening generalized weakness, shortness of breath, poor appetite.  Recently seen by PCP on 6/29, diagnosed with pneumonia-had labs that showed creatinine 1.5, D-dimer less than 0.19 WBC count 8.4 BNP 19, and prescribed Levaquin, but symptoms have been worsening despite being on antibiotics. In the ED vitals stable afebrile initially blood pressure in 90s, labs showed normal lactic acid, troponin 6-BMP with hyponatremia at 129, AKI with creatinine 2.0, WBC 10.4 UA unremarkable, blood culture sent,CT chest w/o contrast-moderate severity left lower lobe consolidation with recommendation for repeat imaging after treatment of suspected pneumonia to ensure there is no evidence of underlying malignancy.  Started on IV ceftriaxone doxycycline and admission requested for further management. Patient arrived to [facility] 7/4 5 PM Currently heart rate is stable, on room air. He denies any nausea, vomiting,headache, focal weakness, numbness tingling, speech difficulties, focal weakness, blackish stool rectal bleeding    Assessment & Plan:   Principal Problem:   Pneumonia of left lower lobe due to infectious organism Active Problems:   Coronary artery calcification seen on CAT scan   Sick sinus syndrome (HCC)   Essential hypertension   COPD GOLD III with marked reversibility    PAF (paroxysmal atrial fibrillation) (HCC)   AKI (acute kidney injury) (HCC)   Hyponatremia   Hypercalcemia   Obesity, Class I, BMI 30-34.9   Hypokalemia   Pneumonia   Pneumonia of left lower lobe due to infectious organism: Failed outpatient oral Levaquin. Remains afebrile overall, on room air, will  continue with ceftriaxone/doxycycline, NEG BLOOD cultures NEG urine antigen, NEG RESP PANEL  Continue bronchodilators, gentle IV hydration.   He needs fu imaging in 3wks on dc to rule out neoplasm. He sees Dr Ander Slade   AKI: Baseline creatinine 1.1 in May/23 likely in the setting of poor oral intake volume depletion/hypovolemia, compounded by HCTZ/Lasix-creatinine improved with IV fluid hydration, encourage p.o. intake continue gentle hydration, at risk of AKI due to dehydration and ongoing pneumonia-continue to hold HCTZ and Lasix. FOR ONE DAY  Hypovolemic hyponatremia: Suspect due to volume depletion dehydration, improved with IV fluids, continue holding Lasix HCTZ, cont gentle ivf   Hypokalemia: Repleting   Coronary artery calcification seen on CAT scan Sick sinus syndrome s/p PPM PAF: Pacemaker in place.  Denies chest pain, continue home Tambocor and Xarelto    Essential hypertension: BP well controlled, will continue holding home antihypertensives-avoid hypotension due to AKI dehydration.    COPD; not in exacerbation.intolerant to Trelegy.  Keep on Pulmicort neb, albuterol as needed    Hypercalcemia: Slightly elevated calcium likely from hemoconcentration dehydration-calcium has improved to 9.6 with IV fluids,, albumin is normal 4.1   Elevated bilirubin 1.5 resolved.     Class I obesity ruled out as current Body mass index is 28.62 kg/m.   DVT prophylaxis: SCD/Compression stockings  Code Status: FULL    Code Status Orders  (From admission, onward)           Start     Ordered   05/19/22 1721  Full code  Continuous        05/19/22 1721           Code Status History     Date Active  Date Inactive Code Status Order ID Comments User Context   10/15/2020 1426 10/15/2020 2209 Full Code [id number]  Thompson Grayer, MD Inpatient   07/01/2015 0148 07/04/2015 1849 Full Code [id number]  Ivor Costa, MD Inpatient   05/06/2013 1143 05/08/2013 2318 Full Code [id number]  Mendel Corning,  MD Inpatient      Advance Directive Documentation    [place] Most Recent Value  Type of Advance Directive Living will  Pre-existing out of facility DNR order (yellow form or pink MOST form) --  "MOST" Form in Place? --      Family Communication: Discussed with son by phone and daughter at bedside Disposition Plan:   [place] 1 DFAY, REQUIRES AN ADDITIONAL DAY OF IV ABX Consults called: None Admission status: Inpatient   Consultants:  None  Procedures:  CT Chest Wo Contrast  Result Date: 05/18/2022 CLINICAL DATA:  Generalized weakness and shortness of breath. EXAM: CT CHEST WITHOUT CONTRAST TECHNIQUE: Multidetector CT imaging of the chest was performed following the standard protocol without IV contrast. RADIATION DOSE REDUCTION: This exam was performed according to the departmental dose-optimization program which includes automated exposure control, adjustment of the mA and/or kV according to patient size and/or use of iterative reconstruction technique. COMPARISON:  September 15, 2021 FINDINGS: Cardiovascular: There is a multi lead AICD. There is moderate severity calcification of the aortic arch and descending thoracic aorta, without evidence of aortic aneurysm. Normal heart size with marked severity coronary artery calcification. No pericardial effusion. Mediastinum/Nodes: No enlarged mediastinal or axillary lymph nodes. Thyroid gland, trachea, and esophagus demonstrate no significant findings. Lungs/Pleura: There is mild, stable lingular scarring and/or atelectasis. Moderate severity consolidation is seen within the left lower lobe. This is increased in severity when compared to the prior study. Chronic left lower lobe volume loss is noted. There is no evidence of a pleural effusion or pneumothorax. Upper Abdomen: Noninflamed diverticula are seen within the proximal descending colon. Musculoskeletal: No chest wall mass or suspicious bone lesions identified. IMPRESSION: 1.  Moderate severity left lower lobe consolidation, which is likely a result of atelectasis and/or infiltrate. Follow-up to resolution is recommended, as an underlying neoplastic process cannot be excluded. 2. Chronic left lower lobe volume loss. 3. Marked severity coronary artery calcification. 4. Colonic diverticulosis. Aortic Atherosclerosis (ICD10-I70.0). Electronically Signed   By: Virgina Norfolk M.D.   On: 05/18/2022 22:32   DG Chest 2 View  Result Date: 05/18/2022 CLINICAL DATA:  Generalized weakness and shortness of breath with exertion. Patient diagnosed with pneumonia on 06/28 PCP and sent home with antibiotics. EXAM: CHEST - 2 VIEW COMPARISON:  Chest radiograph 05/13/2022. FINDINGS: Left chest wall pacemaker with leads overlying the right atrium, right ventricle, and coronary vein. Stable eventration of the left hemidiaphragm. Heart size is difficult to assess due to elevation of the right hemidiaphragm but appears stable. Aortic calcifications. Minimal patchy airspace opacity in the medial aspect of the left lower lobe, obscuring the border of the thoracic aorta. No pleural effusion or pneumothorax. IMPRESSION: Minimal patchy airspace opacity in the medial left lower lobe, not significantly changed compared to 05/13/2022. No new focal consolidation. No pleural effusion. Electronically Signed   By: Ileana Roup M.D.   On: 05/18/2022 17:27   DG Chest 2 View  Result Date: 05/14/2022 CLINICAL DATA:  Dyspnea on exertion. EXAM: CHEST - 2 VIEW COMPARISON:  October 15, 2020 FINDINGS: The heart size and mediastinal contours are stable. Cardiac pacemaker is identified. Mild patchy consolidation of left lung base with  minimal left pleural effusion noted. The right lung is clear. The visualized skeletal structures are stable. IMPRESSION: Left lung base pneumonia with minimal left pleural effusion. Electronically Signed   By: Abelardo Diesel M.D.   On: 05/14/2022 12:40    Antimicrobials:  CEFTRIAXONE AND  DOXYCYLINE    Subjective: REPORTS FEELING SIGNIFICANTLY BETTER THAN WHEN HE CAME IN  Objective: Vitals:   05/21/22 0427 05/21/22 0921 05/21/22 0923 05/21/22 1215  BP: 126/84   115/80  Pulse: 63   61  Resp: 17 (!) 26 (!) 21 18  Temp: 99.1 F (37.3 C)   98.4 F (36.9 C)  TempSrc:    Oral  SpO2: 98% 97% 99% 100%  Weight: 89 kg     Height:        Intake/Output Summary (Last 24 hours) at 05/21/2022 1625 Last data filed at 05/21/2022 0815 Gross per 24 hour  Intake 520 ml  Output 700 ml  Net -180 ml   Filed Weights   05/19/22 1632 05/20/22 0500 05/21/22 0427  Weight: 93.3 kg 87.9 kg 89 kg    Examination:  General exam: Appears calm and comfortable  Respiratory system: TRACE RALES, NO RHONCHI Cardiovascular system: S1 & S2 heard, RRR. No JVD, murmurs, rubs, gallops or clicks. No pedal edema. Gastrointestinal system: Abdomen is nondistended, soft and nontender. No organomegaly or masses felt. Normal bowel sounds heard. Central nervous system: Alert and oriented. No focal neurological deficits. Extremities: WWP, N/V INTACT Skin: No rashes, lesions or ulcers Psychiatry: Judgement and insight appear normal. Mood & affect appropriate.     Data Reviewed: I have personally reviewed following labs and imaging studies  CBC: Recent Labs  Lab 05/18/22 1631 05/20/22 0531 05/21/22 0551  WBC 10.4 12.2* 9.6  NEUTROABS 7.9*  --   --   HGB 16.2 16.1 14.6  HCT 48.3 48.6 44.6  MCV 88.5 91.7 93.9  PLT 202 175 283   Basic Metabolic Panel: Recent Labs  Lab 05/18/22 1631 05/20/22 0531 05/21/22 0551  NA 129* 136 140  K 3.7 3.3* 3.5  CL 88* 101 105  CO2 '26 24 26  '$ GLUCOSE 127* 92 96  BUN 39* 30* 23  CREATININE 2.03* 1.24 1.13  CALCIUM 10.9* 9.6 9.2   GFR: Estimated Creatinine Clearance: 58.5 mL/min (by C-G formula based on SCr of 1.13 mg/dL). Liver Function Tests: Recent Labs  Lab 05/18/22 1631 05/20/22 0531  AST 19 32  ALT 19 24  ALKPHOS 59 64  BILITOT 1.5* 1.2  PROT  8.6* 7.5  ALBUMIN 5.0 4.1   No results for input(s): "LIPASE", "AMYLASE" in the last 168 hours. No results for input(s): "AMMONIA" in the last 168 hours. Coagulation Profile: No results for input(s): "INR", "PROTIME" in the last 168 hours. Cardiac Enzymes: No results for input(s): "CKTOTAL", "CKMB", "CKMBINDEX", "TROPONINI" in the last 168 hours. BNP (last 3 results) Recent Labs    05/13/22 1216  PROBNP 19.0   HbA1C: No results for input(s): "HGBA1C" in the last 72 hours. CBG: No results for input(s): "GLUCAP" in the last 168 hours. Lipid Profile: No results for input(s): "CHOL", "HDL", "LDLCALC", "TRIG", "CHOLHDL", "LDLDIRECT" in the last 72 hours. Thyroid Function Tests: No results for input(s): "TSH", "T4TOTAL", "FREET4", "T3FREE", "THYROIDAB" in the last 72 hours. Anemia Panel: No results for input(s): "VITAMINB12", "FOLATE", "FERRITIN", "TIBC", "IRON", "RETICCTPCT" in the last 72 hours. Sepsis Labs: Recent Labs  Lab 05/18/22 1944  LATICACIDVEN 1.7    Recent Results (from the past 240 hour(s))  Blood culture (  routine x 2)     Status: None (Preliminary result)   Collection Time: 05/18/22 10:57 PM   Specimen: BLOOD  Result Value Ref Range Status   Specimen Description   Final    BLOOD Blood Culture adequate volume Performed at [facility], [address] [place], [place] [address]    Special Requests   Final    BLOOD RIGHT HAND Performed at [facility], [address] [place], [place] [address]    Culture   Final    NO GROWTH 2 DAYS Performed at [facility], [place] [address], [place], [place] [address]    Report Status PENDING  Incomplete  Blood culture (routine x 2)     Status: None (Preliminary result)   Collection Time: 05/18/22 11:06 PM   Specimen: BLOOD  Result Value Ref Range Status   Specimen Description   Final    BLOOD Blood Culture adequate volume Performed at [facility], [address] [place], [place] [address]    Special Requests   Final    BLOOD LEFT WRIST Performed at [facility], [address] [place], [place] [address]    Culture   Final    NO GROWTH 2 DAYS Performed at [facility], [place] [address] [place], [place] [address]    Report Status PENDING  Incomplete  Respiratory (~20 pathogens) panel by PCR     Status: None   Collection Time: 05/19/22  6:14 PM   Specimen: Nasopharyngeal Swab; Respiratory  Result Value Ref Range Status   Adenovirus NOT DETECTED NOT DETECTED Final   Coronavirus 229E NOT DETECTED NOT DETECTED Final    Comment: (NOTE) The Coronavirus on the Respiratory Panel, DOES NOT test for the novel  Coronavirus (2019 nCoV)    Coronavirus HKU1 NOT DETECTED NOT DETECTED Final   Coronavirus NL63 NOT DETECTED NOT DETECTED Final   Coronavirus OC43 NOT DETECTED NOT DETECTED Final   Metapneumovirus NOT DETECTED NOT DETECTED Final   Rhinovirus / Enterovirus NOT DETECTED NOT DETECTED Final   Influenza A NOT DETECTED NOT DETECTED Final   Influenza B NOT DETECTED NOT DETECTED Final   Parainfluenza Virus 1 NOT DETECTED NOT DETECTED Final   Parainfluenza Virus 2 NOT DETECTED NOT DETECTED Final   Parainfluenza Virus 3 NOT DETECTED NOT DETECTED Final   Parainfluenza Virus 4 NOT DETECTED NOT DETECTED Final   Respiratory Syncytial Virus NOT DETECTED NOT DETECTED Final   Bordetella pertussis NOT DETECTED NOT DETECTED Final   Bordetella Parapertussis NOT DETECTED NOT DETECTED Final   Chlamydophila pneumoniae NOT DETECTED NOT DETECTED Final   Mycoplasma pneumoniae NOT DETECTED NOT DETECTED Final    Comment: Performed at [facility], [place]. [address], [place], [place] [address]         Radiology Studies: No results found.      Scheduled Meds:  atorvastatin  80 mg Oral q1800   budesonide (PULMICORT) nebulizer solution  0.25 mg Nebulization BID   cholecalciferol  2,000 Units Oral Daily    dorzolamide-timolol  1 drop Both Eyes BID   doxycycline  100 mg Oral Q12H   flecainide  100 mg Oral BID   latanoprost  1 drop Both Eyes QHS   pantoprazole  40 mg Oral Daily   rivaroxaban  20 mg Oral Q supper   Continuous Infusions:  sodium chloride 50 mL/hr at 05/20/22 1042   cefTRIAXone (ROCEPHIN)  IV 2 g (05/20/22 1741)     LOS: 1  day    Time spent: [place], MD [facility]  If 7PM-7AM, please contact night-coverage  05/21/2022, 4:25 PM

## 2022-05-21 NOTE — Progress Notes (Signed)
Chaplain met with Luqman and his daughter.  Tarek was requesting prayer for healing and for the safety of his family.  Chaplain provided listening, prayer and emotional support.  He is having a difficult time with this hospitalization because he has been able to be so active over the years. He appreciated the visit and the care he is receiving here.  [address] [place] Pager, [phone]

## 2022-05-21 NOTE — Plan of Care (Signed)
No acute events overnight. Problem: Education: Goal: Knowledge of General Education information will improve Description: Including pain rating scale, medication(s)/side effects and non-pharmacologic comfort measures Outcome: Progressing   Problem: Health Behavior/Discharge Planning: Goal: Ability to manage health-related needs will improve Outcome: Progressing   Problem: Clinical Measurements: Goal: Ability to maintain clinical measurements within normal limits will improve Outcome: Progressing Goal: Will remain free from infection Outcome: Progressing Goal: Diagnostic test results will improve Outcome: Progressing Goal: Respiratory complications will improve Outcome: Progressing Goal: Cardiovascular complication will be avoided Outcome: Progressing   Problem: Activity: Goal: Risk for activity intolerance will decrease Outcome: Progressing   Problem: Nutrition: Goal: Adequate nutrition will be maintained Outcome: Progressing   Problem: Coping: Goal: Level of anxiety will decrease Outcome: Progressing   Problem: Elimination: Goal: Will not experience complications related to bowel motility Outcome: Progressing Goal: Will not experience complications related to urinary retention Outcome: Progressing   Problem: Pain Managment: Goal: General experience of comfort will improve Outcome: Progressing   Problem: Safety: Goal: Ability to remain free from injury will improve Outcome: Progressing   Problem: Skin Integrity: Goal: Risk for impaired skin integrity will decrease Outcome: Progressing   Problem: Education: Goal: Knowledge of disease or condition will improve Outcome: Progressing Goal: Knowledge of the prescribed therapeutic regimen will improve Outcome: Progressing Goal: Individualized Educational Video(s) Outcome: Progressing   Problem: Activity: Goal: Ability to tolerate increased activity will improve Outcome: Progressing Goal: Will verbalize the  importance of balancing activity with adequate rest periods Outcome: Progressing   Problem: Respiratory: Goal: Ability to maintain a clear airway will improve Outcome: Progressing Goal: Levels of oxygenation will improve Outcome: Progressing Goal: Ability to maintain adequate ventilation will improve Outcome: Progressing   Problem: Activity: Goal: Ability to tolerate increased activity will improve Outcome: Progressing   Problem: Clinical Measurements: Goal: Ability to maintain a body temperature in the normal range will improve Outcome: Progressing   Problem: Respiratory: Goal: Ability to maintain adequate ventilation will improve Outcome: Progressing Goal: Ability to maintain a clear airway will improve Outcome: Progressing

## 2022-05-21 NOTE — Telephone Encounter (Signed)
Forward to PCP.

## 2022-05-22 DIAGNOSIS — I495 Sick sinus syndrome: Secondary | ICD-10-CM

## 2022-05-22 DIAGNOSIS — I2510 Atherosclerotic heart disease of native coronary artery without angina pectoris: Secondary | ICD-10-CM | POA: Diagnosis not present

## 2022-05-22 DIAGNOSIS — N179 Acute kidney failure, unspecified: Secondary | ICD-10-CM | POA: Diagnosis not present

## 2022-05-22 DIAGNOSIS — J449 Chronic obstructive pulmonary disease, unspecified: Secondary | ICD-10-CM | POA: Diagnosis not present

## 2022-05-22 DIAGNOSIS — J189 Pneumonia, unspecified organism: Secondary | ICD-10-CM | POA: Diagnosis not present

## 2022-05-22 LAB — BASIC METABOLIC PANEL
Anion gap: 8 (ref 5–15)
BUN: 16 mg/dL (ref 8–23)
CO2: 25 mmol/L (ref 22–32)
Calcium: 8.9 mg/dL (ref 8.9–10.3)
Chloride: 106 mmol/L (ref 98–111)
Creatinine, Ser: 0.92 mg/dL (ref 0.61–1.24)
GFR, Estimated: >60 mL/min (ref >60)
Glucose, Bld: 103 mg/dL — ABNORMAL HIGH (ref 70–99)
Potassium: 3.9 mmol/L (ref 3.5–5.1)
Sodium: 139 mmol/L (ref 135–145)

## 2022-05-22 LAB — CBC WITH DIFFERENTIAL/PLATELET
Abs Immature Granulocytes: 0.04 10*3/uL (ref 0.00–0.07)
Basophils Absolute: 0.1 10*3/uL (ref 0.0–0.1)
Basophils Relative: 1 %
Eosinophils Absolute: 0.2 10*3/uL (ref 0.0–0.5)
Eosinophils Relative: 2 %
HCT: 43.8 % (ref 39.0–52.0)
Hemoglobin: 14.3 g/dL (ref 13.0–17.0)
Immature Granulocytes: 1 %
Lymphocytes Relative: 22 %
Lymphs Abs: 1.9 10*3/uL (ref 0.7–4.0)
MCH: 30.5 pg (ref 26.0–34.0)
MCHC: 32.6 g/dL (ref 30.0–36.0)
MCV: 93.4 fL (ref 80.0–100.0)
Monocytes Absolute: 1.1 10*3/uL — ABNORMAL HIGH (ref 0.1–1.0)
Monocytes Relative: 13 %
Neutro Abs: 5.4 10*3/uL (ref 1.7–7.7)
Neutrophils Relative %: 61 %
Platelets: 169 10*3/uL (ref 150–400)
RBC: 4.69 MIL/uL (ref 4.22–5.81)
RDW: 13.2 % (ref 11.5–15.5)
WBC: 8.6 10*3/uL (ref 4.0–10.5)
nRBC: 0 % (ref 0.0–0.2)

## 2022-05-22 MED ORDER — AMOXICILLIN-POT CLAVULANATE 500-125 MG PO TABS: 1.0000 | ORAL_TABLET | Freq: Three times a day (TID) | ORAL | 0 refills | Status: AC

## 2022-05-22 NOTE — Discharge Summary (Signed)
Physician Discharge Summary  [name] YHC:[id number] DOB: 1941/04/03 DOA: 05/18/2022  PCP: Biagio Borg, MD  Admit date: 05/18/2022    Discharge date: 05/22/2022  Admitted From: Inpatient Disposition: home  Recommendations for Outpatient Follow-up:  Follow up with PCP in 1-2 weeks Please obtain BMP/CBC in one week Please follow up on the following pending results:  Home Health:No Equipment/Devices:no new equipemnt  Discharge Condition:Stable CODE STATUS:Full code Diet recommendation: Regular healthy diet  Brief/Interim Summary: 81yom w/ history of chronic diastolic CHF, HLD, CAD-(seen on coronary CTA), hypertension, paroxysmal atrial fibrillation, sick sinus syndrome  s/p PPM presented to ED with complaints of progressively worsening generalized weakness, shortness of breath, poor appetite.  Recently seen by PCP on 6/29, diagnosed with pneumonia-had labs that showed creatinine 1.5, D-dimer less than 0.19 WBC count 8.4 BNP 19, and prescribed Levaquin, but symptoms have been worsening despite being on antibiotics. In the ED vitals stable afebrile initially blood pressure in 90s, labs showed normal lactic acid, troponin 6-BMP with hyponatremia at 129, AKI with creatinine 2.0, WBC 10.4 UA unremarkable, blood culture sent,CT chest w/o contrast-moderate severity left lower lobe consolidation with recommendation for repeat imaging after treatment of suspected pneumonia to ensure there is no evidence of underlying malignancy.  Started on IV ceftriaxone doxycycline and admission requested for further management. Patient arrived to [facility] 7/4 5 PM Currently heart rate is stable, on room air. He denies any nausea, vomiting,headache, focal weakness, numbness tingling, speech difficulties, focal weakness, blackish stool rectal bleeding  After admission patient was started on doxycycline and ceftriaxone which she tolerated well blood cultures were obtained which were negative, he had a  negative urine antigen and negative respiratory panel.  Patient returned to baseline was on room air with a normal white blood cell count and no further complications.  Recommend follow-up imaging in 3 weeks to assure clearance and to rule out any underlying neoplasm.  While in the hospital patient was noted to be dehydrated he was appropriately fluid resuscitated his home medications were initially held but he will restart those on disposition without acute changes.  Patient to follow-up with primary care physician within 2 to 3 weeks for follow-up as noted above  Discharge Diagnoses:  Principal Problem:   Pneumonia of left lower lobe due to infectious organism Active Problems:   Coronary artery calcification seen on CAT scan   Sick sinus syndrome (HCC)   Essential hypertension   COPD GOLD III with marked reversibility    PAF (paroxysmal atrial fibrillation) (HCC)   AKI (acute kidney injury) ([place])   Hyponatremia   Hypercalcemia   Obesity, Class I, BMI 30-34.9   Hypokalemia   Pneumonia    Discharge Instructions  Discharge Instructions     Call MD for:  difficulty breathing, headache or visual disturbances   Complete by: As directed    Call MD for:  hives   Complete by: As directed    Call MD for:  persistant dizziness or light-headedness   Complete by: As directed    Call MD for:  persistant nausea and vomiting   Complete by: As directed    Call MD for:  redness, tenderness, or signs of infection (pain, swelling, redness, odor or green/yellow discharge around incision site)   Complete by: As directed    Call MD for:  temperature >100.4   Complete by: As directed    Diet - low sodium heart healthy   Complete by: As directed    Discharge instructions  Complete by: As directed    Returnh to local er for any acute change in medical condition   Increase activity slowly   Complete by: As directed       Allergies as of 05/22/2022   No Known Allergies      Medication  List     STOP taking these medications    levofloxacin 500 MG tablet Commonly known as: Levaquin       TAKE these medications    acetaminophen 650 MG CR tablet Commonly known as: TYLENOL Take 650 mg by mouth in the morning and at bedtime.   albuterol 108 (90 Base) MCG/ACT inhaler Commonly known as: VENTOLIN HFA Inhale 2 puffs into the lungs every 6 (six) hours as needed for wheezing or shortness of breath.   amLODipine 10 MG tablet Commonly known as: NORVASC Take 1 tablet (10 mg total) by mouth at bedtime.   amoxicillin-clavulanate 500-125 MG tablet Commonly known as: Augmentin Take 1 tablet (500 mg total) by mouth 3 (three) times daily for 5 days.   atorvastatin 80 MG tablet Commonly known as: LIPITOR TAKE 1 TABLET BY MOUTH EVERY DAY   clobetasol cream 0.05 % Commonly known as: TEMOVATE APPLY TO AFFECTED AREA TWICE A DAY What changed: See the new instructions.   diclofenac Sodium 1 % Gel Commonly known as: VOLTAREN Apply 2 g topically daily as needed (pain).   dorzolamide-timolol 22.3-6.8 MG/ML ophthalmic solution Commonly known as: COSOPT Place 1 drop into both eyes in the morning and at bedtime.   fexofenadine 180 MG tablet Commonly known as: ALLEGRA TAKE 1 TABLET (180 MG TOTAL) BY MOUTH DAILY. What changed: See the new instructions.   flecainide 100 MG tablet Commonly known as: TAMBOCOR TAKE 1 TABLET BY MOUTH TWICE A DAY   furosemide 20 MG tablet Commonly known as: LASIX TAKE 1 TABLET IF WEIGHT GAIN IS > 3LBS OVERNIGHT OR >5LBS IN A WEEK NO MORE THAN 2 TABS WEEKLY What changed: See the new instructions.   hydrochlorothiazide 25 MG tablet Commonly known as: HYDRODIURIL Take 1 tablet (25 mg total) by mouth daily.   ketoconazole 2 % cream Commonly known as: NIZORAL Apply 1 application topically daily. What changed:  when to take this reasons to take this   ketoconazole 2 % shampoo Commonly known as: NIZORAL Apply 1 Application topically See  admin instructions. Two times weekly as needed for irritation What changed: Another medication with the same name was changed. Make sure you understand how and when to take each.   latanoprost 0.005 % ophthalmic solution Commonly known as: XALATAN Place 1 drop into both eyes at bedtime.   meclizine 12.5 MG tablet Commonly known as: ANTIVERT 1 TAB BY MOUTH EVERY 8 HRS AS NEEDED What changed: See the new instructions.   Metamucil 0.52 g capsule Generic drug: psyllium Take 2 capsules by mouth daily.   metoprolol succinate 25 MG 24 hr tablet Commonly known as: Toprol XL Take 1 tablet (25 mg total) by mouth at bedtime. What changed: when to take this   omeprazole 40 MG capsule Commonly known as: PRILOSEC TAKE 1 CAPSULE BY MOUTH EVERY DAY AS NEEDED What changed:  how much to take how to take this when to take this additional instructions   oxybutynin 10 MG 24 hr tablet Commonly known as: DITROPAN-XL TAKE 1 TABLET (10 MG TOTAL) BY MOUTH DAILY.   tamsulosin 0.4 MG Caps capsule Commonly known as: FLOMAX TAKE 1 CAPSULE BY MOUTH TWICE A DAY What changed: when to  take this   telmisartan 20 MG tablet Commonly known as: MICARDIS Take 1 tablet (20 mg total) by mouth at bedtime. What changed: when to take this   traMADol 50 MG tablet Commonly known as: ULTRAM TAKE 1 TABLET BY MOUTH EVERY 6 HOURS AS NEEDED. FOR PAIN What changed:  reasons to take this additional instructions   Trelegy Ellipta 100-62.5-25 MCG/ACT Aepb Generic drug: Fluticasone-Umeclidin-Vilant Inhale 1 puff into the lungs daily. What changed: when to take this   Trelegy Ellipta 100-62.5-25 MCG/ACT Aepb Generic drug: Fluticasone-Umeclidin-Vilant Inhale 1 puff into the lungs daily. What changed: Another medication with the same name was changed. Make sure you understand how and when to take each.   Vitamin D3 50 MCG (2000 UT) Tabs Take 2,000 Units by mouth daily.   Xarelto 20 MG Tabs tablet Generic drug:  rivaroxaban TAKE 1 TABLET BY MOUTH DAILY WITH SUPPER. What changed: how much to take        No Known Allergies  Consultations: None   Procedures/Studies: CT Chest Wo Contrast  Result Date: 05/18/2022 CLINICAL DATA:  Generalized weakness and shortness of breath. EXAM: CT CHEST WITHOUT CONTRAST TECHNIQUE: Multidetector CT imaging of the chest was performed following the standard protocol without IV contrast. RADIATION DOSE REDUCTION: This exam was performed according to the departmental dose-optimization program which includes automated exposure control, adjustment of the mA and/or kV according to patient size and/or use of iterative reconstruction technique. COMPARISON:  September 15, 2021 FINDINGS: Cardiovascular: There is a multi lead AICD. There is moderate severity calcification of the aortic arch and descending thoracic aorta, without evidence of aortic aneurysm. Normal heart size with marked severity coronary artery calcification. No pericardial effusion. Mediastinum/Nodes: No enlarged mediastinal or axillary lymph nodes. Thyroid gland, trachea, and esophagus demonstrate no significant findings. Lungs/Pleura: There is mild, stable lingular scarring and/or atelectasis. Moderate severity consolidation is seen within the left lower lobe. This is increased in severity when compared to the prior study. Chronic left lower lobe volume loss is noted. There is no evidence of a pleural effusion or pneumothorax. Upper Abdomen: Noninflamed diverticula are seen within the proximal descending colon. Musculoskeletal: No chest wall mass or suspicious bone lesions identified. IMPRESSION: 1. Moderate severity left lower lobe consolidation, which is likely a result of atelectasis and/or infiltrate. Follow-up to resolution is recommended, as an underlying neoplastic process cannot be excluded. 2. Chronic left lower lobe volume loss. 3. Marked severity coronary artery calcification. 4. Colonic diverticulosis. Aortic  Atherosclerosis (ICD10-I70.0). Electronically Signed   By: Virgina Norfolk M.D.   On: 05/18/2022 22:32   DG Chest 2 View  Result Date: 05/18/2022 CLINICAL DATA:  Generalized weakness and shortness of breath with exertion. Patient diagnosed with pneumonia on 06/28 PCP and sent home with antibiotics. EXAM: CHEST - 2 VIEW COMPARISON:  Chest radiograph 05/13/2022. FINDINGS: Left chest wall pacemaker with leads overlying the right atrium, right ventricle, and coronary vein. Stable eventration of the left hemidiaphragm. Heart size is difficult to assess due to elevation of the right hemidiaphragm but appears stable. Aortic calcifications. Minimal patchy airspace opacity in the medial aspect of the left lower lobe, obscuring the border of the thoracic aorta. No pleural effusion or pneumothorax. IMPRESSION: Minimal patchy airspace opacity in the medial left lower lobe, not significantly changed compared to 05/13/2022. No new focal consolidation. No pleural effusion. Electronically Signed   By: Ileana Roup M.D.   On: 05/18/2022 17:27   DG Chest 2 View  Result Date: 05/14/2022 CLINICAL DATA:  Dyspnea  on exertion. EXAM: CHEST - 2 VIEW COMPARISON:  October 15, 2020 FINDINGS: The heart size and mediastinal contours are stable. Cardiac pacemaker is identified. Mild patchy consolidation of left lung base with minimal left pleural effusion noted. The right lung is clear. The visualized skeletal structures are stable. IMPRESSION: Left lung base pneumonia with minimal left pleural effusion. Electronically Signed   By: Abelardo Diesel M.D.   On: 05/14/2022 12:40      Subjective: Patient reports feeling well, ambulating in room without complication.  Discharge Exam: Vitals:   05/22/22 0517 05/22/22 0818  BP: (!) 143/93   Pulse: 66   Resp: 18   Temp: 97.6 F (36.4 C)   SpO2: 99% 97%   Vitals:   05/21/22 1945 05/21/22 2036 05/22/22 0517 05/22/22 0818  BP:  139/86 (!) 143/93   Pulse: 82 66 66   Resp: '18 17 18    '$ Temp:  98.5 F (36.9 C) 97.6 F (36.4 C)   TempSrc:   Oral   SpO2: 97% 99% 99% 97%  Weight:   90.1 kg   Height:        General: Pt is alert, awake, not in acute distress Cardiovascular: RRR, S1/S2 +, no rubs, no gallops Respiratory: CTA bilaterally, no wheezing, no rhonchi Abdominal: Soft, NT, ND, bowel sounds + Extremities: no edema, no cyanosis    The results of significant diagnostics from this hospitalization (including imaging, microbiology, ancillary and laboratory) are listed below for reference.     Microbiology: Recent Results (from the past 240 hour(s))  Blood culture (routine x 2)     Status: None (Preliminary result)   Collection Time: 05/18/22 10:57 PM   Specimen: BLOOD  Result Value Ref Range Status   Specimen Description   Final    BLOOD Blood Culture adequate volume Performed at [facility], [address] [place], [place] [address]    Special Requests   Final    BLOOD RIGHT HAND Performed at [facility], [address], [place], [place] [address]    Culture   Final    NO GROWTH 2 DAYS Performed at [facility], [place] [address] [place], [place] [address]    Report Status PENDING  Incomplete  Blood culture (routine x 2)     Status: None (Preliminary result)   Collection Time: 05/18/22 11:06 PM   Specimen: BLOOD  Result Value Ref Range Status   Specimen Description   Final    BLOOD Blood Culture adequate volume Performed at [facility], [address] [place], [place] [address]    Special Requests   Final    BLOOD LEFT WRIST Performed at [facility], [address] [place], [place] [address]    Culture   Final    NO GROWTH 2 DAYS Performed at [facility], [place] [address] [place], [place] [address]    Report Status PENDING  Incomplete  Respiratory (~20 pathogens) panel by PCR     Status: None   Collection Time: 05/19/22  6:14 PM   Specimen:  Nasopharyngeal Swab; Respiratory  Result Value Ref Range Status   Adenovirus NOT DETECTED NOT DETECTED Final   Coronavirus 229E NOT DETECTED NOT DETECTED Final    Comment: (NOTE) The Coronavirus on the Respiratory Panel, DOES NOT test for the novel  Coronavirus (2019 nCoV)    Coronavirus HKU1 NOT DETECTED NOT DETECTED Final   Coronavirus NL63 NOT DETECTED NOT DETECTED Final   Coronavirus OC43 NOT DETECTED  NOT DETECTED Final   Metapneumovirus NOT DETECTED NOT DETECTED Final   Rhinovirus / Enterovirus NOT DETECTED NOT DETECTED Final   Influenza A NOT DETECTED NOT DETECTED Final   Influenza B NOT DETECTED NOT DETECTED Final   Parainfluenza Virus 1 NOT DETECTED NOT DETECTED Final   Parainfluenza Virus 2 NOT DETECTED NOT DETECTED Final   Parainfluenza Virus 3 NOT DETECTED NOT DETECTED Final   Parainfluenza Virus 4 NOT DETECTED NOT DETECTED Final   Respiratory Syncytial Virus NOT DETECTED NOT DETECTED Final   Bordetella pertussis NOT DETECTED NOT DETECTED Final   Bordetella Parapertussis NOT DETECTED NOT DETECTED Final   Chlamydophila pneumoniae NOT DETECTED NOT DETECTED Final   Mycoplasma pneumoniae NOT DETECTED NOT DETECTED Final    Comment: Performed at [facility], [place] [address], [place], [place] [address]     Labs: BNP (last 3 results) Recent Labs    05/18/22 2306  BNP 71.6   Basic Metabolic Panel: Recent Labs  Lab 05/18/22 1631 05/20/22 0531 05/21/22 0551 05/22/22 0731  NA 129* 136 140 139  K 3.7 3.3* 3.5 3.9  CL 88* 101 105 106  CO2 '26 24 26 25  '$ GLUCOSE 127* 92 96 103*  BUN 39* 30* 23 16  CREATININE 2.03* 1.24 1.13 0.92  CALCIUM 10.9* 9.6 9.2 8.9   Liver Function Tests: Recent Labs  Lab 05/18/22 1631 05/20/22 0531  AST 19 32  ALT 19 24  ALKPHOS 59 64  BILITOT 1.5* 1.2  PROT 8.6* 7.5  ALBUMIN 5.0 4.1   No results for input(s): "LIPASE", "AMYLASE" in the last 168 hours. No results for input(s): "AMMONIA" in the last 168 hours. CBC: Recent Labs   Lab 05/18/22 1631 05/20/22 0531 05/21/22 0551 05/22/22 0731  WBC 10.4 12.2* 9.6 8.6  NEUTROABS 7.9*  --   --  5.4  HGB 16.2 16.1 14.6 14.3  HCT 48.3 48.6 44.6 43.8  MCV 88.5 91.7 93.9 93.4  PLT 202 175 163 169   Cardiac Enzymes: No results for input(s): "CKTOTAL", "CKMB", "CKMBINDEX", "TROPONINI" in the last 168 hours. BNP: Invalid input(s): "POCBNP" CBG: No results for input(s): "GLUCAP" in the last 168 hours. D-Dimer No results for input(s): "DDIMER" in the last 72 hours. Hgb A1c No results for input(s): "HGBA1C" in the last 72 hours. Lipid Profile No results for input(s): "CHOL", "HDL", "LDLCALC", "TRIG", "CHOLHDL", "LDLDIRECT" in the last 72 hours. Thyroid function studies No results for input(s): "TSH", "T4TOTAL", "T3FREE", "THYROIDAB" in the last 72 hours.  Invalid input(s): "FREET3" Anemia work up No results for input(s): "VITAMINB12", "FOLATE", "FERRITIN", "TIBC", "IRON", "RETICCTPCT" in the last 72 hours. Urinalysis    Component Value Date/Time   COLORURINE YELLOW 05/19/2022 0725   APPEARANCEUR CLEAR 05/19/2022 0725   LABSPEC 1.014 05/19/2022 0725   PHURINE 6.0 05/19/2022 0725   GLUCOSEU NEGATIVE 05/19/2022 0725   GLUCOSEU NEGATIVE 11/24/2021 0920   HGBUR NEGATIVE 05/19/2022 0725   BILIRUBINUR NEGATIVE 05/19/2022 0725   KETONESUR NEGATIVE 05/19/2022 0725   PROTEINUR NEGATIVE 05/19/2022 0725   UROBILINOGEN 1.0 11/24/2021 0920   NITRITE NEGATIVE 05/19/2022 0725   LEUKOCYTESUR NEGATIVE 05/19/2022 0725   Sepsis Labs Recent Labs  Lab 05/18/22 1631 05/20/22 0531 05/21/22 0551 05/22/22 0731  WBC 10.4 12.2* 9.6 8.6   Microbiology Recent Results (from the past 240 hour(s))  Blood culture (routine x 2)     Status: None (Preliminary result)   Collection Time: 05/18/22 10:57 PM   Specimen: BLOOD  Result Value Ref Range Status  Specimen Description   Final    BLOOD Blood Culture adequate volume Performed at [facility], [address], [place], [place] [address]    Special Requests   Final    BLOOD RIGHT HAND Performed at [facility], [address] [place], [place] [address]    Culture   Final    NO GROWTH 2 DAYS Performed at [facility], [place] [address] [place], [place] [address]    Report Status PENDING  Incomplete  Blood culture (routine x 2)     Status: None (Preliminary result)   Collection Time: 05/18/22 11:06 PM   Specimen: BLOOD  Result Value Ref Range Status   Specimen Description   Final    BLOOD Blood Culture adequate volume Performed at [facility], [address], [place], [place] [address]    Special Requests   Final    BLOOD LEFT WRIST Performed at [facility], [address] [place], [place] [address]    Culture   Final    NO GROWTH 2 DAYS Performed at [facility], [place] [address], [place], [place] [address]    Report Status PENDING  Incomplete  Respiratory (~20 pathogens) panel by PCR     Status: None   Collection Time: 05/19/22  6:14 PM   Specimen: Nasopharyngeal Swab; Respiratory  Result Value Ref Range Status   Adenovirus NOT DETECTED NOT DETECTED Final   Coronavirus 229E NOT DETECTED NOT DETECTED Final    Comment: (NOTE) The Coronavirus on the Respiratory Panel, DOES NOT test for the novel  Coronavirus (2019 nCoV)    Coronavirus HKU1 NOT DETECTED NOT DETECTED Final   Coronavirus NL63 NOT DETECTED NOT DETECTED Final   Coronavirus OC43 NOT DETECTED NOT DETECTED Final   Metapneumovirus NOT DETECTED NOT DETECTED Final   Rhinovirus / Enterovirus NOT DETECTED NOT DETECTED Final   Influenza A NOT DETECTED NOT DETECTED Final   Influenza B NOT DETECTED NOT DETECTED Final   Parainfluenza Virus 1 NOT DETECTED NOT DETECTED Final   Parainfluenza Virus 2 NOT DETECTED NOT DETECTED Final   Parainfluenza Virus 3 NOT DETECTED NOT DETECTED Final   Parainfluenza Virus 4 NOT DETECTED NOT DETECTED Final   Respiratory  Syncytial Virus NOT DETECTED NOT DETECTED Final   Bordetella pertussis NOT DETECTED NOT DETECTED Final   Bordetella Parapertussis NOT DETECTED NOT DETECTED Final   Chlamydophila pneumoniae NOT DETECTED NOT DETECTED Final   Mycoplasma pneumoniae NOT DETECTED NOT DETECTED Final    Comment: Performed at [facility], [place]. [address], [place], [place] [address]     Time coordinating discharge: Over 30 minutes  SIGNED:   Nicolette Bang, MD  [facility] 05/22/2022, 9:50 AM Pager   If 7PM-7AM, please contact night-coverage [email or web address] Password [email or web address]

## 2022-05-22 NOTE — Plan of Care (Signed)
No acute events overnight.  Problem: Education: Goal: Knowledge of General Education information will improve Description: Including pain rating scale, medication(s)/side effects and non-pharmacologic comfort measures Outcome: Progressing   Problem: Health Behavior/Discharge Planning: Goal: Ability to manage health-related needs will improve Outcome: Progressing   Problem: Clinical Measurements: Goal: Ability to maintain clinical measurements within normal limits will improve Outcome: Progressing Goal: Will remain free from infection Outcome: Progressing Goal: Diagnostic test results will improve Outcome: Progressing Goal: Respiratory complications will improve Outcome: Progressing Goal: Cardiovascular complication will be avoided Outcome: Progressing   Problem: Activity: Goal: Risk for activity intolerance will decrease Outcome: Progressing   Problem: Nutrition: Goal: Adequate nutrition will be maintained Outcome: Progressing   Problem: Coping: Goal: Level of anxiety will decrease Outcome: Progressing   Problem: Elimination: Goal: Will not experience complications related to bowel motility Outcome: Progressing Goal: Will not experience complications related to urinary retention Outcome: Progressing   Problem: Pain Managment: Goal: General experience of comfort will improve Outcome: Progressing   Problem: Safety: Goal: Ability to remain free from injury will improve Outcome: Progressing   Problem: Skin Integrity: Goal: Risk for impaired skin integrity will decrease Outcome: Progressing   Problem: Education: Goal: Knowledge of disease or condition will improve Outcome: Progressing Goal: Knowledge of the prescribed therapeutic regimen will improve Outcome: Progressing Goal: Individualized Educational Video(s) Outcome: Progressing   Problem: Activity: Goal: Ability to tolerate increased activity will improve Outcome: Progressing Goal: Will verbalize the  importance of balancing activity with adequate rest periods Outcome: Progressing   Problem: Respiratory: Goal: Ability to maintain a clear airway will improve Outcome: Progressing Goal: Levels of oxygenation will improve Outcome: Progressing Goal: Ability to maintain adequate ventilation will improve Outcome: Progressing   Problem: Activity: Goal: Ability to tolerate increased activity will improve Outcome: Progressing   Problem: Clinical Measurements: Goal: Ability to maintain a body temperature in the normal range will improve Outcome: Progressing   Problem: Respiratory: Goal: Ability to maintain adequate ventilation will improve Outcome: Progressing Goal: Ability to maintain a clear airway will improve Outcome: Progressing

## 2022-05-22 NOTE — Progress Notes (Signed)
Pt discharged home. AVS printed ad educational teaching completed with teach back method. VSS. PIV removed. Pt has all belongings. No further questions at this time.

## 2022-05-24 LAB — CULTURE, BLOOD (ROUTINE X 2)
Culture: NO GROWTH
Culture: NO GROWTH
Specimen Description: ADEQUATE
Specimen Description: ADEQUATE

## 2022-05-25 ENCOUNTER — Telehealth: Payer: Self-pay

## 2022-05-25 ENCOUNTER — Ambulatory Visit: Payer: Medicare Other | Admitting: Internal Medicine

## 2022-05-25 NOTE — Telephone Encounter (Signed)
Transition Care Management Follow-up Telephone Call Date of discharge and from where: [facility] on 05/22/2022 Dx: J18.9 Pneumonia of left lower lobe due to infectious organism How have you been since you were released from the hospital? "I was doing okay at first, but my legs starting swelling again and I felt a cramp in the back of my leg; right now the swelling has gone down." Any questions or concerns? Yes, this nurse advised patient if he continues to have swelling in his lower extremities or pain he needs to call Dr. Gwynn Burly office to see another provider since Dr. Jenny Reichmann is out of the office this week or go back to ER asap.  Items Reviewed: Did the pt receive and understand the discharge instructions provided? Yes  Medications obtained and verified? Yes  Other? No  Any new allergies since your discharge? No  Dietary orders reviewed? Yes; regular heart healthy diet Do you have support at home? Yes  wife  Home Care and Equipment/Supplies: Were home health services ordered? no If so, what is the name of the agency? no  Has the agency set up a time to come to the patient's home? not applicable Were any new equipment or medical supplies ordered?  No What is the name of the medical supply agency? no Were you able to get the supplies/equipment? not applicable Do you have any questions related to the use of the equipment or supplies? No  Functional Questionnaire: (I = Independent and D = Dependent) ADLs: I  Bathing/Dressing- I  Meal Prep- I  Eating- I  Maintaining continence- I  Transferring/Ambulation- I  Managing Meds- I  Follow up appointments reviewed:  PCP Hospital f/u appt confirmed? Yes  Scheduled to see Cathlean Cower, MD on 06/01/2022 @ 9:00 a.m. [facility] f/u appt confirmed? No   Are transportation arrangements needed? No  If their condition worsens, is the pt aware to call PCP or go to the Emergency Dept.? Yes Was the patient provided with contact information  for the PCP's office or ED? Yes Was to pt encouraged to call back with questions or concerns? Yes

## 2022-05-30 ENCOUNTER — Other Ambulatory Visit: Payer: Self-pay | Admitting: Internal Medicine

## 2022-05-30 ENCOUNTER — Other Ambulatory Visit: Payer: Self-pay | Admitting: Pulmonary Disease

## 2022-05-30 DIAGNOSIS — I5032 Chronic diastolic (congestive) heart failure: Secondary | ICD-10-CM

## 2022-06-01 ENCOUNTER — Ambulatory Visit (INDEPENDENT_AMBULATORY_CARE_PROVIDER_SITE_OTHER): Payer: Medicare Other

## 2022-06-01 ENCOUNTER — Ambulatory Visit (INDEPENDENT_AMBULATORY_CARE_PROVIDER_SITE_OTHER): Payer: Medicare Other | Admitting: Internal Medicine

## 2022-06-01 ENCOUNTER — Encounter: Payer: Self-pay | Admitting: Internal Medicine

## 2022-06-01 VITALS — BP 88/60 | HR 60 | Temp 98.6°F | Ht 69.0 in | Wt 193.0 lb

## 2022-06-01 DIAGNOSIS — J449 Chronic obstructive pulmonary disease, unspecified: Secondary | ICD-10-CM | POA: Diagnosis not present

## 2022-06-01 DIAGNOSIS — R079 Chest pain, unspecified: Secondary | ICD-10-CM | POA: Diagnosis not present

## 2022-06-01 DIAGNOSIS — R0602 Shortness of breath: Secondary | ICD-10-CM | POA: Diagnosis not present

## 2022-06-01 DIAGNOSIS — R739 Hyperglycemia, unspecified: Secondary | ICD-10-CM | POA: Diagnosis not present

## 2022-06-01 DIAGNOSIS — J189 Pneumonia, unspecified organism: Secondary | ICD-10-CM

## 2022-06-01 DIAGNOSIS — I5022 Chronic systolic (congestive) heart failure: Secondary | ICD-10-CM | POA: Diagnosis not present

## 2022-06-01 DIAGNOSIS — M79661 Pain in right lower leg: Secondary | ICD-10-CM

## 2022-06-01 DIAGNOSIS — I10 Essential (primary) hypertension: Secondary | ICD-10-CM | POA: Diagnosis not present

## 2022-06-01 DIAGNOSIS — M7989 Other specified soft tissue disorders: Secondary | ICD-10-CM | POA: Diagnosis not present

## 2022-06-01 LAB — HEPATIC FUNCTION PANEL
ALT: 64 U/L — ABNORMAL HIGH (ref 0–53)
AST: 40 U/L — ABNORMAL HIGH (ref 0–37)
Albumin: 4.7 g/dL (ref 3.5–5.2)
Alkaline Phosphatase: 67 U/L (ref 39–117)
Bilirubin, Direct: 0.2 mg/dL (ref 0.0–0.3)
Total Bilirubin: 1.1 mg/dL (ref 0.2–1.2)
Total Protein: 7.6 g/dL (ref 6.0–8.3)

## 2022-06-01 LAB — URINALYSIS, ROUTINE W REFLEX MICROSCOPIC
Bilirubin Urine: NEGATIVE
Hgb urine dipstick: NEGATIVE
Ketones, ur: NEGATIVE
Leukocytes,Ua: NEGATIVE
Nitrite: NEGATIVE
Specific Gravity, Urine: 1.01 (ref 1.000–1.030)
Total Protein, Urine: NEGATIVE
Urine Glucose: NEGATIVE
Urobilinogen, UA: 0.2 (ref 0.0–1.0)
pH: 6.5 (ref 5.0–8.0)

## 2022-06-01 LAB — CBC WITH DIFFERENTIAL/PLATELET
Basophils Absolute: 0 10*3/uL (ref 0.0–0.1)
Basophils Relative: 0.5 % (ref 0.0–3.0)
Eosinophils Absolute: 0 10*3/uL (ref 0.0–0.7)
Eosinophils Relative: 0.4 % (ref 0.0–5.0)
HCT: 44.2 % (ref 39.0–52.0)
Hemoglobin: 14.8 g/dL (ref 13.0–17.0)
Lymphocytes Relative: 21.4 % (ref 12.0–46.0)
Lymphs Abs: 1.7 10*3/uL (ref 0.7–4.0)
MCHC: 33.5 g/dL (ref 30.0–36.0)
MCV: 91.4 fl (ref 78.0–100.0)
Monocytes Absolute: 0.9 10*3/uL (ref 0.1–1.0)
Monocytes Relative: 10.7 % (ref 3.0–12.0)
Neutro Abs: 5.3 10*3/uL (ref 1.4–7.7)
Neutrophils Relative %: 67 % (ref 43.0–77.0)
Platelets: 191 10*3/uL (ref 150.0–400.0)
RBC: 4.84 Mil/uL (ref 4.22–5.81)
RDW: 13.6 % (ref 11.5–15.5)
WBC: 7.9 10*3/uL (ref 4.0–10.5)

## 2022-06-01 LAB — BASIC METABOLIC PANEL
BUN: 26 mg/dL — ABNORMAL HIGH (ref 6–23)
CO2: 29 mEq/L (ref 19–32)
Calcium: 10.4 mg/dL (ref 8.4–10.5)
Chloride: 91 mEq/L — ABNORMAL LOW (ref 96–112)
Creatinine, Ser: 1.46 mg/dL (ref 0.40–1.50)
GFR: 45.58 mL/min — ABNORMAL LOW (ref >60.00)
Glucose, Bld: 125 mg/dL — ABNORMAL HIGH (ref 70–99)
Potassium: 3.6 mEq/L (ref 3.5–5.1)
Sodium: 133 mEq/L — ABNORMAL LOW (ref 135–145)

## 2022-06-01 LAB — LIPID PANEL
Cholesterol: 163 mg/dL (ref 0–200)
HDL: 59.9 mg/dL (ref >39.00)
LDL Cholesterol: 89 mg/dL (ref 0–99)
NonHDL: 103.2
Total CHOL/HDL Ratio: 3
Triglycerides: 72 mg/dL (ref 0.0–149.0)
VLDL: 14.4 mg/dL (ref 0.0–40.0)

## 2022-06-01 LAB — TSH: TSH: 1.29 u[IU]/mL (ref 0.35–5.50)

## 2022-06-01 LAB — D-DIMER, QUANTITATIVE: D-Dimer, Quant: 0.22 mcg/mL FEU (ref <0.50)

## 2022-06-01 LAB — BRAIN NATRIURETIC PEPTIDE: Pro B Natriuretic peptide (BNP): 19 pg/mL (ref 0.0–100.0)

## 2022-06-01 LAB — HEMOGLOBIN A1C: Hgb A1c MFr Bld: 6.6 % — ABNORMAL HIGH (ref 4.6–6.5)

## 2022-06-01 NOTE — Patient Instructions (Addendum)
OK to stop the amlodipine due to the lower blood pressure  Please continue all other medications as before, and refills have been done if requested.  Please have the pharmacy call with any other refills you may need.  Please keep your appointments with your specialists as you may have planned  You will be contacted regarding the referral for: leg ultrasound to check for blood clot  Please go to the XRAY Department in the first floor for the x-ray testing  Please go to the LAB at the blood drawing area for the tests to be done  You will be contacted by phone if any changes need to be made immediately.  Otherwise, you will receive a letter about your results with an explanation, but please check with MyChart first.  Please remember to sign up for MyChart if you have not done so, as this will be important to you in the future with finding out test results, communicating by private email, and scheduling acute appointments online when needed.  Please make an Appointment to return in 2 weeks, or sooner if needed

## 2022-06-01 NOTE — Progress Notes (Signed)
Patient ID: [name], male   DOB: 08-25-1941, 81 y.o.   MRN: [id number]        Chief Complaint: follow up hospn July 3 - 7 2023 with [facility] failed outpt tx       HPI:  [name] is a 81 y.o. male here post hospn without fever, chills, but c/o persistent mild sob  Cough improved and Pt denies chest pain, increased wheezing, orthopnea, PND, increased LE swelling, palpitations, dizziness or syncope.  Finished antibx with good compliance.  BP has been low normal at home, and trying to increase po fluids.  Does c/o ongoing fatigue, but denies signficant daytime hypersomnolence.  Also c/o new onset right calf pain and swelling started immediate post d/c and persists.   Pt denies polydipsia, polyuria, or new focal neuro s/s.          Transitional Care Management elements noted today: 1)  Date of D/C: as above 2)  Medication reconciliation:  done today at end visit 3)  Review of D/C summary or other information:  done today 4)  Review of need for f/u on pending diagnostic tests and treatments:  done today 5)  Review of need for Interaction with other providers who will assume or resume care of pt specific problems: done today 6)  Education of patient/family/guardian or caregiver: done today with wife present  Wt Readings from Last 3 Encounters:  06/01/22 193 lb (87.5 kg)  05/22/22 198 lb 10.2 oz (90.1 kg)  05/13/22 198 lb (89.8 kg)   BP Readings from Last 3 Encounters:  06/01/22 (!) 88/60  05/22/22 (!) 155/98  05/13/22 112/60         Past Medical History:  Diagnosis Date   Abdominal pain, epigastric    Allergic rhinitis, cause unspecified    Arthritis    BPH (benign prostatic hyperplasia)    CHF (congestive heart failure) (HCC)    Chronic anticoagulation    xarelto   Coronary artery calcification seen on CAT scan 12/26/2012   Depressive disorder, not elsewhere classified    Diverticulosis of colon (without mention of hemorrhage)    Esophageal reflux    High risk medication use     Hyperlipidemia    Hyperplastic rectal polyp    Hypertension    Lumbago    Pacemaker    Paralyzed hemidiaphragm    LEFT   Persistent atrial fibrillation (HCC)    Sick sinus syndrome (HCC) 05/16/2009   s/p PPM ([employer]) by Fawn Kirk   Past Surgical History:  Procedure Laterality Date   CYSTOSCOPY WITH RETROGRADE PYELOGRAM, URETEROSCOPY AND STENT PLACEMENT Left 12/30/2021   Procedure: CYSTOSCOPY WITH RETROGRADE PYELOGRAM, URETEROSCOPY;  Surgeon: Belva Agee, MD;  Location: [facility];  Service: Urology;  Laterality: Left;   ESOPHAGOGASTRODUODENOSCOPY N/A 12/05/2013   Procedure: ESOPHAGOGASTRODUODENOSCOPY (EGD);  Surgeon: Hart Carwin, MD;  Location: [facility];  Service: Endoscopy;  Laterality: N/A;   ESOPHAGOGASTRODUODENOSCOPY N/A 07/04/2015   Procedure: ESOPHAGOGASTRODUODENOSCOPY (EGD);  Surgeon: Louis Meckel, MD;  Location: [facility];  Service: Endoscopy;  Laterality: N/A;   EXTRACORPOREAL SHOCK WAVE LITHOTRIPSY Left 10/13/2021   Procedure: EXTRACORPOREAL SHOCK WAVE LITHOTRIPSY (ESWL);  Surgeon: Crista Elliot, MD;  Location: [facility];  Service: Urology;  Laterality: Left;  90 MINS   INSERT / REPLACE / REMOVE PACEMAKER  05/2009    [employer] ALTRUA 60  (serial number [id number]) dual-chamber pacemaker   LEAD REVISION/REPAIR N/A 10/15/2020   Procedure: LEAD REVISION/REPAIR;  Surgeon: Hillis Range, MD;  Location: [facility] INVASIVE CV LAB;  Service: Cardiovascular;  Laterality: N/A;   PACEMAKER PLACEMENT Bilateral    '05   PPM GENERATOR CHANGEOUT N/A 10/15/2020   Procedure: PPM GENERATOR CHANGEOUT;  Surgeon: Hillis Range, MD;  Location: [facility] INVASIVE CV LAB;  Service: Cardiovascular;  Laterality: N/A;    reports that he quit smoking about 30 years ago. His smoking use included cigarettes. He has a 10.00 pack-year smoking history. He has quit using smokeless tobacco.  His smokeless tobacco use included chew. He reports that he does not drink  alcohol and does not use drugs. family history includes Arthritis in his paternal grandmother; Asthma in his sister; Cancer in his father; Diabetes in his mother; Hypertension in his brother, daughter, father, mother, sister, and son; Lung cancer in his father. No Known Allergies Current Outpatient Medications on File Prior to Visit  Medication Sig Dispense Refill   acetaminophen (TYLENOL) 650 MG CR tablet Take 650 mg by mouth in the morning and at bedtime.     albuterol (VENTOLIN HFA) 108 (90 Base) MCG/ACT inhaler Inhale 2 puffs into the lungs every 6 (six) hours as needed for wheezing or shortness of breath. 8 g 6   amLODipine (NORVASC) 10 MG tablet Take 1 tablet (10 mg total) by mouth at bedtime. 90 tablet 3   atorvastatin (LIPITOR) 80 MG tablet TAKE 1 TABLET BY MOUTH EVERY DAY (Patient taking differently: Take 80 mg by mouth daily.) 90 tablet 3   Budeson-Glycopyrrol-Formoterol (BREZTRI AEROSPHERE) 160-9-4.8 MCG/ACT AERO Inhale into the lungs.     Cholecalciferol (VITAMIN D3) 50 MCG (2000 UT) TABS Take 2,000 Units by mouth daily.     clobetasol cream (TEMOVATE) 0.05 % APPLY TO AFFECTED AREA TWICE A DAY (Patient taking differently: Apply 1 Application topically daily as needed (itching).) 30 g 2   diclofenac Sodium (VOLTAREN) 1 % GEL Apply 2 g topically daily as needed (pain).     dorzolamide-timolol (COSOPT) 22.3-6.8 MG/ML ophthalmic solution Place 1 drop into both eyes in the morning and at bedtime.     fexofenadine (ALLEGRA) 180 MG tablet TAKE 1 TABLET (180 MG TOTAL) BY MOUTH DAILY. (Patient taking differently: Take 180 mg by mouth daily.) 90 tablet 1   flecainide (TAMBOCOR) 100 MG tablet TAKE 1 TABLET BY MOUTH TWICE A DAY (Patient taking differently: Take 100 mg by mouth 2 (two) times daily.) 180 tablet 3   furosemide (LASIX) 20 MG tablet TAKE 1 TABLET IF WEIGHT GAIN IS > 3LBS OVERNIGHT OR >5LBS IN A WEEK NO MORE THAN 2 TABS WEEKLY (Patient taking differently: Take 20 mg by mouth 2 (two) times  daily.) 90 tablet 1   ketoconazole (NIZORAL) 2 % cream Apply 1 application topically daily. (Patient taking differently: Apply 1 application  topically daily as needed for irritation.) 15 g 0   ketoconazole (NIZORAL) 2 % shampoo Apply 1 Application topically See admin instructions. Two times weekly as needed for irritation     latanoprost (XALATAN) 0.005 % ophthalmic solution Place 1 drop into both eyes at bedtime.      meclizine (ANTIVERT) 12.5 MG tablet 1 TAB BY MOUTH EVERY 8 HRS AS NEEDED (Patient taking differently: Take 12.5 mg by mouth 3 (three) times daily as needed for dizziness.) 30 tablet 5   metoprolol succinate (TOPROL XL) 25 MG 24 hr tablet Take 1 tablet (25 mg total) by mouth at bedtime. (Patient taking differently: Take 25 mg by mouth daily.) 90 tablet 2   omeprazole (PRILOSEC) 40  MG capsule TAKE 1 CAPSULE BY MOUTH EVERY DAY AS NEEDED (Patient taking differently: Take 40 mg by mouth daily.) 90 capsule 2   psyllium (METAMUCIL) 0.52 g capsule Take 2 capsules by mouth daily.     rivaroxaban (XARELTO) 20 MG TABS tablet TAKE 1 TABLET BY MOUTH DAILY WITH SUPPER. (Patient taking differently: Take 20 mg by mouth daily with supper.) 90 tablet 3   tamsulosin (FLOMAX) 0.4 MG CAPS capsule TAKE 1 CAPSULE BY MOUTH TWICE A DAY 180 capsule 3   telmisartan (MICARDIS) 20 MG tablet Take 1 tablet (20 mg total) by mouth at bedtime. (Patient taking differently: Take 20 mg by mouth daily.) 90 tablet 2   traMADol (ULTRAM) 50 MG tablet TAKE 1 TABLET BY MOUTH EVERY 6 HOURS AS NEEDED. FOR PAIN (Patient taking differently: Take 50 mg by mouth every 6 (six) hours as needed for severe pain.) 120 tablet 2   Fluticasone-Umeclidin-Vilant (TRELEGY ELLIPTA) 100-62.5-25 MCG/ACT AEPB Inhale 1 puff into the lungs daily. (Patient not taking: Reported on 06/01/2022) 192.85 each 2   Fluticasone-Umeclidin-Vilant (TRELEGY ELLIPTA) 100-62.5-25 MCG/ACT AEPB Inhale 1 puff into the lungs daily. (Patient not taking: Reported on  05/19/2022) 60 each 0   oxybutynin (DITROPAN-XL) 10 MG 24 hr tablet TAKE 1 TABLET (10 MG TOTAL) BY MOUTH DAILY. (Patient not taking: Reported on 05/19/2022) 90 tablet 1   No current facility-administered medications on file prior to visit.        ROS:  All others reviewed and negative.  Objective        PE:  BP (!) 88/60 (BP Location: Right Arm, Patient Position: Sitting, Cuff Size: Normal)   Pulse 60   Temp 98.6 F (37 C) (Oral)   Ht 5\' 9"  (1.753 m)   Wt 193 lb (87.5 kg)   SpO2 97%   BMI 28.50 kg/m                 Constitutional: Pt appears in NAD, fatigued, non toxic               HENT: Head: NCAT.                Right Ear: External ear normal.                 Left Ear: External ear normal.                Eyes: . Pupils are equal, round, and reactive to light. Conjunctivae and EOM are normal               Nose: without d/c or deformity               Neck: Neck supple. Gross normal ROM               Cardiovascular: Normal rate and regular rhythm.                 Pulmonary/Chest: Effort normal and breath sounds without rales or wheezing. But with decreased BS left base               Abd:  Soft, NT, ND, + BS, no organomegaly               Neurological: Pt is alert. At baseline orientation, motor grossly intact               Skin: Skin is warm. No rashes, no other new lesions, LE edema - none, but has mild right calf tender  Psychiatric: Pt behavior is normal without agitation   Micro: none  Cardiac tracings I have personally interpreted today:  none  Pertinent Radiological findings (summarize): none   Lab Results  Component Value Date   WBC 7.9 06/01/2022   HGB 14.8 06/01/2022   HCT 44.2 06/01/2022   PLT 191.0 06/01/2022   GLUCOSE 125 (H) 06/01/2022   CHOL 163 06/01/2022   TRIG 72.0 06/01/2022   HDL 59.90 06/01/2022   LDLDIRECT 183.4 12/26/2013   LDLCALC 89 06/01/2022   ALT 64 (H) 06/01/2022   AST 40 (H) 06/01/2022   NA 133 (L) 06/01/2022   K 3.6  06/01/2022   CL 91 (L) 06/01/2022   CREATININE 1.46 06/01/2022   BUN 26 (H) 06/01/2022   CO2 29 06/01/2022   TSH 1.29 06/01/2022   PSA 2.01 11/24/2021   INR 2.2 (H) 12/09/2020   HGBA1C 6.6 (H) 06/01/2022   Assessment/Plan:  PONCE GAMARRA is a 81 y.o. Black or African American [2] male with  has a past medical history of Abdominal pain, epigastric, Allergic rhinitis, cause unspecified, Arthritis, BPH (benign prostatic hyperplasia), CHF (congestive heart failure) (HCC), Chronic anticoagulation, Coronary artery calcification seen on CAT scan (12/26/2012), Depressive disorder, not elsewhere classified, Diverticulosis of colon (without mention of hemorrhage), Esophageal reflux, High risk medication use, Hyperlipidemia, Hyperplastic rectal polyp, Hypertension, Lumbago, Pacemaker, Paralyzed hemidiaphragm, Persistent atrial fibrillation (HCC), and Sick sinus syndrome (HCC) (05/16/2009).  Pneumonia of left lower lobe due to infectious organism Clinically improved, has known left elevated hemidiaphragm, pt reqeusts f/u cxr today instead of waiting full 3 wks to reassess, for repeat cxr then, o/w also consider CT chest to assess for clearing  COPD GOLD III with marked reversibility  O/w stable, has f/u pulm sept 5 with PFTs  CHF (congestive heart failure) (HCC) Volume appears stable, despite mild increased po fluids, continue to follow  Shortness of breath Also for f/u cxr today  Pain and swelling of right lower leg ? msk but cant r/o dvt - for stat venous doppler,  to f/u any worsening symptoms or concerns  Hyperglycemia Lab Results  Component Value Date   HGBA1C 6.6 (H) 06/01/2022   Stable, pt to continue current medical treatment  - diet   Essential hypertension Low normal today, to HOLD the amlodipine 10 mg, continue mild increased po fluids, o/w apears asymptomatic except for fatigue  Followup: Return in about 2 weeks (around 06/15/2022).  Oliver Barre, MD 06/03/2022 9:02 PM [facility]

## 2022-06-03 ENCOUNTER — Encounter: Payer: Self-pay | Admitting: Internal Medicine

## 2022-06-03 NOTE — Assessment & Plan Note (Signed)
Volume appears stable, despite mild increased po fluids, continue to follow

## 2022-06-03 NOTE — Assessment & Plan Note (Signed)
?   msk but cant r/o dvt - for stat venous doppler,  to f/u any worsening symptoms or concerns

## 2022-06-03 NOTE — Assessment & Plan Note (Addendum)
Clinically improved, has known left elevated hemidiaphragm, pt reqeusts f/u cxr today instead of waiting full 3 wks to reassess, for repeat cxr then, o/w also consider CT chest to assess for clearing

## 2022-06-03 NOTE — Assessment & Plan Note (Signed)
Low normal today, to HOLD the amlodipine 10 mg, continue mild increased po fluids, o/w apears asymptomatic except for fatigue

## 2022-06-03 NOTE — Assessment & Plan Note (Signed)
Lab Results  Component Value Date   HGBA1C 6.6 (H) 06/01/2022   Stable, pt to continue current medical treatment  - diet

## 2022-06-03 NOTE — Assessment & Plan Note (Signed)
Also for f/u cxr today

## 2022-06-03 NOTE — Telephone Encounter (Signed)
Ok to continue to [facility] on taking the amlodipine, and at this point we have to also stop the micardis AND the metoprolol  Drink more fluids, and go to UC or ED if dizzy , falls or any other unsual symptoms  We can hold on further antibiotic for now

## 2022-06-03 NOTE — Assessment & Plan Note (Addendum)
O/w stable, has f/u pulm sept 5 with PFTs

## 2022-06-04 ENCOUNTER — Ambulatory Visit (HOSPITAL_COMMUNITY)
Admission: RE | Admit: 2022-06-04 | Discharge: 2022-06-04 | Disposition: A | Discharge status: Home / Self Care | Payer: Medicare Other | Source: Ambulatory Visit | Attending: Internal Medicine | Admitting: Internal Medicine

## 2022-06-04 DIAGNOSIS — M7989 Other specified soft tissue disorders: Secondary | ICD-10-CM | POA: Diagnosis not present

## 2022-06-04 DIAGNOSIS — M79661 Pain in right lower leg: Secondary | ICD-10-CM | POA: Insufficient documentation

## 2022-06-11 ENCOUNTER — Encounter (HOSPITAL_BASED_OUTPATIENT_CLINIC_OR_DEPARTMENT_OTHER): Payer: Medicare Other | Admitting: Pulmonary Disease

## 2022-06-16 ENCOUNTER — Other Ambulatory Visit: Payer: Self-pay | Admitting: Internal Medicine

## 2022-06-17 ENCOUNTER — Other Ambulatory Visit: Payer: Self-pay | Admitting: Internal Medicine

## 2022-06-17 ENCOUNTER — Encounter: Payer: Self-pay | Admitting: Internal Medicine

## 2022-06-17 ENCOUNTER — Ambulatory Visit (INDEPENDENT_AMBULATORY_CARE_PROVIDER_SITE_OTHER): Payer: Medicare Other

## 2022-06-17 ENCOUNTER — Ambulatory Visit (INDEPENDENT_AMBULATORY_CARE_PROVIDER_SITE_OTHER): Payer: Medicare Other | Admitting: Internal Medicine

## 2022-06-17 VITALS — BP 138/86 | HR 64 | Temp 98.1°F | Ht 69.0 in | Wt 199.8 lb

## 2022-06-17 DIAGNOSIS — R9389 Abnormal findings on diagnostic imaging of other specified body structures: Secondary | ICD-10-CM

## 2022-06-17 DIAGNOSIS — I5022 Chronic systolic (congestive) heart failure: Secondary | ICD-10-CM | POA: Diagnosis not present

## 2022-06-17 DIAGNOSIS — J189 Pneumonia, unspecified organism: Secondary | ICD-10-CM | POA: Diagnosis not present

## 2022-06-17 DIAGNOSIS — I2510 Atherosclerotic heart disease of native coronary artery without angina pectoris: Secondary | ICD-10-CM | POA: Diagnosis not present

## 2022-06-17 DIAGNOSIS — J9811 Atelectasis: Secondary | ICD-10-CM | POA: Diagnosis not present

## 2022-06-17 DIAGNOSIS — J449 Chronic obstructive pulmonary disease, unspecified: Secondary | ICD-10-CM

## 2022-06-17 DIAGNOSIS — I10 Essential (primary) hypertension: Secondary | ICD-10-CM | POA: Diagnosis not present

## 2022-06-17 MED ORDER — BREZTRI AEROSPHERE 160-9-4.8 MCG/ACT IN AERO
INHALATION_SPRAY | RESPIRATORY_TRACT | 11 refills | Status: DC
Start: 2022-06-17 — End: 2023-05-03

## 2022-06-17 NOTE — Progress Notes (Unsigned)
Patient ID: [name], male   DOB: 1941-08-18, 81 y.o.   MRN: [id number]        Chief Complaint: follow up HTN, HLD and hyperglycemia ***       HPI:  [name] is a 81 y.o. male here with c/o        Wt Readings from Last 3 Encounters:  06/17/22 199 lb 12.8 oz (90.6 kg)  06/01/22 193 lb (87.5 kg)  05/22/22 198 lb 10.2 oz (90.1 kg)   BP Readings from Last 3 Encounters:  06/17/22 138/86  06/01/22 (!) 88/60  05/22/22 (!) 155/98         Past Medical History:  Diagnosis Date   Abdominal pain, epigastric    Allergic rhinitis, cause unspecified    Arthritis    BPH (benign prostatic hyperplasia)    CHF (congestive heart failure) (HCC)    Chronic anticoagulation    xarelto   Coronary artery calcification seen on CAT scan 12/26/2012   Depressive disorder, not elsewhere classified    Diverticulosis of colon (without mention of hemorrhage)    Esophageal reflux    High risk medication use    Hyperlipidemia    Hyperplastic rectal polyp    Hypertension    Lumbago    Pacemaker    Paralyzed hemidiaphragm    LEFT   Persistent atrial fibrillation (HCC)    Sick sinus syndrome ([place]) 05/16/2009   s/p PPM ([employer]) by [name]   Past Surgical History:  Procedure Laterality Date   CYSTOSCOPY WITH RETROGRADE PYELOGRAM, URETEROSCOPY AND STENT PLACEMENT Left 12/30/2021   Procedure: CYSTOSCOPY WITH RETROGRADE PYELOGRAM, URETEROSCOPY;  Surgeon: Remi Haggard, MD;  Location: [facility];  Service: Urology;  Laterality: Left;   ESOPHAGOGASTRODUODENOSCOPY N/A 12/05/2013   Procedure: ESOPHAGOGASTRODUODENOSCOPY (EGD);  Surgeon: [name], MD;  Location: [facility];  Service: Endoscopy;  Laterality: N/A;   ESOPHAGOGASTRODUODENOSCOPY N/A 07/04/2015   Procedure: ESOPHAGOGASTRODUODENOSCOPY (EGD);  Surgeon: [name], MD;  Location: [facility];  Service: Endoscopy;  Laterality: N/A;   EXTRACORPOREAL SHOCK WAVE LITHOTRIPSY Left 10/13/2021   Procedure: EXTRACORPOREAL  SHOCK WAVE LITHOTRIPSY (ESWL);  Surgeon: Lucas Mallow, MD;  Location: [facility];  Service: Urology;  Laterality: Left;  90 MINS   INSERT / REPLACE / REMOVE PACEMAKER  05/2009    [employer] [id number]  (serial number [id number]) dual-chamber pacemaker   LEAD REVISION/REPAIR N/A 10/15/2020   Procedure: LEAD REVISION/REPAIR;  Surgeon: Thompson Grayer, MD;  Location: [facility];  Service: Cardiovascular;  Laterality: N/A;   PACEMAKER PLACEMENT Bilateral    '05   PPM GENERATOR CHANGEOUT N/A 10/15/2020   Procedure: PPM GENERATOR CHANGEOUT;  Surgeon: Thompson Grayer, MD;  Location: [place] [facility];  Service: Cardiovascular;  Laterality: N/A;    reports that he quit smoking about 30 years ago. His smoking use included cigarettes. He has a 10.00 pack-year smoking history. He has quit using smokeless tobacco.  His smokeless tobacco use included chew. He reports that he does not drink alcohol and does not use drugs. family history includes Arthritis in his paternal grandmother; Asthma in his sister; Cancer in his father; Diabetes in his mother; Hypertension in his brother, daughter, father, mother, sister, and son; Lung cancer in his father. No Known Allergies Current Outpatient Medications on File Prior to Visit  Medication Sig Dispense Refill   acetaminophen (TYLENOL) 650 MG CR tablet Take 650 mg by mouth in the morning and at bedtime.  atorvastatin (LIPITOR) 80 MG tablet TAKE 1 TABLET BY MOUTH EVERY DAY (Patient taking differently: Take 80 mg by mouth daily.) 90 tablet 3   Budeson-Glycopyrrol-Formoterol (BREZTRI AEROSPHERE) 160-9-4.8 MCG/ACT AERO Inhale into the lungs.     Cholecalciferol (VITAMIN D3) 50 MCG (2000 UT) TABS Take 2,000 Units by mouth daily.     dorzolamide-timolol (COSOPT) 22.3-6.8 MG/ML ophthalmic solution Place 1 drop into both eyes in the morning and at bedtime.     fexofenadine (ALLEGRA) 180 MG tablet TAKE 1 TABLET (180 MG TOTAL) BY MOUTH DAILY.  (Patient taking differently: Take 180 mg by mouth daily.) 90 tablet 1   flecainide (TAMBOCOR) 100 MG tablet TAKE 1 TABLET BY MOUTH TWICE A DAY (Patient taking differently: Take 100 mg by mouth 2 (two) times daily.) 180 tablet 3   furosemide (LASIX) 20 MG tablet TAKE 1 TABLET IF WEIGHT GAIN IS > 3LBS OVERNIGHT OR >5LBS IN A WEEK NO MORE THAN 2 TABS WEEKLY (Patient taking differently: Take 20 mg by mouth 2 (two) times daily.) 90 tablet 1   latanoprost (XALATAN) 0.005 % ophthalmic solution Place 1 drop into both eyes at bedtime.      meclizine (ANTIVERT) 12.5 MG tablet 1 TAB BY MOUTH EVERY 8 HRS AS NEEDED (Patient taking differently: Take 12.5 mg by mouth 3 (three) times daily as needed for dizziness.) 30 tablet 5   omeprazole (PRILOSEC) 40 MG capsule TAKE 1 CAPSULE BY MOUTH EVERY DAY AS NEEDED (Patient taking differently: Take 40 mg by mouth daily.) 90 capsule 2   psyllium (METAMUCIL) 0.52 g capsule Take 2 capsules by mouth daily.     rivaroxaban (XARELTO) 20 MG TABS tablet TAKE 1 TABLET BY MOUTH DAILY WITH SUPPER. (Patient taking differently: Take 20 mg by mouth daily with supper.) 90 tablet 3   tamsulosin (FLOMAX) 0.4 MG CAPS capsule TAKE 1 CAPSULE BY MOUTH TWICE A DAY 180 capsule 3   traMADol (ULTRAM) 50 MG tablet TAKE 1 TABLET BY MOUTH EVERY 6 HOURS AS NEEDED. FOR PAIN (Patient taking differently: Take 50 mg by mouth every 6 (six) hours as needed for severe pain.) 120 tablet 2   amLODipine (NORVASC) 10 MG tablet Take 1 tablet (10 mg total) by mouth at bedtime. (Patient not taking: Reported on 06/17/2022) 90 tablet 3   metoprolol succinate (TOPROL XL) 25 MG 24 hr tablet Take 1 tablet (25 mg total) by mouth at bedtime. (Patient not taking: Reported on 06/17/2022) 90 tablet 2   oxybutynin (DITROPAN-XL) 10 MG 24 hr tablet TAKE 1 TABLET (10 MG TOTAL) BY MOUTH DAILY. (Patient not taking: Reported on 06/17/2022) 90 tablet 1   telmisartan (MICARDIS) 20 MG tablet Take 1 tablet (20 mg total) by mouth at bedtime.  (Patient not taking: Reported on 06/17/2022) 90 tablet 2   No current facility-administered medications on file prior to visit.        ROS:  All others reviewed and negative.  Objective        PE:  BP 138/86 (BP Location: Right Arm, Patient Position: Sitting, Cuff Size: Normal)   Pulse 64   Temp 98.1 F (36.7 C) (Oral)   Ht '5\' 9"'$  (1.753 m)   Wt 199 lb 12.8 oz (90.6 kg)   SpO2 98%   BMI 29.51 kg/m                 Constitutional: Pt appears in NAD               HENT: Head: NCAT.  Right Ear: External ear normal.                 Left Ear: External ear normal.                Eyes: . Pupils are equal, round, and reactive to light. Conjunctivae and EOM are normal               Nose: without d/c or deformity               Neck: Neck supple. Gross normal ROM               Cardiovascular: Normal rate and regular rhythm.                 Pulmonary/Chest: Effort normal and breath sounds without rales or wheezing.                Abd:  Soft, NT, ND, + BS, no organomegaly               Neurological: Pt is alert. At baseline orientation, motor grossly intact               Skin: Skin is warm. No rashes, no other new lesions, LE edema - ***               Psychiatric: Pt behavior is normal without agitation   Micro: none  Cardiac tracings I have personally interpreted today:  none  Pertinent Radiological findings (summarize): none   Lab Results  Component Value Date   WBC 7.9 06/01/2022   HGB 14.8 06/01/2022   HCT 44.2 06/01/2022   PLT 191.0 06/01/2022   GLUCOSE 125 (H) 06/01/2022   CHOL 163 06/01/2022   TRIG 72.0 06/01/2022   HDL 59.90 06/01/2022   LDLDIRECT 183.4 12/26/2013   LDLCALC 89 06/01/2022   ALT 64 (H) 06/01/2022   AST 40 (H) 06/01/2022   NA 133 (L) 06/01/2022   K 3.6 06/01/2022   CL 91 (L) 06/01/2022   CREATININE 1.46 06/01/2022   BUN 26 (H) 06/01/2022   CO2 29 06/01/2022   TSH 1.29 06/01/2022   PSA 2.01 11/24/2021   INR 2.2 (H) 12/09/2020   HGBA1C 6.6  (H) 06/01/2022   Assessment/Plan:  SOPHEAP BOEHLE is a 81 y.o. Black or African American [2] male with  has a past medical history of Abdominal pain, epigastric, Allergic rhinitis, cause unspecified, Arthritis, BPH (benign prostatic hyperplasia), CHF (congestive heart failure) ([place]), Chronic anticoagulation, Coronary artery calcification seen on CAT scan (12/26/2012), Depressive disorder, not elsewhere classified, Diverticulosis of colon (without mention of hemorrhage), Esophageal reflux, High risk medication use, Hyperlipidemia, Hyperplastic rectal polyp, Hypertension, Lumbago, Pacemaker, Paralyzed hemidiaphragm, Persistent atrial fibrillation ([place]), and Sick sinus syndrome ([place]) (05/16/2009).  No problem-specific Assessment & Plan notes found for this encounter.  Followup: No follow-ups on file.  Cathlean Cower, MD 06/17/2022 10:48 AM [facility]

## 2022-06-17 NOTE — Patient Instructions (Addendum)
Ok to restart the micardis (telmisartan) tomorrow  Please restart the metoprolol in 1 week  In one more week after that, please monitor your BP, and if your BP is at least several times more the 140 for the top number, you can restart the amlodipine  Ok to STOP the HCT fluid pill since you are taking the furosemide (lasix)  No further antibiotic needed today  Please continue all other medications as before, and refills have been done if requested.  Please have the pharmacy call with any other refills you may need.  Please keep your appointments with your specialists as you may have planned - Pulmonary and Cardiology in early Sept 2023  Please go to the XRAY Department in the first floor for the x-ray testing; and we may need CT chest if the xray is not completely cleared up  You will be contacted by phone if any changes need to be made immediately.  Otherwise, you will receive a letter about your results with an explanation, but please check with MyChart first.  Please remember to sign up for MyChart if you have not done so, as this will be important to you in the future with finding out test results, communicating by private email, and scheduling acute appointments online when needed.

## 2022-06-20 ENCOUNTER — Encounter: Payer: Self-pay | Admitting: Internal Medicine

## 2022-06-20 ENCOUNTER — Other Ambulatory Visit: Payer: Self-pay | Admitting: Internal Medicine

## 2022-06-20 NOTE — Assessment & Plan Note (Signed)
O/w stable, cont current inhaler prn

## 2022-06-20 NOTE — Assessment & Plan Note (Signed)
No evidence for volume overload,continue to monitor

## 2022-06-20 NOTE — Assessment & Plan Note (Signed)
Improving post hospn with lower BP and 3 antiBP meds held; ok to restart the telmisartan now, then toprol xl in 1 wk if SBP stable, then consider restart amlodipine in another week for SBP > 140

## 2022-06-20 NOTE — Assessment & Plan Note (Signed)
Clinically resolved with minor residual non prod cough - for f/u cxr, and if persistently abnormal will need to consider CT chest

## 2022-07-02 ENCOUNTER — Encounter (HOSPITAL_BASED_OUTPATIENT_CLINIC_OR_DEPARTMENT_OTHER): Payer: Medicare Other | Admitting: Pulmonary Disease

## 2022-07-06 DIAGNOSIS — N200 Calculus of kidney: Secondary | ICD-10-CM | POA: Diagnosis not present

## 2022-07-13 ENCOUNTER — Ambulatory Visit: Payer: Medicare Other

## 2022-07-13 NOTE — Progress Notes (Unsigned)
Initial neurology clinic note  [name] MRN: [id number] DOB: 1941/01/30  Referring provider: Biagio Borg, MD  Primary care provider: Biagio Borg, MD  Reason for consult:  Dizziness  Subjective:  This is [name], a 81 y.o. 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 The patient is accompanied by wife, [name].  Patient describes dizziness that feels like things are spinning around him. It has been present for 2-3 years. It occurs when he turns his head quickly or when he is in bed when he turns over. The sensation passes if he is still. Patient started on meclizine for dizziness, which has improved things. He last had an episode of dizziness a few months ago. He previously tried to wean his meclizine from 2 times daily to once daily, but the sensations came back.  He also mentions that when he first stands up, he is off balance. If he stands up too fast, he feels like he is going to "fall out." If he gives himself a minute, he feels better. He had a near fall in the last month, but denies syncope.  Patient denies impaired sweating, excessive mucosal dryness, early satiety, constipation (on metamucil for years). He endorses some postprandial abdominal bloating and urinary frequency due to water pills.  There are no complaints relating to other symptoms of small fiber modalities including paresthesia/pain.  The patient has not noticed any recent skin rashes nor does he report any constitutional symptoms like fever, night sweats, anorexia or unintentional weight loss.  EtOH use: None  Restrictive diet? No Family history of neuropathy/myopathy/NM disease? no   MEDICATIONS:  Outpatient Encounter Medications as of 07/15/2022  Medication Sig   acetaminophen (TYLENOL) 650  MG CR tablet Take 650 mg by mouth in the morning and at bedtime. Takes two time daily   atorvastatin (LIPITOR) 80 MG tablet TAKE 1 TABLET BY MOUTH EVERY DAY (Patient taking differently: Take 80 mg by mouth daily.)   Budeson-Glycopyrrol-Formoterol (BREZTRI AEROSPHERE) 160-9-4.8 MCG/ACT AERO 1 puffs twice per day   Cholecalciferol (VITAMIN D3) 50 MCG (2000 UT) TABS Take 2,000 Units by mouth daily.   dorzolamide-timolol (COSOPT) 22.3-6.8 MG/ML ophthalmic solution Place 1 drop into both eyes in the morning and at bedtime.   fexofenadine (ALLEGRA) 180 MG tablet TAKE 1 TABLET (180 MG TOTAL) BY MOUTH DAILY. (Patient taking differently: Take 180 mg by mouth daily.)   flecainide (TAMBOCOR) 100 MG tablet TAKE 1 TABLET BY MOUTH TWICE A DAY (Patient taking differently: Take 100 mg by mouth 2 (two) times daily.)   furosemide (LASIX) 20 MG tablet TAKE 1 TABLET IF WEIGHT GAIN IS > 3LBS OVERNIGHT OR >5LBS IN A WEEK NO MORE THAN 2 TABS WEEKLY (Patient taking differently: Take 20 mg by mouth 2 (two) times daily.)   latanoprost (XALATAN) 0.005 % ophthalmic solution Place 1 drop into both eyes at bedtime.    meclizine (ANTIVERT) 12.5 MG tablet TAKE 1 TABLET EVERY 8 HOURS AS NEEDED   metoprolol succinate (TOPROL XL) 25 MG 24 hr tablet Take 1 tablet (25 mg total) by mouth at bedtime.   omeprazole (PRILOSEC) 40 MG capsule TAKE 1 CAPSULE BY MOUTH EVERY DAY AS NEEDED (Patient taking differently: Take 40 mg by mouth daily.)   psyllium (METAMUCIL) 0.52 g capsule Take 2 capsules by mouth daily.   rivaroxaban (  XARELTO) 20 MG TABS tablet TAKE 1 TABLET BY MOUTH DAILY WITH SUPPER. (Patient taking differently: Take 20 mg by mouth daily with supper.)   tamsulosin (FLOMAX) 0.4 MG CAPS capsule TAKE 1 CAPSULE BY MOUTH TWICE A DAY   telmisartan (MICARDIS) 20 MG tablet Take 1 tablet (20 mg total) by mouth at bedtime.   traMADol (ULTRAM) 50 MG tablet TAKE 1 TABLET BY MOUTH EVERY 6 HOURS AS NEEDED. FOR PAIN (Patient taking differently: Take  50 mg by mouth every 6 (six) hours as needed for severe pain.)   amLODipine (NORVASC) 10 MG tablet Take 1 tablet (10 mg total) by mouth at bedtime. (Patient not taking: Reported on 06/17/2022)   oxybutynin (DITROPAN-XL) 10 MG 24 hr tablet TAKE 1 TABLET (10 MG TOTAL) BY MOUTH DAILY. (Patient not taking: Reported on 06/17/2022)   No facility-administered encounter medications on file as of 07/15/2022.    PAST MEDICAL HISTORY: Past Medical History:  Diagnosis Date   Abdominal pain, epigastric    Allergic rhinitis, cause unspecified    Arthritis    BPH (benign prostatic hyperplasia)    CHF (congestive heart failure) (HCC)    Chronic anticoagulation    xarelto   Coronary artery calcification seen on CAT scan 12/26/2012   Depressive disorder, not elsewhere classified    Diverticulosis of colon (without mention of hemorrhage)    Esophageal reflux    High risk medication use    Hyperlipidemia    Hyperplastic rectal polyp    Hypertension    Lumbago    Pacemaker    Paralyzed hemidiaphragm    LEFT   Persistent atrial fibrillation (HCC)    Sick sinus syndrome ([place]) 05/16/2009   s/p PPM ([employer]) by [name]    PAST SURGICAL HISTORY: Past Surgical History:  Procedure Laterality Date   CYSTOSCOPY WITH RETROGRADE PYELOGRAM, URETEROSCOPY AND STENT PLACEMENT Left 12/30/2021   Procedure: CYSTOSCOPY WITH RETROGRADE PYELOGRAM, URETEROSCOPY;  Surgeon: Remi Haggard, MD;  Location: [facility];  Service: Urology;  Laterality: Left;   ESOPHAGOGASTRODUODENOSCOPY N/A 12/05/2013   Procedure: ESOPHAGOGASTRODUODENOSCOPY (EGD);  Surgeon: [name], MD;  Location: [facility];  Service: Endoscopy;  Laterality: N/A;   ESOPHAGOGASTRODUODENOSCOPY N/A 07/04/2015   Procedure: ESOPHAGOGASTRODUODENOSCOPY (EGD);  Surgeon: [name], MD;  Location: [facility];  Service: Endoscopy;  Laterality: N/A;   EXTRACORPOREAL SHOCK WAVE LITHOTRIPSY Left 10/13/2021   Procedure: EXTRACORPOREAL  SHOCK WAVE LITHOTRIPSY (ESWL);  Surgeon: Lucas Mallow, MD;  Location: [facility];  Service: Urology;  Laterality: Left;  90 MINS   INSERT / REPLACE / REMOVE PACEMAKER  05/2009    [employer] [id number]  (serial number [id number]) dual-chamber pacemaker   LEAD REVISION/REPAIR N/A 10/15/2020   Procedure: LEAD REVISION/REPAIR;  Surgeon: Thompson Grayer, MD;  Location: [facility];  Service: Cardiovascular;  Laterality: N/A;   PACEMAKER PLACEMENT Bilateral    '05   PPM GENERATOR CHANGEOUT N/A 10/15/2020   Procedure: PPM GENERATOR CHANGEOUT;  Surgeon: Thompson Grayer, MD;  Location: [facility];  Service: Cardiovascular;  Laterality: N/A;    ALLERGIES: No Known Allergies  FAMILY HISTORY: Family History  Problem Relation Age of Onset   Diabetes Mother    Hypertension Mother    Hypertension Father    Lung cancer Father    Cancer Father    Asthma Sister    Hypertension Sister    Arthritis Paternal Grandmother    Hypertension Brother    Hypertension Daughter  Hypertension Son     SOCIAL HISTORY: Social History   Tobacco Use   Smoking status: Former    Packs/day: 0.50    Years: 20.00    Total pack years: 10.00    Types: Cigarettes    Quit date: 11/17/1991    Years since quitting: 30.6   Smokeless tobacco: Former    Types: Nurse, children's Use: Never used  Substance Use Topics   Alcohol use: No   Drug use: No   Social History   Social History Narrative   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    Caffeine 1-2 day cups   Right handed   Tri-level home       Objective:  Vital Signs:  BP (!) 155/100   Pulse 62   Ht '5\' 9"'$  (1.753 m)   Wt 207 lb (93.9 kg)   SpO2 98%   BMI 30.57 kg/m   Orthostatic vitals: Sitting: 150/102, 62 Standing: 150/88, 61  General: General appearance: Awake and alert. No distress. Cooperative with exam.  Skin: No obvious rash or jaundice. HEENT: Atraumatic. Anicteric. Lungs: Non-labored breathing on room  air  Heart: Regular Musculoskeletal: No obvious joint swelling. Psych: Affect appropriate.  Neurological: Mental Status: Alert. Speech fluent. No pseudobulbar affect Cranial Nerves: CNII: No RAPD. Visual fields intact. CNIII, IV, VI: PERRL. No nystagmus. EOMI. CN V: Facial sensation intact bilaterally to fine touch. CN VII: Facial muscles symmetric and strong. No ptosis at rest. CN VIII: Hears finger rub well bilaterally. CN IX: No hypophonia. CN X: Palate elevates symmetrically. CN XI: Full strength shoulder shrug bilaterally. CN XII: Tongue protrusion full and midline. No atrophy or fasciculations. No significant dysarthria Motor: Tone is mildly increased. No fasciculations in extremities. No atrophy.  Individual muscle group testing (MRC grade out of 5):  Movement     Neck flexion 5    Neck extension 5     Right Left   Shoulder abduction 4+ 4   Shoulder adduction 5 5   Shoulder ext rotation 4 4   Shoulder int rotation 5 5   Elbow flexion 5 5   Elbow extension 5 5   Wrist extension 5 5   Wrist flexion 5 5   Finger abduction - FDI 5 5   Finger abduction - ADM 5 5   Finger extension 5 5   Finger distal flexion - 2/'3 5 5   '$ Finger distal flexion - 4/'5 5 5   '$ Thumb flexion - FPL 5 5   Thumb abduction - APB 5 5    Hip flexion 5 5   Hip extension 5 5   Hip adduction 5 5   Hip abduction 5 5   Knee extension 5 5   Knee flexion 5 5   Dorsiflexion 5 5   Plantarflexion 5 5   Inversion 5 5   Eversion 5 5   Great toe extension 5 5   Great toe flexion 5 5     Reflexes:  Right Left   Bicep Trace Trace   Tricep Trace Trace   BrRad Trace Trace   Knee 2+ 2+   Ankle 0 0    Pathological Reflexes: Babinski: mute response bilaterally Hoffman: absent bilaterally Troemner: absent bilaterally  Sensation: Pinprick: Intact in all extremities Vibration: Absent in bilateral great toes, fully present at ankles and UEs Temperature: Intact in all  extremities Proprioception: Intact in bilateral great toes Coordination: Intact finger-to- nose-finger bilaterally. Romberg negative. Gait: Able to rise from  chair with arms crossed unassisted. Normal, narrow-based gait, though with reduced arm swing bilaterally. Able to walk on toes and heels.  Labs and Imaging review: Out-side paper records, electronic medical record, and images have been reviewed where available and summarized as:  Lab Results  Component Value Date   HGBA1C 6.6 (H) 06/01/2022   Lab Results  Component Value Date   VITAMINB12 389 11/24/2021   Lab Results  Component Value Date   TSH 1.29 06/01/2022   Lab Results  Component Value Date   ESRSEDRATE 10 12/26/2013   CT head (11/04/21): FINDINGS: Brain:   Mild generalized cerebral atrophy.   Mild patchy and ill-defined hypoattenuation within the cerebral white matter, nonspecific but compatible with chronic small vessel ischemic disease.   Unchanged dural calcifications along the falx.   There is no acute intracranial hemorrhage.   No demarcated cortical infarct.   No extra-axial fluid collection.   No evidence of an intracranial mass.   No midline shift.   Vascular: No hyperdense vessel. Atherosclerotic calcifications.   Skull: Normal. Negative for fracture or focal lesion.   Sinuses/Orbits: Visualized orbits show no acute finding. Redemonstrated chronic fracture deformity of the left orbital floor. No significant paranasal sinus disease at the imaged levels.   Other: Trace fluid within the right mastoid air cells.   IMPRESSION: No evidence of acute intracranial abnormality.   Mild chronic small-vessel ischemic changes within the cerebral white matter.   Trace fluid within the right mastoid air cells.  CT cervical spine (09/06/19): CT CERVICAL SPINE FINDINGS   Alignment: Straightening of the cervical spine. No subluxation. Anterior offset of the spinal laminar line. Widening of  the predental space up to 6 mm.   Skull base and vertebrae: Craniovertebral junction is intact. No fracture is seen.   Soft tissues and spinal canal: No prevertebral fluid collections. Narrowing of the canal at the C1-C2 articulation with mass effect on the anterior thecal sac.   Disc levels: Moderate-to-marked diffuse degenerative change C3 through C7 with disc space narrowing, vacuum disc and osteophytes. Multiple level facet degenerative change with resultant foraminal stenosis at multiple levels.   Upper chest: Lung apices are clear.  Carotid vascular calcification   Other: None   IMPRESSION: Straightening of the cervical spine with advanced degenerative change. No definitive fracture seen, however there is abnormal widening of the atlantodental interval, raising concern for ligamentous injury at this level given history of trauma.  Assessment/Plan:  [name] is a 81 y.o. male who presents for evaluation of vertigo when turning head or rolling over in bed and lightheadness and near syncope when standing. 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 He has some proximal muscle weakness in his upper extremities which patient attributes to arthritis. He has reduced reflexes and reduced sensation to vibration (in bilateral toes).   In terms of his lightheadedness upon standing, his orthostatic vitals showed a > 10 mmHg drop in diastolic pressures going from sitting to standing which is consistent with the diagnosis of orthostatic hypotension. There was no increased heart rate. The mostly likely cause of his orthostatic hypotension and lack of HR response are his BP meds and beta blocker.   His vertigo on turning in bed or quickly turning his head is likely secondary to benign paroxysmal positional vertigo (BPPV). We discussed  BPPV and the Epley maneuver for treatment, which I also  demonstrated. He has less episodes currently, but if this worsens, vestibular rehab can be considered.  Available diagnostic data is significant for recent HbA1c of 6.6. This could contribute to neuropathy, which I discussed with the patient today.  Pre-diabetes by recent HbA1c Orthostatic hypotension - BP meds and metoprolol BPPV  PLAN: -Blood work: IFE, SPEP -Epley discussed and demonstrated for BPPV. Will consider vestibular therapy if not improving or worsening -Okay to continue Meclizine, but can taper as able -Discussed orthostatic hypotension. Recommended hydration, compression stockings, and slow transitions. He should also discuss medications with cardiology and PCP, though he may need medications for his heart, which we discussed. -Will monitor proximal muscle weakness of upper extremities. This was not the purpose of today's visit, but if it does not improve or worsens, he may benefit from further investigation.  -Return to clinic in 6 months  The impression above as well as the plan as outlined below were extensively discussed with the patient (in the company of wife) who voiced understanding. All questions were answered to their satisfaction.  When available, results of the above investigations and possible further recommendations will be communicated to the patient via telephone/MyChart. Patient to call office if not contacted after expected testing turnaround time.   Total time spent reviewing records, interview, history/exam, documentation, and coordination of care on day of encounter:  65 min   Thank you for allowing me to participate in patient's care.  If I can answer any additional questions, I would be pleased to do so.  Kai Levins, MD   CC: Biagio Borg, [place] [address]  CC: Referring provider: Biagio Borg, MD [address] [place],  [place] [address]

## 2022-07-14 ENCOUNTER — Ambulatory Visit
Admission: RE | Admit: 2022-07-14 | Discharge: 2022-07-14 | Disposition: A | Discharge status: Home / Self Care | Payer: Medicare Other | Source: Ambulatory Visit | Attending: Internal Medicine | Admitting: Internal Medicine

## 2022-07-14 DIAGNOSIS — R9389 Abnormal findings on diagnostic imaging of other specified body structures: Secondary | ICD-10-CM

## 2022-07-14 DIAGNOSIS — K5730 Diverticulosis of large intestine without perforation or abscess without bleeding: Secondary | ICD-10-CM | POA: Diagnosis not present

## 2022-07-14 DIAGNOSIS — J9811 Atelectasis: Secondary | ICD-10-CM | POA: Diagnosis not present

## 2022-07-14 DIAGNOSIS — I2510 Atherosclerotic heart disease of native coronary artery without angina pectoris: Secondary | ICD-10-CM | POA: Diagnosis not present

## 2022-07-14 DIAGNOSIS — I700 Atherosclerosis of aorta: Secondary | ICD-10-CM | POA: Diagnosis not present

## 2022-07-14 LAB — CUP PACEART REMOTE DEVICE CHECK
Battery Remaining Longevity: 162 mo
Battery Remaining Percentage: 100 %
Brady Statistic RA Percent Paced: 99 %
Brady Statistic RV Percent Paced: 0 %
Date Time Interrogation Session: 20230829101400
Implantable Lead Implant Date: 20100719
Implantable Lead Implant Date: 20211130
Implantable Lead Location: 753859
Implantable Lead Location: 753860
Implantable Lead Model: 4136
Implantable Lead Model: 7842
Implantable Lead Serial Number: 1055910
Implantable Lead Serial Number: 28594836
Implantable Pulse Generator Implant Date: 20211130
Lead Channel Impedance Value: 594 Ohm
Lead Channel Impedance Value: 678 Ohm
Lead Channel Setting Pacing Amplitude: 2 V
Lead Channel Setting Pacing Amplitude: 2.5 V
Lead Channel Setting Pacing Pulse Width: 1 ms
Lead Channel Setting Sensing Sensitivity: 2.5 mV
Pulse Gen Serial Number: 951809

## 2022-07-14 MED ORDER — IOPAMIDOL (ISOVUE-300) INJECTION 61%
75.0000 mL | Freq: Once | INTRAVENOUS | Status: AC | PRN
  Administered 2022-07-14: 60 mL via INTRAVENOUS

## 2022-07-15 ENCOUNTER — Other Ambulatory Visit (INDEPENDENT_AMBULATORY_CARE_PROVIDER_SITE_OTHER): Payer: Medicare Other

## 2022-07-15 ENCOUNTER — Ambulatory Visit (INDEPENDENT_AMBULATORY_CARE_PROVIDER_SITE_OTHER): Payer: Medicare Other | Admitting: Neurology

## 2022-07-15 ENCOUNTER — Encounter: Payer: Self-pay | Admitting: Neurology

## 2022-07-15 VITALS — Ht 69.0 in | Wt 207.0 lb

## 2022-07-15 DIAGNOSIS — R42 Dizziness and giddiness: Secondary | ICD-10-CM | POA: Diagnosis not present

## 2022-07-15 DIAGNOSIS — R209 Unspecified disturbances of skin sensation: Secondary | ICD-10-CM

## 2022-07-15 DIAGNOSIS — H8110 Benign paroxysmal vertigo, unspecified ear: Secondary | ICD-10-CM

## 2022-07-15 DIAGNOSIS — I951 Orthostatic hypotension: Secondary | ICD-10-CM

## 2022-07-15 DIAGNOSIS — R7303 Prediabetes: Secondary | ICD-10-CM

## 2022-07-15 NOTE — Patient Instructions (Addendum)
I believe the cause of your dizziness when rolling over in bed or moving your head quickly is a condition called benign paroxysmal positional vertigo (BPPV). It is from tiny crystals in your inner ear. You can do the procedure below called the Epley maneuver when you feel symptoms to help move the crystal out of the way.  The lightheadedness you feel when you stand up is because your blood pressure is dropping when you stand. Some of the medications you are on for your heart are likely contributing to symptoms. You should talk to your cardiologist and primary care to make sure they are aware and watch your medications closely.  To help with the lightheadedness you can: -Wear compression stockings on both legs -Stand up slowly and make sure you are not lightheaded before moving! -Drink plenty of water each day.  Please also talk to your primary care doctor about your elevated sugar number (HbA1c) as diabetes can make nerve problems worse.  We will get blood work today.  Return to clinic in 6 months or sooner if needed.  Kai Levins, MD   Epley Maneuver when you have the dizziness when turning over in bed or turning your head: Can I do the Epley maneuver myself? Yes. You can perform the Epley maneuver at home on yourself. Sit on the edge of your bed and follow the steps outlined in the section above. For best results, you may need to perform the maneuver three times.  It's a good idea to ask your healthcare provider to demonstrate the Epley maneuver before you try it at home to ensure you're doing it properly. You should also ask them to confirm which ear is causing BPPV symptoms.  When do you do the Epley maneuver? While you can do the Epley maneuver anytime, many people prefer to do it just before they go to sleep. That way, if you have lingering vertigo symptoms after performing the procedure, you can sleep through them.  You should consult a healthcare provider before incorporating any  new therapy into your routine.  How long does the Epley maneuver take to work? Most people report relief from BPPV symptoms immediately following a canalith repositioning procedure. However, sometimes, you might have to repeat maneuvers in order to reduce your symptoms.

## 2022-07-21 ENCOUNTER — Ambulatory Visit (INDEPENDENT_AMBULATORY_CARE_PROVIDER_SITE_OTHER): Payer: Medicare Other

## 2022-07-21 ENCOUNTER — Ambulatory Visit (INDEPENDENT_AMBULATORY_CARE_PROVIDER_SITE_OTHER): Payer: Medicare Other | Admitting: Pulmonary Disease

## 2022-07-21 ENCOUNTER — Encounter: Payer: Self-pay | Admitting: Pulmonary Disease

## 2022-07-21 ENCOUNTER — Ambulatory Visit: Payer: Medicare Other | Admitting: Neurology

## 2022-07-21 VITALS — BP 104/70 | HR 60 | Temp 97.7°F | Ht 68.0 in | Wt 206.0 lb

## 2022-07-21 DIAGNOSIS — R0609 Other forms of dyspnea: Secondary | ICD-10-CM | POA: Diagnosis not present

## 2022-07-21 DIAGNOSIS — R0602 Shortness of breath: Secondary | ICD-10-CM | POA: Diagnosis not present

## 2022-07-21 DIAGNOSIS — J449 Chronic obstructive pulmonary disease, unspecified: Secondary | ICD-10-CM

## 2022-07-21 DIAGNOSIS — I495 Sick sinus syndrome: Secondary | ICD-10-CM

## 2022-07-21 LAB — CUP PACEART REMOTE DEVICE CHECK
Battery Remaining Longevity: 162 mo
Battery Remaining Percentage: 100 %
Brady Statistic RA Percent Paced: 99 %
Brady Statistic RV Percent Paced: 0 %
Date Time Interrogation Session: 20230904031700
Implantable Lead Implant Date: 20100719
Implantable Lead Implant Date: 20211130
Implantable Lead Location: 753859
Implantable Lead Location: 753860
Implantable Lead Model: 4136
Implantable Lead Model: 7842
Implantable Lead Serial Number: 1055910
Implantable Lead Serial Number: 28594836
Implantable Pulse Generator Implant Date: 20211130
Lead Channel Impedance Value: 604 Ohm
Lead Channel Impedance Value: 684 Ohm
Lead Channel Setting Pacing Amplitude: 2 V
Lead Channel Setting Pacing Amplitude: 2.5 V
Lead Channel Setting Pacing Pulse Width: 1 ms
Lead Channel Setting Sensing Sensitivity: 2.5 mV
Pulse Gen Serial Number: 951809

## 2022-07-21 LAB — PULMONARY FUNCTION TEST
DL/VA % pred: 108 %
DL/VA: 4.27 ml/min/mmHg/L
DLCO cor % pred: 48 %
DLCO cor: 11.2 ml/min/mmHg
DLCO unc % pred: 48 %
DLCO unc: 11.26 ml/min/mmHg
FEF 25-75 Post: 1.08 L/sec
FEF 25-75 Pre: 0.69 L/sec
FEF2575-%Change-Post: 58 %
FEF2575-%Pred-Post: 58 %
FEF2575-%Pred-Pre: 37 %
FEV1-%Change-Post: 10 %
FEV1-%Pred-Post: 42 %
FEV1-%Pred-Pre: 38 %
FEV1-Post: 1.13 L
FEV1-Pre: 1.02 L
FEV1FVC-%Change-Post: 6 %
FEV1FVC-%Pred-Pre: 100 %
FEV6-%Change-Post: 4 %
FEV6-%Pred-Post: 42 %
FEV6-%Pred-Pre: 40 %
FEV6-Post: 1.46 L
FEV6-Pre: 1.41 L
FEV6FVC-%Pred-Post: 107 %
FEV6FVC-%Pred-Pre: 107 %
FVC-%Change-Post: 4 %
FVC-%Pred-Post: 39 %
FVC-%Pred-Pre: 37 %
FVC-Post: 1.46 L
FVC-Pre: 1.41 L
Post FEV1/FVC ratio: 77 %
Post FEV6/FVC ratio: 100 %
Pre FEV1/FVC ratio: 72 %
Pre FEV6/FVC Ratio: 100 %
RV % pred: 73 %
RV: 1.85 L
TLC % pred: 58 %
TLC: 3.91 L

## 2022-07-21 LAB — IMMUNOFIXATION ELECTROPHORESIS
IgG (Immunoglobin G), Serum: 1010 mg/dL (ref 600–1540)
IgM, Serum: 56 mg/dL (ref 50–300)
Immunofix Electr Int: NOT DETECTED
Immunoglobulin A: 238 mg/dL (ref 70–320)

## 2022-07-21 LAB — PROTEIN ELECTROPHORESIS, SERUM
Albumin ELP: 4.1 g/dL (ref 3.8–4.8)
Alpha 1: 0.3 g/dL (ref 0.2–0.3)
Alpha 2: 0.8 g/dL (ref 0.5–0.9)
Beta 2: 0.4 g/dL (ref 0.2–0.5)
Beta Globulin: 0.4 g/dL (ref 0.4–0.6)
Gamma Globulin: 0.9 g/dL (ref 0.8–1.7)
Total Protein: 6.9 g/dL (ref 6.1–8.1)

## 2022-07-21 NOTE — Progress Notes (Signed)
Full PFT performed today. °

## 2022-07-21 NOTE — Progress Notes (Signed)
[name]    [id number]    Mar 05, 1941  Primary Care Physician:John, Hunt Oris, MD  Referring Physician: Biagio Borg, MD [address] [place],  [place] [address]  Chief complaint:   Patient being seen for excessive fatigue  HPI:  Had a PFT performed today showing slight progression of obstructive lung disease  Still does have daytime fatigue, daytime sleepiness, Has not been able to have his sleep study performed yet because he was feeling poorly when the sleep study came due and he rescheduled  He does have a history of having a pacemaker, elevated left hemidiaphragm Lower extremity edema Shortness of breath on exertion  Stated he finds it difficult to walk 100 yards-exercise tolerance has not changed recently  He does have some back pain with exertion  Usually goes to bed between 9 and 11 PM, falls asleep watching TV Wakes up after about 2 hours Out of bed between 6 and 7 AM sometimes he will stay in bed till 8 AM  He naps on a daily basis  Not as active as he used to be secondary to just feeling tired almost always  Admits to snoring, is not aware that he has been told about witnessed apneas  He does have a history of obstructive lung disease with significant reversibility on his most recent PFT from 2021  Outpatient Encounter Medications as of 07/21/2022  Medication Sig   acetaminophen (TYLENOL) 650 MG CR tablet Take 650 mg by mouth in the morning and at bedtime. Takes two time daily   amLODipine (NORVASC) 10 MG tablet Take 1 tablet (10 mg total) by mouth at bedtime.   atorvastatin (LIPITOR) 80 MG tablet TAKE 1 TABLET BY MOUTH EVERY DAY (Patient taking differently: Take 80 mg by mouth daily.)   Budeson-Glycopyrrol-Formoterol (BREZTRI AEROSPHERE) 160-9-4.8 MCG/ACT AERO 1 puffs twice per day   Cholecalciferol (VITAMIN D3) 50 MCG (2000 UT) TABS Take 2,000 Units by mouth daily.   dorzolamide-timolol (COSOPT) 22.3-6.8 MG/ML ophthalmic solution Place 1 drop  into both eyes in the morning and at bedtime.   fexofenadine (ALLEGRA) 180 MG tablet TAKE 1 TABLET (180 MG TOTAL) BY MOUTH DAILY. (Patient taking differently: Take 180 mg by mouth daily.)   flecainide (TAMBOCOR) 100 MG tablet TAKE 1 TABLET BY MOUTH TWICE A DAY (Patient taking differently: Take 100 mg by mouth 2 (two) times daily.)   furosemide (LASIX) 20 MG tablet TAKE 1 TABLET IF WEIGHT GAIN IS > 3LBS OVERNIGHT OR >5LBS IN A WEEK NO MORE THAN 2 TABS WEEKLY (Patient taking differently: Take 20 mg by mouth 2 (two) times daily.)   latanoprost (XALATAN) 0.005 % ophthalmic solution Place 1 drop into both eyes at bedtime.    meclizine (ANTIVERT) 12.5 MG tablet TAKE 1 TABLET EVERY 8 HOURS AS NEEDED   metoprolol succinate (TOPROL XL) 25 MG 24 hr tablet Take 1 tablet (25 mg total) by mouth at bedtime.   omeprazole (PRILOSEC) 40 MG capsule TAKE 1 CAPSULE BY MOUTH EVERY DAY AS NEEDED (Patient taking differently: Take 40 mg by mouth daily.)   psyllium (METAMUCIL) 0.52 g capsule Take 2 capsules by mouth daily.   rivaroxaban (XARELTO) 20 MG TABS tablet TAKE 1 TABLET BY MOUTH DAILY WITH SUPPER. (Patient taking differently: Take 20 mg by mouth daily with supper.)   tamsulosin (FLOMAX) 0.4 MG CAPS capsule TAKE 1 CAPSULE BY MOUTH TWICE A DAY   telmisartan (MICARDIS) 20 MG tablet Take 1 tablet (20 mg  total) by mouth at bedtime.   traMADol (ULTRAM) 50 MG tablet TAKE 1 TABLET BY MOUTH EVERY 6 HOURS AS NEEDED. FOR PAIN (Patient taking differently: Take 50 mg by mouth every 6 (six) hours as needed for severe pain.)   [DISCONTINUED] oxybutynin (DITROPAN-XL) 10 MG 24 hr tablet TAKE 1 TABLET (10 MG TOTAL) BY MOUTH DAILY. (Patient not taking: Reported on 06/17/2022)   No facility-administered encounter medications on file as of 07/21/2022.    Allergies as of 07/21/2022   (No Known Allergies)    Past Medical History:  Diagnosis Date   Abdominal pain, epigastric    Allergic rhinitis, cause unspecified    Arthritis    BPH  (benign prostatic hyperplasia)    CHF (congestive heart failure) (HCC)    Chronic anticoagulation    xarelto   Coronary artery calcification seen on CAT scan 12/26/2012   Depressive disorder, not elsewhere classified    Diverticulosis of colon (without mention of hemorrhage)    Esophageal reflux    High risk medication use    Hyperlipidemia    Hyperplastic rectal polyp    Hypertension    Lumbago    Pacemaker    Paralyzed hemidiaphragm    LEFT   Persistent atrial fibrillation (HCC)    Sick sinus syndrome ([place]) 05/16/2009   s/p PPM ([employer]) by [name]    Past Surgical History:  Procedure Laterality Date   CYSTOSCOPY WITH RETROGRADE PYELOGRAM, URETEROSCOPY AND STENT PLACEMENT Left 12/30/2021   Procedure: CYSTOSCOPY WITH RETROGRADE PYELOGRAM, URETEROSCOPY;  Surgeon: Remi Haggard, MD;  Location: [facility];  Service: Urology;  Laterality: Left;   ESOPHAGOGASTRODUODENOSCOPY N/A 12/05/2013   Procedure: ESOPHAGOGASTRODUODENOSCOPY (EGD);  Surgeon: [name], MD;  Location: [facility];  Service: Endoscopy;  Laterality: N/A;   ESOPHAGOGASTRODUODENOSCOPY N/A 07/04/2015   Procedure: ESOPHAGOGASTRODUODENOSCOPY (EGD);  Surgeon: [name], MD;  Location: [facility];  Service: Endoscopy;  Laterality: N/A;   EXTRACORPOREAL SHOCK WAVE LITHOTRIPSY Left 10/13/2021   Procedure: EXTRACORPOREAL SHOCK WAVE LITHOTRIPSY (ESWL);  Surgeon: Lucas Mallow, MD;  Location: [facility];  Service: Urology;  Laterality: Left;  90 MINS   INSERT / REPLACE / REMOVE PACEMAKER  05/2009    [employer] VFIEPP 29  (serial number [id number]) dual-chamber pacemaker   LEAD REVISION/REPAIR N/A 10/15/2020   Procedure: LEAD REVISION/REPAIR;  Surgeon: Thompson Grayer, MD;  Location: [facility];  Service: Cardiovascular;  Laterality: N/A;   PACEMAKER PLACEMENT Bilateral    '05   PPM GENERATOR CHANGEOUT N/A 10/15/2020   Procedure: PPM GENERATOR CHANGEOUT;  Surgeon:  Thompson Grayer, MD;  Location: [facility];  Service: Cardiovascular;  Laterality: N/A;    Family History  Problem Relation Age of Onset   Diabetes Mother    Hypertension Mother    Hypertension Father    Lung cancer Father    Cancer Father    Asthma Sister    Hypertension Sister    Arthritis Paternal Grandmother    Hypertension Brother    Hypertension Daughter    Hypertension Son     Social History   Socioeconomic History   Marital status: Married    Spouse name: Not on file   Number of children: 4   Years of education: Not on file   Highest education level: Not on file  Occupational History   Occupation: retired    Fish farm manager: RETIRED  Tobacco Use   Smoking status: Former    Packs/day: 0.50  Years: 20.00    Total pack years: 10.00    Types: Cigarettes    Quit date: 11/17/1991    Years since quitting: 30.6   Smokeless tobacco: Former    Types: Nurse, children's Use: Never used  Substance and Sexual Activity   Alcohol use: No   Drug use: No   Sexual activity: Never  Other Topics Concern   Not on file  Social History Narrative   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    Caffeine 1-2 day cups   Right handed   Tri-level home      Social Determinants of Health   Financial Resource Strain: Low Risk  (05/20/2021)   Overall Financial Resource Strain (CARDIA)    Difficulty of Paying Living Expenses: Not hard at all  Food Insecurity: No Food Insecurity (05/20/2021)   Hunger Vital Sign    Worried About Running Out of Food in the Last Year: Never true    Ran Out of Food in the Last Year: Never true  Transportation Needs: No Transportation Needs (05/20/2021)   PRAPARE - Hydrologist (Medical): No    Lack of Transportation (Non-Medical): No  Physical Activity: Sufficiently Active (05/20/2021)   Exercise Vital Sign    Days of Exercise per Week: 5 days    Minutes of Exercise per Session: 30 min  Stress: No Stress Concern Present (05/20/2021)    [place]    Feeling of Stress : Not at all  Social Connections: Socially Isolated (05/20/2021)   Social Connection and Isolation Panel [NHANES]    Frequency of Communication with Friends and Family: More than three times a week    Frequency of Social Gatherings with Friends and Family: Once a week    Attends Religious Services: Never    Marine scientist or Organizations: No    Attends Archivist Meetings: Never    Marital Status: Never married  Intimate Partner Violence: Not At Risk (05/20/2021)   Humiliation, Afraid, Rape, and Kick questionnaire    Fear of Current or Ex-Partner: No    Emotionally Abused: No    Physically Abused: No    Sexually Abused: No    Review of Systems  Constitutional:  Positive for fatigue.  Respiratory:  Positive for shortness of breath.   Psychiatric/Behavioral:  Positive for sleep disturbance.     Vitals:   07/21/22 1346  BP: 104/70  Pulse: 60  Temp: 97.7 F (36.5 C)  SpO2: 96%     Physical Exam Constitutional:      Appearance: He is obese.  HENT:     Head: Normocephalic.     Mouth/Throat:     Mouth: Mucous membranes are moist.  Cardiovascular:     Rate and Rhythm: Normal rate and regular rhythm.     Heart sounds: No murmur heard.    No friction rub.  Pulmonary:     Effort: No respiratory distress.     Breath sounds: No stridor. No wheezing or rhonchi.  Musculoskeletal:     Cervical back: No rigidity or tenderness.  Neurological:     Mental Status: He is alert.  Psychiatric:        Mood and Affect: Mood normal.      05/06/2022    4:00 PM  Results of the Epworth flowsheet  Sitting and reading 3  Watching TV 3  Sitting, inactive in a public place (e.g. a theatre or  a meeting) 0  As a passenger in a car for an hour without a break 0  Lying down to rest in the afternoon when circumstances permit 1  Sitting and talking to someone 0  Sitting quietly  after a lunch without alcohol 3  In a car, while stopped for a few minutes in traffic 0  Total score 10    Data Reviewed: Chest x-ray from 2021 and previously reviewed with patient showing elevated left hemidiaphragm  Most recent PFT from 2021 did reveal severe obstructive disease with significant bronchodilator response, significant decrease in FEF 25-75 also did show significant bronchodilator response  Most recent PFT was reviewed with the patient showing worsening FEV1, FVC-no significant bronchodilator response  Assessment:  Severe obstructive lung disease  Shortness of breath on exertion -Likely multifactorial  Fatigue  Excessive daytime sleepiness -Possibility of obstructive sleep apnea discussed  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

## 2022-07-21 NOTE — Patient Instructions (Signed)
Full PFT performed today. °

## 2022-07-21 NOTE — Patient Instructions (Signed)
I will see you back in about 6 months  Continue using your inhalers on a regular basis  Graded exercises as tolerated  Call us with any significant concerns  Make sure you go ahead and follow-up with the sleep study

## 2022-07-22 ENCOUNTER — Encounter: Payer: Self-pay | Admitting: Neurology

## 2022-07-28 NOTE — Progress Notes (Unsigned)
Cardiology Office Note:    Date:  07/29/2022   ID:  [name], DOB 09/22/1941, MRN [id number]  PCP:  Biagio Borg, MD   [facility] Providers Cardiologist:  Lenna Sciara, MD Referring MD: Biagio Borg, MD   Chief Complaint/Reason for Referral: Establish cardiovascular care  ASSESSMENT:    1. Chronic diastolic CHF (congestive heart failure) (HCC)   2. Paroxysmal atrial fibrillation ([place])   3. Sick sinus syndrome ([place])   4. Hypertension, unspecified type   5. Aortic atherosclerosis ([place])   6. Coronary artery calcification seen on CAT scan   7. Hyperlipidemia LDL goal <70   8. Stage 3 chronic kidney disease, unspecified whether stage 3a or 3b CKD ([place])   9. Chronic obstructive pulmonary disease, unspecified COPD type ([place])      PLAN:    In order of problems listed above: 1.  Chronic diastolic heart failure: We will obtain echocardiogram to evaluate further.  Continue Toprol, iand will start Jardiance 10 mg daily.  It is unclear whether his shortness of breath is due to volume overload or his pulmonary issues including elevated hemidiaphragm after his device implantation.  I will have him increase his 20 twice daily Lasix to 40 mg twice daily for 3 days.  I would like him to call us and let us know if this is helped.  If it has we will check a BMP and remain at 40 mg twice daily.  If it does not then I will refer him for right heart catheterization to evaluate further.  Follow-up in 3 months or earlier if needed. 2.  Paroxysmal atrial fibrillation: Continue Toprol and Xarelto. 3.  Sick sinus syndrome: Follows with EP 4.  Hypertension: BP is low today. We will change Toprol, Norvasc, Micardis to p.m. dosing given fluctuating blood pressures during the day 5.  Aortic atherosclerosis: Continue atorvastatin and Xarelto and lieu of aspirin.  Goal LDL is less than 70.  We will add Zetia as detailed below as he is on maximum dose atorvastatin.  We will check lipid panel, LFTs, and  LP(a) in 2 months.  6.  Coronary artery calcification: CT FFR was negative in 2022.  Continue aspirin and statin. 7.  Hyperlipidemia: LDL recently was above goal 70.  Start Zetia 10 mg daily. 8.  Chronic kidney disease: GFR currently is 60 with a creatinine of 1.12 monitor for now.  Continue telmisartan 9.  COPD: FEV1 of close to 1.  He is followed by pulmonology.            Dispo:  Return in about 3 months (around 10/28/2022).      Medication Adjustments/Labs and Tests Ordered: Current medicines are reviewed at length with the patient today.  Concerns regarding medicines are outlined above.  The following changes have been made:     Labs/tests ordered: Orders Placed This Encounter  Procedures   Lipid panel   Lipoprotein A (LPA)   Hepatic function panel   ECHOCARDIOGRAM COMPLETE    Medication Changes: Meds ordered this encounter  Medications   empagliflozin (JARDIANCE) 10 MG TABS tablet    Sig: Take 1 tablet (10 mg total) by mouth daily before breakfast.    Dispense:  90 tablet    Refill:  3   furosemide (LASIX) 40 MG tablet    Sig: Take 1 tablet (40 mg total) by mouth 2 (two) times daily.    Dispense:  90 tablet    Refill:  1   ezetimibe (ZETIA) 10  MG tablet    Sig: Take 1 tablet (10 mg total) by mouth daily.    Dispense:  90 tablet    Refill:  3     Current medicines are reviewed at length with the patient today.  The patient does not have concerns regarding medicines.   History of Present Illness:    FOCUSED PROBLEM LIST:   1.  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  Paroxysmal atrial fibrillation on Xarelto 3.  Coronary artery calcification with negative CT FFR 2022 4.  Aortic atherosclerosis on CT FFR 2022 5.  Hypertension 6.  Hyperlipidemia 7.  Severe obstructive disease on PFTs 2021 with FEV1 around 1 L 8.  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  Chronic kidney disease stage III  May 2023:  Seen for initial consultation to establish general cardiovascular care.  Due to chronic diastolic heart failure his Lasix was increased to 20 mg twice daily and Jardiance 10 mg was started.  Additionally due to elevated diastolic pressure his Norvasc was increased to 10 mg.  An echocardiogram was obtained which demonstrated preserved ejection fraction with mild asymmetric left ventricular hypertrophy and no significant valvular abnormalities.  Today: The patient was admitted in early July with failure to thrive.  He was found to have a left lower lobe pneumonia and was treated.  Patient was seen by pulmonary in the interim.  PFTs were done which put to be relatively unchanged but not yet been read.  His FEV1 is still around 1.  A sleep study was ordered due to excessive fatigue and daytime somnolence.  He continues to be plagued with shortness of breath.  This does not occur at rest.  He also finds himself developing orthopnea and paroxysmal nocturnal dyspnea maybe once a week.  He has some chronic ankle swelling which is likely due to his amlodipine.  He does not note any significant peripheral edema.  When he does get short of breath he does not notice any wheezing.  He denies any exertional angina, severe bleeding or bruising, signs or symptoms of stroke, or need for emergency room visits or hospitalizations since discharge.   Current Medications: Current Meds  Medication Sig   acetaminophen (TYLENOL) 650 MG CR tablet Take 650 mg by mouth in the morning and at bedtime. Takes two time daily   amLODipine (NORVASC) 10 MG tablet Take 1 tablet (10 mg total) by mouth at bedtime.   atorvastatin (LIPITOR) 80 MG tablet TAKE 1 TABLET BY MOUTH EVERY DAY (Patient taking differently: Take 80 mg by mouth daily.)   Budeson-Glycopyrrol-Formoterol (BREZTRI AEROSPHERE) 160-9-4.8 MCG/ACT AERO 1 puffs twice per day   Cholecalciferol (VITAMIN D3) 50 MCG (2000 UT) TABS Take 2,000 Units by mouth daily.    dorzolamide-timolol (COSOPT) 22.3-6.8 MG/ML ophthalmic solution Place 1 drop into both eyes in the morning and at bedtime.   empagliflozin (JARDIANCE) 10 MG TABS tablet Take 1 tablet (10 mg total) by mouth daily before breakfast.   ezetimibe (ZETIA) 10 MG tablet Take 1 tablet (10 mg total) by mouth daily.   fexofenadine (ALLEGRA) 180 MG tablet TAKE 1 TABLET (180 MG TOTAL) BY MOUTH DAILY. (Patient taking differently: Take 180 mg by mouth daily.)   flecainide (TAMBOCOR) 100 MG tablet TAKE 1 TABLET BY MOUTH TWICE A DAY (Patient taking differently: Take 100 mg by mouth 2 (two) times daily.)   furosemide (LASIX) 40 MG tablet Take 1 tablet (40 mg total) by mouth 2 (two) times  daily.   latanoprost (XALATAN) 0.005 % ophthalmic solution Place 1 drop into both eyes at bedtime.    meclizine (ANTIVERT) 12.5 MG tablet TAKE 1 TABLET EVERY 8 HOURS AS NEEDED   metoprolol succinate (TOPROL XL) 25 MG 24 hr tablet Take 1 tablet (25 mg total) by mouth at bedtime.   omeprazole (PRILOSEC) 40 MG capsule TAKE 1 CAPSULE BY MOUTH EVERY DAY AS NEEDED (Patient taking differently: Take 40 mg by mouth daily.)   psyllium (METAMUCIL) 0.52 g capsule Take 2 capsules by mouth daily.   rivaroxaban (XARELTO) 20 MG TABS tablet TAKE 1 TABLET BY MOUTH DAILY WITH SUPPER. (Patient taking differently: Take 20 mg by mouth daily with supper.)   tamsulosin (FLOMAX) 0.4 MG CAPS capsule TAKE 1 CAPSULE BY MOUTH TWICE A DAY   telmisartan (MICARDIS) 20 MG tablet Take 1 tablet (20 mg total) by mouth at bedtime.   traMADol (ULTRAM) 50 MG tablet TAKE 1 TABLET BY MOUTH EVERY 6 HOURS AS NEEDED. FOR PAIN (Patient taking differently: Take 50 mg by mouth every 6 (six) hours as needed for severe pain.)   [DISCONTINUED] furosemide (LASIX) 20 MG tablet TAKE 1 TABLET IF WEIGHT GAIN IS > 3LBS OVERNIGHT OR >5LBS IN A WEEK NO MORE THAN 2 TABS WEEKLY (Patient taking differently: Take 20 mg by mouth 2 (two) times daily.)     Allergies:    Patient has no known  allergies.   Social History:   Social History   Tobacco Use   Smoking status: Former    Packs/day: 0.50    Years: 20.00    Total pack years: 10.00    Types: Cigarettes    Quit date: 11/17/1991    Years since quitting: 30.7   Smokeless tobacco: Former    Types: Nurse, children's Use: Never used  Substance Use Topics   Alcohol use: No   Drug use: No     Family Hx: Family History  Problem Relation Age of Onset   Diabetes Mother    Hypertension Mother    Hypertension Father    Lung cancer Father    Cancer Father    Asthma Sister    Hypertension Sister    Arthritis Paternal Grandmother    Hypertension Brother    Hypertension Daughter    Hypertension Son      Review of Systems:   Please see the history of present illness.    All other systems reviewed and are negative.     EKGs/Labs/Other Test Reviewed:    EKG:  EKG performed November 2022 that I personally reviewed demonstrates atrial pacing with right bundle branch block Prior CV studies:  CTA FFR 2022 with coronary artery calcification but normal FFR significant lesions  ABIs 2023 within normal range  TTE 2021 with ejection fraction of 50 to 55% with inferior basal hypokinesis.  No significant valvular abnormalities  Other studies Reviewed: Review of the additional studies/records demonstrates: Aortic atherosclerosis seen on CT of abdomen and pelvis 2022  Recent Labs: 05/18/2022: B Natriuretic Peptide 11.9 06/01/2022: ALT 64; BUN 26; Creatinine, Ser 1.46; Hemoglobin 14.8; Platelets 191.0; Potassium 3.6; Pro B Natriuretic peptide (BNP) 19.0; Sodium 133; TSH 1.29   Recent Lipid Panel Lab Results  Component Value Date/Time   CHOL 163 06/01/2022 10:04 AM   TRIG 72.0 06/01/2022 10:04 AM   TRIG 31 10/25/2006 09:35 AM   HDL 59.90 06/01/2022 10:04 AM   LDLCALC 89 06/01/2022 10:04 AM   LDLDIRECT 183.4 12/26/2013 09:11 AM  Risk Assessment/Calculations:     CHA2DS2-VASc Score = 5   This indicates a  7.2% annual risk of stroke. The patient's score is based upon: CHF History: 1 HTN History: 1 Diabetes History: 0 Stroke History: 0 Vascular Disease History: 1 Age Score: 2 Gender Score: 0         Physical Exam:    VS:  BP 98/70   Pulse 67   Ht '5\' 8"'$  (1.727 m)   Wt 207 lb (93.9 kg)   SpO2 97%   BMI 31.47 kg/m    Wt Readings from Last 3 Encounters:  07/29/22 207 lb (93.9 kg)  07/21/22 206 lb (93.4 kg)  07/15/22 207 lb (93.9 kg)    GENERAL:  No apparent distress, AOx3 HEENT:  No carotid bruits, +2 carotid impulses, no scleral icterus CAR: RRR no murmurs, gallops, rubs, or thrills RES:  Clear to auscultation bilaterally ABD:  Soft, nontender, nondistended, positive bowel sounds x 4 VASC:  +2 radial pulses, +2 carotid pulses, palpable pedal pulses NEURO:  CN 2-12 grossly intact; motor and sensory grossly intact PSYCH:  No active depression or anxiety EXT:  No edema, ecchymosis, or cyanosis  Signed, Early Osmond, MD  07/29/2022 10:31 AM    [address], [place], [place]  [address] Phone: [phone]; Fax: [phone]   Note:  This document was prepared using Dragon voice recognition software and may include unintentional dictation errors.

## 2022-07-29 ENCOUNTER — Ambulatory Visit: Payer: Medicare Other | Attending: Internal Medicine | Admitting: Internal Medicine

## 2022-07-29 ENCOUNTER — Encounter: Payer: Self-pay | Admitting: Internal Medicine

## 2022-07-29 VITALS — BP 98/70 | HR 67 | Ht 68.0 in | Wt 207.0 lb

## 2022-07-29 DIAGNOSIS — I495 Sick sinus syndrome: Secondary | ICD-10-CM | POA: Diagnosis not present

## 2022-07-29 DIAGNOSIS — I10 Essential (primary) hypertension: Secondary | ICD-10-CM | POA: Insufficient documentation

## 2022-07-29 DIAGNOSIS — I2510 Atherosclerotic heart disease of native coronary artery without angina pectoris: Secondary | ICD-10-CM | POA: Insufficient documentation

## 2022-07-29 DIAGNOSIS — I700 Atherosclerosis of aorta: Secondary | ICD-10-CM | POA: Diagnosis not present

## 2022-07-29 DIAGNOSIS — E785 Hyperlipidemia, unspecified: Secondary | ICD-10-CM | POA: Diagnosis not present

## 2022-07-29 DIAGNOSIS — N183 Chronic kidney disease, stage 3 unspecified: Secondary | ICD-10-CM | POA: Insufficient documentation

## 2022-07-29 DIAGNOSIS — I480 Paroxysmal atrial fibrillation: Secondary | ICD-10-CM | POA: Insufficient documentation

## 2022-07-29 DIAGNOSIS — I5032 Chronic diastolic (congestive) heart failure: Secondary | ICD-10-CM | POA: Insufficient documentation

## 2022-07-29 DIAGNOSIS — J449 Chronic obstructive pulmonary disease, unspecified: Secondary | ICD-10-CM | POA: Insufficient documentation

## 2022-07-29 MED ORDER — EMPAGLIFLOZIN 10 MG PO TABS: 10.0000 mg | ORAL_TABLET | Freq: Every day | ORAL | 3 refills | Status: DC

## 2022-07-29 MED ORDER — FUROSEMIDE 40 MG PO TABS: 40.0000 mg | ORAL_TABLET | Freq: Two times a day (BID) | ORAL | 1 refills | Status: DC

## 2022-07-29 MED ORDER — EZETIMIBE 10 MG PO TABS: 10.0000 mg | ORAL_TABLET | Freq: Every day | ORAL | 3 refills | Status: DC

## 2022-07-29 NOTE — Patient Instructions (Signed)
Medication Instructions:  Your physician has recommended you make the following change in your medication:  1) START taking Jardiance 10 mg daily  2) INCREASE Lasix to 40 mg twice daily for three days and then call us to let us know if you are feeling better  3) START taking zetia 10 mg daily   *If you need a refill on your cardiac medications before your next appointment, please call your pharmacy*  Labs: IN TWO MONTHS: Fasting lipids, Liver panel, and LipoA  Testing/Procedures: Your physician has requested that you have an echocardiogram. Echocardiography is a painless test that uses sound waves to create images of your heart. It provides your doctor with information about the size and shape of your heart and how well your heart's chambers and valves are working. This procedure takes approximately one hour. There are no restrictions for this procedure.  Follow-Up: At [facility], you and your health needs are our priority.  As part of our continuing mission to provide you with exceptional heart care, we have created designated Provider Care Teams.  These Care Teams include your primary Cardiologist (physician) and Advanced Practice Providers (APPs -  Physician Assistants and Nurse Practitioners) who all work together to provide you with the care you need, when you need it.   Your next appointment:   3 month(s)  The format for your next appointment:   In Person  Provider:   Early Osmond, MD     Important Information About Sugar

## 2022-07-31 ENCOUNTER — Encounter: Payer: Self-pay | Admitting: Internal Medicine

## 2022-08-03 ENCOUNTER — Ambulatory Visit (INDEPENDENT_AMBULATORY_CARE_PROVIDER_SITE_OTHER): Payer: Medicare Other

## 2022-08-03 ENCOUNTER — Encounter: Payer: Self-pay | Admitting: Internal Medicine

## 2022-08-03 VITALS — Ht 68.0 in | Wt 207.0 lb

## 2022-08-03 DIAGNOSIS — Z0000 Encounter for general adult medical examination without abnormal findings: Secondary | ICD-10-CM | POA: Diagnosis not present

## 2022-08-03 NOTE — Patient Instructions (Addendum)
[name] , Thank you for taking time to come for your Medicare Wellness Visit. I appreciate your ongoing commitment to your health goals. Please review the following plan we discussed and let me know if I can assist you in the future.   Screening recommendations/referrals: Colonoscopy: Last done 12/29/2017; no longer recommended due to age Recommended yearly ophthalmology/optometry visit for glaucoma screening and checkup Recommended yearly dental visit for hygiene and checkup  Vaccinations: Influenza vaccine: 08/08/2021; due Fall Season 2023 Pneumococcal vaccine: 09/16/2010, 09/26/2013, 03/09/2021 Tdap vaccine: 11/07/2019; due every 10 years Shingles vaccine: 12/19/2018, 05/27/2019   Covid-19: 12/18/2019, 01/08/2020, 08/19/2020, 03/09/2021, 08/18/2021  Advanced directives: Yes;Patient has a Living Will  Conditions/risks identified: Yes; advised to do chair exercises.   Next appointment: Follow up in one year for your annual wellness visit on 08/05/2023 at 3:30 p.m. via telephone with Nurse Mignon Pine H.  Preventive Care 79 Years and Older, Male  Preventive care refers to lifestyle choices and visits with your health care provider that can promote health and wellness. What does preventive care include? A yearly physical exam. This is also called an annual well check. Dental exams once or twice a year. Routine eye exams. Ask your health care provider how often you should have your eyes checked. Personal lifestyle choices, including: Daily care of your teeth and gums. Regular physical activity. Eating a healthy diet. Avoiding tobacco and drug use. Limiting alcohol use. Practicing safe sex. Taking low doses of aspirin every day. Taking vitamin and mineral supplements as recommended by your health care provider. What happens during an annual well check? The services and screenings done by your health care provider during your annual well check will depend on your age, overall health, lifestyle risk  factors, and family history of disease. Counseling  Your health care provider may ask you questions about your: Alcohol use. Tobacco use. Drug use. Emotional well-being. Home and relationship well-being. Sexual activity. Eating habits. History of falls. Memory and ability to understand (cognition). Work and work Statistician. Screening  You may have the following tests or measurements: Height, weight, and BMI. Blood pressure. Lipid and cholesterol levels. These may be checked every 5 years, or more frequently if you are over 86 years old. Skin check. Lung cancer screening. You may have this screening every year starting at age 79 if you have a 30-pack-year history of smoking and currently smoke or have quit within the past 15 years. Fecal occult blood test (FOBT) of the stool. You may have this test every year starting at age 40. Flexible sigmoidoscopy or colonoscopy. You may have a sigmoidoscopy every 5 years or a colonoscopy every 10 years starting at age 73. Prostate cancer screening. Recommendations will vary depending on your family history and other risks. Hepatitis C blood test. Hepatitis B blood test. Sexually transmitted disease (STD) testing. Diabetes screening. This is done by checking your blood sugar (glucose) after you have not eaten for a while (fasting). You may have this done every 1-3 years. Abdominal aortic aneurysm (AAA) screening. You may need this if you are a current or former smoker. Osteoporosis. You may be screened starting at age 68 if you are at high risk. Talk with your health care provider about your test results, treatment options, and if necessary, the need for more tests. Vaccines  Your health care provider may recommend certain vaccines, such as: Influenza vaccine. This is recommended every year. Tetanus, diphtheria, and acellular pertussis (Tdap, Td) vaccine. You may need a Td booster every 10 years.  Zoster vaccine. You may need this after age  79. Pneumococcal 13-valent conjugate (PCV13) vaccine. One dose is recommended after age 71. Pneumococcal polysaccharide (PPSV23) vaccine. One dose is recommended after age 79. Talk to your health care provider about which screenings and vaccines you need and how often you need them. This information is not intended to replace advice given to you by your health care provider. Make sure you discuss any questions you have with your health care provider. Document Released: 11/29/2015 Document Revised: 07/22/2016 Document Reviewed: 09/03/2015 Elsevier Interactive Patient Education  2017 [place] Prevention in the Home Falls can cause injuries. They can happen to people of all ages. There are many things you can do to make your home safe and to help prevent falls. What can I do on the outside of my home? Regularly fix the edges of walkways and driveways and fix any cracks. Remove anything that might make you trip as you walk through a door, such as a raised step or threshold. Trim any bushes or trees on the path to your home. Use bright outdoor lighting. Clear any walking paths of anything that might make someone trip, such as rocks or tools. Regularly check to see if handrails are loose or broken. Make sure that both sides of any steps have handrails. Any raised decks and porches should have guardrails on the edges. Have any leaves, snow, or ice cleared regularly. Use sand or salt on walking paths during winter. Clean up any spills in your garage right away. This includes oil or grease spills. What can I do in the bathroom? Use night lights. Install grab bars by the toilet and in the tub and shower. Do not use towel bars as grab bars. Use non-skid mats or decals in the tub or shower. If you need to sit down in the shower, use a plastic, non-slip stool. Keep the floor dry. Clean up any water that spills on the floor as soon as it happens. Remove soap buildup in the tub or shower  regularly. Attach bath mats securely with double-sided non-slip rug tape. Do not have throw rugs and other things on the floor that can make you trip. What can I do in the bedroom? Use night lights. Make sure that you have a light by your bed that is easy to reach. Do not use any sheets or blankets that are too big for your bed. They should not hang down onto the floor. Have a firm chair that has side arms. You can use this for support while you get dressed. Do not have throw rugs and other things on the floor that can make you trip. What can I do in the kitchen? Clean up any spills right away. Avoid walking on wet floors. Keep items that you use a lot in easy-to-reach places. If you need to reach something above you, use a strong step stool that has a grab bar. Keep electrical cords out of the way. Do not use floor polish or wax that makes floors slippery. If you must use wax, use non-skid floor wax. Do not have throw rugs and other things on the floor that can make you trip. What can I do with my stairs? Do not leave any items on the stairs. Make sure that there are handrails on both sides of the stairs and use them. Fix handrails that are broken or loose. Make sure that handrails are as long as the stairways. Check any carpeting to make sure that  it is firmly attached to the stairs. Fix any carpet that is loose or worn. Avoid having throw rugs at the top or bottom of the stairs. If you do have throw rugs, attach them to the floor with carpet tape. Make sure that you have a light switch at the top of the stairs and the bottom of the stairs. If you do not have them, ask someone to add them for you. What else can I do to help prevent falls? Wear shoes that: Do not have high heels. Have rubber bottoms. Are comfortable and fit you well. Are closed at the toe. Do not wear sandals. If you use a stepladder: Make sure that it is fully opened. Do not climb a closed stepladder. Make sure that  both sides of the stepladder are locked into place. Ask someone to hold it for you, if possible. Clearly mark and make sure that you can see: Any grab bars or handrails. First and last steps. Where the edge of each step is. Use tools that help you move around (mobility aids) if they are needed. These include: Canes. Walkers. Scooters. Crutches. Turn on the lights when you go into a dark area. Replace any light bulbs as soon as they burn out. Set up your furniture so you have a clear path. Avoid moving your furniture around. If any of your floors are uneven, fix them. If there are any pets around you, be aware of where they are. Review your medicines with your doctor. Some medicines can make you feel dizzy. This can increase your chance of falling. Ask your doctor what other things that you can do to help prevent falls. This information is not intended to replace advice given to you by your health care provider. Make sure you discuss any questions you have with your health care provider. Document Released: 08/29/2009 Document Revised: 04/09/2016 Document Reviewed: 12/07/2014 Elsevier Interactive Patient Education  2017 [employer].

## 2022-08-03 NOTE — Progress Notes (Signed)
Virtual Visit via Telephone Note  I connected with  [name] on 08/03/22 at  3:45 PM EDT by telephone and verified that I am speaking with the correct person using two identifiers.  Location: Patient: Home Provider: [place] Persons participating in the virtual visit: [place]   I discussed the limitations, risks, security and privacy concerns of performing an evaluation and management service by telephone and the availability of in person appointments. The patient expressed understanding and agreed to proceed.  Interactive audio and video telecommunications were attempted between this nurse and patient, however failed, due to patient having technical difficulties OR patient did not have access to video capability.  We continued and completed visit with audio only.  Some vital signs may be absent or patient reported.   Sheral Flow, LPN  Subjective:   [name] is a 81 y.o. male who presents for Medicare Annual/Subsequent preventive examination.  Review of Systems     Cardiac Risk Factors include: advanced age (>68mn, >>43women);family history of premature cardiovascular disease;hypertension;diabetes mellitus;dyslipidemia;male gender;sedentary lifestyle;obesity (BMI >30kg/m2)     Objective:    Today's Vitals   08/03/22 1607  Weight: 207 lb (93.9 kg)  Height: '5\' 8"'$  (1.727 m)   Body mass index is 31.47 kg/m.     08/03/2022    4:02 PM 07/15/2022    9:10 AM 05/19/2022    4:00 PM 05/18/2022    4:08 PM 12/30/2021    8:30 AM 11/04/2021    1:54 PM 10/13/2021    9:32 AM  Advanced Directives  Does Patient Have a Medical Advance Directive? Yes Yes Yes No No No Yes  Type of Advance Directive Living will Living will Living will    [place] Does patient want to make changes to medical advance directive? No - Patient declined  No - Patient declined      [place] of [place]?       No - copy requested   Would patient like information on creating a medical advance directive?     No - Patient declined      Current Medications (verified) Outpatient Encounter Medications as of 08/03/2022  Medication Sig   acetaminophen (TYLENOL) 650 MG CR tablet Take 650 mg by mouth in the morning and at bedtime. Takes two time daily   amLODipine (NORVASC) 10 MG tablet Take 1 tablet (10 mg total) by mouth at bedtime.   atorvastatin (LIPITOR) 80 MG tablet TAKE 1 TABLET BY MOUTH EVERY DAY (Patient taking differently: Take 80 mg by mouth daily.)   Budeson-Glycopyrrol-Formoterol (BREZTRI AEROSPHERE) 160-9-4.8 MCG/ACT AERO 1 puffs twice per day   Cholecalciferol (VITAMIN D3) 50 MCG (2000 UT) TABS Take 2,000 Units by mouth daily.   dorzolamide-timolol (COSOPT) 22.3-6.8 MG/ML ophthalmic solution Place 1 drop into both eyes in the morning and at bedtime.   empagliflozin (JARDIANCE) 10 MG TABS tablet Take 1 tablet (10 mg total) by mouth daily before breakfast.   ezetimibe (ZETIA) 10 MG tablet Take 1 tablet (10 mg total) by mouth daily.   fexofenadine (ALLEGRA) 180 MG tablet TAKE 1 TABLET (180 MG TOTAL) BY MOUTH DAILY. (Patient taking differently: Take 180 mg by mouth daily.)   flecainide (TAMBOCOR) 100 MG tablet TAKE 1 TABLET BY MOUTH TWICE A DAY (Patient taking differently: Take 100 mg by mouth 2 (two) times daily.)   furosemide (LASIX) 40 MG tablet Take 1 tablet (40 mg total) by mouth 2 (two) times  daily.   latanoprost (XALATAN) 0.005 % ophthalmic solution Place 1 drop into both eyes at bedtime.    meclizine (ANTIVERT) 12.5 MG tablet TAKE 1 TABLET EVERY 8 HOURS AS NEEDED   metoprolol succinate (TOPROL XL) 25 MG 24 hr tablet Take 1 tablet (25 mg total) by mouth at bedtime.   omeprazole (PRILOSEC) 40 MG capsule TAKE 1 CAPSULE BY MOUTH EVERY DAY AS NEEDED (Patient taking differently: Take 40 mg by mouth daily.)   psyllium (METAMUCIL) 0.52 g capsule Take 2 capsules by mouth daily.   rivaroxaban (XARELTO) 20 MG TABS tablet  TAKE 1 TABLET BY MOUTH DAILY WITH SUPPER. (Patient taking differently: Take 20 mg by mouth daily with supper.)   tamsulosin (FLOMAX) 0.4 MG CAPS capsule TAKE 1 CAPSULE BY MOUTH TWICE A DAY   telmisartan (MICARDIS) 20 MG tablet Take 1 tablet (20 mg total) by mouth at bedtime.   traMADol (ULTRAM) 50 MG tablet TAKE 1 TABLET BY MOUTH EVERY 6 HOURS AS NEEDED. FOR PAIN (Patient taking differently: Take 50 mg by mouth every 6 (six) hours as needed for severe pain.)   No facility-administered encounter medications on file as of 08/03/2022.    Allergies (verified) Patient has no known allergies.   History: Past Medical History:  Diagnosis Date   Abdominal pain, epigastric    Allergic rhinitis, cause unspecified    Arthritis    BPH (benign prostatic hyperplasia)    CHF (congestive heart failure) (HCC)    Chronic anticoagulation    xarelto   Coronary artery calcification seen on CAT scan 12/26/2012   Depressive disorder, not elsewhere classified    Diverticulosis of colon (without mention of hemorrhage)    Esophageal reflux    High risk medication use    Hyperlipidemia    Hyperplastic rectal polyp    Hypertension    Lumbago    Pacemaker    Paralyzed hemidiaphragm    LEFT   Persistent atrial fibrillation (HCC)    Sick sinus syndrome ([place]) 05/16/2009   s/p PPM ([employer]) by [name]   Past Surgical History:  Procedure Laterality Date   CYSTOSCOPY WITH RETROGRADE PYELOGRAM, URETEROSCOPY AND STENT PLACEMENT Left 12/30/2021   Procedure: CYSTOSCOPY WITH RETROGRADE PYELOGRAM, URETEROSCOPY;  Surgeon: Remi Haggard, MD;  [facility];  Service: Urology;  Laterality: Left;   ESOPHAGOGASTRODUODENOSCOPY N/A 12/05/2013   Procedure: ESOPHAGOGASTRODUODENOSCOPY (EGD);  Surgeon: [name], MD;  Location: [facility];  Service: Endoscopy;  Laterality: N/A;   ESOPHAGOGASTRODUODENOSCOPY N/A 07/04/2015   Procedure: ESOPHAGOGASTRODUODENOSCOPY (EGD);  Surgeon: [name], MD;  Location: [place];  Service: Endoscopy;  Laterality: N/A;   EXTRACORPOREAL SHOCK WAVE LITHOTRIPSY Left 10/13/2021   Procedure: EXTRACORPOREAL SHOCK WAVE LITHOTRIPSY (ESWL);  Surgeon: Lucas Mallow, MD;  Location: [facility];  Service: Urology;  Laterality: Left;  90 MINS   INSERT / REPLACE / REMOVE PACEMAKER  05/2009    [employer] [id number]  (serial number [id number]) dual-chamber pacemaker   LEAD REVISION/REPAIR N/A 10/15/2020   Procedure: LEAD REVISION/REPAIR;  Surgeon: Thompson Grayer, MD;  Location: [facility];  Service: Cardiovascular;  Laterality: N/A;   PACEMAKER PLACEMENT Bilateral    '05   PPM GENERATOR CHANGEOUT N/A 10/15/2020   Procedure: PPM GENERATOR CHANGEOUT;  Surgeon: Thompson Grayer, MD;  Location: [place] [facility];  Service: Cardiovascular;  Laterality: N/A;   Family History  Problem Relation Age of Onset   Diabetes Mother    Hypertension Mother  Hypertension Father    Lung cancer Father    Cancer Father    Asthma Sister    Hypertension Sister    Arthritis Paternal Grandmother    Hypertension Brother    Hypertension Daughter    Hypertension Son    Social History   Socioeconomic History   Marital status: Married    Spouse name: Not on file   Number of children: 4   Years of education: Not on file   Highest education level: Not on file  Occupational History   Occupation: retired    Fish farm manager: RETIRED  Tobacco Use   Smoking status: Former    Packs/day: 0.50    Years: 20.00    Total pack years: 10.00    Types: Cigarettes    Quit date: 11/17/1991    Years since quitting: 30.7   Smokeless tobacco: Former    Types: Nurse, children's Use: Never used  Substance and Sexual Activity   Alcohol use: No   Drug use: No   Sexual activity: Never  Other Topics Concern   Not on file  Social History Narrative   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    Caffeine 1-2 day cups   Right handed   Tri-level home       Social Determinants of Health   Financial Resource Strain: Low Risk  (08/03/2022)   Overall Financial Resource Strain (CARDIA)    Difficulty of Paying Living Expenses: Not hard at all  Food Insecurity: No Food Insecurity (08/03/2022)   Hunger Vital Sign    Worried About Running Out of Food in the Last Year: Never true    Ran Out of Food in the Last Year: Never true  Transportation Needs: No Transportation Needs (08/03/2022)   PRAPARE - Hydrologist (Medical): No    Lack of Transportation (Non-Medical): No  Physical Activity: Inactive (08/03/2022)   Exercise Vital Sign    Days of Exercise per Week: 0 days    Minutes of Exercise per Session: 0 min  Stress: No Stress Concern Present (08/03/2022)   [place]    Feeling of Stress : Not at all  Social Connections: [place] (08/03/2022)   Social Connection and Isolation Panel [NHANES]    Frequency of Communication with Friends and Family: More than three times a week    Frequency of Social Gatherings with Friends and Family: More than three times a week    Attends Religious Services: More than 4 times per year    Active Member of [employer] or Organizations: Yes    Attends Music therapist: More than 4 times per year    Marital Status: Married    Tobacco Counseling Counseling given: Not Answered   Clinical Intake:  Pre-visit preparation completed: Yes  Pain : 0-10 Pain Type: Chronic pain Pain Location: Generalized (all over the body arthirtis) Pain Orientation: Other (Comment) (generalized) Pain Onset: More than a month ago Pain Frequency: Constant Pain Relieving Factors: Tylenol Arthritis Effect of Pain on Daily Activities: Pain can diminish job performance, lower motivation to exercise and prevent you from completing daily tasks.  Pain produces disability and affects the quality of life.  Pain Relieving Factors:  Tylenol Arthritis  BMI - recorded: 31.47 Nutritional Risks: None Diabetes: No  How often do you need to have someone help you when you read instructions, pamphlets, or other written materials from your doctor or pharmacy?: 1 - Never  What is the last grade level you completed in school?: HSG  Diabetic? no  Interpreter Needed?: No  Information entered by :: Lisette Abu, LPN.   Activities of Daily Living    08/03/2022    4:03 PM 05/19/2022    4:00 PM  In your present state of health, do you have any difficulty performing the following activities:  Hearing? 0 0  Vision? 0 1  Difficulty concentrating or making decisions? 0 0  Walking or climbing stairs? 0 1  Dressing or bathing? 0 0  Doing errands, shopping? 0 0  Preparing Food and eating ? N   Using the Toilet? N   In the past six months, have you accidently leaked urine? N   Do you have problems with loss of bowel control? N   Managing your Medications? N   Managing your Finances? N   Housekeeping or managing your Housekeeping? N     Patient Care Team: Biagio Borg, MD as PCP - Bethena Roys, MD as PCP - Electrophysiology (Cardiology) Early Osmond, MD as PCP - Cardiology (Cardiology) Thompson Grayer, MD as Attending Physician (Cardiology) Clent Jacks, MD as Consulting Physician (Ophthalmology) [facility], [facility] as Consulting Physician (Urology)  Indicate any recent Medical Services you may have received from other than [facility] providers in the past year (date may be approximate).     Assessment:   This is a routine wellness examination for Evin.  Hearing/Vision screen Hearing Screening - Comments:: Denies any hearing difficulties.  Vision Screening - Comments:: Patient wears rx glasses - up to date with routine eye exams with Dr. Clent Jacks   Dietary issues and exercise activities discussed: Current Exercise Habits: The patient does not participate in regular exercise at present,  Exercise limited by: respiratory conditions(s);cardiac condition(s)   Goals Addressed   None   Depression Screen    08/03/2022    3:56 PM 04/15/2022    1:07 PM 03/24/2022    1:19 PM 11/24/2021    8:53 AM 05/20/2021    9:35 AM 05/20/2021    8:38 AM 11/18/2020   11:06 AM  PHQ 2/9 Scores  PHQ - 2 Score '3 3 5 '$ 0 1 0 0  PHQ- 9 Score  12 19        Fall Risk    08/03/2022    4:03 PM 07/15/2022    9:10 AM 04/15/2022    1:07 PM 03/24/2022    1:19 PM 11/24/2021    8:53 AM  Fall Risk   Falls in the past year? 0 0 0 0 0  Number falls in past yr: 0 0 0 0 0  Injury with Fall? 0 0 0 0 0  Risk for fall due to : No Fall Risks      Follow up Falls prevention discussed        FALL RISK PREVENTION PERTAINING TO THE HOME:  Any stairs in or around the home? Yes  If so, are there any without handrails? No  Home free of loose throw rugs in walkways, pet beds, electrical cords, etc? Yes  Adequate lighting in your home to reduce risk of falls? Yes   ASSISTIVE DEVICES UTILIZED TO PREVENT FALLS:  Life alert? No  Use of a cane, walker or w/c? No  Grab bars in the bathroom? Yes  Shower chair or bench in shower? Yes  Elevated toilet seat or a handicapped toilet? No   TIMED UP AND GO: Phone Visit  Was the test performed?  No .   Cognitive Function:    12/16/2017    8:45 AM  MMSE - Mini Mental State Exam  Orientation to time 5  Orientation to Place 5  Registration 3  Attention/ Calculation 5  Recall 2  Language- name 2 objects 2  Language- repeat 1  Language- follow 3 step command 3  Language- read & follow direction 1  Write a sentence 1  Copy design 1  Total score 29        08/03/2022    4:05 PM 02/08/2020    8:49 AM  6CIT Screen  What Year? 0 points 0 points  What month? 0 points 0 points  What time? 0 points 0 points  Count back from 20 0 points 0 points  Months in reverse 0 points 0 points  Repeat phrase 0 points 0 points  Total Score 0 points 0 points     Immunizations Immunization History  Administered Date(s) Administered   Fluad Quad(high Dose 65+) 07/16/2019, 08/08/2021   Influenza Split 08/16/2012, 08/17/2012, 08/16/2013   Influenza Whole 08/16/2010, 08/17/2011   Influenza, High Dose Seasonal PF 08/26/2014, 07/18/2017, 07/18/2018   Influenza,inj,Quad PF,6+ Mos 07/14/2019   Influenza-Unspecified 08/11/2015, 07/17/2016, 06/21/2017, 05/25/2018, 08/12/2020   Moderna Covid-19 Vaccine Bivalent Booster 14yr & up 08/18/2021   Moderna Sars-Covid-2 Vaccination 11/16/2018, 11/16/2018   PFIZER Comirnaty(Gray Top)Covid-19 Tri-Sucrose Vaccine 03/09/2021   PFIZER(Purple Top)SARS-COV-2 Vaccination 12/18/2019, 01/08/2020, 08/19/2020   PNEUMOCOCCAL CONJUGATE-20 03/09/2021   Pfizer Covid-19 Vaccine Bivalent Booster 173yr& up 08/18/2021   Pneumococcal Conjugate-13 09/26/2013   Pneumococcal Polysaccharide-23 10/25/2006, 09/16/2010   Td 05/27/2009   Tdap 11/07/2019   Zoster Recombinat (Shingrix) 12/19/2018, 05/27/2019   Zoster, Live 10/25/2006    TDAP status: Up to date  Flu Vaccine status: Due, Education has been provided regarding the importance of this vaccine. Advised may receive this vaccine at local pharmacy or Health Dept. Aware to provide a copy of the vaccination record if obtained from local pharmacy or Health Dept. Verbalized acceptance and understanding.  Pneumococcal vaccine status: Up to date  Covid-19 vaccine status: Completed vaccines  Qualifies for Shingles Vaccine? Yes   Zostavax completed Yes   Shingrix Completed?: Yes  Screening Tests Health Maintenance  Topic Date Due   COVID-19 Vaccine (8 - Mixed Product risk series) 10/13/2021   INFLUENZA VACCINE  06/16/2022   COLONOSCOPY (Pts [id number] coverage will need to be confirmed)  12/29/2022   TETANUS/TDAP  11/06/2029   Pneumonia Vaccine 65+11ears old  Completed   Hepatitis C Screening  Completed   Zoster Vaccines- Shingrix  Completed   HPV VACCINES  Aged  Out    Health Maintenance  Health Maintenance Due  Topic Date Due   COVID-19 Vaccine (8 - Mixed Product risk series) 10/13/2021   INFLUENZA VACCINE  06/16/2022    Colorectal cancer screening: No longer required.   Lung Cancer Screening: (Low Dose CT Chest recommended if Age 36-52-80ars, 30 pack-year currently smoking OR have quit w/in 15years.) does not qualify.   Lung Cancer Screening Referral: no  Additional Screening:  Hepatitis C Screening: does qualify; Completed 05/28/2020  Vision Screening: Recommended annual ophthalmology exams for early detection of glaucoma and other disorders of the eye. Is the patient up to date with their annual eye exam?  Yes  Who is the provider or what is the name of the office in which the patient attends annual eye exams? [facility]. If pt is not established with a provider, would they like to  be referred to a provider to establish care? No .   Dental Screening: Recommended annual dental exams for proper oral hygiene  Community Resource Referral / Chronic Care Management: CRR required this visit?  No   CCM required this visit?  No      Plan:     I have personally reviewed and noted the following in the patient's chart:   Medical and social history Use of alcohol, tobacco or illicit drugs  Current medications and supplements including opioid prescriptions. Patient is not currently taking opioid prescriptions. Functional ability and status Nutritional status Physical activity Advanced directives List of other physicians Hospitalizations, surgeries, and ER visits in previous 12 months Vitals Screenings to include cognitive, depression, and falls Referrals and appointments  In addition, I have reviewed and discussed with patient certain preventive protocols, quality metrics, and best practice recommendations. A written personalized care plan for preventive services as well as general preventive health recommendations were  provided to patient.     Sheral Flow, LPN   8/59/0931   Nurse Notes:

## 2022-08-04 ENCOUNTER — Telehealth: Payer: Self-pay | Admitting: Internal Medicine

## 2022-08-04 NOTE — Telephone Encounter (Signed)
Early Osmond, MD  Rodman Key, RN Caller: Unspecified (Today,  9:57 AM) [employer] job.  Let's continue to hold his lasix and HTN meds.  Let's get him in to see me next week or so.            Called and spoke w patient.  Voices understanding and agreement w plan.  Scheduled 08/12/22.

## 2022-08-04 NOTE — Telephone Encounter (Signed)
Pt c/o medication issue:  1. Name of Medication:   furosemide (LASIX) 40 MG tablet    2. How are you currently taking this medication (dosage and times per day)? Take 1 tablet (40 mg total) by mouth 2 (two) times daily.  3. Are you having a reaction (difficulty breathing--STAT)? No  4. What is your medication issue? Pt states that he was told to call if medication was not working. Pt also states that he is not feeling any better. Please advise

## 2022-08-04 NOTE — Telephone Encounter (Signed)
Returned call to patient. He is reporting on the effect of increasing lasix to 40 mg BID as planned at last ov.  The swelling in feet/legs is better but his breathing and energy level are no better. His BP went as low as 87/67 and he felt like he may pass out.  He has not taken any BP meds since Saturday.  Yesterday his BP was 110/79, today: 116/79.  He is taking amlodipine, micardis and Toprol XL at bedtime.  He has taken BP closer to bedtime and it is about the same as mornings.  He has gone back to his prior dose of Lasix 20 mg BID.    He is going to hold all BP meds until message is reviewed by Dr. Ali Lowe and then I will call him with recommendations.  We discussed the ezetimibe and Jardiance.  He will continue these for now.  He discussed the cost of Jardiance.  ($600) I offered # for patient assistance.  The pt said he will find the number and call about that if he is to continue Jardiance after Dr. Ali Lowe reviews.

## 2022-08-10 ENCOUNTER — Encounter: Payer: Self-pay | Admitting: Internal Medicine

## 2022-08-10 ENCOUNTER — Ambulatory Visit (INDEPENDENT_AMBULATORY_CARE_PROVIDER_SITE_OTHER): Payer: Medicare Other | Admitting: Internal Medicine

## 2022-08-10 VITALS — BP 140/78 | HR 59 | Temp 97.8°F | Ht 68.0 in | Wt 202.5 lb

## 2022-08-10 DIAGNOSIS — R739 Hyperglycemia, unspecified: Secondary | ICD-10-CM | POA: Diagnosis not present

## 2022-08-10 DIAGNOSIS — I10 Essential (primary) hypertension: Secondary | ICD-10-CM

## 2022-08-10 DIAGNOSIS — I2510 Atherosclerotic heart disease of native coronary artery without angina pectoris: Secondary | ICD-10-CM | POA: Diagnosis not present

## 2022-08-10 DIAGNOSIS — Z23 Encounter for immunization: Secondary | ICD-10-CM | POA: Diagnosis not present

## 2022-08-10 DIAGNOSIS — E785 Hyperlipidemia, unspecified: Secondary | ICD-10-CM | POA: Diagnosis not present

## 2022-08-10 NOTE — Progress Notes (Signed)
Remote pacemaker transmission.   

## 2022-08-10 NOTE — Assessment & Plan Note (Signed)
Lab Results  Component Value Date   LDLCALC 89 06/01/2022   Uncontrolled, goal ldl < 70, pt to continue current lipitor 80 mg and zetia 10 mg and lower chol diet

## 2022-08-10 NOTE — Assessment & Plan Note (Signed)
Lab Results  Component Value Date   HGBA1C 6.6 (H) 06/01/2022   Stable, pt to continue current DM diet, but I asked him to check insurance to see if [facility] [place]ade is better covered with his insurance

## 2022-08-10 NOTE — Assessment & Plan Note (Signed)
overcontrolled recently, now increased again - for restart micardis 20, and toprol xl 25 qd, lasix prn but to remain off amlodipine; has f/u with cardiology sept 27

## 2022-08-10 NOTE — Progress Notes (Signed)
Chief Complaint: follow up HTN overcontrolled       HPI:  [name] is a 81 y.o. male here with c/o mulitpile BP low last wk in the 80's, so called and was asked to stop his antiHTN meds per cardiology - pt stopped micardis 20, toprol xl 25, amlodipine and lasix.  Pt denies chest pain, increased sob or doe, wheezing, orthopnea, PND, increased LE swelling, palpitations, dizziness or syncope.   Pt denies polydipsia, polyuria, or new focal neuro s/s.    Pt denies fever, wt loss, night sweats, loss of appetite, or other constitutional symptoms  Also prescribed jardiance but did not start after found it was $600 for 1 mo         Wt Readings from Last 3 Encounters:  08/10/22 202 lb 8 oz (91.9 kg)  08/03/22 207 lb (93.9 kg)  07/29/22 207 lb (93.9 kg)   BP Readings from Last 3 Encounters:  08/10/22 (!) 140/78  07/29/22 98/70  07/21/22 104/70         Past Medical History:  Diagnosis Date   Abdominal pain, epigastric    Allergic rhinitis, cause unspecified    Arthritis    BPH (benign prostatic hyperplasia)    CHF (congestive heart failure) (HCC)    Chronic anticoagulation    xarelto   Coronary artery calcification seen on CAT scan 12/26/2012   Depressive disorder, not elsewhere classified    Diverticulosis of colon (without mention of hemorrhage)    Esophageal reflux    High risk medication use    Hyperlipidemia    Hyperplastic rectal polyp    Hypertension    Lumbago    Pacemaker    Paralyzed hemidiaphragm    LEFT   Persistent atrial fibrillation ([place])    Sick sinus syndrome ([place]) 05/16/2009   s/p PPM ([employer]) by [name]   Past Surgical History:  Procedure Laterality Date   CYSTOSCOPY WITH RETROGRADE PYELOGRAM, URETEROSCOPY AND STENT PLACEMENT Left 12/30/2021   Procedure: CYSTOSCOPY WITH RETROGRADE PYELOGRAM, URETEROSCOPY;  Surgeon: Remi Haggard, MD;  Location: [facility];  Service: Urology;  Laterality: Left;   ESOPHAGOGASTRODUODENOSCOPY N/A  12/05/2013   Procedure: ESOPHAGOGASTRODUODENOSCOPY (EGD);  Surgeon: [name], MD;  Location: [facility];  Service: Endoscopy;  Laterality: N/A;   ESOPHAGOGASTRODUODENOSCOPY N/A 07/04/2015   Procedure: ESOPHAGOGASTRODUODENOSCOPY (EGD);  Surgeon: [name], MD;  Location: [facility];  Service: Endoscopy;  Laterality: N/A;   EXTRACORPOREAL SHOCK WAVE LITHOTRIPSY Left 10/13/2021   Procedure: EXTRACORPOREAL SHOCK WAVE LITHOTRIPSY (ESWL);  Surgeon: Lucas Mallow, MD;  Location: [facility];  Service: Urology;  Laterality: Left;  90 MINS   INSERT / REPLACE / REMOVE PACEMAKER  05/2009    [employer] [id number]  (serial number [id number]) dual-chamber pacemaker   LEAD REVISION/REPAIR N/A 10/15/2020   Procedure: LEAD REVISION/REPAIR;  Surgeon: Thompson Grayer, MD;  Location: [facility];  Service: Cardiovascular;  Laterality: N/A;   PACEMAKER PLACEMENT Bilateral    '05   PPM GENERATOR CHANGEOUT N/A 10/15/2020   Procedure: PPM GENERATOR CHANGEOUT;  Surgeon: Thompson Grayer, MD;  Location: [facility];  Service: Cardiovascular;  Laterality: N/A;    reports that he quit smoking about 30 years ago. His smoking use included cigarettes. He has a 10.00 pack-year smoking history. He has quit using smokeless tobacco.  His smokeless tobacco use included chew. He reports that he does not drink alcohol and does not use drugs.  family history includes Arthritis in his paternal grandmother; Asthma in his sister; Cancer in his father; Diabetes in his mother; Hypertension in his brother, daughter, father, mother, sister, and son; Lung cancer in his father. No Known Allergies Current Outpatient Medications on File Prior to Visit  Medication Sig Dispense Refill   acetaminophen (TYLENOL) 650 MG CR tablet Take 650 mg by mouth in the morning and at bedtime. Takes two time daily     amLODipine (NORVASC) 10 MG tablet Take 1 tablet (10 mg total) by mouth at bedtime. 90 tablet 3    atorvastatin (LIPITOR) 80 MG tablet TAKE 1 TABLET BY MOUTH EVERY DAY (Patient taking differently: Take 80 mg by mouth daily.) 90 tablet 3   Budeson-Glycopyrrol-Formoterol (BREZTRI AEROSPHERE) 160-9-4.8 MCG/ACT AERO 1 puffs twice per day 10.7 g 11   Cholecalciferol (VITAMIN D3) 50 MCG (2000 UT) TABS Take 2,000 Units by mouth daily.     dorzolamide-timolol (COSOPT) 22.3-6.8 MG/ML ophthalmic solution Place 1 drop into both eyes in the morning and at bedtime.     empagliflozin (JARDIANCE) 10 MG TABS tablet Take 1 tablet (10 mg total) by mouth daily before breakfast. 90 tablet 3   ezetimibe (ZETIA) 10 MG tablet Take 1 tablet (10 mg total) by mouth daily. 90 tablet 3   fexofenadine (ALLEGRA) 180 MG tablet TAKE 1 TABLET (180 MG TOTAL) BY MOUTH DAILY. (Patient taking differently: Take 180 mg by mouth daily.) 90 tablet 1   flecainide (TAMBOCOR) 100 MG tablet TAKE 1 TABLET BY MOUTH TWICE A DAY (Patient taking differently: Take 100 mg by mouth 2 (two) times daily.) 180 tablet 3   furosemide (LASIX) 40 MG tablet Take 1 tablet (40 mg total) by mouth 2 (two) times daily. 90 tablet 1   latanoprost (XALATAN) 0.005 % ophthalmic solution Place 1 drop into both eyes at bedtime.      meclizine (ANTIVERT) 12.5 MG tablet TAKE 1 TABLET EVERY 8 HOURS AS NEEDED 30 tablet 5   metoprolol succinate (TOPROL XL) 25 MG 24 hr tablet Take 1 tablet (25 mg total) by mouth at bedtime. 90 tablet 2   omeprazole (PRILOSEC) 40 MG capsule TAKE 1 CAPSULE BY MOUTH EVERY DAY AS NEEDED (Patient taking differently: Take 40 mg by mouth daily.) 90 capsule 2   psyllium (METAMUCIL) 0.52 g capsule Take 2 capsules by mouth daily.     rivaroxaban (XARELTO) 20 MG TABS tablet TAKE 1 TABLET BY MOUTH DAILY WITH SUPPER. (Patient taking differently: Take 20 mg by mouth daily with supper.) 90 tablet 3   tamsulosin (FLOMAX) 0.4 MG CAPS capsule TAKE 1 CAPSULE BY MOUTH TWICE A DAY 180 capsule 3   telmisartan (MICARDIS) 20 MG tablet Take 1 tablet (20 mg total) by  mouth at bedtime. 90 tablet 2   traMADol (ULTRAM) 50 MG tablet TAKE 1 TABLET BY MOUTH EVERY 6 HOURS AS NEEDED. FOR PAIN (Patient taking differently: Take 50 mg by mouth every 6 (six) hours as needed for severe pain.) 120 tablet 2   No current facility-administered medications on file prior to visit.        ROS:  All others reviewed and negative.  Objective        PE:  BP (!) 140/78   Pulse (!) 59   Temp 97.8 F (36.6 C) (Oral)   Ht '5\' 8"'$  (1.727 m)   Wt 202 lb 8 oz (91.9 kg)   SpO2 97%   BMI 30.79 kg/m  Constitutional: Pt appears in NAD               HENT: Head: NCAT.                Right Ear: External ear normal.                 Left Ear: External ear normal.                Eyes: . Pupils are equal, round, and reactive to light. Conjunctivae and EOM are normal               Nose: without d/c or deformity               Neck: Neck supple. Gross normal ROM               Cardiovascular: Normal rate and regular rhythm.                 Pulmonary/Chest: Effort normal and breath sounds without rales or wheezing.                Abd:  Soft, NT, ND, + BS, no organomegaly               Neurological: Pt is alert. At baseline orientation, motor grossly intact               Skin: Skin is warm. No rashes, no other new lesions, LE edema - trace RLE               Psychiatric: Pt behavior is normal without agitation   Micro: none  Cardiac tracings I have personally interpreted today:  none  Pertinent Radiological findings (summarize): none   Lab Results  Component Value Date   WBC 7.9 06/01/2022   HGB 14.8 06/01/2022   HCT 44.2 06/01/2022   PLT 191.0 06/01/2022   GLUCOSE 125 (H) 06/01/2022   CHOL 163 06/01/2022   TRIG 72.0 06/01/2022   HDL 59.90 06/01/2022   LDLDIRECT 183.4 12/26/2013   LDLCALC 89 06/01/2022   ALT 64 (H) 06/01/2022   AST 40 (H) 06/01/2022   NA 133 (L) 06/01/2022   K 3.6 06/01/2022   CL 91 (L) 06/01/2022   CREATININE 1.46 06/01/2022   BUN 26 (H)  06/01/2022   CO2 29 06/01/2022   TSH 1.29 06/01/2022   PSA 2.01 11/24/2021   INR 2.2 (H) 12/09/2020   HGBA1C 6.6 (H) 06/01/2022   Assessment/Plan:  [name] is a 81 y.o. Black or African American [2] male with  has a past medical history of Abdominal pain, epigastric, Allergic rhinitis, cause unspecified, Arthritis, BPH (benign prostatic hyperplasia), CHF (congestive heart failure) ([place]), Chronic anticoagulation, Coronary artery calcification seen on CAT scan (12/26/2012), Depressive disorder, not elsewhere classified, Diverticulosis of colon (without mention of hemorrhage), Esophageal reflux, High risk medication use, Hyperlipidemia, Hyperplastic rectal polyp, Hypertension, Lumbago, Pacemaker, Paralyzed hemidiaphragm, Persistent atrial fibrillation ([place]), and Sick sinus syndrome ([place]) (05/16/2009).  Essential hypertension overcontrolled recently, now increased again - for restart micardis 20, and toprol xl 25 qd, lasix prn but to remain off amlodipine; has f/u with cardiology sept 27  Hyperglycemia Lab Results  Component Value Date   HGBA1C 6.6 (H) 06/01/2022   Stable, pt to continue current DM diet, but I asked him to check insurance to see if Wilder Glade is better covered with his insurance   Hyperlipidemia Lab Results  Component Value Date   [place] 89 06/01/2022   Uncontrolled,  goal ldl < 70, pt to continue current lipitor 80 mg and zetia 10 mg and lower chol diet  Followup: Return in about 4 months (around 12/10/2022).  Cathlean Cower, MD 08/10/2022 8:24 PM [facility]

## 2022-08-10 NOTE — Patient Instructions (Signed)
Ok to restart the toprol xl 25 mg per day, and the telmisartan (micardis) 20 mg per day  Ok to stop the amlodipine 10 mg for now  Please only take the lasix for worsening swelling as needed for now  Please check to see if Wilder Glade is better covered by the insurance than Jardiance  Please continue all other medications as before, and refills have been done if requested.  Please have the pharmacy call with any other refills you may need.  Please keep your appointments with your specialists as you may have planned  Please make an Appointment to return in 4 months, or sooner if needed

## 2022-08-11 NOTE — Progress Notes (Unsigned)
Cardiology Office Note:    Date:  08/12/2022   ID:  [name], DOB [birth year], MRN [id number]  PCP:  Biagio Borg, MD   [facility] Providers Cardiologist:  Lenna Sciara, MD Referring MD: Biagio Borg, MD   Chief Complaint/Reason for Referral: Cardiology follow up  ASSESSMENT:    1. Chronic diastolic CHF (congestive heart failure) (HCC)   2. Paroxysmal atrial fibrillation ([place])   3. Sick sinus syndrome ([place])   4. Hypertension, unspecified type   5. Aortic atherosclerosis ([place])   6. Coronary artery calcification seen on CAT scan   7. Hyperlipidemia LDL goal <70   8. Stage 3 chronic kidney disease, unspecified whether stage 3a or 3b CKD ([place])   9. Chronic obstructive pulmonary disease, unspecified COPD type ([place])       PLAN:    In order of problems listed above: 1.  Chronic diastolic heart failure: I think most of his shortness of breath is from his elevated left hemidiaphragm and low FEV1.  He is euvolemic on exam today.  We will continue current medical therapy.  Follow-up in 9 months or earlier if needed. 2.  Paroxysmal atrial fibrillation: Continue Toprol and Xarelto. 3.  Sick sinus syndrome: Follows with EP 4.  Hypertension: Blood pressure is under good control today. 5.  Aortic atherosclerosis: Continue atorvastatin and Xarelto and lieu of aspirin.   6.  Coronary artery calcification: CT FFR was negative in 2022.  Continue aspirin and statin. 7.  Hyperlipidemia: Goal LDL is less than 70.  Lipid panel, LFTs, and LP(a)  today.  The patient has issues with the cost of Zetia.  If he is not at goal I will change him to Crestor and stop Zetia entirely. 8.  Chronic kidney disease: GFR currently is 60 with a creatinine of 1.12 monitor for now.   9.  COPD: FEV1 of close to 1.  He is followed by pulmonology.            Dispo:  Return in about 9 months (around 05/13/2023).      Medication Adjustments/Labs and Tests Ordered: Current medicines are reviewed at length  with the patient today.  Concerns regarding medicines are outlined above.  The following changes have been made:     Labs/tests ordered: Orders Placed This Encounter  Procedures   Lipoprotein A (LPA)   Lipid panel   Hepatic function panel    Medication Changes: No orders of the defined types were placed in this encounter.    Current medicines are reviewed at length with the patient today.  The patient does not have concerns regarding medicines.   History of Present Illness:    FOCUSED PROBLEM LIST:   1.  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  Paroxysmal atrial fibrillation on Xarelto 3.  Coronary artery calcification with negative CT FFR 2022 4.  Aortic atherosclerosis on CT FFR 2022 5.  Hypertension 6.  Hyperlipidemia 7.  Severe obstructive disease on PFTs 2021 with FEV1 around 1 L 8.  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  Chronic kidney disease stage III  May 2023: Seen for initial consultation to establish general cardiovascular care.  Due to chronic diastolic heart failure his Lasix was increased to 20 mg twice daily and Jardiance 10 mg was started.  Additionally due to elevated diastolic pressure his Norvasc was increased to 10 mg.  An echocardiogram was obtained which demonstrated preserved ejection fraction with mild asymmetric left ventricular  hypertrophy and no significant valvular abnormalities.  September 2023: The patient was admitted in early July with failure to thrive.  He was found to have a left lower lobe pneumonia and was treated.  Patient was seen by pulmonary in the interim.  PFTs were done which put to be relatively unchanged but not yet been read.  His FEV1 is still around 1.  A sleep study was ordered due to excessive fatigue and daytime somnolence.  He continues to be plagued with shortness of breath.  This does not occur at rest.  He also finds himself developing orthopnea and paroxysmal  nocturnal dyspnea maybe once a week.  He has some chronic ankle swelling which is likely due to his amlodipine.  He does not note any significant peripheral edema.  When he does get short of breath he does not notice any wheezing.  He denies any exertional angina, severe bleeding or bruising, signs or symptoms of stroke, or need for emergency room visits or hospitalizations since discharge.  Plan: Start Jardiance 10 mg daily and increase Lasix to 40 mg for 3 days and add Zetia 10 mg.  Today: Due to low blood pressures the patient's Lasix was held as well as his antihypertensive medications.  He was seen by his primary care doctor yesterday and his Micardis and Toprol-XL were restarted due to elevated blood pressures.  Lasix was changed to as needed.  His amlodipine was discontinued.  The patient tells me that the Lasix even at increased doses did not really change his breathing whatsoever.  He is happy that his blood pressures are under good control.  He is worried about the cost of Zetia.  He did not get his cholesterol blood test earlier this month as requested.  He is otherwise well and without significant complaints today.   Current Medications: Current Meds  Medication Sig   acetaminophen (TYLENOL) 650 MG CR tablet Take 650 mg by mouth in the morning and at bedtime. Takes two time daily   atorvastatin (LIPITOR) 80 MG tablet TAKE 1 TABLET BY MOUTH EVERY DAY   Budeson-Glycopyrrol-Formoterol (BREZTRI AEROSPHERE) 160-9-4.8 MCG/ACT AERO 1 puffs twice per day   Cholecalciferol (VITAMIN D3) 50 MCG (2000 UT) TABS Take 2,000 Units by mouth daily.   dorzolamide-timolol (COSOPT) 22.3-6.8 MG/ML ophthalmic solution Place 1 drop into both eyes in the morning and at bedtime.   empagliflozin (JARDIANCE) 10 MG TABS tablet Take 1 tablet (10 mg total) by mouth daily before breakfast.   ezetimibe (ZETIA) 10 MG tablet Take 1 tablet (10 mg total) by mouth daily.   fexofenadine (ALLEGRA) 180 MG tablet TAKE 1 TABLET  (180 MG TOTAL) BY MOUTH DAILY.   flecainide (TAMBOCOR) 100 MG tablet TAKE 1 TABLET BY MOUTH TWICE A DAY   latanoprost (XALATAN) 0.005 % ophthalmic solution Place 1 drop into both eyes at bedtime.    meclizine (ANTIVERT) 12.5 MG tablet TAKE 1 TABLET EVERY 8 HOURS AS NEEDED   metoprolol succinate (TOPROL XL) 25 MG 24 hr tablet Take 1 tablet (25 mg total) by mouth at bedtime.   omeprazole (PRILOSEC) 40 MG capsule TAKE 1 CAPSULE BY MOUTH EVERY DAY AS NEEDED   psyllium (METAMUCIL) 0.52 g capsule Take 2 capsules by mouth daily.   rivaroxaban (XARELTO) 20 MG TABS tablet TAKE 1 TABLET BY MOUTH DAILY WITH SUPPER.   tamsulosin (FLOMAX) 0.4 MG CAPS capsule TAKE 1 CAPSULE BY MOUTH TWICE A DAY   telmisartan (MICARDIS) 20 MG tablet Take 1 tablet (20 mg total) by  mouth at bedtime.   traMADol (ULTRAM) 50 MG tablet TAKE 1 TABLET BY MOUTH EVERY 6 HOURS AS NEEDED. FOR PAIN     Allergies:    Patient has no known allergies.   Social History:   Social History   Tobacco Use   Smoking status: Former    Packs/day: 0.50    Years: 20.00    Total pack years: 10.00    Types: Cigarettes    Quit date: 11/17/1991    Years since quitting: 30.7   Smokeless tobacco: Former    Types: Nurse, children's Use: Never used  Substance Use Topics   Alcohol use: No   Drug use: No     Family Hx: Family History  Problem Relation Age of Onset   Diabetes Mother    Hypertension Mother    Hypertension Father    Lung cancer Father    Cancer Father    Asthma Sister    Hypertension Sister    Arthritis Paternal Grandmother    Hypertension Brother    Hypertension Daughter    Hypertension Son      Review of Systems:   Please see the history of present illness.    All other systems reviewed and are negative.     EKGs/Labs/Other Test Reviewed:    EKG:  EKG performed November 2022 that I personally reviewed demonstrates atrial pacing with right bundle branch block Prior CV studies:  CTA FFR 2022 with  coronary artery calcification but normal FFR significant lesions  ABIs 2023 within normal range  TTE 2021 with ejection fraction of 50 to 55% with inferior basal hypokinesis.  No significant valvular abnormalities  Other studies Reviewed: Review of the additional studies/records demonstrates: Aortic atherosclerosis seen on CT of abdomen and pelvis 2022  Recent Labs: 05/18/2022: B Natriuretic Peptide 11.9 06/01/2022: ALT 64; BUN 26; Creatinine, Ser 1.46; Hemoglobin 14.8; Platelets 191.0; Potassium 3.6; Pro B Natriuretic peptide (BNP) 19.0; Sodium 133; TSH 1.29   Recent Lipid Panel Lab Results  Component Value Date/Time   CHOL 163 06/01/2022 10:04 AM   TRIG 72.0 06/01/2022 10:04 AM   TRIG 31 10/25/2006 09:35 AM   HDL 59.90 06/01/2022 10:04 AM   LDLCALC 89 06/01/2022 10:04 AM   LDLDIRECT 183.4 12/26/2013 09:11 AM    Risk Assessment/Calculations:     CHA2DS2-VASc Score = 5   This indicates a 7.2% annual risk of stroke. The patient's score is based upon: CHF History: 1 HTN History: 1 Diabetes History: 0 Stroke History: 0 Vascular Disease History: 1 Age Score: 2 Gender Score: 0         Physical Exam:    VS:  BP 120/80 (BP Location: Left Arm, Patient Position: Sitting, Cuff Size: Normal)   Pulse 64   Ht '5\' 8"'$  (1.727 m)   Wt 204 lb (92.5 kg)   SpO2 97%   BMI 31.02 kg/m    Wt Readings from Last 3 Encounters:  08/12/22 204 lb (92.5 kg)  08/10/22 202 lb 8 oz (91.9 kg)  08/03/22 207 lb (93.9 kg)    GENERAL:  No apparent distress, AOx3 HEENT:  No carotid bruits, +2 carotid impulses, no scleral icterus CAR: RRR no murmurs, gallops, rubs, or thrills RES:  Clear to auscultation bilaterally ABD:  Soft, nontender, nondistended, positive bowel sounds x 4 VASC:  +2 radial pulses, +2 carotid pulses, palpable pedal pulses NEURO:  CN 2-12 grossly intact; motor and sensory grossly intact PSYCH:  No active depression or anxiety EXT:  No edema, ecchymosis, or  cyanosis  Signed, Early Osmond, MD  08/12/2022 3:56 PM    [facility] [address], [place], [place]  [address] Phone: [phone]; Fax: [phone]   Note:  This document was prepared using Dragon voice recognition software and may include unintentional dictation errors.

## 2022-08-11 NOTE — H&P (View-Only) (Signed)
Cardiology Office Note:    Date:  08/12/2022   ID:  [name], DOB 24-Jul-1941, MRN [id number]  PCP:  Biagio Borg, MD   [facility] Providers Cardiologist:  Lenna Sciara, MD Referring MD: Biagio Borg, MD   Chief Complaint/Reason for Referral: Cardiology follow up  ASSESSMENT:    1. Chronic diastolic CHF (congestive heart failure) (HCC)   2. Paroxysmal atrial fibrillation ([place])   3. Sick sinus syndrome ([place])   4. Hypertension, unspecified type   5. Aortic atherosclerosis ([place])   6. Coronary artery calcification seen on CAT scan   7. Hyperlipidemia LDL goal <70   8. Stage 3 chronic kidney disease, unspecified whether stage 3a or 3b CKD ([place])   9. Chronic obstructive pulmonary disease, unspecified COPD type ([place])       PLAN:    In order of problems listed above: 1.  Chronic diastolic heart failure: I think most of his shortness of breath is from his elevated left hemidiaphragm and low FEV1.  He is euvolemic on exam today.  We will continue current medical therapy.  Follow-up in 9 months or earlier if needed. 2.  Paroxysmal atrial fibrillation: Continue Toprol and Xarelto. 3.  Sick sinus syndrome: Follows with EP 4.  Hypertension: Blood pressure is under good control today. 5.  Aortic atherosclerosis: Continue atorvastatin and Xarelto and lieu of aspirin.   6.  Coronary artery calcification: CT FFR was negative in 2022.  Continue aspirin and statin. 7.  Hyperlipidemia: Goal LDL is less than 70.  Lipid panel, LFTs, and LP(a)  today.  The patient has issues with the cost of Zetia.  If he is not at goal I will change him to Crestor and stop Zetia entirely. 8.  Chronic kidney disease: GFR currently is 60 with a creatinine of 1.12 monitor for now.   9.  COPD: FEV1 of close to 1.  He is followed by pulmonology.            Dispo:  Return in about 9 months (around 05/13/2023).      Medication Adjustments/Labs and Tests Ordered: Current medicines are reviewed at length  with the patient today.  Concerns regarding medicines are outlined above.  The following changes have been made:     Labs/tests ordered: Orders Placed This Encounter  Procedures   Lipoprotein A (LPA)   Lipid panel   Hepatic function panel    Medication Changes: No orders of the defined types were placed in this encounter.    Current medicines are reviewed at length with the patient today.  The patient does not have concerns regarding medicines.   History of Present Illness:    FOCUSED PROBLEM LIST:   1.  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  Paroxysmal atrial fibrillation on Xarelto 3.  Coronary artery calcification with negative CT FFR 2022 4.  Aortic atherosclerosis on CT FFR 2022 5.  Hypertension 6.  Hyperlipidemia 7.  Severe obstructive disease on PFTs 2021 with FEV1 around 1 L 8.  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  Chronic kidney disease stage III  May 2023: Seen for initial consultation to establish general cardiovascular care.  Due to chronic diastolic heart failure his Lasix was increased to 20 mg twice daily and Jardiance 10 mg was started.  Additionally due to elevated diastolic pressure his Norvasc was increased to 10 mg.  An echocardiogram was obtained which demonstrated preserved ejection fraction with mild asymmetric left ventricular  hypertrophy and no significant valvular abnormalities.  September 2023: The patient was admitted in early July with failure to thrive.  He was found to have a left lower lobe pneumonia and was treated.  Patient was seen by pulmonary in the interim.  PFTs were done which put to be relatively unchanged but not yet been read.  His FEV1 is still around 1.  A sleep study was ordered due to excessive fatigue and daytime somnolence.  He continues to be plagued with shortness of breath.  This does not occur at rest.  He also finds himself developing orthopnea and paroxysmal  nocturnal dyspnea maybe once a week.  He has some chronic ankle swelling which is likely due to his amlodipine.  He does not note any significant peripheral edema.  When he does get short of breath he does not notice any wheezing.  He denies any exertional angina, severe bleeding or bruising, signs or symptoms of stroke, or need for emergency room visits or hospitalizations since discharge.  Plan: Start Jardiance 10 mg daily and increase Lasix to 40 mg for 3 days and add Zetia 10 mg.  Today: Due to low blood pressures the patient's Lasix was held as well as his antihypertensive medications.  He was seen by his primary care doctor yesterday and his Micardis and Toprol-XL were restarted due to elevated blood pressures.  Lasix was changed to as needed.  His amlodipine was discontinued.  The patient tells me that the Lasix even at increased doses did not really change his breathing whatsoever.  He is happy that his blood pressures are under good control.  He is worried about the cost of Zetia.  He did not get his cholesterol blood test earlier this month as requested.  He is otherwise well and without significant complaints today.   Current Medications: Current Meds  Medication Sig   acetaminophen (TYLENOL) 650 MG CR tablet Take 650 mg by mouth in the morning and at bedtime. Takes two time daily   atorvastatin (LIPITOR) 80 MG tablet TAKE 1 TABLET BY MOUTH EVERY DAY   Budeson-Glycopyrrol-Formoterol (BREZTRI AEROSPHERE) 160-9-4.8 MCG/ACT AERO 1 puffs twice per day   Cholecalciferol (VITAMIN D3) 50 MCG (2000 UT) TABS Take 2,000 Units by mouth daily.   dorzolamide-timolol (COSOPT) 22.3-6.8 MG/ML ophthalmic solution Place 1 drop into both eyes in the morning and at bedtime.   empagliflozin (JARDIANCE) 10 MG TABS tablet Take 1 tablet (10 mg total) by mouth daily before breakfast.   ezetimibe (ZETIA) 10 MG tablet Take 1 tablet (10 mg total) by mouth daily.   fexofenadine (ALLEGRA) 180 MG tablet TAKE 1 TABLET  (180 MG TOTAL) BY MOUTH DAILY.   flecainide (TAMBOCOR) 100 MG tablet TAKE 1 TABLET BY MOUTH TWICE A DAY   latanoprost (XALATAN) 0.005 % ophthalmic solution Place 1 drop into both eyes at bedtime.    meclizine (ANTIVERT) 12.5 MG tablet TAKE 1 TABLET EVERY 8 HOURS AS NEEDED   metoprolol succinate (TOPROL XL) 25 MG 24 hr tablet Take 1 tablet (25 mg total) by mouth at bedtime.   omeprazole (PRILOSEC) 40 MG capsule TAKE 1 CAPSULE BY MOUTH EVERY DAY AS NEEDED   psyllium (METAMUCIL) 0.52 g capsule Take 2 capsules by mouth daily.   rivaroxaban (XARELTO) 20 MG TABS tablet TAKE 1 TABLET BY MOUTH DAILY WITH SUPPER.   tamsulosin (FLOMAX) 0.4 MG CAPS capsule TAKE 1 CAPSULE BY MOUTH TWICE A DAY   telmisartan (MICARDIS) 20 MG tablet Take 1 tablet (20 mg total) by  mouth at bedtime.   traMADol (ULTRAM) 50 MG tablet TAKE 1 TABLET BY MOUTH EVERY 6 HOURS AS NEEDED. FOR PAIN     Allergies:    Patient has no known allergies.   Social History:   Social History   Tobacco Use   Smoking status: Former    Packs/day: 0.50    Years: 20.00    Total pack years: 10.00    Types: Cigarettes    Quit date: 11/17/1991    Years since quitting: 30.7   Smokeless tobacco: Former    Types: Nurse, children's Use: Never used  Substance Use Topics   Alcohol use: No   Drug use: No     Family Hx: Family History  Problem Relation Age of Onset   Diabetes Mother    Hypertension Mother    Hypertension Father    Lung cancer Father    Cancer Father    Asthma Sister    Hypertension Sister    Arthritis Paternal Grandmother    Hypertension Brother    Hypertension Daughter    Hypertension Son      Review of Systems:   Please see the history of present illness.    All other systems reviewed and are negative.     EKGs/Labs/Other Test Reviewed:    EKG:  EKG performed November 2022 that I personally reviewed demonstrates atrial pacing with right bundle branch block Prior CV studies:  CTA FFR 2022 with  coronary artery calcification but normal FFR significant lesions  ABIs 2023 within normal range  TTE 2021 with ejection fraction of 50 to 55% with inferior basal hypokinesis.  No significant valvular abnormalities  Other studies Reviewed: Review of the additional studies/records demonstrates: Aortic atherosclerosis seen on CT of abdomen and pelvis 2022  Recent Labs: 05/18/2022: B Natriuretic Peptide 11.9 06/01/2022: ALT 64; BUN 26; Creatinine, Ser 1.46; Hemoglobin 14.8; Platelets 191.0; Potassium 3.6; Pro B Natriuretic peptide (BNP) 19.0; Sodium 133; TSH 1.29   Recent Lipid Panel Lab Results  Component Value Date/Time   CHOL 163 06/01/2022 10:04 AM   TRIG 72.0 06/01/2022 10:04 AM   TRIG 31 10/25/2006 09:35 AM   HDL 59.90 06/01/2022 10:04 AM   LDLCALC 89 06/01/2022 10:04 AM   LDLDIRECT 183.4 12/26/2013 09:11 AM    Risk Assessment/Calculations:     CHA2DS2-VASc Score = 5   This indicates a 7.2% annual risk of stroke. The patient's score is based upon: CHF History: 1 HTN History: 1 Diabetes History: 0 Stroke History: 0 Vascular Disease History: 1 Age Score: 2 Gender Score: 0         Physical Exam:    VS:  BP 120/80 (BP Location: Left Arm, Patient Position: Sitting, Cuff Size: Normal)   Pulse 64   Ht '5\' 8"'$  (1.727 m)   Wt 204 lb (92.5 kg)   SpO2 97%   BMI 31.02 kg/m    Wt Readings from Last 3 Encounters:  08/12/22 204 lb (92.5 kg)  08/10/22 202 lb 8 oz (91.9 kg)  08/03/22 207 lb (93.9 kg)    GENERAL:  No apparent distress, AOx3 HEENT:  No carotid bruits, +2 carotid impulses, no scleral icterus CAR: RRR no murmurs, gallops, rubs, or thrills RES:  Clear to auscultation bilaterally ABD:  Soft, nontender, nondistended, positive bowel sounds x 4 VASC:  +2 radial pulses, +2 carotid pulses, palpable pedal pulses NEURO:  CN 2-12 grossly intact; motor and sensory grossly intact PSYCH:  No active depression or anxiety EXT:  No edema, ecchymosis, or  cyanosis  Signed, Early Osmond, MD  08/12/2022 3:56 PM    [facility] [address], [place], [place]  [address] Phone: [phone]; Fax: [phone]   Note:  This document was prepared using Dragon voice recognition software and may include unintentional dictation errors.

## 2022-08-12 ENCOUNTER — Ambulatory Visit (INDEPENDENT_AMBULATORY_CARE_PROVIDER_SITE_OTHER): Payer: Medicare Other | Admitting: Internal Medicine

## 2022-08-12 ENCOUNTER — Encounter: Payer: Self-pay | Admitting: Internal Medicine

## 2022-08-12 ENCOUNTER — Ambulatory Visit (HOSPITAL_COMMUNITY): Payer: Medicare Other | Attending: Internal Medicine

## 2022-08-12 VITALS — BP 120/80 | HR 64 | Ht 68.0 in | Wt 204.0 lb

## 2022-08-12 DIAGNOSIS — N183 Chronic kidney disease, stage 3 unspecified: Secondary | ICD-10-CM | POA: Insufficient documentation

## 2022-08-12 DIAGNOSIS — I480 Paroxysmal atrial fibrillation: Secondary | ICD-10-CM | POA: Insufficient documentation

## 2022-08-12 DIAGNOSIS — E785 Hyperlipidemia, unspecified: Secondary | ICD-10-CM | POA: Insufficient documentation

## 2022-08-12 DIAGNOSIS — I700 Atherosclerosis of aorta: Secondary | ICD-10-CM

## 2022-08-12 DIAGNOSIS — I5032 Chronic diastolic (congestive) heart failure: Secondary | ICD-10-CM | POA: Diagnosis not present

## 2022-08-12 DIAGNOSIS — I495 Sick sinus syndrome: Secondary | ICD-10-CM | POA: Insufficient documentation

## 2022-08-12 DIAGNOSIS — I10 Essential (primary) hypertension: Secondary | ICD-10-CM

## 2022-08-12 DIAGNOSIS — J449 Chronic obstructive pulmonary disease, unspecified: Secondary | ICD-10-CM | POA: Diagnosis not present

## 2022-08-12 DIAGNOSIS — I2510 Atherosclerotic heart disease of native coronary artery without angina pectoris: Secondary | ICD-10-CM

## 2022-08-12 NOTE — Patient Instructions (Signed)
Medication Instructions:  No changes *If you need a refill on your cardiac medications before your next appointment, please call your pharmacy*   Lab Work: Today: lipids/liver/Lp(a)  If you have labs (blood work) drawn today and your tests are completely normal, you will receive your results only by: [place] (if you have MyChart) OR A paper copy in the mail If you have any lab test that is abnormal or we need to change your treatment, we will call you to review the results.   Testing/Procedures: none   Follow-Up: At [facility], you and your health needs are our priority.  As part of our continuing mission to provide you with exceptional heart care, we have created designated Provider Care Teams.  These Care Teams include your primary Cardiologist (physician) and Advanced Practice Providers (APPs -  Physician Assistants and Nurse Practitioners) who all work together to provide you with the care you need, when you need it.   Your next appointment:   9 month(s)  The format for your next appointment:   In Person  Provider:   Early Osmond, MD      Important Information About Sugar

## 2022-08-13 ENCOUNTER — Telehealth: Payer: Self-pay | Admitting: *Deleted

## 2022-08-13 DIAGNOSIS — E7841 Elevated Lipoprotein(a): Secondary | ICD-10-CM

## 2022-08-13 LAB — LIPID PANEL
Chol/HDL Ratio: 2.2 ratio (ref 0.0–5.0)
Cholesterol, Total: 127 mg/dL (ref 100–199)
HDL: 59 mg/dL (ref >39)
LDL Chol Calc (NIH): 52 mg/dL (ref 0–99)
Triglycerides: 84 mg/dL (ref 0–149)
VLDL Cholesterol Cal: 16 mg/dL (ref 5–40)

## 2022-08-13 LAB — HEPATIC FUNCTION PANEL
ALT: 27 IU/L (ref 0–44)
AST: 26 IU/L (ref 0–40)
Albumin: 4.5 g/dL (ref 3.8–4.8)
Alkaline Phosphatase: 82 IU/L (ref 44–121)
Bilirubin Total: 0.7 mg/dL (ref 0.0–1.2)
Bilirubin, Direct: 0.25 mg/dL (ref 0.00–0.40)
Total Protein: 7 g/dL (ref 6.0–8.5)

## 2022-08-13 LAB — LIPOPROTEIN A (LPA): Lipoprotein (a): 194.4 nmol/L — ABNORMAL HIGH (ref <75.0)

## 2022-08-13 NOTE — Telephone Encounter (Signed)
-----   Message from Early Osmond, MD sent at 08/13/2022  7:42 AM EDT ----- Please refer to Dr. Debara Pickett for elevated LP(a).

## 2022-08-13 NOTE — Telephone Encounter (Signed)
Spoke w patient and reviewed results and recommendations.  Pt in agreement and aware he will be called to schedule with Dr. Debara Pickett.

## 2022-08-14 LAB — ECHOCARDIOGRAM COMPLETE
Area-P 1/2: 2.83 cm2
S' Lateral: 2.8 cm
Single Plane A2C EF: 47.7 %

## 2022-08-26 ENCOUNTER — Encounter: Payer: Self-pay | Admitting: Internal Medicine

## 2022-08-26 NOTE — Telephone Encounter (Signed)
Please clarify what BP levels he is referring to, thanks

## 2022-08-27 DIAGNOSIS — Z23 Encounter for immunization: Secondary | ICD-10-CM | POA: Diagnosis not present

## 2022-08-28 MED ORDER — AMLODIPINE BESYLATE 5 MG PO TABS: 5.0000 mg | ORAL_TABLET | Freq: Every day | ORAL | 3 refills | Status: DC

## 2022-08-28 NOTE — Telephone Encounter (Signed)
Called pt and left detail voice for them to call back

## 2022-08-28 NOTE — Addendum Note (Signed)
Addended by: Biagio Borg on: 08/28/2022 11:44 AM   Modules accepted: Orders

## 2022-08-28 NOTE — Telephone Encounter (Signed)
Last night before bed was 145/96. It was 156/106 on Wednesday Morning. He took another while we were on the phone and it was 138/84, he said its been up and down. He did say he hasnt been taking the amlodipine because Dr Jenny Reichmann told him to stop. He was wondering if Dr Jenny Reichmann would like him to start back

## 2022-09-02 ENCOUNTER — Telehealth: Payer: Self-pay | Admitting: *Deleted

## 2022-09-02 ENCOUNTER — Telehealth: Payer: Self-pay | Admitting: Internal Medicine

## 2022-09-02 DIAGNOSIS — Z01812 Encounter for preprocedural laboratory examination: Secondary | ICD-10-CM

## 2022-09-02 DIAGNOSIS — I480 Paroxysmal atrial fibrillation: Secondary | ICD-10-CM

## 2022-09-02 NOTE — Telephone Encounter (Signed)
-----   Message from Early Osmond, MD sent at 08/16/2022  8:07 AM EDT ----- Please arrange RHC re dyspnea and elevated PA pressures.

## 2022-09-02 NOTE — Telephone Encounter (Signed)
Pt c/o medication issue:  1. Name of Medication: empagliflozin (JARDIANCE) 10 MG TABS tablet  2. How are you currently taking this medication (dosage and times per day)? 1 tablet daily  3. Are you having a reaction (difficulty breathing--STAT)? no  4. What is your medication issue? Patient states he was supposed to be given a 1-800, number for the medication.

## 2022-09-02 NOTE — Telephone Encounter (Signed)
Called patient back and provider him with: [name] ([place]) [phone] for assistance with [employer]

## 2022-09-02 NOTE — Telephone Encounter (Signed)
Spoke with patient and arranged right heart cath for 09/09/22 with Dr. Ali Lowe 11:30 am.  He will come tomorrow for blood work.  Reviewed cath instructions with him and released to [place].

## 2022-09-03 ENCOUNTER — Ambulatory Visit: Payer: Medicare Other | Attending: Internal Medicine

## 2022-09-03 DIAGNOSIS — Z01812 Encounter for preprocedural laboratory examination: Secondary | ICD-10-CM

## 2022-09-03 DIAGNOSIS — I480 Paroxysmal atrial fibrillation: Secondary | ICD-10-CM | POA: Diagnosis not present

## 2022-09-03 LAB — CBC
Hematocrit: 43.9 % (ref 37.5–51.0)
Hemoglobin: 14.9 g/dL (ref 13.0–17.7)
MCH: 30.8 pg (ref 26.6–33.0)
MCHC: 33.9 g/dL (ref 31.5–35.7)
MCV: 91 fL (ref 79–97)
Platelets: 159 10*3/uL (ref 150–450)
RBC: 4.84 x10E6/uL (ref 4.14–5.80)
RDW: 13.9 % (ref 11.6–15.4)
WBC: 7.6 10*3/uL (ref 3.4–10.8)

## 2022-09-03 LAB — BASIC METABOLIC PANEL
BUN/Creatinine Ratio: 18 (ref 10–24)
BUN: 16 mg/dL (ref 8–27)
CO2: 27 mmol/L (ref 20–29)
Calcium: 9.1 mg/dL (ref 8.6–10.2)
Chloride: 106 mmol/L (ref 96–106)
Creatinine, Ser: 0.89 mg/dL (ref 0.76–1.27)
Glucose: 95 mg/dL (ref 70–99)
Potassium: 4.4 mmol/L (ref 3.5–5.2)
Sodium: 140 mmol/L (ref 134–144)
eGFR: 87 mL/min/{1.73_m2} (ref >59)

## 2022-09-06 ENCOUNTER — Other Ambulatory Visit: Payer: Self-pay | Admitting: Pulmonary Disease

## 2022-09-07 ENCOUNTER — Telehealth: Payer: Self-pay | Admitting: *Deleted

## 2022-09-07 NOTE — Telephone Encounter (Signed)
Right Heart Cath scheduled at [facility] for: Wednesday September 09, 2022 11:30 AM Arrival time and place: [facility] Main Entrance A at: 9:30 AM  Nothing to eat after midnight prior to procedure, clear liquids until 5 AM day of procedure.  Medication instructions: -Hold:  Xarelto-none 09/07/22 until post procedure  Jardiance-AM of procedure  -Except hold medications usual morning medications can be taken with sips of water.  Confirmed patient has responsible adult to drive home post procedure and be with patient first 24 hours after arriving home.  Patient reports no new symptoms concerning for COVID-19 in the past 10 days.  Reviewed procedure instructions with patient.

## 2022-09-09 ENCOUNTER — Other Ambulatory Visit: Payer: Self-pay

## 2022-09-09 ENCOUNTER — Encounter (HOSPITAL_COMMUNITY)
Admission: RE | Disposition: A | Discharge status: Home / Self Care | Payer: Self-pay | Source: Home / Self Care | Attending: Internal Medicine

## 2022-09-09 ENCOUNTER — Ambulatory Visit (HOSPITAL_COMMUNITY)
Admission: RE | Admit: 2022-09-09 | Discharge: 2022-09-09 | Disposition: A | Discharge status: Home / Self Care | Payer: Medicare Other | Attending: Internal Medicine | Admitting: Internal Medicine

## 2022-09-09 DIAGNOSIS — R0609 Other forms of dyspnea: Secondary | ICD-10-CM | POA: Diagnosis not present

## 2022-09-09 DIAGNOSIS — Z79899 Other long term (current) drug therapy: Secondary | ICD-10-CM | POA: Diagnosis not present

## 2022-09-09 DIAGNOSIS — I480 Paroxysmal atrial fibrillation: Secondary | ICD-10-CM | POA: Diagnosis not present

## 2022-09-09 DIAGNOSIS — J449 Chronic obstructive pulmonary disease, unspecified: Secondary | ICD-10-CM | POA: Insufficient documentation

## 2022-09-09 DIAGNOSIS — Z87891 Personal history of nicotine dependence: Secondary | ICD-10-CM | POA: Insufficient documentation

## 2022-09-09 DIAGNOSIS — I495 Sick sinus syndrome: Secondary | ICD-10-CM | POA: Diagnosis not present

## 2022-09-09 DIAGNOSIS — Z950 Presence of cardiac pacemaker: Secondary | ICD-10-CM | POA: Insufficient documentation

## 2022-09-09 DIAGNOSIS — I5032 Chronic diastolic (congestive) heart failure: Secondary | ICD-10-CM | POA: Diagnosis not present

## 2022-09-09 DIAGNOSIS — N183 Chronic kidney disease, stage 3 unspecified: Secondary | ICD-10-CM | POA: Insufficient documentation

## 2022-09-09 DIAGNOSIS — Z7901 Long term (current) use of anticoagulants: Secondary | ICD-10-CM | POA: Diagnosis not present

## 2022-09-09 DIAGNOSIS — I272 Pulmonary hypertension, unspecified: Secondary | ICD-10-CM | POA: Diagnosis not present

## 2022-09-09 DIAGNOSIS — I700 Atherosclerosis of aorta: Secondary | ICD-10-CM | POA: Diagnosis not present

## 2022-09-09 DIAGNOSIS — I130 Hypertensive heart and chronic kidney disease with heart failure and stage 1 through stage 4 chronic kidney disease, or unspecified chronic kidney disease: Secondary | ICD-10-CM | POA: Insufficient documentation

## 2022-09-09 DIAGNOSIS — I2510 Atherosclerotic heart disease of native coronary artery without angina pectoris: Secondary | ICD-10-CM | POA: Insufficient documentation

## 2022-09-09 DIAGNOSIS — E785 Hyperlipidemia, unspecified: Secondary | ICD-10-CM | POA: Diagnosis not present

## 2022-09-09 HISTORY — PX: RIGHT HEART CATH: CATH118263

## 2022-09-09 LAB — POCT I-STAT EG7
Acid-base deficit: 3 mmol/L — ABNORMAL HIGH (ref 0.0–2.0)
Acid-base deficit: 2 mmol/L (ref 0.0–2.0)
Acid-base deficit: 2 mmol/L (ref 0.0–2.0)
Acid-base deficit: 3 mmol/L — ABNORMAL HIGH (ref 0.0–2.0)
Acid-base deficit: 3 mmol/L — ABNORMAL HIGH (ref 0.0–2.0)
Acid-base deficit: 3 mmol/L — ABNORMAL HIGH (ref 0.0–2.0)
Bicarbonate: 23.2 mmol/L (ref 20.0–28.0)
Bicarbonate: 22.6 mmol/L (ref 20.0–28.0)
Bicarbonate: 23.3 mmol/L (ref 20.0–28.0)
Bicarbonate: 23.5 mmol/L (ref 20.0–28.0)
Bicarbonate: 23.6 mmol/L (ref 20.0–28.0)
Bicarbonate: 23.6 mmol/L (ref 20.0–28.0)
Calcium, Ion: 1.26 mmol/L (ref 1.15–1.40)
Calcium, Ion: 1.24 mmol/L (ref 1.15–1.40)
Calcium, Ion: 1.25 mmol/L (ref 1.15–1.40)
Calcium, Ion: 1.25 mmol/L (ref 1.15–1.40)
Calcium, Ion: 1.26 mmol/L (ref 1.15–1.40)
Calcium, Ion: 1.27 mmol/L (ref 1.15–1.40)
HCT: 46 % (ref 39.0–52.0)
HCT: 44 % (ref 39.0–52.0)
HCT: 44 % (ref 39.0–52.0)
HCT: 45 % (ref 39.0–52.0)
HCT: 45 % (ref 39.0–52.0)
HCT: 45 % (ref 39.0–52.0)
Hemoglobin: 15.6 g/dL (ref 13.0–17.0)
Hemoglobin: 15 g/dL (ref 13.0–17.0)
Hemoglobin: 15 g/dL (ref 13.0–17.0)
Hemoglobin: 15.3 g/dL (ref 13.0–17.0)
Hemoglobin: 15.3 g/dL (ref 13.0–17.0)
Hemoglobin: 15.3 g/dL (ref 13.0–17.0)
O2 Saturation: 60 %
O2 Saturation: 63 %
O2 Saturation: 63 %
O2 Saturation: 65 %
O2 Saturation: 66 %
O2 Saturation: 69 %
Potassium: 4.1 mmol/L (ref 3.5–5.1)
Potassium: 4 mmol/L (ref 3.5–5.1)
Potassium: 4 mmol/L (ref 3.5–5.1)
Potassium: 4 mmol/L (ref 3.5–5.1)
Potassium: 4.1 mmol/L (ref 3.5–5.1)
Potassium: 4.1 mmol/L (ref 3.5–5.1)
Sodium: 142 mmol/L (ref 135–145)
Sodium: 141 mmol/L (ref 135–145)
Sodium: 141 mmol/L (ref 135–145)
Sodium: 142 mmol/L (ref 135–145)
Sodium: 142 mmol/L (ref 135–145)
Sodium: 142 mmol/L (ref 135–145)
TCO2: 25 mmol/L (ref 22–32)
TCO2: 24 mmol/L (ref 22–32)
TCO2: 25 mmol/L (ref 22–32)
TCO2: 25 mmol/L (ref 22–32)
TCO2: 25 mmol/L (ref 22–32)
TCO2: 25 mmol/L (ref 22–32)
pCO2, Ven: 43.3 mmHg — ABNORMAL LOW (ref 44–60)
pCO2, Ven: 40.7 mmHg — ABNORMAL LOW (ref 44–60)
pCO2, Ven: 42.8 mmHg — ABNORMAL LOW (ref 44–60)
pCO2, Ven: 43.2 mmHg — ABNORMAL LOW (ref 44–60)
pCO2, Ven: 43.9 mmHg — ABNORMAL LOW (ref 44–60)
pCO2, Ven: 45.5 mmHg (ref 44–60)
pH, Ven: 7.338 (ref 7.25–7.43)
pH, Ven: 7.323 (ref 7.25–7.43)
pH, Ven: 7.339 (ref 7.25–7.43)
pH, Ven: 7.34 (ref 7.25–7.43)
pH, Ven: 7.347 (ref 7.25–7.43)
pH, Ven: 7.353 (ref 7.25–7.43)
pO2, Ven: 33 mmHg (ref 32–45)
pO2, Ven: 35 mmHg (ref 32–45)
pO2, Ven: 36 mmHg (ref 32–45)
pO2, Ven: 36 mmHg (ref 32–45)
pO2, Ven: 36 mmHg (ref 32–45)
pO2, Ven: 38 mmHg (ref 32–45)

## 2022-09-09 SURGERY — RIGHT HEART CATH

## 2022-09-09 MED ORDER — SODIUM CHLORIDE 0.9% FLUSH: 3.0000 mL | Freq: Two times a day (BID) | INTRAVENOUS | Status: DC

## 2022-09-09 MED ORDER — ACETAMINOPHEN 325 MG PO TABS: 650.0000 mg | ORAL_TABLET | ORAL | Status: DC | PRN

## 2022-09-09 MED ORDER — MIDAZOLAM HCL 2 MG/2ML IJ SOLN
INTRAMUSCULAR | Status: DC | PRN
  Administered 2022-09-09: 1 mg via INTRAVENOUS

## 2022-09-09 MED ORDER — FENTANYL CITRATE (PF) 100 MCG/2ML IJ SOLN
INTRAMUSCULAR | Status: DC | PRN
  Administered 2022-09-09: 25 ug via INTRAVENOUS

## 2022-09-09 MED ORDER — SODIUM CHLORIDE 0.9 % IV SOLN
250.0000 mL | INTRAVENOUS | Status: DC | PRN
Start: 2022-09-09 — End: 2022-09-09

## 2022-09-09 MED ORDER — HEPARIN (PORCINE) IN NACL 1000-0.9 UT/500ML-% IV SOLN
INTRAVENOUS | Status: DC | PRN
  Administered 2022-09-09: 500 mL

## 2022-09-09 MED ORDER — HEPARIN (PORCINE) IN NACL 1000-0.9 UT/500ML-% IV SOLN
INTRAVENOUS | Status: AC
  Filled 2022-09-09: qty 500

## 2022-09-09 MED ORDER — ONDANSETRON HCL 4 MG/2ML IJ SOLN: 4.0000 mg | Freq: Four times a day (QID) | INTRAMUSCULAR | Status: DC | PRN

## 2022-09-09 MED ORDER — SODIUM CHLORIDE 0.9% FLUSH: 3.0000 mL | INTRAVENOUS | Status: DC | PRN

## 2022-09-09 MED ORDER — LIDOCAINE HCL (PF) 1 % IJ SOLN
INTRAMUSCULAR | Status: AC
  Filled 2022-09-09: qty 30

## 2022-09-09 MED ORDER — SODIUM CHLORIDE 0.9 % IV SOLN: 250.0000 mL | INTRAVENOUS | Status: DC | PRN

## 2022-09-09 MED ORDER — HYDRALAZINE HCL 20 MG/ML IJ SOLN: 10.0000 mg | INTRAMUSCULAR | Status: DC | PRN

## 2022-09-09 MED ORDER — SODIUM CHLORIDE 0.9 % IV SOLN: INTRAVENOUS | Status: DC

## 2022-09-09 MED ORDER — LABETALOL HCL 5 MG/ML IV SOLN: 10.0000 mg | INTRAVENOUS | Status: DC | PRN

## 2022-09-09 MED ORDER — FENTANYL CITRATE (PF) 100 MCG/2ML IJ SOLN
INTRAMUSCULAR | Status: AC
  Filled 2022-09-09: qty 2

## 2022-09-09 MED ORDER — LIDOCAINE HCL (PF) 1 % IJ SOLN
INTRAMUSCULAR | Status: DC | PRN
  Administered 2022-09-09: 10 mL
  Administered 2022-09-09: 2 mL

## 2022-09-09 MED ORDER — MIDAZOLAM HCL 2 MG/2ML IJ SOLN
INTRAMUSCULAR | Status: AC
  Filled 2022-09-09: qty 2

## 2022-09-09 SURGICAL SUPPLY — 10 items
CATH BALLN WEDGE 5F 110CM (CATHETERS) IMPLANT
PACK CARDIAC CATHETERIZATION (CUSTOM PROCEDURE TRAY) IMPLANT
PROTECTION STATION PRESSURIZED (MISCELLANEOUS) ×1
SHEATH GLIDE SLENDER 4/5FR (SHEATH) IMPLANT
SHEATH PROBE COVER 6X72 (BAG) IMPLANT
STATION PROTECTION PRESSURIZED (MISCELLANEOUS) IMPLANT
TRANSDUCER W/STOPCOCK (MISCELLANEOUS) IMPLANT
TUBING ART PRESS 72  MALE/FEM (TUBING) ×1
TUBING ART PRESS 72 MALE/FEM (TUBING) IMPLANT
WIRE MICRO SET SILHO 5FR 7 (SHEATH) IMPLANT

## 2022-09-09 NOTE — Progress Notes (Signed)
Patient and daughter was given discharge instructions. Both verbalized understanding. 

## 2022-09-09 NOTE — Interval H&P Note (Signed)
History and Physical Interval Note:  09/09/2022 10:29 AM  [name]  has presented today for surgery, with the diagnosis of elevated pa pressures.  The various methods of treatment have been discussed with the patient and family. After consideration of risks, benefits and other options for treatment, the patient has consented to  Procedure(s): RIGHT HEART CATH (N/A) as a surgical intervention.  The patient's history has been reviewed, patient examined, no change in status, stable for surgery.  I have reviewed the patient's chart and labs.  Questions were answered to the patient's satisfaction.     Early Osmond

## 2022-09-09 NOTE — Discharge Instructions (Signed)
Activity Rest as told by your health care provider. Return to your normal activities as told by your health care provider. Ask your health care provider what activities are safe for you. May shower and remove bandage 24 hours after discharge. If you were given a sedative during the procedure, it can affect you for several hours. Do not drive or operate machinery until your health care provider says that it is safe. General instructions      Check your IV insertion area every day for signs of infection. Check for: Redness, swelling, or pain. Fluid or blood. Warmth. Pus or a bad smell. Take over-the-counter and prescription medicines only as told by your health care provider. Contact a health care provider if: You have redness, swelling, warmth, pus, or pain around the IV insertion site. 

## 2022-09-10 ENCOUNTER — Encounter (HOSPITAL_COMMUNITY): Payer: Self-pay | Admitting: Internal Medicine

## 2022-09-10 NOTE — Telephone Encounter (Signed)
Pt stated he has picked up the amlodipine and has start taking it

## 2022-09-16 ENCOUNTER — Other Ambulatory Visit: Payer: Self-pay | Admitting: Internal Medicine

## 2022-09-26 ENCOUNTER — Other Ambulatory Visit: Payer: Self-pay

## 2022-09-26 ENCOUNTER — Inpatient Hospital Stay (HOSPITAL_BASED_OUTPATIENT_CLINIC_OR_DEPARTMENT_OTHER)
Admission: EM | Admit: 2022-09-26 | Discharge: 2022-10-02 | DRG: 038 | Disposition: A | Discharge status: Home / Self Care | Payer: Medicare Other | Attending: Internal Medicine | Admitting: Internal Medicine

## 2022-09-26 ENCOUNTER — Emergency Department (HOSPITAL_BASED_OUTPATIENT_CLINIC_OR_DEPARTMENT_OTHER): Payer: Medicare Other

## 2022-09-26 ENCOUNTER — Encounter (HOSPITAL_BASED_OUTPATIENT_CLINIC_OR_DEPARTMENT_OTHER): Payer: Self-pay

## 2022-09-26 DIAGNOSIS — R3914 Feeling of incomplete bladder emptying: Secondary | ICD-10-CM | POA: Diagnosis not present

## 2022-09-26 DIAGNOSIS — J309 Allergic rhinitis, unspecified: Secondary | ICD-10-CM | POA: Diagnosis present

## 2022-09-26 DIAGNOSIS — R41841 Cognitive communication deficit: Secondary | ICD-10-CM | POA: Diagnosis present

## 2022-09-26 DIAGNOSIS — I110 Hypertensive heart disease with heart failure: Secondary | ICD-10-CM | POA: Diagnosis present

## 2022-09-26 DIAGNOSIS — Z801 Family history of malignant neoplasm of trachea, bronchus and lung: Secondary | ICD-10-CM

## 2022-09-26 DIAGNOSIS — E119 Type 2 diabetes mellitus without complications: Secondary | ICD-10-CM | POA: Diagnosis not present

## 2022-09-26 DIAGNOSIS — Z825 Family history of asthma and other chronic lower respiratory diseases: Secondary | ICD-10-CM | POA: Diagnosis not present

## 2022-09-26 DIAGNOSIS — E785 Hyperlipidemia, unspecified: Secondary | ICD-10-CM | POA: Diagnosis not present

## 2022-09-26 DIAGNOSIS — Z8249 Family history of ischemic heart disease and other diseases of the circulatory system: Secondary | ICD-10-CM

## 2022-09-26 DIAGNOSIS — Z7984 Long term (current) use of oral hypoglycemic drugs: Secondary | ICD-10-CM

## 2022-09-26 DIAGNOSIS — E669 Obesity, unspecified: Secondary | ICD-10-CM | POA: Diagnosis present

## 2022-09-26 DIAGNOSIS — Z79899 Other long term (current) drug therapy: Secondary | ICD-10-CM | POA: Diagnosis not present

## 2022-09-26 DIAGNOSIS — I272 Pulmonary hypertension, unspecified: Secondary | ICD-10-CM | POA: Diagnosis present

## 2022-09-26 DIAGNOSIS — I2510 Atherosclerotic heart disease of native coronary artery without angina pectoris: Secondary | ICD-10-CM | POA: Diagnosis present

## 2022-09-26 DIAGNOSIS — F32A Depression, unspecified: Secondary | ICD-10-CM | POA: Diagnosis present

## 2022-09-26 DIAGNOSIS — J449 Chronic obstructive pulmonary disease, unspecified: Secondary | ICD-10-CM | POA: Diagnosis not present

## 2022-09-26 DIAGNOSIS — Z87891 Personal history of nicotine dependence: Secondary | ICD-10-CM | POA: Diagnosis not present

## 2022-09-26 DIAGNOSIS — G8324 Monoplegia of upper limb affecting left nondominant side: Secondary | ICD-10-CM | POA: Diagnosis present

## 2022-09-26 DIAGNOSIS — I509 Heart failure, unspecified: Secondary | ICD-10-CM | POA: Diagnosis not present

## 2022-09-26 DIAGNOSIS — I639 Cerebral infarction, unspecified: Principal | ICD-10-CM

## 2022-09-26 DIAGNOSIS — N401 Enlarged prostate with lower urinary tract symptoms: Secondary | ICD-10-CM | POA: Diagnosis not present

## 2022-09-26 DIAGNOSIS — E66811 Obesity, class 1: Secondary | ICD-10-CM | POA: Diagnosis present

## 2022-09-26 DIAGNOSIS — Z950 Presence of cardiac pacemaker: Secondary | ICD-10-CM

## 2022-09-26 DIAGNOSIS — Z7901 Long term (current) use of anticoagulants: Secondary | ICD-10-CM

## 2022-09-26 DIAGNOSIS — R531 Weakness: Secondary | ICD-10-CM

## 2022-09-26 DIAGNOSIS — I4891 Unspecified atrial fibrillation: Secondary | ICD-10-CM | POA: Diagnosis present

## 2022-09-26 DIAGNOSIS — I6521 Occlusion and stenosis of right carotid artery: Secondary | ICD-10-CM | POA: Diagnosis not present

## 2022-09-26 DIAGNOSIS — I132 Hypertensive heart and chronic kidney disease with heart failure and with stage 5 chronic kidney disease, or end stage renal disease: Secondary | ICD-10-CM | POA: Diagnosis not present

## 2022-09-26 DIAGNOSIS — I5022 Chronic systolic (congestive) heart failure: Secondary | ICD-10-CM | POA: Diagnosis not present

## 2022-09-26 DIAGNOSIS — I4819 Other persistent atrial fibrillation: Secondary | ICD-10-CM | POA: Diagnosis not present

## 2022-09-26 DIAGNOSIS — K219 Gastro-esophageal reflux disease without esophagitis: Secondary | ICD-10-CM | POA: Diagnosis present

## 2022-09-26 DIAGNOSIS — I495 Sick sinus syndrome: Secondary | ICD-10-CM | POA: Diagnosis not present

## 2022-09-26 DIAGNOSIS — I480 Paroxysmal atrial fibrillation: Secondary | ICD-10-CM | POA: Diagnosis not present

## 2022-09-26 DIAGNOSIS — N400 Enlarged prostate without lower urinary tract symptoms: Secondary | ICD-10-CM | POA: Diagnosis not present

## 2022-09-26 DIAGNOSIS — D696 Thrombocytopenia, unspecified: Secondary | ICD-10-CM | POA: Diagnosis not present

## 2022-09-26 DIAGNOSIS — Z7951 Long term (current) use of inhaled steroids: Secondary | ICD-10-CM

## 2022-09-26 DIAGNOSIS — I5032 Chronic diastolic (congestive) heart failure: Secondary | ICD-10-CM | POA: Insufficient documentation

## 2022-09-26 DIAGNOSIS — I63231 Cerebral infarction due to unspecified occlusion or stenosis of right carotid arteries: Principal | ICD-10-CM

## 2022-09-26 DIAGNOSIS — I700 Atherosclerosis of aorta: Secondary | ICD-10-CM | POA: Diagnosis present

## 2022-09-26 DIAGNOSIS — I10 Essential (primary) hypertension: Secondary | ICD-10-CM | POA: Diagnosis present

## 2022-09-26 DIAGNOSIS — Z6830 Body mass index (BMI) 30.0-30.9, adult: Secondary | ICD-10-CM

## 2022-09-26 DIAGNOSIS — R29701 NIHSS score 1: Secondary | ICD-10-CM | POA: Diagnosis present

## 2022-09-26 LAB — CBC
HCT: 47.6 % (ref 39.0–52.0)
Hemoglobin: 15.4 g/dL (ref 13.0–17.0)
MCH: 30.4 pg (ref 26.0–34.0)
MCHC: 32.4 g/dL (ref 30.0–36.0)
MCV: 94.1 fL (ref 80.0–100.0)
Platelets: 152 10*3/uL (ref 150–400)
RBC: 5.06 MIL/uL (ref 4.22–5.81)
RDW: 13.6 % (ref 11.5–15.5)
WBC: 7.7 10*3/uL (ref 4.0–10.5)
nRBC: 0 % (ref 0.0–0.2)

## 2022-09-26 LAB — DIFFERENTIAL
Abs Immature Granulocytes: 0.02 10*3/uL (ref 0.00–0.07)
Basophils Absolute: 0 10*3/uL (ref 0.0–0.1)
Basophils Relative: 0 %
Eosinophils Absolute: 0 10*3/uL (ref 0.0–0.5)
Eosinophils Relative: 1 %
Immature Granulocytes: 0 %
Lymphocytes Relative: 21 %
Lymphs Abs: 1.6 10*3/uL (ref 0.7–4.0)
Monocytes Absolute: 0.8 10*3/uL (ref 0.1–1.0)
Monocytes Relative: 10 %
Neutro Abs: 5.2 10*3/uL (ref 1.7–7.7)
Neutrophils Relative %: 68 %

## 2022-09-26 LAB — COMPREHENSIVE METABOLIC PANEL
ALT: 26 U/L (ref 0–44)
AST: 20 U/L (ref 15–41)
Albumin: 4.4 g/dL (ref 3.5–5.0)
Alkaline Phosphatase: 67 U/L (ref 38–126)
Anion gap: 10 (ref 5–15)
BUN: 16 mg/dL (ref 8–23)
CO2: 26 mmol/L (ref 22–32)
Calcium: 9.6 mg/dL (ref 8.9–10.3)
Chloride: 106 mmol/L (ref 98–111)
Creatinine, Ser: 0.98 mg/dL (ref 0.61–1.24)
GFR, Estimated: >60 mL/min (ref >60)
Glucose, Bld: 108 mg/dL — ABNORMAL HIGH (ref 70–99)
Potassium: 3.9 mmol/L (ref 3.5–5.1)
Sodium: 142 mmol/L (ref 135–145)
Total Bilirubin: 1 mg/dL (ref 0.3–1.2)
Total Protein: 7.5 g/dL (ref 6.5–8.1)

## 2022-09-26 LAB — APTT: aPTT: 41 seconds — ABNORMAL HIGH (ref 24–36)

## 2022-09-26 LAB — PROTIME-INR
INR: 2.8 — ABNORMAL HIGH (ref 0.8–1.2)
Prothrombin Time: 29 seconds — ABNORMAL HIGH (ref 11.4–15.2)

## 2022-09-26 LAB — CBG MONITORING, ED: Glucose-Capillary: 87 mg/dL (ref 70–99)

## 2022-09-26 LAB — ETHANOL: Alcohol, Ethyl (B): <10 mg/dL (ref <10)

## 2022-09-26 MED ORDER — SODIUM CHLORIDE 0.9% FLUSH: 3.0000 mL | Freq: Once | INTRAVENOUS | Status: DC

## 2022-09-26 NOTE — ED Notes (Signed)
Patient transported to CT 

## 2022-09-26 NOTE — ED Notes (Signed)
PATIENT GIVEN MEAL TRAY AND DRINK.

## 2022-09-26 NOTE — ED Provider Notes (Signed)
[facility] EMERGENCY DEPT Provider Note   CSN: [id number] Arrival date & time: 09/26/22  1023     History  Chief Complaint  Patient presents with   Extremity Weakness   Numbness    [name] is a 81 y.o. male.  HPI     81 year old male comes in with chief complaint of weakness and numbness.  Patient has history of A-fib on Xarelto, CHF, COPD.  The Xarelto was paused on 10-25 for right-sided heart cath.  Patient states that he went to bed feeling fine, woke up with left-sided paralysis.  Since then, some of his strength has improved.  He still unable to hold things with his left upper extremity.  Patient is right-handed dominant.  He also indicates having numbness on the same side.  No vision complaints, slurred speech, difficulty in expressing himself.  Home Medications Prior to Admission medications   Medication Sig Start Date End Date Taking? Authorizing Provider  acetaminophen (TYLENOL) 650 MG CR tablet Take 650 mg by mouth in the morning and at bedtime.    [provider]  amLODipine (NORVASC) 5 MG tablet Take 1 tablet (5 mg total) by mouth daily. 08/28/22 08/28/23  Biagio Borg, MD  atorvastatin (LIPITOR) 80 MG tablet TAKE 1 TABLET BY MOUTH EVERY DAY 01/13/22   Biagio Borg, MD  Budeson-Glycopyrrol-Formoterol (BREZTRI AEROSPHERE) 160-9-4.8 MCG/ACT AERO 1 puffs twice per day 06/17/22   Biagio Borg, MD  Cholecalciferol (VITAMIN D3) 50 MCG (2000 UT) TABS Take 2,000 Units by mouth daily.    [provider]  dorzolamide-timolol (COSOPT) 22.3-6.8 MG/ML ophthalmic solution Place 1 drop into both eyes in the morning and at bedtime. 01/10/20   [provider]  empagliflozin (JARDIANCE) 10 MG TABS tablet Take 1 tablet (10 mg total) by mouth daily before breakfast. 07/29/22   Early Osmond, MD  ezetimibe (ZETIA) 10 MG tablet Take 1 tablet (10 mg total) by mouth daily. 07/29/22   Early Osmond, MD  fexofenadine (ALLEGRA) 180 MG tablet TAKE  1 TABLET (180 MG TOTAL) BY MOUTH DAILY. 12/10/15   Biagio Borg, MD  flecainide (TAMBOCOR) 100 MG tablet TAKE 1 TABLET BY MOUTH TWICE A DAY 12/15/21   Shirley Friar, PA-C  latanoprost (XALATAN) 0.005 % ophthalmic solution Place 1 drop into both eyes at bedtime.     [provider]  meclizine (ANTIVERT) 12.5 MG tablet TAKE 1 TABLET BY MOUTH EVERY 8 HOURS AS NEEDED 09/16/22   Biagio Borg, MD  metoprolol succinate (TOPROL XL) 25 MG 24 hr tablet Take 1 tablet (25 mg total) by mouth at bedtime. 03/27/22   Early Osmond, MD  omeprazole (PRILOSEC) 40 MG capsule TAKE 1 CAPSULE BY MOUTH EVERY DAY AS NEEDED 12/15/21   Biagio Borg, MD  psyllium (METAMUCIL) 0.52 g capsule Take 2 capsules by mouth daily.    [provider]  rivaroxaban (XARELTO) 20 MG TABS tablet TAKE 1 TABLET BY MOUTH DAILY WITH SUPPER. 09/16/21   Biagio Borg, MD  tamsulosin (FLOMAX) 0.4 MG CAPS capsule TAKE 1 CAPSULE BY MOUTH TWICE A DAY 05/30/22   Biagio Borg, MD  telmisartan (MICARDIS) 20 MG tablet Take 1 tablet (20 mg total) by mouth at bedtime. 03/27/22   Early Osmond, MD  traMADol (ULTRAM) 50 MG tablet TAKE 1 TABLET BY MOUTH EVERY 6 HOURS AS NEEDED. FOR PAIN 04/08/22   Biagio Borg, MD      Allergies    Patient  has no known allergies.    Review of Systems   Review of Systems  All other systems reviewed and are negative.   Physical Exam Updated Vital Signs BP 113/83   Pulse 60   Temp 98.3 F (36.8 C) (Oral)   Resp 20   SpO2 98%  Physical Exam Vitals and nursing note reviewed.  Constitutional:      Appearance: He is well-developed.  HENT:     Head: Atraumatic.  Eyes:     Extraocular Movements: Extraocular movements intact.     Pupils: Pupils are equal, round, and reactive to light.  Cardiovascular:     Rate and Rhythm: Normal rate.  Pulmonary:     Effort: Pulmonary effort is normal.  Musculoskeletal:     Cervical back: Neck supple.  Skin:    General: Skin is warm.   Neurological:     Mental Status: He is alert and oriented to person, place, and time.     Sensory: Sensory deficit present.     Motor: Weakness present.     Comments: Left upper extremity strength is 3 out of 5, left upper extremity numbness. Peripheral visual field intact, no aphasia, no neglect. Lower extremity motor and sensory exam normal.     ED Results / Procedures / Treatments   Labs (all labs ordered are listed, but only abnormal results are displayed) Labs Reviewed  PROTIME-INR - Abnormal; Notable for the following components:      Result Value   Prothrombin Time 29.0 (*)    INR 2.8 (*)    All other components within normal limits  APTT - Abnormal; Notable for the following components:   aPTT 41 (*)    All other components within normal limits  COMPREHENSIVE METABOLIC PANEL - Abnormal; Notable for the following components:   Glucose, Bld 108 (*)    All other components within normal limits  CBC  DIFFERENTIAL  ETHANOL  CBG MONITORING, ED    EKG EKG Interpretation  Date/Time:  Saturday September 26 2022 10:51:21 EST Ventricular Rate:  60 PR Interval:  69 QRS Duration: 171 QT Interval:  466 QTC Calculation: 466 R Axis:   -67 Text Interpretation: Sinus rhythm Short PR interval RBBB and LAFB No acute changes No significant change since last tracing Confirmed by Varney Biles [id number]) on 09/26/2022 12:36:42 PM  Radiology CT HEAD WO CONTRAST  Result Date: 09/26/2022 CLINICAL DATA:  Weakness, decreased sensation to left arm EXAM: CT HEAD WITHOUT CONTRAST TECHNIQUE: Contiguous axial images were obtained from the base of the skull through the vertex without intravenous contrast. RADIATION DOSE REDUCTION: This exam was performed according to the departmental dose-optimization program which includes automated exposure control, adjustment of the mA and/or kV according to patient size and/or use of iterative reconstruction technique. COMPARISON:  CT brain, 11/04/2021 CT  cervical spine, 09/06/2019 FINDINGS: Brain: No evidence of acute infarction, hemorrhage, hydrocephalus, extra-axial collection or mass lesion/mass effect. Periventricular and deep white matter hypodensity. Vascular: No hyperdense vessel or unexpected calcification. Skull: Normal. Negative for fracture or focal lesion. Sinuses/Orbits: No acute finding. Unchanged chronic fracture deformity of the medial inferior floor of the left orbit (series 4, image 56). Other: Unchanged, very central appearing position of the dens with respect to the foramen magnum, which appears to deflect the lower brainstem and upper cervical spinal cord (series 5, image 31). IMPRESSION: 1. No acute intracranial pathology. Small-vessel white matter disease. Consider MRI to more sensitively evaluate for acute diffusion restricting infarction if suspected. 2. Unchanged, very central  appearing position of the dens with respect to the foramen magnum, which appears to deflect the lower brainstem and upper cervical spinal cord. This appearance is as on dedicated examination of the cervical spine dated 09/06/2019. Electronically Signed   By: Delanna Ahmadi M.D.   On: 09/26/2022 11:14    Procedures Procedures    Medications Ordered in ED Medications  sodium chloride flush (NS) 0.9 % injection 3 mL (has no administration in time range)    ED Course/ Medical Decision Making/ A&P                           Medical Decision Making Amount and/or Complexity of Data Reviewed Labs: ordered. Radiology: ordered.  Risk Decision regarding hospitalization.   81 year old patient comes in with chief complaint of left-sided weakness.  Patient has history of CHF, A-fib on Xarelto.  Patient indicates that he woke up this morning with left upper extremity paralysis.  Over time the strength has improved.  He still having numbness on the same side as well.  No history of stroke.  On exam patient now has 3 out of 5 left upper extremity strength with  left-sided numbness.  Differential diagnosis considered includes LVO stroke, ischemic stroke, embolic stroke, brain bleed, myelitis.  Patient has no neck pain at all.  CT scan of the brain ordered, interpreted independently.  No evidence of brain bleed.  Patient is negative on LVO screen.  He is not a candidate for TNK given he is on Xarelto and he is well outside of 4.5 hours.  Spoke with Dr. Quinn Axe, neurology.   She recommends admission to hospital.  Holding Xarelto until MRI is completed.  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

## 2022-09-26 NOTE — ED Notes (Signed)
Dr. Kathrynn Humble at bedside evaluating patient

## 2022-09-26 NOTE — ED Triage Notes (Addendum)
Pt states he went to bed around 1130 last night feeling his normal self. He woke up this morning around 0700 with weakness and decreased sensation to his left arm. Pt is AxOx4. He reports initially when he woke up his left arm was flaccid and could not move it. Pt is now able to move his arm, mild drift noted. He reports there is still decreased sensation in left arm in comparison to his right. No other focal neuro deficits noted. Pt does take xarelto for his A-fib, reports he did have a cardiac cath about a week ago and his xarelto was paused for a couple of days.

## 2022-09-26 NOTE — ED Notes (Signed)
He is awake, alert and oriented x 4 with clear speech. He feels as if his left hand is more adept and coordinated than before. He is able to grasp things (such as TV remote) much more easily and deftly than previously.

## 2022-09-26 NOTE — ED Provider Notes (Signed)
Discussed with hospitalist who will admit patient for stroke work-up.  They will hold the Xarelto until the MRI is complete.  Patient also consulted with Dr. Quinn Axe by Dr. Kathrynn Humble.   Fredia Sorrow, MD 09/26/22 [phone]

## 2022-09-27 ENCOUNTER — Inpatient Hospital Stay (HOSPITAL_COMMUNITY): Payer: Medicare Other

## 2022-09-27 DIAGNOSIS — I2510 Atherosclerotic heart disease of native coronary artery without angina pectoris: Secondary | ICD-10-CM | POA: Diagnosis present

## 2022-09-27 DIAGNOSIS — R531 Weakness: Secondary | ICD-10-CM | POA: Diagnosis present

## 2022-09-27 DIAGNOSIS — I4819 Other persistent atrial fibrillation: Secondary | ICD-10-CM | POA: Diagnosis present

## 2022-09-27 DIAGNOSIS — Z79899 Other long term (current) drug therapy: Secondary | ICD-10-CM | POA: Diagnosis not present

## 2022-09-27 DIAGNOSIS — F32A Depression, unspecified: Secondary | ICD-10-CM | POA: Diagnosis present

## 2022-09-27 DIAGNOSIS — I272 Pulmonary hypertension, unspecified: Secondary | ICD-10-CM | POA: Diagnosis present

## 2022-09-27 DIAGNOSIS — K219 Gastro-esophageal reflux disease without esophagitis: Secondary | ICD-10-CM | POA: Diagnosis present

## 2022-09-27 DIAGNOSIS — E785 Hyperlipidemia, unspecified: Secondary | ICD-10-CM | POA: Diagnosis present

## 2022-09-27 DIAGNOSIS — I6521 Occlusion and stenosis of right carotid artery: Secondary | ICD-10-CM | POA: Diagnosis not present

## 2022-09-27 DIAGNOSIS — I5032 Chronic diastolic (congestive) heart failure: Secondary | ICD-10-CM | POA: Diagnosis present

## 2022-09-27 DIAGNOSIS — R3914 Feeling of incomplete bladder emptying: Secondary | ICD-10-CM

## 2022-09-27 DIAGNOSIS — I495 Sick sinus syndrome: Secondary | ICD-10-CM

## 2022-09-27 DIAGNOSIS — Z7901 Long term (current) use of anticoagulants: Secondary | ICD-10-CM | POA: Diagnosis not present

## 2022-09-27 DIAGNOSIS — I132 Hypertensive heart and chronic kidney disease with heart failure and with stage 5 chronic kidney disease, or end stage renal disease: Secondary | ICD-10-CM | POA: Diagnosis not present

## 2022-09-27 DIAGNOSIS — I10 Essential (primary) hypertension: Secondary | ICD-10-CM | POA: Diagnosis not present

## 2022-09-27 DIAGNOSIS — E669 Obesity, unspecified: Secondary | ICD-10-CM

## 2022-09-27 DIAGNOSIS — I480 Paroxysmal atrial fibrillation: Secondary | ICD-10-CM

## 2022-09-27 DIAGNOSIS — I5022 Chronic systolic (congestive) heart failure: Secondary | ICD-10-CM | POA: Diagnosis not present

## 2022-09-27 DIAGNOSIS — Z825 Family history of asthma and other chronic lower respiratory diseases: Secondary | ICD-10-CM | POA: Diagnosis not present

## 2022-09-27 DIAGNOSIS — J449 Chronic obstructive pulmonary disease, unspecified: Secondary | ICD-10-CM | POA: Diagnosis present

## 2022-09-27 DIAGNOSIS — D696 Thrombocytopenia, unspecified: Secondary | ICD-10-CM | POA: Diagnosis present

## 2022-09-27 DIAGNOSIS — N401 Enlarged prostate with lower urinary tract symptoms: Secondary | ICD-10-CM

## 2022-09-27 DIAGNOSIS — R41841 Cognitive communication deficit: Secondary | ICD-10-CM | POA: Diagnosis present

## 2022-09-27 DIAGNOSIS — Z87891 Personal history of nicotine dependence: Secondary | ICD-10-CM | POA: Diagnosis not present

## 2022-09-27 DIAGNOSIS — I639 Cerebral infarction, unspecified: Secondary | ICD-10-CM | POA: Diagnosis not present

## 2022-09-27 DIAGNOSIS — E119 Type 2 diabetes mellitus without complications: Secondary | ICD-10-CM | POA: Diagnosis present

## 2022-09-27 DIAGNOSIS — N400 Enlarged prostate without lower urinary tract symptoms: Secondary | ICD-10-CM | POA: Diagnosis present

## 2022-09-27 DIAGNOSIS — G8324 Monoplegia of upper limb affecting left nondominant side: Secondary | ICD-10-CM | POA: Diagnosis present

## 2022-09-27 DIAGNOSIS — Z950 Presence of cardiac pacemaker: Secondary | ICD-10-CM | POA: Diagnosis not present

## 2022-09-27 DIAGNOSIS — R29701 NIHSS score 1: Secondary | ICD-10-CM | POA: Diagnosis present

## 2022-09-27 DIAGNOSIS — J309 Allergic rhinitis, unspecified: Secondary | ICD-10-CM | POA: Diagnosis present

## 2022-09-27 DIAGNOSIS — I110 Hypertensive heart disease with heart failure: Secondary | ICD-10-CM | POA: Diagnosis present

## 2022-09-27 DIAGNOSIS — I509 Heart failure, unspecified: Secondary | ICD-10-CM | POA: Diagnosis not present

## 2022-09-27 DIAGNOSIS — Z7984 Long term (current) use of oral hypoglycemic drugs: Secondary | ICD-10-CM | POA: Diagnosis not present

## 2022-09-27 DIAGNOSIS — I63231 Cerebral infarction due to unspecified occlusion or stenosis of right carotid arteries: Secondary | ICD-10-CM | POA: Diagnosis present

## 2022-09-27 DIAGNOSIS — I700 Atherosclerosis of aorta: Secondary | ICD-10-CM | POA: Diagnosis present

## 2022-09-27 LAB — CBG MONITORING, ED: Glucose-Capillary: 163 mg/dL — ABNORMAL HIGH (ref 70–99)

## 2022-09-27 MED ORDER — LORATADINE 10 MG PO TABS
10.0000 mg | ORAL_TABLET | Freq: Every day | ORAL | Status: DC
  Administered 2022-09-27 – 2022-10-02 (×5): 10 mg via ORAL
  Filled 2022-09-27 (×5): qty 1

## 2022-09-27 MED ORDER — EMPAGLIFLOZIN 10 MG PO TABS
10.0000 mg | ORAL_TABLET | Freq: Every day | ORAL | Status: DC
  Administered 2022-09-27 – 2022-10-02 (×5): 10 mg via ORAL
  Filled 2022-09-27 (×8): qty 1

## 2022-09-27 MED ORDER — TRAMADOL HCL 50 MG PO TABS
50.0000 mg | ORAL_TABLET | Freq: Four times a day (QID) | ORAL | Status: DC | PRN
  Administered 2022-09-30 – 2022-10-01 (×2): 50 mg via ORAL
  Filled 2022-09-27 (×2): qty 1

## 2022-09-27 MED ORDER — STROKE: EARLY STAGES OF RECOVERY BOOK: Freq: Once | Status: AC

## 2022-09-27 MED ORDER — AMLODIPINE BESYLATE 5 MG PO TABS
5.0000 mg | ORAL_TABLET | Freq: Every day | ORAL | Status: DC
  Filled 2022-09-27: qty 1

## 2022-09-27 MED ORDER — ACETAMINOPHEN 650 MG RE SUPP: 650.0000 mg | RECTAL | Status: DC | PRN

## 2022-09-27 MED ORDER — EZETIMIBE 10 MG PO TABS
10.0000 mg | ORAL_TABLET | Freq: Every day | ORAL | Status: DC
  Administered 2022-09-27 – 2022-10-02 (×5): 10 mg via ORAL
  Filled 2022-09-27 (×6): qty 1

## 2022-09-27 MED ORDER — ACETAMINOPHEN 325 MG PO TABS
650.0000 mg | ORAL_TABLET | ORAL | Status: DC | PRN
  Administered 2022-09-30 – 2022-10-01 (×3): 650 mg via ORAL
  Filled 2022-09-27 (×3): qty 2

## 2022-09-27 MED ORDER — IOHEXOL 350 MG/ML SOLN
75.0000 mL | Freq: Once | INTRAVENOUS | Status: AC | PRN
  Administered 2022-09-27: 75 mL via INTRAVENOUS

## 2022-09-27 MED ORDER — SENNOSIDES-DOCUSATE SODIUM 8.6-50 MG PO TABS: 1.0000 | ORAL_TABLET | Freq: Every evening | ORAL | Status: DC | PRN

## 2022-09-27 MED ORDER — PANTOPRAZOLE SODIUM 40 MG PO TBEC
40.0000 mg | DELAYED_RELEASE_TABLET | Freq: Every day | ORAL | Status: DC
  Administered 2022-09-27 – 2022-10-02 (×5): 40 mg via ORAL
  Filled 2022-09-27 (×5): qty 1

## 2022-09-27 MED ORDER — ACETAMINOPHEN 160 MG/5ML PO SOLN: 650.0000 mg | ORAL | Status: DC | PRN

## 2022-09-27 MED ORDER — IRBESARTAN 75 MG PO TABS
75.0000 mg | ORAL_TABLET | Freq: Every day | ORAL | Status: DC
  Administered 2022-09-27: 75 mg via ORAL
  Filled 2022-09-27: qty 1

## 2022-09-27 MED ORDER — ATORVASTATIN CALCIUM 80 MG PO TABS
80.0000 mg | ORAL_TABLET | Freq: Every day | ORAL | Status: DC
  Administered 2022-09-27 – 2022-10-02 (×5): 80 mg via ORAL
  Filled 2022-09-27 (×4): qty 1
  Filled 2022-09-27: qty 2

## 2022-09-27 MED ORDER — METOPROLOL SUCCINATE ER 25 MG PO TB24: 25.0000 mg | ORAL_TABLET | Freq: Every day | ORAL | Status: DC

## 2022-09-27 MED ORDER — TAMSULOSIN HCL 0.4 MG PO CAPS
0.4000 mg | ORAL_CAPSULE | Freq: Two times a day (BID) | ORAL | Status: DC
  Administered 2022-09-27 – 2022-10-02 (×10): 0.4 mg via ORAL
  Filled 2022-09-27 (×10): qty 1

## 2022-09-27 MED ORDER — FLECAINIDE ACETATE 100 MG PO TABS
100.0000 mg | ORAL_TABLET | Freq: Two times a day (BID) | ORAL | Status: DC
  Administered 2022-09-27 – 2022-10-02 (×10): 100 mg via ORAL
  Filled 2022-09-27: qty 2
  Filled 2022-09-27 (×10): qty 1
  Filled 2022-09-27: qty 2
  Filled 2022-09-27: qty 1
  Filled 2022-09-27: qty 2

## 2022-09-27 NOTE — ED Notes (Signed)
I have contacted [facility] and they plan to courier the Zetia and Jardiance asap.

## 2022-09-27 NOTE — ED Notes (Signed)
He remains awake and alert and has just ambulated to b.r. He tells me he had a "normal" b.m. [facility] is prepping him for transport to [facility]

## 2022-09-27 NOTE — ED Provider Notes (Signed)
The patient has been been boarding for 24 hours, as there are no beds available at [facility], and per our last discussion with [facility] in the past 30 minutes, or bed management, there are no foreseeable available beds.  Therefore we have discussed with the [facility] emergency department in ED to ED transfer in order for the patient to obtain his MRI imaging.  Dr Armandina Gemma ED accepting.    The hospitalist team should be notified upon the patient's arrival, as the patient is admitted to the hospital service.   Wyvonnia Dusky, MD 09/27/22 [phone]

## 2022-09-27 NOTE — ED Notes (Signed)
Called [facility] to set up Ed to ED tx for this patient. Requested by Dr. Langston Masker

## 2022-09-27 NOTE — Consult Note (Signed)
Neurology Consultation  Reason for Consult: left arm weakness and numbness Referring Physician: Dr. Trilby Drummer  CC: Left arm weakness and numbness  History is obtained from: Patient and chart  HPI: [name] is a 81 y.o. male with history of CAD, sick sinus syndrome with pacemaker, hyperlipidemia, hypertension, depression, atrial fibrillation on Xarelto, COPD, GERD, BPH and CHF who presents with left arm weakness and numbness which began Saturday morning.  Patient states that he woke up and felt like his left arm was asleep, but it did not improve over time so he proceeded to the emergency department.  He has recovered some movement and sensation in his left arm, but it still remains weaker than his right with some remaining numbness.  He does have atrial fibrillation and is being treated with Xarelto, however this was held on October 25 for a cardiac catheterization.  Patient will need MRI brain, but this must wait until tomorrow as he has a pacemaker.   LKW: 11/10 PM TNK given?: no, outside of window IR Thrombectomy? No, outside of window Modified Rankin Scale: 0-Completely asymptomatic and back to baseline post- stroke  ROS: A complete ROS was performed and is negative except as noted in the HPI.   Past Medical History:  Diagnosis Date   Abdominal pain, epigastric    Allergic rhinitis, cause unspecified    Arthritis    BPH (benign prostatic hyperplasia)    CHF (congestive heart failure) (HCC)    Chronic anticoagulation    xarelto   Coronary artery calcification seen on CAT scan 12/26/2012   Depressive disorder, not elsewhere classified    Diverticulosis of colon (without mention of hemorrhage)    Esophageal reflux    High risk medication use    Hyperlipidemia    Hyperplastic rectal polyp    Hypertension    Lumbago    Pacemaker    Paralyzed hemidiaphragm    LEFT   Persistent atrial fibrillation (HCC)    Sick sinus syndrome ([place]) 05/16/2009   s/p PPM [employer] investment [employer]) by [name]      Family History  Problem Relation Age of Onset   Diabetes Mother    Hypertension Mother    Hypertension Father    Lung cancer Father    Cancer Father    Asthma Sister    Hypertension Sister    Arthritis Paternal Grandmother    Hypertension Brother    Hypertension Daughter    Hypertension Son      Social History:   reports that he quit smoking about 30 years ago. His smoking use included cigarettes. He has a 10.00 pack-year smoking history. He has quit using smokeless tobacco.  His smokeless tobacco use included chew. He reports that he does not drink alcohol and does not use drugs.  Medications  Current Facility-Administered Medications:    [START ON 09/28/2022]  stroke: early stages of recovery book, , Does not apply, Once, Marcelyn Bruins, MD   acetaminophen (TYLENOL) tablet 650 mg, 650 mg, Oral, Q4H PRN **OR** acetaminophen (TYLENOL) 160 MG/5ML solution 650 mg, 650 mg, Per Tube, Q4H PRN **OR** acetaminophen (TYLENOL) suppository 650 mg, 650 mg, Rectal, Q4H PRN, Marcelyn Bruins, MD   atorvastatin (LIPITOR) tablet 80 mg, 80 mg, Oral, Daily, Marcelyn Bruins, MD, 80 mg at 09/27/22 1054   empagliflozin (JARDIANCE) tablet 10 mg, 10 mg, Oral, QAC breakfast, Marcelyn Bruins, MD, 10 mg at 09/27/22 1055   ezetimibe (ZETIA) tablet 10 mg, 10 mg, Oral, Daily, Marcelyn Bruins, MD,  10 mg at 09/27/22 1054   flecainide (TAMBOCOR) tablet 100 mg, 100 mg, Oral, BID, Marcelyn Bruins, MD, 100 mg at 09/27/22 1055   loratadine (CLARITIN) tablet 10 mg, 10 mg, Oral, Daily, Marcelyn Bruins, MD, 10 mg at 09/27/22 1055   pantoprazole (PROTONIX) EC tablet 40 mg, 40 mg, Oral, Daily, Marcelyn Bruins, MD, 40 mg at 09/27/22 1055   senna-docusate (Senokot-S) tablet 1 tablet, 1 tablet, Oral, QHS PRN, Marcelyn Bruins, MD   sodium chloride flush (NS) 0.9 % injection 3 mL, 3 mL, Intravenous, Once, Marcelyn Bruins, MD   tamsulosin [facility]) capsule 0.4 mg, 0.4 mg, Oral, BID,  Marcelyn Bruins, MD, 0.4 mg at 09/27/22 1054   traMADol (ULTRAM) tablet 50 mg, 50 mg, Oral, Q6H PRN, Marcelyn Bruins, MD  Current Outpatient Medications:    acetaminophen (TYLENOL) 650 MG CR tablet, Take 1,300 mg by mouth in the morning and at bedtime., Disp: , Rfl:    amLODipine (NORVASC) 5 MG tablet, Take 1 tablet (5 mg total) by mouth daily., Disp: 90 tablet, Rfl: 3   atorvastatin (LIPITOR) 80 MG tablet, TAKE 1 TABLET BY MOUTH EVERY DAY (Patient taking differently: Take 80 mg by mouth daily.), Disp: 90 tablet, Rfl: 3   Budeson-Glycopyrrol-Formoterol (BREZTRI AEROSPHERE) 160-9-4.8 MCG/ACT AERO, 1 puffs twice per day, Disp: 10.7 g, Rfl: 11   Cholecalciferol (VITAMIN D3) 50 MCG (2000 UT) TABS, Take 2,000 Units by mouth daily., Disp: , Rfl:    dorzolamide-timolol (COSOPT) 22.3-6.8 MG/ML ophthalmic solution, Place 1 drop into both eyes in the morning and at bedtime., Disp: , Rfl:    ezetimibe (ZETIA) 10 MG tablet, Take 1 tablet (10 mg total) by mouth daily., Disp: 90 tablet, Rfl: 3   fexofenadine (ALLEGRA) 180 MG tablet, TAKE 1 TABLET (180 MG TOTAL) BY MOUTH DAILY. (Patient taking differently: Take 180 mg by mouth daily.), Disp: 90 tablet, Rfl: 1   flecainide (TAMBOCOR) 100 MG tablet, TAKE 1 TABLET BY MOUTH TWICE A DAY (Patient taking differently: Take 100 mg by mouth 2 (two) times daily.), Disp: 180 tablet, Rfl: 3   latanoprost (XALATAN) 0.005 % ophthalmic solution, Place 1 drop into both eyes at bedtime. , Disp: , Rfl:    meclizine (ANTIVERT) 12.5 MG tablet, TAKE 1 TABLET BY MOUTH EVERY 8 HOURS AS NEEDED (Patient taking differently: Take 12.5 mg by mouth 3 (three) times daily as needed for dizziness.), Disp: 30 tablet, Rfl: 5   omeprazole (PRILOSEC) 40 MG capsule, TAKE 1 CAPSULE BY MOUTH EVERY DAY AS NEEDED (Patient taking differently: Take 40 mg by mouth daily. TAKE 1 CAPSULE BY MOUTH EVERY DAY AS NEEDED), Disp: 90 capsule, Rfl: 2   psyllium (METAMUCIL) 0.52 g capsule, Take 2 capsules by  mouth daily., Disp: , Rfl:    rivaroxaban (XARELTO) 20 MG TABS tablet, TAKE 1 TABLET BY MOUTH DAILY WITH SUPPER. (Patient taking differently: Take 20 mg by mouth daily with supper.), Disp: 90 tablet, Rfl: 3   tamsulosin (FLOMAX) 0.4 MG CAPS capsule, TAKE 1 CAPSULE BY MOUTH TWICE A DAY (Patient taking differently: Take 0.4 mg by mouth 2 (two) times daily.), Disp: 180 capsule, Rfl: 3   telmisartan (MICARDIS) 20 MG tablet, Take 1 tablet (20 mg total) by mouth at bedtime., Disp: 90 tablet, Rfl: 2   traMADol (ULTRAM) 50 MG tablet, TAKE 1 TABLET BY MOUTH EVERY 6 HOURS AS NEEDED. FOR PAIN (Patient taking differently: Take 50 mg by mouth every 6 (six) hours as needed for moderate pain.), Disp:  120 tablet, Rfl: 2   empagliflozin (JARDIANCE) 10 MG TABS tablet, Take 1 tablet (10 mg total) by mouth daily before breakfast. (Patient not taking: Reported on 09/27/2022), Disp: 90 tablet, Rfl: 3   metoprolol succinate (TOPROL XL) 25 MG 24 hr tablet, Take 1 tablet (25 mg total) by mouth at bedtime. (Patient not taking: Reported on 09/27/2022), Disp: 90 tablet, Rfl: 2   Exam: Current vital signs: BP 112/89   Pulse 62   Temp 98.7 F (37.1 C) (Oral)   Resp (!) 24   SpO2 100%  Vital signs in last 24 hours: Temp:  [98 F (36.7 C)-98.7 F (37.1 C)] 98.7 F (37.1 C) (11/12 1238) Pulse Rate:  [37-66] 62 (11/12 1400) Resp:  [14-29] 24 (11/12 1400) BP: (112-140)/(71-106) 112/89 (11/12 1400) SpO2:  [90 %-100 %] 100 % (11/12 1400)  GENERAL: Awake, alert, in no acute distress Psych: Affect appropriate for situation, patient is calm and cooperative with examination Head: Normocephalic and atraumatic, without obvious abnormality EENT: Normal conjunctivae, moist mucous membranes, no OP obstruction LUNGS: Normal respiratory effort. Non-labored breathing on room air CV: Regular rate and rhythm on telemetry Extremities: warm, well perfused, without obvious deformity  NEURO:  Mental Status: Awake, alert, and oriented  to person, place, time, and situation. He/She is able to provide a clear and coherent history of present illness. Speech/Language: speech is clear and fluent.   Naming, repetition, fluency, and comprehension intact without aphasia  No neglect is noted Cranial Nerves:  II: PERRL visual fields full.  III, IV, VI: EOMI. Lid elevation symmetric and full.  V: Sensation is intact to light touch and symmetrical to face.  VII: Face is symmetric resting and smiling.  VIII: Hearing intact to voice IX, X: Palate elevation is symmetric. Phonation normal.  XI: Normal sternocleidomastoid and trapezius muscle strength XII: Tongue protrudes midline without fasciculations.   Motor: 5/5 strength to right upper extremity and bilateral lower extremities, 4+/5 strength to left upper extremity, most marked weakness at the deltoid, stronger distally.  Tone is normal. Bulk is normal.  Sensation: Intact to light touch bilaterally in all four extremities but diminished in left upper extremity. No extinction to DSS present.   DTRs: Unable to elicit Gait: Deferred  NIHSS: 1a Level of Conscious.: 0 1b LOC Questions: 0 1c LOC Commands: 0 2 Best Gaze: 0 3 Visual: 0 4 Facial Palsy: 0 5a Motor Arm - left: 0 5b Motor Arm - Right: 0 6a Motor Leg - Left: 0 6b Motor Leg - Right: 0 7 Limb Ataxia: 0 8 Sensory: 1 9 Best Language: 0 10 Dysarthria: 0 11 Extinct. and Inatten.: 0 TOTAL: 1   Labs I have reviewed labs in epic and the results pertinent to this consultation are:   CBC    Component Value Date/Time   WBC 7.7 09/26/2022 1119   RBC 5.06 09/26/2022 1119   HGB 15.4 09/26/2022 1119   HGB 14.9 09/03/2022 1229   HCT 47.6 09/26/2022 1119   HCT 43.9 09/03/2022 1229   PLT 152 09/26/2022 1119   PLT 159 09/03/2022 1229   MCV 94.1 09/26/2022 1119   MCV 91 09/03/2022 1229   MCH 30.4 09/26/2022 1119   MCHC 32.4 09/26/2022 1119   RDW 13.6 09/26/2022 1119   RDW 13.9 09/03/2022 1229   LYMPHSABS 1.6  09/26/2022 1119   LYMPHSABS 2.1 09/18/2020 1549   MONOABS 0.8 09/26/2022 1119   EOSABS 0.0 09/26/2022 1119   EOSABS 0.1 09/18/2020 1549   BASOSABS 0.0 09/26/2022  1119   BASOSABS 0.1 09/18/2020 1549    CMP     Component Value Date/Time   NA 142 09/26/2022 1119   NA 140 09/03/2022 1229   K 3.9 09/26/2022 1119   CL 106 09/26/2022 1119   CO2 26 09/26/2022 1119   GLUCOSE 108 (H) 09/26/2022 1119   GLUCOSE 86 10/25/2006 0935   BUN 16 09/26/2022 1119   BUN 16 09/03/2022 1229   CREATININE 0.98 09/26/2022 1119   CREATININE 1.03 12/11/2015 0912   CALCIUM 9.6 09/26/2022 1119   PROT 7.5 09/26/2022 1119   PROT 7.0 08/12/2022 1558   ALBUMIN 4.4 09/26/2022 1119   ALBUMIN 4.5 08/12/2022 1558   AST 20 09/26/2022 1119   ALT 26 09/26/2022 1119   ALKPHOS 67 09/26/2022 1119   BILITOT 1.0 09/26/2022 1119   BILITOT 0.7 08/12/2022 1558   GFRNONAA >60 09/26/2022 1119   GFRAA 89 09/18/2020 1549    Lipid Panel     Component Value Date/Time   CHOL 127 08/12/2022 1558   TRIG 84 08/12/2022 1558   TRIG 31 10/25/2006 0935   HDL 59 08/12/2022 1558   CHOLHDL 2.2 08/12/2022 1558   CHOLHDL 3 06/01/2022 1004   VLDL 14.4 06/01/2022 1004   LDLCALC 52 08/12/2022 1558   LDLDIRECT 183.4 12/26/2013 0911     Imaging I have reviewed the images obtained:  CT-scan of the brain: No acute abnormality  CTA Head:  1. Hypoattenuation and loss of gray differentiation in the high right posterior frontal lobe, suspicious for acute infarct. Recommend MRI to further assess if the patient is able. 2. No acute hemorrhage. Vessels: 1. No emergent large vessel occlusion. 2. 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 3. Chronic widening of the atlantodental interval (up to 6 mm), suggestive of instability/laxity.  MRI examination of the brain: Pending  Assessment: 81 year old patient with history of CAD, sick sinus syndrome with pacemaker, hypertension, hyperlipidemia, depression, atrial  fibrillation on Xarelto, COPD, BPH, GERD and CHF presented with left arm weakness and numbness that started Saturday morning.  Patient reports that his left arm weakness and numbness have improved somewhat, but some objective weakness does remain and sensation is still diminished.  Patient will need stroke work-up with brain MRI.  Brain MRI will happen tomorrow as patient has a pacemaker.  Patient did pause his Xarelto on October 25 for a cardiac catheterization, which was reportedly normal, but more recently has been taking it as scheduled  Impression: Acute ischemic stroke in patient with atrial fibrillation  Recommendations: Stroke/TIA Workup - Admit for stroke workup - Permissive HTN x48 hrs from sx onset, goal BP <220/110. PRN labetalol or hydralazine if BP above these parameters. Avoid oral antihypertensives.  -  From 11/13 at  7 AM goal normotension (which he has been meeting on his own) - MRI brain wo contrast to confirm acute stroke as noted on head CT and confirm full extent of stroke to guide resumption of anticoagulation  - TTE w/ bubble not indicated, already on Xarelto - Check A1c and LDL + add statin per guidelines - Fully anticoagulated with Xarelto, which has been on hold pending MRI. If not felt to be safe to restart after MRI, start ASA 325 mg daily until [facility] is resumed  - q4 hr neuro checks - STAT head CT for any change in neuro exam - Tele - PT/OT/SLP - Stroke education - Amb referral to neurology upon discharge    Pt seen by NP/Neuro and later  by MD. August Luz to be edited by MD as needed.  [place] , MSN, AGACNP-BC [facility] See Amion for schedule and pager information 09/27/2022 2:32 PM   Lesleigh Noe MD-PhD [facility] [phone] Available 7 PM to 7 AM, outside of these hours please call Neurologist on call as listed on Amion.

## 2022-09-27 NOTE — H&P (Signed)
History and Physical   [name] DDU:[id number] DOB: 31-Jan-1941 DOA: 09/26/2022  PCP: Biagio Borg, MD   Patient coming from: Home  Chief Complaint: Weakness and numbness  HPI: [name] is a 81 y.o. male with medical history significant of CAD, sick sinus syndrome status post pacemaker, hyperlipidemia, depression, hypertension, atrial fibrillation, COPD, obesity, GERD, BPH, alcohol use, CHF presenting with left sided weakness and numbness.  Patient states that he went to bed the night prior to presentation and woke up the morning of presentation with left-sided weakness/paralysis.  Since that time he has had some return in his strength on the left side but still unable to hold things in his left hand.  He also continues to have left-sided numbness.  He denies fevers, chills, chest pain, shortness of breath, abdominal pain, constipation, diarrhea, nausea, vomiting.  He is on Xarelto for atrial fibrillation and this was held on October 25 for right heart cath.  ED Course: Vital signs in the ED significant for heart rate in the 50s to 60s, blood pressure in the 237S to 283 systolic.  Lab work-up included CMP with glucose 1 week.  CBC within normal limits.  PT 29, INR 2.8, PTT 41.  Ethanol level negative.  CT head showed no acute abnormalities.  Did show stable chronic changes including slightly abnormal location of the dens abutting structures.  Medications ordered in the ED.  Neurology consulted and recommended admission and holding of Xarelto until MRI can be done.  Patient ultimately transferred ED to ED after being accepted by hospitalist service due to greater than 24-hour wait in med center.  Neurology will be reconsulted due to arrival.  Review of Systems: As per HPI otherwise all other systems reviewed and are negative.  Past Medical History:  Diagnosis Date   Abdominal pain, epigastric    Allergic rhinitis, cause unspecified    Arthritis    BPH (benign prostatic hyperplasia)     CHF (congestive heart failure) (HCC)    Chronic anticoagulation    xarelto   Coronary artery calcification seen on CAT scan 12/26/2012   Depressive disorder, not elsewhere classified    Diverticulosis of colon (without mention of hemorrhage)    Esophageal reflux    High risk medication use    Hyperlipidemia    Hyperplastic rectal polyp    Hypertension    Lumbago    Pacemaker    Paralyzed hemidiaphragm    LEFT   Persistent atrial fibrillation (HCC)    Sick sinus syndrome ([place]) 05/16/2009   s/p PPM ([employer]) by [name]    Past Surgical History:  Procedure Laterality Date   CYSTOSCOPY WITH RETROGRADE PYELOGRAM, URETEROSCOPY AND STENT PLACEMENT Left 12/30/2021   Procedure: CYSTOSCOPY WITH RETROGRADE PYELOGRAM, URETEROSCOPY;  Surgeon: Remi Haggard, MD;  Location: [facility];  Service: Urology;  Laterality: Left;   ESOPHAGOGASTRODUODENOSCOPY N/A 12/05/2013   Procedure: ESOPHAGOGASTRODUODENOSCOPY (EGD);  Surgeon: [name], MD;  Location: [facility];  Service: Endoscopy;  Laterality: N/A;   ESOPHAGOGASTRODUODENOSCOPY N/A 07/04/2015   Procedure: ESOPHAGOGASTRODUODENOSCOPY (EGD);  Surgeon: [name], MD;  Location: [place];  Service: Endoscopy;  Laterality: N/A;   EXTRACORPOREAL SHOCK WAVE LITHOTRIPSY Left 10/13/2021   Procedure: EXTRACORPOREAL SHOCK WAVE LITHOTRIPSY (ESWL);  Surgeon: Lucas Mallow, MD;  Location: [facility];  Service: Urology;  Laterality: Left;  90 MINS   INSERT / REPLACE / REMOVE PACEMAKER  05/2009    [employer] ALTRUA 60  (serial number  [id number]) dual-chamber pacemaker   LEAD REVISION/REPAIR N/A 10/15/2020   Procedure: LEAD REVISION/REPAIR;  Surgeon: Thompson Grayer, MD;  Location: [place] [facility];  Service: Cardiovascular;  Laterality: N/A;   PACEMAKER PLACEMENT Bilateral    '05   PPM GENERATOR CHANGEOUT N/A 10/15/2020   Procedure: PPM GENERATOR CHANGEOUT;  Surgeon: Thompson Grayer, MD;  Location: [place] [facility];  Service: Cardiovascular;  Laterality: N/A;   RIGHT HEART CATH N/A 09/09/2022   Procedure: RIGHT HEART CATH;  Surgeon: Early Osmond, MD;  Location: [place] [facility];  Service: Cardiovascular;  Laterality: N/A;    Social History  reports that he quit smoking about 30 years ago. His smoking use included cigarettes. He has a 10.00 pack-year smoking history. He has quit using smokeless tobacco.  His smokeless tobacco use included chew. He reports that he does not drink alcohol and does not use drugs.  No Known Allergies  Family History  Problem Relation Age of Onset   Diabetes Mother    Hypertension Mother    Hypertension Father    Lung cancer Father    Cancer Father    Asthma Sister    Hypertension Sister    Arthritis Paternal Grandmother    Hypertension Brother    Hypertension Daughter    Hypertension Son   Reviewed on admission  Prior to Admission medications   Medication Sig Start Date End Date Taking? Authorizing Provider  acetaminophen (TYLENOL) 650 MG CR tablet Take 650 mg by mouth in the morning and at bedtime.    [provider]  amLODipine (NORVASC) 5 MG tablet Take 1 tablet (5 mg total) by mouth daily. 08/28/22 08/28/23  Biagio Borg, MD  atorvastatin (LIPITOR) 80 MG tablet TAKE 1 TABLET BY MOUTH EVERY DAY 01/13/22   Biagio Borg, MD  Budeson-Glycopyrrol-Formoterol (BREZTRI AEROSPHERE) 160-9-4.8 MCG/ACT AERO 1 puffs twice per day 06/17/22   Biagio Borg, MD  Cholecalciferol (VITAMIN D3) 50 MCG (2000 UT) TABS Take 2,000 Units by mouth daily.    [provider]  dorzolamide-timolol (COSOPT) 22.3-6.8 MG/ML ophthalmic solution Place 1 drop into both eyes in the morning and at bedtime. 01/10/20   [provider]  empagliflozin (JARDIANCE) 10 MG TABS tablet Take 1 tablet (10 mg total) by mouth daily before breakfast. 07/29/22   Early Osmond, MD  ezetimibe (ZETIA) 10 MG tablet Take 1 tablet (10 mg total) by mouth daily. 07/29/22    Early Osmond, MD  fexofenadine (ALLEGRA) 180 MG tablet TAKE 1 TABLET (180 MG TOTAL) BY MOUTH DAILY. 12/10/15   Biagio Borg, MD  flecainide (TAMBOCOR) 100 MG tablet TAKE 1 TABLET BY MOUTH TWICE A DAY 12/15/21   Shirley Friar, PA-C  latanoprost (XALATAN) 0.005 % ophthalmic solution Place 1 drop into both eyes at bedtime.     [provider]  meclizine (ANTIVERT) 12.5 MG tablet TAKE 1 TABLET BY MOUTH EVERY 8 HOURS AS NEEDED 09/16/22   Biagio Borg, MD  metoprolol succinate (TOPROL XL) 25 MG 24 hr tablet Take 1 tablet (25 mg total) by mouth at bedtime. 03/27/22   Early Osmond, MD  omeprazole (PRILOSEC) 40 MG capsule TAKE 1 CAPSULE BY MOUTH EVERY DAY AS NEEDED 12/15/21   Biagio Borg, MD  psyllium (METAMUCIL) 0.52 g capsule Take 2 capsules by mouth daily.    [provider]  rivaroxaban (XARELTO) 20 MG TABS tablet TAKE 1 TABLET BY MOUTH DAILY WITH SUPPER. 09/16/21   Biagio Borg,  MD  tamsulosin (FLOMAX) 0.4 MG CAPS capsule TAKE 1 CAPSULE BY MOUTH TWICE A DAY 05/30/22   Biagio Borg, MD  telmisartan (MICARDIS) 20 MG tablet Take 1 tablet (20 mg total) by mouth at bedtime. 03/27/22   Early Osmond, MD  traMADol (ULTRAM) 50 MG tablet TAKE 1 TABLET BY MOUTH EVERY 6 HOURS AS NEEDED. FOR PAIN 04/08/22   Biagio Borg, MD    Physical Exam: Vitals:   09/27/22 0600 09/27/22 0900 09/27/22 1153 09/27/22 1238  BP: (!) 140/93 130/87 133/86 123/88  Pulse: (!) 59 60 60 62  Resp: 18 17 (!) 21 (!) 22  Temp:  98 F (36.7 C) 98.2 F (36.8 C) 98.7 F (37.1 C)  TempSrc:  Oral Oral Oral  SpO2: 97% 99% 98% 100%    Physical Exam Constitutional:      General: He is not in acute distress.    Appearance: Normal appearance.  HENT:     Head: Normocephalic and atraumatic.     Mouth/Throat:     Mouth: Mucous membranes are moist.     Pharynx: Oropharynx is clear.  Eyes:     Extraocular Movements: Extraocular movements intact.     Pupils: Pupils are equal, round, and reactive to  light.  Cardiovascular:     Rate and Rhythm: Normal rate and regular rhythm.     Pulses: Normal pulses.     Heart sounds: Normal heart sounds.  Pulmonary:     Effort: Pulmonary effort is normal. No respiratory distress.     Breath sounds: Normal breath sounds.  Abdominal:     General: Bowel sounds are normal. There is no distension.     Palpations: Abdomen is soft.     Tenderness: There is no abdominal tenderness.  Musculoskeletal:        General: No swelling or deformity.  Skin:    General: Skin is warm and dry.  Neurological:     Comments: Mental Status: Patient is awake, alert, oriented x3 No signs of aphasia or neglect Cranial Nerves: II: Pupils equal, round, and reactive to light.   III,IV, VI: EOMI without ptosis or diploplia.  V: Facial sensation is symmetric to light touch. VII: Facial movement is symmetric.  VIII: hearing is intact to voice X: Uvula elevates symmetrically XI: Decreased shoulder shrug on left XII: tongue is midline without atrophy or fasciculations.  Motor: Good effort thorughout, at Least 3/5 LUE, 5/5R UE, 5/5 bilateral lower extremitiy  Sensory: Numbness at LUE, sensation is grossly intact otherwise Cerebellar: Finger-Nose intact bilalat    Labs on Admission: I have personally reviewed following labs and imaging studies  CBC: Recent Labs  Lab 09/26/22 1119  WBC 7.7  NEUTROABS 5.2  HGB 15.4  HCT 47.6  MCV 94.1  PLT 431    Basic Metabolic Panel: Recent Labs  Lab 09/26/22 1119  NA 142  K 3.9  CL 106  CO2 26  GLUCOSE 108*  BUN 16  CREATININE 0.98  CALCIUM 9.6    GFR: CrCl cannot be calculated (Unknown ideal weight.).  Liver Function Tests: Recent Labs  Lab 09/26/22 1119  AST 20  ALT 26  ALKPHOS 67  BILITOT 1.0  PROT 7.5  ALBUMIN 4.4    Urine analysis:    Component Value Date/Time   COLORURINE YELLOW 06/01/2022 1004   APPEARANCEUR CLEAR 06/01/2022 1004   LABSPEC 1.010 06/01/2022 1004   PHURINE 6.5 06/01/2022 1004    GLUCOSEU NEGATIVE 06/01/2022 1004   HGBUR NEGATIVE 06/01/2022 1004  BILIRUBINUR NEGATIVE 06/01/2022 1004   KETONESUR NEGATIVE 06/01/2022 1004   PROTEINUR NEGATIVE 05/19/2022 0725   UROBILINOGEN 0.2 06/01/2022 1004   NITRITE NEGATIVE 06/01/2022 1004   LEUKOCYTESUR NEGATIVE 06/01/2022 1004    Radiological Exams on Admission: CT HEAD WO CONTRAST  Result Date: 09/26/2022 CLINICAL DATA:  Weakness, decreased sensation to left arm EXAM: CT HEAD WITHOUT CONTRAST TECHNIQUE: Contiguous axial images were obtained from the base of the skull through the vertex without intravenous contrast. RADIATION DOSE REDUCTION: This exam was performed according to the departmental dose-optimization program which includes automated exposure control, adjustment of the mA and/or kV according to patient size and/or use of iterative reconstruction technique. COMPARISON:  CT brain, 11/04/2021 CT cervical spine, 09/06/2019 FINDINGS: Brain: No evidence of acute infarction, hemorrhage, hydrocephalus, extra-axial collection or mass lesion/mass effect. Periventricular and deep white matter hypodensity. Vascular: No hyperdense vessel or unexpected calcification. Skull: Normal. Negative for fracture or focal lesion. Sinuses/Orbits: No acute finding. Unchanged chronic fracture deformity of the medial inferior floor of the left orbit (series 4, image 56). Other: Unchanged, very central appearing position of the dens with respect to the foramen magnum, which appears to deflect the lower brainstem and upper cervical spinal cord (series 5, image 31). IMPRESSION: 1. No acute intracranial pathology. Small-vessel white matter disease. Consider MRI to more sensitively evaluate for acute diffusion restricting infarction if suspected. 2. Unchanged, very central appearing position of the dens with respect to the foramen magnum, which appears to deflect the lower brainstem and upper cervical spinal cord. This appearance is as on dedicated  examination of the cervical spine dated 09/06/2019. Electronically Signed   By: Delanna Ahmadi M.D.   On: 09/26/2022 11:14    EKG: Independently reviewed.  Paced rhythm at 60 bpm.  Right bundle branch block.  Similar to previous.  Assessment/Plan Principal Problem:   Left-sided weakness Active Problems:   Coronary artery calcification seen on CAT scan   Sick sinus syndrome (HCC)   Hyperlipidemia   Depression   Essential hypertension   Atrial fibrillation (HCC)   GERD   BPH (benign prostatic hyperplasia)   COPD GOLD III with marked reversibility    CHF (congestive heart failure) (HCC)   Obesity, Class I, BMI 30-34.9   Left-sided weakness Stroke suspected > Presenting with acute onset of left upper extremity weakness and numbness when he awoke yesterday morning.  Normal when he went to bed.  Has had some return of strength but still has weakness and numbness. > CT head without acute abnormalities but did show some stable chronic changes.  MRI ordered but patient has history of permanent pacemaker in 2010 that was exchanged with 21 however there is a residual abandon lead from the 2010 pacemaker still in place. > Neurology consulted in the ED and recommending admission and work-up, reconsulted on arrival. - Appreciate neurology recommendations - Allow for permissive HTN (systolic < 725 and diastolic < 366)  - ASA   - Continue home atorvastatin and Zetia - Echocardiogram  - CTA head & neck  - A1C  - Lipid panel  - Tele monitoring  - SLP eval - PT/OT  Atrial fibrillation Sick sinus syndrome > Status post pacemaker.  Implanted in 2010, exchanging 2021 with a band in original lead in place. - Paced rhythm in ED - Continue to monitor - Continue home flecainide - Holding home Xarelto while awaiting MRI - Holding home metoprolol in setting of permissive hypertension  Hypertension > States he noticed some lower blood pressures recently  and has been taking only amlodipine at  home. - Holding home amlodipine, irbesartan, metoprolol in the setting of permissive hypertension  Hyperlipidemia CAD > CAD by imaging. - Continue home atorvastatin and Zetia   GERD - Continue home PPI  BPH - Continue home tamsulosin  CHF > Last echo earlier this year with EF 55%, G1 DD, mildly reduced RV function. - Continue home Jardiance - Holding home irbesartan, metoprolol in the setting of permissive hypertension as above  COPD > Diagnoses listed but not on any inhalers  Obesity - Noted  DVT prophylaxis: Holding Xarelto for now, resume after MRI Code Status:   Full Family Communication:  None on admission.  States his family is aware of admission and up-to-date he was able to reach out to them after arrival to [facility]  Disposition Plan:   Patient is from:  Home  Anticipated DC to:  Home  Anticipated DC date:  2 to 3 days  Anticipated DC barriers: None  Consults called:  Neurology Admission status:  Inpatient, telemetry   Severity of Illness: The appropriate patient status for this patient is INPATIENT. Inpatient status is judged to be reasonable and necessary in order to provide the required intensity of service to ensure the patient's safety. The patient's presenting symptoms, physical exam findings, and initial radiographic and laboratory data in the context of their chronic comorbidities is felt to place them at high risk for further clinical deterioration. Furthermore, it is not anticipated that the patient will be medically stable for discharge from the hospital within 2 midnights of admission.   * I certify that at the point of admission it is my clinical judgment that the patient will require inpatient hospital care spanning beyond 2 midnights from the point of admission due to high intensity of service, high risk for further deterioration and high frequency of surveillance required.Marcelyn Bruins MD [facility]  How to contact the [facility]  Attending or Consulting provider [place] or covering provider during after hours [place], for this patient?   Check the care team in [facility] and look for a) attending/consulting [facility] provider listed and b) the [facility] team listed Log into [email or web address] and use [place]'s universal password to access. If you do not have the password, please contact the hospital operator. Locate the [facility] provider you are looking for under [facility] and page to a number that you can be directly reached. If you still have difficulty reaching the provider, please page the [facility] (Director on Call) for the Hospitalists listed on amion for assistance.  09/27/2022, 1:50 PM

## 2022-09-27 NOTE — ED Notes (Signed)
He capably stands without assitance and I obtain a recliner for him per his request. He is alert and oriented x 4 with clear speech. He tells me, besides some minor "aching all over from being in bed" that he is pain-free. I also get him a drink at this time.

## 2022-09-27 NOTE — ED Notes (Addendum)
Arrives from [facility] for MRI. Pt A&Ox4, denies complaints at this time. Reports left upper extremity feel "almost normal. Grips equal bilateral.

## 2022-09-27 NOTE — ED Notes (Signed)
Dinner provided.

## 2022-09-27 NOTE — Progress Notes (Signed)
Patient has an abandoned lead from previous pacemaker. Per our radiologist, this is a contraindication for MRI. Unable to perform the MRI as requested. Sent message to Dr Sheppard Coil and RN to inform them of this.

## 2022-09-28 ENCOUNTER — Encounter (HOSPITAL_COMMUNITY): Payer: Self-pay | Admitting: Family Medicine

## 2022-09-28 DIAGNOSIS — I639 Cerebral infarction, unspecified: Secondary | ICD-10-CM | POA: Diagnosis not present

## 2022-09-28 DIAGNOSIS — R531 Weakness: Secondary | ICD-10-CM | POA: Diagnosis not present

## 2022-09-28 DIAGNOSIS — E119 Type 2 diabetes mellitus without complications: Secondary | ICD-10-CM | POA: Diagnosis not present

## 2022-09-28 DIAGNOSIS — Z7984 Long term (current) use of oral hypoglycemic drugs: Secondary | ICD-10-CM

## 2022-09-28 DIAGNOSIS — I6521 Occlusion and stenosis of right carotid artery: Secondary | ICD-10-CM

## 2022-09-28 DIAGNOSIS — I63231 Cerebral infarction due to unspecified occlusion or stenosis of right carotid arteries: Secondary | ICD-10-CM

## 2022-09-28 DIAGNOSIS — I480 Paroxysmal atrial fibrillation: Secondary | ICD-10-CM | POA: Diagnosis not present

## 2022-09-28 HISTORY — DX: Type 2 diabetes mellitus without complications: E11.9

## 2022-09-28 HISTORY — DX: Cerebral infarction due to unspecified occlusion or stenosis of right carotid arteries: I63.231

## 2022-09-28 LAB — LIPID PANEL
Cholesterol: 110 mg/dL (ref 0–200)
HDL: 48 mg/dL (ref >40)
LDL Cholesterol: 55 mg/dL (ref 0–99)
Total CHOL/HDL Ratio: 2.3 RATIO
Triglycerides: 34 mg/dL (ref <150)
VLDL: 7 mg/dL (ref 0–40)

## 2022-09-28 LAB — BASIC METABOLIC PANEL
Anion gap: 9 (ref 5–15)
BUN: 15 mg/dL (ref 8–23)
CO2: 23 mmol/L (ref 22–32)
Calcium: 9.1 mg/dL (ref 8.9–10.3)
Chloride: 110 mmol/L (ref 98–111)
Creatinine, Ser: 1.15 mg/dL (ref 0.61–1.24)
GFR, Estimated: >60 mL/min (ref >60)
Glucose, Bld: 105 mg/dL — ABNORMAL HIGH (ref 70–99)
Potassium: 3.9 mmol/L (ref 3.5–5.1)
Sodium: 142 mmol/L (ref 135–145)

## 2022-09-28 LAB — CBC
HCT: 42.8 % (ref 39.0–52.0)
Hemoglobin: 14.3 g/dL (ref 13.0–17.0)
MCH: 31.1 pg (ref 26.0–34.0)
MCHC: 33.4 g/dL (ref 30.0–36.0)
MCV: 93 fL (ref 80.0–100.0)
Platelets: 142 10*3/uL — ABNORMAL LOW (ref 150–400)
RBC: 4.6 MIL/uL (ref 4.22–5.81)
RDW: 13.5 % (ref 11.5–15.5)
WBC: 8.1 10*3/uL (ref 4.0–10.5)
nRBC: 0 % (ref 0.0–0.2)

## 2022-09-28 LAB — HEMOGLOBIN A1C
Hgb A1c MFr Bld: 6.1 % — ABNORMAL HIGH (ref 4.8–5.6)
Mean Plasma Glucose: 128.37 mg/dL

## 2022-09-28 MED ORDER — HEPARIN (PORCINE) 25000 UT/250ML-% IV SOLN
700.0000 [IU]/h | INTRAVENOUS | Status: DC
  Administered 2022-09-28: 1250 [IU]/h via INTRAVENOUS
  Administered 2022-09-29: 1200 [IU]/h via INTRAVENOUS
  Administered 2022-09-30: 850 [IU]/h via INTRAVENOUS
  Filled 2022-09-28 (×3): qty 250

## 2022-09-28 MED ORDER — ASPIRIN 325 MG PO TBEC
325.0000 mg | DELAYED_RELEASE_TABLET | Freq: Every day | ORAL | Status: DC
  Administered 2022-09-28: 325 mg via ORAL
  Filled 2022-09-28: qty 1

## 2022-09-28 MED ORDER — ASPIRIN 81 MG PO TBEC
81.0000 mg | DELAYED_RELEASE_TABLET | Freq: Every day | ORAL | Status: DC
  Administered 2022-09-29 – 2022-10-02 (×3): 81 mg via ORAL
  Filled 2022-09-28 (×3): qty 1

## 2022-09-28 MED ORDER — ASPIRIN 300 MG RE SUPP: 300.0000 mg | Freq: Every day | RECTAL | Status: DC

## 2022-09-28 NOTE — Consult Note (Addendum)
Hospital Consult    Reason for Consult:  symptomatic right ICA stenosis  Requesting Physician:  Murray Hodgkins MD MRN #:  [id number]  History of Present Illness: [name] is a 81 y.o. male with a past medical history significant for CAD, sick sinus syndrome s/p pacemaker, hyperlipidemia, hypertension, atrial fibrillation on Xarelto, COPD, and CHF presenting to the emergency room on 09/26/2022 with left-sided weakness and numbness.  The patient is usually on Xarelto for atrial fibrillation, but it was held on October 25 for right heart cath.  The patient states that he went to bed Friday night normal and woke up Saturday morning with left-sided weakness/paralysis.  He did not have any slurred speech, headache, or vision changes.  Since Saturday he has had most of the strength returned on his left side but is unable to hold things in his left hand.  He also continues to have numbness in his left fingertips.  CT head showed no acute abnormalities.  MRI is pending due to the patient's pacemaker.   He denies any history of stroke.  The pt is on a statin for cholesterol management.  The pt is not on a daily aspirin.   Other AC:  Xarelto The pt is on telmisartan, metoprolol for hypertension.   The pt is not diabetic.   Tobacco hx:  former  Past Medical History:  Diagnosis Date   Abdominal pain, epigastric    Allergic rhinitis, cause unspecified    Arthritis    BPH (benign prostatic hyperplasia)    CHF (congestive heart failure) (HCC)    Chronic anticoagulation    xarelto   Coronary artery calcification seen on CAT scan 12/26/2012   Depressive disorder, not elsewhere classified    Diverticulosis of colon (without mention of hemorrhage)    Esophageal reflux    High risk medication use    Hyperlipidemia    Hyperplastic rectal polyp    Hypertension    Lumbago    Pacemaker    Paralyzed hemidiaphragm    LEFT   Persistent atrial fibrillation (HCC)    Sick sinus syndrome ([place])  05/16/2009   s/p PPM ([employer]) by [name]    Past Surgical History:  Procedure Laterality Date   CYSTOSCOPY WITH RETROGRADE PYELOGRAM, URETEROSCOPY AND STENT PLACEMENT Left 12/30/2021   Procedure: CYSTOSCOPY WITH RETROGRADE PYELOGRAM, URETEROSCOPY;  Surgeon: Remi Haggard, MD;  Location: [facility];  Service: Urology;  Laterality: Left;   ESOPHAGOGASTRODUODENOSCOPY N/A 12/05/2013   Procedure: ESOPHAGOGASTRODUODENOSCOPY (EGD);  Surgeon: [name], MD;  Location: [facility];  Service: Endoscopy;  Laterality: N/A;   ESOPHAGOGASTRODUODENOSCOPY N/A 07/04/2015   Procedure: ESOPHAGOGASTRODUODENOSCOPY (EGD);  Surgeon: [name], MD;  Location: [facility];  Service: Endoscopy;  Laterality: N/A;   EXTRACORPOREAL SHOCK WAVE LITHOTRIPSY Left 10/13/2021   Procedure: EXTRACORPOREAL SHOCK WAVE LITHOTRIPSY (ESWL);  Surgeon: Lucas Mallow, MD;  [facility];  Service: Urology;  Laterality: Left;  90 MINS   INSERT / REPLACE / REMOVE PACEMAKER  05/2009    [employer] [id number]  (serial number [id number]) dual-chamber pacemaker   LEAD REVISION/REPAIR N/A 10/15/2020   Procedure: LEAD REVISION/REPAIR;  Surgeon: Thompson Grayer, MD;  Location: [facility];  Service: Cardiovascular;  Laterality: N/A;   PACEMAKER PLACEMENT Bilateral    '05   PPM GENERATOR CHANGEOUT N/A 10/15/2020   Procedure: PPM GENERATOR CHANGEOUT;  Surgeon: Thompson Grayer, MD;  Location: [facility] [facility];  Service: Cardiovascular;  Laterality: N/A;   RIGHT  HEART CATH N/A 09/09/2022   Procedure: RIGHT HEART CATH;  Surgeon: Early Osmond, MD;  Location: [facility];  Service: Cardiovascular;  Laterality: N/A;    No Known Allergies  Prior to Admission medications   Medication Sig Start Date End Date Taking? Authorizing Provider  acetaminophen (TYLENOL) 650 MG CR tablet Take 1,300 mg by mouth in the morning and at bedtime.   Yes [provider]   amLODipine (NORVASC) 5 MG tablet Take 1 tablet (5 mg total) by mouth daily. 08/28/22 08/28/23 Yes Biagio Borg, MD  atorvastatin (LIPITOR) 80 MG tablet TAKE 1 TABLET BY MOUTH EVERY DAY Patient taking differently: Take 80 mg by mouth daily. 01/13/22  Yes Biagio Borg, MD  Budeson-Glycopyrrol-Formoterol (BREZTRI AEROSPHERE) 160-9-4.8 MCG/ACT AERO 1 puffs twice per day 06/17/22  Yes Biagio Borg, MD  Cholecalciferol (VITAMIN D3) 50 MCG (2000 UT) TABS Take 2,000 Units by mouth daily.   Yes [provider]  dorzolamide-timolol (COSOPT) 22.3-6.8 MG/ML ophthalmic solution Place 1 drop into both eyes in the morning and at bedtime. 01/10/20  Yes [provider]  ezetimibe (ZETIA) 10 MG tablet Take 1 tablet (10 mg total) by mouth daily. 07/29/22  Yes Early Osmond, MD  fexofenadine (ALLEGRA) 180 MG tablet TAKE 1 TABLET (180 MG TOTAL) BY MOUTH DAILY. Patient taking differently: Take 180 mg by mouth daily. 12/10/15  Yes Biagio Borg, MD  flecainide (TAMBOCOR) 100 MG tablet TAKE 1 TABLET BY MOUTH TWICE A DAY Patient taking differently: Take 100 mg by mouth 2 (two) times daily. 12/15/21  Yes Shirley Friar, PA-C  latanoprost (XALATAN) 0.005 % ophthalmic solution Place 1 drop into both eyes at bedtime.    Yes [provider]  meclizine (ANTIVERT) 12.5 MG tablet TAKE 1 TABLET BY MOUTH EVERY 8 HOURS AS NEEDED Patient taking differently: Take 12.5 mg by mouth 3 (three) times daily as needed for dizziness. 09/16/22  Yes Biagio Borg, MD  omeprazole (PRILOSEC) 40 MG capsule TAKE 1 CAPSULE BY MOUTH EVERY DAY AS NEEDED Patient taking differently: Take 40 mg by mouth daily. TAKE 1 CAPSULE BY MOUTH EVERY DAY AS NEEDED 12/15/21  Yes Biagio Borg, MD  psyllium (METAMUCIL) 0.52 g capsule Take 2 capsules by mouth daily.   Yes [provider]  rivaroxaban (XARELTO) 20 MG TABS tablet TAKE 1 TABLET BY MOUTH DAILY WITH SUPPER. Patient taking differently: Take 20 mg by mouth daily  with supper. 09/16/21  Yes Biagio Borg, MD  tamsulosin (FLOMAX) 0.4 MG CAPS capsule TAKE 1 CAPSULE BY MOUTH TWICE A DAY Patient taking differently: Take 0.4 mg by mouth 2 (two) times daily. 05/30/22  Yes Biagio Borg, MD  telmisartan (MICARDIS) 20 MG tablet Take 1 tablet (20 mg total) by mouth at bedtime. 03/27/22  Yes Early Osmond, MD  traMADol (ULTRAM) 50 MG tablet TAKE 1 TABLET BY MOUTH EVERY 6 HOURS AS NEEDED. FOR PAIN Patient taking differently: Take 50 mg by mouth every 6 (six) hours as needed for moderate pain. 04/08/22  Yes Biagio Borg, MD  empagliflozin (JARDIANCE) 10 MG TABS tablet Take 1 tablet (10 mg total) by mouth daily before breakfast. Patient not taking: Reported on 09/27/2022 07/29/22   Early Osmond, MD  metoprolol succinate (TOPROL XL) 25 MG 24 hr tablet Take 1 tablet (25 mg total) by mouth at bedtime. Patient not taking: Reported on 09/27/2022 03/27/22   Early Osmond, MD    Social History  Socioeconomic History   Marital status: Married    Spouse name: Not on file   Number of children: 4   Years of education: Not on file   Highest education level: Not on file  Occupational History   Occupation: retired    Fish farm manager: RETIRED  Tobacco Use   Smoking status: Former    Packs/day: 0.50    Years: 20.00    Total pack years: 10.00    Types: Cigarettes    Quit date: 11/17/1991    Years since quitting: 30.8   Smokeless tobacco: Former    Types: Nurse, children's Use: Never used  Substance and Sexual Activity   Alcohol use: No   Drug use: No   Sexual activity: Never  Other Topics Concern   Not on file  Social History Narrative   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    Caffeine 1-2 day cups   Right handed   Tri-level home      Social Determinants of Health   Financial Resource Strain: Low Risk  (08/03/2022)   Overall Financial Resource Strain (CARDIA)    Difficulty of Paying Living Expenses: Not hard at all  Food Insecurity: No Food Insecurity  (08/03/2022)   Hunger Vital Sign    Worried About Running Out of Food in the Last Year: Never true    Ran Out of Food in the Last Year: Never true  Transportation Needs: No Transportation Needs (08/03/2022)   PRAPARE - Hydrologist (Medical): No    Lack of Transportation (Non-Medical): No  Physical Activity: Inactive (08/03/2022)   Exercise Vital Sign    Days of Exercise per Week: 0 days    Minutes of Exercise per Session: 0 min  Stress: No Stress Concern Present (08/03/2022)   [place]    Feeling of Stress : Not at all  Social Connections: [place] (08/03/2022)   Social Connection and Isolation Panel [NHANES]    Frequency of Communication with Friends and Family: More than three times a week    Frequency of Social Gatherings with Friends and Family: More than three times a week    Attends Religious Services: More than 4 times per year    Active Member of [employer] or Organizations: Yes    Attends Music therapist: More than 4 times per year    Marital Status: Married  Human resources officer Violence: Not At Risk (05/20/2021)   Humiliation, Afraid, Rape, and Kick questionnaire    Fear of Current or Ex-Partner: No    Emotionally Abused: No    Physically Abused: No    Sexually Abused: No     Family History  Problem Relation Age of Onset   Diabetes Mother    Hypertension Mother    Hypertension Father    Lung cancer Father    Cancer Father    Asthma Sister    Hypertension Sister    Arthritis Paternal Grandmother    Hypertension Brother    Hypertension Daughter    Hypertension Son     ROS: '[x]'$  Positive   '[ ]'$  Negative   '[ ]'$  All sytems reviewed and are negative  Cardiac: '[]'$  chest pain/pressure '[]'$  hx MI '[]'$  SOB   Vascular: '[]'$  pain in legs while walking '[]'$  pain in legs at rest '[]'$  pain in legs at night '[]'$  non-healing ulcers '[]'$  hx of DVT '[]'$  swelling in  legs  Pulmonary: '[]'$  asthma/wheezing '[]'$  home  O2  Neurologic: '[]'$  hx of CVA '[]'$  mini stroke   Hematologic: '[]'$  hx of cancer  Endocrine:   '[]'$  diabetes '[]'$  thyroid disease  GI '[x]'$  GERD  GU: '[]'$  CKD/renal failure '[]'$  HD--'[]'$  M/W/F or '[]'$  T/T/S  Psychiatric: '[]'$  anxiety '[x]'$  depression  Musculoskeletal: '[]'$  arthritis '[]'$  joint pain  Integumentary: '[]'$  rashes '[]'$  ulcers  Constitutional: '[]'$  fever  '[]'$  chills  Physical Examination  Vitals:   09/28/22 0500 09/28/22 0600  BP: (!) 120/92 (!) 132/91  Pulse: (!) 59 69  Resp: 18 16  Temp:  98 F (36.7 C)  SpO2: 99% 99%   There is no height or weight on file to calculate BMI.  General:  WDWN in NAD Gait: Not observed HENT: WNL, normocephalic Pulmonary: normal non-labored breathing Cardiac: a-fib, without carotid bruit Abdomen:  soft, NT; aortic pulse is non palpable Skin: without rashes Vascular Exam/Pulses: Palpable femoral pulses bilaterally. Palpable and equal radial pulses bilaterally Musculoskeletal: no muscle wasting or atrophy  Neurologic: A&O X 3. Some L hand numbness Psychiatric:  The pt has Normal affect.   CBC    Component Value Date/Time   WBC 8.1 09/28/2022 0441   RBC 4.60 09/28/2022 0441   HGB 14.3 09/28/2022 0441   HGB 14.9 09/03/2022 1229   HCT 42.8 09/28/2022 0441   HCT 43.9 09/03/2022 1229   PLT 142 (L) 09/28/2022 0441   PLT 159 09/03/2022 1229   MCV 93.0 09/28/2022 0441   MCV 91 09/03/2022 1229   MCH 31.1 09/28/2022 0441   MCHC 33.4 09/28/2022 0441   RDW 13.5 09/28/2022 0441   RDW 13.9 09/03/2022 1229   LYMPHSABS 1.6 09/26/2022 1119   LYMPHSABS 2.1 09/18/2020 1549   MONOABS 0.8 09/26/2022 1119   EOSABS 0.0 09/26/2022 1119   EOSABS 0.1 09/18/2020 1549   BASOSABS 0.0 09/26/2022 1119   BASOSABS 0.1 09/18/2020 1549    BMET    Component Value Date/Time   NA 142 09/28/2022 0441   NA 140 09/03/2022 1229   K 3.9 09/28/2022 0441   CL 110 09/28/2022 0441   CO2 23 09/28/2022 0441   GLUCOSE 105  (H) 09/28/2022 0441   GLUCOSE 86 10/25/2006 0935   BUN 15 09/28/2022 0441   BUN 16 09/03/2022 1229   CREATININE 1.15 09/28/2022 0441   CREATININE 1.03 12/11/2015 0912   CALCIUM 9.1 09/28/2022 0441   GFRNONAA >60 09/28/2022 0441   GFRAA 89 09/18/2020 1549    COAGS: Lab Results  Component Value Date   INR 2.8 (H) 09/26/2022   INR 2.2 (H) 12/09/2020   INR 1.45 07/01/2015     Non-Invasive Vascular Imaging:   CT angio head/neck: Demonstrates right proximal ICA stenosis of 50%.  Left ICA stenosis less than 50%.  CT head with no acute findings  MRI head pending  ASSESSMENT/PLAN: This is a 81 y.o. 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  Left-sided weakness/numbness that started on Saturday, 11/11.  Patient has mostly returned to baseline with some residual left hand numbness. -CT head did not demonstrate any acute findings.  CT of the neck demonstrated right proximal ICA stenosis of 50% -The patient is currently holding Xarelto pending MRI, per neurology recommendation.  He is being admitted for further stroke work-up -He will be started on ASA 325 mg daily until his Xarelto was resumed -Will likely need a right carotid endarterectomy within the next week -Dr. Donzetta Matters  to evaluate pt and determine further plan   Vicente Serene, PA-C [facility] [phone]   I have independently interviewed and examined patient and agree with PA assessment and plan above.  Symptoms of left hand weakness mostly resolved.  I reviewed his CT angio does have calcified plaque at the carotid bifurcation although only measuring 50%.  I discussed with the patient if no other underlying etiology is discovered would likely benefit from right carotid endarterectomy to prevent further stroke.  I would not recommend stenting given patient's high level of circumferential calcium.  Patient demonstrates good  understanding.  I will follow-up work-up and discussed with neurology.  Keyna Blizard C. Donzetta Matters, MD [facility] [place] Office: [phone] Pager: [phone]

## 2022-09-28 NOTE — Progress Notes (Addendum)
STROKE TEAM PROGRESS NOTE   INTERVAL HISTORY No family at the bedside. VVS planning to do CEA later this week. Vital signs stable. Neurologically improving. Patient states he feels like he is at 70% when compared to his baseline.   Vitals:   09/28/22 0300 09/28/22 0400 09/28/22 0500 09/28/22 0600  BP: (!) 146/89 (!) 123/112 (!) 120/92 (!) 132/91  Pulse: (!) 59 64 (!) 59 69  Resp: '15 20 18 16  '$ Temp:    98 F (36.7 C)  TempSrc:    Oral  SpO2: 99% 100% 99% 99%   CBC:  Recent Labs  Lab 09/26/22 1119 09/28/22 0441  WBC 7.7 8.1  NEUTROABS 5.2  --   HGB 15.4 14.3  HCT 47.6 42.8  MCV 94.1 93.0  PLT 152 814*   Basic Metabolic Panel:  Recent Labs  Lab 09/26/22 1119 09/28/22 0441  NA 142 142  K 3.9 3.9  CL 106 110  CO2 26 23  GLUCOSE 108* 105*  BUN 16 15  CREATININE 0.98 1.15  CALCIUM 9.6 9.1   Lipid Panel:  Recent Labs  Lab 09/28/22 0441  CHOL 110  TRIG 34  HDL 48  CHOLHDL 2.3  VLDL 7  LDLCALC 55   HgbA1c:  Recent Labs  Lab 09/28/22 0441  HGBA1C 6.1*   Urine Drug Screen: No results for input(s): "LABOPIA", "COCAINSCRNUR", "LABBENZ", "AMPHETMU", "THCU", "LABBARB" in the last 168 hours.  Alcohol Level  Recent Labs  Lab 09/26/22 1119  ETH <10    IMAGING past 24 hours CT ANGIO HEAD NECK W WO CM  Result Date: 09/27/2022 CLINICAL DATA:  Stroke, follow up EXAM: CT ANGIOGRAPHY HEAD AND NECK TECHNIQUE: Multidetector CT imaging of the head and neck was performed using the standard protocol during bolus administration of intravenous contrast. Multiplanar CT image reconstructions and MIPs were obtained to evaluate the vascular anatomy. Carotid stenosis measurements (when applicable) are obtained utilizing NASCET criteria, using the distal internal carotid diameter as the denominator. RADIATION DOSE REDUCTION: This exam was performed according to the departmental dose-optimization program which includes automated exposure control, adjustment of the mA and/or kV  according to patient size and/or use of iterative reconstruction technique. CONTRAST:  43m OMNIPAQUE IOHEXOL 350 MG/ML SOLN COMPARISON:  None Available. FINDINGS: CT HEAD FINDINGS Brain: Hypoattenuation and loss of gray differentiation in the high right posterior frontal lobe, suspicious for acute infarct. No acute hemorrhage. Patchy white matter hypodensities, nonspecific but compatible with chronic microvascular ischemic disease. No evidence of mass lesion, midline shift, or hydrocephalus. Vascular: Calcific atherosclerosis. Skull: No acute fracture. Sinuses/Orbits: No acute fracture. Other: No mastoid effusions. Review of the MIP images confirms the above findings CTA NECK FINDINGS Aortic arch: Great vessel origins are patent. Without significant stenosis. Right carotid system: Atherosclerosis at the carotid bifurcation with proximally 50% stenosis. Retropharyngeal course of the ICA. Left carotid system: Atherosclerosis at the carotid bifurcation without greater than 50% stenosis. Retropharyngeal course of the ICA. Vertebral arteries: Right dominant. The non dominant left vertebral artery is small throughout its course. Both vertebral arteries are patent without significant stenosis. Skeleton: Chronic widening of the atlantodental interval (up to 6 mm), suggestive of instability/laxity. Severe multilevel degenerative change. Other neck: No acute findings. Upper chest: No acute findings. Review of the MIP images confirms the above findings CTA HEAD FINDINGS Anterior circulation: Bilateral intracranial ICAs, MCAs, and ACAs are patent without proximal hemodynamically significant stenosis. The low Posterior circulation: Small/non dominant left vertebral artery makes minimal contribution to the basilar artery, anatomic  variant. The dominant right vertebral artery, basilar artery and bilateral posterior cerebral arteries are patent without proximal hemodynamically significant stenosis. Venous sinuses: As permitted by  contrast timing, patent. Anatomic variants: Detailed above. Review of the MIP images confirms the above findings IMPRESSION: CT head: 1. Hypoattenuation and loss of gray differentiation in the high right posterior frontal lobe, suspicious for acute infarct. Recommend MRI to further assess if the patient is able. 2. No acute hemorrhage. CTA: 1. No emergent large vessel occlusion. 2. 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 3. Chronic widening of the atlantodental interval (up to 6 mm), suggestive of instability/laxity. Electronically Signed   By: Margaretha Sheffield M.D.   On: 09/27/2022 17:11    PHYSICAL EXAM GENERAL: Awake, alert, in no acute distress LUNGS: Normal respiratory effort. Non-labored breathing on room air CV: Regular rate and rhythm on telemetry  NEURO:  Mental Status: Awake, alert, and oriented to person, place, time, and situation. He/She is able to provide a clear and coherent history of present illness. Speech/Language: speech is clear and fluent.   Naming, repetition, fluency, and comprehension intact without aphasia  No neglect is noted Cranial Nerves:  II: PERRL visual fields full.  III, IV, VI: EOMI. Lid elevation symmetric and full.  V: Sensation is intact to light touch and symmetrical to face.  VII: Face is symmetric resting and smiling.  VIII: Hearing intact to voice IX, X: Palate elevation is symmetric. Phonation normal.  XI: Normal sternocleidomastoid and trapezius muscle strength XII: Tongue protrudes midline without fasciculations.   Motor: 5/5 strength to right upper extremity and bilateral lower extremities, 4+/5 strength to left upper extremity Sensation: Intact to light touch bilaterally in all four extremities but diminished in left upper extremity. No extinction to DSS present.  Gait: Deferred  ASSESSMENT/PLAN [name] is a 81 y.o. male with history of  CAD, sick sinus syndrome with pacemaker, hyperlipidemia, hypertension,  depression, atrial fibrillation on Xarelto, COPD, GERD, BPH and CHF who presents with left arm weakness and numbness which began Saturday morning. He resumed his xarelto 10/26 after having a cardiac catheterization the day before.  VVS plans to do a right CEA later this week after completion of stroke work up.   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   Repeat Head CT 24h after - Hypoattenuation and loss of gray differentiation in the high right posterior frontal lobe, suspicious for acute infarct CTA head & neck Approximately 50% stenosis of the right ICA origin in neck. Chronic widening of the atlantodental interval (up to 6 mm), suggestive of instability/laxity.   Not able to have MRI due to incompatible pacer leads 2D Echo EF 55% in 07/2022 LDL 55 HgbA1c 6.1 VTE prophylaxis - SCDs Xarelto (rivaroxaban) daily prior to admission, now on ASA 325. Will start heparin IV in preparation of left CEA, will continue ASA 81 after heparin IV started.  Therapy recommendations:  No follow up Disposition:  Pending  Right carotid stenosis CTA head & neck Approximately 50% stenosis of the right ICA origin in neck. VVS Dr Donzetta Matters on board Planing for right CEA  On heparin IV and ASA in preparation of CEA  Atrial fibrillation Home meds: flecainide, xarelto Currently on ASA 325->81 Will start heparin IV in preparation of left CEA  Hypertension Home meds:  amlodipine  Stable BP goal 130-160 before left CEA Long-term BP goal normotensive  Hyperlipidemia Home meds:  atorvastatin '80mg'$ , zetia '10mg'$  resumed in hospital LDL 55, goal < 70 Continue statin at discharge  Diabetes type II Controlled Home meds:  jardiance  HgbA1c 6.1, goal < 7.0 CBGs SSI PCP close follow up  Other Stroke Risk Factors Advanced Age >/= 65  Coronary artery disease Heart cath done by Dr. Ali Lowe on 10/25 due to  elevated PA pressures with instructions to continue current therapy.  1.  Mild pulmonary hypertension with a mean PA pressure of 28 mmHg. 2.  Mean RA pressure of 8 mmHg, RV pressure of 43/6 with RV end-diastolic pressure of 12 mmHg, mean wedge pressure of 7 mmHg, and PA pressure 45/24 with a mean of 28 mmHg. 3.  Fick cardiac output of 4.28 L/min and Fick cardiac index of 2.05 L/min/m. 4.  PVR of 4.75 Woods units. 5.  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 Congestive heart failure Last EF 55%   Hospital day # 1  Patient seen and examined by NP/APP with MD. MD to update note as needed.   Janine Ores, DNP, FNP-BC [facility] Pager: [phone]  ATTENDING NOTE: I reviewed above note and agree with the assessment and plan. Pt was seen and examined.   81 year old male with history of CAD, SSS with pacemaker, hypertension, hyperlipidemia, A-fib on Xarelto, CHF admitted for left arm numbness and weakness.  CT no acute abnormality initially but repeat after 24 hours showed right frontal small infarct.  CT head and neck showed right ICA at least 50% stenosis with severe calcification.  Not able to have MRI due to incompatible pacemaker leads.  LDL 55, A1c 6.1.  EF 55% in 07/2022, creatinine 1.15, platelet 142.  On exam, patient lying in bed, no family at bedside.  AOx3 no aphasia, follows simple commands.  Cranial nerves intact, left upper extremity mild pronator drift and mild dysmetria on finger-to-nose, otherwise neurologically intact.  Etiology for patient stroke likely due to right ICA high-grade stenosis with severe atherosclerosis.  Vascular surgery Dr. Donzetta Matters consulted, considering right carotid CEA.  We will  continue aspirin 81, hold off Xarelto for now and start heparin IV per stroke protocol in preparation for incoming surgery.  Continue statin and Zetia.  PT/OT no recommendation.  Will follow.  For detailed assessment and plan, please refer to above/below as I have made changes wherever appropriate.   Rosalin Hawking, MD PhD Stroke Neurology 09/28/2022 7:25 PM     To contact Stroke Continuity provider, please refer to [email or web address] After hours, contact General Neurology

## 2022-09-28 NOTE — Evaluation (Signed)
Occupational Therapy Evaluation Patient Details Name: [name] MRN: [id number] DOB: August 15, 1941 Today's Date: 09/28/2022   History of Present Illness Pt is a 81 y/o male who presented with L sided weakness/numbness. CT head negative, pending MRI brain (pending pacemaker compatibility). PMH: CAD, SSS s/p PPM, HLD, depression, HTN, a fib, COPD, GERD, BPH, CHF   Clinical Impression   PTA, pt lives with spouse, typically Independent in all ADLs, IADLs and mobility without AD though limited in gait distance due to chronic SOB. Pt presents now fairly close to baseline with deficits in L UE sensation, dynamic standing balance and L shoulder strength (reports arthritis). Overall, pt is Independent for UB ADLs, Supervision for LB ADLs and min guard for hallway mobility without AD. Educated re: strategies for LUE sensation, continued mobility to improve balance with pt verbalizing understanding. Will follow acutely primarily for LUE but anticipate no OT needs at DC.       Recommendations for follow up therapy are one component of a multi-disciplinary discharge planning process, led by the attending physician.  Recommendations may be updated based on patient status, additional functional criteria and insurance authorization.   Follow Up Recommendations  No OT follow up    Assistance Recommended at Discharge PRN  Patient can return home with the following      Functional Status Assessment  Patient has had a recent decline in their functional status and demonstrates the ability to make significant improvements in function in a reasonable and predictable amount of time.  Equipment Recommendations  None recommended by OT    Recommendations for Other Services       Precautions / Restrictions Precautions Precautions: Fall Restrictions Weight Bearing Restrictions: No      Mobility Bed Mobility Overal bed mobility: Needs Assistance Bed Mobility: Supine to Sit, Sit to Supine     Supine to  sit: Min assist Sit to supine: Modified independent (Device/Increase time)   General bed mobility comments: Min A to lift trunk to edge of stretcher    Transfers Overall transfer level: Independent Equipment used: None                      Balance Overall balance assessment: Mild deficits observed, not formally tested                                         ADL either performed or assessed with clinical judgement   ADL Overall ADL's : Needs assistance/impaired Eating/Feeding: Independent   Grooming: Modified independent;Standing   Upper Body Bathing: Modified independent   Lower Body Bathing: Supervison/ safety;Sit to/from stand   Upper Body Dressing : Modified independent   Lower Body Dressing: Supervision/safety   Toilet Transfer: Min guard;Ambulation   Toileting- Clothing Manipulation and Hygiene: Supervision/safety;Sit to/from stand;Sitting/lateral lean Toileting - Clothing Manipulation Details (indicate cue type and reason): able to stand and use urinal at bedside, slower coordination due to L UE numbness but able to manage     Functional mobility during ADLs: Min guard General ADL Comments: Mild unsteadiness with instance of scissoring gait without AD though pt reports this is really his first time up and walking since transfer to [facility]. Educated pt to continue using L UE during tasks, looking at hand while holding items, general progression of sensation of LUE     Vision Baseline Vision/History: 1 Wears glasses Ability to See in Adequate Light:  0 Adequate Patient Visual Report: No change from baseline Vision Assessment?: No apparent visual deficits     Perception     Praxis      Pertinent Vitals/Pain Pain Assessment Pain Assessment: No/denies pain     Hand Dominance Right   Extremity/Trunk Assessment Upper Extremity Assessment Upper Extremity Assessment: LUE deficits/detail LUE Deficits / Details: reports continued numbness,  like it "feels dead". grip strength, biceps/triceps strength 4+/5, 3+/5 shoulders though reports OA. has to look at items when holding in order to not drop them, slower coordination d/t this LUE Sensation: decreased light touch LUE Coordination: decreased fine motor   Lower Extremity Assessment Lower Extremity Assessment: Defer to PT evaluation   Cervical / Trunk Assessment Cervical / Trunk Assessment: Normal   Communication Communication Communication: No difficulties   Cognition Arousal/Alertness: Awake/alert Behavior During Therapy: WFL for tasks assessed/performed Overall Cognitive Status: Within Functional Limits for tasks assessed                                       General Comments       Exercises     Shoulder Instructions      Home Living Family/patient expects to be discharged to:: Private residence Living Arrangements: Spouse/significant other Available Help at Discharge: Family;Available 24 hours/day Type of Home: House Home Access: Stairs to enter [employer] of Steps: 3-4 Entrance Stairs-Rails: Right Home Layout: Two level;1/2 bath on main level     Bathroom Shower/Tub: Occupational psychologist: Standard     Home Equipment: None          Prior Functioning/Environment Prior Level of Function : Independent/Modified Independent;Driving             Mobility Comments: no use of AD, does report SOB when walking to mailbox, has to take breaks with mobility          OT Problem List: Decreased strength;Impaired balance (sitting and/or standing);Decreased coordination;Impaired sensation;Impaired UE functional use      OT Treatment/Interventions: Therapeutic exercise;Self-care/ADL training;DME and/or AE instruction;Therapeutic activities    OT Goals(Current goals can be found in the care plan section) Acute Rehab OT Goals Patient Stated Goal: get up to a room OT Goal Formulation: With patient Time For Goal  Achievement: 10/12/22 Potential to Achieve Goals: Good ADL Goals Pt Will Transfer to Toilet: Independently;ambulating Pt/caregiver will Perform Home Exercise Program: Increased strength;Left upper extremity;With theraband;With theraputty;Independently;With written HEP provided Additional ADL Goal #1: Pt to gather ADL/IADL items Independently without LOB or safety concerns  OT Frequency: Min 2X/week    Co-evaluation              AM-PAC OT "6 Clicks" Daily Activity     Outcome Measure Help from another person eating meals?: None Help from another person taking care of personal grooming?: None Help from another person toileting, which includes using toliet, bedpan, or urinal?: A Little Help from another person bathing (including washing, rinsing, drying)?: A Little Help from another person to put on and taking off regular upper body clothing?: None Help from another person to put on and taking off regular lower body clothing?: A Little 6 Click Score: 21   End of Session Equipment Utilized During Treatment: Gait belt Nurse Communication: Mobility status  Activity Tolerance: Patient tolerated treatment well Patient left: in bed;with call bell/phone within reach  OT Visit Diagnosis: Muscle weakness (generalized) (M62.81)  Time: 4210-3128 OT Time Calculation (min): 16 min Charges:  OT General Charges $OT Visit: 1 Visit OT Evaluation $OT Eval Low Complexity: 1 Low  Malachy Chamber, OTR/L Acute Rehab Services Office: [phone]   Layla Maw 09/28/2022, 8:02 AM

## 2022-09-28 NOTE — Hospital Course (Signed)
81 year old man complex PMH presenting with left arm weakness and numbness.  Admitted for suspected stroke.

## 2022-09-28 NOTE — Progress Notes (Signed)
ANTICOAGULATION CONSULT NOTE - Initial Consult  Pharmacy Consult for heparin Indication: atrial fibrillation  No Known Allergies  Patient Measurements:   Last weight 08/19/22 = 204 lb Heparin Dosing Weight: 92 kg  Vital Signs: Temp: 98.4 F (36.9 C) (11/13 1814) Temp Source: Oral (11/13 1814) BP: 127/87 (11/13 1814) Pulse Rate: 70 (11/13 1814)  Labs: Recent Labs    09/26/22 1119 09/28/22 0441  HGB 15.4 14.3  HCT 47.6 42.8  PLT 152 142*  APTT 41*  --   LABPROT 29.0*  --   INR 2.8*  --   CREATININE 0.98 1.15    CrCl cannot be calculated (Unknown ideal weight.).   Medical History: Past Medical History:  Diagnosis Date   Abdominal pain, epigastric    Allergic rhinitis, cause unspecified    Arthritis    BPH (benign prostatic hyperplasia)    CHF (congestive heart failure) (HCC)    Chronic anticoagulation    xarelto   Coronary artery calcification seen on CAT scan 12/26/2012   Depressive disorder, not elsewhere classified    Diabetes mellitus treated with oral medication ([place]) 09/28/2022   Diverticulosis of colon (without mention of hemorrhage)    Esophageal reflux    High risk medication use    Hyperlipidemia    Hyperplastic rectal polyp    Hypertension    Lumbago    Pacemaker    Paralyzed hemidiaphragm    LEFT   Persistent atrial fibrillation (HCC)    Sick sinus syndrome ([place]) 05/16/2009   s/p PPM ([employer]) by [name]      Assessment: 81 yo M on rivaroxaban PTA for afib now admitted with CVA pending CEA procedure 11/14. Last dose 11/10 at 20:00. Pharmacy consulted for heparin.  Goal of Therapy:  Heparin level 0.3 -0.5 units/ml aPTT 66- 85 sec Monitor platelets by anticoagulation protocol: Yes   Plan:  Heparin 1250 units/hr, no bolus F/u aPTT until correlates with heparin level  Monitor daily aPTT, heparin level, CBC Monitor for signs/symptoms of bleeding  F/u restart rivaroxaban post-CEA    Benetta Spar, PharmD, BCPS, BCCP Clinical  Pharmacist  Please check AMION for all [place] phone numbers After 10:00 PM, call [place]

## 2022-09-28 NOTE — Evaluation (Signed)
Speech Language Pathology Evaluation Patient Details Name: [name] MRN: [id number] DOB: Apr 04, 1941 Today's Date: 09/28/2022 Time: 6503-5465 SLP Time Calculation (min) (ACUTE ONLY): 19 min  Problem List:  Patient Active Problem List   Diagnosis Date Noted   Left-sided weakness 09/26/2022   Pain and swelling of right lower leg 06/01/2022   Hypokalemia 05/20/2022   Pneumonia 05/20/2022   PAF (paroxysmal atrial fibrillation) ([place]) 05/19/2022   AKI (acute kidney injury) ([place]) 05/19/2022   Hyponatremia 05/19/2022   Hypercalcemia 05/19/2022   Obesity, Class I, BMI 30-34.9 05/19/2022   Pneumonia of left lower lobe due to infectious organism 05/18/2022   Hypersomnolence 04/15/2022   CHF (congestive heart failure) ([place]) 03/24/2022   Bilateral leg pain 11/24/2021   Left foot pain 10/29/2021   Rash 09/01/2021   Aortic atherosclerosis ([place]) 05/20/2021   Gross hematuria 12/09/2020   Chronic anticoagulation 12/09/2020   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   Vitamin D deficiency 05/07/2020   Wheezing 05/07/2020   Eustachian tube disorder, right 01/15/2020   Microhematuria 11/07/2019   Fracture of orbital floor, blow-out, closed ([place]) 09/15/2019   Strain of lumbar region 02/10/2018   Hematuria 11/01/2017   Nausea vomiting and diarrhea 05/08/2017   Lumbar radiculopathy 12/01/2016   Dizziness 11/18/2016   Nocturia 11/18/2016   Hyperglycemia 11/18/2016   Bilateral hip pain 10/20/2016   Bilateral ankle pain 10/20/2016   Bilateral hand pain 10/20/2016   Fatigue 04/28/2016   Acute sinus infection 01/28/2016   Cough 01/28/2016   Chronic pain 07/11/2015   Alcohol abuse 07/01/2015   Increased prostate specific antigen (PSA) velocity 06/26/2014   Neck mass 12/26/2013   Arthritis of ankle or foot, degenerative 12/16/2013   Retrocalcaneal bursitis 12/16/2013   Abdominal pain 11/14/2013   Near syncope 05/06/2013   abnormal (EKG)  05/06/2013   Dry mouth 03/02/2013   Coronary artery calcification seen on CAT scan 12/26/2012   Lumbar back pain 12/26/2012   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   Leg pain, right 04/17/2012   Leg swelling 04/17/2012   DOE (dyspnea on exertion) 02/26/2012   Encounter for well adult exam with abnormal findings 12/24/2011   Cervical radiculopathy 12/24/2011   Shortness of breath 12/18/2010   SYNCOPE 12/04/2010   FREQUENCY, URINARY 11/24/2010   NECK PAIN, RIGHT 08/21/2010   Atrial fibrillation ([place]) 05/29/2010   Sick sinus syndrome ([place]) 05/20/2010   NAUSEA AND VOMITING 05/20/2010   Rhabdomyolysis 03/25/2010   SHOULDER PAIN, RIGHT 12/09/2009   ACHILLES TENDINITIS 12/09/2009   BPH (benign prostatic hyperplasia) 06/26/2009   WEIGHT LOSS-ABNORMAL 06/05/2009   NAUSEA 06/05/2009   PERSONAL HX COLONIC POLYPS 06/05/2009   BRADYCARDIA 05/27/2009   FATIGUE 05/27/2009   Hyperlipidemia 05/30/2008   Depression 05/30/2008   Essential hypertension 05/30/2008   Allergic rhinitis 05/30/2008   GERD 05/30/2008   Diverticulosis of colon (without mention of hemorrhage) 05/30/2008   OVERACTIVE BLADDER 05/30/2008   LOW BACK PAIN 05/30/2008   Past Medical History:  Past Medical History:  Diagnosis Date   Abdominal pain, epigastric    Allergic rhinitis, cause unspecified    Arthritis    BPH (benign prostatic hyperplasia)    CHF (congestive heart failure) (HCC)    Chronic anticoagulation    xarelto   Coronary artery calcification seen on CAT scan 12/26/2012   Depressive disorder, not elsewhere classified    Diverticulosis of colon (without mention of hemorrhage)    Esophageal reflux    High risk medication  use    Hyperlipidemia    Hyperplastic rectal polyp    Hypertension    Lumbago    Pacemaker    Paralyzed hemidiaphragm    LEFT   Persistent atrial fibrillation [facility])    Sick sinus syndrome ([place]) 05/16/2009   s/p PPM ([employer]) by [name]   Past Surgical History:  Past Surgical  History:  Procedure Laterality Date   CYSTOSCOPY WITH RETROGRADE PYELOGRAM, URETEROSCOPY AND STENT PLACEMENT Left 12/30/2021   Procedure: CYSTOSCOPY WITH RETROGRADE PYELOGRAM, URETEROSCOPY;  Surgeon: Remi Haggard, MD;  Location: [facility];  Service: Urology;  Laterality: Left;   ESOPHAGOGASTRODUODENOSCOPY N/A 12/05/2013   Procedure: ESOPHAGOGASTRODUODENOSCOPY (EGD);  Surgeon: [name], MD;  Location: [facility];  Service: Endoscopy;  Laterality: N/A;   ESOPHAGOGASTRODUODENOSCOPY N/A 07/04/2015   Procedure: ESOPHAGOGASTRODUODENOSCOPY (EGD);  Surgeon: [name], MD;  Location: [place];  Service: Endoscopy;  Laterality: N/A;   EXTRACORPOREAL SHOCK WAVE LITHOTRIPSY Left 10/13/2021   Procedure: EXTRACORPOREAL SHOCK WAVE LITHOTRIPSY (ESWL);  Surgeon: Lucas Mallow, MD;  [facility];  Service: Urology;  Laterality: Left;  90 MINS   INSERT / REPLACE / REMOVE PACEMAKER  05/2009    [employer] [id number]  (serial number [id number]) dual-chamber pacemaker   LEAD REVISION/REPAIR N/A 10/15/2020   Procedure: LEAD REVISION/REPAIR;  Surgeon: Thompson Grayer, MD;  Location: [place] [facility];  Service: Cardiovascular;  Laterality: N/A;   PACEMAKER PLACEMENT Bilateral    '05   PPM GENERATOR CHANGEOUT N/A 10/15/2020   Procedure: PPM GENERATOR CHANGEOUT;  Surgeon: Thompson Grayer, MD;  Location: [place] [facility];  Service: Cardiovascular;  Laterality: N/A;   RIGHT HEART CATH N/A 09/09/2022   Procedure: RIGHT HEART CATH;  Surgeon: Early Osmond, MD;  Location: [place] [facility];  Service: Cardiovascular;  Laterality: N/A;   HPI:  Pt is a 81 y/o male who presented with L sided weakness/numbness. CT head negative, pending MRI brain (pending pacemaker compatibility). PMH: CAD, SSS s/p PPM, HLD, depression, HTN, a fib, COPD, GERD, BPH, CHF   Assessment / Plan / Recommendation Clinical Impression  Pt presents with a mild-moderate  cognitive-linguistic disorder marked by deficits in memory, problem solving and decreased visual-spatial awareness. Pt was alert and actively participated in evaluation. He is oriented to himself and the date. Pt reports no perceived recent changes in his cognition. Oral motor assessment was unremarkable. [facility] Mental Status evaluation was administered to further assess cognitive-linguistic functioning, the pt scored a 19/30 signifying the presence of a mild to moderate disorder. Memory deficits noted as the pt was unable to accurately recall all items from a story or during a delayed recall task. Semantic cues useful to assist in retrieval of information. During the delayed recall task, semantic cues aided pt tin recalling the remaining two items he previously did not name. A deficit in visual spatial awareness identified in a clock drawing task, where pt did not adequately space numbers to resemble a clock. Minor deficits in problem solving and divergent naming also noted. Verbal comprehension and articulation present as within functional limits, however, throughout session pt displayed decreased eye contact. Pt reports he controls his finances and medications, and at baseline he has difficulty recalling when to take medications due to the large amount he has to take. SLP will follow acutely.    SLP Assessment  SLP Recommendation/Assessment: Patient needs continued Speech Lanaguage Pathology Services SLP Visit Diagnosis: Cognitive communication deficit (R41.841)  Recommendations for follow up therapy are one component of a multi-disciplinary discharge planning process, led by the attending physician.  Recommendations may be updated based on patient status, additional functional criteria and insurance authorization.    Follow Up Recommendations  Acute inpatient rehab (3hours/day)    Assistance Recommended at Discharge  PRN  Functional Status Assessment Patient has had a recent  decline in their functional status and demonstrates the ability to make significant improvements in function in a reasonable and predictable amount of time.  Frequency and Duration min 1 x/week  2 weeks      SLP Evaluation Cognition  Overall Cognitive Status: Impaired/Different from baseline Arousal/Alertness: Awake/alert Orientation Level: Oriented to person Year: 2023 Day of Week: Correct Attention: Sustained Sustained Attention: Appears intact Memory: Impaired Memory Impairment: Retrieval deficit;Decreased recall of new information Awareness: Appears intact Problem Solving: Impaired Problem Solving Impairment: Verbal basic       Comprehension  Auditory Comprehension Overall Auditory Comprehension: Appears within functional limits for tasks assessed Yes/No Questions: Not tested Commands: Within Functional Limits Conversation: Simple Visual Recognition/Discrimination Discrimination: Not tested Reading Comprehension Reading Status: Not tested    Expression Expression Primary Mode of Expression: Verbal Verbal Expression Overall Verbal Expression: Appears within functional limits for tasks assessed Initiation: No impairment Level of Generative/Spontaneous Verbalization: Phrase Repetition: No impairment Naming: Impairment Responsive: 76-100% accurate Confrontation: Not tested Convergent: Not tested Divergent: 75-100% accurate Pragmatics: Impairment Impairments: Eye contact Non-Verbal Means of Communication: Not applicable Written Expression Dominant Hand: Right Written Expression: Not tested   Oral / Motor  Oral Motor/Sensory Function Overall Oral Motor/Sensory Function: Within functional limits Motor Speech Overall Motor Speech: Appears within functional limits for tasks assessed Respiration: Within functional limits Phonation: Normal Resonance: Within functional limits Articulation: Within functional limitis Intelligibility: Intelligible Motor Planning: Not  tested Motor Speech Errors: Not applicable            [employer] Student SLP  09/28/2022, 11:42 AM

## 2022-09-28 NOTE — Evaluation (Addendum)
Physical Therapy Evaluation Patient Details Name: [name] MRN: [id number] DOB: [birth year] Today's Date: 09/28/2022  History of Present Illness  Pt is a 81 y/o male who presented with L sided weakness/numbness. CT head negative, pending MRI brain (pending pacemaker compatibility). PMH: CAD, SSS s/p PPM, HLD, depression, HTN, a fib, COPD, GERD, BPH, CHF  Clinical Impression  Pt was seen for initial mobility and with high level balance skills is close S to CGA, but can walk with supervision.  Recommend he use a RW at home, which pt doesn't have, and focus his inpt therapy on balance skills to increase safety and reduce fall risk.  Pt is recommended also to be supervised with family, and has been already having issues of fatigue with gait on outdoor surfaces such as to get the mail.  Follow acutely as planned for goals on POC, concentrate on balance, and follow up with outpatient PT.  He is possibly going to have a vascular procedure, and will need reassessing for these recommendations at that time.     Recommendations for follow up therapy are one component of a multi-disciplinary discharge planning process, led by the attending physician.  Recommendations may be updated based on patient status, additional functional criteria and insurance authorization.  Follow Up Recommendations Outpatient PT      Assistance Recommended at Discharge Frequent or constant Supervision/Assistance  Patient can return home with the following  A lot of help with walking and/or transfers;A little help with bathing/dressing/bathroom;Assistance with cooking/housework;Assist for transportation;Help with stairs or ramp for entrance    Equipment Recommendations None recommended by PT  Recommendations for Other Services       Functional Status Assessment Patient has had a recent decline in their functional status and demonstrates the ability to make significant improvements in function in a reasonable and predictable  amount of time.     Precautions / Restrictions Precautions Precautions: Fall Precaution Comments: L side weak Restrictions Weight Bearing Restrictions: No      Mobility  Bed Mobility Overal bed mobility: Needs Assistance Bed Mobility: Supine to Sit, Sit to Supine     Supine to sit: Min guard, Min assist Sit to supine: Supervision        Transfers Overall transfer level: Modified independent                      Ambulation/Gait Ambulation/Gait assistance: Min guard Gait Distance (Feet): 75 Feet Assistive device: 1 person hand held assist Gait Pattern/deviations: Step-through pattern, Decreased stride length Gait velocity: reduced Gait velocity interpretation: <1.31 ft/sec, indicative of household ambulator Pre-gait activities: standing balance testing General Gait Details: tends to step R over L foot at times  Stairs            Wheelchair Mobility    Modified Rankin (Stroke Patients Only)       Balance Overall balance assessment: Needs assistance Sitting-balance support: Feet supported Sitting balance-Leahy Scale: Good     Standing balance support: Single extremity supported Standing balance-Leahy Scale: Fair Standing balance comment: dynamic balance is poor at times                             Pertinent Vitals/Pain Pain Assessment Pain Assessment: No/denies pain    Home Living Family/patient expects to be discharged to:: Private residence Living Arrangements: Spouse/significant other Available Help at Discharge: Family;Available 24 hours/day Type of Home: House Home Access: Stairs to enter Entrance Stairs-Rails: Right Entrance  Stairs-Number of Steps: 3-4   Home Layout: Two level;1/2 bath on main level Home Equipment: None      Prior Function Prior Level of Function : Independent/Modified Independent             Mobility Comments: has not used AD but reports he has struggled with walking distances for some time  now       Hand Dominance   Dominant Hand: Right    Extremity/Trunk Assessment   Upper Extremity Assessment Upper Extremity Assessment: Defer to OT evaluation    Lower Extremity Assessment Lower Extremity Assessment: LLE deficits/detail LLE Deficits / Details: L hip knee and ankle are 4- to 4+ LLE Coordination: decreased gross motor    Cervical / Trunk Assessment Cervical / Trunk Assessment: Normal  Communication   Communication: No difficulties  Cognition Arousal/Alertness: Awake/alert Behavior During Therapy: WFL for tasks assessed/performed Overall Cognitive Status: Within Functional Limits for tasks assessed                                          General Comments General comments (skin integrity, edema, etc.): pt performed berg tests with supervision and had LOB on sudden turn of his head to laterally shift to L side    Exercises     Assessment/Plan    PT Assessment Patient needs continued PT services  PT Problem List Decreased strength;Decreased activity tolerance;Decreased balance;Decreased mobility;Decreased coordination;Decreased knowledge of use of DME       PT Treatment Interventions DME instruction;Gait training;Stair training;Functional mobility training;Therapeutic activities;Therapeutic exercise;Balance training;Neuromuscular re-education;Patient/family education    PT Goals (Current goals can be found in the Care Plan section)  Acute Rehab PT Goals Patient Stated Goal: to walk safely and go home PT Goal Formulation: With patient Time For Goal Achievement: 10/12/22 Potential to Achieve Goals: Good    Frequency Min 4X/week     Co-evaluation               AM-PAC PT "6 Clicks" Mobility  Outcome Measure Help needed turning from your back to your side while in a flat bed without using bedrails?: A Little Help needed moving from lying on your back to sitting on the side of a flat bed without using bedrails?: A Little Help  needed moving to and from a bed to a chair (including a wheelchair)?: A Little Help needed standing up from a chair using your arms (e.g., wheelchair or bedside chair)?: A Little Help needed to walk in hospital room?: A Little Help needed climbing 3-5 steps with a railing? : Total 6 Click Score: 16    End of Session Equipment Utilized During Treatment: Gait belt Activity Tolerance: Treatment limited secondary to medical complications (Comment) Patient left: in bed;with call bell/phone within reach Nurse Communication: Mobility status PT Visit Diagnosis: Unsteadiness on feet (R26.81);Other abnormalities of gait and mobility (R26.89);Difficulty in walking, not elsewhere classified (R26.2);Ataxic gait (R26.0)    Time: 5320-2334 PT Time Calculation (min) (ACUTE ONLY): 17 min   Charges:   PT Evaluation $PT Eval Moderate Complexity: 1 Mod         Ramond Dial 09/28/2022, 3:30 PM  Mee Hives, PT PhD Acute Rehab Dept. Number: [place]

## 2022-09-28 NOTE — Progress Notes (Addendum)
  Progress Note   Patient: [name] QQV:[id number] DOB: 10-15-1941 DOA: 09/26/2022     1 DOS: the patient was seen and examined on 09/28/2022   Brief hospital course: 81 year old man complex PMH presenting with left arm weakness and numbness.  Admitted for suspected stroke.  Assessment and Plan: Possible right posterior frontal lobe infarct with left hand weakness, etiology: right carotid atherosclerosis  -- CT head concerning for acute infarct right posterior frontal lobe.  MRI on able to be performed secondary to old pacemaker lead.  CTA no large vessel occlusion. --Management as per neurology, continue aspirin. --Vascular surgery consultation appreciated, carotid endarterectomy being considered.   Atrial fibrillation Sick sinus syndrome --Status post pacemaker.  Implanted in 2010, exchanging 2021 with a band in original lead in place. --Continue flecainide.  Resume Xarelto after surgery.  Resume metoprolol tomorrow. Continue aspirin.   Essential hypertension --Holding home medication as able in setting of acute stroke presumed.   Hyperlipidemia CAD --Continue atorvastatin, Zetia   Diabetes mellitus type 2 controlled with hemoglobin A1c 6.1 --continue Jardiance      Subjective:  Feels ok Some left hand weakness and clumsiness, right hand dominant  Physical Exam: Vitals:   09/28/22 0400 09/28/22 0500 09/28/22 0600 09/28/22 1615  BP: (!) 123/112 (!) 120/92 (!) 132/91 120/87  Pulse: 64 (!) 59 69 60  Resp: '20 18 16 18  '$ Temp:   98 F (36.7 C) 98.7 F (37.1 C)  TempSrc:   Oral Oral  SpO2: 100% 99% 99% 99%   Physical Exam Vitals reviewed.  Constitutional:      General: He is not in acute distress.    Appearance: He is not ill-appearing or toxic-appearing.  Cardiovascular:     Rate and Rhythm: Normal rate and regular rhythm.     Heart sounds: No murmur heard. Pulmonary:     Effort: Pulmonary effort is normal. No respiratory distress.     Breath sounds: No wheezing,  rhonchi or rales.  Neurological:     Mental Status: He is alert.  Psychiatric:        Mood and Affect: Mood normal.        Behavior: Behavior normal.     Data Reviewed:  BMP, CBC noted HgbA1c 6.1  Family Communication: none  Disposition: Status is: Inpatient Remains inpatient appropriate because: stroke suspected  Planned Discharge Destination: Home    Time spent: 35 minutes  Author: Murray Hodgkins, MD 09/28/2022 5:57 PM  For on call review [email or web address].

## 2022-09-29 ENCOUNTER — Encounter (HOSPITAL_COMMUNITY): Payer: Self-pay | Admitting: Family Medicine

## 2022-09-29 DIAGNOSIS — I63231 Cerebral infarction due to unspecified occlusion or stenosis of right carotid arteries: Secondary | ICD-10-CM

## 2022-09-29 DIAGNOSIS — R41841 Cognitive communication deficit: Secondary | ICD-10-CM

## 2022-09-29 DIAGNOSIS — I272 Pulmonary hypertension, unspecified: Secondary | ICD-10-CM

## 2022-09-29 DIAGNOSIS — I639 Cerebral infarction, unspecified: Secondary | ICD-10-CM | POA: Diagnosis not present

## 2022-09-29 DIAGNOSIS — I2510 Atherosclerotic heart disease of native coronary artery without angina pectoris: Secondary | ICD-10-CM | POA: Diagnosis not present

## 2022-09-29 DIAGNOSIS — E119 Type 2 diabetes mellitus without complications: Secondary | ICD-10-CM | POA: Diagnosis not present

## 2022-09-29 HISTORY — DX: Cognitive communication deficit: R41.841

## 2022-09-29 HISTORY — DX: Pulmonary hypertension, unspecified: I27.20

## 2022-09-29 LAB — CBC
HCT: 44.7 % (ref 39.0–52.0)
Hemoglobin: 14.5 g/dL (ref 13.0–17.0)
MCH: 30.5 pg (ref 26.0–34.0)
MCHC: 32.4 g/dL (ref 30.0–36.0)
MCV: 94.1 fL (ref 80.0–100.0)
Platelets: 147 10*3/uL — ABNORMAL LOW (ref 150–400)
RBC: 4.75 MIL/uL (ref 4.22–5.81)
RDW: 13.3 % (ref 11.5–15.5)
WBC: 7.7 10*3/uL (ref 4.0–10.5)
nRBC: 0 % (ref 0.0–0.2)

## 2022-09-29 LAB — APTT
aPTT: 87 seconds — ABNORMAL HIGH (ref 24–36)
aPTT: 49 seconds — ABNORMAL HIGH (ref 24–36)

## 2022-09-29 LAB — HEPARIN LEVEL (UNFRACTIONATED): Heparin Unfractionated: 0.58 IU/mL (ref 0.30–0.70)

## 2022-09-29 NOTE — TOC Initial Note (Signed)
Transition of Care [facility]) - Initial/Assessment Note    Patient Details  Name: [name] MRN: [id number] Date of Birth: 23-Sep-1941  Transition of Care [facility]) CM/SW Contact:    Pollie Friar, RN Phone Number: 09/29/2022, 12:36 PM  Clinical Narrative:                 Pt is from home with his spouse who can provide supervision at home.  Plan for CEA this week.  Pt manages his own medications and denies any issues. Pt drives self as needed but wife can also drive.  Current recommendations are for outpatient therapy which pt prefers close to his home. TOC will follow and see what recommendations are after surgery.    Expected Discharge Plan: OP Rehab Barriers to Discharge: Continued Medical Work up   Patient Goals and CMS Choice     Choice offered to / list presented to : Patient  Expected Discharge Plan and Services Expected Discharge Plan: OP Rehab   Discharge Planning Services: CM Consult   Living arrangements for the past 2 months: Single Family Home                                      Prior Living Arrangements/Services Living arrangements for the past 2 months: Single Family Home Lives with:: Spouse Patient language and need for interpreter reviewed:: Yes Do you feel safe going back to the place where you live?: Yes        Care giver support system in place?: Yes (comment) Current home services: DME (walker and shower seat) Criminal Activity/Legal Involvement Pertinent to Current Situation/Hospitalization: No - Comment as needed  Activities of Daily Living Home Assistive Devices/Equipment: None ADL Screening (condition at time of admission) Patient's cognitive ability adequate to safely complete daily activities?: Yes Is the patient deaf or have difficulty hearing?: No Does the patient have difficulty seeing, even when wearing glasses/contacts?: No Does the patient have difficulty concentrating, remembering, or making decisions?: No Patient able to  express need for assistance with ADLs?: Yes Does the patient have difficulty dressing or bathing?: No Independently performs ADLs?: Yes (appropriate for developmental age) Does the patient have difficulty walking or climbing stairs?: No Weakness of Legs: None Weakness of Arms/Hands: Left  Permission Sought/Granted                  Emotional Assessment Appearance:: Appears stated age Attitude/Demeanor/Rapport: Engaged Affect (typically observed): Accepting Orientation: : Oriented to Self, Oriented to Place, Oriented to  Time, Oriented to Situation   Psych Involvement: No (comment)  Admission diagnosis:  Left-sided weakness [T46.5] Acute embolic stroke [facility]) [K81.2] Patient Active Problem List   Diagnosis Date Noted   Cognitive communication deficit 09/29/2022   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 ([place]) 09/29/2022   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 ([place]) 09/28/2022   Diabetes mellitus treated with oral medication ([place]) 09/28/2022   Left-sided weakness 09/26/2022   Pain and swelling of right lower leg 06/01/2022   Hypokalemia 05/20/2022   Pneumonia 05/20/2022   PAF (paroxysmal atrial fibrillation) ([place]) 05/19/2022   AKI (acute kidney injury) ([place]) 05/19/2022   Hyponatremia 05/19/2022   Hypercalcemia 05/19/2022   Obesity, Class I, BMI 30-34.9 05/19/2022   Pneumonia of left lower lobe due to infectious organism 05/18/2022   Hypersomnolence 04/15/2022   Chronic diastolic CHF (congestive heart failure) ([place]) 03/24/2022   Bilateral leg pain 11/24/2021  Left foot pain 10/29/2021   Rash 09/01/2021   Aortic atherosclerosis ([place]) 05/20/2021   Gross hematuria 12/09/2020   Chronic anticoagulation 12/09/2020   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   Vitamin D deficiency 05/07/2020   Wheezing 05/07/2020   Eustachian tube disorder, right 01/15/2020   Microhematuria 11/07/2019   Fracture of orbital floor,  blow-out, closed ([place]) 09/15/2019   Strain of lumbar region 02/10/2018   Hematuria 11/01/2017   Nausea vomiting and diarrhea 05/08/2017   Lumbar radiculopathy 12/01/2016   Dizziness 11/18/2016   Nocturia 11/18/2016   Hyperglycemia 11/18/2016   Bilateral hip pain 10/20/2016   Bilateral ankle pain 10/20/2016   Bilateral hand pain 10/20/2016   Fatigue 04/28/2016   Acute sinus infection 01/28/2016   Cough 01/28/2016   Chronic pain 07/11/2015   Alcohol abuse 07/01/2015   Increased prostate specific antigen (PSA) velocity 06/26/2014   Neck mass 12/26/2013   Arthritis of ankle or foot, degenerative 12/16/2013   Retrocalcaneal bursitis 12/16/2013   Abdominal pain 11/14/2013   Near syncope 05/06/2013   abnormal (EKG) 05/06/2013   Dry mouth 03/02/2013   Coronary artery calcification seen on CAT scan 12/26/2012   Lumbar back pain 12/26/2012   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   Leg pain, right 04/17/2012   Leg swelling 04/17/2012   DOE (dyspnea on exertion) 02/26/2012   Encounter for well adult exam with abnormal findings 12/24/2011   Cervical radiculopathy 12/24/2011   Shortness of breath 12/18/2010   SYNCOPE 12/04/2010   FREQUENCY, URINARY 11/24/2010   NECK PAIN, RIGHT 08/21/2010   Atrial fibrillation ([place]) 05/29/2010   Sick sinus syndrome ([place]) 05/20/2010   NAUSEA AND VOMITING 05/20/2010   Rhabdomyolysis 03/25/2010   SHOULDER PAIN, RIGHT 12/09/2009   ACHILLES TENDINITIS 12/09/2009   BPH (benign prostatic hyperplasia) 06/26/2009   WEIGHT LOSS-ABNORMAL 06/05/2009   NAUSEA 06/05/2009   PERSONAL HX COLONIC POLYPS 06/05/2009   BRADYCARDIA 05/27/2009   FATIGUE 05/27/2009   Hyperlipidemia 05/30/2008   Depression 05/30/2008   Essential hypertension 05/30/2008   Allergic rhinitis 05/30/2008   GERD 05/30/2008   Diverticulosis of colon (without mention of hemorrhage) 05/30/2008   OVERACTIVE BLADDER 05/30/2008   LOW BACK PAIN 05/30/2008   PCP:  Biagio Borg, MD Pharmacy:    [id number] [id number]: [phone]: [phone]    Social Determinants of Health (SDOH) Interventions    Readmission Risk Interventions     No data to display

## 2022-09-29 NOTE — Progress Notes (Signed)
ANTICOAGULATION CONSULT NOTE  Pharmacy Consult for heparin Indication: atrial fibrillation  No Known Allergies  Patient Measurements:   Last weight 08/19/22 = 204 lb Heparin Dosing Weight: 92 kg  Vital Signs: Temp: 98.6 F (37 C) (11/14 0502) Temp Source: Oral (11/14 0502) BP: 110/75 (11/14 0502) Pulse Rate: 66 (11/14 0502)  Labs: Recent Labs    09/26/22 1119 09/28/22 0441 09/29/22 0400  HGB 15.4 14.3 14.5  HCT 47.6 42.8 44.7  PLT 152 142* 147*  APTT 41*  --  87*  LABPROT 29.0*  --   --   INR 2.8*  --   --   HEPARINUNFRC  --   --  0.58  CREATININE 0.98 1.15  --      CrCl cannot be calculated (Unknown ideal weight.).   Medical History: Past Medical History:  Diagnosis Date   Abdominal pain, epigastric    Allergic rhinitis, cause unspecified    Arthritis    BPH (benign prostatic hyperplasia)    CHF (congestive heart failure) (HCC)    Chronic anticoagulation    xarelto   Coronary artery calcification seen on CAT scan 12/26/2012   Depressive disorder, not elsewhere classified    Diabetes mellitus treated with oral medication ([place]) 09/28/2022   Diverticulosis of colon (without mention of hemorrhage)    Esophageal reflux    High risk medication use    Hyperlipidemia    Hyperplastic rectal polyp    Hypertension    Lumbago    Pacemaker    Paralyzed hemidiaphragm    LEFT   Persistent atrial fibrillation (HCC)    Sick sinus syndrome ([place]) 05/16/2009   s/p PPM ([employer]) by [name]      Assessment: 81 yo M on rivaroxaban PTA for afib now admitted with CVA pending CEA procedure 11/14. Last dose 11/10 at 20:00. Pharmacy consulted for heparin. CBC stable, platelets 147. No issues with infusion or bleeding reported. aPTT slightly above goal at 87.  Goal of Therapy:  Heparin level 0.3 -0.5 units/ml aPTT 66- 85 sec Monitor platelets by anticoagulation protocol: Yes   Plan:  Reduce heparin infusion to 1200 units/hr Check heparin level in 8 hours and  daily while on heparin Continue to monitor H&H and platelets  F/u restart rivaroxaban post-CEA     Thank you for allowing pharmacy to be a part of this patient's care.  Ardyth Harps, PharmD Clinical Pharmacist

## 2022-09-29 NOTE — Plan of Care (Signed)
  Problem: Education: Goal: Knowledge of disease or condition will improve Outcome: Progressing Goal: Knowledge of secondary prevention will improve (MUST DOCUMENT ALL) Outcome: Progressing Goal: Knowledge of patient specific risk factors will improve [place] N/A or DELETE if not current risk factor) Outcome: Progressing   Problem: Ischemic Stroke/TIA Tissue Perfusion: Goal: Complications of ischemic stroke/TIA will be minimized Outcome: Progressing   Problem: Nutrition: Goal: Adequate nutrition will be maintained Outcome: Progressing   Problem: Safety: Goal: Ability to remain free from injury will improve Outcome: Progressing

## 2022-09-29 NOTE — Progress Notes (Signed)
Mobility Specialist: Progress Note   09/29/22 1640  Mobility  Activity Ambulated with assistance in hallway  Level of Assistance Contact guard assist, steadying assist  Assistive Device Other (Comment) (IV pole)  Distance Ambulated (ft) 350 ft  Activity Response Tolerated well  Mobility Referral Yes  $Mobility charge 1 Mobility   Post-Mobility: 81 HR, 98% SpO2  Received pt in bed having no complaints and agreeable to mobility. Pt was asymptomatic throughout ambulation and returned to room w/o fault. Left in bed w/ call bell in reach and all needs met.  [name] Mobility Specialist Please contact via SecureChat or Rehab office at [phone]

## 2022-09-29 NOTE — Progress Notes (Signed)
Physical Therapy Treatment Patient Details Name: [name] MRN: [id number] DOB: [birth year] Today's Date: 09/29/2022   History of Present Illness Pt is a 81 y/o male who presented with L sided weakness/numbness. CT head negative, pending MRI brain (pending pacemaker compatibility). PMH: CAD, SSS s/p PPM, HLD, depression, HTN, a fib, COPD, GERD, BPH, CHF    PT Comments    Patient progressing with mobility working on balance and walking without device.  Still slightly unsteady, but able to perform some dynamic gait tasks with minguard to S level assist.  Patient feels he is tolerating walking distances better than prior as he would have to sit down due to SOB.  PT will continue to follow.  Recommendations for outpatient PT remain appropriate.    Recommendations for follow up therapy are one component of a multi-disciplinary discharge planning process, led by the attending physician.  Recommendations may be updated based on patient status, additional functional criteria and insurance authorization.  Follow Up Recommendations  Outpatient PT     Assistance Recommended at Discharge    Patient can return home with the following A lot of help with walking and/or transfers;A little help with bathing/dressing/bathroom;Assistance with cooking/housework;Assist for transportation;Help with stairs or ramp for entrance   Equipment Recommendations  Rolling walker (2 wheels)    Recommendations for Other Services       Precautions / Restrictions Precautions Precautions: Fall     Mobility  Bed Mobility   Bed Mobility: Supine to Sit     Supine to sit: Supervision     General bed mobility comments: increased time, use of rail    Transfers Overall transfer level: Needs assistance Equipment used: None Transfers: Sit to/from Stand Sit to Stand: Supervision           General transfer comment: assist for balance    Ambulation/Gait Ambulation/Gait assistance: Supervision, Min  guard Gait Distance (Feet): 175 Feet Assistive device: IV Pole, None Gait Pattern/deviations: Step-through pattern, Decreased stride length, Drifts right/left       General Gait Details: initially pusing IV pole, then without device one episode of LOB backing out of bathroom with min A recovery, but able to stop in hallway and walk backwards a few steps and stop without LOB, with head turns veers to sides minimally, no LOB with head nods and can weave around markings in floor without LOB   Stairs Stairs: Yes Stairs assistance: Supervision Stair Management: Alternating pattern, Two rails, Forwards Number of Stairs: 2 General stair comments: slow but no LOB   Wheelchair Mobility    Modified Rankin (Stroke Patients Only) Modified Rankin (Stroke Patients Only) Pre-Morbid Rankin Score: Slight disability Modified Rankin: Moderately severe disability     Balance Overall balance assessment: Needs assistance   Sitting balance-Leahy Scale: Good     Standing balance support: No upper extremity supported Standing balance-Leahy Scale: Good Standing balance comment: see DGI               High Level Balance Comments: In room performed anterior/posterior rocking with UE support for LOS and cues for up on toes and back on heels x 5, side stepping without UE support x 5 Standardized Balance Assessment Standardized Balance Assessment : Dynamic Gait Index   Dynamic Gait Index Level Surface: Mild Impairment Gait with Horizontal Head Turns: Moderate Impairment Gait with Vertical Head Turns: Normal Gait and Pivot Turn: Mild Impairment Step Around Obstacles: Normal Steps: Mild Impairment      Cognition Arousal/Alertness: Awake/alert Behavior During Therapy: [facility] for  tasks assessed/performed Overall Cognitive Status: Within Functional Limits for tasks assessed                                          Exercises      General Comments General comments (skin  integrity, edema, etc.): Performed part of DGI and pt with deficits with head turns, with steps and slower with turn and stop      Pertinent Vitals/Pain      Home Living                          Prior Function            PT Goals (current goals can now be found in the care plan section) Progress towards PT goals: Progressing toward goals    Frequency    Min 4X/week      PT Plan Current plan remains appropriate    Co-evaluation              AM-PAC PT "6 Clicks" Mobility   Outcome Measure  Help needed turning from your back to your side while in a flat bed without using bedrails?: None Help needed moving from lying on your back to sitting on the side of a flat bed without using bedrails?: None Help needed moving to and from a bed to a chair (including a wheelchair)?: A Little Help needed standing up from a chair using your arms (e.g., wheelchair or bedside chair)?: None Help needed to walk in hospital room?: A Little Help needed climbing 3-5 steps with a railing? : A Little 6 Click Score: 21    End of Session Equipment Utilized During Treatment: Gait belt Activity Tolerance: Patient tolerated treatment well Patient left: in chair;with call bell/phone within reach   PT Visit Diagnosis: Other abnormalities of gait and mobility (R26.89)     Time: 5697-9480 PT Time Calculation (min) (ACUTE ONLY): 24 min  Charges:  $Gait Training: 8-22 mins $Neuromuscular Re-education: 8-22 mins                     Magda Kiel, PT Acute Rehabilitation Services Office:[phone] 09/29/2022    Reginia Naas 09/29/2022, 1:47 PM

## 2022-09-29 NOTE — Progress Notes (Addendum)
  Progress Note    09/29/2022 7:35 AM * No surgery found *  Subjective:  no complaints. Sitting up on side of bed eating breakfast. Says left hand is still not back to normal. Some numbness in finger tips persists and hand feels weak   Vitals:   09/28/22 2359 09/29/22 0502  BP: 110/81 110/75  Pulse: 63 66  Resp: 16 19  Temp: 98.3 F (36.8 C) 98.6 F (37 C)  SpO2: 97% 100%   Physical Exam: Cardiac:  regular Lungs:  non labored Extremities:  moving all extremities without deficits. Grip strength 4/5 in left hand Neurologic: Alert and oriented. Speech coherent. Smile symmetric  CBC    Component Value Date/Time   WBC 7.7 09/29/2022 0400   RBC 4.75 09/29/2022 0400   HGB 14.5 09/29/2022 0400   HGB 14.9 09/03/2022 1229   HCT 44.7 09/29/2022 0400   HCT 43.9 09/03/2022 1229   PLT 147 (L) 09/29/2022 0400   PLT 159 09/03/2022 1229   MCV 94.1 09/29/2022 0400   MCV 91 09/03/2022 1229   MCH 30.5 09/29/2022 0400   MCHC 32.4 09/29/2022 0400   RDW 13.3 09/29/2022 0400   RDW 13.9 09/03/2022 1229   LYMPHSABS 1.6 09/26/2022 1119   LYMPHSABS 2.1 09/18/2020 1549   MONOABS 0.8 09/26/2022 1119   EOSABS 0.0 09/26/2022 1119   EOSABS 0.1 09/18/2020 1549   BASOSABS 0.0 09/26/2022 1119   BASOSABS 0.1 09/18/2020 1549    BMET    Component Value Date/Time   NA 142 09/28/2022 0441   NA 140 09/03/2022 1229   K 3.9 09/28/2022 0441   CL 110 09/28/2022 0441   CO2 23 09/28/2022 0441   GLUCOSE 105 (H) 09/28/2022 0441   GLUCOSE 86 10/25/2006 0935   BUN 15 09/28/2022 0441   BUN 16 09/03/2022 1229   CREATININE 1.15 09/28/2022 0441   CREATININE 1.03 12/11/2015 0912   CALCIUM 9.1 09/28/2022 0441   GFRNONAA >60 09/28/2022 0441   GFRAA 89 09/18/2020 1549    INR    Component Value Date/Time   INR 2.8 (H) 09/26/2022 1119     Intake/Output Summary (Last 24 hours) at 09/29/2022 0735 Last data filed at 09/29/2022 1941 Gross per 24 hour  Intake 367.06 ml  Output 450 ml  Net -82.94 ml      Assessment/Plan:  81 y.o. 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 Donzetta Matters will discuss with patient timing of surgery  DVT prophylaxis:  Heparin IV   Karoline Caldwell, PA-C [facility] [phone] 09/29/2022 7:35 AM  I have independently interviewed and examined patient and agree with PA assessment and plan above. Plan for R CEA on 11/16 with Dr. Stanford Breed. I have communicated plan to patient. Continue IV heparin.  Germaine Shenker C. Donzetta Matters, MD [facility] [place] Office: [phone] Pager: [phone]

## 2022-09-29 NOTE — Progress Notes (Signed)
  Progress Note   Patient: ANITA MCADORY YTK:[id number] DOB: [birth year] DOA: 09/26/2022     2 DOS: the patient was seen and examined on 09/29/2022   Brief hospital course: 81 year old man complex PMH presenting with left arm weakness and numbness.  Admitted for stroke secondary to symptomatic right ICA stenosis.  Plan for carotid endarterectomy 11/16.  Assessment and Plan: Right posterior frontal lobe infarct with left hand weakness, etiology: symptomatic right carotid atherosclerosis  -- CT head concerning for acute infarct right posterior frontal lobe. MRI un able to be performed secondary to old pacemaker lead.  CTA no large vessel occlusion. --Management as per neurology, continue aspirin, statin, Zetia, hold off Xarelto for now. Heparin IV per stroke protocol in preparation for surgery. Marland Kitchen --Vascular surgery consultation appreciated, carotid endarterectomy planned 11/16.  Cognitive communication deficit (R41.841)   Acute inpatient rehab recommended   Atrial fibrillation Sick sinus syndrome Cardiologist:  Lenna Sciara, MD  Status post pacemaker.  Implanted in 2010, exchanging 2021 with a band in original lead in place. Stable. Continue flecainide.  Resume Xarelto after surgery.  Restart metoprolo 11/15. Continue aspirin.  Hyperlipidemia Coronary artery calcification seen on CAT scan  Chronic diastolic CHF Aortic atherosclerosis CT FFR was negative in 2022.  Continue aspirin and statin. Continue atorvastatin, Zetia. No recent chest pain. Proceed with surgery.  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 Recommendation at that time was to continue current therapy   Chronic obstructive pulmonary disease, unspecified COPD type ([place])     Diabetes mellitus type 2 controlled with hemoglobin  A1c 6.1 continue Jardiance    Subjective:  Left hand strength improved, but still difficult to hold things  Physical Exam: Vitals:   09/29/22 0843 09/29/22 1118 09/29/22 1608 09/29/22 1700  BP: 98/74 (!) 108/94 132/85   Pulse: 73 67 74   Resp: '17 17 17   '$ Temp: 98.3 F (36.8 C) 98.7 F (37.1 C) 97.8 F (36.6 C)   TempSrc: Oral Oral Oral   SpO2: 99% 100% 98%   Weight:    92.5 kg   Physical Exam Vitals reviewed.  Constitutional:      General: He is not in acute distress.    Appearance: He is not ill-appearing or toxic-appearing.  Cardiovascular:     Rate and Rhythm: Regular rhythm.     Heart sounds: No murmur heard. Pulmonary:     Effort: Pulmonary effort is normal. No respiratory distress.     Breath sounds: No wheezing, rhonchi or rales.  Neurological:     Mental Status: He is alert.     Cranial Nerves: No cranial nerve deficit.     Coordination: Coordination abnormal (LUE).     Comments: Left hand and arm weakness noted  Psychiatric:        Mood and Affect: Mood normal.        Behavior: Behavior normal.     Data Reviewed: Plts 142  Family Communication: none  Disposition: Status is: Inpatient Remains inpatient appropriate because: stroke  Planned Discharge Destination: Home    Time spent: 20 minutes  Author: Murray Hodgkins, MD 09/29/2022 5:36 PM  For on call review [email or web address].

## 2022-09-29 NOTE — Progress Notes (Signed)
ANTICOAGULATION CONSULT NOTE  Pharmacy Consult for heparin Indication: atrial fibrillation  No Known Allergies  Patient Measurements: Weight: 92.5 kg (203 lb 14.8 oz) Last weight 08/19/22 = 204 lb Heparin Dosing Weight: 92 kg  Vital Signs: Temp: 97.8 F (36.6 C) (11/14 1608) Temp Source: Oral (11/14 1608) BP: 132/85 (11/14 1608) Pulse Rate: 74 (11/14 1608)  Labs: Recent Labs    09/28/22 0441 09/29/22 0400 09/29/22 1624  HGB 14.3 14.5  --   HCT 42.8 44.7  --   PLT 142* 147*  --   APTT  --  87* 49*  HEPARINUNFRC  --  0.58  --   CREATININE 1.15  --   --      Estimated Creatinine Clearance: 58.5 mL/min (by C-G formula based on SCr of 1.15 mg/dL).   Medical History: Past Medical History:  Diagnosis Date   Abdominal pain, epigastric    Allergic rhinitis, cause unspecified    Arthritis    BPH (benign prostatic hyperplasia)    CHF (congestive heart failure) (HCC)    Chronic anticoagulation    xarelto   Cognitive communication deficit 09/29/2022   Coronary artery calcification seen on CAT scan 12/26/2012   Depressive disorder, not elsewhere classified    Diabetes mellitus treated with oral medication ([place]) 09/28/2022   Diverticulosis of colon (without mention of hemorrhage)    Esophageal reflux    High risk medication use    Hyperlipidemia    Hyperplastic rectal polyp    Hypertension    Lumbago    Pacemaker    Paralyzed hemidiaphragm    LEFT   Persistent atrial fibrillation ([place])    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 ([place]) 09/29/2022   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 Last  dose 11/10 at 20:00. Pharmacy consulted for heparin. CBC stable, platelets 147. No issues with infusion or bleeding reported. aPTT slightly above goal at 87.  APTT came back subtherapeutic this PM. We will increase rate and check another level in AM.  Goal of Therapy:  Heparin level 0.3 -0.5 units/ml aPTT 66- 85 sec Monitor platelets by anticoagulation protocol: Yes   Plan:  Increase heparin infusion to 1300 units/hr Check heparin level in 8 hours and daily while on heparin Continue to monitor H&H and platelets  F/u restart rivaroxaban post-CEA     Onnie Boer, PharmD, BCIDP, AAHIVP, CPP Infectious Disease Pharmacist 09/29/2022 5:28 PM

## 2022-09-29 NOTE — Progress Notes (Signed)
STROKE TEAM PROGRESS NOTE   INTERVAL HISTORY No family at the bedside. Pt sitting in bed, no acute event overnight, neuro stable. VVS plan for right CEA soon. On heparin IV and ASA 81 now.    Vitals:   09/28/22 2031 09/28/22 2359 09/29/22 0502 09/29/22 0843  BP: (!) 119/90 110/81 110/75 98/74  Pulse: 72 63 66 73  Resp: '20 16 19 17  '$ Temp: 98.6 F (37 C) 98.3 F (36.8 C) 98.6 F (37 C) 98.3 F (36.8 C)  TempSrc: Oral Oral Oral Oral  SpO2: 98% 97% 100% 99%   CBC:  Recent Labs  Lab 09/26/22 1119 09/28/22 0441 09/29/22 0400  WBC 7.7 8.1 7.7  NEUTROABS 5.2  --   --   HGB 15.4 14.3 14.5  HCT 47.6 42.8 44.7  MCV 94.1 93.0 94.1  PLT 152 142* 076*   Basic Metabolic Panel:  Recent Labs  Lab 09/26/22 1119 09/28/22 0441  NA 142 142  K 3.9 3.9  CL 106 110  CO2 26 23  GLUCOSE 108* 105*  BUN 16 15  CREATININE 0.98 1.15  CALCIUM 9.6 9.1   Lipid Panel:  Recent Labs  Lab 09/28/22 0441  CHOL 110  TRIG 34  HDL 48  CHOLHDL 2.3  VLDL 7  LDLCALC 55   HgbA1c:  Recent Labs  Lab 09/28/22 0441  HGBA1C 6.1*   Urine Drug Screen: No results for input(s): "LABOPIA", "COCAINSCRNUR", "LABBENZ", "AMPHETMU", "THCU", "LABBARB" in the last 168 hours.  Alcohol Level  Recent Labs  Lab 09/26/22 1119  ETH <10    IMAGING past 24 hours No results found.  PHYSICAL EXAM GENERAL: Awake, alert, in no acute distress LUNGS: Normal respiratory effort. Non-labored breathing on room air CV: Regular rate and rhythm on telemetry  NEURO:  Mental Status: Awake, alert, and oriented to person, place, time, and situation. He/She is able to provide a clear and coherent history of present illness. Speech/Language: speech is clear and fluent.   Naming, repetition, fluency, and comprehension intact without aphasia  No neglect is noted Cranial Nerves:  II: PERRL visual fields full.  III, IV, VI: EOMI. Lid elevation symmetric and full.  V: Sensation is intact to light touch and symmetrical to  face.  VII: Face is symmetric resting and smiling.  VIII: Hearing intact to voice IX, X: Palate elevation is symmetric. Phonation normal.  XI: Normal sternocleidomastoid and trapezius muscle strength XII: Tongue protrudes midline without fasciculations.   Motor: 5/5 strength to right upper extremity and bilateral lower extremities, 4+/5 strength to left upper extremity Sensation: Intact to light touch bilaterally in all four extremities but diminished in left upper extremity. No extinction to DSS present.  Gait: Deferred  ASSESSMENT/PLAN [name] is a 81 y.o. male with history of  CAD, sick sinus syndrome with pacemaker, hyperlipidemia, hypertension, depression, atrial fibrillation on Xarelto, COPD, GERD, BPH and CHF who presents with left arm weakness and numbness which began Saturday morning. He resumed his xarelto 10/26 after having a cardiac catheterization the day before.  VVS plans to do a right CEA later this week after completion of stroke work up.   Stroke:  right high posterior frontal lobe infarct, likely large vessel disease from right carotid stenosis Code Stroke CT head No acute abnormality.   Repeat Head CT 24h after - Hypoattenuation and loss of gray differentiation in the high right posterior frontal lobe, suspicious for acute infarct CTA head & neck Approximately 50% stenosis of the right ICA origin  in neck. Chronic widening of the atlantodental interval (up to 6 mm), suggestive of instability/laxity.   Not able to have MRI due to incompatible pacer leads 2D Echo EF 55% in 07/2022 LDL 55 HgbA1c 6.1 VTE prophylaxis - SCDs Xarelto (rivaroxaban) daily prior to admission, now on heparin IV and ASA 81.  Therapy recommendations:  No follow up Disposition:  Pending  Right carotid stenosis CTA head & neck Approximately 50% stenosis of the right ICA origin in neck. VVS Dr Donzetta Matters on board Planing for right CEA soon On heparin IV and ASA in preparation of CEA  Atrial  fibrillation Home meds: flecainide, xarelto Currently on ASA 325->81 Hold off Xarelto now and continue heparin IV in preparation of left CEA  Hypertension Home meds:  amlodipine  Stable BP goal 130-160 before left CEA Long-term BP goal normotensive  Hyperlipidemia Home meds:  atorvastatin '80mg'$ , zetia '10mg'$  resumed in hospital LDL 55, goal < 70 Continue statin at discharge  Diabetes type II Controlled Home meds:  jardiance  HgbA1c 6.1, goal < 7.0 CBGs SSI PCP close follow up  Other Stroke Risk Factors Advanced Age >/= 65  Coronary artery disease Heart cath done by Dr. Ali Lowe on 10/25 due to elevated PA pressures with instructions to continue current therapy.  1.  Mild pulmonary hypertension with a mean PA pressure of 28 mmHg. 2.  Mean RA pressure of 8 mmHg, RV pressure of 43/6 with RV end-diastolic pressure of 12 mmHg, mean wedge pressure of 7 mmHg, and PA pressure 45/24 with a mean of 28 mmHg. 3.  Fick cardiac output of 4.28 L/min and Fick cardiac index of 2.05 L/min/m. 4.  PVR of 4.75 Woods units. 5.  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  Hospital day # 2  Neurology will sign off. Please call with questions. Pt will follow up with Dr. Berdine Addison at [facility] in about 4 weeks. Thanks for the consult.   Rosalin Hawking, MD PhD Stroke Neurology 09/29/2022 10:34 AM     To contact Stroke Continuity provider, please refer to [email or web address] After hours, contact General Neurology

## 2022-09-30 ENCOUNTER — Other Ambulatory Visit: Payer: Medicare Other

## 2022-09-30 DIAGNOSIS — I700 Atherosclerosis of aorta: Secondary | ICD-10-CM | POA: Diagnosis not present

## 2022-09-30 DIAGNOSIS — I5032 Chronic diastolic (congestive) heart failure: Secondary | ICD-10-CM

## 2022-09-30 DIAGNOSIS — N400 Enlarged prostate without lower urinary tract symptoms: Secondary | ICD-10-CM | POA: Diagnosis not present

## 2022-09-30 DIAGNOSIS — I63231 Cerebral infarction due to unspecified occlusion or stenosis of right carotid arteries: Secondary | ICD-10-CM | POA: Diagnosis not present

## 2022-09-30 DIAGNOSIS — I480 Paroxysmal atrial fibrillation: Secondary | ICD-10-CM | POA: Diagnosis not present

## 2022-09-30 LAB — APTT
aPTT: 176 seconds (ref 24–36)
aPTT: 115 seconds — ABNORMAL HIGH (ref 24–36)

## 2022-09-30 LAB — HEPARIN LEVEL (UNFRACTIONATED)
Heparin Unfractionated: 0.86 IU/mL — ABNORMAL HIGH (ref 0.30–0.70)
Heparin Unfractionated: 0.59 IU/mL (ref 0.30–0.70)

## 2022-09-30 LAB — CBC
HCT: 42.9 % (ref 39.0–52.0)
Hemoglobin: 14.2 g/dL (ref 13.0–17.0)
MCH: 30.7 pg (ref 26.0–34.0)
MCHC: 33.1 g/dL (ref 30.0–36.0)
MCV: 92.9 fL (ref 80.0–100.0)
Platelets: 136 10*3/uL — ABNORMAL LOW (ref 150–400)
RBC: 4.62 MIL/uL (ref 4.22–5.81)
RDW: 13.3 % (ref 11.5–15.5)
WBC: 8.9 10*3/uL (ref 4.0–10.5)
nRBC: 0 % (ref 0.0–0.2)

## 2022-09-30 NOTE — Plan of Care (Signed)
  Problem: Education: Goal: Understanding of CV disease, CV risk reduction, and recovery process will improve Outcome: Progressing

## 2022-09-30 NOTE — Progress Notes (Signed)
ANTICOAGULATION CONSULT NOTE - Follow Up Consult  Pharmacy Consult for heparin Indication: atrial fibrillation and stroke  Labs: Recent Labs    09/28/22 0441 09/29/22 0400 09/29/22 1624 09/30/22 0312  HGB 14.3 14.5  --  14.2  HCT 42.8 44.7  --  42.9  PLT 142* 147*  --  136*  APTT  --  87* 49* 176*  HEPARINUNFRC  --  0.58  --  0.86*  CREATININE 1.15  --   --   --     Assessment: 81yo male supratherapeutic on heparin after rate change, would expect PTT to be lower w/ heparin level at 0.86; no infusion issues or signs of bleeding per RN.  Goal of Therapy:  Heparin level 0.3-0.5 units/ml aPTT 66-85 seconds   Plan:  Will decrease heparin infusion by 3 units/kg/hr to 1000 units/hr and check labs in 8 hours.    Wynona Neat, PharmD, BCPS  09/30/2022,6:01 AM

## 2022-09-30 NOTE — Progress Notes (Signed)
PROGRESS NOTE    [name]  HYQ:[id number] DOB: [birth year] DOA: 09/26/2022 PCP: Biagio Borg, MD   Brief Narrative:  81 y.o. male with medical history significant of CAD, sick sinus syndrome status post pacemaker, hyperlipidemia, depression, hypertension, atrial fibrillation, COPD, obesity, GERD, BPH, alcohol use, CHF presented with left arm weakness and numbness.  He was found to have acute stroke secondary to symptomatic right ICA stenosis.  Neurology and vascular surgery consulted.  Vascular surgery planning for right carotid endarterectomy on 10/01/2021.  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 MRI un able to be performed secondary to old pacemaker lead.  CTA no large vessel occlusion. -Management as per neurology: continue aspirin, statin, Zetia, hold off Xarelto for now. Heparin IV per stroke protocol in preparation for surgery.  Outpatient follow-up with neurology.  Neurology signed off on 09/29/2022 -Vascular surgery planning for carotid endarterectomy on 10/01/2022 -PT recommended outpatient PT -Diet as per SLP recommendations -Echo showed EF of 55% in 9/223.  LDL 55, A1c 6.1.  Paroxysmal A-fib History of sick sinus syndrome status post pacemaker Chronic diastolic heart failure Coronary artery calcification seen on CAT scan Aortic atherosclerosis Hyperlipidemia -Currently rate controlled.  No chest pain.  Continue aspirin, statin, ezetimibe, flecainide.  Currently on heparin drip.  Resume Xarelto once cleared by vascular surgery.  Outpatient follow-up with cardiology  Hypertension -Blood pressure currently stable -Resume antihypertensives after permissive hypertension  WHO class III pulmonary hypertension -Due to chronic lung disease specially given the history of COPD and left  diaphragmatic paralysis diagnosed with RHC in 08/2022  COPD -Unspecified.  Currently stable.  Outpatient follow-up  BPH -Continue tamsulosin  Diabetes mellitus type 2 controlled -Continue Jardiance.  A1c 6.1  Obesity -Outpatient follow-up    DVT prophylaxis: Heparin drip Code Status: Full Family Communication: None at bedside Disposition Plan: Status is: Inpatient Remains inpatient appropriate because: Of need for vascular surgery intervention   Consultants: Neurology/vascular surgery  Procedures: Echo  Antimicrobials: None   Subjective: Patient seen and examined at bedside.  Denies fever, vomiting, worsening shortness of breath or chest pain.  Objective: Vitals:   09/29/22 2039 09/30/22 0010 09/30/22 0413 09/30/22 0809  BP: 124/85 124/89 115/84 128/83  Pulse: 83 67 73 65  Resp: '20 18 18 16  '$ Temp: 98.7 F (37.1 C) 98.3 F (36.8 C) 98.8 F (37.1 C) 98.9 F (37.2 C)  TempSrc: Oral Oral Oral Oral  SpO2: 97% 97% 98% 98%  Weight:        Intake/Output Summary (Last 24 hours) at 09/30/2022 1105 Last data filed at 09/30/2022 0900 Gross per 24 hour  Intake 1526.2 ml  Output 980 ml  Net 546.2 ml   Filed Weights   09/29/22 1700  Weight: 92.5 kg    Examination:  General exam: Appears calm and comfortable.  On room air.  No distress. Respiratory system: Bilateral decreased breath sounds at bases with some scattered crackles Cardiovascular system: S1 & S2 heard, Rate controlled Gastrointestinal system: Abdomen is nondistended, soft and nontender. Normal bowel sounds heard. Extremities: No cyanosis, clubbing; trace lower extremity edema   Data Reviewed: I have personally reviewed following labs and imaging studies  CBC: Recent Labs  Lab 09/26/22 1119 09/28/22 0441 09/29/22 0400 09/30/22 0312  WBC 7.7 8.1 7.7 8.9  NEUTROABS 5.2  --   --   --   HGB  15.4 14.3 14.5 14.2  HCT 47.6 42.8 44.7 42.9  MCV 94.1 93.0 94.1 92.9  PLT 152 142* 147* 136*    Basic Metabolic Panel: Recent Labs  Lab 09/26/22 1119 09/28/22 0441  NA 142 142  K 3.9 3.9  CL 106 110  CO2 26 23  GLUCOSE 108* 105*  BUN 16 15  CREATININE 0.98 1.15  CALCIUM 9.6 9.1   GFR: Estimated Creatinine Clearance: 58.5 mL/min (by C-G formula based on SCr of 1.15 mg/dL). Liver Function Tests: Recent Labs  Lab 09/26/22 1119  AST 20  ALT 26  ALKPHOS 67  BILITOT 1.0  PROT 7.5  ALBUMIN 4.4   No results for input(s): "LIPASE", "AMYLASE" in the last 168 hours. No results for input(s): "AMMONIA" in the last 168 hours. Coagulation Profile: Recent Labs  Lab 09/26/22 1119  INR 2.8*   Cardiac Enzymes: No results for input(s): "CKTOTAL", "CKMB", "CKMBINDEX", "TROPONINI" in the last 168 hours. BNP (last 3 results) Recent Labs    05/13/22 1216 06/01/22 1004  PROBNP 19.0 19.0   HbA1C: Recent Labs    09/28/22 0441  HGBA1C 6.1*   CBG: Recent Labs  Lab 09/26/22 1109 09/27/22 1148  GLUCAP 87 163*   Lipid Profile: Recent Labs    09/28/22 0441  CHOL 110  HDL 48  LDLCALC 55  TRIG 34  CHOLHDL 2.3   Thyroid Function Tests: No results for input(s): "TSH", "T4TOTAL", "FREET4", "T3FREE", "THYROIDAB" in the last 72 hours. Anemia Panel: No results for input(s): "VITAMINB12", "FOLATE", "FERRITIN", "TIBC", "IRON", "RETICCTPCT" in the last 72 hours. Sepsis Labs: No results for input(s): "PROCALCITON", "LATICACIDVEN" in the last 168 hours.  No results found for this or any previous visit (from the past 240 hour(s)).       Radiology Studies: No results found.      Scheduled Meds:  aspirin EC  81 mg Oral Daily   atorvastatin  80 mg Oral Daily   empagliflozin  10 mg Oral QAC breakfast   ezetimibe  10 mg Oral Daily   flecainide  100 mg Oral BID   loratadine  10 mg Oral Daily   pantoprazole  40 mg Oral Daily   sodium chloride flush  3 mL Intravenous Once   tamsulosin  0.4 mg Oral BID   Continuous Infusions:  heparin 1,000 Units/hr (09/30/22  0604)          Aline August, MD [facility] 09/30/2022, 11:05 AM

## 2022-09-30 NOTE — Progress Notes (Signed)
Physical Therapy Treatment Patient Details Name: [name] MRN: [id number] DOB: 1941/08/31 Today's Date: 09/30/2022   History of Present Illness Pt is a 81 y/o male who presented with L sided weakness/numbness. CT head negative, pending MRI brain (pending pacemaker compatibility). PMH: CAD, SSS s/p PPM, HLD, depression, HTN, a fib, COPD, GERD, BPH, CHF    PT Comments    Pt with good progress this session with focus on dynamic balance tasks and gait training without AD. Pt demonstrating most difficulty with single leg tasks on LLE, with lateral LOB during SLS with pt able to self correct in all instances with stepping strategy. Pt grossly supervision throughout gait training with increased gait speed noted this session and pt endorsing increased tolerance vs PTA. Pt continues to benefit from skilled PT services to progress toward functional mobility goals.     Recommendations for follow up therapy are one component of a multi-disciplinary discharge planning process, led by the attending physician.  Recommendations may be updated based on patient status, additional functional criteria and insurance authorization.  Follow Up Recommendations  Outpatient PT     Assistance Recommended at Discharge Frequent or constant Supervision/Assistance  Patient can return home with the following A lot of help with walking and/or transfers;A little help with bathing/dressing/bathroom;Assistance with cooking/housework;Assist for transportation;Help with stairs or ramp for entrance   Equipment Recommendations  Rolling walker (2 wheels)    Recommendations for Other Services       Precautions / Restrictions Precautions Precautions: Fall Restrictions Weight Bearing Restrictions: No     Mobility  Bed Mobility Overal bed mobility: Modified Independent             General bed mobility comments: OOB in recliner pre and post session    Transfers Overall transfer level: Independent Equipment  used: None Transfers: Sit to/from Stand, Bed to chair/wheelchair/BSC Sit to Stand: Independent   Step pivot transfers: Independent            Ambulation/Gait Ambulation/Gait assistance: Supervision, Min guard Gait Distance (Feet): 250 Feet Assistive device: None Gait Pattern/deviations: Step-through pattern, Decreased stride length, Drifts right/left Gait velocity: reduced     General Gait Details: min gaurd during dynamic balance tasks, otherwise supervision without AD   Stairs             Wheelchair Mobility    Modified Rankin (Stroke Patients Only)       Balance Overall balance assessment: Needs assistance Sitting-balance support: Feet supported Sitting balance-Leahy Scale: Good     Standing balance support: No upper extremity supported Standing balance-Leahy Scale: Good   Single Leg Stance - Right Leg: 30 Single Leg Stance - Left Leg: 3     Rhomberg - Eyes Opened: 60 Rhomberg - Eyes Closed: 30 High level balance activites: Side stepping, Backward walking High Level Balance Comments: able to maintain feet together eyes closed for >30 seconds on second trial. no LOB with retro stepping and side stepping in hall for 25' each, most difficulty with SLS on L with pt unable to maintain for more than a couple seconds for x3 trials            Cognition Arousal/Alertness: Awake/alert Behavior During Therapy: WFL for tasks assessed/performed Overall Cognitive Status: Within Functional Limits for tasks assessed  Exercises Other Exercises Other Exercises: step taps to 6' step with single UE support x10 ea side, no LOB    General Comments        Pertinent Vitals/Pain Pain Assessment Pain Assessment: No/denies pain Faces Pain Scale: No hurt    Home Living                          Prior Function            PT Goals (current goals can now be found in the care plan section)  Acute Rehab PT Goals PT Goal Formulation: With patient Time For Goal Achievement: 10/12/22 Progress towards PT goals: Progressing toward goals    Frequency    Min 4X/week      PT Plan Current plan remains appropriate    Co-evaluation              AM-PAC PT "6 Clicks" Mobility   Outcome Measure  Help needed turning from your back to your side while in a flat bed without using bedrails?: None Help needed moving from lying on your back to sitting on the side of a flat bed without using bedrails?: None Help needed moving to and from a bed to a chair (including a wheelchair)?: A Little Help needed standing up from a chair using your arms (e.g., wheelchair or bedside chair)?: None Help needed to walk in hospital room?: A Little Help needed climbing 3-5 steps with a railing? : A Little 6 Click Score: 21    End of Session Equipment Utilized During Treatment: Gait belt Activity Tolerance: Patient tolerated treatment well Patient left: in chair;with call bell/phone within reach Nurse Communication: Mobility status PT Visit Diagnosis: Other abnormalities of gait and mobility (R26.89)     Time: 9767-3419 PT Time Calculation (min) (ACUTE ONLY): 19 min  Charges:  $Therapeutic Activity: 8-22 mins                    Huda Petrey R. PTA Acute Rehabilitation Services Office: [place] 09/30/2022, 4:29 PM

## 2022-09-30 NOTE — Care Management Important Message (Signed)
Important Message  Patient Details  Name: [name] MRN: [id number] Date of Birth: 04/09/1941   Medicare Important Message Given:  Yes     Orbie Pyo 09/30/2022, 2:29 PM

## 2022-09-30 NOTE — Progress Notes (Signed)
ANTICOAGULATION CONSULT NOTE  Pharmacy Consult for heparin Indication: atrial fibrillation  No Known Allergies  Patient Measurements: Weight: 92.5 kg (203 lb 14.8 oz) Last weight 08/19/22 = 204 lb Heparin Dosing Weight: 92 kg  Vital Signs: Temp: 98.2 F (36.8 C) (11/15 1126) Temp Source: Oral (11/15 1126) BP: 102/80 (11/15 1126) Pulse Rate: 75 (11/15 1126)  Labs: Recent Labs    09/28/22 0441 09/28/22 0441 09/29/22 0400 09/29/22 1624 09/30/22 0312 09/30/22 1410  HGB 14.3  --  14.5  --  14.2  --   HCT 42.8  --  44.7  --  42.9  --   PLT 142*  --  147*  --  136*  --   APTT  --    < > 87* 49* 176* 115*  HEPARINUNFRC  --   --  0.58  --  0.86* 0.59  CREATININE 1.15  --   --   --   --   --    < > = values in this interval not displayed.     Estimated Creatinine Clearance: 58.5 mL/min (by C-G formula based on SCr of 1.15 mg/dL).   Medical History: Past Medical History:  Diagnosis Date   Abdominal pain, epigastric    Allergic rhinitis, cause unspecified    Arthritis    BPH (benign prostatic hyperplasia)    CHF (congestive heart failure) (HCC)    Chronic anticoagulation    xarelto   Cognitive communication deficit 09/29/2022   Coronary artery calcification seen on CAT scan 12/26/2012   Depressive disorder, not elsewhere classified    Diabetes mellitus treated with oral medication ([place]) 09/28/2022   Diverticulosis of colon (without mention of hemorrhage)    Esophageal reflux    High risk medication use    Hyperlipidemia    Hyperplastic rectal polyp    Hypertension    Lumbago    Pacemaker    Paralyzed hemidiaphragm    LEFT   Persistent atrial fibrillation ([place])    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  Transitioned to heparin with CVA pending CEA procedure 11/16. Last Xarelto dose 11/10 at 20:00. Pharmacy consulted for heparin.   Heparin level and aPTT above goal on 1000 units/hr.  CBC stable.  No bleeding noted.  Goal of Therapy:  Heparin level 0.3 -0.5 units/ml aPTT 66- 85 sec Monitor platelets by anticoagulation protocol: Yes   Plan:  Reduce heparin infusion to 850 units/hr Check heparin level in 8 hours and daily while on heparin Continue to monitor H&H and platelets F/u restart rivaroxaban post-CEA    Horton Chin, Pharm.D., BCPS Clinical Pharmacist  **Pharmacist phone directory can be found on [email or web address] listed under [place].  09/30/2022 3:03 PM

## 2022-09-30 NOTE — Progress Notes (Addendum)
  Progress Note    09/30/2022 7:04 AM * No surgery date entered *   Symptomatic right ICA stenosis Plan is for right CEA tomorrow 10/01/22 with Dr. Stanford Breed Patient did not have any questions regarding surgery NPO after midnight Consent ordered  Karoline Caldwell, PA-C [facility] [phone] 09/30/2022 7:04 AM  VASCULAR STAFF ADDENDUM: I have independently interviewed and examined the patient. I agree with the above.  Discussed plan for surgery with patient in detail. Reviewed risks, benefits and alternatives. Specifically reviewed risk of perioperative stroke (~2%), cranial nerve injury (~5%), and hematoma. He is understanding and wants to proceed. Plan R CEA in the AM. NPO after midnight.  Yevonne Aline. Stanford Breed, MD [facility] Phone Number: [phone] 09/30/2022 2:34 PM

## 2022-09-30 NOTE — Anesthesia Preprocedure Evaluation (Signed)
Anesthesia Evaluation  Patient identified by MRN, date of birth, ID band Patient awake    Reviewed: Allergy & Precautions, NPO status , Patient's Chart, lab work & pertinent test results, reviewed documented beta blocker date and time   History of Anesthesia Complications Negative for: history of anesthetic complications  Airway Mallampati: II  TM Distance: >3 FB Neck ROM: Full    Dental  (+) Edentulous Lower, Edentulous Upper   Pulmonary COPD, former smoker Paralyzed left hemidiaphragm   Pulmonary exam normal        Cardiovascular hypertension, Pt. on medications and Pt. on home beta blockers + CAD and +CHF  Normal cardiovascular exam+ dysrhythmias (on Xarelto) Atrial Fibrillation + pacemaker (for SSS)   Symptomatic right carotid artery stenosis  TTE 08/12/22: EF 55%, grade I DD, RV systolic function mildly reduced, mildly enlarged RV, mildly elevated PASP 42.30mHg, mild RAE, valves ok    Neuro/Psych    Depression    CVA    GI/Hepatic Neg liver ROS,GERD  Medicated and Controlled,,  Endo/Other  diabetes, Type 2, Oral Hypoglycemic Agents    Renal/GU Renal disease  negative genitourinary   Musculoskeletal  (+) Arthritis ,    Abdominal   Peds  Hematology INR 2.8, Plts 136k   Anesthesia Other Findings Day of surgery medications reviewed with patient.  Reproductive/Obstetrics                              Anesthesia Physical Anesthesia Plan  ASA: 4  Anesthesia Plan: General   Post-op Pain Management: Tylenol PO (pre-op)*   Induction: Intravenous  PONV Risk Score and Plan: 2 and Treatment may vary due to age or medical condition, Dexamethasone and Ondansetron  Airway Management Planned: Oral ETT  Additional Equipment: Arterial line  Intra-op Plan:   Post-operative Plan: Extubation in OR  Informed Consent: I have reviewed the patients History and Physical, chart, labs and discussed  the procedure including the risks, benefits and alternatives for the proposed anesthesia with the patient or authorized representative who has indicated his/her understanding and acceptance.     Dental advisory given  Plan Discussed with: CRNA  Anesthesia Plan Comments:         Anesthesia Quick Evaluation

## 2022-09-30 NOTE — Progress Notes (Signed)
Occupational Therapy Treatment Patient Details Name: [name] MRN: [id number] DOB: 05/01/1941 Today's Date: 09/30/2022   History of present illness Pt is a 81 y/o male who presented with L sided weakness/numbness. CT head negative, pending MRI brain (pending pacemaker compatibility). PMH: CAD, SSS s/p PPM, HLD, depression, HTN, a fib, COPD, GERD, BPH, CHF   OT comments  Pt progressing well towards OT goals with self reported improvements in endurance. Session focused on L UE HEP to further address strength and sensation. Provided squeeze ball, theraputty and theraband with good carryover. Provided handouts to improve compliance with exercises. DC recs remain appropriate.   Recommendations for follow up therapy are one component of a multi-disciplinary discharge planning process, led by the attending physician.  Recommendations may be updated based on patient status, additional functional criteria and insurance authorization.    Follow Up Recommendations  No OT follow up     Assistance Recommended at Discharge PRN  Patient can return home with the following  Assist for transportation   Equipment Recommendations  None recommended by OT    Recommendations for Other Services      Precautions / Restrictions Precautions Precautions: Fall Restrictions Weight Bearing Restrictions: No       Mobility Bed Mobility Overal bed mobility: Modified Independent Bed Mobility: Supine to Sit     Supine to sit: Modified independent (Device/Increase time)          Transfers Overall transfer level: Independent Equipment used: None Transfers: Sit to/from Stand, Bed to chair/wheelchair/BSC Sit to Stand: Independent     Step pivot transfers: Independent     General transfer comment: bed > chair     Balance Overall balance assessment: Needs assistance Sitting-balance support: Feet supported Sitting balance-Leahy Scale: Good     Standing balance support: No upper extremity  supported Standing balance-Leahy Scale: Good                             ADL either performed or assessed with clinical judgement   ADL Overall ADL's : Needs assistance/impaired                                       General ADL Comments: Focus on UE HEP to address L sided deficits with squeeze ball, theraputty and theraband.    Extremity/Trunk Assessment Upper Extremity Assessment Upper Extremity Assessment: LUE deficits/detail LUE Deficits / Details: continued numbness though improving, fair grasp. has tendency to drop items if not looking at what he is holding but able to hold phone fairly well LUE Sensation: decreased light touch   Lower Extremity Assessment Lower Extremity Assessment: Defer to PT evaluation        Vision   Vision Assessment?: No apparent visual deficits   Perception     Praxis      Cognition Arousal/Alertness: Awake/alert Behavior During Therapy: WFL for tasks assessed/performed Overall Cognitive Status: Within Functional Limits for tasks assessed                                          Exercises Exercises: General Upper Extremity General Exercises - Upper Extremity Shoulder Flexion: Strengthening, Both, 10 reps, Seated, Theraband Theraband Level (Shoulder Flexion): Level 2 (Red) Shoulder Horizontal ABduction: Strengthening, Both, 10 reps, Seated, Theraband Theraband  Level (Shoulder Horizontal Abduction): Level 2 (Red) Elbow Flexion: Strengthening, Both, 10 reps, Theraband Theraband Level (Elbow Flexion): Level 2 (Red) Elbow Extension: Strengthening, Both, 10 reps, Seated, Theraband Theraband Level (Elbow Extension): Level 2 (Red)    Shoulder Instructions       General Comments      Pertinent Vitals/ Pain       Pain Assessment Pain Assessment: No/denies pain Faces Pain Scale: No hurt  Home Living                                          Prior Functioning/Environment               Frequency  Min 2X/week        Progress Toward Goals  OT Goals(current goals can now be found in the care plan section)  Progress towards OT goals: Progressing toward goals  Acute Rehab OT Goals Patient Stated Goal: improve endurance OT Goal Formulation: With patient Time For Goal Achievement: 10/12/22 Potential to Achieve Goals: Good ADL Goals Pt Will Transfer to Toilet: Independently;ambulating Pt/caregiver will Perform Home Exercise Program: Increased strength;Left upper extremity;With theraband;With theraputty;Independently;With written HEP provided Additional ADL Goal #1: Pt to gather ADL/IADL items Independently without LOB or safety concerns  Plan Discharge plan remains appropriate    Co-evaluation                 AM-PAC OT "6 Clicks" Daily Activity     Outcome Measure   Help from another person eating meals?: None Help from another person taking care of personal grooming?: None Help from another person toileting, which includes using toliet, bedpan, or urinal?: A Little Help from another person bathing (including washing, rinsing, drying)?: A Little Help from another person to put on and taking off regular upper body clothing?: None Help from another person to put on and taking off regular lower body clothing?: None 6 Click Score: 22    End of Session    OT Visit Diagnosis: Muscle weakness (generalized) (M62.81)   Activity Tolerance Patient tolerated treatment well   Patient Left in chair;with call bell/phone within reach   Nurse Communication          Time: 2174-7159 OT Time Calculation (min): 16 min  Charges: OT General Charges $OT Visit: 1 Visit OT Treatments $Therapeutic Exercise: 8-22 mins  Malachy Chamber, OTR/L Acute Rehab Services Office: [phone]   Layla Maw 09/30/2022, 2:59 PM

## 2022-10-01 ENCOUNTER — Inpatient Hospital Stay (HOSPITAL_COMMUNITY): Payer: Medicare Other | Admitting: Anesthesiology

## 2022-10-01 ENCOUNTER — Encounter (HOSPITAL_COMMUNITY)
Admission: EM | Disposition: A | Discharge status: Home / Self Care | Payer: Self-pay | Source: Home / Self Care | Attending: Internal Medicine

## 2022-10-01 ENCOUNTER — Other Ambulatory Visit: Payer: Self-pay

## 2022-10-01 DIAGNOSIS — I2510 Atherosclerotic heart disease of native coronary artery without angina pectoris: Secondary | ICD-10-CM

## 2022-10-01 DIAGNOSIS — J449 Chronic obstructive pulmonary disease, unspecified: Secondary | ICD-10-CM

## 2022-10-01 DIAGNOSIS — I509 Heart failure, unspecified: Secondary | ICD-10-CM

## 2022-10-01 DIAGNOSIS — Z87891 Personal history of nicotine dependence: Secondary | ICD-10-CM

## 2022-10-01 DIAGNOSIS — I132 Hypertensive heart and chronic kidney disease with heart failure and with stage 5 chronic kidney disease, or end stage renal disease: Secondary | ICD-10-CM

## 2022-10-01 DIAGNOSIS — I63231 Cerebral infarction due to unspecified occlusion or stenosis of right carotid arteries: Secondary | ICD-10-CM

## 2022-10-01 DIAGNOSIS — I6521 Occlusion and stenosis of right carotid artery: Secondary | ICD-10-CM

## 2022-10-01 HISTORY — PX: PATCH ANGIOPLASTY: SHX6230

## 2022-10-01 HISTORY — PX: ENDARTERECTOMY: SHX5162

## 2022-10-01 LAB — BASIC METABOLIC PANEL
Anion gap: 15 (ref 5–15)
BUN: 12 mg/dL (ref 8–23)
CO2: 21 mmol/L — ABNORMAL LOW (ref 22–32)
Calcium: 9.4 mg/dL (ref 8.9–10.3)
Chloride: 105 mmol/L (ref 98–111)
Creatinine, Ser: 1.04 mg/dL (ref 0.61–1.24)
GFR, Estimated: >60 mL/min (ref >60)
Glucose, Bld: 103 mg/dL — ABNORMAL HIGH (ref 70–99)
Potassium: 3.9 mmol/L (ref 3.5–5.1)
Sodium: 141 mmol/L (ref 135–145)

## 2022-10-01 LAB — HEPARIN LEVEL (UNFRACTIONATED)
Heparin Unfractionated: 0.9 IU/mL — ABNORMAL HIGH (ref 0.30–0.70)
Heparin Unfractionated: 0.15 IU/mL — ABNORMAL LOW (ref 0.30–0.70)

## 2022-10-01 LAB — CBC
HCT: 45.5 % (ref 39.0–52.0)
Hemoglobin: 14.7 g/dL (ref 13.0–17.0)
MCH: 30.2 pg (ref 26.0–34.0)
MCHC: 32.3 g/dL (ref 30.0–36.0)
MCV: 93.6 fL (ref 80.0–100.0)
Platelets: 147 10*3/uL — ABNORMAL LOW (ref 150–400)
RBC: 4.86 MIL/uL (ref 4.22–5.81)
RDW: 13.2 % (ref 11.5–15.5)
WBC: 7.4 10*3/uL (ref 4.0–10.5)
nRBC: 0 % (ref 0.0–0.2)

## 2022-10-01 LAB — SURGICAL PCR SCREEN
MRSA, PCR: NEGATIVE
Staphylococcus aureus: NEGATIVE

## 2022-10-01 LAB — POCT I-STAT 7, (LYTES, BLD GAS, ICA,H+H)
Acid-Base Excess: 0 mmol/L (ref 0.0–2.0)
Bicarbonate: 25 mmol/L (ref 20.0–28.0)
Calcium, Ion: 1.22 mmol/L (ref 1.15–1.40)
HCT: 40 % (ref 39.0–52.0)
Hemoglobin: 13.6 g/dL (ref 13.0–17.0)
O2 Saturation: 100 %
Potassium: 3.8 mmol/L (ref 3.5–5.1)
Sodium: 140 mmol/L (ref 135–145)
TCO2: 26 mmol/L (ref 22–32)
pCO2 arterial: 42.8 mmHg (ref 32–48)
pH, Arterial: 7.375 (ref 7.35–7.45)
pO2, Arterial: 268 mmHg — ABNORMAL HIGH (ref 83–108)

## 2022-10-01 LAB — POCT ACTIVATED CLOTTING TIME
Activated Clotting Time: 263 seconds
Activated Clotting Time: 305 seconds
Activated Clotting Time: 257 seconds

## 2022-10-01 LAB — MAGNESIUM: Magnesium: 1.9 mg/dL (ref 1.7–2.4)

## 2022-10-01 LAB — TYPE AND SCREEN
ABO/RH(D): B POS
Antibody Screen: NEGATIVE

## 2022-10-01 LAB — APTT: aPTT: 146 seconds — ABNORMAL HIGH (ref 24–36)

## 2022-10-01 LAB — ABO/RH: ABO/RH(D): B POS

## 2022-10-01 SURGERY — ENDARTERECTOMY, CAROTID
Anesthesia: General | Site: Neck | Laterality: Right

## 2022-10-01 MED ORDER — PROPOFOL 10 MG/ML IV BOLUS
INTRAVENOUS | Status: AC
  Filled 2022-10-01: qty 20

## 2022-10-01 MED ORDER — EPHEDRINE SULFATE-NACL 50-0.9 MG/10ML-% IV SOSY
PREFILLED_SYRINGE | INTRAVENOUS | Status: DC | PRN
  Administered 2022-10-01 (×7): 5 mg via INTRAVENOUS

## 2022-10-01 MED ORDER — MORPHINE SULFATE (PF) 2 MG/ML IV SOLN: 2.0000 mg | INTRAVENOUS | Status: DC | PRN

## 2022-10-01 MED ORDER — METOPROLOL TARTRATE 5 MG/5ML IV SOLN: 2.0000 mg | INTRAVENOUS | Status: DC | PRN

## 2022-10-01 MED ORDER — SODIUM CHLORIDE 0.9 % IV SOLN: INTRAVENOUS | Status: DC

## 2022-10-01 MED ORDER — EPHEDRINE 5 MG/ML INJ
INTRAVENOUS | Status: AC
  Filled 2022-10-01: qty 5

## 2022-10-01 MED ORDER — FENTANYL CITRATE (PF) 250 MCG/5ML IJ SOLN
INTRAMUSCULAR | Status: AC
  Filled 2022-10-01: qty 5

## 2022-10-01 MED ORDER — HEPARIN SODIUM (PORCINE) 1000 UNIT/ML IJ SOLN
INTRAMUSCULAR | Status: AC
  Filled 2022-10-01: qty 20

## 2022-10-01 MED ORDER — TRAMADOL HCL 50 MG PO TABS: 50.0000 mg | ORAL_TABLET | Freq: Four times a day (QID) | ORAL | Status: DC | PRN

## 2022-10-01 MED ORDER — ALBUMIN HUMAN 5 % IV SOLN: INTRAVENOUS | Status: DC | PRN

## 2022-10-01 MED ORDER — LACTATED RINGERS IV SOLN: INTRAVENOUS | Status: DC | PRN

## 2022-10-01 MED ORDER — VASOPRESSIN 20 UNIT/ML IV SOLN
INTRAVENOUS | Status: AC
  Filled 2022-10-01: qty 1

## 2022-10-01 MED ORDER — ALUM & MAG HYDROXIDE-SIMETH 200-200-20 MG/5ML PO SUSP: 15.0000 mL | ORAL | Status: DC | PRN

## 2022-10-01 MED ORDER — ONDANSETRON HCL 4 MG/2ML IJ SOLN
INTRAMUSCULAR | Status: DC | PRN
  Administered 2022-10-01: 4 mg via INTRAVENOUS

## 2022-10-01 MED ORDER — 0.9 % SODIUM CHLORIDE (POUR BTL) OPTIME
TOPICAL | Status: DC | PRN
  Administered 2022-10-01 (×2): 500 mL
  Administered 2022-10-01: 1000 mL

## 2022-10-01 MED ORDER — CEFAZOLIN SODIUM-DEXTROSE 2-4 GM/100ML-% IV SOLN
2.0000 g | Freq: Three times a day (TID) | INTRAVENOUS | Status: AC
  Administered 2022-10-01 – 2022-10-02 (×2): 2 g via INTRAVENOUS
  Filled 2022-10-01 (×2): qty 100

## 2022-10-01 MED ORDER — HEPARIN 6000 UNIT IRRIGATION SOLUTION
Status: AC
  Filled 2022-10-01: qty 500

## 2022-10-01 MED ORDER — HEPARIN 6000 UNIT IRRIGATION SOLUTION
Status: DC | PRN
  Administered 2022-10-01: 1

## 2022-10-01 MED ORDER — PROTAMINE SULFATE 10 MG/ML IV SOLN
INTRAVENOUS | Status: DC | PRN
  Administered 2022-10-01: 50 mg via INTRAVENOUS

## 2022-10-01 MED ORDER — LIDOCAINE 2% (20 MG/ML) 5 ML SYRINGE
INTRAMUSCULAR | Status: DC | PRN
  Administered 2022-10-01: 100 mg via INTRAVENOUS

## 2022-10-01 MED ORDER — HYDRALAZINE HCL 20 MG/ML IJ SOLN: 5.0000 mg | INTRAMUSCULAR | Status: DC | PRN

## 2022-10-01 MED ORDER — ROCURONIUM BROMIDE 10 MG/ML (PF) SYRINGE
PREFILLED_SYRINGE | INTRAVENOUS | Status: AC
  Filled 2022-10-01: qty 10

## 2022-10-01 MED ORDER — OXYCODONE HCL 5 MG PO TABS: 5.0000 mg | ORAL_TABLET | Freq: Once | ORAL | Status: DC | PRN

## 2022-10-01 MED ORDER — PROPOFOL 10 MG/ML IV BOLUS
INTRAVENOUS | Status: DC | PRN
  Administered 2022-10-01: 130 mg via INTRAVENOUS
  Administered 2022-10-01: 20 mg via INTRAVENOUS
  Administered 2022-10-01: 10 mg via INTRAVENOUS
  Administered 2022-10-01: 30 mg via INTRAVENOUS
  Administered 2022-10-01: 20 mg via INTRAVENOUS

## 2022-10-01 MED ORDER — DEXAMETHASONE SODIUM PHOSPHATE 10 MG/ML IJ SOLN
INTRAMUSCULAR | Status: AC
  Filled 2022-10-01: qty 1

## 2022-10-01 MED ORDER — ACETAMINOPHEN 500 MG PO TABS: 1000.0000 mg | ORAL_TABLET | Freq: Once | ORAL | Status: AC

## 2022-10-01 MED ORDER — HEPARIN SODIUM (PORCINE) 5000 UNIT/ML IJ SOLN
5000.0000 [IU] | Freq: Three times a day (TID) | INTRAMUSCULAR | Status: DC
  Administered 2022-10-02: 5000 [IU] via SUBCUTANEOUS
  Filled 2022-10-01: qty 1

## 2022-10-01 MED ORDER — SODIUM CHLORIDE 0.9 % IV SOLN: 500.0000 mL | Freq: Once | INTRAVENOUS | Status: DC | PRN

## 2022-10-01 MED ORDER — LIDOCAINE 2% (20 MG/ML) 5 ML SYRINGE
INTRAMUSCULAR | Status: AC
  Filled 2022-10-01: qty 5

## 2022-10-01 MED ORDER — FENTANYL CITRATE (PF) 100 MCG/2ML IJ SOLN: 25.0000 ug | INTRAMUSCULAR | Status: DC | PRN

## 2022-10-01 MED ORDER — PHENOL 1.4 % MT LIQD: 1.0000 | OROMUCOSAL | Status: DC | PRN

## 2022-10-01 MED ORDER — ROCURONIUM BROMIDE 10 MG/ML (PF) SYRINGE
PREFILLED_SYRINGE | INTRAVENOUS | Status: DC | PRN
  Administered 2022-10-01: 60 mg via INTRAVENOUS
  Administered 2022-10-01: 10 mg via INTRAVENOUS

## 2022-10-01 MED ORDER — PHENYLEPHRINE HCL-NACL 20-0.9 MG/250ML-% IV SOLN
INTRAVENOUS | Status: DC | PRN
  Administered 2022-10-01: 40 ug/min via INTRAVENOUS

## 2022-10-01 MED ORDER — SODIUM CHLORIDE 0.9 % IV SOLN
0.1500 ug/kg/min | INTRAVENOUS | Status: DC
  Filled 2022-10-01 (×3): qty 2000

## 2022-10-01 MED ORDER — FENTANYL CITRATE (PF) 250 MCG/5ML IJ SOLN
INTRAMUSCULAR | Status: DC | PRN
  Administered 2022-10-01: 100 ug via INTRAVENOUS

## 2022-10-01 MED ORDER — CEFAZOLIN SODIUM 1 G IJ SOLR
INTRAMUSCULAR | Status: AC
  Filled 2022-10-01: qty 20

## 2022-10-01 MED ORDER — PHENYLEPHRINE 80 MCG/ML (10ML) SYRINGE FOR IV PUSH (FOR BLOOD PRESSURE SUPPORT)
PREFILLED_SYRINGE | INTRAVENOUS | Status: DC | PRN
  Administered 2022-10-01: 160 ug via INTRAVENOUS
  Administered 2022-10-01: 80 ug via INTRAVENOUS
  Administered 2022-10-01: 160 ug via INTRAVENOUS
  Administered 2022-10-01: 80 ug via INTRAVENOUS

## 2022-10-01 MED ORDER — DEXAMETHASONE SODIUM PHOSPHATE 10 MG/ML IJ SOLN
INTRAMUSCULAR | Status: DC | PRN
  Administered 2022-10-01: 5 mg via INTRAVENOUS

## 2022-10-01 MED ORDER — MAGNESIUM SULFATE 2 GM/50ML IV SOLN: 2.0000 g | Freq: Every day | INTRAVENOUS | Status: DC | PRN

## 2022-10-01 MED ORDER — OXYCODONE HCL 5 MG/5ML PO SOLN: 5.0000 mg | Freq: Once | ORAL | Status: DC | PRN

## 2022-10-01 MED ORDER — LABETALOL HCL 5 MG/ML IV SOLN: 10.0000 mg | INTRAVENOUS | Status: DC | PRN

## 2022-10-01 MED ORDER — POTASSIUM CHLORIDE CRYS ER 20 MEQ PO TBCR: 20.0000 meq | EXTENDED_RELEASE_TABLET | Freq: Every day | ORAL | Status: DC | PRN

## 2022-10-01 MED ORDER — VASOPRESSIN 20 UNIT/ML IV SOLN
INTRAVENOUS | Status: DC | PRN
  Administered 2022-10-01 (×3): 1 [IU] via INTRAVENOUS
  Administered 2022-10-01 (×2): .5 [IU] via INTRAVENOUS
  Administered 2022-10-01 (×4): 1 [IU] via INTRAVENOUS

## 2022-10-01 MED ORDER — ONDANSETRON HCL 4 MG/2ML IJ SOLN
INTRAMUSCULAR | Status: AC
  Filled 2022-10-01: qty 2

## 2022-10-01 MED ORDER — SUGAMMADEX SODIUM 200 MG/2ML IV SOLN
INTRAVENOUS | Status: DC | PRN
  Administered 2022-10-01: 200 mg via INTRAVENOUS

## 2022-10-01 MED ORDER — CEFAZOLIN SODIUM-DEXTROSE 2-3 GM-%(50ML) IV SOLR
INTRAVENOUS | Status: DC | PRN
  Administered 2022-10-01: 2 g via INTRAVENOUS

## 2022-10-01 MED ORDER — ONDANSETRON HCL 4 MG/2ML IJ SOLN: 4.0000 mg | Freq: Four times a day (QID) | INTRAMUSCULAR | Status: DC | PRN

## 2022-10-01 MED ORDER — GUAIFENESIN-DM 100-10 MG/5ML PO SYRP: 15.0000 mL | ORAL_SOLUTION | ORAL | Status: DC | PRN

## 2022-10-01 MED ORDER — STERILE WATER FOR IRRIGATION IR SOLN
Status: DC | PRN
  Administered 2022-10-01: 1000 mL

## 2022-10-01 MED ORDER — CLEVIDIPINE BUTYRATE 0.5 MG/ML IV EMUL
1.0000 mg/h | Freq: Once | INTRAVENOUS | Status: DC
  Filled 2022-10-01: qty 50

## 2022-10-01 MED ORDER — PHENYLEPHRINE 80 MCG/ML (10ML) SYRINGE FOR IV PUSH (FOR BLOOD PRESSURE SUPPORT)
PREFILLED_SYRINGE | INTRAVENOUS | Status: AC
  Filled 2022-10-01: qty 10

## 2022-10-01 MED ORDER — DOCUSATE SODIUM 100 MG PO CAPS
100.0000 mg | ORAL_CAPSULE | Freq: Every day | ORAL | Status: DC
  Administered 2022-10-02: 100 mg via ORAL
  Filled 2022-10-01: qty 1

## 2022-10-01 SURGICAL SUPPLY — 46 items
APL PRP STRL LF DISP 70% ISPRP (MISCELLANEOUS) ×1
APL SKNCLS STERI-STRIP NONHPOA (GAUZE/BANDAGES/DRESSINGS) ×1
BENZOIN TINCTURE PRP APPL 2/3 (GAUZE/BANDAGES/DRESSINGS) ×2 IMPLANT
BLADE MINI RND TIP GREEN BEAV (BLADE) ×2 IMPLANT
CANISTER SUCT 3000ML PPV (MISCELLANEOUS) ×2 IMPLANT
CANNULA VESSEL 3MM 2 BLNT TIP (CANNULA) ×4 IMPLANT
CATH ROBINSON RED A/P 18FR (CATHETERS) ×2 IMPLANT
CHLORAPREP W/TINT 26 (MISCELLANEOUS) ×2 IMPLANT
CLSR STERI-STRIP ANTIMIC 1/2X4 (GAUZE/BANDAGES/DRESSINGS) IMPLANT
COVER PROBE W GEL 5X96 (DRAPES) ×2 IMPLANT
DRAIN CHANNEL 15F RND FF W/TCR (WOUND CARE) IMPLANT
DRSG COVADERM 4X8 (GAUZE/BANDAGES/DRESSINGS) ×2 IMPLANT
ELECT REM PT RETURN 9FT ADLT (ELECTROSURGICAL) ×1
ELECTRODE REM PT RTRN 9FT ADLT (ELECTROSURGICAL) ×2 IMPLANT
EVACUATOR SILICONE 100CC (DRAIN) IMPLANT
GLOVE BIO SURGEON STRL SZ 6.5 (GLOVE) IMPLANT
GLOVE BIO SURGEON STRL SZ8 (GLOVE) ×2 IMPLANT
GLOVE BIOGEL PI IND STRL 6.5 (GLOVE) IMPLANT
GOWN STRL REUS W/ TWL LRG LVL3 (GOWN DISPOSABLE) ×4 IMPLANT
GOWN STRL REUS W/ TWL XL LVL3 (GOWN DISPOSABLE) ×2 IMPLANT
GOWN STRL REUS W/TWL LRG LVL3 (GOWN DISPOSABLE) ×2
GOWN STRL REUS W/TWL XL LVL3 (GOWN DISPOSABLE) ×1
KIT BASIN OR (CUSTOM PROCEDURE TRAY) ×2 IMPLANT
KIT SHUNT ARGYLE CAROTID ART 6 (VASCULAR PRODUCTS) ×2 IMPLANT
KIT TURNOVER KIT B (KITS) ×2 IMPLANT
NDL HYPO 25GX1X1/2 BEV (NEEDLE) IMPLANT
NEEDLE HYPO 25GX1X1/2 BEV (NEEDLE) IMPLANT
NS IRRIG 1000ML POUR BTL (IV SOLUTION) ×6 IMPLANT
PACK CAROTID (CUSTOM PROCEDURE TRAY) ×2 IMPLANT
PAD ARMBOARD 7.5X6 YLW CONV (MISCELLANEOUS) ×4 IMPLANT
PATCH VASC XENOSURE 1CMX6CM (Vascular Products) ×1 IMPLANT
PATCH VASC XENOSURE 1X6 (Vascular Products) IMPLANT
POSITIONER HEAD DONUT 9IN (MISCELLANEOUS) ×2 IMPLANT
STRIP CLOSURE SKIN 1/2X4 (GAUZE/BANDAGES/DRESSINGS) ×2 IMPLANT
SUT MNCRL AB 4-0 PS2 18 (SUTURE) ×2 IMPLANT
SUT PROLENE 6 0 BV (SUTURE) ×4 IMPLANT
SUT PROLENE 7 0 BV1 MDA (SUTURE) IMPLANT
SUT SILK 3 0 (SUTURE)
SUT SILK 3-0 18XBRD TIE 12 (SUTURE) IMPLANT
SUT VIC AB 2-0 CT1 27 (SUTURE) ×1
SUT VIC AB 2-0 CT1 TAPERPNT 27 (SUTURE) ×2 IMPLANT
SUT VIC AB 3-0 SH 27 (SUTURE) ×1
SUT VIC AB 3-0 SH 27X BRD (SUTURE) ×2 IMPLANT
SYR CONTROL 10ML LL (SYRINGE) IMPLANT
TOWEL GREEN STERILE (TOWEL DISPOSABLE) ×2 IMPLANT
WATER STERILE IRR 1000ML POUR (IV SOLUTION) ×2 IMPLANT

## 2022-10-01 NOTE — Anesthesia Postprocedure Evaluation (Signed)
Anesthesia Post Note  Patient: HERO KULISH  Procedure(s) Performed: ENDARTERECTOMY CAROTID (Right: Neck) RIGHT CAROTID ARTERY PATCH ANGIOPLASTY WITH 1CM x 6CM XENOSURE BIOLOGIC PATCH (Right: Neck)     Patient location during evaluation: PACU Anesthesia Type: General Level of consciousness: awake and alert Pain management: pain level controlled Vital Signs Assessment: post-procedure vital signs reviewed and stable Respiratory status: spontaneous breathing, nonlabored ventilation and respiratory function stable Cardiovascular status: blood pressure returned to baseline Postop Assessment: no apparent nausea or vomiting Anesthetic complications: no   No notable events documented.  Last Vitals:  Vitals:   10/01/22 1200 10/01/22 1215  BP: 139/85 139/88  Pulse: 81 82  Resp: 17 15  Temp:  37.3 C  SpO2: 98% 98%    Last Pain:  Vitals:   10/01/22 1215  TempSrc:   PainSc: 0-No pain    LLE Motor Response: Purposeful movement;Responds to commands (10/01/22 1215) LLE Sensation: Full sensation (10/01/22 1215) RLE Motor Response: Purposeful movement;Responds to commands (10/01/22 1215) RLE Sensation: Full sensation (10/01/22 1215)      Marthenia Rolling

## 2022-10-01 NOTE — Anesthesia Procedure Notes (Signed)
Procedure Name: Intubation Date/Time: 10/01/2022 7:51 AM  Performed by: Erick Colace, CRNAPre-anesthesia Checklist: Patient identified, Emergency Drugs available, Suction available and Patient being monitored Patient Re-evaluated:Patient Re-evaluated prior to induction Oxygen Delivery Method: Circle system utilized Preoxygenation: Pre-oxygenation with 100% oxygen Induction Type: IV induction Ventilation: Mask ventilation without difficulty Laryngoscope Size: Mac and 4 Grade View: Grade I Tube type: Oral Tube size: 7.5 mm Number of attempts: 1 Airway Equipment and Method: Stylet and Oral airway Placement Confirmation: ETT inserted through vocal cords under direct vision, positive ETCO2 and breath sounds checked- equal and bilateral Secured at: 22 cm Tube secured with: Tape Dental Injury: Teeth and Oropharynx as per pre-operative assessment

## 2022-10-01 NOTE — Transfer of Care (Signed)
Immediate Anesthesia Transfer of Care Note  Patient: [name]  Procedure(s) Performed: ENDARTERECTOMY CAROTID (Right: Neck)  Patient Location: PACU  Anesthesia Type:General  Level of Consciousness: awake and alert   Airway & Oxygen Therapy: Patient Spontanous Breathing  Post-op Assessment: Report given to RN, Post -op Vital signs reviewed and stable, Patient moving all extremities X 4, and Patient able to stick tongue midline  Post vital signs: Reviewed and stable  Last Vitals:  Vitals Value Taken Time  BP 111/79 10/01/22 1015  Temp 37.3 C 10/01/22 1014  Pulse 84 10/01/22 1018  Resp 17 10/01/22 1018  SpO2 92 % 10/01/22 1018  Vitals shown include unvalidated device data.  Last Pain:  Vitals:   10/01/22 0333  TempSrc: Oral  PainSc:          Complications: No notable events documented.

## 2022-10-01 NOTE — Progress Notes (Signed)
SLP Cancellation Note  Patient Details Name: [name] MRN: [id number] DOB: Nov 30, 1940   Cancelled treatment:        Pt currently in PACU following carotid endarterectomy. Will continue efforts on another day for cognitive therapy.    Houston Siren 10/01/2022, 11:51 AM

## 2022-10-01 NOTE — Anesthesia Procedure Notes (Addendum)
Arterial Line Insertion Start/End11/16/2023 7:12 AM, 10/01/2022 7:15 AM Performed by: Brennan Bailey, MD, anesthesiologist  Patient location: Pre-op. Preanesthetic checklist: patient identified, IV checked, site marked, risks and benefits discussed, surgical consent, monitors and equipment checked, pre-op evaluation, timeout performed and anesthesia consent Lidocaine 1% used for infiltration Left, radial was placed Catheter size: 20 G Hand hygiene performed  and maximum sterile barriers used   Attempts: 2 Procedure performed using ultrasound guided technique. Ultrasound Notes:anatomy identified, needle tip was noted to be adjacent to the nerve/plexus identified, no ultrasound evidence of intravascular and/or intraneural injection and image(s) printed for medical record Following insertion, dressing applied and Biopatch. Post procedure assessment: normal and unchanged  Post procedure complications: second provider assisted and unsuccessful attempts. Patient tolerated the procedure well with no immediate complications. Additional procedure comments: CRNA unable to access radial artery on left. 2 attempts with [place] by myself (artery with significant calcification distally). Daiva Huge, MD.

## 2022-10-01 NOTE — Progress Notes (Signed)
Report called to [facility].  Daughter accompanied him to surgery and took all of his belongings with them.

## 2022-10-01 NOTE — Progress Notes (Signed)
ANTICOAGULATION CONSULT NOTE  Pharmacy Consult for heparin Indication: atrial fibrillation  No Known Allergies  Patient Measurements: Weight: 92.5 kg (203 lb 14.8 oz) Last weight 08/19/22 = 204 lb Heparin Dosing Weight: 92 kg  Vital Signs: Temp: 97.9 F (36.6 C) (11/15 2353) Temp Source: Oral (11/15 2353) BP: 132/87 (11/15 2353) Pulse Rate: 69 (11/15 2353)  Labs: Recent Labs    09/28/22 0441 09/28/22 0441 09/29/22 0400 09/29/22 1624 09/30/22 0312 09/30/22 1410 10/01/22 0059  HGB 14.3  --  14.5  --  14.2  --   --   HCT 42.8  --  44.7  --  42.9  --   --   PLT 142*  --  147*  --  136*  --   --   APTT  --   --  87*   < > 176* 115* 146*  HEPARINUNFRC  --    < > 0.58  --  0.86* 0.59 0.90*  CREATININE 1.15  --   --   --   --   --   --    < > = values in this interval not displayed.     Estimated Creatinine Clearance: 58.5 mL/min (by C-G formula based on SCr of 1.15 mg/dL).   Medical History: Past Medical History:  Diagnosis Date   Abdominal pain, epigastric    Allergic rhinitis, cause unspecified    Arthritis    BPH (benign prostatic hyperplasia)    CHF (congestive heart failure) (HCC)    Chronic anticoagulation    xarelto   Cognitive communication deficit 09/29/2022   Coronary artery calcification seen on CAT scan 12/26/2012   Depressive disorder, not elsewhere classified    Diabetes mellitus treated with oral medication ([place]) 09/28/2022   Diverticulosis of colon (without mention of hemorrhage)    Esophageal reflux    High risk medication use    Hyperlipidemia    Hyperplastic rectal polyp    Hypertension    Lumbago    Pacemaker    Paralyzed hemidiaphragm    LEFT   Persistent atrial fibrillation ([place])    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  Transitioned to heparin with CVA pending CEA procedure 11/16. Last Xarelto dose 11/10 at 20:00. Pharmacy consulted for heparin.   11/16 AM update:  Heparin level above goal No need for further aPTT's  Goal of Therapy:  Heparin level 0.3 -0.5 units/ml Monitor platelets by anticoagulation protocol: Yes   Plan:  Dec heparin to 700 units/hr 1100 heparin level F/u restart rivaroxaban post-CEA    Narda Bonds, PharmD, BCPS Clinical Pharmacist Phone: [phone]

## 2022-10-01 NOTE — Plan of Care (Signed)
  Problem: Education: Goal: Understanding of CV disease, CV risk reduction, and recovery process will improve Outcome: Progressing Goal: Individualized Educational Video(s) Outcome: Progressing   Problem: Activity: Goal: Ability to return to baseline activity level will improve Outcome: Progressing   Problem: Cardiovascular: Goal: Ability to achieve and maintain adequate cardiovascular perfusion will improve Outcome: Progressing Goal: Vascular access site(s) Level 0-1 will be maintained Outcome: Progressing   Problem: Health Behavior/Discharge Planning: Goal: Ability to safely manage health-related needs after discharge will improve Outcome: Progressing   Problem: Education: Goal: Knowledge of disease or condition will improve Outcome: Progressing Goal: Knowledge of secondary prevention will improve (MUST DOCUMENT ALL) Outcome: Progressing Goal: Knowledge of patient specific risk factors will improve (Mark N/A or DELETE if not current risk factor) Outcome: Progressing   Problem: Ischemic Stroke/TIA Tissue Perfusion: Goal: Complications of ischemic stroke/TIA will be minimized Outcome: Progressing   Problem: Coping: Goal: Will verbalize positive feelings about self Outcome: Progressing Goal: Will identify appropriate support needs Outcome: Progressing   Problem: Health Behavior/Discharge Planning: Goal: Ability to manage health-related needs will improve Outcome: Progressing Goal: Goals will be collaboratively established with patient/family Outcome: Progressing   Problem: Self-Care: Goal: Ability to participate in self-care as condition permits will improve Outcome: Progressing Goal: Verbalization of feelings and concerns over difficulty with self-care will improve Outcome: Progressing Goal: Ability to communicate needs accurately will improve Outcome: Progressing   Problem: Nutrition: Goal: Risk of aspiration will decrease Outcome: Progressing Goal: Dietary  intake will improve Outcome: Progressing   Problem: Education: Goal: Knowledge of General Education information will improve Description: Including pain rating scale, medication(s)/side effects and non-pharmacologic comfort measures Outcome: Progressing   Problem: Health Behavior/Discharge Planning: Goal: Ability to manage health-related needs will improve Outcome: Progressing   Problem: Clinical Measurements: Goal: Ability to maintain clinical measurements within normal limits will improve Outcome: Progressing Goal: Will remain free from infection Outcome: Progressing Goal: Diagnostic test results will improve Outcome: Progressing Goal: Respiratory complications will improve Outcome: Progressing Goal: Cardiovascular complication will be avoided Outcome: Progressing   Problem: Activity: Goal: Risk for activity intolerance will decrease Outcome: Progressing   Problem: Nutrition: Goal: Adequate nutrition will be maintained Outcome: Progressing   Problem: Coping: Goal: Level of anxiety will decrease Outcome: Progressing   Problem: Elimination: Goal: Will not experience complications related to bowel motility Outcome: Progressing Goal: Will not experience complications related to urinary retention Outcome: Progressing   Problem: Pain Managment: Goal: General experience of comfort will improve Outcome: Progressing   Problem: Safety: Goal: Ability to remain free from injury will improve Outcome: Progressing   Problem: Skin Integrity: Goal: Risk for impaired skin integrity will decrease Outcome: Progressing   

## 2022-10-01 NOTE — Op Note (Signed)
DATE OF SERVICE: 10/01/2022  PATIENT:  [name]  81 y.o. male  PRE-OPERATIVE DIAGNOSIS:  right symptomatic >50% carotid stenosis  POST-OPERATIVE DIAGNOSIS:  Same  PROCEDURE:   Right carotid endarterectomy with bovine pericardial patch angioplasty  SURGEON:  Surgeon(s) and Role:    * Cherre Robins, MD - Primary  ASSISTANT: Vicente Serene, PA-C  An experienced assistant was required given the complexity of this procedure and the standard of surgical care. My assistant helped with exposure through counter tension, suctioning, ligation and retraction to better visualize the surgical field.  My assistant expedited sewing during the case by following my sutures. Wherever I use the term "we" in the report, my assistant actively helped me with that portion of the procedure.  ANESTHESIA:   general  EBL: 130m  BLOOD ADMINISTERED:none  DRAINS: none   LOCAL MEDICATIONS USED:  NONE  SPECIMEN:  none  COUNTS: confirmed correct.  TOURNIQUET:  none  PATIENT DISPOSITION:  PACU - hemodynamically stable.   Delay start of Pharmacological VTE agent (>24hrs) due to surgical blood loss or risk of bleeding: no  INDICATION FOR PROCEDURE: [name] a 81y.o. 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  His NIH stroke scale preoperatively was 4.  He had some mild left upper extremity weakness. After careful discussion of risks, benefits, and alternatives the patient was offered right carotid endarterectomy. We specifically discussed risk of stroke, cranial nerve injury, hematoma. The patient understood and wished to proceed.  OPERATIVE FINDINGS: Successful endarterectomy.  Greater than 50% plaque encountered at the bifurcation of the right carotid artery.  Successful endarterectomy.  Good result from patch angioplasty.  Good Doppler flow heard in all 3 carotid arteries at completion.  No evidence of intimal flap or intraluminal debris on duplex ultrasound.  Patient  awoke neurologically intact moving all 4 extremities to command, with midline tongue and clear voice.  DESCRIPTION OF PROCEDURE: After identification of the patient in the pre-operative holding area, the patient was transferred to the operating room. The patient was positioned supine on the operating room table. Anesthesia was induced. The right neck was prepped and draped in standard fashion. A surgical pause was performed confirming correct patient, procedure, and operative location.  Sound was used to map the course of the right carotid arteries.  A longitudinal incision was made over the anterior aspect of the sternocleidomastoid and carried down through the subcutaneous tissue with Bovie electrocautery.  The platysma was identified and divided with Bovie electrocautery.  The platysma was grasped with Allis clamps and elevated.  An avascular plane over the sternocleidomastoid was developed onto the carotid sheath.  The jugular vein was identified and skeletonized throughout the length of the exposure.  2 facial vein branches were identified, ligated with silk suture proximally and distally, and divided.  The patient was systemically heparinized.  The carotid artery was visualized.  The carotid artery was skeletonized from the common carotid artery through the bifurcation onto several centimeters both the external and internal carotid arteries.  The vagus nerve was identified.  The hypoglossal nerve was identified.  Both were protected.  The internal carotid artery was encircled with a Silastic vessel loop.  The external carotid artery was encircled with a Silastic vessel loop.  The common carotid artery was encircled with a Rummel tourniquet.  After confirming adequate activated clotting time measurement and systolic blood pressure with the anesthesia team, we clamped the internal carotid artery, followed by the common carotid artery, followed  by the external carotid artery.  An anterior arteriotomy was made  on the common carotid artery and extended across the lesion onto the internal carotid artery.  This was lengthened with Potts scissors.  When we reached a good endpoint the arteriotomy was stopped.  I tested the backbleeding from the internal carotid artery and found it to be robust.  Satisfied we did not use the sheath.  Carotid endarterectomy was then performed using standard technique with a Garment/textile technologist.  After removing the plaque the endarterectomy plane was carefully inspected with loupe magnification.  Any and all debris was removed manually.  I tested the distal endpoint and found it to be secure.  In an abundance of caution I tacked the distal endpoint using 3 interrupted 7-0 Prolene sutures.  I then scanned down the endarterectomy plane and evaluated the external carotid orifice.  I found no debris or flap tear and brisk backbleeding.  The common carotid endpoint was not mobile and had no debris.  Satisfied we proceeded with patch angioplasty.  A bovine pericardial patch was brought in the field and prepared per the manufacturer's instructions.  This was sewn to the arteriotomy using continuous running suture of 6-0 Prolene.  Immediately prior to completion the patch was flushed with heparinized saline.  I then released the clamp on the external carotid artery and found good backbleeding.  The repair was completed.  1 repair stitch was needed.  I then released a clamp on the common carotid artery and allowed several beats of systole to proceed before releasing the clamp on the internal carotid artery.  After all clamps were released, we interrogated the repair with both Doppler and duplex.  Doppler examination showed a continuous forward diastolic signal in the internal carotid artery, a harsh systolic signal in the external carotid artery, and a mixed signal in the common carotid artery.  This was reassuring.  Duplex ultrasound was then performed.  I saw no intraluminal debris or flaps.  Satisfied we  ended the case here.  Heparin was reversed with protamine.  Hemostasis was achieved in the surgical bed.  The platysma was closed with 3-0 Prolene.  The skin was closed with subcuticular stitch of 4-0 Monocryl.  Benzoin and Steri-Strips were applied.  A bandage was applied.  A neurologic exam was performed immediately after emergence from anesthesia.  The patient moves all 4 extremities to command.  His tongue was midline.  His voice is clear.  Upon completion of the case instrument and sharps counts were confirmed correct. The patient was transferred to the PACU in good condition.  Baseline neurologic exam was reconfirmed in the PACU.  I was present for all portions of the procedure.  Yevonne Aline. Stanford Breed, MD [facility] Phone Number: [phone] 10/01/2022 10:45 AM

## 2022-10-01 NOTE — Discharge Instructions (Addendum)
[facility]  Discharge Instructions   Carotid Surgery  Please refer to the following instructions for your post-procedure care. Your surgeon or physician assistant will discuss any changes with you.  Activity  You are encouraged to walk as much as you can. You can slowly return to normal activities but must avoid strenuous activity and heavy lifting until your doctor tell you it's okay. Avoid activities such as vacuuming or swinging a golf club. You can drive after one week if you are comfortable and you are no longer taking prescription pain medications. It is normal to feel tired for serval weeks after your surgery. It is also normal to have difficulty with sleep habits, eating, and bowel movements after surgery. These will go away with time.  Bathing/Showering  Shower daily after you go home. Do not soak in a bathtub, hot tub, or swim until the incision heals completely.  Incision Care  Shower every day. Clean your incision with mild soap and water. Pat the area dry with a clean towel. You do not need a bandage unless otherwise instructed. Do not apply any ointments or creams to your incision. You may have skin glue on your incision. Do not peel it off. It will come off on its own in about one week. Your incision may feel thickened and raised for several weeks after your surgery. This is normal and the skin will soften over time.   For Men Only: It's okay to shave around the incision but do not shave the incision itself for 2 weeks. It is common to have numbness under your chin that could last for several months.  Diet  Resume your normal diet. There are no special food restrictions following this procedure. A low fat/low cholesterol diet is recommended for all patients with vascular disease. In order to heal from your surgery, it is CRITICAL to get adequate nutrition. Your body requires vitamins, minerals, and protein. Vegetables are the best source of  vitamins and minerals. Vegetables also provide the perfect balance of protein. Processed food has little nutritional value, so try to avoid this.  Medications  Resume taking all of your medications unless your doctor or physician assistant tells you not to. If your incision is causing pain, you may take over-the- counter pain relievers such as acetaminophen (Tylenol). If you were prescribed a stronger pain medication, please be aware these medications can cause nausea and constipation. Prevent nausea by taking the medication with a snack or meal. Avoid constipation by drinking plenty of fluids and eating foods with a high amount of fiber, such as fruits, vegetables, and grains.  Do not take Tylenol if you are taking prescription pain medications.  Follow Up  Our office will schedule a follow up appointment 2-3 weeks following discharge.  Please call us immediately for any of the following conditions  Increased pain, redness, drainage (pus) from your incision site. Fever of 101 degrees or higher. If you should develop stroke (slurred speech, difficulty swallowing, weakness on one side of your body, loss of vision) you should call 911 and go to the nearest emergency room.  Reduce your risk of vascular disease:  Stop smoking. If you would like help call [facility] at 1-800-QUIT-NOW 7784755406) or [place] at [phone]. Manage your cholesterol Maintain a desired weight Control your diabetes Keep your blood pressure down  If you have any questions, please call the office at [phone].   ----------------------------------------------------------------------------------------------------------------------------------------------------------------------- Information on my medicine - XARELTO (Rivaroxaban)  This medication  education was reviewed with me or my healthcare representative as part of my discharge preparation.   Why was Xarelto prescribed for you? Xarelto was  prescribed for you to reduce the risk of a blood clot forming that can cause a stroke if you have a medical condition called atrial fibrillation (a type of irregular heartbeat).  What do you need to know about xarelto ? Take your Xarelto ONCE DAILY at the same time every day with your evening meal. If you have difficulty swallowing the tablet whole, you may crush it and mix in applesauce just prior to taking your dose.  Take Xarelto exactly as prescribed by your doctor and DO NOT stop taking Xarelto without talking to the doctor who prescribed the medication.  Stopping without other stroke prevention medication to take the place of Xarelto may increase your risk of developing a clot that causes a stroke.  Refill your prescription before you run out.  After discharge, you should have regular check-up appointments with your healthcare provider that is prescribing your Xarelto.  In the future your dose may need to be changed if your kidney function or weight changes by a significant amount.  What do you do if you miss a dose? If you are taking Xarelto ONCE DAILY and you miss a dose, take it as soon as you remember on the same day then continue your regularly scheduled once daily regimen the next day. Do not take two doses of Xarelto at the same time or on the same day.   Important Safety Information A possible side effect of Xarelto is bleeding. You should call your healthcare provider right away if you experience any of the following: Bleeding from an injury or your nose that does not stop. Unusual colored urine (red or dark brown) or unusual colored stools (red or black). Unusual bruising for unknown reasons. A serious fall or if you hit your head (even if there is no bleeding).  Some medicines may interact with Xarelto and might increase your risk of bleeding while on Xarelto. To help avoid this, consult your healthcare provider or pharmacist prior to using any new prescription or  non-prescription medications, including herbals, vitamins, non-steroidal anti-inflammatory drugs (NSAIDs) and supplements.  This website has more information on Xarelto: [email or web address]

## 2022-10-01 NOTE — Progress Notes (Signed)
  Progress Note    10/01/2022 3:27 PM Day of Surgery  Subjective:  no complaints. Right neck mildly sore. Denies any trouble speaking or swallowing. Moving extremities. Has not eaten yet   Vitals:   10/01/22 1234 10/01/22 1500  BP: (!) 140/88 (!) 145/91  Pulse: 87 90  Resp:  16  Temp:  98.2 F (36.8 C)  SpO2: 99% 98%   Physical Exam: Cardiac:  regular Lungs:  non labored Incisions:  right neck incision is intact. Mild oozing. Soft without swelling or hematoma Extremities:  moving all extremities without deficits Neurologic: alert and oriented. Speech coherent. Smile intact. Tongue midline  CBC    Component Value Date/Time   WBC 7.4 10/01/2022 0617   RBC 4.86 10/01/2022 0617   HGB 13.6 10/01/2022 0807   HGB 14.9 09/03/2022 1229   HCT 40.0 10/01/2022 0807   HCT 43.9 09/03/2022 1229   PLT 147 (L) 10/01/2022 0617   PLT 159 09/03/2022 1229   MCV 93.6 10/01/2022 0617   MCV 91 09/03/2022 1229   MCH 30.2 10/01/2022 0617   MCHC 32.3 10/01/2022 0617   RDW 13.2 10/01/2022 0617   RDW 13.9 09/03/2022 1229   LYMPHSABS 1.6 09/26/2022 1119   LYMPHSABS 2.1 09/18/2020 1549   MONOABS 0.8 09/26/2022 1119   EOSABS 0.0 09/26/2022 1119   EOSABS 0.1 09/18/2020 1549   BASOSABS 0.0 09/26/2022 1119   BASOSABS 0.1 09/18/2020 1549    BMET    Component Value Date/Time   NA 140 10/01/2022 0807   NA 140 09/03/2022 1229   K 3.8 10/01/2022 0807   CL 105 10/01/2022 0617   CO2 21 (L) 10/01/2022 0617   GLUCOSE 103 (H) 10/01/2022 0617   GLUCOSE 86 10/25/2006 0935   BUN 12 10/01/2022 0617   BUN 16 09/03/2022 1229   CREATININE 1.04 10/01/2022 0617   CREATININE 1.03 12/11/2015 0912   CALCIUM 9.4 10/01/2022 0617   GFRNONAA >60 10/01/2022 0617   GFRAA 89 09/18/2020 1549    INR    Component Value Date/Time   INR 2.8 (H) 09/26/2022 1119     Intake/Output Summary (Last 24 hours) at 10/01/2022 1527 Last data filed at 10/01/2022 1400 Gross per 24 hour  Intake 2400 ml  Output 825 ml   Net 1575 ml     Assessment/Plan:  81 y.o. male is s/p Right CEA Day of Surgery   Neurologically intact Right neck incision with some mild oozing, soft without hematoma Will plan to resume Heparin IV tomorrow morning if no bleeding issues overnight PT/OT to eval  Anticipate d/c tomorrow if he does well overnight   Karoline Caldwell, PA-C [facility] [phone] 10/01/2022 3:27 PM

## 2022-10-01 NOTE — Progress Notes (Signed)
PROGRESS NOTE    ARREN LAMINACK  NAT:[id number] DOB: 1941-06-20 DOA: 09/26/2022 PCP: Biagio Borg, MD   Brief Narrative:  81 y.o. male with medical history significant of CAD, sick sinus syndrome status post pacemaker, hyperlipidemia, depression, hypertension, atrial fibrillation, COPD, obesity, GERD, BPH, alcohol use, CHF presented with left arm weakness and numbness.  He was found to have acute stroke secondary to symptomatic right ICA stenosis.  Neurology and vascular surgery consulted.  Vascular surgery planning for right carotid endarterectomy today.  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 MRI un able to be performed secondary to old pacemaker lead.  CTA no large vessel occlusion. -Management as per neurology: continue aspirin, statin, Zetia, hold off Xarelto for now. Heparin IV per stroke protocol in preparation for surgery.  Outpatient follow-up with neurology.  Neurology signed off on 09/29/2022 -Vascular surgery planning for carotid endarterectomy today on 10/01/2022 -PT recommended outpatient PT -Diet as per SLP recommendations -Echo showed EF of 55% in 9/223.  LDL 55, A1c 6.1.  Paroxysmal A-fib History of sick sinus syndrome status post pacemaker Chronic diastolic heart failure Coronary artery calcification seen on CAT scan Aortic atherosclerosis Hyperlipidemia -Currently rate controlled.  No chest pain.  Continue aspirin, statin, ezetimibe, flecainide.  Currently on heparin drip: Held for surgery this morning.  Resume Xarelto once cleared by vascular surgery.  Outpatient follow-up with cardiology  Hypertension -Blood pressure currently stable -Resume antihypertensives after permissive hypertension  WHO class III pulmonary hypertension -Due to chronic lung disease specially given the history  of COPD and left diaphragmatic paralysis diagnosed with RHC in 08/2022  COPD -Unspecified.  Currently stable.  Outpatient follow-up  BPH -Continue tamsulosin  Thrombocytopenia -Questionable cause.  Monitor intermittently  Diabetes mellitus type 2 controlled -Continue Jardiance.  A1c 6.1  Obesity -Outpatient follow-up    DVT prophylaxis: Heparin drip held for surgery this morning Code Status: Full Family Communication: None at bedside Disposition Plan: Status is: Inpatient Remains inpatient appropriate because: Of need for vascular surgery intervention today   Consultants: Neurology/vascular surgery  Procedures: Echo  Antimicrobials: None   Subjective: Patient seen and examined at bedside.  No chest pain, worsening shortness of breath, fever, or worsening weakness reported. Objective: Vitals:   09/30/22 2011 09/30/22 2353 10/01/22 0333 10/01/22 0453  BP: 121/84 132/87 123/80   Pulse: 77 69 69   Resp:  18 19   Temp: 98.2 F (36.8 C) 97.9 F (36.6 C) 98.4 F (36.9 C)   TempSrc: Oral Oral Oral   SpO2: (!) 86% 96% 96%   Weight:    92.9 kg    Intake/Output Summary (Last 24 hours) at 10/01/2022 0824 Last data filed at 09/30/2022 1700 Gross per 24 hour  Intake 900 ml  Output 400 ml  Net 500 ml    Filed Weights   09/29/22 1700 10/01/22 0453  Weight: 92.5 kg 92.9 kg    Examination:  General exam: No acute distress.  Currently on room air  respiratory system: Decreased breath sounds at bases bilaterally with scattered crackles  cardiovascular system: Rate mostly controlled; S1 and S2 are heard Gastrointestinal system: Abdomen is distended slightly, soft and nontender.  Bowel sounds are heard Extremities: Mild lower extremity edema present; no clubbing   Data Reviewed: I have personally reviewed following labs and imaging studies  CBC: Recent Labs  Lab 09/26/22 1119 09/28/22  9242 09/29/22 0400 09/30/22 0312 10/01/22 0617  WBC 7.7 8.1 7.7 8.9 7.4   NEUTROABS 5.2  --   --   --   --   HGB 15.4 14.3 14.5 14.2 14.7  HCT 47.6 42.8 44.7 42.9 45.5  MCV 94.1 93.0 94.1 92.9 93.6  PLT 152 142* 147* 136* 147*    Basic Metabolic Panel: Recent Labs  Lab 09/26/22 1119 09/28/22 0441 10/01/22 0617  NA 142 142 141  K 3.9 3.9 3.9  CL 106 110 105  CO2 26 23 21*  GLUCOSE 108* 105* 103*  BUN '16 15 12  '$ CREATININE 0.98 1.15 1.04  CALCIUM 9.6 9.1 9.4  MG  --   --  1.9    GFR: Estimated Creatinine Clearance: 64.8 mL/min (by C-G formula based on SCr of 1.04 mg/dL). Liver Function Tests: Recent Labs  Lab 09/26/22 1119  AST 20  ALT 26  ALKPHOS 67  BILITOT 1.0  PROT 7.5  ALBUMIN 4.4    No results for input(s): "LIPASE", "AMYLASE" in the last 168 hours. No results for input(s): "AMMONIA" in the last 168 hours. Coagulation Profile: Recent Labs  Lab 09/26/22 1119  INR 2.8*    Cardiac Enzymes: No results for input(s): "CKTOTAL", "CKMB", "CKMBINDEX", "TROPONINI" in the last 168 hours. BNP (last 3 results) Recent Labs    05/13/22 1216 06/01/22 1004  PROBNP 19.0 19.0    HbA1C: No results for input(s): "HGBA1C" in the last 72 hours.  CBG: Recent Labs  Lab 09/26/22 1109 09/27/22 1148  GLUCAP 87 163*    Lipid Profile: No results for input(s): "CHOL", "HDL", "LDLCALC", "TRIG", "CHOLHDL", "LDLDIRECT" in the last 72 hours.  Thyroid Function Tests: No results for input(s): "TSH", "T4TOTAL", "FREET4", "T3FREE", "THYROIDAB" in the last 72 hours. Anemia Panel: No results for input(s): "VITAMINB12", "FOLATE", "FERRITIN", "TIBC", "IRON", "RETICCTPCT" in the last 72 hours. Sepsis Labs: No results for input(s): "PROCALCITON", "LATICACIDVEN" in the last 168 hours.  Recent Results (from the past 240 hour(s))  Surgical pcr screen     Status: None   Collection Time: 09/30/22 10:19 PM   Specimen: Nasal Mucosa; Nasal Swab  Result Value Ref Range Status   MRSA, PCR NEGATIVE NEGATIVE Final   Staphylococcus aureus NEGATIVE NEGATIVE  Final    Comment: (NOTE) The Xpert SA Assay (FDA approved for NASAL specimens in patients 65 years of age and older), is one component of a comprehensive surveillance program. It is not intended to diagnose infection nor to guide or monitor treatment. Performed at [facility], [place] [address], [place], [place] [address]          Radiology Studies: No results found.      Scheduled Meds:  [MAR Hold] aspirin EC  81 mg Oral Daily   [MAR Hold] atorvastatin  80 mg Oral Daily   [MAR Hold] empagliflozin  10 mg Oral QAC breakfast   [MAR Hold] ezetimibe  10 mg Oral Daily   [MAR Hold] flecainide  100 mg Oral BID   [MAR Hold] loratadine  10 mg Oral Daily   [MAR Hold] pantoprazole  40 mg Oral Daily   [MAR Hold] sodium chloride flush  3 mL Intravenous Once   [MAR Hold] tamsulosin  0.4 mg Oral BID   Continuous Infusions:  clevidipine     heparin 700 Units/hr (10/01/22 0327)   remifentanil (ULTIVA) 2 mg in 100 mL normal saline (20 mcg/mL) Optime            Aline August, MD  [facility] 10/01/2022, 8:24 AM

## 2022-10-01 NOTE — Progress Notes (Signed)
[facility] [place] PROGRESS NOTE  ASSESSMENT / PLAN: [name] is a 81 y.o. male with symptomatic right carotid artery stenosis. Plan right carotid endarterectomy today in OR. All questions answered. We again reviewed risks of stroke, CN injury, hematoma. He is understanding and wants to proceed.    SUBJECTIVE: All questions answered. No interval events.  OBJECTIVE: BP 123/80 (BP Location: Right Arm)   Pulse 69   Temp 98.4 F (36.9 C) (Oral)   Resp 19   Wt 92.9 kg   SpO2 96%   BMI 30.24 kg/m   Intake/Output Summary (Last 24 hours) at 10/01/2022 0714 Last data filed at 09/30/2022 1700 Gross per 24 hour  Intake 995.64 ml  Output 400 ml  Net 595.64 ml    No acute distress. Regular rate and rhythm Unlabored breathing Subtle left arm weakness     Latest Ref Rng & Units 10/01/2022    6:17 AM 09/30/2022    3:12 AM 09/29/2022    4:00 AM  CBC  WBC 4.0 - 10.5 K/uL 7.4  8.9  7.7   Hemoglobin 13.0 - 17.0 g/dL 14.7  14.2  14.5   Hematocrit 39.0 - 52.0 % 45.5  42.9  44.7   Platelets 150 - 400 K/uL 147  136  147         Latest Ref Rng & Units 10/01/2022    6:17 AM 09/28/2022    4:41 AM 09/26/2022   11:19 AM  CMP  Glucose 70 - 99 mg/dL 103  105  108   BUN 8 - 23 mg/dL '12  15  16   '$ Creatinine 0.61 - 1.24 mg/dL 1.04  1.15  0.98   Sodium 135 - 145 mmol/L 141  142  142   Potassium 3.5 - 5.1 mmol/L 3.9  3.9  3.9   Chloride 98 - 111 mmol/L 105  110  106   CO2 22 - 32 mmol/L '21  23  26   '$ Calcium 8.9 - 10.3 mg/dL 9.4  9.1  9.6   Total Protein 6.5 - 8.1 g/dL   7.5   Total Bilirubin 0.3 - 1.2 mg/dL   1.0   Alkaline Phos 38 - 126 U/L   67   AST 15 - 41 U/L   20   ALT 0 - 44 U/L   26     Estimated Creatinine Clearance: 64.8 mL/min (by C-G formula based on SCr of 1.04 mg/dL).  Yevonne Aline. Stanford Breed, MD [facility] Phone Number: [phone] 10/01/2022 7:14 AM

## 2022-10-02 ENCOUNTER — Encounter (HOSPITAL_COMMUNITY): Payer: Self-pay | Admitting: Vascular Surgery

## 2022-10-02 ENCOUNTER — Other Ambulatory Visit (HOSPITAL_COMMUNITY): Payer: Self-pay

## 2022-10-02 LAB — LIPID PANEL
Cholesterol: 116 mg/dL (ref 0–200)
HDL: 52 mg/dL (ref >40)
LDL Cholesterol: 55 mg/dL (ref 0–99)
Total CHOL/HDL Ratio: 2.2 RATIO
Triglycerides: 45 mg/dL (ref <150)
VLDL: 9 mg/dL (ref 0–40)

## 2022-10-02 LAB — CBC
HCT: 39.8 % (ref 39.0–52.0)
Hemoglobin: 13 g/dL (ref 13.0–17.0)
MCH: 30.7 pg (ref 26.0–34.0)
MCHC: 32.7 g/dL (ref 30.0–36.0)
MCV: 93.9 fL (ref 80.0–100.0)
Platelets: 129 10*3/uL — ABNORMAL LOW (ref 150–400)
RBC: 4.24 MIL/uL (ref 4.22–5.81)
RDW: 13.2 % (ref 11.5–15.5)
WBC: 13.2 10*3/uL — ABNORMAL HIGH (ref 4.0–10.5)
nRBC: 0 % (ref 0.0–0.2)

## 2022-10-02 LAB — BASIC METABOLIC PANEL
Anion gap: 13 (ref 5–15)
BUN: 14 mg/dL (ref 8–23)
CO2: 21 mmol/L — ABNORMAL LOW (ref 22–32)
Calcium: 9.2 mg/dL (ref 8.9–10.3)
Chloride: 107 mmol/L (ref 98–111)
Creatinine, Ser: 1.13 mg/dL (ref 0.61–1.24)
GFR, Estimated: >60 mL/min (ref >60)
Glucose, Bld: 136 mg/dL — ABNORMAL HIGH (ref 70–99)
Potassium: 4 mmol/L (ref 3.5–5.1)
Sodium: 141 mmol/L (ref 135–145)

## 2022-10-02 MED ORDER — TELMISARTAN 20 MG PO TABS: 20.0000 mg | ORAL_TABLET | Freq: Every day | ORAL | Status: DC

## 2022-10-02 MED ORDER — MECLIZINE HCL 12.5 MG PO TABS: 12.5000 mg | ORAL_TABLET | Freq: Three times a day (TID) | ORAL | Status: DC | PRN

## 2022-10-02 MED ORDER — AMLODIPINE BESYLATE 5 MG PO TABS: 5.0000 mg | ORAL_TABLET | Freq: Every day | ORAL | Status: DC

## 2022-10-02 MED ORDER — ASPIRIN 81 MG PO TBEC: 81.0000 mg | DELAYED_RELEASE_TABLET | Freq: Every day | ORAL | 0 refills | Status: DC

## 2022-10-02 NOTE — Plan of Care (Signed)
Problem: Education: Goal: Understanding of CV disease, CV risk reduction, and recovery process will improve Outcome: Adequate for Discharge Goal: Individualized Educational Video(s) Outcome: Adequate for Discharge   Problem: Activity: Goal: Ability to return to baseline activity level will improve Outcome: Adequate for Discharge   Problem: Cardiovascular: Goal: Ability to achieve and maintain adequate cardiovascular perfusion will improve Outcome: Adequate for Discharge Goal: Vascular access site(s) Level 0-1 will be maintained Outcome: Adequate for Discharge   Problem: Health Behavior/Discharge Planning: Goal: Ability to safely manage health-related needs after discharge will improve Outcome: Adequate for Discharge   Problem: Education: Goal: Knowledge of disease or condition will improve Outcome: Adequate for Discharge Goal: Knowledge of secondary prevention will improve (MUST DOCUMENT ALL) Outcome: Adequate for Discharge Goal: Knowledge of patient specific risk factors will improve [name] N/A or DELETE if not current risk factor) Outcome: Adequate for Discharge   Problem: Ischemic Stroke/TIA Tissue Perfusion: Goal: Complications of ischemic stroke/TIA will be minimized Outcome: Adequate for Discharge   Problem: Coping: Goal: Will verbalize positive feelings about self Outcome: Adequate for Discharge Goal: Will identify appropriate support needs Outcome: Adequate for Discharge   Problem: Health Behavior/Discharge Planning: Goal: Ability to manage health-related needs will improve Outcome: Adequate for Discharge Goal: Goals will be collaboratively established with patient/family Outcome: Adequate for Discharge   Problem: Self-Care: Goal: Ability to participate in self-care as condition permits will improve Outcome: Adequate for Discharge Goal: Verbalization of feelings and concerns over difficulty with self-care will improve Outcome: Adequate for Discharge Goal:  Ability to communicate needs accurately will improve Outcome: Adequate for Discharge   Problem: Nutrition: Goal: Risk of aspiration will decrease Outcome: Adequate for Discharge Goal: Dietary intake will improve Outcome: Adequate for Discharge   Problem: Acute Rehab OT Goals (only OT should resolve) Goal: Pt. Will Transfer To Toilet Outcome: Adequate for Discharge Goal: Pt/Caregiver Will Perform Home Exercise Program Outcome: Adequate for Discharge Goal: OT Additional ADL Goal #1 Outcome: Adequate for Discharge   Problem: SLP Cognition Goals Goal: Patient will utilize external memory aids Description: Patient will utilize external memory aids to facilitate recall of information for improved safety with Outcome: Adequate for Discharge Goal: Patient will demonstrate problem solving skills Description: Patient will demonstrate problem solving skills during functional ADL's with Outcome: Adequate for Discharge   Problem: Acute Rehab PT Goals(only PT should resolve) Goal: Pt Will Go Supine/Side To Sit Outcome: Adequate for Discharge Goal: Pt Will Transfer Bed To Chair/Chair To Bed Outcome: Adequate for Discharge Goal: Pt Will Perform Standing Balance Or Pre-Gait Outcome: Adequate for Discharge Goal: Pt Will Ambulate Outcome: Adequate for Discharge Goal: Pt Will Go Up/Down Stairs Outcome: Adequate for Discharge   Problem: Education: Goal: Knowledge of General Education information will improve Description: Including pain rating scale, medication(s)/side effects and non-pharmacologic comfort measures Outcome: Adequate for Discharge   Problem: Health Behavior/Discharge Planning: Goal: Ability to manage health-related needs will improve Outcome: Adequate for Discharge   Problem: Clinical Measurements: Goal: Ability to maintain clinical measurements within normal limits will improve Outcome: Adequate for Discharge Goal: Will remain free from infection Outcome: Adequate for  Discharge Goal: Diagnostic test results will improve Outcome: Adequate for Discharge Goal: Respiratory complications will improve Outcome: Adequate for Discharge Goal: Cardiovascular complication will be avoided Outcome: Adequate for Discharge   Problem: Activity: Goal: Risk for activity intolerance will decrease Outcome: Adequate for Discharge   Problem: Nutrition: Goal: Adequate nutrition will be maintained Outcome: Adequate for Discharge   Problem: Coping: Goal: Level of anxiety will decrease  Outcome: Adequate for Discharge   Problem: Elimination: Goal: Will not experience complications related to bowel motility Outcome: Adequate for Discharge Goal: Will not experience complications related to urinary retention Outcome: Adequate for Discharge   Problem: Pain Managment: Goal: General experience of comfort will improve Outcome: Adequate for Discharge   Problem: Safety: Goal: Ability to remain free from injury will improve Outcome: Adequate for Discharge   Problem: Skin Integrity: Goal: Risk for impaired skin integrity will decrease Outcome: Adequate for Discharge

## 2022-10-02 NOTE — Care Management Important Message (Signed)
Important Message  Patient Details  Name: [name] MRN: [id number] Date of Birth: 07/20/1941   Medicare Important Message Given:  Yes     Shelda Altes 10/02/2022, 9:23 AM

## 2022-10-02 NOTE — Discharge Summary (Signed)
Physician Discharge Summary  [name] RCV:[id number] DOB: Apr 11, 1941 DOA: 09/26/2022  PCP: Biagio Borg, MD  Admit date: 09/26/2022 Discharge date: 10/02/2022  Admitted From: Home Disposition: Home  Recommendations for Outpatient Follow-up:  Follow up with PCP in 1 week with repeat CBC/BMP Outpatient follow-up with neurology and vascular surgery Follow up in ED if symptoms worsen or new appear   Home Health: No Equipment/Devices: None  Discharge Condition: Stable CODE STATUS: Full Diet recommendation: Heart healthy/carb modified  Brief/Interim Summary: 81 y.o. male with medical history significant of CAD, sick sinus syndrome status post pacemaker, hyperlipidemia, depression, hypertension, atrial fibrillation, COPD, obesity, GERD, BPH, alcohol use, CHF presented with left arm weakness and numbness.  He was found to have acute stroke secondary to symptomatic right ICA stenosis.  Neurology and vascular surgery consulted.  Vascular surgery performed right carotid endarterectomy on 10/01/2022.  He has been cleared for discharge by vascular surgery.  Discharge patient home today.  Outpatient follow-up with PCP/neurology/vascular surgery.  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 MRI un able to be performed secondary to old pacemaker lead.  CTA no large vessel occlusion. -Management as per neurology: continue aspirin, statin, Zetia.  Was treated with IV heparin in preparation for vascular surgery.  Outpatient follow-up with neurology.  Neurology signed off on 09/29/2022 -PT recommended outpatient PT -Diet as per SLP recommendations -Echo showed EF of 55% in 9/223.  LDL 55, A1c 6.1. -Vascular surgery performed right carotid endarterectomy on 10/01/2022.  He has been cleared for discharge by vascular  surgery.  Discharge patient home today.  Xarelto will be resumed on discharge since vascular surgery has given clearance. -Discharge home on aspirin, Xarelto, statin and Zetia.   Paroxysmal A-fib History of sick sinus syndrome status post pacemaker Chronic diastolic heart failure Coronary artery calcification seen on CAT scan Aortic atherosclerosis Hyperlipidemia -Currently rate controlled.  No chest pain.  Continue aspirin, statin, ezetimibe, flecainide. Resume Xarelto.  Outpatient follow-up with cardiology   Hypertension -Blood pressure currently stable -Allowed for permissive hypertension -Resume antihypertensives from 10/06/2022 onwards.  WHO class III pulmonary hypertension -Due to chronic lung disease specially given the history of COPD and left diaphragmatic paralysis diagnosed with RHC in 08/2022   COPD -Unspecified.  Currently stable.  Outpatient follow-up   BPH -Continue tamsulosin   Diabetes mellitus type 2 controlled -Continue Jardiance.  A1c 6.1.  Carb modified diet.  Outpatient follow-up.   Obesity -Outpatient follow-up    Discharge Instructions  Discharge Instructions     Ambulatory referral to Neurology   Complete by: As directed    Follow up with Dr. Berdine Addison at [facility] in 4 weeks. Pt is Dr. Cathey Endow pt. Thanks.   Ambulatory referral to Physical Therapy   Complete by: As directed    CAROTID Sugery: Call MD for difficulty swallowing or speaking; weakness in arms or legs that is a new symtom; severe headache.  If you have increased swelling in the neck and/or  are having difficulty breathing, CALL 911   Complete by: As directed    Call MD for:  redness, tenderness, or signs of infection (pain, swelling, bleeding, redness, odor or green/yellow discharge around incision site)   Complete by: As directed    Call MD for:  severe or increased pain, loss or decreased feeling  in affected limb(s)   Complete by: As directed  Call MD for:  temperature >100.5   Complete by:  As directed    Diet - low sodium heart healthy   Complete by: As directed    Discharge wound care:   Complete by: As directed    Keep dry for 24 hours. You can then shower as usual. Wash with mild soap and water, pat dry. Do not scrub. The steri strips on your incision will curl up and fall off over 7-10 days   Discharge wound care:   Complete by: As directed    As per vascular surgery recommendations   Driving Restrictions   Complete by: As directed    No driving for 1 week or while taking narcotic pain medication   Increase activity slowly   Complete by: As directed    Walk with assistance use walker or cane as needed   Increase activity slowly   Complete by: As directed    Lifting restrictions   Complete by: As directed    Ne heavy lifting, pushing, pulling > 10 lbs for 4 weeks      Allergies as of 10/02/2022   No Known Allergies      Medication List     STOP taking these medications    metoprolol succinate 25 MG 24 hr tablet Commonly known as: Toprol XL       TAKE these medications    acetaminophen 650 MG CR tablet Commonly known as: TYLENOL Take 1,300 mg by mouth in the morning and at bedtime.   amLODipine 5 MG tablet Commonly known as: NORVASC Take 1 tablet (5 mg total) by mouth daily. Resume home dose from 10/06/22 Start taking on: October 06, 2022 What changed:  additional instructions These instructions start on October 06, 2022. If you are unsure what to do until then, ask your doctor or other care provider.   aspirin EC 81 MG tablet Take 1 tablet (81 mg total) by mouth daily. Swallow whole. Start taking on: October 03, 2022   atorvastatin 80 MG tablet Commonly known as: LIPITOR TAKE 1 TABLET BY MOUTH EVERY DAY   Breztri Aerosphere 160-9-4.8 MCG/ACT Aero Generic drug: Budeson-Glycopyrrol-Formoterol 1 puffs twice per day   dorzolamide-timolol 2-0.5 % ophthalmic solution Commonly known as: COSOPT Place 1 drop into both eyes in the morning  and at bedtime.   empagliflozin 10 MG Tabs tablet Commonly known as: Jardiance Take 1 tablet (10 mg total) by mouth daily before breakfast.   ezetimibe 10 MG tablet Commonly known as: ZETIA Take 1 tablet (10 mg total) by mouth daily.   fexofenadine 180 MG tablet Commonly known as: ALLEGRA TAKE 1 TABLET (180 MG TOTAL) BY MOUTH DAILY. What changed: See the new instructions.   flecainide 100 MG tablet Commonly known as: TAMBOCOR TAKE 1 TABLET BY MOUTH TWICE A DAY   latanoprost 0.005 % ophthalmic solution Commonly known as: XALATAN Place 1 drop into both eyes at bedtime.   meclizine 12.5 MG tablet Commonly known as: ANTIVERT Take 1 tablet (12.5 mg total) by mouth 3 (three) times daily as needed for dizziness.   Metamucil 0.52 g capsule Generic drug: psyllium Take 2 capsules by mouth daily.   omeprazole 40 MG capsule Commonly known as: PRILOSEC TAKE 1 CAPSULE BY MOUTH EVERY DAY AS NEEDED What changed:  how much to take how to take this when to take this   tamsulosin 0.4 MG Caps capsule Commonly known as: FLOMAX TAKE 1 CAPSULE BY MOUTH TWICE A DAY What changed: when to take this  telmisartan 20 MG tablet Commonly known as: MICARDIS Take 1 tablet (20 mg total) by mouth at bedtime. Resume from 10/06/22 Start taking on: October 06, 2022 What changed:  additional instructions These instructions start on October 06, 2022. If you are unsure what to do until then, ask your doctor or other care provider.   traMADol 50 MG tablet Commonly known as: ULTRAM TAKE 1 TABLET BY MOUTH EVERY 6 HOURS AS NEEDED. FOR PAIN What changed:  reasons to take this additional instructions   Vitamin D3 50 MCG (2000 UT) Tabs Take 2,000 Units by mouth daily.   Xarelto 20 MG Tabs tablet Generic drug: rivaroxaban TAKE 1 TABLET BY MOUTH DAILY WITH SUPPER. What changed: how much to take               Discharge Care Instructions  (From admission, onward)           Start      Ordered   10/02/22 0000  Discharge wound care:       Comments: Keep dry for 24 hours. You can then shower as usual. Wash with mild soap and water, pat dry. Do not scrub. The steri strips on your incision will curl up and fall off over 7-10 days   10/02/22 1000   10/02/22 0000  Discharge wound care:       Comments: As per vascular surgery recommendations   10/02/22 1000            Follow-up Information     Shellia Carwin, MD. Schedule an appointment as soon as possible for a visit in 1 month(s).   Specialty: Neurology Contact information: [address] [place] [address] [place]  [address] [phone]         [facility] Follow up.   Why: 2-3 weeks. The office will call the patient with an appointment Contact information: [address] [place]        Biagio Borg, MD. Schedule an appointment as soon as possible for a visit in 1 week(s).   Specialties: Internal Medicine, Radiology Contact information: [place] [address] [phone]                No Known Allergies  Consultations: Neuro/vascular surgery   Procedures/Studies: CT ANGIO HEAD NECK W WO CM  Result Date: 09/27/2022 CLINICAL DATA:  Stroke, follow up EXAM: CT ANGIOGRAPHY HEAD AND NECK TECHNIQUE: Multidetector CT imaging of the head and neck was performed using the standard protocol during bolus administration of intravenous contrast. Multiplanar CT image reconstructions and MIPs were obtained to evaluate the vascular anatomy. Carotid stenosis measurements (when applicable) are obtained utilizing NASCET criteria, using the distal internal carotid diameter as the denominator. RADIATION DOSE REDUCTION: This exam was performed according to the departmental dose-optimization program which includes automated exposure control, adjustment of the mA and/or kV according to patient size and/or use of iterative reconstruction  technique. CONTRAST:  28m OMNIPAQUE IOHEXOL 350 MG/ML SOLN COMPARISON:  None Available. FINDINGS: CT HEAD FINDINGS Brain: Hypoattenuation and loss of gray differentiation in the high right posterior frontal lobe, suspicious for acute infarct. No acute hemorrhage. Patchy white matter hypodensities, nonspecific but compatible with chronic microvascular ischemic disease. No evidence of mass lesion, midline shift, or hydrocephalus. Vascular: Calcific atherosclerosis. Skull: No acute fracture. Sinuses/Orbits: No acute fracture. Other: No mastoid effusions. Review of the MIP images confirms the above findings CTA NECK FINDINGS Aortic arch: Great vessel origins are patent. Without  significant stenosis. Right carotid system: Atherosclerosis at the carotid bifurcation with proximally 50% stenosis. Retropharyngeal course of the ICA. Left carotid system: Atherosclerosis at the carotid bifurcation without greater than 50% stenosis. Retropharyngeal course of the ICA. Vertebral arteries: Right dominant. The non dominant left vertebral artery is small throughout its course. Both vertebral arteries are patent without significant stenosis. Skeleton: Chronic widening of the atlantodental interval (up to 6 mm), suggestive of instability/laxity. Severe multilevel degenerative change. Other neck: No acute findings. Upper chest: No acute findings. Review of the MIP images confirms the above findings CTA HEAD FINDINGS Anterior circulation: Bilateral intracranial ICAs, MCAs, and ACAs are patent without proximal hemodynamically significant stenosis. The low Posterior circulation: Small/non dominant left vertebral artery makes minimal contribution to the basilar artery, anatomic variant. The dominant right vertebral artery, basilar artery and bilateral posterior cerebral arteries are patent without proximal hemodynamically significant stenosis. Venous sinuses: As permitted by contrast timing, patent. Anatomic variants: Detailed above.  Review of the MIP images confirms the above findings IMPRESSION: CT head: 1. Hypoattenuation and loss of gray differentiation in the high right posterior frontal lobe, suspicious for acute infarct. Recommend MRI to further assess if the patient is able. 2. No acute hemorrhage. CTA: 1. No emergent large vessel occlusion. 2. 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 3. Chronic widening of the atlantodental interval (up to 6 mm), suggestive of instability/laxity. Electronically Signed   By: Margaretha Sheffield M.D.   On: 09/27/2022 17:11   CT HEAD WO CONTRAST  Result Date: 09/26/2022 CLINICAL DATA:  Weakness, decreased sensation to left arm EXAM: CT HEAD WITHOUT CONTRAST TECHNIQUE: Contiguous axial images were obtained from the base of the skull through the vertex without intravenous contrast. RADIATION DOSE REDUCTION: This exam was performed according to the departmental dose-optimization program which includes automated exposure control, adjustment of the mA and/or kV according to patient size and/or use of iterative reconstruction technique. COMPARISON:  CT brain, 11/04/2021 CT cervical spine, 09/06/2019 FINDINGS: Brain: No evidence of acute infarction, hemorrhage, hydrocephalus, extra-axial collection or mass lesion/mass effect. Periventricular and deep white matter hypodensity. Vascular: No hyperdense vessel or unexpected calcification. Skull: Normal. Negative for fracture or focal lesion. Sinuses/Orbits: No acute finding. Unchanged chronic fracture deformity of the medial inferior floor of the left orbit (series 4, image 56). Other: Unchanged, very central appearing position of the dens with respect to the foramen magnum, which appears to deflect the lower brainstem and upper cervical spinal cord (series 5, image 31). IMPRESSION: 1. No acute intracranial pathology. Small-vessel white matter disease. Consider MRI to more sensitively evaluate for acute diffusion restricting infarction if  suspected. 2. Unchanged, very central appearing position of the dens with respect to the foramen magnum, which appears to deflect the lower brainstem and upper cervical spinal cord. This appearance is as on dedicated examination of the cervical spine dated 09/06/2019. Electronically Signed   By: Delanna Ahmadi M.D.   On: 09/26/2022 11:14   CARDIAC CATHETERIZATION  Result Date: 09/09/2022 1.  Mild pulmonary hypertension with a mean PA pressure of 28 mmHg. 2.  Mean RA pressure of 8 mmHg, RV pressure of 43/6 with RV end-diastolic pressure of 12 mmHg, mean wedge pressure of 7 mmHg, and PA pressure 45/24 with a mean of 28 mmHg. 3.  Fick cardiac output of 4.28 L/min and Fick cardiac index of 2.05 L/min/m. 4.  PVR of 4.75 Woods units. 5.  Given mean PA pressure of greater than 20, wedge pressure of less than 15  mmHg, and pulmonary vascular resistance of greater than 2 Woods units this likely represents 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 especially given the patient's history of COPD and left diaphragmatic paralysis. Recommendation: Continue current therapy.      Subjective: Patient seen and examined at bedside.  Feels well to go home today.  No fever, vomiting or worsening shortness of breath reported.  Discharge Exam: Vitals:   10/02/22 0652 10/02/22 0800  BP: (!) 137/91 (!) 135/91  Pulse: 92 66  Resp: 18 17  Temp: 98.4 F (36.9 C) 98.1 F (36.7 C)  SpO2:  100%    General: Pt is alert, awake, not in acute distress.  On room air. Cardiovascular: rate controlled, S1/S2 + Respiratory: bilateral decreased breath sounds at bases Abdominal: Soft, NT, ND, bowel sounds + Extremities: Trace lower extremity edema present; no cyanosis    The results of significant diagnostics from this hospitalization (including imaging, microbiology, ancillary and laboratory) are listed below for reference.     Microbiology: Recent Results (from the past 240 hour(s))  Surgical pcr  screen     Status: None   Collection Time: 09/30/22 10:19 PM   Specimen: Nasal Mucosa; Nasal Swab  Result Value Ref Range Status   MRSA, PCR NEGATIVE NEGATIVE Final   Staphylococcus aureus NEGATIVE NEGATIVE Final    Comment: (NOTE) The Xpert SA Assay (FDA approved for NASAL specimens in patients 68 years of age and older), is one component of a comprehensive surveillance program. It is not intended to diagnose infection nor to guide or monitor treatment. Performed at [facility], [place] [address] [place], [place] [address]      Labs: BNP (last 3 results) Recent Labs    05/18/22 2306  BNP 62.1   Basic Metabolic Panel: Recent Labs  Lab 09/26/22 1119 09/28/22 0441 10/01/22 0617 10/01/22 0807 10/02/22 0420  NA 142 142 141 140 141  K 3.9 3.9 3.9 3.8 4.0  CL 106 110 105  --  107  CO2 26 23 21*  --  21*  GLUCOSE 108* 105* 103*  --  136*  BUN '16 15 12  '$ --  14  CREATININE 0.98 1.15 1.04  --  1.13  CALCIUM 9.6 9.1 9.4  --  9.2  MG  --   --  1.9  --   --    Liver Function Tests: Recent Labs  Lab 09/26/22 1119  AST 20  ALT 26  ALKPHOS 67  BILITOT 1.0  PROT 7.5  ALBUMIN 4.4   No results for input(s): "LIPASE", "AMYLASE" in the last 168 hours. No results for input(s): "AMMONIA" in the last 168 hours. CBC: Recent Labs  Lab 09/26/22 1119 09/28/22 0441 09/29/22 0400 09/30/22 0312 10/01/22 0617 10/01/22 0807 10/02/22 0420  WBC 7.7 8.1 7.7 8.9 7.4  --  13.2*  NEUTROABS 5.2  --   --   --   --   --   --   HGB 15.4 14.3 14.5 14.2 14.7 13.6 13.0  HCT 47.6 42.8 44.7 42.9 45.5 40.0 39.8  MCV 94.1 93.0 94.1 92.9 93.6  --  93.9  PLT 152 142* 147* 136* 147*  --  129*   Cardiac Enzymes: No results for input(s): "CKTOTAL", "CKMB", "CKMBINDEX", "TROPONINI" in the last 168 hours. BNP: Invalid input(s): "POCBNP" CBG: Recent Labs  Lab 09/26/22 1109 09/27/22 1148  GLUCAP 87 163*   D-Dimer No results for input(s): "DDIMER" in the last 72 hours. Hgb A1c No  results for input(s): "HGBA1C" in the last 72 hours. Lipid Profile Recent Labs    10/02/22 0420  CHOL 116  HDL 52  LDLCALC 55  TRIG 45  CHOLHDL 2.2   Thyroid function studies No results for input(s): "TSH", "T4TOTAL", "T3FREE", "THYROIDAB" in the last 72 hours.  Invalid input(s): "FREET3" Anemia work up No results for input(s): "VITAMINB12", "FOLATE", "FERRITIN", "TIBC", "IRON", "RETICCTPCT" in the last 72 hours. Urinalysis    Component Value Date/Time   COLORURINE YELLOW 06/01/2022 1004   APPEARANCEUR CLEAR 06/01/2022 1004   LABSPEC 1.010 06/01/2022 1004   PHURINE 6.5 06/01/2022 1004   GLUCOSEU NEGATIVE 06/01/2022 1004   HGBUR NEGATIVE 06/01/2022 1004   BILIRUBINUR NEGATIVE 06/01/2022 1004   KETONESUR NEGATIVE 06/01/2022 1004   PROTEINUR NEGATIVE 05/19/2022 0725   UROBILINOGEN 0.2 06/01/2022 1004   NITRITE NEGATIVE 06/01/2022 1004   LEUKOCYTESUR NEGATIVE 06/01/2022 1004   Sepsis Labs Recent Labs  Lab 09/29/22 0400 09/30/22 0312 10/01/22 0617 10/02/22 0420  WBC 7.7 8.9 7.4 13.2*   Microbiology Recent Results (from the past 240 hour(s))  Surgical pcr screen     Status: None   Collection Time: 09/30/22 10:19 PM   Specimen: Nasal Mucosa; Nasal Swab  Result Value Ref Range Status   MRSA, PCR NEGATIVE NEGATIVE Final   Staphylococcus aureus NEGATIVE NEGATIVE Final    Comment: (NOTE) The Xpert SA Assay (FDA approved for NASAL specimens in patients 56 years of age and older), is one component of a comprehensive surveillance program. It is not intended to diagnose infection nor to guide or monitor treatment. Performed at [facility], [place] [address], [place], [place] [address]      Time coordinating discharge: 35 minutes  SIGNED:   Aline August, MD  [facility] 10/02/2022, 10:00 AM

## 2022-10-02 NOTE — Progress Notes (Signed)
  Progress Note    10/02/2022 8:06 AM 1 Day Post-Op  Subjective:  no complaints. Sitting up in chair. Tolerating diet. Voiding without difficulty. Has ambulated without issues   Vitals:   10/02/22 0652 10/02/22 0800  BP: (!) 137/91 (!) 135/91  Pulse: 92 66  Resp: 18 17  Temp: 98.4 F (36.9 C) 98.1 F (36.7 C)  SpO2:  100%   Physical Exam: Cardiac:  regular Lungs:  non labored Incisions:  right neck incision is clean, dry and intact. Steri strips present. Soft, no swelling or hematoma Extremities:  moving all extremities without deficits Neurologic: alert and oriented, smile symmetric, speech coherent  CBC    Component Value Date/Time   WBC 13.2 (H) 10/02/2022 0420   RBC 4.24 10/02/2022 0420   HGB 13.0 10/02/2022 0420   HGB 14.9 09/03/2022 1229   HCT 39.8 10/02/2022 0420   HCT 43.9 09/03/2022 1229   PLT 129 (L) 10/02/2022 0420   PLT 159 09/03/2022 1229   MCV 93.9 10/02/2022 0420   MCV 91 09/03/2022 1229   MCH 30.7 10/02/2022 0420   MCHC 32.7 10/02/2022 0420   RDW 13.2 10/02/2022 0420   RDW 13.9 09/03/2022 1229   LYMPHSABS 1.6 09/26/2022 1119   LYMPHSABS 2.1 09/18/2020 1549   MONOABS 0.8 09/26/2022 1119   EOSABS 0.0 09/26/2022 1119   EOSABS 0.1 09/18/2020 1549   BASOSABS 0.0 09/26/2022 1119   BASOSABS 0.1 09/18/2020 1549    BMET    Component Value Date/Time   NA 141 10/02/2022 0420   NA 140 09/03/2022 1229   K 4.0 10/02/2022 0420   CL 107 10/02/2022 0420   CO2 21 (L) 10/02/2022 0420   GLUCOSE 136 (H) 10/02/2022 0420   GLUCOSE 86 10/25/2006 0935   BUN 14 10/02/2022 0420   BUN 16 09/03/2022 1229   CREATININE 1.13 10/02/2022 0420   CREATININE 1.03 12/11/2015 0912   CALCIUM 9.2 10/02/2022 0420   GFRNONAA >60 10/02/2022 0420   GFRAA 89 09/18/2020 1549    INR    Component Value Date/Time   INR 2.8 (H) 09/26/2022 1119     Intake/Output Summary (Last 24 hours) at 10/02/2022 0806 Last data filed at 10/02/2022 0400 Gross per 24 hour  Intake  2701.83 ml  Output 1825 ml  Net 876.83 ml     Assessment/Plan:  81 y.o. male is s/p Right CEA 1 Day Post-Op   Neurologically intact Feels LUE is at 90%. Improving Right neck incision is clean, dry and intact. No swelling or hematoma Okay to restart Rivaroxaban Stable for discharge from vascular standpoint Will arrange outpatient follow up in 2-3 weeks  Karoline Caldwell, PA-C [facility] [phone] 10/02/2022 8:06 AM

## 2022-10-02 NOTE — TOC Benefit Eligibility Note (Signed)
Patient Teacher, English as a foreign language completed.    The patient is currently admitted and upon discharge could be taking Jardiance 10 mg.  The current 30 day co-pay is $200.00.   The patient is insured through [employer] Medicare part D     Lyndel Safe, [place] Patient Advocate Specialist [place] Patient Advocate Team Direct Number: [phone]  Fax: [phone]

## 2022-10-02 NOTE — Progress Notes (Addendum)
Pt is hemodynamically stable, afebrile, pain is well tolerated. He is able to rest well tonight. Right neck original dry dressing is dry and clean, negative for bleeding or hematoma. Neurological signs is checked per protocol. The NIHSS assessment is noted below that Pt has had left arm mild weakness and decreased sensation prior this admission. Negative finding for new neurological deficits. MD aware. Pt is able to ambulate well tolerated with standby assisting. We will monitor.   10/02/22 0510  Neurological  Neuro (WDL) X  Orientation Level Oriented X4  Cognition Appropriate at baseline;Appropriate attention/concentration;Appropriate judgement;Appropriate safety awareness;Appropriate for developmental age;Follows commands;No memory impairment  Speech Clear;Appropriate for developmental age;Appropriate at baseline  Motor Function/Sensation Assessment Grip;Head;Elbow extension;Elbow flexion;Pronator drift;Dorsiflexion;Plantar flexion;Sensation;Motor strength;Motor response;Leg ataxia;Arm ataxia  Facial Symmetry Symmetrical  R Hand Grip Strong  L Hand Grip Strong  R Elbow Extension (Push/Biceps) Strong  L Elbow Extension (Push/Biceps) Strong  R Elbow Flexion (Pull/Triceps) Strong  L Elbow Flexion (Pull/Triceps) Strong  Right Pronator Drift Present  Left Pronator Drift Absent  R Foot Dorsiflexion Strong  L Foot Dorsiflexion Strong  R Foot Plantar Flexion Strong  L Foot Plantar Flexion Strong  R Finger to Nose (Point to [employer]) Smooth  L Finger to Nose (Point to [employer]) Smooth  R Heel to [employer] (Point to [employer]) Smooth  L Heel to [employer] to [employer]) Smooth  RUE Motor Response Purposeful movement  RUE Sensation Full sensation  RUE Motor Strength 5  LUE Motor Response Purposeful movement  LUE Sensation (S)  Decreased (left upper shoulder has decreased sensation)  LUE Motor Strength 5  RLE Motor Response Purposeful movement  RLE Sensation Full sensation  RLE Motor Strength 5  LLE Motor  Response Purposeful movement  LLE Sensation Full sensation  LLE Motor Strength 5  Neuro Symptoms None  Neuro Additional Assessments NIH stroke scale (+Modified stroke scale criteria)  Glasgow Coma Scale  Eye Opening 4  Best Verbal Response (NON-intubated) 5  Best Motor Response 6  Glasgow Coma Scale Score 15  NIH Stroke Scale ( + Modified Stroke Scale Criteria)   Interval Shift assessment  Level of Consciousness (1a.)    0  LOC Questions (1b. )   + 0  LOC Commands (1c. )   +  0  Best Gaze (2. )  + 0  Visual (3. )  + 0  Facial Palsy (4. )     0  Motor Arm, Left (5a. )   + (S)  1  Motor Arm, Right (5b. )   + 0  Motor Leg, Left (6a. )   + 0  Motor Leg, Right (6b. )   + 0  Limb Ataxia (7. ) 0  Sensory (8. )   + (S)  1  Best Language (9. )   + 0  Dysarthria (10. ) 0  Extinction/Inattention (11.)   + 0  Modified SS Total  + 2  Complete NIHSS TOTAL 2  Neurological  Level of Consciousness Alert   Kennyth Lose, RN

## 2022-10-02 NOTE — Progress Notes (Signed)
Occupational Therapy Treatment Patient Details Name: [name] MRN: [id number] DOB: 10-Mar-1941 Today's Date: 10/02/2022   History of present illness Pt is a 81 y/o male who presented with L sided weakness/numbness. CT head negative, unable to complete MRI due to pacemaker. 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 CEA completed on 11/16. PMH: CAD, SSS s/p PPM, HLD, depression, HTN, a fib, COPD, GERD, BPH, CHF   OT comments  Pt seen for first OT session since CEA with no new deficits noted. Pt able to mobilize and complete ADLs without assistance. L UE numbness/weakness persists though does not hinder pt's functional abilities. Reinforced UE HEP to further address L sided deficits. Pt reports his endurance is better than initial presentation with no SOB noted. DC recs remain appropriate and pt functionally appropriate for DC home once medically cleared.  HR 80-90s   Recommendations for follow up therapy are one component of a multi-disciplinary discharge planning process, led by the attending physician.  Recommendations may be updated based on patient status, additional functional criteria and insurance authorization.    Follow Up Recommendations  No OT follow up     Assistance Recommended at Discharge PRN  Patient can return home with the following  Assist for transportation   Equipment Recommendations  None recommended by OT    Recommendations for Other Services      Precautions / Restrictions Precautions Precautions: Fall Restrictions Weight Bearing Restrictions: No       Mobility Bed Mobility               General bed mobility comments: up in chair    Transfers Overall transfer level: Independent Equipment used: None Transfers: Sit to/from Stand Sit to Stand: Independent                 Balance Overall balance assessment: Mild deficits observed, not formally tested                                         ADL either performed or  assessed with clinical judgement   ADL Overall ADL's : Modified independent;Needs assistance/impaired     Grooming: Independent;Standing;Wash/dry face           Upper Body Dressing : Modified independent                   Functional mobility during ADLs: Supervision/safety General ADL Comments: Assessment s/p CEA w/ no new deficits noted and continued improvements in endurance/strength. Able to reach above to gather ADL items without LOB    Extremity/Trunk Assessment Upper Extremity Assessment Upper Extremity Assessment: LUE deficits/detail LUE Deficits / Details: continued numbness though improving, fair grasp. has tendency to drop items if not looking at what he is holding but able to hold phone fairly well LUE Sensation: decreased light touch LUE Coordination: decreased fine motor   Lower Extremity Assessment Lower Extremity Assessment: Defer to PT evaluation        Vision   Vision Assessment?: No apparent visual deficits   Perception     Praxis      Cognition Arousal/Alertness: Awake/alert Behavior During Therapy: WFL for tasks assessed/performed Overall Cognitive Status: Within Functional Limits for tasks assessed  Exercises      Shoulder Instructions       General Comments HR at rest 80s, 90s with activity    Pertinent Vitals/ Pain       Pain Assessment Pain Assessment: No/denies pain  Home Living                                          Prior Functioning/Environment              Frequency  Min 2X/week        Progress Toward Goals  OT Goals(current goals can now be found in the care plan section)  Progress towards OT goals: Progressing toward goals  Acute Rehab OT Goals Patient Stated Goal: home today, get back to fishing OT Goal Formulation: With patient Time For Goal Achievement: 10/12/22 Potential to Achieve Goals: Good ADL Goals Pt Will  Transfer to Toilet: Independently;ambulating Pt/caregiver will Perform Home Exercise Program: Increased strength;Left upper extremity;With theraband;With theraputty;Independently;With written HEP provided Additional ADL Goal #1: Pt to gather ADL/IADL items Independently without LOB or safety concerns  Plan Discharge plan remains appropriate    Co-evaluation                 AM-PAC OT "6 Clicks" Daily Activity     Outcome Measure   Help from another person eating meals?: None Help from another person taking care of personal grooming?: None Help from another person toileting, which includes using toliet, bedpan, or urinal?: None Help from another person bathing (including washing, rinsing, drying)?: A Little Help from another person to put on and taking off regular upper body clothing?: None Help from another person to put on and taking off regular lower body clothing?: None 6 Click Score: 23    End of Session    OT Visit Diagnosis: Muscle weakness (generalized) (M62.81)   Activity Tolerance Patient tolerated treatment well   Patient Left in chair;with call bell/phone within reach   Nurse Communication Mobility status        Time: 6010-9323 OT Time Calculation (min): 14 min  Charges: OT General Charges $OT Visit: 1 Visit OT Treatments $Therapeutic Activity: 8-22 mins  Malachy Chamber, OTR/L Acute Rehab Services Office: [phone]   Layla Maw 10/02/2022, 7:40 AM

## 2022-10-02 NOTE — Addendum Note (Signed)
Addendum  created 10/02/22 1245 by Brennan Bailey, MD   Clinical Note Signed, Intraprocedure Blocks edited

## 2022-10-02 NOTE — Progress Notes (Signed)
Physical Therapy Treatment Patient Details Name: [name] MRN: [id number] DOB: July 28, 1941 Today's Date: 10/02/2022   History of Present Illness Pt is a 81 y/o male who presented 09/26/22 with L sided weakness/numbness. CT head negative, unable to complete MRI due to pacemaker. 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 CEA completed on 11/16. PMH: CAD, SSS s/p PPM, HLD, depression, HTN, a fib, COPD, GERD, BPH, CHF    PT Comments    Pt is reporting improved sensation and being close to his baseline now s/p CEA 11/16. Did note symmetrical strength of grossly 5/5 with MMT in bil lower extremities and pt denied numbness/tingling in bil lower extremities. However, did not dysdiadochokinesia in his L lower extremity. He does continue to display some mild instability, usually having a lateral LOB to his L, but is able to recover without UE support or assistance. Current recommendations remain appropriate. Will continue to follow acutely.     Recommendations for follow up therapy are one component of a multi-disciplinary discharge planning process, led by the attending physician.  Recommendations may be updated based on patient status, additional functional criteria and insurance authorization.  Follow Up Recommendations  Outpatient PT     Assistance Recommended at Discharge Frequent or constant Supervision/Assistance  Patient can return home with the following A little help with bathing/dressing/bathroom;Assistance with cooking/housework;Assist for transportation;Help with stairs or ramp for entrance;A little help with walking and/or transfers   Equipment Recommendations  Rolling walker (2 wheels)    Recommendations for Other Services       Precautions / Restrictions Precautions Precautions: Fall Restrictions Weight Bearing Restrictions: No     Mobility  Bed Mobility               General bed mobility comments: Pt up in chair upon arrival.    Transfers Overall transfer  level: Independent Equipment used: None Transfers: Sit to/from Stand Sit to Stand: Independent           General transfer comment: No LOB, able to come to stand safely    Ambulation/Gait Ambulation/Gait assistance: Supervision, Min guard Gait Distance (Feet): 288 Feet Assistive device: None Gait Pattern/deviations: Step-through pattern, Decreased stride length, Drifts right/left, Knee flexed in stance - right, Decreased stance time - left Gait velocity: reduced Gait velocity interpretation: 1.31 - 2.62 ft/sec, indicative of limited community ambulator   General Gait Details: Pt ranging from min guard-supervision for safety as pt noted to have x3 minor trunk sways/LOB to L, but able to recover safely without assistance. Pt with noted very mild decreased L stance time and excess R lateral flexion and slight R knee flexion in stance phase, but pt reports he believes this is close to his baseline. Able to ambulate while changing speeds, directions and head positions without assistance   Stairs Stairs: Yes Stairs assistance: Supervision Stair Management: One rail Left, One rail Right, Alternating pattern, Forwards Number of Stairs: 10 General stair comments: Ascends with L rail and descends with R to simulate home set-up, no LOB but mildly slow to maintain balance, supervision for safety   Wheelchair Mobility    Modified Rankin (Stroke Patients Only) Modified Rankin (Stroke Patients Only) Pre-Morbid Rankin Score: Slight disability Modified Rankin: Moderate disability     Balance Overall balance assessment: Mild deficits observed, not formally tested  Cognition Arousal/Alertness: Awake/alert Behavior During Therapy: WFL for tasks assessed/performed Overall Cognitive Status: Within Functional Limits for tasks assessed                                 General Comments: Aware of his deficits         Exercises      General Comments General comments (skin integrity, edema, etc.): noted dysdiadochokinesia in L lower extremity, but MMT scores symmetrical and grossly 5 bil, denying numbness/tingling bil      Pertinent Vitals/Pain Pain Assessment Pain Assessment: Faces Faces Pain Scale: No hurt Pain Intervention(s): Monitored during session    Home Living                          Prior Function            PT Goals (current goals can now be found in the care plan section) Acute Rehab PT Goals Patient Stated Goal: to continue to improve PT Goal Formulation: With patient Time For Goal Achievement: 10/12/22 Potential to Achieve Goals: Good Progress towards PT goals: Progressing toward goals    Frequency    Min 3X/week      PT Plan Frequency needs to be updated    Co-evaluation              AM-PAC PT "6 Clicks" Mobility   Outcome Measure  Help needed turning from your back to your side while in a flat bed without using bedrails?: None Help needed moving from lying on your back to sitting on the side of a flat bed without using bedrails?: None Help needed moving to and from a bed to a chair (including a wheelchair)?: None Help needed standing up from a chair using your arms (e.g., wheelchair or bedside chair)?: None Help needed to walk in hospital room?: A Little Help needed climbing 3-5 steps with a railing? : A Little 6 Click Score: 22    End of Session   Activity Tolerance: Patient tolerated treatment well Patient left: in chair;with call bell/phone within reach Nurse Communication: Mobility status PT Visit Diagnosis: Other abnormalities of gait and mobility (R26.89);Unsteadiness on feet (R26.81)     Time: 4132-4401 PT Time Calculation (min) (ACUTE ONLY): 9 min  Charges:  $Gait Training: 8-22 mins                     Moishe Spice, PT, DPT Acute Rehabilitation Services  Office: [place] 10/02/2022, 8:27  AM

## 2022-10-05 ENCOUNTER — Telehealth: Payer: Self-pay

## 2022-10-05 NOTE — Patient Outreach (Signed)
  [facility] Care Coordination Follow Up  10/05/2022 Name:  [name] MRN:  [id number] DOB:  03/17/1941  Subjective: [name] is a 81 y.o. year old male who is a primary care patient of Biagio Borg, MD   An Emmi alert was received indicating patient responded to questions: Scheduled a follow-up appointment?.   I reached out by phone to follow up on the alert and spoke to Patient. Patient voices that he is doing fairly well. Denies any acute issues or concerns at present. Reviewed and addressed red alert. Patient confirms he has follow up appt with PCP on tomorrow. RN CM reviewed upcoming future appt per EMR with patient including neuro appt and rehab/PT appt. Patient voices he uses my-chart and gets notifications of appts. States wife will be taking him to appts. Denies any RN CM needs or concerns at this time.   Care Coordination Interventions Activated:  Yes  Care Coordination Interventions:  Yes, provided   Follow up plan: No further intervention required. Advised patient that they would continue to get automated EMMI-Stroke post discharge calls to assess how they are doing following recent hospitalization and will receive a call from a nurse if any of their responses were abnormal. Patient voiced understanding and was appreciative of f/u call.  Encounter Outcome:  Pt. Visit Completed   Enzo Montgomery, RN,BSN,CCM [place] [facility] Telephonic Care Management Coordinator Direct Phone: [phone] Toll Free: [phone] Fax: [phone]

## 2022-10-05 NOTE — Patient Outreach (Signed)
Received a red flag Emmi stroke notification for Mr. Troung. I have assigned Enzo Montgomery, RN to call for follow up and determine if there are any Case Management needs.    [facility], [place], [place] [facility] [phone]

## 2022-10-05 NOTE — Telephone Encounter (Signed)
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 "Fair" Any questions or concerns? Yes  Items Reviewed: Did the pt receive and understand the discharge instructions provided? Yes  Medications obtained and verified? Yes  Other? No  Any new allergies since your discharge? No  Dietary orders reviewed? Yes (Heart healthy, Carb Modified) Do you have support at home? Yes  (Wife)  [place] and Equipment/Supplies: Were home health services ordered? no If so, what is the name of the agency? no  Has the agency set up a time to come to the patient's home? no Were any new equipment or medical supplies ordered?  No What is the name of the medical supply agency? no Were you able to get the supplies/equipment? no Do you have any questions related to the use of the equipment or supplies? No  Functional Questionnaire: (I = Independent and D = Dependent) ADLs: I  Bathing/Dressing- I  Meal Prep- I  Eating- I  Maintaining continence- I  Transferring/Ambulation- I  Managing Meds- I  Follow up appointments reviewed:  PCP Hospital f/u appt confirmed? Yes  Scheduled to see Cathlean Cower, Md on 10/06/2022 @ 2:00pm. [facility] f/u appt confirmed? Yes  Scheduled to see Neurology. Are transportation arrangements needed? No  If their condition worsens, is the pt aware to call PCP or go to the Emergency Dept.? Yes Was the patient provided with contact information for the PCP's office or ED? Yes Was to pt encouraged to call back with questions or concerns? Yes  Markela [employer], [place] [facility] [place] [facility]  Site: [facility] [place]: [phone] fax Website: [email or web address] [facility]

## 2022-10-06 ENCOUNTER — Ambulatory Visit (INDEPENDENT_AMBULATORY_CARE_PROVIDER_SITE_OTHER): Payer: Medicare Other | Admitting: Internal Medicine

## 2022-10-06 ENCOUNTER — Encounter: Payer: Self-pay | Admitting: Internal Medicine

## 2022-10-06 VITALS — BP 128/76 | HR 61 | Temp 98.0°F | Ht 69.0 in | Wt 202.0 lb

## 2022-10-06 DIAGNOSIS — Z8673 Personal history of transient ischemic attack (TIA), and cerebral infarction without residual deficits: Secondary | ICD-10-CM | POA: Diagnosis not present

## 2022-10-06 DIAGNOSIS — Z7984 Long term (current) use of oral hypoglycemic drugs: Secondary | ICD-10-CM

## 2022-10-06 DIAGNOSIS — E119 Type 2 diabetes mellitus without complications: Secondary | ICD-10-CM

## 2022-10-06 MED ORDER — DAPAGLIFLOZIN PROPANEDIOL 5 MG PO TABS: 5.0000 mg | ORAL_TABLET | Freq: Every day | ORAL | 3 refills | Status: DC

## 2022-10-06 NOTE — Assessment & Plan Note (Signed)
Ok to try change jardiance to farxiga, but may need further change depending on cost, pt has access to let us know on mychart

## 2022-10-06 NOTE — Progress Notes (Signed)
Patient ID: [name], male   DOB: June 17, 1941, 81 y.o.   MRN: [id number]        Chief Complaint: follow up post hospn for acute CVA - Nov 11 - 17 2023       HPI:  [name] is a 81 y.o. male here with c/o presented with left arm weakness and numbness.  He was found to have acute stroke secondary to symptomatic right ICA stenosis.  Neurology and vascular surgery consulted.  Vascular surgery performed right carotid endarterectomy on 10/01/2022  Outpatient follow-up with PCP/neurology/vascular surgery.    Xarelto will be resumed on discharge since vascular surgery has given clearance. -Discharge home on aspirin, Xarelto, statin and Zetia.  Pt has appts in hand for f/u neurology and vascular surgury  Since being at home, has overall done well, no falls,  Pt denies polydipsia, polyuria, or new focal neuro s/s.   Good compliance with meds, though jardiance is $600 and cannot continue on this.   Transitional Care Management elements noted today: 1)  Date of D/C: as above 2)  Medication reconciliation:  done today at end visit 3)  Review of D/C summary or other information:  done today 4)  Review of need for f/u on pending diagnostic tests and treatments:  done today - none needed, f/u labs not felt necessary 5)  Review of need for Interaction with other providers who will assume or resume care of pt specific problems: done today - has appts scheduled 6)  Education of patient/family/guardian or caregiver: done today - wife and granddaughter  Wt Readings from Last 3 Encounters:  10/06/22 202 lb (91.6 kg)  10/02/22 199 lb 8.3 oz (90.5 kg)  09/09/22 204 lb (92.5 kg)   BP Readings from Last 3 Encounters:  10/06/22 128/76  10/02/22 (!) 135/91  09/09/22 130/87         Past Medical History:  Diagnosis Date   Abdominal pain, epigastric    Allergic rhinitis, cause unspecified    Arthritis    BPH (benign prostatic hyperplasia)    CHF (congestive heart failure) ([place])    Chronic anticoagulation     xarelto   Cognitive communication deficit 09/29/2022   Coronary artery calcification seen on CAT scan 12/26/2012   Depressive disorder, not elsewhere classified    Diabetes mellitus treated with oral medication ([place]) 09/28/2022   Diverticulosis of colon (without mention of hemorrhage)    Esophageal reflux    High risk medication use    Hyperlipidemia    Hyperplastic rectal polyp    Hypertension    Lumbago    Pacemaker    Paralyzed hemidiaphragm    LEFT   Persistent atrial fibrillation ([place])    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 ([place]) 09/29/2022   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 especially given the patient's history of COPD and 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   Sick sinus syndrome ([place]) 05/16/2009   s/p PPM ([employer]) by [employer]   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 ([place]) 09/28/2022   Past Surgical History:  Procedure Laterality Date   CYSTOSCOPY WITH RETROGRADE PYELOGRAM, URETEROSCOPY AND STENT PLACEMENT Left 12/30/2021   Procedure: CYSTOSCOPY WITH RETROGRADE PYELOGRAM, URETEROSCOPY;  Surgeon: Remi Haggard, MD;  Location: [facility];  Service: Urology;  Laterality: Left;   ENDARTERECTOMY Right 10/01/2022   Procedure: ENDARTERECTOMY CAROTID;  Surgeon: Cherre Robins, MD;  Location: [place];  Service: Vascular;  Laterality: Right;  ESOPHAGOGASTRODUODENOSCOPY N/A 12/05/2013   Procedure: ESOPHAGOGASTRODUODENOSCOPY (EGD);  Surgeon: [name], MD;  Location: [facility];  Service: Endoscopy;  Laterality: N/A;   ESOPHAGOGASTRODUODENOSCOPY N/A 07/04/2015   Procedure: ESOPHAGOGASTRODUODENOSCOPY (EGD);  Surgeon: [name], MD;  Location: [place];  Service: Endoscopy;  Laterality: N/A;   EXTRACORPOREAL SHOCK WAVE LITHOTRIPSY Left 10/13/2021   Procedure: EXTRACORPOREAL SHOCK WAVE LITHOTRIPSY (ESWL);  Surgeon: Lucas Mallow, MD;  Location: [facility];  Service:  Urology;  Laterality: Left;  90 MINS   INSERT / REPLACE / REMOVE PACEMAKER  05/2009    [employer] [id number] 30  (serial number [id number]) dual-chamber pacemaker   LEAD REVISION/REPAIR N/A 10/15/2020   Procedure: LEAD REVISION/REPAIR;  Surgeon: Thompson Grayer, MD;  Location: [place] [facility];  Service: Cardiovascular;  Laterality: N/A;   PACEMAKER PLACEMENT Bilateral    '05   PATCH ANGIOPLASTY Right 10/01/2022   Procedure: RIGHT CAROTID ARTERY PATCH ANGIOPLASTY WITH 1CM x 6CM XENOSURE BIOLOGIC PATCH;  Surgeon: Cherre Robins, MD;  Location: [place];  Service: Vascular;  Laterality: Right;   PPM GENERATOR CHANGEOUT N/A 10/15/2020   Procedure: PPM GENERATOR CHANGEOUT;  Surgeon: Thompson Grayer, MD;  Location: [place] [facility];  Service: Cardiovascular;  Laterality: N/A;   RIGHT HEART CATH N/A 09/09/2022   Procedure: RIGHT HEART CATH;  Surgeon: Early Osmond, MD;  Location: [place] [facility];  Service: Cardiovascular;  Laterality: N/A;    reports that he quit smoking about 30 years ago. His smoking use included cigarettes. He has a 10.00 pack-year smoking history. He has quit using smokeless tobacco.  His smokeless tobacco use included chew. He reports that he does not drink alcohol and does not use drugs. family history includes Arthritis in his paternal grandmother; Asthma in his sister; Cancer in his father; Diabetes in his mother; Hypertension in his brother, daughter, father, mother, sister, and son; Lung cancer in his father. No Known Allergies Current Outpatient Medications on File Prior to Visit  Medication Sig Dispense Refill   acetaminophen (TYLENOL) 650 MG CR tablet Take 1,300 mg by mouth in the morning and at bedtime.     amLODipine (NORVASC) 5 MG tablet Take 1 tablet (5 mg total) by mouth daily. Resume home dose from 10/06/22     aspirin EC 81 MG tablet Take 1 tablet (81 mg total) by mouth daily. Swallow whole. 30 tablet 0   atorvastatin (LIPITOR) 80 MG tablet TAKE 1 TABLET BY  MOUTH EVERY DAY (Patient taking differently: Take 80 mg by mouth daily.) 90 tablet 3   Budeson-Glycopyrrol-Formoterol (BREZTRI AEROSPHERE) 160-9-4.8 MCG/ACT AERO 1 puffs twice per day 10.7 g 11   Cholecalciferol (VITAMIN D3) 50 MCG (2000 UT) TABS Take 2,000 Units by mouth daily.     dorzolamide-timolol (COSOPT) 22.3-6.8 MG/ML ophthalmic solution Place 1 drop into both eyes in the morning and at bedtime.     ezetimibe (ZETIA) 10 MG tablet Take 1 tablet (10 mg total) by mouth daily. 90 tablet 3   fexofenadine (ALLEGRA) 180 MG tablet TAKE 1 TABLET (180 MG TOTAL) BY MOUTH DAILY. (Patient taking differently: Take 180 mg by mouth daily.) 90 tablet 1   flecainide (TAMBOCOR) 100 MG tablet TAKE 1 TABLET BY MOUTH TWICE A DAY (Patient taking differently: Take 100 mg by mouth 2 (two) times daily.) 180 tablet 3   latanoprost (XALATAN) 0.005 % ophthalmic solution Place 1 drop into both eyes at bedtime.      meclizine (ANTIVERT) 12.5 MG tablet Take  1 tablet (12.5 mg total) by mouth 3 (three) times daily as needed for dizziness.     omeprazole (PRILOSEC) 40 MG capsule TAKE 1 CAPSULE BY MOUTH EVERY DAY AS NEEDED (Patient taking differently: Take 40 mg by mouth daily. TAKE 1 CAPSULE BY MOUTH EVERY DAY AS NEEDED) 90 capsule 2   psyllium (METAMUCIL) 0.52 g capsule Take 2 capsules by mouth daily.     rivaroxaban (XARELTO) 20 MG TABS tablet TAKE 1 TABLET BY MOUTH DAILY WITH SUPPER. (Patient taking differently: Take 20 mg by mouth daily with supper.) 90 tablet 3   tamsulosin (FLOMAX) 0.4 MG CAPS capsule TAKE 1 CAPSULE BY MOUTH TWICE A DAY (Patient taking differently: Take 0.4 mg by mouth 2 (two) times daily.) 180 capsule 3   telmisartan (MICARDIS) 20 MG tablet Take 1 tablet (20 mg total) by mouth at bedtime. Resume from 10/06/22     traMADol (ULTRAM) 50 MG tablet TAKE 1 TABLET BY MOUTH EVERY 6 HOURS AS NEEDED. FOR PAIN (Patient taking differently: Take 50 mg by mouth every 6 (six) hours as needed for moderate pain.) 120  tablet 2   No current facility-administered medications on file prior to visit.        ROS:  All others reviewed and negative.  Objective        PE:  BP 128/76 (BP Location: Right Arm, Patient Position: Sitting, Cuff Size: Large)   Pulse 61   Temp 98 F (36.7 C) (Oral)   Ht '5\' 9"'$  (1.753 m)   Wt 202 lb (91.6 kg)   SpO2 93%   BMI 29.83 kg/m                 Constitutional: Pt appears in NAD               HENT: Head: NCAT.                Right Ear: External ear normal.                 Left Ear: External ear normal.                Eyes: . Pupils are equal, round, and reactive to light. Conjunctivae and EOM are normal               Nose: without d/c or deformity               Neck: Neck supple. Gross normal ROM               Cardiovascular: Normal rate and regular rhythm.                 Pulmonary/Chest: Effort normal and breath sounds without rales or wheezing.                Abd:  Soft, NT, ND, + BS, no organomegaly               Neurological: Pt is alert. At baseline orientation,CN 2-12 intact               Right neck with typical post op swelling only, no pain or redness               Skin: Skin is warm. No rashes, no other new lesions, LE edema - none               Psychiatric: Pt behavior is normal without agitation   Micro: none  Cardiac tracings I have  personally interpreted today:  none  Pertinent Radiological findings (summarize): none   Lab Results  Component Value Date   WBC 13.2 (H) 10/02/2022   HGB 13.0 10/02/2022   HCT 39.8 10/02/2022   PLT 129 (L) 10/02/2022   GLUCOSE 136 (H) 10/02/2022   CHOL 116 10/02/2022   TRIG 45 10/02/2022   HDL 52 10/02/2022   LDLDIRECT 183.4 12/26/2013   LDLCALC 55 10/02/2022   ALT 26 09/26/2022   AST 20 09/26/2022   NA 141 10/02/2022   K 4.0 10/02/2022   CL 107 10/02/2022   CREATININE 1.13 10/02/2022   BUN 14 10/02/2022   CO2 21 (L) 10/02/2022   TSH 1.29 06/01/2022   PSA 2.01 11/24/2021   INR 2.8 (H) 09/26/2022   HGBA1C  6.1 (H) 09/28/2022   Assessment/Plan:  [name] is a 81 y.o. Black or African American [2] male with  has a past medical history of Abdominal pain, epigastric, Allergic rhinitis, cause unspecified, Arthritis, BPH (benign prostatic hyperplasia), CHF (congestive heart failure) ([place]), Chronic anticoagulation, Cognitive communication deficit (09/29/2022), Coronary artery calcification seen on CAT scan (12/26/2012), Depressive disorder, not elsewhere classified, Diabetes mellitus treated with oral medication ([place]) (09/28/2022), Diverticulosis of colon (without mention of hemorrhage), Esophageal reflux, High risk medication use, Hyperlipidemia, Hyperplastic rectal polyp, Hypertension, Lumbago, Pacemaker, Paralyzed hemidiaphragm, Persistent atrial fibrillation ([place]), 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  History of stroke Stable, pt to continue statin and zetia, f/u neurology as planned  Diabetes mellitus treated with oral medication ([place]) Ok to try change jardiance to farxiga, but may need further change depending on cost, pt has access to let us know on mychart  Followup: Return in about 3 months (around 01/06/2023).  Cathlean Cower, MD 10/06/2022 2:34 PM [facility]

## 2022-10-06 NOTE — Patient Instructions (Addendum)
Ok to change the jardiance 10 mg to the [place] 5 mg, which is about the same effectiveness  Please continue all other medications as before, and refills have been done if requested.  Please have the pharmacy call with any other refills you may need.  Please continue your efforts at being more active, low cholesterol diet, and weight control.  You are otherwise up to date with prevention measures today.  Please keep your appointments with your specialists as you may have planned - Neurology and Vascular surgury as you have planned  We can hold on further lab tests for now  Please make an Appointment to return in 3 months, or sooner if needed, also with Lab Appointment for testing done 3-5 days before at the [facility] (so this is for TWO appointments - please see the scheduling desk as you leave)

## 2022-10-06 NOTE — Assessment & Plan Note (Addendum)
Stable, pt to continue statin and zetia, f/u neurology as planned

## 2022-10-06 NOTE — Progress Notes (Unsigned)
NEUROLOGY FOLLOW UP OFFICE NOTE  [name] [id number]  Subjective:  [name] is a 81 y.o. 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  To briefly review: Patient was seen initially on 07/15/22 for dizziness.  Patient describes dizziness that feels like things are spinning around him. It has been present for 2-3 years (~2020). It occurs when he turns his head quickly or when he is in bed when he turns over. The sensation passes if he is still. Patient started on meclizine for dizziness, which has improved things. He last had an episode of dizziness a few months ago. He previously tried to wean his meclizine from 2 times daily to once daily, but the sensations came back.   He also mentions that when he first stands up, he is off balance. If he stands up too fast, he feels like he is going to "fall out." If he gives himself a minute, he feels better. He had a near fall in the last month, but denies syncope.   Patient denies impaired sweating, excessive mucosal dryness, early satiety, constipation (on metamucil for years). He endorses some postprandial abdominal bloating and urinary frequency due to water pills.   There are no complaints relating to other symptoms of small fiber modalities including paresthesia/pain.   The patient has not noticed any recent skin rashes nor does he report any constitutional symptoms like fever, night sweats, anorexia or unintentional weight loss.   EtOH use: None  Restrictive diet? No Family history of neuropathy/myopathy/NM disease? no  07/15/22: Assessment: In terms of his lightheadedness upon standing, his orthostatic vitals showed a > 10 mmHg drop in diastolic pressures going from sitting to standing which is consistent  with the diagnosis of orthostatic hypotension. There was no increased heart rate. The mostly likely cause of his orthostatic hypotension and lack of HR response are his BP meds and beta blocker.    His vertigo on turning in bed or quickly turning his head is likely secondary to benign paroxysmal positional vertigo (BPPV). We discussed BPPV and the Epley maneuver for treatment, which I also demonstrated. He has less episodes currently, but if this worsens, vestibular rehab can be considered.   Available diagnostic data is significant for recent HbA1c of 6.6. This could contribute to neuropathy, which I discussed with the patient today.  PLAN: -Blood work: IFE, SPEP -Epley discussed and demonstrated for BPPV. Will consider vestibular therapy if not improving or worsening -Okay to continue Meclizine, but can taper as able -Discussed orthostatic hypotension. Recommended hydration, compression stockings, and slow transitions. He should also discuss medications with cardiology and PCP, though he may need medications for his heart, which we discussed. -Will monitor proximal muscle weakness of upper extremities. This was not the purpose of today's visit, but if it does not improve or worsens, he may benefit from further investigation.  Since their last visit: Labs sent after last clinic visit were normal. Patient was doing well until 09/26/22. He woke up that morning with left arm weakness and numbness. Patient was laying on the left arm when he woke up and felt like it was asleep. Later on that day, his arm with feeling better. There were no symptoms in the left leg. He denies face droop, difficulty speaking, or voice changes. He presented  to the ED that day. CT head on 09/26/22 showed no acute process. MRI brain was recommended but patient's PM was incompatible. Repeat CTH on 09/27/22 showed loss of gray/white differentiation in right posterior frontal lobe suspicious for infarct. CTA of head and neck showed  stenosis of RICA origin in the neck without large vessel occlusion. Patient underwent successful R CEA on 10/01/22 due to concerns of symptomatic carotid.  Other stroke work up, normal EF on echo, LDL 55, HbA1c 6.1.  Patient was discharged on 10/02/22 on Xarelto 20 mg, ASA 81 mg, atorva 40 mg, and zetia 10 mg daily.  Currently, patient has low back pain. He does not feel numb in the left arm, but does have some difficulty picking things up. He has difficulty getting his jacket.   Patient denies missing any doses of Xarelto prior to his symptoms on 09/26/22.   Regarding his dizziness, patient has vertigo particularly when tilting his head backward or getting up really quickly.  Patient was to go to PT after discharge, but has not heard about scheduling.   MEDICATIONS:  Outpatient Encounter Medications as of 10/14/2022  Medication Sig   acetaminophen (TYLENOL) 650 MG CR tablet Take 1,300 mg by mouth in the morning and at bedtime.   amLODipine (NORVASC) 5 MG tablet Take 1 tablet (5 mg total) by mouth daily. Resume home dose from 10/06/22   aspirin EC 81 MG tablet Take 1 tablet (81 mg total) by mouth daily. Swallow whole.   atorvastatin (LIPITOR) 80 MG tablet TAKE 1 TABLET BY MOUTH EVERY DAY (Patient taking differently: Take 80 mg by mouth daily.)   Budeson-Glycopyrrol-Formoterol (BREZTRI AEROSPHERE) 160-9-4.8 MCG/ACT AERO 1 puffs twice per day   Cholecalciferol (VITAMIN D3) 50 MCG (2000 UT) TABS Take 2,000 Units by mouth daily.   dorzolamide-timolol (COSOPT) 22.3-6.8 MG/ML ophthalmic solution Place 1 drop into both eyes in the morning and at bedtime.   ezetimibe (ZETIA) 10 MG tablet Take 1 tablet (10 mg total) by mouth daily.   fexofenadine (ALLEGRA) 180 MG tablet TAKE 1 TABLET (180 MG TOTAL) BY MOUTH DAILY. (Patient taking differently: Take 180 mg by mouth daily.)   flecainide (TAMBOCOR) 100 MG tablet TAKE 1 TABLET BY MOUTH TWICE A DAY (Patient taking differently: Take 100 mg by mouth 2 (two)  times daily.)   glipiZIDE (GLUCOTROL XL) 2.5 MG 24 hr tablet Take 1 tablet (2.5 mg total) by mouth daily with breakfast.   latanoprost (XALATAN) 0.005 % ophthalmic solution Place 1 drop into both eyes at bedtime.    meclizine (ANTIVERT) 12.5 MG tablet Take 1 tablet (12.5 mg total) by mouth 3 (three) times daily as needed for dizziness.   omeprazole (PRILOSEC) 40 MG capsule TAKE 1 CAPSULE BY MOUTH EVERY DAY AS NEEDED (Patient taking differently: Take 40 mg by mouth daily. TAKE 1 CAPSULE BY MOUTH EVERY DAY AS NEEDED)   psyllium (METAMUCIL) 0.52 g capsule Take 2 capsules by mouth daily.   rivaroxaban (XARELTO) 20 MG TABS tablet TAKE 1 TABLET BY MOUTH DAILY WITH SUPPER. (Patient taking differently: Take 20 mg by mouth daily with supper.)   tamsulosin (FLOMAX) 0.4 MG CAPS capsule TAKE 1 CAPSULE BY MOUTH TWICE A DAY (Patient taking differently: Take 0.4 mg by mouth 2 (two) times daily.)   telmisartan (MICARDIS) 20 MG tablet Take 1 tablet (20 mg total) by mouth at bedtime. Resume from 10/06/22   traMADol (ULTRAM) 50 MG tablet TAKE 1 TABLET BY MOUTH EVERY 6 HOURS AS NEEDED. FOR PAIN (Patient taking differently:  Take 50 mg by mouth every 6 (six) hours as needed for moderate pain.)   [DISCONTINUED] dapagliflozin propanediol (FARXIGA) 5 MG TABS tablet Take 1 tablet (5 mg total) by mouth daily before breakfast.   No facility-administered encounter medications on file as of 10/14/2022.    PAST MEDICAL HISTORY: Past Medical History:  Diagnosis Date   Abdominal pain, epigastric    Allergic rhinitis, cause unspecified    Arthritis    BPH (benign prostatic hyperplasia)    CHF (congestive heart failure) (HCC)    Chronic anticoagulation    xarelto   Cognitive communication deficit 09/29/2022   Coronary artery calcification seen on CAT scan 12/26/2012   Depressive disorder, not elsewhere classified    Diabetes mellitus treated with oral medication ([place]) 09/28/2022   Diverticulosis of colon (without mention  of hemorrhage)    Esophageal reflux    High risk medication use    Hyperlipidemia    Hyperplastic rectal polyp    Hypertension    Lumbago    Pacemaker    Paralyzed hemidiaphragm    LEFT   Persistent atrial fibrillation ([place])    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 ([place]) 09/29/2022   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 especially given the patient's history of COPD and 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   Sick sinus syndrome ([place]) 05/16/2009   s/p PPM ([employer]) by [name]   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 ([place]) 09/28/2022    PAST SURGICAL HISTORY: Past Surgical History:  Procedure Laterality Date   CYSTOSCOPY WITH RETROGRADE PYELOGRAM, URETEROSCOPY AND STENT PLACEMENT Left 12/30/2021   Procedure: CYSTOSCOPY WITH RETROGRADE PYELOGRAM, URETEROSCOPY;  Surgeon: Remi Haggard, MD;  Location: [facility];  Service: Urology;  Laterality: Left;   ENDARTERECTOMY Right 10/01/2022   Procedure: ENDARTERECTOMY CAROTID;  Surgeon: Cherre Robins, MD;  Location: [facility] OR;  Service: Vascular;  Laterality: Right;   ESOPHAGOGASTRODUODENOSCOPY N/A 12/05/2013   Procedure: ESOPHAGOGASTRODUODENOSCOPY (EGD);  Surgeon: [name], MD;  Location: [facility];  Service: Endoscopy;  Laterality: N/A;   ESOPHAGOGASTRODUODENOSCOPY N/A 07/04/2015   Procedure: ESOPHAGOGASTRODUODENOSCOPY (EGD);  Surgeon: [name], MD;  Location: [place];  Service: Endoscopy;  Laterality: N/A;   EXTRACORPOREAL SHOCK WAVE LITHOTRIPSY Left 10/13/2021   Procedure: EXTRACORPOREAL SHOCK WAVE LITHOTRIPSY (ESWL);  Surgeon: Lucas Mallow, MD;  Location: [facility];  Service: Urology;  Laterality: Left;  90 MINS   INSERT / REPLACE / REMOVE PACEMAKER  05/2009    [employer] [id number]  (serial number [id number]) dual-chamber pacemaker   LEAD REVISION/REPAIR N/A 10/15/2020   Procedure: LEAD REVISION/REPAIR;  Surgeon:  Thompson Grayer, MD;  Location: [place] [facility];  Service: Cardiovascular;  Laterality: N/A;   PACEMAKER PLACEMENT Bilateral    '05   PATCH ANGIOPLASTY Right 10/01/2022   Procedure: RIGHT CAROTID ARTERY PATCH ANGIOPLASTY WITH 1CM x 6CM XENOSURE BIOLOGIC PATCH;  Surgeon: Cherre Robins, MD;  Location: [place];  Service: Vascular;  Laterality: Right;   PPM GENERATOR CHANGEOUT N/A 10/15/2020   Procedure: PPM GENERATOR CHANGEOUT;  Surgeon: Thompson Grayer, MD;  Location: [place] [facility];  Service: Cardiovascular;  Laterality: N/A;   RIGHT HEART CATH N/A 09/09/2022   Procedure: RIGHT HEART CATH;  Surgeon: Early Osmond, MD;  Location: [place] [facility];  Service: Cardiovascular;  Laterality: N/A;    ALLERGIES: No Known Allergies  FAMILY HISTORY: Family History  Problem Relation Age of Onset  Diabetes Mother    Hypertension Mother    Hypertension Father    Lung cancer Father    Cancer Father    Asthma Sister    Hypertension Sister    Arthritis Paternal Grandmother    Hypertension Brother    Hypertension Daughter    Hypertension Son     SOCIAL HISTORY: Social History   Tobacco Use   Smoking status: Former    Packs/day: 0.50    Years: 20.00    Total pack years: 10.00    Types: Cigarettes    Quit date: 11/17/1991    Years since quitting: 30.9   Smokeless tobacco: Former    Types: Nurse, children's Use: Never used  Substance Use Topics   Alcohol use: No   Drug use: No   Social History   Social History Narrative   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    Caffeine 1-2 day cups   Right handed   Tri-level home      Do you live at home alone?with wife                 Objective:  Vital Signs:  BP 122/77   Pulse 66   Ht _0  (1.753 m)   Wt 200 lb (90.7 kg)   SpO2 97%   BMI 29.53 kg/m   General: General appearance: Awake and alert. No distress. Cooperative with exam.  Skin: No obvious rash or jaundice. HEENT: Atraumatic. Anicteric. Lungs: Non-labored  breathing on room air   Neurological: Mental Status: Alert. Speech fluent. No pseudobulbar affect Cranial Nerves: CNII: No RAPD. Visual fields intact. CNIII, IV, VI: PERRL. No nystagmus. EOMI. CN V: Facial sensation intact bilaterally to fine touch. [place] clench strong. CN VII: Facial muscles symmetric and strong. No ptosis at rest. CN VIII: Hears finger rub well bilaterally. CN IX: No hypophonia. CN X: Palate elevates symmetrically. CN XI: Full strength shoulder shrug bilaterally. CN XII: Tongue protrusion full and midline. No atrophy or fasciculations. No significant dysarthria Motor: Tone is normal.  Individual muscle group testing (MRC grade out of 5):  Movement     Neck flexion 5    Neck extension 5     Right Left   Shoulder abduction 5 4   Shoulder adduction 5 5   Shoulder ext rotation 5 4   Shoulder int rotation 5 5   Elbow flexion 5 5-   Elbow extension 5 5   Wrist extension 5 5   Wrist flexion 5 5   Finger abduction - FDI 5 5   Finger abduction - ADM 5 5   Finger extension 5 5   Finger distal flexion - 2/_1 Finger distal flexion - 4/_2 Thumb flexion - FPL 5 5   Thumb abduction - APB 5 5    Hip flexion 5 5   Hip extension 5 5   Hip adduction 5 5   Hip abduction 5 5   Knee extension 5 5   Knee flexion 5 5   Dorsiflexion 5 5   Plantarflexion 5 5     Reflexes:  Right Left   Bicep Trace Trace   Tricep Trace Trace   BrRad Trace Trace   Knee 2+ 2+   Ankle 0 0    Sensation: Pinprick intact in all extremities including LUE Coordination: Intact finger-to- nose-finger bilaterally. Romberg negative. Gait: Able to rise from chair with arms crossed unassisted. Normal, narrow-based gait.  Labs and Imaging review: New results: IFE, SPEP (07/15/22): no M protein HbA1c (09/28/22): 6.1  CT head wo contrast (09/26/22): FINDINGS: Brain: No evidence of acute infarction, hemorrhage, hydrocephalus, extra-axial collection or mass lesion/mass effect.  Periventricular and deep white matter hypodensity.   Vascular: No hyperdense vessel or unexpected calcification.   Skull: Normal. Negative for fracture or focal lesion.   Sinuses/Orbits: No acute finding. Unchanged chronic fracture deformity of the medial inferior floor of the left orbit (series 4, image 56).   Other: Unchanged, very central appearing position of the dens with respect to the foramen magnum, which appears to deflect the lower brainstem and upper cervical spinal cord (series 5, image 31).   IMPRESSION: 1. No acute intracranial pathology. Small-vessel white matter disease. Consider MRI to more sensitively evaluate for acute diffusion restricting infarction if suspected. 2. Unchanged, very central appearing position of the dens with respect to the foramen magnum, which appears to deflect the lower brainstem and upper cervical spinal cord. This appearance is as on dedicated examination of the cervical spine dated 09/06/2019.  CT head, CTA head and neck (09/27/22): FINDINGS: CT HEAD FINDINGS   Brain: Hypoattenuation and loss of gray differentiation in the high right posterior frontal lobe, suspicious for acute infarct. No acute hemorrhage. Patchy white matter hypodensities, nonspecific but compatible with chronic microvascular ischemic disease. No evidence of mass lesion, midline shift, or hydrocephalus.   Vascular: Calcific atherosclerosis.   Skull: No acute fracture.   Sinuses/Orbits: No acute fracture.   Other: No mastoid effusions.   Review of the MIP images confirms the above findings   CTA NECK FINDINGS   Aortic arch: Great vessel origins are patent. Without significant stenosis.   Right carotid system: Atherosclerosis at the carotid bifurcation with proximally 50% stenosis. Retropharyngeal course of the ICA.   Left carotid system: Atherosclerosis at the carotid bifurcation without greater than 50% stenosis. Retropharyngeal course of the ICA.    Vertebral arteries: Right dominant. The non dominant left vertebral artery is small throughout its course. Both vertebral arteries are patent without significant stenosis.   Skeleton: Chronic widening of the atlantodental interval (up to 6 mm), suggestive of instability/laxity. Severe multilevel degenerative change.   Other neck: No acute findings.   Upper chest: No acute findings.   Review of the MIP images confirms the above findings   CTA HEAD FINDINGS   Anterior circulation: Bilateral intracranial ICAs, MCAs, and ACAs are patent without proximal hemodynamically significant stenosis. The low   Posterior circulation: Small/non dominant left vertebral artery makes minimal contribution to the basilar artery, anatomic variant. The dominant right vertebral artery, basilar artery and bilateral posterior cerebral arteries are patent without proximal hemodynamically significant stenosis.   Venous sinuses: As permitted by contrast timing, patent.   Anatomic variants: Detailed above.   Review of the MIP images confirms the above findings   IMPRESSION: CT head:   1. Hypoattenuation and loss of gray differentiation in the high right posterior frontal lobe, suspicious for acute infarct. Recommend MRI to further assess if the patient is able. 2. No acute hemorrhage.   CTA:   1. No emergent large vessel occlusion. 2. 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 3. Chronic widening of the atlantodental interval (up to 6 mm), suggestive of instability/laxity.  Echo (08/12/22): IMPRESSIONS   1. Left ventricular ejection fraction, by estimation, is 55%. The left  ventricle has normal function. The left ventricle has no regional wall  motion abnormalities. Left ventricular diastolic parameters are  consistent  with Grade I diastolic dysfunction  (impaired relaxation).   2. Right ventricular systolic function is mildly reduced. The right  ventricular size is mildly  enlarged. There is mildly elevated pulmonary  artery systolic pressure. The estimated right ventricular systolic  pressure is 28.7 mmHg.   3. Right atrial size was mildly dilated.   4. The mitral valve is grossly normal. Trivial mitral valve  regurgitation. No evidence of mitral stenosis.   5. The aortic valve is grossly normal. Aortic valve regurgitation is  trivial. No aortic stenosis is present.   6. The inferior vena cava is normal in size with greater than 50%  respiratory variability, suggesting right atrial pressure of 3 mmHg.    Previously reviewed labs: HbA1c (06/01/22): 6.6 Vit B12 (11/24/21): 389 TSH (06/01/22): 1.29 ESR (11/04/21): 10  CT head (11/04/21): FINDINGS: Brain:   Mild generalized cerebral atrophy.   Mild patchy and ill-defined hypoattenuation within the cerebral white matter, nonspecific but compatible with chronic small vessel ischemic disease.   Unchanged dural calcifications along the falx.   There is no acute intracranial hemorrhage.   No demarcated cortical infarct.   No extra-axial fluid collection.   No evidence of an intracranial mass.   No midline shift.   Vascular: No hyperdense vessel. Atherosclerotic calcifications.   Skull: Normal. Negative for fracture or focal lesion.   Sinuses/Orbits: Visualized orbits show no acute finding. Redemonstrated chronic fracture deformity of the left orbital floor. No significant paranasal sinus disease at the imaged levels.   Other: Trace fluid within the right mastoid air cells.   IMPRESSION: No evidence of acute intracranial abnormality.   Mild chronic small-vessel ischemic changes within the cerebral white matter.   Trace fluid within the right mastoid air cells.   CT cervical spine (09/06/19): CT CERVICAL SPINE FINDINGS   Alignment: Straightening of the cervical spine. No subluxation. Anterior offset of the spinal laminar line. Widening of the predental space up to 6 mm.   Skull base  and vertebrae: Craniovertebral junction is intact. No fracture is seen.   Soft tissues and spinal canal: No prevertebral fluid collections. Narrowing of the canal at the C1-C2 articulation with mass effect on the anterior thecal sac.   Disc levels: Moderate-to-marked diffuse degenerative change C3 through C7 with disc space narrowing, vacuum disc and osteophytes. Multiple level facet degenerative change with resultant foraminal stenosis at multiple levels.   Upper chest: Lung apices are clear.  Carotid vascular calcification   Other: None   IMPRESSION: Straightening of the cervical spine with advanced degenerative change. No definitive fracture seen, however there is abnormal widening of the atlantodental interval, raising concern for ligamentous injury at this level given history of trauma.  Assessment/Plan:  This is [name], a 81 y.o. male with: Acute onset left sided weakness and numbness - CTH suspicious for infarct in right frontal lobe. CTA with stenosis of right ICA, s/p right CEA on 10/01/22. Patient's numbness has resolved. He still has weakness, which per my exam is similar to prior, but patient does feel he is weaker today than last visit. Vertigo - likely BPPV given that it occurs with head position changes Orthostatic hypotension - lightheaded on standing quickly   Plan: --Follow up with vascular surgery as planned on 10/27/22 (Dr. Stanford Breed) -Continue medications for now. -Continue Xarelto 20 mg daily. No clear indication of Xarelto failure to suggest that he should switch to eliquis at this time. -Continue ASA - will message Dr. Stanford Breed if this needs to  be continued from carotid perspective -Physical Therapy for LUE weakness -Continue meclizine as needed -Continue conservative management as previously discussed for vertigo (Epley) -Continue conservative management for orthostatic hypotension as discussed at last visit  Return to clinic as planned previously on  01/14/23 at 2pm.  Total time spent reviewing records, interview, history/exam, documentation, and coordination of care on day of encounter:  90 min  Kai Levins, MD  CC:  Biagio Borg, MD [place] [address]

## 2022-10-12 ENCOUNTER — Ambulatory Visit: Payer: Medicare Other

## 2022-10-13 MED ORDER — GLIPIZIDE ER 2.5 MG PO TB24: 2.5000 mg | ORAL_TABLET | Freq: Every day | ORAL | 3 refills | Status: DC

## 2022-10-13 NOTE — Telephone Encounter (Signed)
Per patient request, is there an alternative to [place]?

## 2022-10-14 ENCOUNTER — Ambulatory Visit (INDEPENDENT_AMBULATORY_CARE_PROVIDER_SITE_OTHER): Payer: Medicare Other | Admitting: Neurology

## 2022-10-14 ENCOUNTER — Encounter: Payer: Self-pay | Admitting: Neurology

## 2022-10-14 VITALS — BP 122/77 | HR 66 | Ht 69.0 in | Wt 200.0 lb

## 2022-10-14 DIAGNOSIS — R29898 Other symptoms and signs involving the musculoskeletal system: Secondary | ICD-10-CM | POA: Diagnosis not present

## 2022-10-14 DIAGNOSIS — I951 Orthostatic hypotension: Secondary | ICD-10-CM | POA: Diagnosis not present

## 2022-10-14 DIAGNOSIS — H8110 Benign paroxysmal vertigo, unspecified ear: Secondary | ICD-10-CM

## 2022-10-14 DIAGNOSIS — R42 Dizziness and giddiness: Secondary | ICD-10-CM

## 2022-10-14 DIAGNOSIS — R7303 Prediabetes: Secondary | ICD-10-CM | POA: Diagnosis not present

## 2022-10-14 DIAGNOSIS — I639 Cerebral infarction, unspecified: Secondary | ICD-10-CM

## 2022-10-14 DIAGNOSIS — R200 Anesthesia of skin: Secondary | ICD-10-CM

## 2022-10-14 DIAGNOSIS — R209 Unspecified disturbances of skin sensation: Secondary | ICD-10-CM | POA: Diagnosis not present

## 2022-10-14 NOTE — Patient Instructions (Addendum)
I saw you today for follow up of stroke on 09/26/22.  Continue your current medications. We are not making changes today.  I am reordering the physical therapy for your left arm weakness/numbness.  Follow up with Dr Stanford Breed in vascular surgery as planned on 10/27/22.  You will follow up with me as previously planned on 01/14/23 at 2 pm.  Please let me know if you have any questions or concerns in the meantime.  The physicians and staff at [facility] are committed to providing excellent care. You may receive a survey requesting feedback about your experience at our office. We strive to receive "very good" responses to the survey questions. If you feel that your experience would prevent you from giving the office a "very good " response, please contact our office to try to remedy the situation. We may be reached at [phone]. Thank you for taking the time out of your busy day to complete the survey.  Kai Levins, MD [facility]  Preventing Falls at [facility] are common, often dreaded events in the lives of older people. Aside from the obvious injuries and even death that may result, fall can cause wide-ranging consequences including loss of independence, mental decline, decreased activity and mobility. Younger people are also at risk of falling, especially those with chronic illnesses and fatigue.  Ways to reduce risk for falling Examine diet and medications. Warm foods and alcohol dilate blood vessels, which can lead to dizziness when standing. Sleep aids, antidepressants and pain medications can also increase the likelihood of a fall.  Get a vision exam. Poor vision, cataracts and glaucoma increase the chances of falling.  Check foot gear. Shoes should fit snugly and have a sturdy, nonskid sole and a broad, low heel  Participate in a physician-approved exercise program to build and maintain muscle strength and improve balance and coordination. Programs that use ankle  weights or stretch bands are excellent for muscle-strengthening. Water aerobics programs and low-impact Tai Chi programs have also been shown to improve balance and coordination.  Increase vitamin D intake. Vitamin D improves muscle strength and increases the amount of calcium the body is able to absorb and deposit in bones.  How to prevent falls from common hazards Floors - Remove all loose wires, cords, and throw rugs. Minimize clutter. Make sure rugs are anchored and smooth. Keep furniture in its usual place.  Chairs -- Use chairs with straight backs, armrests and firm seats. Add firm cushions to existing pieces to add height.  Bathroom - Install grab bars and non-skid tape in the tub or shower. Use a bathtub transfer bench or a shower chair with a back support Use an elevated toilet seat and/or safety rails to assist standing from a low surface. Do not use towel racks or bathroom tissue holders to help you stand.  Lighting - Make sure halls, stairways, and entrances are well-lit. Install a night light in your bathroom or hallway. Make sure there is a light switch at the top and bottom of the staircase. Turn lights on if you get up in the middle of the night. Make sure lamps or light switches are within reach of the bed if you have to get up during the night.  Kitchen - Install non-skid rubber mats near the sink and stove. Clean spills immediately. Store frequently used utensils, pots, pans between waist and eye level. This helps prevent reaching and bending. Sit when getting things out of lower cupboards.  Living room/ Bedrooms - Place  furniture with wide spaces in between, giving enough room to move around. Establish a route through the living room that gives you something to hold onto as you walk.  Stairs - Make sure treads, rails, and rugs are secure. Install a rail on both sides of the stairs. If stairs are a threat, it might be helpful to arrange most of your activities on the lower level  to reduce the number of times you must climb the stairs.  Entrances and doorways - Install metal handles on the walls adjacent to the doorknobs of all doors to make it more secure as you travel through the doorway.  Tips for maintaining balance Keep at least one hand free at all times. Try using a backpack or fanny pack to hold things rather than carrying them in your hands. Never carry objects in both hands when walking as this interferes with keeping your balance.  Attempt to swing both arms from front to back while walking. This might require a conscious effort if Parkinson's disease has diminished your movement. It will, however, help you to maintain balance and posture, and reduce fatigue.  Consciously lift your feet off of the ground when walking. Shuffling and dragging of the feet is a common culprit in losing your balance.  When trying to navigate turns, use a "U" technique of facing forward and making a wide turn, rather than pivoting sharply.  Try to stand with your feet shoulder-length apart. When your feet are close together for any length of time, you increase your risk of losing your balance and falling.  Do one thing at a time. Don't try to walk and accomplish another task, such as reading or looking around. The decrease in your automatic reflexes complicates motor function, so the less distraction, the better.  Do not wear rubber or gripping soled shoes, they might "catch" on the floor and cause tripping.  Move slowly when changing positions. Use deliberate, concentrated movements and, if needed, use a grab bar or walking aid. Count 15 seconds between each movement. For example, when rising from a seated position, wait 15 seconds after standing to begin walking.  If balance is a continuous problem, you might want to consider a walking aid such as a cane, walking stick, or walker. Once you've mastered walking with help, you might be ready to try it on your own again.

## 2022-10-15 ENCOUNTER — Encounter: Payer: Self-pay | Admitting: Neurology

## 2022-10-15 ENCOUNTER — Telehealth: Payer: Self-pay | Admitting: Neurology

## 2022-10-15 NOTE — Telephone Encounter (Signed)
I spoke with Dr. Stanford Breed from vascular surgery who did patient's CEA regarding Xarelto and aspirin therapy. We are in agreement that patient does not need to continue aspirin.  I called patient and shared this information with him. He will stop asa 81 mg daily and continue his other medications, including Xarelto, as prescribed.  All questions were answered.  Kai Levins, MD [facility]

## 2022-10-20 ENCOUNTER — Ambulatory Visit (INDEPENDENT_AMBULATORY_CARE_PROVIDER_SITE_OTHER): Payer: Medicare Other

## 2022-10-20 ENCOUNTER — Other Ambulatory Visit: Payer: Self-pay | Admitting: *Deleted

## 2022-10-20 DIAGNOSIS — I495 Sick sinus syndrome: Secondary | ICD-10-CM

## 2022-10-20 DIAGNOSIS — I6529 Occlusion and stenosis of unspecified carotid artery: Secondary | ICD-10-CM

## 2022-10-20 LAB — CUP PACEART REMOTE DEVICE CHECK
Battery Remaining Longevity: 156 mo
Battery Remaining Percentage: 100 %
Brady Statistic RA Percent Paced: 97 %
Brady Statistic RV Percent Paced: 0 %
Date Time Interrogation Session: 20231205025200
Implantable Lead Connection Status: 753985
Implantable Lead Connection Status: 753985
Implantable Lead Implant Date: 20100719
Implantable Lead Implant Date: 20211130
Implantable Lead Location: 753859
Implantable Lead Location: 753860
Implantable Lead Model: 4136
Implantable Lead Model: 7842
Implantable Lead Serial Number: 1055910
Implantable Lead Serial Number: 28594836
Implantable Pulse Generator Implant Date: 20211130
Lead Channel Impedance Value: 598 Ohm
Lead Channel Impedance Value: 697 Ohm
Lead Channel Setting Pacing Amplitude: 2 V
Lead Channel Setting Pacing Amplitude: 2.5 V
Lead Channel Setting Pacing Pulse Width: 1 ms
Lead Channel Setting Sensing Sensitivity: 2.5 mV
Pulse Gen Serial Number: 951809
Zone Setting Status: 755011

## 2022-10-22 ENCOUNTER — Other Ambulatory Visit: Payer: Self-pay | Admitting: Internal Medicine

## 2022-10-23 DIAGNOSIS — R42 Dizziness and giddiness: Secondary | ICD-10-CM | POA: Diagnosis not present

## 2022-10-23 DIAGNOSIS — R200 Anesthesia of skin: Secondary | ICD-10-CM | POA: Diagnosis not present

## 2022-10-23 DIAGNOSIS — R29898 Other symptoms and signs involving the musculoskeletal system: Secondary | ICD-10-CM | POA: Diagnosis not present

## 2022-10-23 DIAGNOSIS — M6281 Muscle weakness (generalized): Secondary | ICD-10-CM | POA: Diagnosis not present

## 2022-10-23 DIAGNOSIS — I639 Cerebral infarction, unspecified: Secondary | ICD-10-CM | POA: Diagnosis not present

## 2022-10-26 NOTE — Progress Notes (Unsigned)
[facility] [place]  ASSESSMENT / PLAN: 81 y.o. male status post right carotid endarterectomy 10/01/22. Duplex today shows good technical result.   Recommend: Complete cessation from all tobacco products. Blood glucose control with goal A1c < 7%. Blood pressure control with goal blood pressure < 140/90 mmHg. Lipid reduction therapy with goal LDL-C <100 mg/dL (<70 if symptomatic from PAD).  Aspirin '81mg'$  PO QD.  Atorvastatin 40-'80mg'$  PO QD (or other "high intensity" statin therapy).  Follow up with me in 9 months with repeat carotid duplex.  I encouraged him to follow-up with his primary care physician about his blood pressure issues.  CHIEF COMPLAINT: Follow-up after surgery  HISTORY OF PRESENT ILLNESS: [name] is a 81 y.o. male who returns to clinic for evaluation for carotid endarterectomy 10/01/2022 for symptomatic stenosis.  Patient reports no new focal neurologic symptoms.  His incision is healed well.  He reports no cranial nerve symptoms.  He has noticed some transient hypotension.  He has self discontinued one of his antihypertensive agents.  Past Medical History:  Diagnosis Date   Abdominal pain, epigastric    Allergic rhinitis, cause unspecified    Arthritis    BPH (benign prostatic hyperplasia)    CHF (congestive heart failure) (HCC)    Chronic anticoagulation    xarelto   Cognitive communication deficit 09/29/2022   Coronary artery calcification seen on CAT scan 12/26/2012   Depressive disorder, not elsewhere classified    Diabetes mellitus treated with oral medication ([place]) 09/28/2022   Diverticulosis of colon (without mention of hemorrhage)    Esophageal reflux    High risk medication use    Hyperlipidemia    Hyperplastic rectal polyp    Hypertension    Lumbago    Pacemaker    Paralyzed hemidiaphragm    LEFT   Persistent atrial fibrillation ([place])    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 ([place]) 09/29/2022   WHO class III pulmonary  hypertension due to chronic lung disease especially given the patient's history of COPD and 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   Sick sinus syndrome ([place]) 05/16/2009   s/p PPM ([employer]) by [name]   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 ([place]) 09/28/2022    Past Surgical History:  Procedure Laterality Date   CYSTOSCOPY WITH RETROGRADE PYELOGRAM, URETEROSCOPY AND STENT PLACEMENT Left 12/30/2021   Procedure: CYSTOSCOPY WITH RETROGRADE PYELOGRAM, URETEROSCOPY;  Surgeon: Remi Haggard, MD;  Location: [facility];  Service: Urology;  Laterality: Left;   ENDARTERECTOMY Right 10/01/2022   Procedure: ENDARTERECTOMY CAROTID;  Surgeon: Cherre Robins, MD;  [facility];  Service: Vascular;  Laterality: Right;   ESOPHAGOGASTRODUODENOSCOPY N/A 12/05/2013   Procedure: ESOPHAGOGASTRODUODENOSCOPY (EGD);  Surgeon: [name], MD;  Location: [facility];  Service: Endoscopy;  Laterality: N/A;   ESOPHAGOGASTRODUODENOSCOPY N/A 07/04/2015   Procedure: ESOPHAGOGASTRODUODENOSCOPY (EGD);  Surgeon: [name], MD;  Location: [facility];  Service: Endoscopy;  Laterality: N/A;   EXTRACORPOREAL SHOCK WAVE LITHOTRIPSY Left 10/13/2021   Procedure: EXTRACORPOREAL SHOCK WAVE LITHOTRIPSY (ESWL);  Surgeon: Lucas Mallow, MD;  Location: [facility];  Service: Urology;  Laterality: Left;  90 MINS   INSERT / REPLACE / REMOVE PACEMAKER  05/2009    [employer] [id number]  (serial number [id number]) dual-chamber pacemaker   LEAD REVISION/REPAIR N/A 10/15/2020   Procedure: LEAD REVISION/REPAIR;  Surgeon: Thompson Grayer, MD;  Location: [place] [facility];  Service: Cardiovascular;  Laterality: N/A;  PACEMAKER PLACEMENT Bilateral    '05   PATCH ANGIOPLASTY Right 10/01/2022   Procedure: RIGHT CAROTID ARTERY PATCH ANGIOPLASTY WITH 1CM x 6CM XENOSURE BIOLOGIC PATCH;  Surgeon: Cherre Robins, MD;  Location: [place];  Service: Vascular;  Laterality:  Right;   PPM GENERATOR CHANGEOUT N/A 10/15/2020   Procedure: PPM GENERATOR CHANGEOUT;  Surgeon: Thompson Grayer, MD;  Location: [place] [facility];  Service: Cardiovascular;  Laterality: N/A;   RIGHT HEART CATH N/A 09/09/2022   Procedure: RIGHT HEART CATH;  Surgeon: Early Osmond, MD;  Location: [place] [facility];  Service: Cardiovascular;  Laterality: N/A;    Family History  Problem Relation Age of Onset   Diabetes Mother    Hypertension Mother    Hypertension Father    Lung cancer Father    Cancer Father    Asthma Sister    Hypertension Sister    Arthritis Paternal Grandmother    Hypertension Brother    Hypertension Daughter    Hypertension Son     Social History   Socioeconomic History   Marital status: Married    Spouse name: Not on file   Number of children: 4   Years of education: Not on file   Highest education level: Not on file  Occupational History   Occupation: retired    Fish farm manager: RETIRED  Tobacco Use   Smoking status: Former    Packs/day: 0.50    Years: 20.00    Total pack years: 10.00    Types: Cigarettes    Quit date: 11/17/1991    Years since quitting: 30.9   Smokeless tobacco: Former    Types: Nurse, children's Use: Never used  Substance and Sexual Activity   Alcohol use: No   Drug use: No   Sexual activity: Never  Other Topics Concern   Not on file  Social History Narrative   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    Caffeine 1-2 day cups   Right handed   Tri-level home      Do you live at home alone?with wife              Social Determinants of Health   Financial Resource Strain: Low Risk  (08/03/2022)   Overall Financial Resource Strain (CARDIA)    Difficulty of Paying Living Expenses: Not hard at all  Food Insecurity: No Food Insecurity (10/02/2022)   Hunger Vital Sign    Worried About Running Out of Food in the Last Year: Never true    Ran Out of Food in the Last Year: Never true  Transportation Needs: No Transportation Needs  (10/02/2022)   PRAPARE - Hydrologist (Medical): No    Lack of Transportation (Non-Medical): No  Physical Activity: Inactive (08/03/2022)   Exercise Vital Sign    Days of Exercise per Week: 0 days    Minutes of Exercise per Session: 0 min  Stress: No Stress Concern Present (08/03/2022)   [place]    Feeling of Stress : Not at all  Social Connections: [place] (08/03/2022)   Social Connection and Isolation Panel [NHANES]    Frequency of Communication with Friends and Family: More than three times a week    Frequency of Social Gatherings with Friends and Family: More than three times a week    Attends Religious Services: More than 4 times per year    Active Member of [employer] or Organizations: Yes  Attends Archivist Meetings: More than 4 times per year    Marital Status: Married  Human resources officer Violence: Not At Risk (10/02/2022)   Humiliation, Afraid, Rape, and Kick questionnaire    Fear of Current or Ex-Partner: No    Emotionally Abused: No    Physically Abused: No    Sexually Abused: No    No Known Allergies  Current Outpatient Medications  Medication Sig Dispense Refill   acetaminophen (TYLENOL) 650 MG CR tablet Take 1,300 mg by mouth in the morning and at bedtime.     amLODipine (NORVASC) 5 MG tablet Take 1 tablet (5 mg total) by mouth daily. Resume home dose from 10/06/22     atorvastatin (LIPITOR) 80 MG tablet TAKE 1 TABLET BY MOUTH EVERY DAY (Patient taking differently: Take 80 mg by mouth daily.) 90 tablet 3   Budeson-Glycopyrrol-Formoterol (BREZTRI AEROSPHERE) 160-9-4.8 MCG/ACT AERO 1 puffs twice per day 10.7 g 11   Cholecalciferol (VITAMIN D3) 50 MCG (2000 UT) TABS Take 2,000 Units by mouth daily.     dorzolamide-timolol (COSOPT) 22.3-6.8 MG/ML ophthalmic solution Place 1 drop into both eyes in the morning and at bedtime.     ezetimibe (ZETIA) 10 MG tablet  Take 1 tablet (10 mg total) by mouth daily. 90 tablet 3   fexofenadine (ALLEGRA) 180 MG tablet TAKE 1 TABLET (180 MG TOTAL) BY MOUTH DAILY. (Patient taking differently: Take 180 mg by mouth daily.) 90 tablet 1   flecainide (TAMBOCOR) 100 MG tablet TAKE 1 TABLET BY MOUTH TWICE A DAY (Patient taking differently: Take 100 mg by mouth 2 (two) times daily.) 180 tablet 3   glipiZIDE (GLUCOTROL XL) 2.5 MG 24 hr tablet Take 1 tablet (2.5 mg total) by mouth daily with breakfast. 90 tablet 3   latanoprost (XALATAN) 0.005 % ophthalmic solution Place 1 drop into both eyes at bedtime.      meclizine (ANTIVERT) 12.5 MG tablet Take 1 tablet (12.5 mg total) by mouth 3 (three) times daily as needed for dizziness.     omeprazole (PRILOSEC) 40 MG capsule TAKE 1 CAPSULE BY MOUTH EVERY DAY AS NEEDED (Patient taking differently: Take 40 mg by mouth daily. TAKE 1 CAPSULE BY MOUTH EVERY DAY AS NEEDED) 90 capsule 2   psyllium (METAMUCIL) 0.52 g capsule Take 2 capsules by mouth daily.     rivaroxaban (XARELTO) 20 MG TABS tablet TAKE 1 TABLET BY MOUTH DAILY WITH SUPPER. (Patient taking differently: Take 20 mg by mouth daily with supper.) 90 tablet 3   tamsulosin (FLOMAX) 0.4 MG CAPS capsule TAKE 1 CAPSULE BY MOUTH TWICE A DAY (Patient taking differently: Take 0.4 mg by mouth 2 (two) times daily.) 180 capsule 3   telmisartan (MICARDIS) 20 MG tablet Take 1 tablet (20 mg total) by mouth at bedtime. Resume from 10/06/22     traMADol (ULTRAM) 50 MG tablet TAKE 1 TABLET BY MOUTH EVERY 6 HOURS AS NEEDED FOR PAIN 120 tablet 2   No current facility-administered medications for this visit.    PHYSICAL EXAM There were no vitals filed for this visit.  Well-appearing elderly man in no acute distress Regular rate and rhythm Unlabored breathing Right neck well-healed without issue. No cranial nerve abnormalities No new focal neurologic deficits  PERTINENT LABORATORY AND RADIOLOGIC DATA  Most recent CBC    Latest Ref Rng & Units  10/02/2022    4:20 AM 10/01/2022    8:07 AM 10/01/2022    6:17 AM  CBC  WBC 4.0 - 10.5 K/uL 13.2  7.4   Hemoglobin 13.0 - 17.0 g/dL 13.0  13.6  14.7   Hematocrit 39.0 - 52.0 % 39.8  40.0  45.5   Platelets 150 - 400 K/uL 129   147      Most recent CMP    Latest Ref Rng & Units 10/02/2022    4:20 AM 10/01/2022    8:07 AM 10/01/2022    6:17 AM  CMP  Glucose 70 - 99 mg/dL 136   103   BUN 8 - 23 mg/dL 14   12   Creatinine 0.61 - 1.24 mg/dL 1.13   1.04   Sodium 135 - 145 mmol/L 141  140  141   Potassium 3.5 - 5.1 mmol/L 4.0  3.8  3.9   Chloride 98 - 111 mmol/L 107   105   CO2 22 - 32 mmol/L 21   21   Calcium 8.9 - 10.3 mg/dL 9.2   9.4     Renal function CrCl cannot be calculated (Patient's most recent lab result is older than the maximum 21 days allowed.).  Hgb A1c MFr Bld (%)  Date Value  09/28/2022 6.1 (H)    LDL Chol Calc (NIH)  Date Value Ref Range Status  08/12/2022 52 0 - 99 mg/dL Final   LDL Cholesterol  Date Value Ref Range Status  10/02/2022 55 0 - 99 mg/dL Final    Comment:           Total Cholesterol/HDL:CHD Risk Coronary Heart Disease Risk Table                     Men   Women  1/2 Average Risk   3.4   3.3  Average Risk       5.0   4.4  2 X Average Risk   9.6   7.1  3 X Average Risk  23.4   11.0        Use the calculated Patient Ratio above and the CHD Risk Table to determine the patient's CHD Risk.        ATP III CLASSIFICATION (LDL):  <100     mg/dL   Optimal  100-129  mg/dL   Near or Above                    Optimal  130-159  mg/dL   Borderline  160-189  mg/dL   High  >190     mg/dL   Very High Performed at [facility] [address] [place], [place] [address]    Direct LDL  Date Value Ref Range Status  12/26/2013 183.4 mg/dL Final    Comment:    Optimal:  <100 mg/dLNear or Above Optimal:  100-129 mg/dLBorderline High:  130-159 mg/dLHigh:  160-189 mg/dLVery High:  >190 mg/dL     Vascular Imaging: Performed today shows good  technical result from endarterectomy with no residual stenosis.  Yevonne Aline. Stanford Breed, MD FACS [facility] Phone Number: [phone] 10/26/2022 10:04 AM   Total time spent on preparing this encounter including chart review, data review, collecting history, examining the patient, coordinating care for this established patient, 20 minutes.  Portions of this report may have been transcribed using voice recognition software.  Every effort has been made to ensure accuracy; however, inadvertent computerized transcription errors may still be present.

## 2022-10-27 ENCOUNTER — Encounter: Payer: Self-pay | Admitting: Vascular Surgery

## 2022-10-27 ENCOUNTER — Ambulatory Visit (HOSPITAL_COMMUNITY)
Admission: RE | Admit: 2022-10-27 | Discharge: 2022-10-27 | Disposition: A | Discharge status: Home / Self Care | Payer: Medicare Other | Source: Ambulatory Visit | Attending: Vascular Surgery | Admitting: Vascular Surgery

## 2022-10-27 ENCOUNTER — Ambulatory Visit (INDEPENDENT_AMBULATORY_CARE_PROVIDER_SITE_OTHER): Payer: Medicare Other | Admitting: Vascular Surgery

## 2022-10-27 VITALS — BP 120/82 | HR 60 | Temp 98.1°F | Resp 20 | Ht 69.0 in | Wt 205.0 lb

## 2022-10-27 DIAGNOSIS — I6529 Occlusion and stenosis of unspecified carotid artery: Secondary | ICD-10-CM | POA: Insufficient documentation

## 2022-10-27 DIAGNOSIS — Z9889 Other specified postprocedural states: Secondary | ICD-10-CM

## 2022-10-28 ENCOUNTER — Telehealth: Payer: Self-pay | Admitting: Internal Medicine

## 2022-10-28 NOTE — Telephone Encounter (Signed)
This is unusual, but is a known side effect of this medication.  All I can say is to recommend staying off the flomax for now.  thanks

## 2022-10-28 NOTE — Telephone Encounter (Signed)
Patient states that he was discharged from the hospital on 12/17 and was instructed to start back taking his blood pressure medication. He started the    tamsulosin (FLOMAX) 0.4 MG CAPS capsule  but then his blood pressure was too low. He has since stopped taking it and reports that his BP is leveled out. He would like a call back to know if there is anything else he should be doing. Call back number [phone].

## 2022-10-28 NOTE — Telephone Encounter (Signed)
Please advise as patient was seen at ED and was informed to restart Tamsulosin and has caused low bp once started and leveled back to normal once stopped.

## 2022-10-29 DIAGNOSIS — R29898 Other symptoms and signs involving the musculoskeletal system: Secondary | ICD-10-CM | POA: Diagnosis not present

## 2022-10-29 DIAGNOSIS — I639 Cerebral infarction, unspecified: Secondary | ICD-10-CM | POA: Diagnosis not present

## 2022-10-29 DIAGNOSIS — R42 Dizziness and giddiness: Secondary | ICD-10-CM | POA: Diagnosis not present

## 2022-10-29 DIAGNOSIS — R200 Anesthesia of skin: Secondary | ICD-10-CM | POA: Diagnosis not present

## 2022-10-29 DIAGNOSIS — M6281 Muscle weakness (generalized): Secondary | ICD-10-CM | POA: Diagnosis not present

## 2022-10-29 NOTE — Telephone Encounter (Signed)
Informed patient that it was a known side affect of this medication and that it is recommended to stay off of it for now. Patient expressed understanding and will continue to monitor bp.

## 2022-11-03 DIAGNOSIS — R29898 Other symptoms and signs involving the musculoskeletal system: Secondary | ICD-10-CM | POA: Diagnosis not present

## 2022-11-03 DIAGNOSIS — R200 Anesthesia of skin: Secondary | ICD-10-CM | POA: Diagnosis not present

## 2022-11-03 DIAGNOSIS — I639 Cerebral infarction, unspecified: Secondary | ICD-10-CM | POA: Diagnosis not present

## 2022-11-03 DIAGNOSIS — R42 Dizziness and giddiness: Secondary | ICD-10-CM | POA: Diagnosis not present

## 2022-11-03 DIAGNOSIS — M6281 Muscle weakness (generalized): Secondary | ICD-10-CM | POA: Diagnosis not present

## 2022-11-05 DIAGNOSIS — R42 Dizziness and giddiness: Secondary | ICD-10-CM | POA: Diagnosis not present

## 2022-11-05 DIAGNOSIS — R200 Anesthesia of skin: Secondary | ICD-10-CM | POA: Diagnosis not present

## 2022-11-05 DIAGNOSIS — M6281 Muscle weakness (generalized): Secondary | ICD-10-CM | POA: Diagnosis not present

## 2022-11-05 DIAGNOSIS — R29898 Other symptoms and signs involving the musculoskeletal system: Secondary | ICD-10-CM | POA: Diagnosis not present

## 2022-11-05 DIAGNOSIS — I639 Cerebral infarction, unspecified: Secondary | ICD-10-CM | POA: Diagnosis not present

## 2022-11-10 ENCOUNTER — Other Ambulatory Visit: Payer: Self-pay

## 2022-11-10 ENCOUNTER — Other Ambulatory Visit: Payer: Self-pay | Admitting: Internal Medicine

## 2022-11-10 DIAGNOSIS — K219 Gastro-esophageal reflux disease without esophagitis: Secondary | ICD-10-CM

## 2022-11-10 DIAGNOSIS — I6529 Occlusion and stenosis of unspecified carotid artery: Secondary | ICD-10-CM

## 2022-11-11 ENCOUNTER — Telehealth: Payer: Self-pay

## 2022-11-11 NOTE — Telephone Encounter (Signed)
PA started  Key: [id number]

## 2022-11-12 DIAGNOSIS — R42 Dizziness and giddiness: Secondary | ICD-10-CM | POA: Diagnosis not present

## 2022-11-12 DIAGNOSIS — R200 Anesthesia of skin: Secondary | ICD-10-CM | POA: Diagnosis not present

## 2022-11-12 DIAGNOSIS — R29898 Other symptoms and signs involving the musculoskeletal system: Secondary | ICD-10-CM | POA: Diagnosis not present

## 2022-11-12 DIAGNOSIS — I639 Cerebral infarction, unspecified: Secondary | ICD-10-CM | POA: Diagnosis not present

## 2022-11-12 DIAGNOSIS — M6281 Muscle weakness (generalized): Secondary | ICD-10-CM | POA: Diagnosis not present

## 2022-11-13 NOTE — Progress Notes (Signed)
Remote pacemaker transmission.   

## 2022-11-19 ENCOUNTER — Telehealth: Payer: Self-pay

## 2022-11-19 NOTE — Patient Outreach (Signed)
  Care Coordination   11/19/2022 Name: IORI GIGANTE MRN: [id number] DOB: 11/21/1940   Care Coordination Outreach Attempts:  An unsuccessful telephone outreach was attempted today to offer the patient information about available care coordination services as a benefit of their health plan.   Follow Up Plan:  Additional outreach attempts will be made to offer the patient care coordination information and services.   Encounter Outcome:  No Answer   Care Coordination Interventions:  No, not indicated    Thea Silversmith, RN, MSN, BSN, [place] Coordinator [phone]

## 2022-11-23 ENCOUNTER — Ambulatory Visit: Payer: Medicare Other | Admitting: Internal Medicine

## 2022-11-24 ENCOUNTER — Encounter: Payer: Self-pay | Admitting: Internal Medicine

## 2022-11-25 ENCOUNTER — Other Ambulatory Visit: Payer: Self-pay | Admitting: Internal Medicine

## 2022-11-25 ENCOUNTER — Encounter: Payer: Self-pay | Admitting: Internal Medicine

## 2022-11-25 ENCOUNTER — Ambulatory Visit (INDEPENDENT_AMBULATORY_CARE_PROVIDER_SITE_OTHER): Payer: Medicare Other | Admitting: Internal Medicine

## 2022-11-25 VITALS — BP 124/80 | HR 80 | Temp 98.9°F | Ht 69.0 in | Wt 201.0 lb

## 2022-11-25 DIAGNOSIS — J449 Chronic obstructive pulmonary disease, unspecified: Secondary | ICD-10-CM

## 2022-11-25 DIAGNOSIS — B370 Candidal stomatitis: Secondary | ICD-10-CM

## 2022-11-25 DIAGNOSIS — U071 COVID-19: Secondary | ICD-10-CM | POA: Insufficient documentation

## 2022-11-25 DIAGNOSIS — R0981 Nasal congestion: Secondary | ICD-10-CM | POA: Diagnosis not present

## 2022-11-25 DIAGNOSIS — R739 Hyperglycemia, unspecified: Secondary | ICD-10-CM | POA: Diagnosis not present

## 2022-11-25 LAB — POC INFLUENZA A&B (BINAX/QUICKVUE)
Influenza A, POC: NEGATIVE
Influenza B, POC: NEGATIVE

## 2022-11-25 LAB — POC SOFIA SARS ANTIGEN FIA: SARS Coronavirus 2 Ag: POSITIVE — AB

## 2022-11-25 LAB — POCT RESPIRATORY SYNCYTIAL VIRUS: RSV Rapid Ag: NEGATIVE

## 2022-11-25 MED ORDER — NYSTATIN 100000 UNIT/ML MT SUSP: 500000.0000 [IU] | Freq: Four times a day (QID) | OROMUCOSAL | Status: AC

## 2022-11-25 MED ORDER — HYDROCODONE BIT-HOMATROP MBR 5-1.5 MG/5ML PO SOLN: 5.0000 mL | Freq: Four times a day (QID) | ORAL | 0 refills | Status: AC | PRN

## 2022-11-25 MED ORDER — NIRMATRELVIR/RITONAVIR (PAXLOVID)TABLET: 3.0000 | ORAL_TABLET | Freq: Two times a day (BID) | ORAL | 0 refills | Status: DC

## 2022-11-25 NOTE — Assessment & Plan Note (Signed)
Mild to mod, for nystatin susp asd,   to f/u any worsening symptoms or concerns

## 2022-11-25 NOTE — Assessment & Plan Note (Signed)
Lab Results  Component Value Date   HGBA1C 6.1 (H) 09/28/2022   Stable, pt to continue current medical treatment  - diet, wt control

## 2022-11-25 NOTE — Assessment & Plan Note (Signed)
Recent onset symptoms, for paxlovid course x 5 days, cough med prn, isolate for 5 days,  to f/u any worsening symptoms or concerns

## 2022-11-25 NOTE — Patient Instructions (Signed)
Please take all new medication as prescribed - - the antibiotic paxlovid, cough medicine, and nystatin solution as directed  Please continue all other medications as before, and refills have been done if requested.  Please have the pharmacy call with any other refills you may need.  Please keep your appointments with your specialists as you may have planned

## 2022-11-25 NOTE — Telephone Encounter (Signed)
Patient seen in clinic 11/25/22, covid positive

## 2022-11-25 NOTE — Progress Notes (Signed)
Patient ID: [name], male   DOB: October 10, 1941, 82 y.o.   MRN: [id number]        Chief Complaint: follow up COVID infection, hyperglycemia , copd,,thrush        HPI:  [name] is a 82 y.o. male here with c/o 4-5 days onset URI like symptoms with pain and congestoin, but intact taste and smell, worsening scant prod cough, but Pt denies chest pain, increased sob or doe, wheezing, orthopnea, PND, increased LE swelling, palpitations, dizziness or syncope.   Pt denies polydipsia, polyuria, or new focal neuro s/s.    Pt denies recent unusual wt loss, night sweats, loss of appetite, or other constitutional symptoms  Also with incidental rash to dorsal tongue off white for over 1 wk.       Wt Readings from Last 3 Encounters:  11/25/22 201 lb (91.2 kg)  10/27/22 205 lb (93 kg)  10/14/22 200 lb (90.7 kg)   BP Readings from Last 3 Encounters:  11/25/22 124/80  10/27/22 120/82  10/14/22 122/77         Past Medical History:  Diagnosis Date   Abdominal pain, epigastric    Allergic rhinitis, cause unspecified    Arthritis    BPH (benign prostatic hyperplasia)    CHF (congestive heart failure) ([place])    Chronic anticoagulation    xarelto   Cognitive communication deficit 09/29/2022   Coronary artery calcification seen on CAT scan 12/26/2012   Depressive disorder, not elsewhere classified    Diabetes mellitus treated with oral medication ([place]) 09/28/2022   Diverticulosis of colon (without mention of hemorrhage)    Esophageal reflux    High risk medication use    Hyperlipidemia    Hyperplastic rectal polyp    Hypertension    Lumbago    Pacemaker    Paralyzed hemidiaphragm    LEFT   Persistent atrial fibrillation ([place])    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 ([place]) 09/29/2022   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 especially given the patient's history of COPD and 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   Sick sinus syndrome ([place]) 05/16/2009    s/p PPM ([employer]) by [facility]   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 ([place]) 09/28/2022   Past Surgical History:  Procedure Laterality Date   CYSTOSCOPY WITH RETROGRADE PYELOGRAM, URETEROSCOPY AND STENT PLACEMENT Left 12/30/2021   Procedure: CYSTOSCOPY WITH RETROGRADE PYELOGRAM, URETEROSCOPY;  Surgeon: Remi Haggard, MD;  [facility];  Service: Urology;  Laterality: Left;   ENDARTERECTOMY Right 10/01/2022   Procedure: ENDARTERECTOMY CAROTID;  Surgeon: Cherre Robins, MD;  Location: [facility];  Service: Vascular;  Laterality: Right;   ESOPHAGOGASTRODUODENOSCOPY N/A 12/05/2013   Procedure: ESOPHAGOGASTRODUODENOSCOPY (EGD);  Surgeon: [name], MD;  [facility];  Service: Endoscopy;  Laterality: N/A;   ESOPHAGOGASTRODUODENOSCOPY N/A 07/04/2015   Procedure: ESOPHAGOGASTRODUODENOSCOPY (EGD);  Surgeon: [name], MD;  Location: [place];  Service: Endoscopy;  Laterality: N/A;   EXTRACORPOREAL SHOCK WAVE LITHOTRIPSY Left 10/13/2021   Procedure: EXTRACORPOREAL SHOCK WAVE LITHOTRIPSY (ESWL);  Surgeon: Lucas Mallow, MD;  Location: [facility];  Service: Urology;  Laterality: Left;  90 MINS   INSERT / REPLACE / REMOVE PACEMAKER  05/2009    [employer] [id number]  (serial number [id number]) dual-chamber pacemaker   LEAD REVISION/REPAIR N/A 10/15/2020   Procedure: LEAD REVISION/REPAIR;  Surgeon: Thompson Grayer, MD;  Location: [place] CV LAB;  Service: Cardiovascular;  Laterality: N/A;   PACEMAKER PLACEMENT Bilateral    '05   PATCH ANGIOPLASTY Right 10/01/2022   Procedure: RIGHT CAROTID ARTERY PATCH ANGIOPLASTY WITH 1CM x 6CM XENOSURE BIOLOGIC PATCH;  Surgeon: Cherre Robins, MD;  Location: [place];  Service: Vascular;  Laterality: Right;   PPM GENERATOR CHANGEOUT N/A 10/15/2020   Procedure: PPM GENERATOR CHANGEOUT;  Surgeon: Thompson Grayer, MD;  Location: [place] CV LAB;  Service: Cardiovascular;  Laterality: N/A;    RIGHT HEART CATH N/A 09/09/2022   Procedure: RIGHT HEART CATH;  Surgeon: Early Osmond, MD;  Location: [place] CV LAB;  Service: Cardiovascular;  Laterality: N/A;    reports that he quit smoking about 31 years ago. His smoking use included cigarettes. He has a 10.00 pack-year smoking history. He has quit using smokeless tobacco.  His smokeless tobacco use included chew. He reports that he does not drink alcohol and does not use drugs. family history includes Arthritis in his paternal grandmother; Asthma in his sister; Cancer in his father; Diabetes in his mother; Hypertension in his brother, daughter, father, mother, sister, and son; Lung cancer in his father. No Known Allergies Current Outpatient Medications on File Prior to Visit  Medication Sig Dispense Refill   acetaminophen (TYLENOL) 650 MG CR tablet Take 1,300 mg by mouth in the morning and at bedtime.     amLODipine (NORVASC) 5 MG tablet Take 1 tablet (5 mg total) by mouth daily. Resume home dose from 10/06/22     atorvastatin (LIPITOR) 80 MG tablet TAKE 1 TABLET BY MOUTH EVERY DAY (Patient taking differently: Take 80 mg by mouth daily.) 90 tablet 3   Budeson-Glycopyrrol-Formoterol (BREZTRI AEROSPHERE) 160-9-4.8 MCG/ACT AERO 1 puffs twice per day 10.7 g 11   Cholecalciferol (VITAMIN D3) 50 MCG (2000 UT) TABS Take 2,000 Units by mouth daily.     dorzolamide-timolol (COSOPT) 22.3-6.8 MG/ML ophthalmic solution Place 1 drop into both eyes in the morning and at bedtime.     ezetimibe (ZETIA) 10 MG tablet Take 1 tablet (10 mg total) by mouth daily. 90 tablet 3   fexofenadine (ALLEGRA) 180 MG tablet TAKE 1 TABLET (180 MG TOTAL) BY MOUTH DAILY. (Patient taking differently: Take 180 mg by mouth daily.) 90 tablet 1   flecainide (TAMBOCOR) 100 MG tablet TAKE 1 TABLET BY MOUTH TWICE A DAY (Patient taking differently: Take 100 mg by mouth 2 (two) times daily.) 180 tablet 3   glipiZIDE (GLUCOTROL XL) 2.5 MG 24 hr tablet Take 1 tablet (2.5 mg total)  by mouth daily with breakfast. 90 tablet 3   latanoprost (XALATAN) 0.005 % ophthalmic solution Place 1 drop into both eyes at bedtime.      meclizine (ANTIVERT) 12.5 MG tablet Take 1 tablet (12.5 mg total) by mouth 3 (three) times daily as needed for dizziness.     omeprazole (PRILOSEC) 40 MG capsule TAKE 1 CAPSULE BY MOUTH EVERY DAY AS NEEDED 90 capsule 2   psyllium (METAMUCIL) 0.52 g capsule Take 2 capsules by mouth daily.     rivaroxaban (XARELTO) 20 MG TABS tablet TAKE 1 TABLET BY MOUTH DAILY WITH SUPPER. (Patient taking differently: Take 20 mg by mouth daily with supper.) 90 tablet 3   tamsulosin (FLOMAX) 0.4 MG CAPS capsule TAKE 1 CAPSULE BY MOUTH TWICE A DAY (Patient taking differently: Take 0.4 mg by mouth 2 (two) times daily.) 180 capsule 3   telmisartan (MICARDIS) 20 MG tablet Take 1 tablet (20 mg total) by mouth at bedtime. Resume from  10/06/22     traMADol (ULTRAM) 50 MG tablet TAKE 1 TABLET BY MOUTH EVERY 6 HOURS AS NEEDED FOR PAIN 120 tablet 2   No current facility-administered medications on file prior to visit.        ROS:  All others reviewed and negative.  Objective        PE:  BP 124/80 (BP Location: Left Arm, Patient Position: Sitting, Cuff Size: Large)   Pulse 80   Temp 98.9 F (37.2 C) (Oral)   Ht '5\' 9"'$  (1.753 m)   Wt 201 lb (91.2 kg)   SpO2 93%   BMI 29.68 kg/m                 Constitutional: Pt appears in NAD               HENT: Head: NCAT.                Right Ear: External ear normal.                 Left Ear: External ear normal.  Bilat tm's with mild erythema.  Max sinus areas mild tender.  Pharynx with mild erythema, no exudate               Eyes: . Pupils are equal, round, and reactive to light. Conjunctivae and EOM are normal               Nose: without d/c or deformity               Neck: Neck supple. Gross normal ROM; throat with tongue rash whitish               Cardiovascular: Normal rate and regular rhythm.                 Pulmonary/Chest: Effort  normal and breath sounds without rales or wheezing.                Abd:  Soft, NT, ND, + BS, no organomegaly               Neurological: Pt is alert. At baseline orientation, motor grossly intact               Skin: Skin is warm. No rashes, no other new lesions, LE edema  none               Psychiatric: Pt behavior is normal without agitation   Micro: none  Cardiac tracings I have personally interpreted today:  none  Pertinent Radiological findings (summarize): none   Lab Results  Component Value Date   WBC 13.2 (H) 10/02/2022   HGB 13.0 10/02/2022   HCT 39.8 10/02/2022   PLT 129 (L) 10/02/2022   GLUCOSE 136 (H) 10/02/2022   CHOL 116 10/02/2022   TRIG 45 10/02/2022   HDL 52 10/02/2022   LDLDIRECT 183.4 12/26/2013   LDLCALC 55 10/02/2022   ALT 26 09/26/2022   AST 20 09/26/2022   NA 141 10/02/2022   K 4.0 10/02/2022   CL 107 10/02/2022   CREATININE 1.13 10/02/2022   BUN 14 10/02/2022   CO2 21 (L) 10/02/2022   TSH 1.29 06/01/2022   PSA 2.01 11/24/2021   INR 2.8 (H) 09/26/2022   HGBA1C 6.1 (H) 09/28/2022   POCT - COVID positive  Assessment/Plan:  [name] is a 82 y.o. Black or African American [2] male with  has a past medical history of Abdominal pain, epigastric,  Allergic rhinitis, cause unspecified, Arthritis, BPH (benign prostatic hyperplasia), CHF (congestive heart failure) ([place]), Chronic anticoagulation, Cognitive communication deficit (09/29/2022), Coronary artery calcification seen on CAT scan (12/26/2012), Depressive disorder, not elsewhere classified, Diabetes mellitus treated with oral medication ([place]) (09/28/2022), Diverticulosis of colon (without mention of hemorrhage), Esophageal reflux, High risk medication use, Hyperlipidemia, Hyperplastic rectal polyp, Hypertension, Lumbago, Pacemaker, Paralyzed hemidiaphragm, Persistent atrial fibrillation ([place]), 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  COPD GOLD III with marked reversibility  Stable overall, continue albuterol inhaler prn  Hyperglycemia Lab Results  Component Value Date   HGBA1C 6.1 (H) 09/28/2022   Stable, pt to continue current medical treatment  - diet, wt control   COVID-19 virus infection Recent onset symptoms, for paxlovid course x 5 days, cough med prn, isolate for 5 days,  to f/u any worsening symptoms or concerns  Thrush Mild to mod, for nystatin susp asd,   to f/u any worsening symptoms or concerns  Followup: Return if symptoms worsen or fail to improve.  Cathlean Cower, MD 11/25/2022 11:14 AM [facility]

## 2022-11-25 NOTE — Assessment & Plan Note (Signed)
Stable overall, continue albuterol inhaler prn

## 2022-12-01 ENCOUNTER — Ambulatory Visit: Payer: Medicare Other | Admitting: Internal Medicine

## 2022-12-09 ENCOUNTER — Other Ambulatory Visit: Payer: Self-pay | Admitting: Internal Medicine

## 2022-12-10 NOTE — Progress Notes (Signed)
Cardiology Office Note Date:  12/11/2022  Patient ID:  [name], [name] 09-28-1941, MRN [id number] PCP:  Biagio Borg, MD  Cardiologist:  Early Osmond, MD Electrophysiologist: Thompson Grayer, MD >> [name] Quitter, MD  Chief Complaint: 82y.o. follow-up, past due  History of Present Illness: [name] WITHERSPOONis a 82y.o. male with PMH notable for SND s/p PPM, Chronic diastolic HF, parox Afib, CVA, HTN; seen today for AMelida Quitter MD for routine electrophysiology followup.  Last saw PA [facility] 11/2021 for acute visit for increased lower extremity edema. Had kidney stones and was drinking more water to flush stone. Lasix started PRN. In Nov 2023, found to have a stroke d/t stenosis of R ICA, s/p carotid endarterectomy.  Beginning of 2024, developed covid after going to his son's wedding.   From a heart perspective, he has no concerns or complaints. Has residual L arm weakness from CVA, had to stop going to therapy when covid positive, but will now return. He has some dependent edema, takes lasix PRN for edema and/or weight gain, takes about 3-4 times /week.  He diligently takes xarelto daily, no bleeding concerns.     he denies chest pain, palpitations, dyspnea, PND, orthopnea, nausea, vomiting, dizziness, syncope, edema, weight gain, or early satiety.    Device Information: BSCi dual chamber PPM, impl 06/03/2009 gen change 10/15/2020 with new RV lead implant HE HAS AN ABANDONED RV LEAD  Past Medical History:  Diagnosis Date   Abdominal pain, epigastric    Allergic rhinitis, cause unspecified    Arthritis    BPH (benign prostatic hyperplasia)    CHF (congestive heart failure) (H[facility])    Chronic anticoagulation    xarelto   Cognitive communication deficit 09/29/2022   Coronary artery calcification seen on CAT scan 12/26/2012   Depressive disorder, not elsewhere classified    Diabetes mellitus treated with oral medication ([place] 09/28/2022   Diverticulosis of colon (without mention of  hemorrhage)    Esophageal reflux    High risk medication use    Hyperlipidemia    Hyperplastic rectal polyp    Hypertension    Lumbago    Pacemaker    Paralyzed hemidiaphragm    LEFT   Persistent atrial fibrillation ([place]    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 ([place] 09/29/2022   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 especially given the patient's history of COPD and 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   Sick sinus syndrome ([place] 05/16/2009   s/p PPM ([employer]) by [employer]  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 ([place] 09/28/2022    Past Surgical History:  Procedure Laterality Date   CYSTOSCOPY WITH RETROGRADE PYELOGRAM, URETEROSCOPY AND STENT PLACEMENT Left 12/30/2021   Procedure: CYSTOSCOPY WITH RETROGRADE PYELOGRAM, URETEROSCOPY;  Surgeon: NRemi Haggard MD;  [facility]  Service: Urology;  Laterality: Left;   ENDARTERECTOMY Right 10/01/2022   Procedure: ENDARTERECTOMY CAROTID;  Surgeon: HCherre Robins MD;  [facility];  Service: Vascular;  Laterality: Right;   ESOPHAGOGASTRODUODENOSCOPY N/A 12/05/2013   Procedure: ESOPHAGOGASTRODUODENOSCOPY (EGD);  Surgeon: DLafayette Dragon MD;  [facility];  Service: Endoscopy;  Laterality: N/A;   ESOPHAGOGASTRODUODENOSCOPY N/A 07/04/2015   Procedure: ESOPHAGOGASTRODUODENOSCOPY (EGD);  Surgeon: RInda Castle MD;  [place]  Service: Endoscopy;  Laterality: N/A;   EXTRACORPOREAL SHOCK WAVE LITHOTRIPSY Left 10/13/2021   Procedure: EXTRACORPOREAL SHOCK WAVE LITHOTRIPSY (ESWL);  Surgeon: BLucas Mallow MD;  Location: [place];  Service: Urology;  Laterality: Left;  90 MINS   INSERT / REPLACE / REMOVE PACEMAKER  05/2009    [employer] [id number]  (serial number [id number]) dual-chamber pacemaker   LEAD REVISION/REPAIR N/A 10/15/2020   Procedure: LEAD REVISION/REPAIR;  Surgeon: Thompson Grayer, MD;  Location: [place] [facility] LAB;  Service: Cardiovascular;  Laterality: N/A;   PACEMAKER PLACEMENT Bilateral    '05   PATCH ANGIOPLASTY Right 10/01/2022   Procedure: RIGHT CAROTID ARTERY PATCH ANGIOPLASTY WITH 1CM x 6CM XENOSURE BIOLOGIC PATCH;  Surgeon: Cherre Robins, MD;  Location: [place];  Service: Vascular;  Laterality: Right;   PPM GENERATOR CHANGEOUT N/A 10/15/2020   Procedure: PPM GENERATOR CHANGEOUT;  Surgeon: Thompson Grayer, MD;  Location: [place] [facility];  Service: Cardiovascular;  Laterality: N/A;   RIGHT HEART CATH N/A 09/09/2022   Procedure: RIGHT HEART CATH;  Surgeon: Early Osmond, MD;  Location: [place] [facility];  Service: Cardiovascular;  Laterality: N/A;    Current Outpatient Medications  Medication Instructions   acetaminophen (TYLENOL) 1,300 mg, Oral, 2 times daily   amLODipine (NORVASC) 5 mg, Oral, Daily, Resume home dose from 10/06/22   atorvastatin (LIPITOR) 80 MG tablet TAKE 1 TABLET BY MOUTH EVERY DAY   Budeson-Glycopyrrol-Formoterol (BREZTRI AEROSPHERE) 160-9-4.8 MCG/ACT AERO 1 puffs twice per day   dorzolamide-timolol (COSOPT) 22.3-6.8 MG/ML ophthalmic solution 1 drop, Both Eyes, 2 times daily   ezetimibe (ZETIA) 10 mg, Oral, Daily   fexofenadine (ALLEGRA) 180 MG tablet TAKE 1 TABLET (180 MG TOTAL) BY MOUTH DAILY.   flecainide (TAMBOCOR) 100 MG tablet TAKE 1 TABLET BY MOUTH TWICE A DAY   glipiZIDE (GLUCOTROL XL) 2.5 mg, Oral, Daily with breakfast   latanoprost (XALATAN) 0.005 % ophthalmic solution 1 drop, Both Eyes, Daily at bedtime   meclizine (ANTIVERT) 12.5 mg, Oral, Every 8 hours PRN   nystatin (MYCOSTATIN) 100000 UNIT/ML suspension Oral   omeprazole (PRILOSEC) 40 MG capsule TAKE 1 CAPSULE BY MOUTH EVERY DAY AS NEEDED   psyllium (METAMUCIL) 0.52 g capsule 2 capsules, Oral, Daily   tamsulosin (FLOMAX) 0.4 MG CAPS capsule TAKE 1 CAPSULE BY MOUTH TWICE A DAY   telmisartan (MICARDIS) 20 mg, Oral, Daily at bedtime, Resume from 10/06/22   traMADol (ULTRAM) 50 MG tablet  TAKE 1 TABLET BY MOUTH EVERY 6 HOURS AS NEEDED FOR PAIN   Vitamin D3 2,000 Units, Oral, Daily   XARELTO 20 MG TABS tablet TAKE 1 TABLET BY MOUTH DAILY WITH SUPPER    Social History:  The patient  reports that he quit smoking about 31 years ago. His smoking use included cigarettes. He has a 10.00 pack-year smoking history. He has quit using smokeless tobacco.  His smokeless tobacco use included chew. He reports that he does not drink alcohol and does not use drugs.   Family History:  The patient's family history includes Arthritis in his paternal grandmother; Asthma in his sister; Cancer in his father; Diabetes in his mother; Hypertension in his brother, daughter, father, mother, sister, and son; Lung cancer in his father.  ROS:  Please see the history of present illness. All other systems are reviewed and otherwise negative.   PHYSICAL EXAM:  VS:  BP 100/70   Pulse 60   Ht '5\' 9"'$  (1.753 m)   Wt 200 lb 12.8 oz (91.1 kg)   SpO2 93%   BMI 29.65 kg/m  BMI: Body mass index is 29.65 kg/m.  GEN- The patient is well appearing, alert  and oriented x 3 today.   HEENT: normocephalic, atraumatic; sclera clear, conjunctiva pink; hearing intact; oropharynx clear; neck supple, no JVP Lungs- Clear to ausculation bilaterally, normal work of breathing.  No wheezes, rales, rhonchi Heart- Regular rate and rhythm, no murmurs, rubs or gallops, PMI not laterally displaced GI- soft, non-tender, non-distended, bowel sounds present, no hepatosplenomegaly Extremities- 1+ peripheral edema. no clubbing or cyanosis; DP/PT/radial pulses 2+ bilaterally MS- no significant deformity or atrophy Skin- warm and dry, no rash or lesion, device pocket well-healed Psych- euthymic mood, full affect Neuro- strength and sensation are intact  Device interrogation done today and reviewed by myself:  Battery good  Lead thresholds, impedence, sensing stable Minimal AF episodes, known No changes made today  EKG is ordered.  Personal review of EKG from today shows: AV paced, rate 60bpm, RBBB  Recent Labs: 05/18/2022: B Natriuretic Peptide 11.9 06/01/2022: Pro B Natriuretic peptide (BNP) 19.0; TSH 1.29 09/26/2022: ALT 26 10/01/2022: Magnesium 1.9 10/02/2022: BUN 14; Creatinine, Ser 1.13; Hemoglobin 13.0; Platelets 129; Potassium 4.0; Sodium 141  10/02/2022: Cholesterol 116; HDL 52; LDL Cholesterol 55; Total CHOL/HDL Ratio 2.2; Triglycerides 45; VLDL 9   CrCl cannot be calculated (Patient's most recent lab result is older than the maximum 21 days allowed.).   Wt Readings from Last 3 Encounters:  12/11/22 200 lb 12.8 oz (91.1 kg)  11/25/22 201 lb (91.2 kg)  10/27/22 205 lb (93 kg)     Additional studies reviewed include: Previous EP, cardiology notes.  [place] 09/09/2022 1.  Mild pulmonary hypertension with a mean PA pressure of 28 mmHg. 2.  Mean RA pressure of 8 mmHg, RV pressure of 43/6 with RV end-diastolic pressure of 12 mmHg, mean wedge pressure of 7 mmHg, and PA pressure 45/24 with a mean of 28 mmHg. 3.  Fick cardiac output of 4.28 L/min and Fick cardiac index of 2.05 L/min/m. 4.  PVR of 4.75 Woods units. 5.  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  TTE 08/12/2022  1. Left ventricular ejection fraction, by estimation, is 55%. The left ventricle has normal function. The left ventricle has no regional wall motion abnormalities. Left ventricular diastolic parameters are consistent with Grade I diastolic dysfunction (impaired relaxation).  2. Right ventricular systolic function is mildly reduced. The right ventricular size is mildly enlarged. There is mildly elevated pulmonary artery systolic pressure. The estimated right ventricular systolic  pressure is 56.3 mmHg.  3. Right atrial size was mildly dilated.  4. The mitral valve is grossly normal. Trivial mitral valve regurgitation. No evidence of mitral stenosis.  5. The aortic valve is grossly normal. Aortic valve regurgitation is trivial. No aortic stenosis is present.  6. The inferior vena cava is normal in size with greater than 50% respiratory variability, suggesting right atrial pressure of 3 mmHg.  ASSESSMENT AND PLAN:  #) SSS s/p PPM Device functioning well, see paceart for details No changes made  #) Chronic diastolic HF Echo with normal EF Some bilateral dependent edema, managed with PRN lasix On GDMT, mgmt per gen cards  #) parox Afib Minimal AF by device No significant symptoms Intervals stable on EKG Continue flecainide + metoprolol; metop stopped in hospital, will resume now  #) Hypercoag d/t AFib CHA2DS2-VASc Score = 8 [CHF History: 1,  HTN History: 1, Diabetes History: 1, Stroke History: 2, Vascular Disease History: 1, Age Score: 2, Gender Score: 0].  Therefore, the patient's annual risk of stroke is 10.8 % OAC - xarelto '20mg'$  daily, dosed appropriately for CrCl 68.      #) HTN Well-controlled today   Current medicines are reviewed at length with the patient today.   The patient does not have concerns regarding his medicines.  The following changes were made today:   START toprol XL '25mg'$  nightly  Labs/ tests ordered today include:  Orders Placed This Encounter  Procedures   EKG 12-Lead     Disposition: Follow up with Dr. Myles Gip in in 6 months to establish care with new MD   Signed, Mamie Levers, NP  12/11/22  12:09 PM  Electrophysiology [facility]

## 2022-12-11 ENCOUNTER — Ambulatory Visit: Payer: Medicare Other | Attending: Student | Admitting: Cardiology

## 2022-12-11 VITALS — BP 100/70 | HR 60 | Ht 69.0 in | Wt 200.8 lb

## 2022-12-11 DIAGNOSIS — Z950 Presence of cardiac pacemaker: Secondary | ICD-10-CM | POA: Diagnosis not present

## 2022-12-11 DIAGNOSIS — I495 Sick sinus syndrome: Secondary | ICD-10-CM | POA: Insufficient documentation

## 2022-12-11 DIAGNOSIS — I10 Essential (primary) hypertension: Secondary | ICD-10-CM | POA: Insufficient documentation

## 2022-12-11 DIAGNOSIS — I480 Paroxysmal atrial fibrillation: Secondary | ICD-10-CM | POA: Insufficient documentation

## 2022-12-11 DIAGNOSIS — I5032 Chronic diastolic (congestive) heart failure: Secondary | ICD-10-CM | POA: Insufficient documentation

## 2022-12-11 LAB — CUP PACEART INCLINIC DEVICE CHECK
Date Time Interrogation Session: 20240126124131
Implantable Lead Connection Status: 753985
Implantable Lead Connection Status: 753985
Implantable Lead Implant Date: 20100719
Implantable Lead Implant Date: 20211130
Implantable Lead Location: 753859
Implantable Lead Location: 753860
Implantable Lead Model: 4136
Implantable Lead Model: 7842
Implantable Lead Serial Number: 1055910
Implantable Lead Serial Number: 28594836
Implantable Pulse Generator Implant Date: 20211130
Lead Channel Impedance Value: 625 Ohm
Lead Channel Impedance Value: 688 Ohm
Lead Channel Pacing Threshold Amplitude: 1 V
Lead Channel Pacing Threshold Amplitude: 1.1 V
Lead Channel Pacing Threshold Pulse Width: 0.4 ms
Lead Channel Pacing Threshold Pulse Width: 1 ms
Lead Channel Sensing Intrinsic Amplitude: 3.9 mV
Lead Channel Sensing Intrinsic Amplitude: 6.7 mV
Lead Channel Setting Pacing Amplitude: 2 V
Lead Channel Setting Pacing Amplitude: 2.5 V
Lead Channel Setting Pacing Pulse Width: 1 ms
Lead Channel Setting Sensing Sensitivity: 2.5 mV
Pulse Gen Serial Number: 951809
Zone Setting Status: 755011

## 2022-12-11 MED ORDER — METOPROLOL SUCCINATE ER 25 MG PO TB24: 25.0000 mg | ORAL_TABLET | Freq: Every evening | ORAL | 2 refills | Status: DC

## 2022-12-11 NOTE — Patient Instructions (Signed)
Medication Instructions:   Your physician recommends that you continue on your current medications as directed. Please refer to the Current Medication list given to you today.   *If you need a refill on your cardiac medications before your next appointment, please call your pharmacy*   Lab Work:  None ordered.  If you have labs (blood work) drawn today and your tests are completely normal, you will receive your results only by: [place] (if you have MyChart) OR A paper copy in the mail If you have any lab test that is abnormal or we need to change your treatment, we will call you to review the results.   Testing/Procedures:  None ordered.   Follow-Up: At [facility], you and your health needs are our priority.  As part of our continuing mission to provide you with exceptional heart care, we have created designated Provider Care Teams.  These Care Teams include your primary Cardiologist (physician) and Advanced Practice Providers (APPs -  Physician Assistants and Nurse Practitioners) who all work together to provide you with the care you need, when you need it.  We recommend signing up for the patient portal called "MyChart".  Sign up information is provided on this After Visit Summary.  MyChart is used to connect with patients for Virtual Visits (Telemedicine).  Patients are able to view lab/test results, encounter notes, upcoming appointments, etc.  Non-urgent messages can be sent to your provider as well.   To learn more about what you can do with MyChart, go to [email or web address].    Your next appointment:   6 month(s)  Provider:   Doralee Albino, MD

## 2022-12-14 ENCOUNTER — Ambulatory Visit: Payer: Medicare Other | Admitting: Internal Medicine

## 2022-12-14 ENCOUNTER — Other Ambulatory Visit: Payer: Self-pay | Admitting: Student

## 2022-12-23 ENCOUNTER — Telehealth: Payer: Self-pay | Admitting: Internal Medicine

## 2022-12-23 NOTE — Telephone Encounter (Signed)
Pt has f/u appt with PCP 2/21 and has scheduled a lab appt 2/15.  Please enter lab orders.

## 2022-12-23 NOTE — Telephone Encounter (Signed)
Sorry, unable as pt has traditional medicare as primary insurance, and this is not allowed as we are told medicare will not pay

## 2022-12-25 NOTE — Telephone Encounter (Signed)
Per note from PCP, lab visit appt cannot be done. Please ctc patient to inform him.

## 2022-12-28 NOTE — Telephone Encounter (Signed)
Patient is aware and will cancel lab appt

## 2022-12-29 ENCOUNTER — Encounter: Payer: Self-pay | Admitting: Gastroenterology

## 2022-12-30 ENCOUNTER — Ambulatory Visit: Payer: Medicare Other | Attending: Physician Assistant | Admitting: Physician Assistant

## 2022-12-30 ENCOUNTER — Encounter: Payer: Self-pay | Admitting: Physician Assistant

## 2022-12-30 VITALS — BP 94/64 | HR 60 | Ht 69.0 in | Wt 209.4 lb

## 2022-12-30 DIAGNOSIS — I10 Essential (primary) hypertension: Secondary | ICD-10-CM | POA: Diagnosis not present

## 2022-12-30 DIAGNOSIS — I495 Sick sinus syndrome: Secondary | ICD-10-CM | POA: Diagnosis not present

## 2022-12-30 DIAGNOSIS — J449 Chronic obstructive pulmonary disease, unspecified: Secondary | ICD-10-CM | POA: Diagnosis not present

## 2022-12-30 DIAGNOSIS — E785 Hyperlipidemia, unspecified: Secondary | ICD-10-CM | POA: Insufficient documentation

## 2022-12-30 DIAGNOSIS — I6529 Occlusion and stenosis of unspecified carotid artery: Secondary | ICD-10-CM | POA: Insufficient documentation

## 2022-12-30 DIAGNOSIS — I700 Atherosclerosis of aorta: Secondary | ICD-10-CM

## 2022-12-30 DIAGNOSIS — I480 Paroxysmal atrial fibrillation: Secondary | ICD-10-CM | POA: Diagnosis not present

## 2022-12-30 DIAGNOSIS — I5032 Chronic diastolic (congestive) heart failure: Secondary | ICD-10-CM | POA: Insufficient documentation

## 2022-12-30 NOTE — Patient Instructions (Signed)
Medication Instructions:  1.Stop amlodipine  *If you need a refill on your cardiac medications before your next appointment, please call your pharmacy*   Lab Work: BMP, BNP and CBC today If you have labs (blood work) drawn today and your tests are completely normal, you will receive your results only by: [place] (if you have MyChart) OR A paper copy in the mail If you have any lab test that is abnormal or we need to change your treatment, we will call you to review the results.   Follow-Up: At [facility], you and your health needs are our priority.  As part of our continuing mission to provide you with exceptional heart care, we have created designated Provider Care Teams.  These Care Teams include your primary Cardiologist (physician) and Advanced Practice Providers (APPs -  Physician Assistants and Nurse Practitioners) who all work together to provide you with the care you need, when you need it.   Your next appointment:   Keep appointments as scheduled  Other Instructions Check your blood pressure daily, one hour after taking your morning medications for 2 weeks, keep a log and send [place] the readings through mychart at the end of the 2 weeks

## 2022-12-30 NOTE — Progress Notes (Signed)
Office Visit    Patient Name: [name] Date of Encounter: 12/30/2022  PCP:  Biagio Borg, MD   [place]  Cardiologist:  Early Osmond, MD  Advanced Practice Provider:  No care team member to display Electrophysiologist:  Melida Quitter, MD    HPI    [name] is a 82 y.o. male with a past medical history of chronic diastolic CHF, paroxysmal atrial fibrillation, sick sinus syndrome, hypertension, aortic atherosclerosis, CAD, CVA,   He was last seen by Dr. Ali Lowe in 07/2022 and at that time he was having some shortness of breath likely from an elevated left hemidiaphragm and low FEV1.  He was euvolemic on exam.  No issues with Xarelto.  Encouraged to continue his current medications.  Coronary calcifications with CT FFR negative in 2022.  Today, he states that he feels his elevated hemidiaphragm has become worse and is leading to more SOB. This occurred when he was having his PPM placed. He is currently on lasix for LE edema but it was not on his list today. We will add PRN lasix. We discontinued his Norvasc since it can cause increased edema. It will also help with his hypotension today (94/64). He is encouraged to keep track of his BP at home. We may need to further reduce his medications if it remains low. He states his only residual from his CVA is mild left sided weakness.   Reports no chest pain, pressure, or tightness. No orthopnea, PND. Reports no palpitations.    Past Medical History    Past Medical History:  Diagnosis Date   Abdominal pain, epigastric    Allergic rhinitis, cause unspecified    Arthritis    BPH (benign prostatic hyperplasia)    CHF (congestive heart failure) (HCC)    Chronic anticoagulation    xarelto   Cognitive communication deficit 09/29/2022   Coronary artery calcification seen on CAT scan 12/26/2012   Depressive disorder, not elsewhere classified    Diabetes mellitus treated with oral medication ([place])  09/28/2022   Diverticulosis of colon (without mention of hemorrhage)    Esophageal reflux    High risk medication use    Hyperlipidemia    Hyperplastic rectal polyp    Hypertension    Lumbago    Pacemaker    Paralyzed hemidiaphragm    LEFT   Persistent atrial fibrillation ([place])    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 ([place]) 09/29/2022   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 especially given the patient's history of COPD and 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   Sick sinus syndrome ([place]) 05/16/2009   s/p PPM ([employer]) by [name]   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 ([place]) 09/28/2022   Past Surgical History:  Procedure Laterality Date   CYSTOSCOPY WITH RETROGRADE PYELOGRAM, URETEROSCOPY AND STENT PLACEMENT Left 12/30/2021   Procedure: CYSTOSCOPY WITH RETROGRADE PYELOGRAM, URETEROSCOPY;  Surgeon: Remi Haggard, MD;  Location: [facility];  Service: Urology;  Laterality: Left;   ENDARTERECTOMY Right 10/01/2022   Procedure: ENDARTERECTOMY CAROTID;  Surgeon: Cherre Robins, MD;  Location: [facility] OR;  Service: Vascular;  Laterality: Right;   ESOPHAGOGASTRODUODENOSCOPY N/A 12/05/2013   Procedure: ESOPHAGOGASTRODUODENOSCOPY (EGD);  Surgeon: [name], MD;  [facility];  Service: Endoscopy;  Laterality: N/A;   ESOPHAGOGASTRODUODENOSCOPY N/A 07/04/2015   Procedure: ESOPHAGOGASTRODUODENOSCOPY (EGD);  Surgeon: [name], MD;  Location: [place];  Service: Endoscopy;  Laterality: N/A;   EXTRACORPOREAL SHOCK WAVE LITHOTRIPSY Left 10/13/2021   Procedure: EXTRACORPOREAL SHOCK WAVE LITHOTRIPSY (ESWL);  Surgeon: Lucas Mallow, MD;  Location: [facility];  Service: Urology;  Laterality: Left;  90 MINS   INSERT / REPLACE / REMOVE PACEMAKER  05/2009    [employer] [id number]  (serial number [id number]) dual-chamber pacemaker   LEAD REVISION/REPAIR N/A 10/15/2020   Procedure: LEAD  REVISION/REPAIR;  Surgeon: Thompson Grayer, MD;  Location: [place] CV LAB;  Service: Cardiovascular;  Laterality: N/A;   PACEMAKER PLACEMENT Bilateral    '05   PATCH ANGIOPLASTY Right 10/01/2022   Procedure: RIGHT CAROTID ARTERY PATCH ANGIOPLASTY WITH 1CM x 6CM XENOSURE BIOLOGIC PATCH;  Surgeon: Cherre Robins, MD;  Location: [place];  Service: Vascular;  Laterality: Right;   PPM GENERATOR CHANGEOUT N/A 10/15/2020   Procedure: PPM GENERATOR CHANGEOUT;  Surgeon: Thompson Grayer, MD;  Location: [place] CV LAB;  Service: Cardiovascular;  Laterality: N/A;   RIGHT HEART CATH N/A 09/09/2022   Procedure: RIGHT HEART CATH;  Surgeon: Early Osmond, MD;  Location: [place] CV LAB;  Service: Cardiovascular;  Laterality: N/A;    Allergies  No Known Allergies   EKGs/Labs/Other Studies Reviewed:   The following studies were reviewed today:  Cardiac cath 09/09/22  .  Mild pulmonary hypertension with a mean PA pressure of 28 mmHg. 2.  Mean RA pressure of 8 mmHg, RV pressure of 43/6 with RV end-diastolic pressure of 12 mmHg, mean wedge pressure of 7 mmHg, and PA pressure 45/24 with a mean of 28 mmHg. 3.  Fick cardiac output of 4.28 L/min and Fick cardiac index of 2.05 L/min/m. 4.  PVR of 4.75 Woods units. 5.  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   Recommendation: Continue current therapy.    EKG:  EKG is not ordered today.    Recent Labs: 05/18/2022: B Natriuretic Peptide 11.9 06/01/2022: Pro B Natriuretic peptide (BNP) 19.0; TSH 1.29 09/26/2022: ALT 26 10/01/2022: Magnesium 1.9 10/02/2022: BUN 14; Creatinine, Ser 1.13; Hemoglobin 13.0; Platelets 129; Potassium 4.0; Sodium 141  Recent Lipid Panel    Component  Value Date/Time   CHOL 116 10/02/2022 0420   CHOL 127 08/12/2022 1558   TRIG 45 10/02/2022 0420   TRIG 31 10/25/2006 0935   HDL 52 10/02/2022 0420   HDL 59 08/12/2022 1558   CHOLHDL 2.2 10/02/2022 0420   VLDL 9 10/02/2022 0420   LDLCALC 55 10/02/2022 0420   LDLCALC 52 08/12/2022 1558   LDLDIRECT 183.4 12/26/2013 0911    Risk Assessment/Calculations:   CHA2DS2-VASc Score = 8   This indicates a 10.8% annual risk of stroke. The patient's score is based upon: CHF History: 1 HTN History: 1 Diabetes History: 1 Stroke History: 2 Vascular Disease History: 1 Age Score: 2 Gender Score: 0     Home Medications   Current Meds  Medication Sig   acetaminophen (TYLENOL) 650 MG CR tablet Take 1,300 mg by mouth in the morning and at bedtime.   amLODipine (NORVASC) 5 MG tablet Take 1 tablet (5 mg total) by mouth daily. Resume home dose from 10/06/22   atorvastatin (LIPITOR) 80 MG tablet TAKE 1 TABLET BY MOUTH EVERY DAY   Budeson-Glycopyrrol-Formoterol (BREZTRI AEROSPHERE) 160-9-4.8  MCG/ACT AERO 1 puffs twice per day   Cholecalciferol (VITAMIN D3) 50 MCG (2000 UT) TABS Take 2,000 Units by mouth daily.   dorzolamide-timolol (COSOPT) 22.3-6.8 MG/ML ophthalmic solution Place 1 drop into both eyes in the morning and at bedtime.   ezetimibe (ZETIA) 10 MG tablet Take 1 tablet (10 mg total) by mouth daily.   fexofenadine (ALLEGRA) 180 MG tablet TAKE 1 TABLET (180 MG TOTAL) BY MOUTH DAILY.   flecainide (TAMBOCOR) 100 MG tablet TAKE 1 TABLET BY MOUTH TWICE A DAY   furosemide (LASIX) 40 MG tablet Take 40 mg by mouth as needed.   glipiZIDE (GLUCOTROL XL) 2.5 MG 24 hr tablet Take 1 tablet (2.5 mg total) by mouth daily with breakfast.   latanoprost (XALATAN) 0.005 % ophthalmic solution Place 1 drop into both eyes at bedtime.    meclizine (ANTIVERT) 12.5 MG tablet TAKE 1 TABLET BY MOUTH EVERY 8 HOURS AS NEEDED   metoprolol succinate (TOPROL XL) 25 MG 24 hr tablet Take 1 tablet (25 mg total) by mouth at  bedtime.   nystatin (MYCOSTATIN) 100000 UNIT/ML suspension Take by mouth.   omeprazole (PRILOSEC) 40 MG capsule TAKE 1 CAPSULE BY MOUTH EVERY DAY AS NEEDED   psyllium (METAMUCIL) 0.52 g capsule Take 2 capsules by mouth daily.   tamsulosin (FLOMAX) 0.4 MG CAPS capsule TAKE 1 CAPSULE BY MOUTH TWICE A DAY   telmisartan (MICARDIS) 20 MG tablet Take 1 tablet (20 mg total) by mouth at bedtime. Resume from 10/06/22   traMADol (ULTRAM) 50 MG tablet TAKE 1 TABLET BY MOUTH EVERY 6 HOURS AS NEEDED FOR PAIN   XARELTO 20 MG TABS tablet TAKE 1 TABLET BY MOUTH DAILY WITH SUPPER     Review of Systems      All other systems reviewed and are otherwise negative except as noted above.  Physical Exam    VS:  BP 94/64   Pulse 60   Ht 5' 9"$  (1.753 m)   Wt 209 lb 6.4 oz (95 kg)   SpO2 95%   BMI 30.92 kg/m  , BMI Body mass index is 30.92 kg/m.  Wt Readings from Last 3 Encounters:  12/30/22 209 lb 6.4 oz (95 kg)  12/11/22 200 lb 12.8 oz (91.1 kg)  11/25/22 201 lb (91.2 kg)     GEN: Well nourished, well developed, in no acute distress. HEENT: normal. Neck: Supple, no JVD, carotid bruits, or masses. Cardiac: RRR, no murmurs, rubs, or gallops. No clubbing, cyanosis, R > L 1+ pitting pedal edema.  Radials/PT 2+ and equal bilaterally.  Respiratory:  Respirations regular and unlabored, clear to auscultation bilaterally. GI: Soft, nontender, nondistended. MS: No deformity or atrophy. Skin: Warm and dry, no rash. Neuro:  Strength and sensation are intact. Psych: Normal affect.  Assessment & Plan    DOE/SOB -thought to be due to hemidiaphragm, pulmonary rehab -he does have a spirometer at home and sees a pulmonologist -PRN lasix for volume overload -encouraged daily weights -BMP, BNP, CBC today  Hypotension -stop Norvasc -Lasix PRN for fluid overload -May need to stop Micardis if remains hypotensive  -asked to track his BP at home  -some dizziness at times  Chronic diastolic CHF -1+ LE edema  R > L  -as needed with the lasix  -compression encouraged  -BMP, BNP, CBC today  Paroxysmal atrial fibrillation/Sick sinus rhythm -NSR today -s/p PPM -continue current medications   Aortic atherosclerosis/CAD -No chest pain -continue current medications  Hyperlipidemia LDL goal less than 70 -last LDL 55 -  continue Lipitor and zetia  Stage III chronic kidney disease -stable, creatinine 1.13  Chronic obstructive pulmonary disease/mild pulmonary HTN -he will need to follow-up with pulmonary since he continues to have SOB -continue current inhaled medications         Disposition: Follow up 6 months with Early Osmond, MD or APP.  Signed, Elgie Collard, PA-C 12/30/2022, 3:01 PM [facility]

## 2022-12-31 ENCOUNTER — Other Ambulatory Visit: Payer: Medicare Other

## 2022-12-31 ENCOUNTER — Telehealth (HOSPITAL_COMMUNITY): Payer: Self-pay

## 2022-12-31 LAB — CBC
Hematocrit: 42.9 % (ref 37.5–51.0)
Hemoglobin: 14.4 g/dL (ref 13.0–17.7)
MCH: 31 pg (ref 26.6–33.0)
MCHC: 33.6 g/dL (ref 31.5–35.7)
MCV: 92 fL (ref 79–97)
Platelets: 159 10*3/uL (ref 150–450)
RBC: 4.65 x10E6/uL (ref 4.14–5.80)
RDW: 15 % (ref 11.6–15.4)
WBC: 7.5 10*3/uL (ref 3.4–10.8)

## 2022-12-31 LAB — BASIC METABOLIC PANEL
BUN/Creatinine Ratio: 13 (ref 10–24)
BUN: 17 mg/dL (ref 8–27)
CO2: 21 mmol/L (ref 20–29)
Calcium: 9.2 mg/dL (ref 8.6–10.2)
Chloride: 107 mmol/L — ABNORMAL HIGH (ref 96–106)
Creatinine, Ser: 1.3 mg/dL — ABNORMAL HIGH (ref 0.76–1.27)
Glucose: 98 mg/dL (ref 70–99)
Potassium: 4.1 mmol/L (ref 3.5–5.2)
Sodium: 146 mmol/L — ABNORMAL HIGH (ref 134–144)
eGFR: 56 mL/min/{1.73_m2} — ABNORMAL LOW (ref >59)

## 2022-12-31 LAB — PRO B NATRIURETIC PEPTIDE: NT-Pro BNP: 91 pg/mL (ref 0–486)

## 2022-12-31 NOTE — Telephone Encounter (Signed)
Pt is not interested in the pulmonary rehab program. Closed referral.

## 2022-12-31 NOTE — Addendum Note (Signed)
Addended by: Elgie Collard on: 12/31/2022 01:33 PM   Modules accepted: Orders

## 2023-01-06 ENCOUNTER — Ambulatory Visit (INDEPENDENT_AMBULATORY_CARE_PROVIDER_SITE_OTHER): Payer: Medicare Other | Admitting: Internal Medicine

## 2023-01-06 VITALS — BP 130/76 | HR 60 | Temp 97.9°F | Ht 69.0 in | Wt 209.0 lb

## 2023-01-06 DIAGNOSIS — I10 Essential (primary) hypertension: Secondary | ICD-10-CM

## 2023-01-06 DIAGNOSIS — R8290 Unspecified abnormal findings in urine: Secondary | ICD-10-CM

## 2023-01-06 DIAGNOSIS — E559 Vitamin D deficiency, unspecified: Secondary | ICD-10-CM | POA: Diagnosis not present

## 2023-01-06 DIAGNOSIS — I6529 Occlusion and stenosis of unspecified carotid artery: Secondary | ICD-10-CM

## 2023-01-06 DIAGNOSIS — J329 Chronic sinusitis, unspecified: Secondary | ICD-10-CM

## 2023-01-06 LAB — URINALYSIS, ROUTINE W REFLEX MICROSCOPIC
Bilirubin Urine: NEGATIVE
Hgb urine dipstick: NEGATIVE
Ketones, ur: NEGATIVE
Leukocytes,Ua: NEGATIVE
Nitrite: NEGATIVE
RBC / HPF: NONE SEEN (ref 0–?)
Specific Gravity, Urine: 1.015 (ref 1.000–1.030)
Total Protein, Urine: NEGATIVE
Urine Glucose: NEGATIVE
Urobilinogen, UA: 0.2 (ref 0.0–1.0)
WBC, UA: NONE SEEN (ref 0–?)
pH: 7.5 (ref 5.0–8.0)

## 2023-01-06 NOTE — Progress Notes (Signed)
Patient ID: [name], male   DOB: 06-04-1941, 82 y.o.   MRN: [id number]        Chief Complaint: follow up chronic sinusitis, bad urine order, low vit d, htn       HPI:  [name] is a 82 y.o. male .here with persistent sinus pain mild intemittent for several months, with feeling warm at times, but no facial pain, pressure, headache, general weakness and malaise, and greenish d/c, with mild ST and cough, but pt denies chest pain, wheezing, increased sob or doe, orthopnea, PND, increased LE swelling, palpitations, dizziness or syncope.   Pt denies polydipsia, polyuria, or new focal neuro s/s.    Pt denies fever, wt loss, night sweats, loss of appetite, or other constitutional symptoms   Now off amlodipine 5 mg  due to variable bp's at home per cards feb 14.  Denies urinary symptoms such as dysuria, frequency, urgency, flank pain, hematuria or n/v, fever, chills, but has bad urine odor for several days.   Wt Readings from Last 3 Encounters:  01/12/23 209 lb (94.8 kg)  01/12/23 210 lb 8 oz (95.5 kg)  01/06/23 209 lb (94.8 kg)   BP Readings from Last 3 Encounters:  01/12/23 132/82  01/12/23 124/84  01/06/23 130/76         Past Medical History:  Diagnosis Date   Abdominal pain, epigastric    Allergic rhinitis, cause unspecified    Arthritis    BPH (benign prostatic hyperplasia)    CHF (congestive heart failure) (HCC)    Chronic anticoagulation    xarelto   Cognitive communication deficit 09/29/2022   Coronary artery calcification seen on CAT scan 12/26/2012   Depressive disorder, not elsewhere classified    Diabetes mellitus treated with oral medication ([place]) 09/28/2022   Diverticulosis of colon (without mention of hemorrhage)    Esophageal reflux    High risk medication use    Hyperlipidemia    Hyperplastic rectal polyp    Hypertension    Lumbago    Pacemaker    Paralyzed hemidiaphragm    LEFT   Persistent atrial fibrillation ([place])    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 ([place]) 09/28/2022   Past Surgical History:  Procedure Laterality Date   CYSTOSCOPY WITH RETROGRADE PYELOGRAM, URETEROSCOPY AND STENT PLACEMENT Left 12/30/2021   Procedure: CYSTOSCOPY WITH RETROGRADE PYELOGRAM, URETEROSCOPY;  Surgeon: Remi Haggard, MD;  [facility];  Service: Urology;  Laterality: Left;   ENDARTERECTOMY Right 10/01/2022   Procedure: ENDARTERECTOMY CAROTID;  Surgeon: Cherre Robins, MD;  Location: [name] OR;  Service: Vascular;  Laterality: Right;   ESOPHAGOGASTRODUODENOSCOPY N/A 12/05/2013   Procedure: ESOPHAGOGASTRODUODENOSCOPY (EGD);  Surgeon: [name], MD;  [facility];  Service: Endoscopy;  Laterality: N/A;   ESOPHAGOGASTRODUODENOSCOPY N/A 07/04/2015   Procedure: ESOPHAGOGASTRODUODENOSCOPY (EGD);  Surgeon: [name], MD;  Location: [place];  Service: Endoscopy;  Laterality: N/A;   EXTRACORPOREAL SHOCK WAVE LITHOTRIPSY Left 10/13/2021   Procedure: EXTRACORPOREAL SHOCK WAVE LITHOTRIPSY (ESWL);  Surgeon: Lucas Mallow, MD;  Location: [facility];  Service: Urology;  Laterality: Left;  90 MINS   INSERT / REPLACE / REMOVE  PACEMAKER  05/2009    [employer] [id number]  (serial number [id number]) dual-chamber pacemaker   LEAD REVISION/REPAIR N/A 10/15/2020   Procedure: LEAD REVISION/REPAIR;  Surgeon: Thompson Grayer, MD;  Location: [place] CV LAB;  Service: Cardiovascular;  Laterality: N/A;   PACEMAKER PLACEMENT Bilateral    '05   PATCH ANGIOPLASTY Right 10/01/2022   Procedure: RIGHT CAROTID ARTERY PATCH ANGIOPLASTY WITH 1CM x 6CM XENOSURE BIOLOGIC PATCH;  Surgeon: Cherre Robins, MD;  Location: [place];   Service: Vascular;  Laterality: Right;   PPM GENERATOR CHANGEOUT N/A 10/15/2020   Procedure: PPM GENERATOR CHANGEOUT;  Surgeon: Thompson Grayer, MD;  Location: [place] [facility];  Service: Cardiovascular;  Laterality: N/A;   RIGHT HEART CATH N/A 09/09/2022   Procedure: RIGHT HEART CATH;  Surgeon: Early Osmond, MD;  Location: [place] [facility];  Service: Cardiovascular;  Laterality: N/A;    reports that he quit smoking about 31 years ago. His smoking use included cigarettes. He has a 10.00 pack-year smoking history. He has quit using smokeless tobacco.  His smokeless tobacco use included chew. He reports that he does not drink alcohol and does not use drugs. family history includes Arthritis in his paternal grandmother; Asthma in his sister; Cancer in his father; Diabetes in his mother; Hypertension in his brother, daughter, father, mother, sister, and son; Lung cancer in his father. No Known Allergies Current Outpatient Medications on File Prior to Visit  Medication Sig Dispense Refill   acetaminophen (TYLENOL) 650 MG CR tablet Take 1,300 mg by mouth in the morning and at bedtime.     Budeson-Glycopyrrol-Formoterol (BREZTRI AEROSPHERE) 160-9-4.8 MCG/ACT AERO 1 puffs twice per day 10.7 g 11   Cholecalciferol (VITAMIN D3) 50 MCG (2000 UT) TABS Take 2,000 Units by mouth daily.     dorzolamide-timolol (COSOPT) 22.3-6.8 MG/ML ophthalmic solution Place 1 drop into both eyes in the morning and at bedtime.     ezetimibe (ZETIA) 10 MG tablet Take 1 tablet (10 mg total) by mouth daily. 90 tablet 3   fexofenadine (ALLEGRA) 180 MG tablet TAKE 1 TABLET (180 MG TOTAL) BY MOUTH DAILY. 90 tablet 1   flecainide (TAMBOCOR) 100 MG tablet TAKE 1 TABLET BY MOUTH TWICE A DAY 180 tablet 3   furosemide (LASIX) 40 MG tablet Take 40 mg by mouth as needed.     glipiZIDE (GLUCOTROL XL) 2.5 MG 24 hr tablet Take 1 tablet (2.5 mg total) by mouth daily with breakfast. 90 tablet 3   latanoprost (XALATAN) 0.005 % ophthalmic  solution Place 1 drop into both eyes at bedtime.      meclizine (ANTIVERT) 12.5 MG tablet TAKE 1 TABLET BY MOUTH EVERY 8 HOURS AS NEEDED 30 tablet 5   metoprolol succinate (TOPROL XL) 25 MG 24 hr tablet Take 1 tablet (25 mg total) by mouth at bedtime. 90 tablet 2   nystatin (MYCOSTATIN) 100000 UNIT/ML suspension Take by mouth.     omeprazole (PRILOSEC) 40 MG capsule TAKE 1 CAPSULE BY MOUTH EVERY DAY AS NEEDED 90 capsule 2   psyllium (METAMUCIL) 0.52 g capsule Take 2 capsules by mouth daily.     tamsulosin (FLOMAX) 0.4 MG CAPS capsule TAKE 1 CAPSULE BY MOUTH TWICE A DAY 180 capsule 3   telmisartan (MICARDIS) 20 MG tablet Take 1 tablet (20 mg total) by mouth at bedtime. Resume from 10/06/22     traMADol (ULTRAM) 50 MG tablet TAKE 1 TABLET BY MOUTH EVERY 6 HOURS AS NEEDED FOR PAIN 120 tablet 2  XARELTO 20 MG TABS tablet TAKE 1 TABLET BY MOUTH DAILY WITH SUPPER 90 tablet 3   No current facility-administered medications on file prior to visit.        ROS:  All others reviewed and negative.  Objective        PE:  BP 130/76 (BP Location: Right Arm, Patient Position: Sitting, Cuff Size: Large)   Pulse 60   Temp 97.9 F (36.6 C) (Oral)   Ht '5\' 9"'$  (1.753 m)   Wt 209 lb (94.8 kg)   SpO2 93%   BMI 30.86 kg/m                 Constitutional: Pt appears in NAD               HENT: Head: NCAT.                Right Ear: External ear normal.                 Left Ear: External ear normal.                Eyes: . Pupils are equal, round, and reactive to light. Conjunctivae and EOM are normal               Nose: without d/c or deformity               Neck: Neck supple. Gross normal ROM               Cardiovascular: Normal rate and regular rhythm.                 Pulmonary/Chest: Effort normal and breath sounds without rales or wheezing.                Abd:  Soft, NT, ND, + BS, no organomegaly               Neurological: Pt is alert. At baseline orientation, motor grossly intact               Skin:  Skin is warm. No rashes, no other new lesions, LE edema - none               Psychiatric: Pt behavior is normal without agitation   Micro: none  Cardiac tracings I have personally interpreted today:  none  Pertinent Radiological findings (summarize): none   Lab Results  Component Value Date   WBC 7.5 12/30/2022   HGB 14.4 12/30/2022   HCT 42.9 12/30/2022   PLT 159 12/30/2022   GLUCOSE 98 12/30/2022   CHOL 116 10/02/2022   TRIG 45 10/02/2022   HDL 52 10/02/2022   LDLDIRECT 183.4 12/26/2013   LDLCALC 55 10/02/2022   ALT 26 09/26/2022   AST 20 09/26/2022   NA 146 (H) 12/30/2022   K 4.1 12/30/2022   CL 107 (H) 12/30/2022   CREATININE 1.30 (H) 12/30/2022   BUN 17 12/30/2022   CO2 21 12/30/2022   TSH 1.29 06/01/2022   PSA 2.01 11/24/2021   INR 2.8 (H) 09/26/2022   HGBA1C 6.1 (H) 09/28/2022   Assessment/Plan:  [name] is a 82 y.o. Black or African American [2] male with  has a past medical history of Abdominal pain, epigastric, Allergic rhinitis, cause unspecified, Arthritis, BPH (benign prostatic hyperplasia), CHF (congestive heart failure) ([place]), Chronic anticoagulation, Cognitive communication deficit (09/29/2022), Coronary artery calcification seen on CAT scan (12/26/2012), Depressive disorder, not elsewhere classified, Diabetes mellitus treated with oral  medication ([place]) (09/28/2022), Diverticulosis of colon (without mention of hemorrhage), Esophageal reflux, High risk medication use, Hyperlipidemia, Hyperplastic rectal polyp, Hypertension, Lumbago, Pacemaker, Paralyzed hemidiaphragm, Persistent atrial fibrillation ([place]), 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 ([place]) (09/29/2022), Sick sinus syndrome ([place]) (05/16/2009), and 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 ([place]) (09/28/2022).  Essential hypertension BP Readings from Last 3 Encounters:  01/12/23 132/82  01/12/23 124/84  01/06/23 130/76   Stable, pt to continue medical treatment toprol xl 25 mg qd, micardis 20  mg qd   Vitamin D deficiency Last vitamin D Lab Results  Component Value Date   VD25OH 34.45 11/24/2021   Low, to start oral replacement   Bad odor of urine Exam benign, for ua and culture  Chronic sinusitis At least mild to mod persistent, for ENT referral  Followup: Return in about 6 months (around 07/07/2023).  Cathlean Cower, MD 01/12/2023 7:52 PM [facility]

## 2023-01-06 NOTE — Patient Instructions (Addendum)
Please remember to see Pulm in mar 2024, and call for your yearly eye exam  Please continue all other medications as before, and refills have been done if requested.  Please have the pharmacy call with any other refills you may need.  Please continue your efforts at being more active, low cholesterol diet, and weight control.  You are otherwise up to date with prevention measures today.  Please keep your appointments with your specialists as you may have planned - cardiology  You will be contacted regarding the referral for: ENT  Please go to the LAB at the blood drawing area for the tests to be done - just the urine testing today  You will be contacted by phone if any changes need to be made immediately.  Otherwise, you will receive a letter about your results with an explanation, but please check with MyChart first.  Please remember to sign up for MyChart if you have not done so, as this will be important to you in the future with finding out test results, communicating by private email, and scheduling acute appointments online when needed.  Please make an Appointment to return in 6 months, or sooner if needed

## 2023-01-07 LAB — URINE CULTURE: Result:: NO GROWTH

## 2023-01-08 NOTE — Progress Notes (Unsigned)
NEUROLOGY FOLLOW UP OFFICE NOTE  KYSEAN WITMER TD:[id number]  Subjective:  [name] is a 82 y.o. 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  To briefly review: Patient was seen initially on 07/15/22 for dizziness.  Patient describes dizziness that feels like things are spinning around him. It has been present for 2-3 years (~2020). It occurs when he turns his head quickly or when he is in bed when he turns over. The sensation passes if he is still. Patient started on meclizine for dizziness, which has improved things. He last had an episode of dizziness a few months ago. He previously tried to wean his meclizine from 2 times daily to once daily, but the sensations came back.   He also mentions that when he first stands up, he is off balance. If he stands up too fast, he feels like he is going to "fall out." If he gives himself a minute, he feels better. He had a near fall in the last month, but denies syncope.   Patient denies impaired sweating, excessive mucosal dryness, early satiety, constipation (on metamucil for years). He endorses some postprandial abdominal bloating and urinary frequency due to water pills.   There are no complaints relating to other symptoms of small fiber modalities including paresthesia/pain.   The patient has not noticed any recent skin rashes nor does he report any constitutional symptoms like fever, night sweats, anorexia or unintentional weight loss.   EtOH use: None  Restrictive diet? No Family history of neuropathy/myopathy/NM disease? no   10/14/22: Labs sent after last clinic visit were normal. Patient was doing well until 09/26/22. He woke up that morning with left arm weakness and numbness. Patient was laying on the  left arm when he woke up and felt like it was asleep. Later on that day, his arm with feeling better. There were no symptoms in the left leg. He denies face droop, difficulty speaking, or voice changes. He presented to the ED that day. CT head on 09/26/22 showed no acute process. MRI brain was recommended but patient's PM was incompatible. Repeat CTH on 09/27/22 showed loss of gray/white differentiation in right posterior frontal lobe suspicious for infarct. CTA of head and neck showed stenosis of RICA origin in the neck without large vessel occlusion. Patient underwent successful R CEA on 10/01/22 due to concerns of symptomatic carotid.   Other stroke work up, normal EF on echo, LDL 55, HbA1c 6.1.   Patient was discharged on 10/02/22 on Xarelto 20 mg, ASA 81 mg, atorva 40 mg, and zetia 10 mg daily.   Currently, patient has low back pain. He does not feel numb in the left arm, but does have some difficulty picking things up. He has difficulty getting his jacket.    Patient denies missing any doses of Xarelto prior to his symptoms on 09/26/22.    Regarding his dizziness, patient has vertigo particularly when tilting his head backward or getting up really quickly.   Patient was to go to PT after discharge, but has not heard about scheduling.  Most recent Assessment and Plan (10/14/22): This is [name], a 82 y.o. male with: Acute onset left sided weakness and numbness - CTH suspicious for infarct in right frontal lobe. CTA with stenosis of right  ICA, s/p right CEA on 10/01/22. Patient's numbness has resolved. He still has weakness, which per my exam is similar to prior, but patient does feel he is weaker today than last visit. Vertigo - likely BPPV given that it occurs with head position changes Orthostatic hypotension - lightheaded on standing quickly    Plan: --Follow up with vascular surgery as planned on 10/27/22 (Dr. Stanford Breed) -Continue medications for now. -Continue Xarelto 20 mg daily.  No clear indication of Xarelto failure to suggest that he should switch to eliquis at this time. -Continue ASA - will message Dr. Stanford Breed if this needs to be continued from carotid perspective -Physical Therapy for LUE weakness -Continue meclizine as needed -Continue conservative management as previously discussed for vertigo (Epley) -Continue conservative management for orthostatic hypotension as discussed at last visit   10/15/22 phone note: I spoke with Dr. Stanford Breed from vascular surgery who did patient's CEA regarding Xarelto and aspirin therapy. We are in agreement that patient does not need to continue aspirin.   I called patient and shared this information with him. He will stop asa 81 mg daily and continue his other medications, including Xarelto, as prescribed.  Since their last visit: Patient mentions having increased shortness of breath, fatigue. He has a new back pain on right side and some odd odor in his urine. UA from 01/06/23 (due to this symptom) was normal.  Patient is doing well regarding his left sided weakness. He completed therapy and has home exercises to do. He stopped aspirin as requested.  Patient has no vertigo since last visit, but takes meclizine twice daily "to prevent" dizziness. He still gets lightheaded when standing on occasion.   MEDICATIONS:  Outpatient Encounter Medications as of 01/14/2023  Medication Sig   acetaminophen (TYLENOL) 650 MG CR tablet Take 1,300 mg by mouth in the morning and at bedtime.   albuterol (VENTOLIN HFA) 108 (90 Base) MCG/ACT inhaler Inhale 2 puffs into the lungs every 6 (six) hours as needed for wheezing or shortness of breath.   atorvastatin (LIPITOR) 80 MG tablet TAKE 1 TABLET BY MOUTH EVERY DAY   Budeson-Glycopyrrol-Formoterol (BREZTRI AEROSPHERE) 160-9-4.8 MCG/ACT AERO 1 puffs twice per day   Cholecalciferol (VITAMIN D3) 50 MCG (2000 UT) TABS Take 2,000 Units by mouth daily.   dorzolamide-timolol (COSOPT) 22.3-6.8 MG/ML  ophthalmic solution Place 1 drop into [name] eyes in the morning and at bedtime.   ezetimibe (ZETIA) 10 MG tablet Take 1 tablet (10 mg total) by mouth daily.   fexofenadine (ALLEGRA) 180 MG tablet TAKE 1 TABLET (180 MG TOTAL) BY MOUTH DAILY.   flecainide (TAMBOCOR) 100 MG tablet TAKE 1 TABLET BY MOUTH TWICE A DAY   furosemide (LASIX) 40 MG tablet Take 40 mg by mouth as needed.   glipiZIDE (GLUCOTROL XL) 2.5 MG 24 hr tablet Take 1 tablet (2.5 mg total) by mouth daily with breakfast.   latanoprost (XALATAN) 0.005 % ophthalmic solution Place 1 drop into [name] eyes at bedtime.    meclizine (ANTIVERT) 12.5 MG tablet TAKE 1 TABLET BY MOUTH EVERY 8 HOURS AS NEEDED   nystatin (MYCOSTATIN) 100000 UNIT/ML suspension Take by mouth.   omeprazole (PRILOSEC) 40 MG capsule TAKE 1 CAPSULE BY MOUTH EVERY DAY AS NEEDED   psyllium (METAMUCIL) 0.52 g capsule Take 2 capsules by mouth daily.   tamsulosin (FLOMAX) 0.4 MG CAPS capsule TAKE 1 CAPSULE BY MOUTH TWICE A DAY   telmisartan (MICARDIS) 20 MG tablet Take 1 tablet (20 mg total) by mouth at bedtime. Resume from  10/06/22   traMADol (ULTRAM) 50 MG tablet TAKE 1 TABLET BY MOUTH EVERY 6 HOURS AS NEEDED FOR PAIN   XARELTO 20 MG TABS tablet TAKE 1 TABLET BY MOUTH DAILY WITH SUPPER   metoprolol succinate (TOPROL XL) 25 MG 24 hr tablet Take 1 tablet (25 mg total) by mouth at bedtime. (Patient not taking: Reported on 01/14/2023)   [DISCONTINUED] atorvastatin (LIPITOR) 80 MG tablet TAKE 1 TABLET BY MOUTH EVERY DAY   No facility-administered encounter medications on file as of 01/14/2023.    PAST MEDICAL HISTORY: Past Medical History:  Diagnosis Date   Abdominal pain, epigastric    Allergic rhinitis, cause unspecified    Arthritis    BPH (benign prostatic hyperplasia)    CHF (congestive heart failure) (HCC)    Chronic anticoagulation    xarelto   Cognitive communication deficit 09/29/2022   Coronary artery calcification seen on CAT scan 12/26/2012   Depressive  disorder, not elsewhere classified    Diabetes mellitus treated with oral medication ([place]) 09/28/2022   Diverticulosis of colon (without mention of hemorrhage)    Esophageal reflux    High risk medication use    Hyperlipidemia    Hyperplastic rectal polyp    Hypertension    Lumbago    NAUSEA AND VOMITING 05/20/2010   Pacemaker    Paralyzed hemidiaphragm    LEFT   Persistent atrial fibrillation ([place])    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 ([place]) 09/29/2022   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 especially given the patient's history of COPD and 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   Sick sinus syndrome ([place]) 05/16/2009   s/p PPM ([employer]) by [facility]   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 ([place]) 09/28/2022    PAST SURGICAL HISTORY: Past Surgical History:  Procedure Laterality Date   CYSTOSCOPY WITH RETROGRADE PYELOGRAM, URETEROSCOPY AND STENT PLACEMENT Left 12/30/2021   Procedure: CYSTOSCOPY WITH RETROGRADE PYELOGRAM, URETEROSCOPY;  Surgeon: Remi Haggard, MD;  Location: [facility];  Service: Urology;  Laterality: Left;   ENDARTERECTOMY Right 10/01/2022   Procedure: ENDARTERECTOMY CAROTID;  Surgeon: Cherre Robins, MD;  Location: [facility];  Service: Vascular;  Laterality: Right;   ESOPHAGOGASTRODUODENOSCOPY N/A 12/05/2013   Procedure: ESOPHAGOGASTRODUODENOSCOPY (EGD);  Surgeon: [name], MD;  Location: [facility];  Service: Endoscopy;  Laterality: N/A;   ESOPHAGOGASTRODUODENOSCOPY N/A 07/04/2015   Procedure: ESOPHAGOGASTRODUODENOSCOPY (EGD);  Surgeon: [name], MD;  Location: [place];  Service: Endoscopy;  Laterality: N/A;   EXTRACORPOREAL SHOCK WAVE LITHOTRIPSY Left 10/13/2021   Procedure: EXTRACORPOREAL SHOCK WAVE LITHOTRIPSY (ESWL);  Surgeon: Lucas Mallow, MD;  Location: [facility];  Service: Urology;  Laterality: Left;  90 MINS   INSERT / REPLACE / REMOVE  PACEMAKER  05/2009    [employer] [id number]  (serial number [id number]) dual-chamber pacemaker   LEAD REVISION/REPAIR N/A 10/15/2020   Procedure: LEAD REVISION/REPAIR;  Surgeon: Thompson Grayer, MD;  Location: [place] [facility];  Service: Cardiovascular;  Laterality: N/A;   PACEMAKER PLACEMENT Bilateral    '05   PATCH ANGIOPLASTY Right 10/01/2022   Procedure: RIGHT CAROTID ARTERY PATCH ANGIOPLASTY WITH 1CM x 6CM XENOSURE BIOLOGIC PATCH;  Surgeon: Cherre Robins, MD;  Location: [place];  Service: Vascular;  Laterality: Right;   PPM GENERATOR CHANGEOUT N/A 10/15/2020   Procedure: PPM GENERATOR CHANGEOUT;  Surgeon: Thompson Grayer, MD;  Location: [place] [facility];  Service: Cardiovascular;  Laterality: N/A;  RIGHT HEART CATH N/A 09/09/2022   Procedure: RIGHT HEART CATH;  Surgeon: Early Osmond, MD;  Location: [place] [facility];  Service: Cardiovascular;  Laterality: N/A;    ALLERGIES: No Known Allergies  FAMILY HISTORY: Family History  Problem Relation Age of Onset   Diabetes Mother    Hypertension Mother    Hypertension Father    Lung cancer Father    Cancer Father    Asthma Sister    Hypertension Sister    Arthritis Paternal Grandmother    Hypertension Brother    Hypertension Daughter    Hypertension Son     SOCIAL HISTORY: Social History   Tobacco Use   Smoking status: Former    Packs/day: 0.50    Years: 20.00    Total pack years: 10.00    Types: Cigarettes    Quit date: 11/17/1991    Years since quitting: 31.1   Smokeless tobacco: Former    Types: Nurse, children's Use: Never used  Substance Use Topics   Alcohol use: No   Drug use: No   Social History   Social History Narrative   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    Caffeine 1-2 day cups   Right handed   Tri-level home      Do you live at home alone?with wife                 Objective:  Vital Signs:  BP 123/72   Pulse 60   Ht '5\' 9"'$  (1.753 m)   Wt 210 lb (95.3 kg)   SpO2 95%   BMI 31.01  kg/m   General: General appearance: Awake and alert. No distress. Cooperative with exam.  Skin: No obvious rash or jaundice. HEENT: Atraumatic. Anicteric. Lungs: Conversational dyspnea Heart: Regular Back: no paraspinal tenderness Psych: Affect appropriate.  Neurological: Mental Status: Alert. Speech fluent. No pseudobulbar affect Cranial Nerves: CNII: No RAPD. Visual fields intact. CNIII, IV, VI: PERRL. No nystagmus. EOMI. CN V: Facial sensation intact bilaterally to fine touch. CN VII: Facial muscles symmetric and strong. No ptosis at rest. CN VIII: Hears finger rub well bilaterally. CN IX: No hypophonia. CN X: Palate elevates symmetrically. CN XI: Full strength shoulder shrug bilaterally. CN XII: Tongue protrusion full and midline. No atrophy or fasciculations. No significant dysarthria Motor: Tone is normal.  Individual muscle group testing (MRC grade out of 5):  Movement     Neck flexion 5    Neck extension 5     Right Left   Shoulder abduction 5 4   Shoulder adduction 5 5   Shoulder ext rotation 5 4   Shoulder int rotation 5 5   Elbow flexion 5 5   Elbow extension 5 5   Finger extension 5 5   Finger flexion 5 5    Hip flexion 5 5   Hip extension 5 5   Hip adduction 5 5   Hip abduction 5 5   Knee extension 5 5   Knee flexion 5 5   Dorsiflexion 5 5   Plantarflexion 5 5    Reflexes:  Right Left   Bicep 1+ 1+   Tricep 1+ 1+   BrRad 1+ 1+   Knee 2+ 2+   Ankle 0 0    Sensation: Pinprick intact in all extremities Coordination: Intact finger-to- nose-finger bilaterally. Gait: Able to rise from chair with arms crossed unassisted. Normal, narrow-based gait.   Labs and Imaging review: New results: UA (01/06/23): unremarkable  Urine culture (01/06/23): no growth CBC (12/30/22): unremarkable CMP (12/30/22): significant for elevated Cr (1.30)  Previously reviewed results: IFE, SPEP (07/15/22): no M protein HbA1c (09/28/22): 6.1 Vit B12 (11/24/21): 389 TSH  (06/01/22): 1.29 ESR (11/04/21): 10  CT head wo contrast (09/26/22): FINDINGS: Brain: No evidence of acute infarction, hemorrhage, hydrocephalus, extra-axial collection or mass lesion/mass effect. Periventricular and deep white matter hypodensity.   Vascular: No hyperdense vessel or unexpected calcification.   Skull: Normal. Negative for fracture or focal lesion.   Sinuses/Orbits: No acute finding. Unchanged chronic fracture deformity of the medial inferior floor of the left orbit (series 4, image 56).   Other: Unchanged, very central appearing position of the dens with respect to the foramen magnum, which appears to deflect the lower brainstem and upper cervical spinal cord (series 5, image 31).   IMPRESSION: 1. No acute intracranial pathology. Small-vessel white matter disease. Consider MRI to more sensitively evaluate for acute diffusion restricting infarction if suspected. 2. Unchanged, very central appearing position of the dens with respect to the foramen magnum, which appears to deflect the lower brainstem and upper cervical spinal cord. This appearance is as on dedicated examination of the cervical spine dated 09/06/2019.   CT head, CTA head and neck (09/27/22): FINDINGS: CT HEAD FINDINGS   Brain: Hypoattenuation and loss of gray differentiation in the high right posterior frontal lobe, suspicious for acute infarct. No acute hemorrhage. Patchy white matter hypodensities, nonspecific but compatible with chronic microvascular ischemic disease. No evidence of mass lesion, midline shift, or hydrocephalus.   Vascular: Calcific atherosclerosis.   Skull: No acute fracture.   Sinuses/Orbits: No acute fracture.   Other: No mastoid effusions.   Review of the MIP images confirms the above findings   CTA NECK FINDINGS   Aortic arch: Great vessel origins are patent. Without significant stenosis.   Right carotid system: Atherosclerosis at the carotid bifurcation with  proximally 50% stenosis. Retropharyngeal course of the ICA.   Left carotid system: Atherosclerosis at the carotid bifurcation without greater than 50% stenosis. Retropharyngeal course of the ICA.   Vertebral arteries: Right dominant. The non dominant left vertebral artery is small throughout its course. [name] vertebral arteries are patent without significant stenosis.   Skeleton: Chronic widening of the atlantodental interval (up to 6 mm), suggestive of instability/laxity. Severe multilevel degenerative change.   Other neck: No acute findings.   Upper chest: No acute findings.   Review of the MIP images confirms the above findings   CTA HEAD FINDINGS   Anterior circulation: Bilateral intracranial ICAs, MCAs, and ACAs are patent without proximal hemodynamically significant stenosis. The low   Posterior circulation: Small/non dominant left vertebral artery makes minimal contribution to the basilar artery, anatomic variant. The dominant right vertebral artery, basilar artery and bilateral posterior cerebral arteries are patent without proximal hemodynamically significant stenosis.   Venous sinuses: As permitted by contrast timing, patent.   Anatomic variants: Detailed above.   Review of the MIP images confirms the above findings   IMPRESSION: CT head:   1. Hypoattenuation and loss of gray differentiation in the high right posterior frontal lobe, suspicious for acute infarct. Recommend MRI to further assess if the patient is able. 2. No acute hemorrhage.   CTA:   1. No emergent large vessel occlusion. 2. 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 3. Chronic widening of the atlantodental interval (up to 6 mm), suggestive of instability/laxity.   Echo (08/12/22): IMPRESSIONS   1. Left ventricular ejection fraction, by  estimation, is 55%. The left  ventricle has normal function. The left ventricle has no regional wall  motion abnormalities. Left  ventricular diastolic parameters are consistent  with Grade I diastolic dysfunction  (impaired relaxation).   2. Right ventricular systolic function is mildly reduced. The right  ventricular size is mildly enlarged. There is mildly elevated pulmonary  artery systolic pressure. The estimated right ventricular systolic  pressure is [id number] mmHg.   3. Right atrial size was mildly dilated.   4. The mitral valve is grossly normal. Trivial mitral valve  regurgitation. No evidence of mitral stenosis.   5. The aortic valve is grossly normal. Aortic valve regurgitation is  trivial. No aortic stenosis is present.   6. The inferior vena cava is normal in size with greater than 50%  respiratory variability, suggesting right atrial pressure of 3 mmHg.   CT cervical spine (09/06/19): CT CERVICAL SPINE FINDINGS   Alignment: Straightening of the cervical spine. No subluxation. Anterior offset of the spinal laminar line. Widening of the predental space up to 6 mm.   Skull base and vertebrae: Craniovertebral junction is intact. No fracture is seen.   Soft tissues and spinal canal: No prevertebral fluid collections. Narrowing of the canal at the C1-C2 articulation with mass effect on the anterior thecal sac.   Disc levels: Moderate-to-marked diffuse degenerative change C3 through C7 with disc space narrowing, vacuum disc and osteophytes. Multiple level facet degenerative change with resultant foraminal stenosis at multiple levels.   Upper chest: Lung apices are clear.  Carotid vascular calcification   Other: None   IMPRESSION: Straightening of the cervical spine with advanced degenerative change. No definitive fracture seen, however there is abnormal widening of the atlantodental interval, raising concern for ligamentous injury at this level given history of trauma.  Assessment/Plan:  This is [name], a 82 y.o. male with: Acute onset left sided weakness and numbness - CTH suspicious for  infarct in right frontal lobe. CTA with stenosis of right ICA, s/p right CEA on 10/01/22. Patient's numbness has resolved. He still has LUE weakness, which per my exam is similar to initial visit prior to 09/2022. Vertigo - likely BPPV given that it occurs with head position changes Orthostatic hypotension - lightheaded on standing quickly  Back pain - intermittent. No clear right sided leg weakness or radiation to suspect radicular pain; MSK vs related to recent urinary problems?  Plan: -Continue Xarelto 20 mg daily.  -Continue atorvastatin 80 mg daily -Continue meclizine, advised he does not to take scheduled but as needed and only for vertigo, not the lightheadedness when standing -Continue conservative management as previously discussed for vertigo (Epley) -Continue conservative management for orthostatic hypotension as discussed at last visit  Return to clinic in 1 year  Total time spent reviewing records, interview, history/exam, documentation, and coordination of care on day of encounter:  30 min  Kai Levins, MD

## 2023-01-10 ENCOUNTER — Other Ambulatory Visit: Payer: Self-pay | Admitting: Internal Medicine

## 2023-01-11 NOTE — Telephone Encounter (Signed)
Please refill as per office routine med refill policy (all routine meds to be refilled for 3 mo or monthly (per pt preference) up to one year from last visit, then month to month grace period for 3 mo, then further med refills will have to be denied) ? ?

## 2023-01-12 ENCOUNTER — Ambulatory Visit (INDEPENDENT_AMBULATORY_CARE_PROVIDER_SITE_OTHER): Payer: Medicare Other | Admitting: Pulmonary Disease

## 2023-01-12 ENCOUNTER — Encounter (HOSPITAL_BASED_OUTPATIENT_CLINIC_OR_DEPARTMENT_OTHER): Payer: Self-pay | Admitting: Internal Medicine

## 2023-01-12 ENCOUNTER — Encounter: Payer: Self-pay | Admitting: Pulmonary Disease

## 2023-01-12 ENCOUNTER — Ambulatory Visit (INDEPENDENT_AMBULATORY_CARE_PROVIDER_SITE_OTHER): Payer: Medicare Other | Admitting: Internal Medicine

## 2023-01-12 ENCOUNTER — Encounter: Payer: Self-pay | Admitting: Internal Medicine

## 2023-01-12 VITALS — BP 124/84 | HR 64 | Ht 69.0 in | Wt 210.5 lb

## 2023-01-12 VITALS — BP 132/82 | HR 65 | Ht 69.0 in | Wt 209.0 lb

## 2023-01-12 DIAGNOSIS — G4733 Obstructive sleep apnea (adult) (pediatric): Secondary | ICD-10-CM | POA: Diagnosis not present

## 2023-01-12 DIAGNOSIS — Z23 Encounter for immunization: Secondary | ICD-10-CM | POA: Diagnosis not present

## 2023-01-12 DIAGNOSIS — I5032 Chronic diastolic (congestive) heart failure: Secondary | ICD-10-CM

## 2023-01-12 DIAGNOSIS — I6529 Occlusion and stenosis of unspecified carotid artery: Secondary | ICD-10-CM

## 2023-01-12 DIAGNOSIS — I495 Sick sinus syndrome: Secondary | ICD-10-CM

## 2023-01-12 DIAGNOSIS — R8290 Unspecified abnormal findings in urine: Secondary | ICD-10-CM | POA: Insufficient documentation

## 2023-01-12 DIAGNOSIS — E785 Hyperlipidemia, unspecified: Secondary | ICD-10-CM

## 2023-01-12 DIAGNOSIS — J329 Chronic sinusitis, unspecified: Secondary | ICD-10-CM | POA: Insufficient documentation

## 2023-01-12 DIAGNOSIS — I480 Paroxysmal atrial fibrillation: Secondary | ICD-10-CM

## 2023-01-12 DIAGNOSIS — J449 Chronic obstructive pulmonary disease, unspecified: Secondary | ICD-10-CM

## 2023-01-12 DIAGNOSIS — E7841 Elevated Lipoprotein(a): Secondary | ICD-10-CM | POA: Diagnosis not present

## 2023-01-12 MED ORDER — ALBUTEROL SULFATE HFA 108 (90 BASE) MCG/ACT IN AERS: 2.0000 | INHALATION_SPRAY | Freq: Four times a day (QID) | RESPIRATORY_TRACT | 6 refills | Status: DC | PRN

## 2023-01-12 NOTE — Patient Instructions (Signed)
Medication Instructions:  NO CHANGES  *If you need a refill on your cardiac medications before your next appointment, please call your pharmacy*    Follow-Up: At [facility], you and your health needs are our priority.  As part of our continuing mission to provide you with exceptional heart care, we have created designated Provider Care Teams.  These Care Teams include your primary Cardiologist (physician) and Advanced Practice Providers (APPs -  Physician Assistants and Nurse Practitioners) who all work together to provide you with the care you need, when you need it.  We recommend signing up for the patient portal called "MyChart".  Sign up information is provided on this After Visit Summary.  MyChart is used to connect with patients for Virtual Visits (Telemedicine).  Patients are able to view lab/test results, encounter notes, upcoming appointments, etc.  Non-urgent messages can be sent to your provider as well.   To learn more about what you can do with MyChart, go to [email or web address].    Your next appointment:    AS NEEDED with Dr. Hilty  

## 2023-01-12 NOTE — Progress Notes (Signed)
LIPID CLINIC CONSULT NOTE  Chief Complaint:  Manage dyslipidemia  Primary Care Physician: Biagio Borg, MD  Primary Cardiologist:  Early Osmond, MD  HPI:  [name] is a 82 y.o. male who is being seen today for the evaluation of dyslipidemia at the request of Thukkani, Arlie Solomons, MD. this is a pleasant 82 year old male kindly referred for evaluation management of dyslipidemia.  Past medical history significant for chronic diastolic heart failure, PAF, sick sinus syndrome, hypertension, aortic atherosclerosis, coronary calcification with nonobstructive disease as well as prior stroke.  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  He has been on increasing doses of atorvastatin in addition to ezetimibe.  His most recent lipid profile shows total cholesterol 116, triglycerides 45, HDL 52 and LDL 55.  His calcium score was elevated at 667, 65th percentile for age and sex matched controls.  PMHx:  Past Medical History:  Diagnosis Date   Abdominal pain, epigastric    Allergic rhinitis, cause unspecified    Arthritis    BPH (benign prostatic hyperplasia)    CHF (congestive heart failure) (HCC)    Chronic anticoagulation    xarelto   Cognitive communication deficit 09/29/2022   Coronary artery calcification seen on CAT scan 12/26/2012   Depressive disorder, not elsewhere classified    Diabetes mellitus treated with oral medication ([place]) 09/28/2022   Diverticulosis of colon (without mention of hemorrhage)    Esophageal reflux    High risk medication use    Hyperlipidemia    Hyperplastic rectal polyp    Hypertension    Lumbago    Pacemaker    Paralyzed hemidiaphragm    LEFT   Persistent atrial fibrillation ([place])    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 ([place]) 09/29/2022   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 especially given the patient's history of COPD and left diaphragmatic paralysis diagnosed with RHC  08/2022   Sick sinus syndrome ([place]) 05/16/2009   s/p PPM ([employer]) by [name]   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 ([place]) 09/28/2022    Past Surgical History:  Procedure Laterality Date   CYSTOSCOPY WITH RETROGRADE PYELOGRAM, URETEROSCOPY AND STENT PLACEMENT Left 12/30/2021   Procedure: CYSTOSCOPY WITH RETROGRADE PYELOGRAM, URETEROSCOPY;  Surgeon: Remi Haggard, MD;  Location: [facility];  Service: Urology;  Laterality: Left;   ENDARTERECTOMY Right 10/01/2022   Procedure: ENDARTERECTOMY CAROTID;  Surgeon: Cherre Robins, MD;  Location: [facility] OR;  Service: Vascular;  Laterality: Right;   ESOPHAGOGASTRODUODENOSCOPY N/A 12/05/2013   Procedure: ESOPHAGOGASTRODUODENOSCOPY (EGD);  Surgeon: [name], MD;  Location: [facility];  Service: Endoscopy;  Laterality: N/A;   ESOPHAGOGASTRODUODENOSCOPY N/A 07/04/2015   Procedure: ESOPHAGOGASTRODUODENOSCOPY (EGD);  Surgeon: [name], MD;  Location: [facility];  Service: Endoscopy;  Laterality: N/A;   EXTRACORPOREAL SHOCK WAVE LITHOTRIPSY Left 10/13/2021   Procedure: EXTRACORPOREAL SHOCK WAVE LITHOTRIPSY (ESWL);  Surgeon: Lucas Mallow, MD;  Location: [facility];  Service: Urology;  Laterality: Left;  90 MINS   INSERT / REPLACE / REMOVE PACEMAKER  05/2009    [employer] [id number]  (serial number [id number]) dual-chamber pacemaker   LEAD REVISION/REPAIR N/A 10/15/2020   Procedure: LEAD REVISION/REPAIR;  Surgeon: Thompson Grayer, MD;  Location: [place] [facility];  Service: Cardiovascular;  Laterality: N/A;   PACEMAKER PLACEMENT Bilateral    '05   PATCH ANGIOPLASTY Right 10/01/2022   Procedure: RIGHT CAROTID ARTERY  PATCH ANGIOPLASTY WITH 1CM x 6CM XENOSURE BIOLOGIC PATCH;  Surgeon: Cherre Robins, MD;  Location: [place];  Service: Vascular;  Laterality: Right;   PPM GENERATOR CHANGEOUT N/A 10/15/2020   Procedure: PPM GENERATOR CHANGEOUT;  Surgeon: Thompson Grayer, MD;  Location: [place] [facility];  Service: Cardiovascular;  Laterality: N/A;   RIGHT HEART CATH N/A 09/09/2022   Procedure: RIGHT HEART CATH;  Surgeon: Early Osmond, MD;  Location: [place] [facility];  Service: Cardiovascular;  Laterality: N/A;    FAMHx:  Family History  Problem Relation Age of Onset   Diabetes Mother    Hypertension Mother    Hypertension Father    Lung cancer Father    Cancer Father    Asthma Sister    Hypertension Sister    Arthritis Paternal Grandmother    Hypertension Brother    Hypertension Daughter    Hypertension Son     SOCHx:   reports that he quit smoking about 31 years ago. His smoking use included cigarettes. He has a 10.00 pack-year smoking history. He has quit using smokeless tobacco.  His smokeless tobacco use included chew. He reports that he does not drink alcohol and does not use drugs.  ALLERGIES:  No Known Allergies  ROS: Pertinent items noted in HPI and remainder of comprehensive ROS otherwise negative.  HOME MEDS: Current Outpatient Medications on File Prior to Visit  Medication Sig Dispense Refill   acetaminophen (TYLENOL) 650 MG CR tablet Take 1,300 mg by mouth in the morning and at bedtime.     atorvastatin (LIPITOR) 80 MG tablet TAKE 1 TABLET BY MOUTH EVERY DAY 90 tablet 3   Budeson-Glycopyrrol-Formoterol (BREZTRI AEROSPHERE) 160-9-4.8 MCG/ACT AERO 1 puffs twice per day 10.7 g 11   Cholecalciferol (VITAMIN D3) 50 MCG (2000 UT) TABS Take 2,000 Units by mouth daily.     dorzolamide-timolol (COSOPT) 22.3-6.8 MG/ML ophthalmic solution Place 1 drop into both eyes in the morning and at bedtime.     ezetimibe (ZETIA) 10 MG tablet Take 1 tablet (10 mg total) by mouth daily. 90 tablet 3   fexofenadine (ALLEGRA) 180 MG tablet TAKE 1 TABLET (180 MG TOTAL) BY MOUTH DAILY. 90 tablet 1   flecainide (TAMBOCOR) 100 MG tablet TAKE 1 TABLET BY MOUTH TWICE A DAY 180 tablet 3   furosemide (LASIX) 40 MG tablet Take 40 mg by mouth as needed.     glipiZIDE (GLUCOTROL XL) 2.5 MG  24 hr tablet Take 1 tablet (2.5 mg total) by mouth daily with breakfast. 90 tablet 3   latanoprost (XALATAN) 0.005 % ophthalmic solution Place 1 drop into both eyes at bedtime.      meclizine (ANTIVERT) 12.5 MG tablet TAKE 1 TABLET BY MOUTH EVERY 8 HOURS AS NEEDED 30 tablet 5   metoprolol succinate (TOPROL XL) 25 MG 24 hr tablet Take 1 tablet (25 mg total) by mouth at bedtime. 90 tablet 2   nystatin (MYCOSTATIN) 100000 UNIT/ML suspension Take by mouth.     omeprazole (PRILOSEC) 40 MG capsule TAKE 1 CAPSULE BY MOUTH EVERY DAY AS NEEDED 90 capsule 2   psyllium (METAMUCIL) 0.52 g capsule Take 2 capsules by mouth daily.     tamsulosin (FLOMAX) 0.4 MG CAPS capsule TAKE 1 CAPSULE BY MOUTH TWICE A DAY 180 capsule 3   telmisartan (MICARDIS) 20 MG tablet Take 1 tablet (20 mg total) by mouth at bedtime. Resume from 10/06/22     traMADol (ULTRAM) 50 MG tablet TAKE 1 TABLET BY MOUTH EVERY  6 HOURS AS NEEDED FOR PAIN 120 tablet 2   XARELTO 20 MG TABS tablet TAKE 1 TABLET BY MOUTH DAILY WITH SUPPER 90 tablet 3   No current facility-administered medications on file prior to visit.    LABS/IMAGING: No results found for this or any previous visit (from the past 48 hour(s)). No results found.  LIPID PANEL:    Component Value Date/Time   CHOL 116 10/02/2022 0420   CHOL 127 08/12/2022 1558   TRIG 45 10/02/2022 0420   TRIG 31 10/25/2006 0935   HDL 52 10/02/2022 0420   HDL 59 08/12/2022 1558   CHOLHDL 2.2 10/02/2022 0420   VLDL 9 10/02/2022 0420   LDLCALC 55 10/02/2022 0420   LDLCALC 52 08/12/2022 1558   LDLDIRECT 183.4 12/26/2013 0911    WEIGHTS: Wt Readings from Last 3 Encounters:  01/12/23 210 lb 8 oz (95.5 kg)  01/06/23 209 lb (94.8 kg)  12/30/22 209 lb 6.4 oz (95 kg)    VITALS: BP 124/84 (BP Location: Right Arm, Patient Position: Sitting, Cuff Size: Normal)   Pulse 64   Ht '5\' 9"'$  (1.753 m)   Wt 210 lb 8 oz (95.5 kg)   SpO2 97%   BMI 31.09 kg/m    EXAM: Deferred  EKG: Deferred  ASSESSMENT: Mixed dyslipidemia, goal LDL less than 55 CAD, nonobstructive with elevated calcium score Sick sinus syndrome status post pacemaker History of stroke PAF on Xarelto 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   Mr. Sieger is at target LDL of 55 or lower on combination of the statin and ezetimibe.  He does have an elevated LP(a) at this point however would not qualify for PCSK9 inhibitor.  He has had prior stroke but is anticoagulated on Xarelto and this is presumably related to PAF.  We discussed options if he were to become intolerant or have issues with his current therapy 1 would consider a PCSK9 inhibitor.  Ultimately one might consider specific therapy for his elevated LP(a) once it is available, however despite his history he would not a qualified at a level less than 200 nmol/L for clinical trials.  Thanks again for the kind referral.  Follow-up with me as needed.  I would advise no changes to his regimen at this time.  Pixie Casino, MD, [facility], [place] Director of the Advanced Lipid Disorders &  Cardiovascular Risk Reduction Clinic Diplomate of the American Board of Clinical Lipidology Attending Cardiologist  Direct Dial: [phone]  Fax: [phone]  Website:  [email or web address] [email or web address] 01/12/2023, 9:25 AM

## 2023-01-12 NOTE — Assessment & Plan Note (Signed)
Last vitamin D Lab Results  Component Value Date   VD25OH 34.45 11/24/2021   Low, to start oral replacement

## 2023-01-12 NOTE — Assessment & Plan Note (Signed)
BP Readings from Last 3 Encounters:  01/12/23 132/82  01/12/23 124/84  01/06/23 130/76   Stable, pt to continue medical treatment toprol xl 25 mg qd, micardis 20 mg qd

## 2023-01-12 NOTE — Assessment & Plan Note (Signed)
At least mild to mod persistent, for ENT referral

## 2023-01-12 NOTE — Patient Instructions (Signed)
Referral to pulmonary rehab  Reschedule sleep study-in lab split-night study  Continue Breztri 2 puffs twice a day  Prescription for rescue inhaler to be used only as needed-to be used for shortness of breath or wheezing  Graded activities as tolerated  Will see you back in 3 months

## 2023-01-12 NOTE — Assessment & Plan Note (Signed)
Exam benign, for ua and culture

## 2023-01-12 NOTE — Progress Notes (Signed)
[name]    [id number]    [birth year]  Primary Care Physician:John, Hunt Oris, MD  Referring Physician: Biagio Borg, MD [address] [place],  [place] [address]  Chief complaint:   Patient being seen for excessive fatigue Follow-up for obstructive lung disease  HPI:  PFT with stage III COPD Reports shortness of breath with activity  Daytime fatigue, daytime sleepiness  Was scheduled a sleep study but was feeling poorly when study became DU He has not rescheduled at present  Had a stroke with left arm weakness  He does have a history of having a pacemaker, elevated left hemidiaphragm Lower extremity edema Shortness of breath on exertion History of diastolic heart failure  Stated he finds it difficult to walk 100 yards-exercise tolerance has not changed recently  He does have some back pain with exertion  Usually goes to bed between 9 and 11 PM, falls asleep watching TV Wakes up after about 2 hours Out of bed between 6 and 7 AM sometimes he will stay in bed till 8 AM  He naps on a daily basis  Not as active as he used to be secondary to just feeling tired almost always  Admits to snoring, is not aware that he has been told about witnessed apneas  He does have a history of obstructive lung disease with significant reversibility on his most recent PFT from 2021, most recent study from 08/12/2022 shows progression  Outpatient Encounter Medications as of 01/12/2023  Medication Sig   acetaminophen (TYLENOL) 650 MG CR tablet Take 1,300 mg by mouth in the morning and at bedtime.   atorvastatin (LIPITOR) 80 MG tablet TAKE 1 TABLET BY MOUTH EVERY DAY   Budeson-Glycopyrrol-Formoterol (BREZTRI AEROSPHERE) 160-9-4.8 MCG/ACT AERO 1 puffs twice per day   Cholecalciferol (VITAMIN D3) 50 MCG (2000 UT) TABS Take 2,000 Units by mouth daily.   dorzolamide-timolol (COSOPT) 22.3-6.8 MG/ML ophthalmic solution Place 1 drop into both eyes in the morning and at bedtime.    ezetimibe (ZETIA) 10 MG tablet Take 1 tablet (10 mg total) by mouth daily.   fexofenadine (ALLEGRA) 180 MG tablet TAKE 1 TABLET (180 MG TOTAL) BY MOUTH DAILY.   flecainide (TAMBOCOR) 100 MG tablet TAKE 1 TABLET BY MOUTH TWICE A DAY   furosemide (LASIX) 40 MG tablet Take 40 mg by mouth as needed.   glipiZIDE (GLUCOTROL XL) 2.5 MG 24 hr tablet Take 1 tablet (2.5 mg total) by mouth daily with breakfast.   latanoprost (XALATAN) 0.005 % ophthalmic solution Place 1 drop into both eyes at bedtime.    meclizine (ANTIVERT) 12.5 MG tablet TAKE 1 TABLET BY MOUTH EVERY 8 HOURS AS NEEDED   metoprolol succinate (TOPROL XL) 25 MG 24 hr tablet Take 1 tablet (25 mg total) by mouth at bedtime.   nystatin (MYCOSTATIN) 100000 UNIT/ML suspension Take by mouth.   omeprazole (PRILOSEC) 40 MG capsule TAKE 1 CAPSULE BY MOUTH EVERY DAY AS NEEDED   psyllium (METAMUCIL) 0.52 g capsule Take 2 capsules by mouth daily.   tamsulosin (FLOMAX) 0.4 MG CAPS capsule TAKE 1 CAPSULE BY MOUTH TWICE A DAY   telmisartan (MICARDIS) 20 MG tablet Take 1 tablet (20 mg total) by mouth at bedtime. Resume from 10/06/22   traMADol (ULTRAM) 50 MG tablet TAKE 1 TABLET BY MOUTH EVERY 6 HOURS AS NEEDED FOR PAIN   XARELTO 20 MG TABS tablet TAKE 1 TABLET BY MOUTH DAILY WITH SUPPER   [DISCONTINUED] atorvastatin (LIPITOR) 80  MG tablet TAKE 1 TABLET BY MOUTH EVERY DAY   No facility-administered encounter medications on file as of 01/12/2023.    Allergies as of 01/12/2023   (No Known Allergies)    Past Medical History:  Diagnosis Date   Abdominal pain, epigastric    Allergic rhinitis, cause unspecified    Arthritis    BPH (benign prostatic hyperplasia)    CHF (congestive heart failure) (HCC)    Chronic anticoagulation    xarelto   Cognitive communication deficit 09/29/2022   Coronary artery calcification seen on CAT scan 12/26/2012   Depressive disorder, not elsewhere classified    Diabetes mellitus treated with oral medication ([place])  09/28/2022   Diverticulosis of colon (without mention of hemorrhage)    Esophageal reflux    High risk medication use    Hyperlipidemia    Hyperplastic rectal polyp    Hypertension    Lumbago    Pacemaker    Paralyzed hemidiaphragm    LEFT   Persistent atrial fibrillation ([place])    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 ([place]) 09/29/2022   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 especially given the patient's history of COPD and 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   Sick sinus syndrome ([place]) 05/16/2009   s/p PPM ([employer]) by [facility]   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 ([place]) 09/28/2022    Past Surgical History:  Procedure Laterality Date   CYSTOSCOPY WITH RETROGRADE PYELOGRAM, URETEROSCOPY AND STENT PLACEMENT Left 12/30/2021   Procedure: CYSTOSCOPY WITH RETROGRADE PYELOGRAM, URETEROSCOPY;  Surgeon: Remi Haggard, MD;  Location: [facility];  Service: Urology;  Laterality: Left;   ENDARTERECTOMY Right 10/01/2022   Procedure: ENDARTERECTOMY CAROTID;  Surgeon: Cherre Robins, MD;  Location: [facility];  Service: Vascular;  Laterality: Right;   ESOPHAGOGASTRODUODENOSCOPY N/A 12/05/2013   Procedure: ESOPHAGOGASTRODUODENOSCOPY (EGD);  Surgeon: [name], MD;  [facility];  Service: Endoscopy;  Laterality: N/A;   ESOPHAGOGASTRODUODENOSCOPY N/A 07/04/2015   Procedure: ESOPHAGOGASTRODUODENOSCOPY (EGD);  Surgeon: [name], MD;  Location: [facility];  Service: Endoscopy;  Laterality: N/A;   EXTRACORPOREAL SHOCK WAVE LITHOTRIPSY Left 10/13/2021   Procedure: EXTRACORPOREAL SHOCK WAVE LITHOTRIPSY (ESWL);  Surgeon: Lucas Mallow, MD;  Location: [facility];  Service: Urology;  Laterality: Left;  90 MINS   INSERT / REPLACE / REMOVE PACEMAKER  05/2009    [employer] [id number]  (serial number [id number]) dual-chamber pacemaker   LEAD REVISION/REPAIR N/A 10/15/2020   Procedure: LEAD  REVISION/REPAIR;  Surgeon: Thompson [place]er, MD;  Location: [place] [facility];  Service: Cardiovascular;  Laterality: N/A;   PACEMAKER PLACEMENT Bilateral    '05   PATCH ANGIOPLASTY Right 10/01/2022   Procedure: RIGHT CAROTID ARTERY PATCH ANGIOPLASTY WITH 1CM x 6CM XENOSURE BIOLOGIC PATCH;  Surgeon: Cherre Robins, MD;  Location: [place];  Service: Vascular;  Laterality: Right;   PPM GENERATOR CHANGEOUT N/A 10/15/2020   Procedure: PPM GENERATOR CHANGEOUT;  Surgeon: Thompson Grayer, MD;  Location: [place] [facility];  Service: Cardiovascular;  Laterality: N/A;   RIGHT HEART CATH N/A 09/09/2022   Procedure: RIGHT HEART CATH;  Surgeon: Early Osmond, MD;  Location: [place] [facility];  Service: Cardiovascular;  Laterality: N/A;    Family History  Problem Relation Age of Onset   Diabetes Mother    Hypertension Mother    Hypertension Father    Lung cancer Father    Cancer Father  Asthma Sister    Hypertension Sister    Arthritis Paternal Grandmother    Hypertension Brother    Hypertension Daughter    Hypertension Son     Social History   Socioeconomic History   Marital status: Married    Spouse name: Not on file   Number of children: 4   Years of education: Not on file   Highest education level: Not on file  Occupational History   Occupation: retired    Fish farm manager: RETIRED  Tobacco Use   Smoking status: Former    Packs/day: 0.50    Years: 20.00    Total pack years: 10.00    Types: Cigarettes    Quit date: 11/17/1991    Years since quitting: 31.1   Smokeless tobacco: Former    Types: Nurse, children's Use: Never used  Substance and Sexual Activity   Alcohol use: No   Drug use: No   Sexual activity: Never  Other Topics Concern   Not on file  Social History Narrative   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    Caffeine 1-2 day cups   Right handed   Tri-level home      Do you live at home alone?with wife              Social Determinants of Health   Financial  Resource Strain: Low Risk  (08/03/2022)   Overall Financial Resource Strain (CARDIA)    Difficulty of Paying Living Expenses: Not hard at all  Food Insecurity: No Food Insecurity (10/02/2022)   Hunger Vital Sign    Worried About Running Out of Food in the Last Year: Never true    Ran Out of Food in the Last Year: Never true  Transportation Needs: No Transportation Needs (10/02/2022)   PRAPARE - Hydrologist (Medical): No    Lack of Transportation (Non-Medical): No  Physical Activity: Inactive (08/03/2022)   Exercise Vital Sign    Days of Exercise per Week: 0 days    Minutes of Exercise per Session: 0 min  Stress: No Stress Concern Present (08/03/2022)   [place]    Feeling of Stress : Not at all  Social Connections: [place] (08/03/2022)   Social Connection and Isolation Panel [NHANES]    Frequency of Communication with Friends and Family: More than three times a week    Frequency of Social Gatherings with Friends and Family: More than three times a week    Attends Religious Services: More than 4 times per year    Active Member of [employer] or Organizations: Yes    Attends Archivist Meetings: More than 4 times per year    Marital Status: Married  Human resources officer Violence: Not At Risk (10/02/2022)   Humiliation, Afraid, Rape, and Kick questionnaire    Fear of Current or Ex-Partner: No    Emotionally Abused: No    Physically Abused: No    Sexually Abused: No    Review of Systems  Constitutional:  Positive for fatigue.  Respiratory:  Positive for shortness of breath.   Psychiatric/Behavioral:  Positive for sleep disturbance.     Vitals:   01/12/23 1602  BP: 132/82  Pulse: 65  SpO2: 96%     Physical Exam Constitutional:      Appearance: He is obese.  HENT:     Head: Normocephalic.     Mouth/Throat:     Mouth: Mucous membranes are  moist.  Cardiovascular:      Rate and Rhythm: Normal rate and regular rhythm.     Heart sounds: No murmur heard.    No friction rub.  Pulmonary:     [place]: No respiratory distress.     Breath sounds: No stridor. No wheezing or rhonchi.     Comments: Decreased air movement bilaterally Musculoskeletal:     Cervical back: No rigidity or tenderness.  Neurological:     Mental Status: He is alert.  Psychiatric:        Mood and Affect: Mood normal.       05/06/2022    4:00 PM  Results of the Epworth flowsheet  Sitting and reading 3  Watching TV 3  Sitting, inactive in a public place (e.g. a theatre or a meeting) 0  As a passenger in a car for an hour without a break 0  Lying down to rest in the afternoon when circumstances permit 1  Sitting and talking to someone 0  Sitting quietly after a lunch without alcohol 3  In a car, while stopped for a few minutes in traffic 0  Total score 10    Data Reviewed: Chest x-ray from 2021 and previously reviewed with patient showing elevated left hemidiaphragm  Most recent PFT from 2023 shows severe obstructive disease with no significant bronchodilator response,   Assessment:  Severe obstructive lung disease  Stage III COPD  Class III pulmonary hypertension  Shortness of breath on exertion -Likely multifactorial  Fatigue  Excessive daytime sleepiness -Possibility of obstructive sleep apnea discussed -Importance of getting a sleep study and treating sleep disordered breathing discussed  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

## 2023-01-14 ENCOUNTER — Encounter: Payer: Self-pay | Admitting: Neurology

## 2023-01-14 ENCOUNTER — Ambulatory Visit (INDEPENDENT_AMBULATORY_CARE_PROVIDER_SITE_OTHER): Payer: Medicare Other | Admitting: Neurology

## 2023-01-14 VITALS — BP 123/72 | HR 60 | Ht 69.0 in | Wt 210.0 lb

## 2023-01-14 DIAGNOSIS — I951 Orthostatic hypotension: Secondary | ICD-10-CM

## 2023-01-14 DIAGNOSIS — R29898 Other symptoms and signs involving the musculoskeletal system: Secondary | ICD-10-CM | POA: Diagnosis not present

## 2023-01-14 DIAGNOSIS — R209 Unspecified disturbances of skin sensation: Secondary | ICD-10-CM

## 2023-01-14 DIAGNOSIS — I639 Cerebral infarction, unspecified: Secondary | ICD-10-CM | POA: Diagnosis not present

## 2023-01-14 DIAGNOSIS — H8110 Benign paroxysmal vertigo, unspecified ear: Secondary | ICD-10-CM | POA: Diagnosis not present

## 2023-01-14 DIAGNOSIS — R200 Anesthesia of skin: Secondary | ICD-10-CM | POA: Diagnosis not present

## 2023-01-14 DIAGNOSIS — R7303 Prediabetes: Secondary | ICD-10-CM | POA: Diagnosis not present

## 2023-01-14 DIAGNOSIS — R42 Dizziness and giddiness: Secondary | ICD-10-CM

## 2023-01-14 NOTE — Patient Instructions (Signed)
Our plan today: -Continue Xarelto 20 mg daily.  -Continue atorvastatin 80 mg daily -Continue meclizine as needed for vertigo -Continue conservative management as previously discussed for vertigo ([place]) -Continue conservative management for orthostatic hypotension as discussed at last visit  Return to clinic in 1 year  The physicians and staff at [facility] are committed to providing excellent care. You may receive a survey requesting feedback about your experience at our office. We strive to receive "very good" responses to the survey questions. If you feel that your experience would prevent you from giving the office a "very good " response, please contact our office to try to remedy the situation. We may be reached at [phone]. Thank you for taking the time out of your busy day to complete the survey.  Kai Levins, MD [facility]

## 2023-01-15 DIAGNOSIS — D259 Leiomyoma of uterus, unspecified: Secondary | ICD-10-CM | POA: Diagnosis not present

## 2023-01-15 DIAGNOSIS — K5730 Diverticulosis of large intestine without perforation or abscess without bleeding: Secondary | ICD-10-CM | POA: Diagnosis not present

## 2023-01-15 DIAGNOSIS — R3121 Asymptomatic microscopic hematuria: Secondary | ICD-10-CM | POA: Diagnosis not present

## 2023-01-15 DIAGNOSIS — R1084 Generalized abdominal pain: Secondary | ICD-10-CM | POA: Diagnosis not present

## 2023-01-25 ENCOUNTER — Telehealth: Payer: Self-pay | Admitting: Pulmonary Disease

## 2023-01-25 DIAGNOSIS — J449 Chronic obstructive pulmonary disease, unspecified: Secondary | ICD-10-CM

## 2023-01-25 NOTE — Telephone Encounter (Signed)
ATC patient. Per DPR, left detailed vm letting patient know he has stage 3 COPD and the referral placed for pulmonary rehab has been put in.   Nothing further needed.

## 2023-01-25 NOTE — Telephone Encounter (Signed)
Patient with stage III COPD -Last PFT 07/21/2022-FEV1 38%  Referral for pulmonary rehab

## 2023-01-26 ENCOUNTER — Ambulatory Visit (HOSPITAL_BASED_OUTPATIENT_CLINIC_OR_DEPARTMENT_OTHER): Payer: Medicare Other | Attending: Pulmonary Disease | Admitting: Pulmonary Disease

## 2023-01-26 VITALS — Ht 69.0 in | Wt 209.0 lb

## 2023-01-26 DIAGNOSIS — G4733 Obstructive sleep apnea (adult) (pediatric): Secondary | ICD-10-CM | POA: Diagnosis not present

## 2023-01-26 DIAGNOSIS — G4736 Sleep related hypoventilation in conditions classified elsewhere: Secondary | ICD-10-CM | POA: Diagnosis not present

## 2023-01-28 ENCOUNTER — Telehealth (HOSPITAL_COMMUNITY): Payer: Self-pay

## 2023-01-28 NOTE — Telephone Encounter (Signed)
Attempted to call patient in regards to Pulmonary Rehab - LM on VM   Sent letter via [employer]

## 2023-01-28 NOTE — Telephone Encounter (Signed)
Pt is not interested in the pulmonary rehab program. Closed referral. 

## 2023-02-01 ENCOUNTER — Telehealth: Payer: Self-pay | Admitting: Pulmonary Disease

## 2023-02-01 DIAGNOSIS — G4733 Obstructive sleep apnea (adult) (pediatric): Secondary | ICD-10-CM

## 2023-02-01 NOTE — Procedures (Signed)
POLYSOMNOGRAPHY  Last, First: [name], [name] MRN: [id number] Gender: Male Age (years): 55 Weight (lbs): 209 DOB: Dec 20, 1940 BMI: 31 Primary Care: No PCP Epworth Score: 14 Referring: Laurin Coder MD Technician: Carolin Coy Interpreting: Laurin Coder MD Study Type: NPSG Ordered Study Type: Split Night CPAP Study date: 01/26/2023 Location: [place] CLINICAL INFORMATION [name] is a 82 year old Male and was referred to the sleep center for evaluation of G47.33 OSA: Adult and Pediatric (327.23). Indications include Congestive Heart Failure, COPD, Excessive Daytime Sleepiness, Hypertension, Obesity.  MEDICATIONS Patient self administered medications include: METOPROLOL SUCCINATE, MICARDIS, TYLENOL ARTHRITIS, XARELTO, ZETIA. Medications administered during study include Sleep medicine administered - None  SLEEP STUDY TECHNIQUE A multi-channel overnight Polysomnography study was performed. The channels recorded and monitored were central and occipital EEG, electrooculogram (EOG), submentalis EMG (chin), nasal and oral airflow, thoracic and abdominal wall motion, anterior tibialis EMG, snore microphone, electrocardiogram, and a pulse oximetry. TECHNICIAN COMMENTS Comments added by Technician: PATIENT WAS ORDERED AS A SPLIT NIGHT STUDY Comments added by Scorer: N/A SLEEP ARCHITECTURE The study was initiated at 10:55:21 PM and terminated at 4:53:22 AM. The total recorded time was 358 minutes. EEG confirmed total sleep time was 82.5 minutes yielding a sleep efficiency of 23.0%. Sleep onset after lights out was 44.9 minutes with a REM latency of N/A minutes. The patient spent 55.8% of the night in stage N1 sleep, 44.2% in stage N2 sleep, 0.0% in stage N3 and 0% in REM. Wake after sleep onset (WASO) was 230.6 minutes. The Arousal Index was 76.4/hour. RESPIRATORY PARAMETERS There were a total of 82 respiratory disturbances out of which 3 were apneas ( 3 obstructive, 0 mixed, 0 central)  and 79 hypopneas. The apnea/hypopnea index (AHI) was 59.6 events/hour. The central sleep apnea index was 0 events/hour. The REM AHI was N/A events/hour and NREM AHI was 59.6 events/hour. The supine AHI was 59.6 events/hour and the non supine AHI was 0 supine during 100.00% of sleep. Respiratory disturbances were associated with oxygen desaturation down to a nadir of 82.0% during sleep. The mean oxygen saturation during the study was 91.9%. The cumulative time under 88% oxygen saturation was 5.5 minutes.  LEG MOVEMENT DATA The total leg movements were 0 with a resulting leg movement index of 0.0/hr .Associated arousal with leg movement index was 0.0/hr.  CARDIAC DATA The underlying cardiac rhythm was most consistent with sinus rhythm. Mean heart rate during sleep was 60.2 bpm. Additional rhythm abnormalities include None.  IMPRESSIONS - Severe Obstructive Sleep apnea(OSA) - EKG showed no cardiac abnormalities. - Severe Oxygen Desaturation - The patient snored with moderate snoring volume. - No significant periodic leg movements(PLMs) during sleep. However, no significant associated arousals.  DIAGNOSIS - Obstructive Sleep Apnea (G47.33) - Nocturnal Hypoxemia (G47.36)  RECOMMENDATIONS - Therapeutic CPAP titration to determine optimal pressure required to alleviate sleep disordered breathing. - Positional therapy avoiding supine position during sleep. - Avoid alcohol, sedatives and other CNS depressants that may worsen sleep apnea and disrupt normal sleep architecture. - Sleep hygiene should be reviewed to assess factors that may improve sleep quality. - Weight management and regular exercise should be initiated or continued.  [Electronically signed] 02/01/2023 05:32 PM  Sherrilyn Rist MD NPI: [id number]

## 2023-02-01 NOTE — Telephone Encounter (Signed)
Call patient  Sleep study result  Date of study: 01/26/2023  Impression: Severe obstructive sleep apnea Severe oxygen desaturations  Recommendation:  Patient needs scheduled for a titration study, may require oxygen supplementation as he had severe oxygen desaturations during the study  Schedule for follow-up to further discuss options of treatment

## 2023-02-02 ENCOUNTER — Telehealth: Payer: Self-pay

## 2023-02-02 NOTE — Patient Outreach (Signed)
  Care Coordination   Initial Visit Note   02/02/2023 Name: YEIREN GREGUS MRN: [id number] DOB: [birth year]  KASAUN BARBOSA is a 82 y.o. year old male who sees Biagio Borg, MD for primary care. I spoke with  [name] by phone today.  What matters to the patients health and wellness today?  Mr. Tiano reports this is not a good time and request to schedule telephone assessment for tomorrow.   SDOH assessments and interventions completed:  No  Care Coordination Interventions:  No, not indicated   Follow up plan: Follow up call scheduled for 02/03/23    Encounter Outcome:  Pt. Visit Completed   Thea Silversmith, RN, MSN, BSN, [place] Coordinator [phone]

## 2023-02-03 ENCOUNTER — Ambulatory Visit: Payer: Self-pay

## 2023-02-03 DIAGNOSIS — Z139 Encounter for screening, unspecified: Secondary | ICD-10-CM

## 2023-02-03 DIAGNOSIS — J441 Chronic obstructive pulmonary disease with (acute) exacerbation: Secondary | ICD-10-CM

## 2023-02-03 NOTE — Patient Outreach (Signed)
  Care Coordination   Initial Visit Note   02/03/2023 Name: [name] MRN: [id number] DOB: 15-Jun-1941  [name] is a 82 y.o. year old male who sees [name] Borg, MD for primary care. I spoke with  [name] by phone today.  What matters to the patients health and wellness today?  [name] denies any disease management or resource needs. He reports his only concern is that he is unable to afford the [place]. He states he has been using coupons, but when he does not have a coupon, he will not be able to afford.  Goals Addressed             This Visit's Progress    Assistance with Medication cost       Interventions Today    Flowsheet Row Most Recent Value  Chronic Disease   Chronic disease during today's visit Chronic Obstructive Pulmonary Disease (COPD), Atrial Fibrillation (AFib), Hypertension (HTN), Diabetes  General Interventions   General Interventions Discussed/Reviewed General Interventions Discussed  [discussed care coordination program. provided contact number and encouraged to call for care coordination needs. evaluation of current treatment plan [id number], DM, HTN,a-fib  and patient's adherance to plan as established by provider.]  Education Interventions   Education Provided Provided Education  Provided Verbal Education On When to see the doctor  [enocuraged to contact provider with any health questions or concerns.]  Pharmacy Interventions   Pharmacy Dicussed/Reviewed Pharmacy Topics Discussed, Medication Adherence, Affording Medications, Referral to Pharmacist  Medication Adherence --  [currently using coupon]  Referral to Pharmacist Cannot afford medications  [[name] reports unable to afford cost $400. if did not have coupon would not be able to get.]           SDOH assessments and interventions completed:  Yes  SDOH Interventions Today    Flowsheet Row Most Recent Value  SDOH Interventions   Food Insecurity Interventions Intervention Not Indicated   Housing Interventions Intervention Not Indicated  Transportation Interventions Intervention Not Indicated  Utilities Interventions Intervention Not Indicated     Care Coordination Interventions:  Yes, provided   Follow up plan: Follow up call scheduled for 03/05/23    Encounter Outcome:  Pt. Visit Completed   [name] Silversmith, RN, MSN, BSN, [place] Coordinator [phone]

## 2023-02-03 NOTE — Patient Instructions (Signed)
Visit Information  Thank you for taking time to visit with me today. Please don't hesitate to contact me if I can be of assistance to you.   Following are the goals we discussed today:   Goals Addressed             This Visit's Progress    Assistance with Medication cost       Interventions Today    Flowsheet Row Most Recent Value  Chronic Disease   Chronic disease during today's visit Chronic Obstructive Pulmonary Disease (COPD), Atrial Fibrillation (AFib), Hypertension (HTN), Diabetes  General Interventions   General Interventions Discussed/Reviewed General Interventions Discussed  [discussed care coordination program. provided contact number and encouraged to call for care coordination needs. evaluation of current treatment plan [id number], DM, HTN,a-fib  and patient's adherance to plan as established by provider.]  Education Interventions   Education Provided Provided Education  Provided Verbal Education On When to see the doctor  [enocuraged to contact provider with any health questions or concerns.]  Pharmacy Interventions   Pharmacy Dicussed/Reviewed Pharmacy Topics Discussed, Medication Adherence, Affording Medications, Referral to Pharmacist  Medication Adherence --  [currently using coupon]  Referral to Pharmacist Cannot afford medications  [Mr. Garry reports unable to afford cost $400. if did not have coupon would not be able to get.]           Our next appointment is by telephone on 03/05/23 at 11am  Please call the care guide team at [phone] if you need to cancel or reschedule your appointment.   If you are experiencing a Mental Health or [place] or need someone to talk to, please call the Suicide and Crisis Lifeline: [place], RN, MSN, BSN, [place] [phone]

## 2023-02-04 ENCOUNTER — Ambulatory Visit (HOSPITAL_BASED_OUTPATIENT_CLINIC_OR_DEPARTMENT_OTHER): Payer: Medicare Other | Admitting: Internal Medicine

## 2023-02-04 ENCOUNTER — Telehealth: Payer: Self-pay

## 2023-02-04 NOTE — Progress Notes (Signed)
   Care Guide Note  02/04/2023 Name: ANGLE HEROD MRN: [id number] DOB: 1941-03-08  Referred by: Biagio Borg, MD Reason for referral : Care Coordination (Outreach to schedule with pharm d )   ABANOUB SHABAN is a 82 y.o. year old male who is a primary care patient of Biagio Borg, MD. [name] was referred to the pharmacist for assistance related to COPD.    Successful contact was made with the patient to discuss pharmacy services including being ready for the pharmacist to call at least 5 minutes before the scheduled appointment time, to have medication bottles and any blood sugar or blood pressure readings ready for review. The patient agreed to meet with the pharmacist via with the pharmacist via telephone visit on (date/time).  02/10/2023  Noreene Larsson, [place], [place] [address] Direct Dial: [phone] [email or web address]

## 2023-02-05 NOTE — Telephone Encounter (Signed)
ATC x1 patient regarding sleep study result's.LVM for patient to call our office back.

## 2023-02-08 NOTE — Telephone Encounter (Signed)
Pt. Calling back to go over HST

## 2023-02-09 NOTE — Telephone Encounter (Signed)
I called and spoke with the pt and notified of results and recommendations from Dr Ander Slade  He verbalized understanding and was agreeable to titration CPAP  I have ordered this and nothing further needed at this time

## 2023-02-10 ENCOUNTER — Other Ambulatory Visit: Payer: Medicare Other
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
## 2023-02-23 DIAGNOSIS — H401131 Primary open-angle glaucoma, bilateral, mild stage: Secondary | ICD-10-CM | POA: Diagnosis not present

## 2023-02-23 DIAGNOSIS — H2513 Age-related nuclear cataract, bilateral: Secondary | ICD-10-CM | POA: Diagnosis not present

## 2023-02-28 ENCOUNTER — Ambulatory Visit (HOSPITAL_BASED_OUTPATIENT_CLINIC_OR_DEPARTMENT_OTHER): Payer: Medicare Other | Attending: Pulmonary Disease | Admitting: Pulmonary Disease

## 2023-02-28 DIAGNOSIS — I110 Hypertensive heart disease with heart failure: Secondary | ICD-10-CM | POA: Insufficient documentation

## 2023-02-28 DIAGNOSIS — I509 Heart failure, unspecified: Secondary | ICD-10-CM | POA: Insufficient documentation

## 2023-02-28 DIAGNOSIS — E669 Obesity, unspecified: Secondary | ICD-10-CM | POA: Diagnosis not present

## 2023-02-28 DIAGNOSIS — G4700 Insomnia, unspecified: Secondary | ICD-10-CM | POA: Diagnosis not present

## 2023-02-28 DIAGNOSIS — G4733 Obstructive sleep apnea (adult) (pediatric): Secondary | ICD-10-CM | POA: Diagnosis not present

## 2023-02-28 DIAGNOSIS — J449 Chronic obstructive pulmonary disease, unspecified: Secondary | ICD-10-CM | POA: Diagnosis not present

## 2023-03-02 DIAGNOSIS — B370 Candidal stomatitis: Secondary | ICD-10-CM | POA: Diagnosis not present

## 2023-03-03 DIAGNOSIS — H1133 Conjunctival hemorrhage, bilateral: Secondary | ICD-10-CM | POA: Diagnosis not present

## 2023-03-05 ENCOUNTER — Ambulatory Visit: Payer: Self-pay

## 2023-03-05 NOTE — Patient Instructions (Addendum)
Visit Information  Thank you for taking time to visit with me today. Please don't hesitate to contact me if I can be of assistance to you.   Following are the goals we discussed today:   Goals Addressed             This Visit's Progress    Assist with health management       Interventions Today    Flowsheet Row Most Recent Value  Chronic Disease   Chronic disease during today's visit Chronic Obstructive Pulmonary Disease (COPD), Congestive Heart Failure (CHF)  General Interventions   General Interventions Discussed/Reviewed General Interventions Reviewed, Doctor Visits  Doctor Visits Discussed/Reviewed Doctor Visits Discussed  [advised to contact provider if condition does not improve or worsens. Patient has [facility] appointment on tuesday 03/09/23.]  Education Interventions   Education Provided Provided Education  [reviewed signs/symptoms of HF exacerbation, discussed triggers for COPD exacerbation, encouraged to use inhaler and encouraged to pay attention to if prn inhaler helps. encouraged to weigh self daily and record. discussed fluid limitation <2l/day for HF.]  Provided Verbal Education On Other, When to see the doctor  [advised to check O2 sat when SOB. discussed use of as needed fureosemide. encouraged to continue to follow up with provider as scheduled. RNCM contact number provided and encouraged to call if any care coordination needs-provided contact number.]  Nutrition Interventions   Nutrition Discussed/Reviewed Nutrition Reviewed  [discussed avoiding processed food and fluid intake. patient states he is drinking about 1lter a day, but not more than 2L/day.]  Pharmacy Interventions   Pharmacy Dicussed/Reviewed Pharmacy Topics Reviewed           COMPLETED: Assistance with Medication cost       Interventions Today    Flowsheet Row Most Recent Value  Chronic Disease   Chronic disease during today's visit Chronic Obstructive Pulmonary Disease (COPD)  General  Interventions   General Interventions Discussed/Reviewed General Interventions Reviewed  Education Interventions   Education Provided Provided Education  Provided Verbal Education On Other  [encouraged to continue to follow up with provider as scheduled. RNCM contact number provided and encouraged to call if any care coordination needs-provided contact number.]  Pharmacy Interventions   Pharmacy Dicussed/Reviewed Pharmacy Topics Reviewed           Our next appointment is by telephone on 03/26/23 at 10:30 am  Please call the care guide team at [phone] if you need to cancel or reschedule your appointment.   If you are experiencing a Mental Health or Behavioral Health Crisis or need someone to talk to, please call the Suicide and Crisis Lifeline: 20  Kathyrn Sheriff, RN, MSN, BSN, CCM [facility] Care Coordinator [phone]

## 2023-03-05 NOTE — Patient Outreach (Signed)
Care Coordination   Follow Up Visit Note   03/05/2023 Name: [name] MRN: [id number] DOB: [birth year]  [name] is a 82 y.o. year old male who sees [name] Levins, MD for primary care. I spoke with  [name] by phone today.  What matters to the patients health and wellness today? RNCM called to follow up to see if any additional care coordination needs. Per clinical pharmacist, [name], patient has a new copay card for [facility] and copay should be zero. [name] reports he just picked up medications. He states he did not pay attention to what he paid for each medication. Therefore does not know if the cost of Breztri was zero. Patient to contact his pharmacy to get clarity on cost and notify RNCM if any questions. Reports low back pain and states has an appointment with PCP on 03/09/23. [name] reports he has also noticed he is a little more winded with ambulation and little more puffiness on his feet. He states he has prn furosemide and will take if he feels he needs it. And will continue to take medications for COPD as prescribed. He states will discuss with provider at upcoming office visit next week.  Patient returned call prior to finishing note and states he paid $47 for [facility] because he did not take new co-pay card to pharmacy. He states he will take the new card to pharmacy to process.  Goals Addressed             This Visit's Progress    Assist with health management       Interventions Today    Flowsheet Row Most Recent Value  Chronic Disease   Chronic disease during today's visit Chronic Obstructive Pulmonary Disease (COPD), Congestive Heart Failure (CHF)  General Interventions   General Interventions Discussed/Reviewed General Interventions Reviewed, Doctor Visits  Doctor Visits Discussed/Reviewed Doctor Visits Discussed  [advised to contact provider if condition does not improve or worsens. Patient has [facility] appointment on tuesday 03/09/23.]  Education  Interventions   Education Provided Provided Education  [reviewed signs/symptoms of HF exacerbation, discussed triggers for COPD exacerbation, encouraged to use inhaler and encouraged to pay attention to if prn inhaler helps. encouraged to weigh self daily and record. discussed fluid limitation <2l/day for HF.]  Provided Verbal Education On Other, When to see the doctor  [advised to check O2 sat when SOB. discussed use of as needed fureosemide. encouraged to continue to follow up with provider as scheduled. RNCM contact number provided and encouraged to call if any care coordination needs-provided contact number.]  Nutrition Interventions   Nutrition Discussed/Reviewed Nutrition Reviewed  [discussed avoiding processed food and fluid intake. patient states he is drinking about 1lter a day, but not more than 2L/day.]  Pharmacy Interventions   Pharmacy Dicussed/Reviewed Pharmacy Topics Reviewed           COMPLETED: Assistance with Medication cost       Interventions Today    Flowsheet Row Most Recent Value  Chronic Disease   Chronic disease during today's visit Chronic Obstructive Pulmonary Disease (COPD)  General Interventions   General Interventions Discussed/Reviewed General Interventions Reviewed  Education Interventions   Education Provided Provided Education  Provided Verbal Education On Other  [encouraged to continue to follow up with provider as scheduled. RNCM contact number provided and encouraged to call if any care coordination needs-provided contact number.]  Pharmacy Interventions   Pharmacy Dicussed/Reviewed Pharmacy Topics Reviewed  SDOH assessments and interventions completed:  No  Care Coordination Interventions:  Yes, provided   Follow up plan: Follow up call scheduled for 03/26/23    Encounter Outcome:  Pt. Visit Completed

## 2023-03-09 ENCOUNTER — Ambulatory Visit (INDEPENDENT_AMBULATORY_CARE_PROVIDER_SITE_OTHER): Payer: Medicare Other | Admitting: Internal Medicine

## 2023-03-09 ENCOUNTER — Encounter: Payer: Self-pay | Admitting: Internal Medicine

## 2023-03-09 ENCOUNTER — Ambulatory Visit (INDEPENDENT_AMBULATORY_CARE_PROVIDER_SITE_OTHER): Payer: Medicare Other

## 2023-03-09 VITALS — BP 128/80 | HR 60 | Temp 98.2°F | Ht 69.0 in | Wt 213.0 lb

## 2023-03-09 DIAGNOSIS — E119 Type 2 diabetes mellitus without complications: Secondary | ICD-10-CM | POA: Diagnosis not present

## 2023-03-09 DIAGNOSIS — M545 Low back pain, unspecified: Secondary | ICD-10-CM | POA: Diagnosis not present

## 2023-03-09 DIAGNOSIS — E785 Hyperlipidemia, unspecified: Secondary | ICD-10-CM | POA: Diagnosis not present

## 2023-03-09 DIAGNOSIS — I5032 Chronic diastolic (congestive) heart failure: Secondary | ICD-10-CM

## 2023-03-09 DIAGNOSIS — Z7984 Long term (current) use of oral hypoglycemic drugs: Secondary | ICD-10-CM

## 2023-03-09 DIAGNOSIS — I10 Essential (primary) hypertension: Secondary | ICD-10-CM | POA: Diagnosis not present

## 2023-03-09 DIAGNOSIS — M5136 Other intervertebral disc degeneration, lumbar region: Secondary | ICD-10-CM | POA: Diagnosis not present

## 2023-03-09 LAB — HEPATIC FUNCTION PANEL
ALT: 26 U/L (ref 0–53)
AST: 20 U/L (ref 0–37)
Albumin: 4.2 g/dL (ref 3.5–5.2)
Alkaline Phosphatase: 73 U/L (ref 39–117)
Bilirubin, Direct: 0.2 mg/dL (ref 0.0–0.3)
Total Bilirubin: 0.7 mg/dL (ref 0.2–1.2)
Total Protein: 6.9 g/dL (ref 6.0–8.3)

## 2023-03-09 LAB — BASIC METABOLIC PANEL
BUN: 20 mg/dL (ref 6–23)
CO2: 27 mEq/L (ref 19–32)
Calcium: 9.5 mg/dL (ref 8.4–10.5)
Chloride: 107 mEq/L (ref 96–112)
Creatinine, Ser: 1.19 mg/dL (ref 0.40–1.50)
GFR: 57.94 mL/min — ABNORMAL LOW (ref >60.00)
Glucose, Bld: 94 mg/dL (ref 70–99)
Potassium: 4.3 mEq/L (ref 3.5–5.1)
Sodium: 142 mEq/L (ref 135–145)

## 2023-03-09 LAB — URINALYSIS, ROUTINE W REFLEX MICROSCOPIC
Bilirubin Urine: NEGATIVE
Ketones, ur: NEGATIVE
Leukocytes,Ua: NEGATIVE
Nitrite: NEGATIVE
Specific Gravity, Urine: >=1.03 — AB (ref 1.000–1.030)
Total Protein, Urine: NEGATIVE
Urine Glucose: NEGATIVE
Urobilinogen, UA: 2 — AB (ref 0.0–1.0)
pH: 6 (ref 5.0–8.0)

## 2023-03-09 LAB — CBC WITH DIFFERENTIAL/PLATELET
Basophils Absolute: 0.1 10*3/uL (ref 0.0–0.1)
Basophils Relative: 0.6 % (ref 0.0–3.0)
Eosinophils Absolute: 0.1 10*3/uL (ref 0.0–0.7)
Eosinophils Relative: 1.5 % (ref 0.0–5.0)
HCT: 44.8 % (ref 39.0–52.0)
Hemoglobin: 14.7 g/dL (ref 13.0–17.0)
Lymphocytes Relative: 22.3 % (ref 12.0–46.0)
Lymphs Abs: 1.8 10*3/uL (ref 0.7–4.0)
MCHC: 32.7 g/dL (ref 30.0–36.0)
MCV: 93.4 fl (ref 78.0–100.0)
Monocytes Absolute: 1.1 10*3/uL — ABNORMAL HIGH (ref 0.1–1.0)
Monocytes Relative: 14.1 % — ABNORMAL HIGH (ref 3.0–12.0)
Neutro Abs: 4.9 10*3/uL (ref 1.4–7.7)
Neutrophils Relative %: 61.5 % (ref 43.0–77.0)
Platelets: 157 10*3/uL (ref 150.0–400.0)
RBC: 4.79 Mil/uL (ref 4.22–5.81)
RDW: 15.4 % (ref 11.5–15.5)
WBC: 8 10*3/uL (ref 4.0–10.5)

## 2023-03-09 LAB — LIPID PANEL
Cholesterol: 117 mg/dL (ref 0–200)
HDL: 50.3 mg/dL (ref >39.00)
LDL Cholesterol: 56 mg/dL (ref 0–99)
NonHDL: 67.1
Total CHOL/HDL Ratio: 2
Triglycerides: 55 mg/dL (ref 0.0–149.0)
VLDL: 11 mg/dL (ref 0.0–40.0)

## 2023-03-09 LAB — HEMOGLOBIN A1C: Hgb A1c MFr Bld: 6.6 % — ABNORMAL HIGH (ref 4.6–6.5)

## 2023-03-09 LAB — TSH: TSH: 1.58 u[IU]/mL (ref 0.35–5.50)

## 2023-03-09 LAB — MICROALBUMIN / CREATININE URINE RATIO
Creatinine,U: 166.9 mg/dL
Microalb Creat Ratio: 1.1 mg/g (ref 0.0–30.0)
Microalb, Ur: 1.9 mg/dL (ref 0.0–1.9)

## 2023-03-09 NOTE — Assessment & Plan Note (Signed)
By hx is most consistent with significant spinal stenosis lumbar, unable for MRI due to PPM, has tramadol at home,, ok for otc lidocaine patch prn, tylenol prn, for lumbar plain films today and refer ortho

## 2023-03-09 NOTE — Patient Instructions (Addendum)
Ok to consider taking the OTC lidocaine patch as needed for pain, as well as tylenol as needed  Please continue all other medications as before, and call for refill on the tramadol if needed  Please have the pharmacy call with any other refills you may need.  Please continue your efforts at being more active, low cholesterol diet, and weight control.  Please keep your appointments with your specialists as you may have planned  You will be contacted regarding the referral for: orthopedic  Please go to the XRAY Department in the first floor for the x-ray testing  Please go to the LAB at the blood drawing area for the tests to be done  You will be contacted by phone if any changes need to be made immediately.  Otherwise, you will receive a letter about your results with an explanation, but please check with MyChart first.  Please remember to sign up for MyChart if you have not done so, as this will be important to you in the future with finding out test results, communicating by private email, and scheduling acute appointments online when needed.  Please make an Appointment to return in 4 months, or sooner if needed

## 2023-03-09 NOTE — Progress Notes (Signed)
The test results show that your current treatment is OK, as the tests are stable.  Please continue the same plan.  There is no other need for change of treatment or further evaluation based on these results, at this time.  thanks 

## 2023-03-09 NOTE — Progress Notes (Signed)
Patient ID: [name], male   DOB: 03/03/1941, 82 y.o.   MRN: [id number]        Chief Complaint: follow up low back pain, dm, htn, hld       HPI:  [name] is a 82 y.o. male here with c/o right > lett lower back pain with radiation to the bilateral hip areas, mod to severe at times, worse to stand and walk, better to sit, can only walk room to room at home, gets fearful with shopping at the store that he might not make it all the way and does not want the scooter; has little to no pain with sleeping and lying flat.  Pt denies chest pain, increased sob or doe, wheezing, orthopnea, PND, increased LE swelling, palpitations, dizziness or syncope.  Pt denies polydipsia, polyuria, or new focal neuro s/s.    Pt denies fever, wt loss, night sweats, loss of appetite, or other constitutional symptoms          Wt Readings from Last 3 Encounters:  03/12/23 213 lb (96.6 kg)  03/09/23 213 lb (96.6 kg)  02/28/23 210 lb (95.3 kg)   BP Readings from Last 3 Encounters:  03/12/23 131/88  03/09/23 128/80  01/14/23 123/72         Past Medical History:  Diagnosis Date   Abdominal pain, epigastric    Allergic rhinitis, cause unspecified    Arthritis    BPH (benign prostatic hyperplasia)    CHF (congestive heart failure) (HCC)    Chronic anticoagulation    xarelto   Cognitive communication deficit 09/29/2022   Coronary artery calcification seen on CAT scan 12/26/2012   Depressive disorder, not elsewhere classified    Diabetes mellitus treated with oral medication (HCC) 09/28/2022   Diverticulosis of colon (without mention of hemorrhage)    Esophageal reflux    High risk medication use    Hyperlipidemia    Hyperplastic rectal polyp    Hypertension    Lumbago    NAUSEA AND VOMITING 05/20/2010   Pacemaker    Paralyzed hemidiaphragm    LEFT   Persistent atrial fibrillation (HCC)    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) 09/29/2022   WHO class III pulmonary hypertension due to chronic lung  disease especially given the patient's history of COPD and 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   Sick sinus syndrome (HCC) 05/16/2009   s/p PPM ([employer]) by [facility]   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) 09/28/2022   Past Surgical History:  Procedure Laterality Date   CYSTOSCOPY WITH RETROGRADE PYELOGRAM, URETEROSCOPY AND STENT PLACEMENT Left 12/30/2021   Procedure: CYSTOSCOPY WITH RETROGRADE PYELOGRAM, URETEROSCOPY;  Surgeon: Belva Agee, MD;  Location: [facility];  Service: Urology;  Laterality: Left;   ENDARTERECTOMY Right 10/01/2022   Procedure: ENDARTERECTOMY CAROTID;  Surgeon: Leonie Douglas, MD;  Location: [facility] OR;  Service: Vascular;  Laterality: Right;   ESOPHAGOGASTRODUODENOSCOPY N/A 12/05/2013   Procedure: ESOPHAGOGASTRODUODENOSCOPY (EGD);  Surgeon: Hart Carwin, MD;  Location: [facility];  Service: Endoscopy;  Laterality: N/A;   ESOPHAGOGASTRODUODENOSCOPY N/A 07/04/2015   Procedure: ESOPHAGOGASTRODUODENOSCOPY (EGD);  Surgeon: Louis Meckel, MD;  Location: [facility] ENDOSCOPY;  Service: Endoscopy;  Laterality: N/A;   EXTRACORPOREAL SHOCK WAVE LITHOTRIPSY Left 10/13/2021   Procedure: EXTRACORPOREAL SHOCK WAVE LITHOTRIPSY (ESWL);  Surgeon: Crista Elliot, MD;  Location: [facility];  Service: Urology;  Laterality: Left;  90 MINS   INSERT / REPLACE /  REMOVE PACEMAKER  05/2009    [employer] ALTRUA 60  (serial number [id number]) dual-chamber pacemaker   LEAD REVISION/REPAIR N/A 10/15/2020   Procedure: LEAD REVISION/REPAIR;  Surgeon: Hillis Range, MD;  Location: [facility] INVASIVE CV LAB;  Service: Cardiovascular;  Laterality: N/A;   PACEMAKER PLACEMENT Bilateral    '05   PATCH ANGIOPLASTY Right 10/01/2022   Procedure: RIGHT CAROTID ARTERY PATCH ANGIOPLASTY WITH 1CM x 6CM XENOSURE BIOLOGIC PATCH;  Surgeon: Leonie Douglas, MD;  Location: [facility] OR;  Service: Vascular;  Laterality: Right;   PPM GENERATOR CHANGEOUT N/A  10/15/2020   Procedure: PPM GENERATOR CHANGEOUT;  Surgeon: Hillis Range, MD;  Location: [facility] INVASIVE CV LAB;  Service: Cardiovascular;  Laterality: N/A;   RIGHT HEART CATH N/A 09/09/2022   Procedure: RIGHT HEART CATH;  Surgeon: Orbie Pyo, MD;  Location: [facility] INVASIVE CV LAB;  Service: Cardiovascular;  Laterality: N/A;    reports that he quit smoking about 31 years ago. His smoking use included cigarettes. He has a 10.00 pack-year smoking history. He has quit using smokeless tobacco.  His smokeless tobacco use included chew. He reports that he does not drink alcohol and does not use drugs. family history includes Arthritis in his paternal grandmother; Asthma in his sister; Cancer in his father; Diabetes in his mother; Hypertension in his brother, daughter, father, mother, sister, and son; Lung cancer in his father. No Known Allergies Current Outpatient Medications on File Prior to Visit  Medication Sig Dispense Refill   acetaminophen (TYLENOL) 650 MG CR tablet Take 1,300 mg by mouth in the morning and at bedtime.     albuterol (VENTOLIN HFA) 108 (90 Base) MCG/ACT inhaler Inhale 2 puffs into the lungs every 6 (six) hours as needed for wheezing or shortness of breath. 8 g 6   amLODipine (NORVASC) 5 MG tablet Take 5 mg by mouth daily.     atorvastatin (LIPITOR) 80 MG tablet TAKE 1 TABLET BY MOUTH EVERY DAY 90 tablet 3   Budeson-Glycopyrrol-Formoterol (BREZTRI AEROSPHERE) 160-9-4.8 MCG/ACT AERO 1 puffs twice per day 10.7 g 11   Cholecalciferol (VITAMIN D3) 50 MCG (2000 UT) TABS Take 2,000 Units by mouth daily.     dorzolamide-timolol (COSOPT) 22.3-6.8 MG/ML ophthalmic solution Place 1 drop into both eyes in the morning and at bedtime.     ezetimibe (ZETIA) 10 MG tablet Take 1 tablet (10 mg total) by mouth daily. 90 tablet 3   fexofenadine (ALLEGRA) 180 MG tablet TAKE 1 TABLET (180 MG TOTAL) BY MOUTH DAILY. 90 tablet 1   flecainide (TAMBOCOR) 100 MG tablet TAKE 1 TABLET BY MOUTH TWICE A DAY 180  tablet 3   furosemide (LASIX) 40 MG tablet Take 40 mg by mouth as needed.     glipiZIDE (GLUCOTROL XL) 2.5 MG 24 hr tablet Take 1 tablet (2.5 mg total) by mouth daily with breakfast. 90 tablet 3   latanoprost (XALATAN) 0.005 % ophthalmic solution Place 1 drop into both eyes at bedtime.      meclizine (ANTIVERT) 12.5 MG tablet TAKE 1 TABLET BY MOUTH EVERY 8 HOURS AS NEEDED 30 tablet 5   metoprolol succinate (TOPROL XL) 25 MG 24 hr tablet Take 1 tablet (25 mg total) by mouth at bedtime. 90 tablet 2   nystatin (MYCOSTATIN) 100000 UNIT/ML suspension Take by mouth.     omeprazole (PRILOSEC) 40 MG capsule TAKE 1 CAPSULE BY MOUTH EVERY DAY AS NEEDED 90 capsule 2   psyllium (METAMUCIL) 0.52 g capsule Take 2 capsules by mouth daily.  tamsulosin (FLOMAX) 0.4 MG CAPS capsule TAKE 1 CAPSULE BY MOUTH TWICE A DAY 180 capsule 3   telmisartan (MICARDIS) 20 MG tablet Take 1 tablet (20 mg total) by mouth at bedtime. Resume from 10/06/22     traMADol (ULTRAM) 50 MG tablet TAKE 1 TABLET BY MOUTH EVERY 6 HOURS AS NEEDED FOR PAIN 120 tablet 2   XARELTO 20 MG TABS tablet TAKE 1 TABLET BY MOUTH DAILY WITH SUPPER 90 tablet 3   No current facility-administered medications on file prior to visit.        ROS:  All others reviewed and negative.  Objective        PE:  BP 128/80 (BP Location: Left Arm, Patient Position: Sitting, Cuff Size: Normal)   Pulse 60   Temp 98.2 F (36.8 C) (Oral)   Ht 5\' 9"  (1.753 m)   Wt 213 lb (96.6 kg)   SpO2 97%   BMI 31.45 kg/m                 Constitutional: Pt appears in NAD               HENT: Head: NCAT.                Right Ear: External ear normal.                 Left Ear: External ear normal.                Eyes: . Pupils are equal, round, and reactive to light. Conjunctivae and EOM are normal               Nose: without d/c or deformity               Neck: Neck supple. Gross normal ROM               Cardiovascular: Normal rate and regular rhythm.                  Pulmonary/Chest: Effort normal and breath sounds without rales or wheezing.                Abd:  Soft, NT, ND, + BS, no organomegaly               Neurological: Pt is alert. At baseline orientation, motor grossly intact, spine nontender in midline               Skin: Skin is warm. No rashes, no other new lesions, LE edema - none               Psychiatric: Pt behavior is normal without agitation   Micro: none  Cardiac tracings I have personally interpreted today:  none  Pertinent Radiological findings (summarize): none   Lab Results  Component Value Date   WBC 8.0 03/09/2023   HGB 14.7 03/09/2023   HCT 44.8 03/09/2023   PLT 157.0 03/09/2023   GLUCOSE 94 03/09/2023   CHOL 117 03/09/2023   TRIG 55.0 03/09/2023   HDL 50.30 03/09/2023   LDLDIRECT 183.4 12/26/2013   LDLCALC 56 03/09/2023   ALT 26 03/09/2023   AST 20 03/09/2023   NA 142 03/09/2023   K 4.3 03/09/2023   CL 107 03/09/2023   CREATININE 1.19 03/09/2023   BUN 20 03/09/2023   CO2 27 03/09/2023   TSH 1.58 03/09/2023   PSA 2.01 11/24/2021   INR 2.8 (H) 09/26/2022   HGBA1C 6.6 (H) 03/09/2023  MICROALBUR 1.9 03/09/2023   Assessment/Plan:  [name] is a 82 y.o. Black or African American [2] male with  has a past medical history of Abdominal pain, epigastric, Allergic rhinitis, cause unspecified, Arthritis, BPH (benign prostatic hyperplasia), CHF (congestive heart failure) (HCC), Chronic anticoagulation, Cognitive communication deficit (09/29/2022), Coronary artery calcification seen on CAT scan (12/26/2012), Depressive disorder, not elsewhere classified, Diabetes mellitus treated with oral medication (HCC) (09/28/2022), Diverticulosis of colon (without mention of hemorrhage), Esophageal reflux, High risk medication use, Hyperlipidemia, Hyperplastic rectal polyp, Hypertension, Lumbago, NAUSEA AND VOMITING (05/20/2010), Pacemaker, Paralyzed hemidiaphragm, Persistent atrial fibrillation (HCC), Pulmonary hypertension WHO class  IIII (HCC) (09/29/2022), Sick sinus syndrome (HCC) (05/16/2009), and 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  Lumbar back pain By hx is most consistent with significant spinal stenosis lumbar, unable for MRI due to PPM, has tramadol at home,, ok for otc lidocaine patch prn, tylenol prn, for lumbar plain films today and refer ortho  Diabetes mellitus treated with oral medication (HCC) Lab Results  Component Value Date   HGBA1C 6.6 (H) 03/09/2023   Stable, pt to continue current medical treatment glucotrol xl 2.5 mg qd   Essential hypertension BP Readings from Last 3 Encounters:  03/12/23 131/88  03/09/23 128/80  01/14/23 123/72   Stable, pt to continue medical treatment norvasc 5 mg qd, toprolx l 25 qd, micardis 20 qd   Hyperlipidemia Lab Results  Component Value Date   LDLCALC 56 03/09/2023   Stable, pt to continue current statin zetia 10 mg qd, lipitor 80 qd  Followup: Return in about 4 months (around 07/09/2023).  Oliver Barre, MD 03/12/2023 4:15 PM [facility]

## 2023-03-12 ENCOUNTER — Telehealth: Payer: Self-pay | Admitting: Pulmonary Disease

## 2023-03-12 ENCOUNTER — Encounter: Payer: Self-pay | Admitting: Orthopedic Surgery

## 2023-03-12 ENCOUNTER — Ambulatory Visit (INDEPENDENT_AMBULATORY_CARE_PROVIDER_SITE_OTHER): Payer: Medicare Other | Admitting: Orthopedic Surgery

## 2023-03-12 ENCOUNTER — Other Ambulatory Visit (INDEPENDENT_AMBULATORY_CARE_PROVIDER_SITE_OTHER): Payer: Medicare Other

## 2023-03-12 ENCOUNTER — Encounter: Payer: Self-pay | Admitting: Internal Medicine

## 2023-03-12 VITALS — BP 131/88 | HR 69 | Ht 69.0 in | Wt 213.0 lb

## 2023-03-12 DIAGNOSIS — R29818 Other symptoms and signs involving the nervous system: Secondary | ICD-10-CM

## 2023-03-12 DIAGNOSIS — M545 Low back pain, unspecified: Secondary | ICD-10-CM

## 2023-03-12 DIAGNOSIS — G4733 Obstructive sleep apnea (adult) (pediatric): Secondary | ICD-10-CM | POA: Diagnosis not present

## 2023-03-12 MED ORDER — PREGABALIN 75 MG PO CAPS: 75.0000 mg | ORAL_CAPSULE | Freq: Two times a day (BID) | ORAL | 0 refills | Status: AC

## 2023-03-12 NOTE — Assessment & Plan Note (Signed)
BP Readings from Last 3 Encounters:  03/12/23 131/88  03/09/23 128/80  01/14/23 123/72   Stable, pt to continue medical treatment norvasc 5 mg qd, toprolx l 25 qd, micardis 20 qd

## 2023-03-12 NOTE — Assessment & Plan Note (Addendum)
Stable volume, ok for contd current med tx lasix 40 mg qd prn

## 2023-03-12 NOTE — Assessment & Plan Note (Signed)
Lab Results  Component Value Date   HGBA1C 6.6 (H) 03/09/2023   Stable, pt to continue current medical treatment glucotrol xl 2.5 mg qd

## 2023-03-12 NOTE — Assessment & Plan Note (Signed)
Lab Results  Component Value Date   LDLCALC 56 03/09/2023   Stable, pt to continue current statin zetia 10 mg qd, lipitor 80 qd

## 2023-03-12 NOTE — Telephone Encounter (Signed)
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  Encourage weight loss measures  Follow-up in the office 4 to 6 weeks following initiation of treatment  Schedule for follow-up appointment prior to initiation of CPAP therapy, for evaluation of severe insomnia

## 2023-03-12 NOTE — Procedures (Signed)
POLYSOMNOGRAPHY  Last, First: [name], [name] MRN: [id number] Gender: Male Age (years): 51 Weight (lbs): 210 DOB: 17-Aug-1941 BMI: 31 Primary Care: No PCP Epworth Score: <br> Referring: Tomma Lightning MD Technician: Cherylann Parr Interpreting: Tomma Lightning MD Study Type: CPAP Ordered Study Type: CPAP Study date: 02/28/2023 Location: [place] CLINICAL INFORMATION [name] is a 82 year old Male and was referred to the sleep center for evaluation of G47.33 OSA: Adult and Pediatric (327.23). Indications include Congestive Heart Failure, COPD, Excessive Daytime Sleepiness, Hypertension, Obesity, Witnessed Apneas, Witnesses Apnea / Gasping During Sleep.   Most recent polysomnogram was on 01/26/2023. MEDICATIONS Patient self administered medications include: METOPROLOL SUCCINATE, MICARDIS, TYLENOL ARTHRITIS, XARELTO, ZETIA. Medications administered during study include No sleep medicine administered.  SLEEP STUDY TECHNIQUE The patient underwent an attended overnight polysomnography titration to assess the effects of CPAP therapy. The following variables were monitored: EEG(C4-A1, C3-A2, O1-A2, O2-A1), EOG, submental and leg EMG, ECG, oxyhemoglobin saturation by pulse oximetry, thoracic and abdominal respiratory effort belts, nasal/oral airflow by pressure sensor, body position sensor and snoring sensor. CPAP pressure was titrated to eliminate apneas, hypopneas and oxygen desaturation. Hypopneas were scored per AASM definition IB (4% desaturation)  TECHNICIAN COMMENTS Comments added by Technician: Patient had difficulty initiating sleep. Comments added by Scorer: N/A SLEEP ARCHITECTURE The study was initiated at 10:55:06 PM and terminated at 4:55:53 AM. Total recorded time was 360.8 minutes. EEG confirmed total sleep time was 120 minutes yielding a sleep efficiency of 33.3%. Sleep onset after lights out was 41.2 minutes with a REM latency of 140.0 minutes. The patient spent 7.5% of the  night in stage N1 sleep, 84.6% in stage N2 sleep, 0.0% in stage N3 and 7.9% in REM. The Arousal Index was 1.0/hour. RESPIRATORY PARAMETERS The overall AHI was 11.0 per hour, and the RDI was 11.0 events/hour with a central apnea index of 0per hour. The most appropriate setting of CPAP was 9 cm H2O. At this setting, the sleep efficiency was % and the patient was supine for 0%. The AHI was N/A events per hour, and the RDI was N/A events/hour (with 0 central events) and the arousal index was N/A per hour.The oxygen nadir was 83.0% during sleep.    The cumulative time under 88% oxygen saturation was 5.5 minutes  LEG MOVEMENT DATA The total leg movements were 0 with a resulting leg movement index of 0.0/hr. Associated arousal with leg movement index was 0.0/hr. CARDIAC DATA The underlying cardiac rhythm was most consistent with pacemaker generated. Mean heart rate during sleep was 70.1 bpm. Additional rhythm abnormalities include None.  IMPRESSIONS - EKG showed no cardiac abnormalities. - No snoring was audible during this study. - Mild Oxygen Desaturation - Severe Obstructive Sleep apnea(OSA) Optimal pressure attained. - Sleep onset and sleep maintenance insomnia - No significant periodic leg movements(PLMs) during sleep. However, no significant associated arousals. Loraine Leriche reduced sleep efficiency, long primary sleep latency, long REM sleep latency and long slow wave latency.  DIAGNOSIS - Severe Obstructive Sleep Apnea (G47.33) - Sleep onset and sleep maintenance insomnia  RECOMMENDATIONS - Trial of CPAP therapy on 9 cm H2O, Cflex 1 with a Large size Fisher&Paykel Nasal Eson mask and heated humidification. - Further evaluation and management of insomnia - Avoid alcohol, sedatives and other CNS depressants that may worsen sleep apnea and disrupt normal sleep architecture. - Sleep hygiene should be reviewed to assess factors that may improve sleep quality. - Weight management and regular  exercise should be initiated or continued. - Return  to Sleep Center for re-evaluation after 4 weeks of therapy  [Electronically signed] 03/12/2023 04:41 AM  Virl Diamond MD NPI: [id number]

## 2023-03-12 NOTE — Progress Notes (Signed)
Orthopedic Spine Surgery Office Note  Assessment: Patient is a 82 y.o. male with low back pain that radiates into the bilateral buttocks when standing or walking.  Pain improves when sitting. 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 Discussed that the conservative treatments include:  -activity modification  -physical therapy  -over the counter pain medications  -medrol dosepak  -lumbar steroid injections -Patient has tried lidocaine patches, Tylenol, tramadol -Since patient has tried over 6 weeks of conservative treatment without any significant relief, recommended MRI of the lumbar spine to evaluate for neurogenic claudication.  Patient has a pacemaker that is not MRI compatible, so ordered a CT myelogram instead -Prescribed Lyrica to try for additional pain relief -Patient should return to office in 4 weeks, x-rays at next visit: None   Patient expressed understanding of the plan and all questions were answered to the patient's satisfaction.   ___________________________________________________________________________   History:  Patient is a 82 y.o. male who presents today for lumbar spine.  Patient has had about 3 months of pain that starts in his low back and radiates into the bilateral buttocks.  Pain is worse on the right side.  Pain does not radiate past the proximal thigh on either side.  He notices that the pain is worse if he is standing or walking.  He states that he can do either activity for about a minute before the pain starts.  If he sits down or flexes the lumbar spine, his pain improves quickly.  He has not had pain like this before.  He does not have any pain if he sitting down.   Weakness: Yes, feels his lower back is weaker.  No other weakness noted Symptoms of imbalance: Sometimes feels off balance  but no consistent unsteadiness or imbalance Paresthesias and numbness: Denies Bowel or bladder incontinence: Denies Saddle anesthesia: Denies  Treatments tried: Lidocaine patches, Tylenol, tramadol  Review of systems: Denies fevers and chills, night sweats, unexplained weight loss, history of cancer, pain that wakes them at night  Past medical history: Allergic rhinitis BPH CHF DM (last A1C was 6.6 on 03/09/2023) HLD GERD HTN Atrial fibrillation on xarelto Pulmonary HTN Sick sinus syndrome History of stroke  Allergies: NKDA  Past surgical history:  Ureter stent placement Endarterectomy Pacemaker placement  Social history: Denies use of nicotine product (smoking, vaping, patches, smokeless) Alcohol use: denies Denies recreational drug use   Physical Exam:  BMI of 31.5  General: no acute distress, appears stated age Neurologic: alert, answering questions appropriately, following commands Respiratory: unlabored breathing on room air, symmetric chest rise Psychiatric: appropriate affect, normal cadence to speech   MSK (spine):  -Strength exam      Left  Right EHL    5/5  5/5 TA    5/5  5/5 GSC    5/5  5/5 Knee extension  5/5  5/5 Hip flexion   5/5  5/5  -Sensory exam    Sensation intact to light touch in L3-S1 nerve distributions of bilateral lower extremities  -Achilles DTR: 2/4 on the left, 2/4 on the right -Patellar tendon DTR: 2/4 on the left, 2/4 on the right  -Straight leg raise: Negative bilaterally -Femoral nerve stretch test: Negative bilaterally -Clonus: no beats bilaterally -Palpable DP pulses bilaterally  -Left hip exam: No pain through range of motion, negative Stinchfield, negative FABER -Right hip exam: No pain through range  of motion, negative Stinchfield, negative FABER  Imaging: XR of the lumbar spine from 03/09/2023 and 03/12/2023 was independently reviewed and interpreted, showing disc height loss at L4/5 and L5/S1. No fracture or  dislocation seen. No evidence of instability on flexion/extension views.    Patient name: [name] Patient MRN: [id number] Date of visit: 03/12/23

## 2023-03-26 ENCOUNTER — Ambulatory Visit: Payer: Self-pay

## 2023-03-26 NOTE — Telephone Encounter (Signed)
Spoke with patient regarding sleep study   Sleep study result   Date of study: 02/28/2023   Impression: Severe obstructive sleep apnea Severe sleep onset and sleep maintenance insomnia   Recommendation:     DME referral   Trial of CPAP therapy on 9 cm H2O, Cflex 1 with a Large size Fisher&Paykel Nasal Eson mask and heated humidification.   Encourage weight loss measures   Follow-up in the office 4 to 6 weeks following initiation of treatment   Schedule for follow-up appointment prior to initiation of CPAP therapy, for evaluation of severe insomnia          Made a f/u for patient to come see Dr.Olalere 04/02/23.Patient's voice was understanding. Nothing else further needed.

## 2023-03-26 NOTE — Patient Instructions (Signed)
Visit Information  Thank you for taking time to visit with me today. Please don't hesitate to contact me if I can be of assistance to you.   Following are the goals we discussed today:  Continue to take medications as prescribed. Continue to attend provider visits as scheduled Continue to eat healthy, lean meats, vegetables, fruits, avoid saturated and transfats Notify provider if condition does not improve or worsens  Our next appointment is by telephone on 04/26/23 at 10:30 am  Please call the care guide team at [phone] if you need to cancel or reschedule your appointment.   If you are experiencing a Mental Health or Behavioral Health Crisis or need someone to talk to, please call the Suicide and Crisis Lifeline: [address]  Kathyrn Sheriff, RN, MSN, BSN, CCM [facility] Care Coordinator [phone]    Chronic Back Pain Chronic back pain is back pain that lasts longer than 3 months. The pain may get worse at certain times (flare-ups). There are things you can do at home to manage your pain. Follow these instructions at home: Watch for any changes in your symptoms. Take these actions to help with your pain: Managing pain and stiffness     If told, put ice on the painful area. You may be told to use ice for 24-48 hours after a flare-up starts. Put ice in a plastic bag. Place a towel between your skin and the bag. Leave the ice on for 20 minutes, 2-3 times a day. If told, put heat on the painful area. Do this as often as told by your doctor. Use the heat source that your doctor recommends, such as a moist heat pack or a heating pad. Place a towel between your skin and the heat source. Leave the heat on for 20-30 minutes. If your skin turns bright red, take off the ice or heat right away to prevent skin damage. The risk of damage is higher if you cannot feel pain, heat, or cold. Soak in a warm bath. This can help with pain. Activity        Avoid bending and other activities that make  the pain worse. When you stand: Keep your upper back and neck straight. Keep your shoulders pulled back. Avoid slouching. When you sit: Keep your back straight. Relax your shoulders. Do not round your shoulders or pull them backward. Do not sit or stand in one place for too long. Take short rest breaks during the day. Lying down or standing is often better than sitting. Resting can help relieve pain. When sitting or lying down for a long time, do some mild activity or stretching. This will help to prevent stiffness and pain. Get regular exercise. Ask your doctor what activities are safe for you. You may have to avoid lifting. Ask your provider how much you can safely lift. If you lift things: Bend your knees. Keep the weight close to your body. Avoid twisting. Medicines Take over-the-counter and prescription medicines only as told by your doctor. You may need to take medicines for pain and swelling. These may be taken by mouth or put on the skin. You may also be given muscle relaxants. Ask your doctor if the medicine prescribed to you: Requires you to avoid driving or using machinery. Can cause trouble pooping (constipation). You may need to take these actions to prevent or treat trouble pooping: Drink enough fluid to keep your pee (urine) pale yellow. Take over-the-counter or prescription medicines. Eat foods that are high in fiber. These include  beans, whole grains, and fresh fruits and vegetables. Limit foods that are high in fat and sugars. These include fried or sweet foods. General instructions  Sleep on a firm mattress. Try lying on your side with your knees slightly bent. If you lie on your back, put a pillow under your knees. Do not smoke or use any products that contain nicotine or tobacco. If you need help quitting, ask your doctor. Contact a doctor if: Your pain does not get better with rest or medicine. You have new pain. You have a fever. You lose weight quickly. You  have trouble doing your normal activities. One or both of your legs or feet feel weak. One or both of your legs or feet lose feeling (have numbness). Get help right away if: You are not able to control when you pee or poop. You have bad back pain and: You feel like you may vomit (nauseous). You vomit. You have pain in your chest or your belly (abdomen). You have shortness of breath. You faint. These symptoms may be an emergency. Get help right away. Call 911. Do not wait to see if the symptoms will go away. Do not drive yourself to the hospital. This information is not intended to replace advice given to you by your health care provider. Make sure you discuss any questions you have with your health care provider. Document Revised: 06/22/2022 Document Reviewed: 06/22/2022 Elsevier Patient Education  2023 [employer].

## 2023-03-26 NOTE — Patient Outreach (Signed)
  Care Coordination   Follow Up Visit Note   03/26/2023 Name: [name] MRN: [id number] DOB: [birth year]  [name] is a 82 y.o. year old male who sees Corwin Levins, MD for primary care. I spoke with  [name] by phone today.  What matters to the patients health and wellness today?  Primary concern today is low back pain-myelogram scheduled for 03/29/23. Follow up with orthopedic provider on 03/12/23 He reports is using Markus Daft, but has questions about if he is priming correctly. He states he will ask his pharmacist.  Goals Addressed             This Visit's Progress    Assist with health management       Interventions Today    Flowsheet Row Most Recent Value  Chronic Disease   Chronic disease during today's visit Other  [chronic lower back pain]  General Interventions   General Interventions Discussed/Reviewed General Interventions Reviewed  Doctor Visits Discussed/Reviewed Doctor Visits Reviewed  Education Interventions   Education Provided Provided Education, Provided Web-based Education  [back pain]  Provided Verbal Education On When to see the doctor, Medication, Other  [discussed heat, massage, physical therapy, acupuncture. Advised to discuss with orthopedic provider non pharmacologic options. Advised to continue to take medication as recommended and notify provider if condition does not improve or worsens.]  Pharmacy Interventions   Pharmacy Dicussed/Reviewed Pharmacy Topics Reviewed, Medications and their functions  [discussed Breztri, advised paitent to review priming Breztri inhaler with his pharmacist]            SDOH assessments and interventions completed:  No  Care Coordination Interventions:  Yes, provided   Follow up plan: Follow up call scheduled for 04/26/23    Encounter Outcome:  Pt. Visit Completed   Kathyrn Sheriff, RN, MSN, BSN, CCM [facility] Care Coordinator [phone]

## 2023-03-29 ENCOUNTER — Ambulatory Visit
Admission: RE | Admit: 2023-03-29 | Discharge: 2023-03-29 | Disposition: A | Discharge status: Home / Self Care | Payer: Medicare Other | Source: Ambulatory Visit | Attending: Orthopedic Surgery | Admitting: Orthopedic Surgery

## 2023-03-29 DIAGNOSIS — I700 Atherosclerosis of aorta: Secondary | ICD-10-CM | POA: Diagnosis not present

## 2023-03-29 DIAGNOSIS — R29818 Other symptoms and signs involving the nervous system: Secondary | ICD-10-CM

## 2023-03-29 DIAGNOSIS — M47816 Spondylosis without myelopathy or radiculopathy, lumbar region: Secondary | ICD-10-CM | POA: Diagnosis not present

## 2023-03-29 DIAGNOSIS — M5430 Sciatica, unspecified side: Secondary | ICD-10-CM | POA: Diagnosis not present

## 2023-03-29 DIAGNOSIS — M48061 Spinal stenosis, lumbar region without neurogenic claudication: Secondary | ICD-10-CM | POA: Diagnosis not present

## 2023-03-29 MED ORDER — DIAZEPAM 5 MG PO TABS: 5.0000 mg | ORAL_TABLET | Freq: Once | ORAL | Status: DC

## 2023-03-29 MED ORDER — IOPAMIDOL (ISOVUE-M 200) INJECTION 41%
20.0000 mL | Freq: Once | INTRAMUSCULAR | Status: AC
  Administered 2023-03-29: 20 mL via INTRATHECAL

## 2023-03-29 MED ORDER — MEPERIDINE HCL 50 MG/ML IJ SOLN: 50.0000 mg | Freq: Once | INTRAMUSCULAR | Status: DC | PRN

## 2023-03-29 MED ORDER — ONDANSETRON HCL 4 MG/2ML IJ SOLN: 4.0000 mg | Freq: Once | INTRAMUSCULAR | Status: DC | PRN

## 2023-03-29 NOTE — Discharge Instructions (Signed)

## 2023-04-02 ENCOUNTER — Ambulatory Visit: Payer: Medicare Other | Admitting: Pulmonary Disease

## 2023-04-06 ENCOUNTER — Encounter: Payer: Self-pay | Admitting: Internal Medicine

## 2023-04-06 ENCOUNTER — Ambulatory Visit (INDEPENDENT_AMBULATORY_CARE_PROVIDER_SITE_OTHER): Payer: Medicare Other | Admitting: Internal Medicine

## 2023-04-06 VITALS — BP 138/82 | HR 60 | Temp 98.5°F | Ht 69.0 in | Wt 213.0 lb

## 2023-04-06 DIAGNOSIS — I10 Essential (primary) hypertension: Secondary | ICD-10-CM

## 2023-04-06 DIAGNOSIS — I5032 Chronic diastolic (congestive) heart failure: Secondary | ICD-10-CM

## 2023-04-06 DIAGNOSIS — J449 Chronic obstructive pulmonary disease, unspecified: Secondary | ICD-10-CM | POA: Diagnosis not present

## 2023-04-06 DIAGNOSIS — M5416 Radiculopathy, lumbar region: Secondary | ICD-10-CM | POA: Insufficient documentation

## 2023-04-06 MED ORDER — GABAPENTIN 100 MG PO CAPS
100.0000 mg | ORAL_CAPSULE | Freq: Three times a day (TID) | ORAL | 3 refills | Status: DC
Start: 2023-04-06 — End: 2023-05-03

## 2023-04-06 NOTE — Assessment & Plan Note (Signed)
Stable, cont current med tx 

## 2023-04-06 NOTE — Assessment & Plan Note (Signed)
BP Readings from Last 3 Encounters:  04/06/23 138/82  03/29/23 122/70  03/12/23 131/88   Stable, pt to continue medical treatment norvasc 5 qd, toprol xl 25 qd, micardis 20 qd

## 2023-04-06 NOTE — Progress Notes (Signed)
Patient ID: [name], male   DOB: July 11, 1941, 82 y.o.   MRN: [id number]        Chief Complaint: follow up low back pain with recent CT myelogram, htn, diast chf,, copd       HPI:  [name] is a 82 y.o. male here with persistent back pain uncontrolled, had recent ct myelogram with 1. Multilevel lumbar spondylosis as described above. Moderate to severe left and moderate right neuroforaminal stenosis at L5-S1. 2. Moderate bilateral neuroforaminal stenosis L4-L5.  Pt denies chest pain, increased sob or doe, wheezing, orthopnea, PND, increased LE swelling, palpitations, dizziness or syncope.   Pt denies polydipsia, polyuria, or new focal neuro s/s.    Pt denies fever, wt loss, night sweats, loss of appetite, or other constitutional symptoms  Has f/u appt with Dr Christell Constant ortho for thur 5/23       Wt Readings from Last 3 Encounters:  04/06/23 213 lb (96.6 kg)  03/12/23 213 lb (96.6 kg)  03/09/23 213 lb (96.6 kg)   BP Readings from Last 3 Encounters:  04/06/23 138/82  03/29/23 122/70  03/12/23 131/88         Past Medical History:  Diagnosis Date   Abdominal pain, epigastric    Allergic rhinitis, cause unspecified    Arthritis    BPH (benign prostatic hyperplasia)    CHF (congestive heart failure) (HCC)    Chronic anticoagulation    xarelto   Cognitive communication deficit 09/29/2022   Coronary artery calcification seen on CAT scan 12/26/2012   Depressive disorder, not elsewhere classified    Diabetes mellitus treated with oral medication (HCC) 09/28/2022   Diverticulosis of colon (without mention of hemorrhage)    Esophageal reflux    High risk medication use    Hyperlipidemia    Hyperplastic rectal polyp    Hypertension    Lumbago    NAUSEA AND VOMITING 05/20/2010   Pacemaker    Paralyzed hemidiaphragm    LEFT   Persistent atrial fibrillation (HCC)    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) 09/29/2022   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  especially given the patient's history of COPD and 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   Sick sinus syndrome (HCC) 05/16/2009   s/p PPM ([employer]) by Fawn Kirk   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) 09/28/2022   Past Surgical History:  Procedure Laterality Date   CYSTOSCOPY WITH RETROGRADE PYELOGRAM, URETEROSCOPY AND STENT PLACEMENT Left 12/30/2021   Procedure: CYSTOSCOPY WITH RETROGRADE PYELOGRAM, URETEROSCOPY;  Surgeon: Belva Agee, MD;  Location: [facility];  Service: Urology;  Laterality: Left;   ENDARTERECTOMY Right 10/01/2022   Procedure: ENDARTERECTOMY CAROTID;  Surgeon: Leonie Douglas, MD;  Location: [facility] OR;  Service: Vascular;  Laterality: Right;   ESOPHAGOGASTRODUODENOSCOPY N/A 12/05/2013   Procedure: ESOPHAGOGASTRODUODENOSCOPY (EGD);  Surgeon: Hart Carwin, MD;  Location: [facility];  Service: Endoscopy;  Laterality: N/A;   ESOPHAGOGASTRODUODENOSCOPY N/A 07/04/2015   Procedure: ESOPHAGOGASTRODUODENOSCOPY (EGD);  Surgeon: Louis Meckel, MD;  Location: [facility] END[facility]COPY;  Service: Endoscopy;  Laterality: N/A;   EXTRACORPOREAL SHOCK WAVE LITHOTRIPSY Left 10/13/2021   Procedure: EXTRACORPOREAL SHOCK WAVE LITHOTRIPSY (ESWL);  Surgeon: Crista Elliot, MD;  Location: [facility];  Service: Urology;  Laterality: Left;  90 MINS   INSERT / REPLACE / REMOVE PACEMAKER  05/2009    [employer] ALTRUA 60  (serial number [id number]) dual-chamber pacemaker   LEAD  REVISION/REPAIR N/A 10/15/2020   Procedure: LEAD REVISION/REPAIR;  Surgeon: Hillis Range, MD;  Location: [facility] INVASIVE CV LAB;  Service: Cardiovascular;  Laterality: N/A;   PACEMAKER PLACEMENT Bilateral    '05   PATCH ANGIOPLASTY Right 10/01/2022   Procedure: RIGHT CAROTID ARTERY PATCH ANGIOPLASTY WITH 1CM x 6CM XENOSURE BIOLOGIC PATCH;  Surgeon: Leonie Douglas, MD;  Location: [facility] OR;  Service: Vascular;  Laterality: Right;   PPM GENERATOR CHANGEOUT N/A  10/15/2020   Procedure: PPM GENERATOR CHANGEOUT;  Surgeon: Hillis Range, MD;  Location: [facility] INVASIVE CV LAB;  Service: Cardiovascular;  Laterality: N/A;   RIGHT HEART CATH N/A 09/09/2022   Procedure: RIGHT HEART CATH;  Surgeon: Orbie Pyo, MD;  Location: [facility] INVASIVE CV LAB;  Service: Cardiovascular;  Laterality: N/A;    reports that he quit smoking about 31 years ago. His smoking use included cigarettes. He has a 10.00 pack-year smoking history. He has quit using smokeless tobacco.  His smokeless tobacco use included chew. He reports that he does not drink alcohol and does not use drugs. family history includes Arthritis in his paternal grandmother; Asthma in his sister; Cancer in his father; Diabetes in his mother; Hypertension in his brother, daughter, father, mother, sister, and son; Lung cancer in his father. No Known Allergies Current Outpatient Medications on File Prior to Visit  Medication Sig Dispense Refill   acetaminophen (TYLENOL) 650 MG CR tablet Take 1,300 mg by mouth in the morning and at bedtime.     albuterol (VENTOLIN HFA) 108 (90 Base) MCG/ACT inhaler Inhale 2 puffs into the lungs every 6 (six) hours as needed for wheezing or shortness of breath. 8 g 6   amLODipine (NORVASC) 5 MG tablet Take 5 mg by mouth daily.     atorvastatin (LIPITOR) 80 MG tablet TAKE 1 TABLET BY MOUTH EVERY DAY 90 tablet 3   Budeson-Glycopyrrol-Formoterol (BREZTRI AEROSPHERE) 160-9-4.8 MCG/ACT AERO 1 puffs twice per day 10.7 g 11   Cholecalciferol (VITAMIN D3) 50 MCG (2000 UT) TABS Take 2,000 Units by mouth daily.     dorzolamide-timolol (COSOPT) 22.3-6.8 MG/ML ophthalmic solution Place 1 drop into both eyes in the morning and at bedtime.     ezetimibe (ZETIA) 10 MG tablet Take 1 tablet (10 mg total) by mouth daily. 90 tablet 3   fexofenadine (ALLEGRA) 180 MG tablet TAKE 1 TABLET (180 MG TOTAL) BY MOUTH DAILY. 90 tablet 1   flecainide (TAMBOCOR) 100 MG tablet TAKE 1 TABLET BY MOUTH TWICE A DAY 180  tablet 3   furosemide (LASIX) 40 MG tablet Take 40 mg by mouth as needed.     glipiZIDE (GLUCOTROL XL) 2.5 MG 24 hr tablet Take 1 tablet (2.5 mg total) by mouth daily with breakfast. 90 tablet 3   latanoprost (XALATAN) 0.005 % ophthalmic solution Place 1 drop into both eyes at bedtime.      meclizine (ANTIVERT) 12.5 MG tablet TAKE 1 TABLET BY MOUTH EVERY 8 HOURS AS NEEDED 30 tablet 5   metoprolol succinate (TOPROL XL) 25 MG 24 hr tablet Take 1 tablet (25 mg total) by mouth at bedtime. 90 tablet 2   nystatin (MYCOSTATIN) 100000 UNIT/ML suspension Take by mouth.     omeprazole (PRILOSEC) 40 MG capsule TAKE 1 CAPSULE BY MOUTH EVERY DAY AS NEEDED 90 capsule 2   pregabalin (LYRICA) 75 MG capsule Take 1 capsule (75 mg total) by mouth 2 (two) times daily. 60 capsule 0   psyllium (METAMUCIL) 0.52 g capsule Take 2 capsules by mouth daily.  tamsulosin (FLOMAX) 0.4 MG CAPS capsule TAKE 1 CAPSULE BY MOUTH TWICE A DAY 180 capsule 3   telmisartan (MICARDIS) 20 MG tablet Take 1 tablet (20 mg total) by mouth at bedtime. Resume from 10/06/22     traMADol (ULTRAM) 50 MG tablet TAKE 1 TABLET BY MOUTH EVERY 6 HOURS AS NEEDED FOR PAIN 120 tablet 2   XARELTO 20 MG TABS tablet TAKE 1 TABLET BY MOUTH DAILY WITH SUPPER 90 tablet 3   No current facility-administered medications on file prior to visit.        ROS:  All others reviewed and negative.  Objective        PE:  BP 138/82 (BP Location: Right Arm, Patient Position: Sitting, Cuff Size: Normal)   Pulse 60   Temp 98.5 F (36.9 C) (Oral)   Ht 5\' 9"  (1.753 m)   Wt 213 lb (96.6 kg)   SpO2 97%   BMI 31.45 kg/m                 Constitutional: Pt appears in NAD               HENT: Head: NCAT.                Right Ear: External ear normal.                 Left Ear: External ear normal.                Eyes: . Pupils are equal, round, and reactive to light. Conjunctivae and EOM are normal               Nose: without d/c or deformity               Neck: Neck  supple. Gross normal ROM               Cardiovascular: Normal rate and regular rhythm.                 Pulmonary/Chest: Effort normal and breath sounds without rales or wheezing.                Abd:  Soft, NT, ND, + BS, no organomegaly               Neurological: Pt is alert. At baseline orientation, motor grossly intact               Skin: Skin is warm. No rashes, no other new lesions, LE edema - none               Psychiatric: Pt behavior is normal without agitation   Micro: none  Cardiac tracings I have personally interpreted today:  none  Pertinent Radiological findings (summarize): none   Lab Results  Component Value Date   WBC 8.0 03/09/2023   HGB 14.7 03/09/2023   HCT 44.8 03/09/2023   PLT 157.0 03/09/2023   GLUCOSE 94 03/09/2023   CHOL 117 03/09/2023   TRIG 55.0 03/09/2023   HDL 50.30 03/09/2023   LDLDIRECT 183.4 12/26/2013   LDLCALC 56 03/09/2023   ALT 26 03/09/2023   AST 20 03/09/2023   NA 142 03/09/2023   K 4.3 03/09/2023   CL 107 03/09/2023   CREATININE 1.19 03/09/2023   BUN 20 03/09/2023   CO2 27 03/09/2023   TSH 1.58 03/09/2023   PSA 2.01 11/24/2021   INR 2.8 (H) 09/26/2022   HGBA1C 6.6 (H) 03/09/2023   MICROALBUR 1.9 03/09/2023  Assessment/Plan:  [name] is a 82 y.o. Black or African American [2] male with  has a past medical history of Abdominal pain, epigastric, Allergic rhinitis, cause unspecified, Arthritis, BPH (benign prostatic hyperplasia), CHF (congestive heart failure) (HCC), Chronic anticoagulation, Cognitive communication deficit (09/29/2022), Coronary artery calcification seen on CAT scan (12/26/2012), Depressive disorder, not elsewhere classified, Diabetes mellitus treated with oral medication (HCC) (09/28/2022), Diverticulosis of colon (without mention of hemorrhage), Esophageal reflux, High risk medication use, Hyperlipidemia, Hyperplastic rectal polyp, Hypertension, Lumbago, NAUSEA AND VOMITING (05/20/2010), Pacemaker, Paralyzed  hemidiaphragm, Persistent atrial fibrillation (HCC), 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  Chronic diastolic CHF (congestive heart failure) (HCC) Stable, cont current med tx  COPD GOLD III with marked reversibility  Stable, cont inhaler prn  Essential hypertension BP Readings from Last 3 Encounters:  04/06/23 138/82  03/29/23 122/70  03/12/23 131/88   Stable, pt to continue medical treatment norvasc 5 qd, toprol xl 25 qd, micardis 20 qd   Lumbar radiculitis With persistent pain uncontrolled, ok to add gabapentin 100 tid, and f/u Dr Christell Constant may 23 as planned  Followup: Return in about 3 months (around 07/07/2023).  Oliver Barre, MD 04/06/2023 8:12 PM [facility]

## 2023-04-06 NOTE — Assessment & Plan Note (Signed)
With persistent pain uncontrolled, ok to add gabapentin 100 tid, and f/u Dr Christell Constant may 23 as planned

## 2023-04-06 NOTE — Patient Instructions (Addendum)
Please take all new medication as prescribed - the gabapentin 100 mg three times per day  Please continue all other medications as before, and refills have been done if requested.  Please have the pharmacy call with any other refills you may need.  Please continue your efforts at being more active, low cholesterol diet, and weight control.  Please keep your appointments with your specialists as you may have planned - Thur with Dr Christell Constant   Please make an Appointment to return in Aug 21, or sooner if needed, also with Lab Appointment for testing done 3-5 days before at the FIRST FLOOR Lab (so this is for TWO appointments - please see the scheduling desk as you leave)

## 2023-04-06 NOTE — Assessment & Plan Note (Signed)
Stable , cont inhaler prn 

## 2023-04-08 ENCOUNTER — Ambulatory Visit (INDEPENDENT_AMBULATORY_CARE_PROVIDER_SITE_OTHER): Payer: Medicare Other | Admitting: Orthopedic Surgery

## 2023-04-08 DIAGNOSIS — M5416 Radiculopathy, lumbar region: Secondary | ICD-10-CM

## 2023-04-08 NOTE — Progress Notes (Signed)
rthopedic Spine Surgery Office Note   Assessment: Patient is a 82 y.o. male with low back pain that radiates into the bilateral buttocks.  Has bilateral foraminal stenosis at L4/5 and L5/S1.     Plan: -Explained that initially conservative treatment is tried as a significant number of patients may experience relief with these treatment modalities. Discussed that the conservative treatments include:             -activity modification             -physical therapy             -over the counter pain medications             -medrol dosepak             -lumbar steroid injections -Patient has tried lidocaine patches, Tylenol, tramadol, lyrica -Recommended diagnostic/therapeutic injection to L5/S1 -Patient should return to office in 4 weeks, x-rays at next visit: None     Patient expressed understanding of the plan and all questions were answered to the patient's satisfaction.    ___________________________________________________________________________     History:   Patient is a 82 y.o. male who presents today for lumbar spine.  Patient comes in today for routine follow-up.  He is still having pain in his low back that radiates into the bilateral buttock.  It also goes into the proximal aspect of the posterior thighs.  He does not have any pain rating past the proximal aspect of the posterior thighs.  Pain is worse when he is walking or upright for a while.  It does get better if he sits down.  He denies paresthesias and numbness.  No significant changes in his symptoms since the last time he was seen.     Treatments tried: Lidocaine patches, Tylenol, tramadol, lyrica     Physical Exam:   BMI of 31.5   General: no acute distress, appears stated age Neurologic: alert, answering questions appropriately, following commands Respiratory: unlabored breathing on room air, symmetric chest rise Psychiatric: appropriate affect, normal cadence to speech     MSK (spine):   -Strength exam                                                    Left                  Right EHL                              5/5                  5/5 TA                                 5/5                  5/5 GSC                             5/5                  5/5 Knee extension            5/5  5/5 Hip flexion                    5/5                  5/5   -Sensory exam                           Sensation intact to light touch in L3-S1 nerve distributions of bilateral lower extremities   -Achilles DTR: 2/4 on the left, 2/4 on the right -Patellar tendon DTR: 2/4 on the left, 2/4 on the right   -Straight leg raise: Negative bilaterally -Femoral nerve stretch test: Negative bilaterally -Clonus: no beats bilaterally -Palpable DP pulses bilaterally   Imaging: XR of the lumbar spine from 03/09/2023 and 03/12/2023 was previously independently reviewed and interpreted, showing disc height loss at L4/5 and L5/S1. No fracture or dislocation seen. No evidence of instability on flexion/extension views.    CT myelogram 03/29/2023 was independently reviewed and interpreted, showing bilateral foraminal stenosis at L4/5 and L5/S1.  Mild spinal stenosis at L2/3 and L3/4.   Patient name: [name] Patient MRN: [id number] Date of visit: 04/08/23

## 2023-04-14 ENCOUNTER — Ambulatory Visit (INDEPENDENT_AMBULATORY_CARE_PROVIDER_SITE_OTHER): Payer: Medicare Other | Admitting: Primary Care

## 2023-04-14 ENCOUNTER — Encounter: Payer: Self-pay | Admitting: Primary Care

## 2023-04-14 VITALS — BP 112/78 | HR 61 | Temp 97.4°F | Ht 69.0 in | Wt 218.4 lb

## 2023-04-14 DIAGNOSIS — G4733 Obstructive sleep apnea (adult) (pediatric): Secondary | ICD-10-CM

## 2023-04-14 NOTE — Addendum Note (Signed)
Addended by: Lanna Poche on: 04/14/2023 10:22 AM   Modules accepted: Orders

## 2023-04-14 NOTE — Progress Notes (Signed)
@Patient  ID: [name], male    DOB: [birth year], 82 y.o.   MRN: [id number]  No chief complaint on file.   Referring provider: Corwin Levins, MD  HPI: 82 year old male, former smoker quit 1993 (10-pack-year history).  Past medical history significant for A-fib, chronic diastolic heart failure, hypertension, stroke, COPD Gold 3, OSA, chronic pain, obesity. Patient of Dr. Wynona Neat.   04/14/2023 Patient presents today to review sleep study results.  Patient was seen in February 2024 by Dr. Wynona Neat and noted to have fatigue along with daytime sleepiness.  Epworth score 10.  He was ordered for split-night sleep study on 01/27/23 showed evidence of severe OSA, AHI 58.9/hour. He underwent CPAP titration study on 03/01/23, apneas were corrected with pressure 9cm h20.  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  Patient tells me that he has no trouble falling asleep.  He does wake up 3-4 times a night and will let times have difficulty fallings back to sleep.  We reviewed sleep study results, recommending patient be started on CPAP due to severity of OSA.  Patient in agreement with plan.  His wife wear CPAP and will be able to help him.  No Known Allergies  Immunization History  Administered Date(s) Administered   Fluad Quad(high Dose 65+) 07/16/2019, 08/08/2021, 08/10/2022   Influenza Split 08/16/2012, 08/17/2012, 08/16/2013   Influenza Whole 08/16/2010, 08/17/2011   Influenza, High Dose Seasonal PF 08/26/2014, 07/18/2017, 07/18/2018   Influenza,inj,Quad PF,6+ Mos 07/14/2019   Influenza-Unspecified 08/11/2015, 07/17/2016, 06/21/2017, 05/25/2018, 08/12/2020   Moderna Covid-19 Vaccine Bivalent Booster 47yrs & up 08/18/2021   Moderna Sars-Covid-2 Vaccination 11/16/2018, 11/16/2018   PFIZER Comirnaty(Gray Top)Covid-19 Tri-Sucrose Vaccine 03/09/2021   PFIZER(Purple Top)SARS-COV-2 Vaccination 12/18/2019, 01/08/2020, 08/19/2020   PNEUMOCOCCAL CONJUGATE-20 03/09/2021    Pneumococcal Conjugate-13 09/26/2013   Pneumococcal Polysaccharide-23 10/25/2006, 09/16/2010   Td 05/27/2009   Tdap 11/07/2019   Unspecified SARS-COV-2 Vaccination 08/27/2022   Zoster Recombinat (Shingrix) 12/19/2018, 05/27/2019   Zoster, Live 10/25/2006    Past Medical History:  Diagnosis Date   Abdominal pain, epigastric    Allergic rhinitis, cause unspecified    Arthritis    BPH (benign prostatic hyperplasia)    CHF (congestive heart failure) (HCC)    Chronic anticoagulation    xarelto   Cognitive communication deficit 09/29/2022   Coronary artery calcification seen on CAT scan 12/26/2012   Depressive disorder, not elsewhere classified    Diabetes mellitus treated with oral medication (HCC) 09/28/2022   Diverticulosis of colon (without mention of hemorrhage)    Esophageal reflux    High risk medication use    Hyperlipidemia    Hyperplastic rectal polyp    Hypertension    Lumbago    NAUSEA AND VOMITING 05/20/2010   Pacemaker    Paralyzed hemidiaphragm    LEFT   Persistent atrial fibrillation (HCC)    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) 09/29/2022   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 especially given the patient's history of COPD and 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   Sick sinus syndrome (HCC) 05/16/2009   s/p PPM ([employer]) by [employer]   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) 09/28/2022    Tobacco History: Social History   Tobacco Use  Smoking Status Former   Packs/day: 0.50   Years: 20.00   Additional pack years: 0.00   Total pack years: 10.00   Types: Cigarettes   Quit date: 11/17/1991  Years since quitting: 31.4  Smokeless Tobacco Former   Types: Chew   Counseling given: Not Answered   Outpatient Medications Prior to Visit  Medication Sig Dispense Refill   acetaminophen (TYLENOL) 650 MG CR tablet Take 1,300 mg by mouth in the morning and at bedtime.     albuterol (VENTOLIN  HFA) 108 (90 Base) MCG/ACT inhaler Inhale 2 puffs into the lungs every 6 (six) hours as needed for wheezing or shortness of breath. 8 g 6   amLODipine (NORVASC) 5 MG tablet Take 5 mg by mouth daily.     atorvastatin (LIPITOR) 80 MG tablet TAKE 1 TABLET BY MOUTH EVERY DAY 90 tablet 3   Budeson-Glycopyrrol-Formoterol (BREZTRI AEROSPHERE) 160-9-4.8 MCG/ACT AERO 1 puffs twice per day 10.7 g 11   Cholecalciferol (VITAMIN D3) 50 MCG (2000 UT) TABS Take 2,000 Units by mouth daily.     dorzolamide-timolol (COSOPT) 22.3-6.8 MG/ML ophthalmic solution Place 1 drop into both eyes in the morning and at bedtime.     ezetimibe (ZETIA) 10 MG tablet Take 1 tablet (10 mg total) by mouth daily. 90 tablet 3   fexofenadine (ALLEGRA) 180 MG tablet TAKE 1 TABLET (180 MG TOTAL) BY MOUTH DAILY. 90 tablet 1   flecainide (TAMBOCOR) 100 MG tablet TAKE 1 TABLET BY MOUTH TWICE A DAY 180 tablet 3   furosemide (LASIX) 40 MG tablet Take 40 mg by mouth as needed.     gabapentin (NEURONTIN) 100 MG capsule Take 1 capsule (100 mg total) by mouth 3 (three) times daily. 90 capsule 3   glipiZIDE (GLUCOTROL XL) 2.5 MG 24 hr tablet Take 1 tablet (2.5 mg total) by mouth daily with breakfast. 90 tablet 3   latanoprost (XALATAN) 0.005 % ophthalmic solution Place 1 drop into both eyes at bedtime.      meclizine (ANTIVERT) 12.5 MG tablet TAKE 1 TABLET BY MOUTH EVERY 8 HOURS AS NEEDED 30 tablet 5   metoprolol succinate (TOPROL XL) 25 MG 24 hr tablet Take 1 tablet (25 mg total) by mouth at bedtime. 90 tablet 2   nystatin (MYCOSTATIN) 100000 UNIT/ML suspension Take by mouth.     omeprazole (PRILOSEC) 40 MG capsule TAKE 1 CAPSULE BY MOUTH EVERY DAY AS NEEDED 90 capsule 2   psyllium (METAMUCIL) 0.52 g capsule Take 2 capsules by mouth daily.     telmisartan (MICARDIS) 20 MG tablet Take 1 tablet (20 mg total) by mouth at bedtime. Resume from 10/06/22     traMADol (ULTRAM) 50 MG tablet TAKE 1 TABLET BY MOUTH EVERY 6 HOURS AS NEEDED FOR PAIN 120 tablet  2   XARELTO 20 MG TABS tablet TAKE 1 TABLET BY MOUTH DAILY WITH SUPPER 90 tablet 3   pregabalin (LYRICA) 75 MG capsule Take 1 capsule (75 mg total) by mouth 2 (two) times daily. 60 capsule 0   tamsulosin (FLOMAX) 0.4 MG CAPS capsule TAKE 1 CAPSULE BY MOUTH TWICE A DAY 180 capsule 3   No facility-administered medications prior to visit.      Review of Systems  Review of Systems  Constitutional:  Positive for fatigue.  HENT: Negative.    Respiratory: Negative.    Cardiovascular: Negative.   Psychiatric/Behavioral:  Positive for sleep disturbance.      Physical Exam  BP 112/78 (BP Location: Left Arm, Patient Position: Sitting, Cuff Size: Large)   Pulse 61   Temp (!) 97.4 F (36.3 C) (Temporal)   Ht 5\' 9"  (1.753 m)   Wt 218 lb 6.4 oz (99.1 kg)  SpO2 93%   BMI 32.25 kg/m  Physical Exam Constitutional:      Appearance: Normal appearance.  HENT:     Head: Normocephalic and atraumatic.  Cardiovascular:     Rate and Rhythm: Normal rate and regular rhythm.     Comments: RRR Pulmonary:     Effort: Pulmonary effort is normal.     Breath sounds: Normal breath sounds.  Musculoskeletal:        General: Normal range of motion.  Neurological:     General: No focal deficit present.     Mental Status: He is alert and oriented to person, place, and time. Mental status is at baseline.  Psychiatric:        Mood and Affect: Mood normal.        Behavior: Behavior normal.        Thought Content: Thought content normal.        Judgment: Judgment normal.      Lab Results:  CBC    Component Value Date/Time   WBC 8.0 03/09/2023 0951   RBC 4.79 03/09/2023 0951   HGB 14.7 03/09/2023 0951   HGB 14.4 12/30/2022 1510   HCT 44.8 03/09/2023 0951   HCT 42.9 12/30/2022 1510   PLT 157.0 03/09/2023 0951   PLT 159 12/30/2022 1510   MCV 93.4 03/09/2023 0951   MCV 92 12/30/2022 1510   MCH 31.0 12/30/2022 1510   MCH 30.7 10/02/2022 0420   MCHC 32.7 03/09/2023 0951   RDW 15.4 03/09/2023  0951   RDW 15.0 12/30/2022 1510   LYMPHSABS 1.8 03/09/2023 0951   LYMPHSABS 2.1 09/18/2020 1549   MONOABS 1.1 (H) 03/09/2023 0951   EOSABS 0.1 03/09/2023 0951   EOSABS 0.1 09/18/2020 1549   BASOSABS 0.1 03/09/2023 0951   BASOSABS 0.1 09/18/2020 1549    BMET    Component Value Date/Time   NA 142 03/09/2023 0951   NA 146 (H) 12/30/2022 1510   K 4.3 03/09/2023 0951   CL 107 03/09/2023 0951   CO2 27 03/09/2023 0951   GLUCOSE 94 03/09/2023 0951   GLUCOSE 86 10/25/2006 0935   BUN 20 03/09/2023 0951   BUN 17 12/30/2022 1510   CREATININE 1.19 03/09/2023 0951   CREATININE 1.03 12/11/2015 0912   CALCIUM 9.5 03/09/2023 0951   GFRNONAA >60 10/02/2022 0420   GFRAA 89 09/18/2020 1549    BNP    Component Value Date/Time   BNP 11.9 05/18/2022 2306    ProBNP    Component Value Date/Time   PROBNP 91 12/30/2022 1510   PROBNP 19.0 06/01/2022 1004    Imaging: DG MYELOGRAPHY LUMBAR INJ LUMBOSACRAL  Result Date: 03/29/2023 CLINICAL DATA:  Chronic, progressively worsening low back pain radiating to both hips, worse on the left. No prior surgery. EXAM: LUMBAR MYELOGRAM CT LUMBAR MYELOGRAM FLUOROSCOPY: Radiation Exposure Index (as provided by the fluoroscopic device): 15.3 mGy Kerma PROCEDURE: After thorough discussion of risks and benefits of the procedure including bleeding, infection, injury to nerves, blood vessels, adjacent structures as well as headache and CSF leak, written and oral informed consent was obtained. Consent was obtained by Dr. Malachi Pro. Time out form was completed. Patient was positioned prone on the fluoroscopy table. Local anesthesia was provided with 1% lidocaine without epinephrine after prepped and draped in the usual sterile fashion. Puncture was performed at L2-L3 using a 3 1/2 inch 22-gauge spinal needle via left interlaminar approach. Using a single pass through the dura, the needle was placed within the thecal sac, with  return of clear CSF. 15 mL of Isovue  M-200 was injected into the thecal sac, with normal opacification of the nerve roots and cauda equina consistent with free flow within the subarachnoid space. I personally performed the lumbar puncture and administered the intrathecal contrast. I also personally supervised acquisition of the myelogram images. TECHNIQUE: Contiguous axial images were obtained through the Lumbar spine after the intrathecal infusion of contrast. Coronal and sagittal reconstructions were obtained of the axial image sets. COMPARISON:  Lumbar spine x-rays dated March 12, 2023. FINDINGS: LUMBAR MYELOGRAM FINDINGS: Straightening of the normal lumbar lordosis without significant listhesis. No dynamic instability. Small ventral extradural defects at L2-L3 and L3-L4. No high-grade spinal canal stenosis. Right lateral recess stenosis at L3-L4 with underfilling of the exiting right L4 nerve root. CT LUMBAR MYELOGRAM FINDINGS: Segmentation: Based on prior imaging, there is transitional thoracolumbar anatomy with hypoplastic ribs at L1. Alignment: Straightening of the normal lumbar lordosis. No listhesis. Vertebrae: No acute fracture or other focal pathologic process. Conus medullaris and cauda equina: Conus extends to the L2 level. Conus and cauda equina appear normal. Paraspinal and other soft tissues: Aortoiliac atherosclerotic vascular disease. 7 mm nonobstructive calculus in the lower pole of the left kidney. Disc levels: T11-T12: Mild disc bulging. Minimal right facet arthropathy. Mild spinal canal and bilateral neuroforaminal stenosis. T12-L1:  Minimal disc bulging.  No stenosis. L1-L2: Negative disc. Mild right and minimal left facet arthropathy. No stenosis. L2-L3: Mild disc bulging and bilateral facet arthropathy. Right far lateral disc osteophyte complex encroaches on the exited right L2 nerve root. No stenosis. L3-L4: Mild disc bulging with superimposed slightly down turning small right subarticular disc protrusion. Mild bilateral facet  arthropathy. Mild right lateral recess stenosis. Mild-to-moderate right neuroforaminal stenosis. No spinal canal or left neuroforaminal stenosis. L4-L5: Small circumferential disc osteophyte complex and mild-to-moderate bilateral facet arthropathy. Moderate right-greater-than-left neuroforaminal stenosis. No spinal canal stenosis. L5-S1: Circumferential disc osteophyte complex and mild bilateral facet arthropathy. Moderate right and moderate to severe left neuroforaminal stenosis. No spinal canal stenosis. IMPRESSION: 1. Multilevel lumbar spondylosis as described above. Moderate to severe left and moderate right neuroforaminal stenosis at L5-S1. 2. Moderate bilateral neuroforaminal stenosis L4-L5. 3. Nonobstructive left nephrolithiasis. 4.  Aortic Atherosclerosis (ICD10-I70.0). Electronically Signed   By: Obie Dredge M.D.   On: 03/29/2023 13:46   CT LUMBAR SPINE W CONTRAST  Result Date: 03/29/2023 CLINICAL DATA:  Chronic, progressively worsening low back pain radiating to both hips, worse on the left. No prior surgery. EXAM: LUMBAR MYELOGRAM CT LUMBAR MYELOGRAM FLUOROSCOPY: Radiation Exposure Index (as provided by the fluoroscopic device): 15.3 mGy Kerma PROCEDURE: After thorough discussion of risks and benefits of the procedure including bleeding, infection, injury to nerves, blood vessels, adjacent structures as well as headache and CSF leak, written and oral informed consent was obtained. Consent was obtained by Dr. Malachi Pro. Time out form was completed. Patient was positioned prone on the fluoroscopy table. Local anesthesia was provided with 1% lidocaine without epinephrine after prepped and draped in the usual sterile fashion. Puncture was performed at L2-L3 using a 3 1/2 inch 22-gauge spinal needle via left interlaminar approach. Using a single pass through the dura, the needle was placed within the thecal sac, with return of clear CSF. 15 mL of Isovue M-200 was injected into the thecal sac, with  normal opacification of the nerve roots and cauda equina consistent with free flow within the subarachnoid space. I personally performed the lumbar puncture and administered the intrathecal contrast. I also personally supervised  acquisition of the myelogram images. TECHNIQUE: Contiguous axial images were obtained through the Lumbar spine after the intrathecal infusion of contrast. Coronal and sagittal reconstructions were obtained of the axial image sets. COMPARISON:  Lumbar spine x-rays dated March 12, 2023. FINDINGS: LUMBAR MYELOGRAM FINDINGS: Straightening of the normal lumbar lordosis without significant listhesis. No dynamic instability. Small ventral extradural defects at L2-L3 and L3-L4. No high-grade spinal canal stenosis. Right lateral recess stenosis at L3-L4 with underfilling of the exiting right L4 nerve root. CT LUMBAR MYELOGRAM FINDINGS: Segmentation: Based on prior imaging, there is transitional thoracolumbar anatomy with hypoplastic ribs at L1. Alignment: Straightening of the normal lumbar lordosis. No listhesis. Vertebrae: No acute fracture or other focal pathologic process. Conus medullaris and cauda equina: Conus extends to the L2 level. Conus and cauda equina appear normal. Paraspinal and other soft tissues: Aortoiliac atherosclerotic vascular disease. 7 mm nonobstructive calculus in the lower pole of the left kidney. Disc levels: T11-T12: Mild disc bulging. Minimal right facet arthropathy. Mild spinal canal and bilateral neuroforaminal stenosis. T12-L1:  Minimal disc bulging.  No stenosis. L1-L2: Negative disc. Mild right and minimal left facet arthropathy. No stenosis. L2-L3: Mild disc bulging and bilateral facet arthropathy. Right far lateral disc osteophyte complex encroaches on the exited right L2 nerve root. No stenosis. L3-L4: Mild disc bulging with superimposed slightly down turning small right subarticular disc protrusion. Mild bilateral facet arthropathy. Mild right lateral recess  stenosis. Mild-to-moderate right neuroforaminal stenosis. No spinal canal or left neuroforaminal stenosis. L4-L5: Small circumferential disc osteophyte complex and mild-to-moderate bilateral facet arthropathy. Moderate right-greater-than-left neuroforaminal stenosis. No spinal canal stenosis. L5-S1: Circumferential disc osteophyte complex and mild bilateral facet arthropathy. Moderate right and moderate to severe left neuroforaminal stenosis. No spinal canal stenosis. IMPRESSION: 1. Multilevel lumbar spondylosis as described above. Moderate to severe left and moderate right neuroforaminal stenosis at L5-S1. 2. Moderate bilateral neuroforaminal stenosis L4-L5. 3. Nonobstructive left nephrolithiasis. 4.  Aortic Atherosclerosis (ICD10-I70.0). Electronically Signed   By: Obie Dredge M.D.   On: 03/29/2023 13:46     Assessment & Plan:   OSA (obstructive sleep apnea) - Patient has symptoms of daytime sleepiness/fatigue. Epworth 10. He has no issues falling asleep but reports waking up 3-4 times night. Split-night sleep study on 01/27/23 showed evidence of severe OSA, AHI 58.9/hour. He underwent CPAP titration study on 03/01/23, apneas were corrected with pressure 9cm h20.  Reviewed results with patient, he is open to starting CPAP therapy.  Advised he aim to wear CPAP nightly 4-6 hours or longer. Consider sleep aid if patient has difficulty sleeping. Encourage weight loss efforts. Advised caution with alcohol use or sedative prior to bedtime. FU 31-90 days after starting CPAP for compliance check.   Glenford Bayley, NP 04/14/2023

## 2023-04-14 NOTE — Assessment & Plan Note (Addendum)
-   Patient has symptoms of daytime sleepiness/fatigue. Epworth 10. He has no issues falling asleep but reports waking up 3-4 times night. Split-night sleep study on 01/27/23 showed evidence of severe OSA, AHI 58.9/hour. He underwent CPAP titration study on 03/01/23, apneas were corrected with pressure 9cm h20.  Reviewed results with patient, he is open to starting CPAP therapy.  Advised he aim to wear CPAP nightly 4-6 hours or longer. Consider sleep aid if patient has difficulty sleeping. Encourage weight loss efforts. Advised caution with alcohol use or sedative prior to bedtime. FU 31-90 days after starting CPAP for compliance check.

## 2023-04-14 NOTE — Patient Instructions (Addendum)
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 Wynona Neat or Waynetta Sandy NP for compliance check   CPAP and BIPAP Information CPAP and BIPAP are methods that use air pressure to keep your airways open and to help you breathe well. CPAP and BIPAP use different amounts of pressure. Your health care provider will tell you whether CPAP or BIPAP would be more helpful for you. CPAP stands for "continuous positive airway pressure." With CPAP, the amount of pressure stays the same while you breathe in (inhale) and out (exhale). BIPAP stands for "bi-level positive airway pressure." With BIPAP, the amount of pressure will be higher when you inhale and lower when you exhale. This allows you to take larger breaths. CPAP or BIPAP may be used in the hospital, or your health care provider may want you to use it at home. You may need to have a sleep study before your health care provider can order a machine for you to use at home. What are the advantages? CPAP or BIPAP can be helpful if you have: Sleep apnea. Chronic obstructive pulmonary disease (COPD). Heart failure. Medical conditions that cause muscle weakness, including muscular dystrophy or amyotrophic lateral sclerosis (ALS). Other problems that cause breathing to be shallow, weak, abnormal, or difficult. CPAP and BIPAP are most commonly used for obstructive sleep apnea (OSA) to keep the airways from collapsing when the muscles relax during sleep. What are the risks? Generally, this is a safe treatment. However, problems may occur, including: Irritated skin or skin sores if the mask does not fit properly. Dry or stuffy nose or nosebleeds. Dry mouth. Feeling gassy or bloated. Sinus or lung infection if the equipment  is not cleaned properly. When should CPAP or BIPAP be used? In most cases, the mask only needs to be worn during sleep. Generally, the mask needs to be worn throughout the night and during any daytime naps. People with certain medical conditions may also need to wear the mask at other times, such as when they are awake. Follow instructions from your health care provider about when to use the machine. What happens during CPAP or BIPAP?  Both CPAP and BIPAP are provided by a small machine with a flexible plastic tube that attaches to a plastic mask that you wear. Air is blown through the mask into your nose or mouth. The amount of pressure that is used to blow the air can be adjusted on the machine. Your health care provider will set the pressure setting and help you find the best mask for you. Tips for using the mask Because the mask needs to be snug, some people feel trapped or closed-in (claustrophobic) when first using the mask. If you feel this way, you may need to get used to the mask. One way to do this is to hold the mask loosely over your nose or mouth and then gradually apply the mask more snugly. You can also gradually increase the amount of time that you use the mask. Masks are available in various types and sizes. If your mask does not fit well, talk with your health care provider about getting a different one. Some common types of masks include: Full face masks, which fit over the mouth and nose. Nasal masks, which fit over the nose.  Nasal pillow or prong masks, which fit into the nostrils. If you are using a mask that fits over your nose and you tend to breathe through your mouth, a chin strap may be applied to help keep your mouth closed. Use a skin barrier to protect your skin as told by your health care provider. Some CPAP and BIPAP machines have alarms that may sound if the mask comes off or develops a leak. If you have trouble with the mask, it is very important that you talk with  your health care provider about finding a way to make the mask easier to tolerate. Do not stop using the mask. There could be a negative impact on your health if you stop using the mask. Tips for using the machine Place your CPAP or BIPAP machine on a secure table or stand near an electrical outlet. Know where the on/off switch is on the machine. Follow instructions from your health care provider about how to set the pressure on your machine and when you should use it. Do not eat or drink while the CPAP or BIPAP machine is on. Food or fluids could get pushed into your lungs by the pressure of the CPAP or BIPAP. For home use, CPAP and BIPAP machines can be rented or purchased through home health care companies. Many different brands of machines are available. Renting a machine before purchasing may help you find out which particular machine works well for you. Your health insurance company may also decide which machine you may get. Keep the CPAP or BIPAP machine and attachments clean. Ask your health care provider for specific instructions. Check the humidifier if you have a dry stuffy nose or nosebleeds. Make sure it is working correctly. Follow these instructions at home: Take over-the-counter and prescription medicines only as told by your health care provider. Ask if you can take sinus medicine if your sinuses are blocked. Do not use any products that contain nicotine or tobacco. These products include cigarettes, chewing tobacco, and vaping devices, such as e-cigarettes. If you need help quitting, ask your health care provider. Keep all follow-up visits. This is important. Contact a health care provider if: You have redness or pressure sores on your head, face, mouth, or nose from the mask or head gear. You have trouble using the CPAP or BIPAP machine. You cannot tolerate wearing the CPAP or BIPAP mask. Someone tells you that you snore even when wearing your CPAP or BIPAP. Get help right away  if: You have trouble breathing. You feel confused. Summary CPAP and BIPAP are methods that use air pressure to keep your airways open and to help you breathe well. If you have trouble with the mask, it is very important that you talk with your health care provider about finding a way to make the mask easier to tolerate. Do not stop using the mask. There could be a negative impact to your health if you stop using the mask. Follow instructions from your health care provider about when to use the machine. This information is not intended to replace advice given to you by your health care provider. Make sure you discuss any questions you have with your health care provider. Document Revised: 06/11/2021 Document Reviewed: 10/11/2020 Elsevier Patient Education  2023 [employer].

## 2023-04-16 ENCOUNTER — Telehealth: Payer: Self-pay | Admitting: Physical Medicine and Rehabilitation

## 2023-04-16 NOTE — Telephone Encounter (Signed)
Spoke with patient and scheduled injection for 04/28/23. Patient aware driver needed

## 2023-04-16 NOTE — Telephone Encounter (Signed)
Patient called returning a call to schedule with Dr. Newton.  

## 2023-04-20 ENCOUNTER — Ambulatory Visit (INDEPENDENT_AMBULATORY_CARE_PROVIDER_SITE_OTHER): Payer: Medicare Other

## 2023-04-20 DIAGNOSIS — I480 Paroxysmal atrial fibrillation: Secondary | ICD-10-CM

## 2023-04-20 LAB — CUP PACEART REMOTE DEVICE CHECK
Battery Remaining Longevity: 156 mo
Battery Remaining Percentage: 100 %
Brady Statistic RA Percent Paced: 100 %
Brady Statistic RV Percent Paced: 1 %
Date Time Interrogation Session: 20240604025100
Implantable Lead Connection Status: 753985
Implantable Lead Connection Status: 753985
Implantable Lead Implant Date: 20100719
Implantable Lead Implant Date: 20211130
Implantable Lead Location: 753859
Implantable Lead Location: 753860
Implantable Lead Model: 4136
Implantable Lead Model: 7842
Implantable Lead Serial Number: 1055910
Implantable Lead Serial Number: 28594836
Implantable Pulse Generator Implant Date: 20211130
Lead Channel Impedance Value: 617 Ohm
Lead Channel Impedance Value: 728 Ohm
Lead Channel Setting Pacing Amplitude: 2 V
Lead Channel Setting Pacing Amplitude: 2.5 V
Lead Channel Setting Pacing Pulse Width: 1 ms
Lead Channel Setting Sensing Sensitivity: 2.5 mV
Pulse Gen Serial Number: 951809
Zone Setting Status: 755011

## 2023-04-26 ENCOUNTER — Other Ambulatory Visit: Payer: Self-pay | Admitting: Pulmonary Disease

## 2023-04-26 ENCOUNTER — Other Ambulatory Visit: Payer: Self-pay | Admitting: Internal Medicine

## 2023-04-26 ENCOUNTER — Ambulatory Visit: Payer: Self-pay

## 2023-04-26 NOTE — Patient Outreach (Signed)
  Care Coordination   Follow Up Visit Note   04/26/2023 Name: [name] MRN: [id number] DOB: 1941-02-17  [name] is a 82 y.o. year old male who sees Corwin Levins, MD for primary care. I spoke with  [name] by phone today.  What matters to the patients health and wellness today?  [name] is requesting in home care assistance resources. He continues to follow up with orthopedic providers regarding back pain.  Goals Addressed             This Visit's Progress    Assist with health management       Interventions Today    Flowsheet Row Most Recent Value  Chronic Disease   Chronic disease during today's visit Hypertension (HTN), Congestive Heart Failure (CHF), Other  [OSA]  General Interventions   General Interventions Discussed/Reviewed General Interventions Reviewed  Exercise Interventions   Exercise Discussed/Reviewed Physical Activity  [encouraged to remain as active as tolerated]  Physical Activity Discussed/Reviewed Physical Activity Discussed  Education Interventions   Education Provided Provided Education  Provided Verbal Education On Other  [discussed OSA and importance of wearing CPAP machine]  Nutrition Interventions   Nutrition Discussed/Reviewed Nutrition Reviewed  Safety Interventions   Safety Discussed/Reviewed Safety Reviewed            SDOH assessments and interventions completed:  No  Care Coordination Interventions:  Yes, provided   Follow up plan: Follow up call scheduled for 05/18/23    Encounter Outcome:  Pt. Visit Completed   Kathyrn Sheriff, RN, MSN, BSN, CCM [facility] Care Coordinator [phone]

## 2023-04-26 NOTE — Patient Instructions (Signed)
Visit Information  Thank you for taking time to visit with me today. Please don't hesitate to contact me if I can be of assistance to you.   Following are the goals we discussed today:  Continue to take medications as prescribed Continue to attend provider visits as scheduled Contact pulmonologist office if you do not received a call from [facility] regarding CPAP within the next week. Contact RNCM if care coordination or resource needs.   Our next appointment is by telephone on 05/18/23 at 9:30 am  Please call the care guide team at [phone] if you need to cancel or reschedule your appointment.   If you are experiencing a Mental Health or Behavioral Health Crisis or need someone to talk to, please call the Suicide and Crisis Lifeline: 55  Kathyrn Sheriff, RN, MSN, BSN, CCM [facility] Care coordinator [phone]

## 2023-04-27 DIAGNOSIS — R404 Transient alteration of awareness: Secondary | ICD-10-CM | POA: Diagnosis not present

## 2023-04-27 DIAGNOSIS — R402431 Glasgow coma scale score 3-8, in the field [EMT or ambulance]: Secondary | ICD-10-CM | POA: Diagnosis not present

## 2023-04-27 DIAGNOSIS — R55 Syncope and collapse: Secondary | ICD-10-CM | POA: Diagnosis not present

## 2023-04-27 DIAGNOSIS — R1111 Vomiting without nausea: Secondary | ICD-10-CM | POA: Diagnosis not present

## 2023-04-28 ENCOUNTER — Encounter: Payer: Medicare Other | Admitting: Physical Medicine and Rehabilitation

## 2023-04-30 ENCOUNTER — Encounter: Payer: Self-pay | Admitting: Internal Medicine

## 2023-04-30 NOTE — Telephone Encounter (Signed)
error 

## 2023-05-10 ENCOUNTER — Encounter: Payer: Medicare Other | Admitting: Licensed Clinical Social Worker

## 2023-05-12 NOTE — Progress Notes (Signed)
Remote pacemaker transmission.   

## 2023-05-17 DEATH — deceased

## 2023-05-21 ENCOUNTER — Ambulatory Visit: Payer: Medicare Other | Admitting: Internal Medicine

## 2023-06-04 ENCOUNTER — Encounter: Payer: Medicare Other | Admitting: Cardiovascular Disease

## 2023-06-08 ENCOUNTER — Other Ambulatory Visit: Payer: Self-pay | Admitting: Internal Medicine

## 2023-07-01 ENCOUNTER — Other Ambulatory Visit: Payer: Medicare Other

## 2023-07-07 ENCOUNTER — Ambulatory Visit: Payer: Medicare Other | Admitting: Internal Medicine

## 2023-08-03 ENCOUNTER — Encounter (HOSPITAL_COMMUNITY): Payer: Medicare Other

## 2023-08-03 ENCOUNTER — Ambulatory Visit: Payer: Medicare Other | Admitting: Vascular Surgery

## 2024-01-14 ENCOUNTER — Ambulatory Visit: Payer: Medicare Other | Admitting: Neurology
